# Patient Record
Sex: Male | Born: 1950 | Race: White | Hispanic: No | Smoking: Former smoker
Health system: Southern US, Community
[De-identification: ages and names within clinical notes are randomized; demographics above are authoritative.]

## PROBLEM LIST (undated history)

## (undated) DIAGNOSIS — R112 Nausea with vomiting, unspecified: Secondary | ICD-10-CM

## (undated) DIAGNOSIS — I4819 Other persistent atrial fibrillation: Secondary | ICD-10-CM

## (undated) DIAGNOSIS — R059 Cough, unspecified: Secondary | ICD-10-CM

## (undated) DIAGNOSIS — I25119 Atherosclerotic heart disease of native coronary artery with unspecified angina pectoris: Secondary | ICD-10-CM

## (undated) DIAGNOSIS — R9431 Abnormal electrocardiogram [ECG] [EKG]: Secondary | ICD-10-CM

## (undated) DIAGNOSIS — I5042 Chronic combined systolic (congestive) and diastolic (congestive) heart failure: Secondary | ICD-10-CM

## (undated) DIAGNOSIS — K219 Gastro-esophageal reflux disease without esophagitis: Secondary | ICD-10-CM

## (undated) DIAGNOSIS — E785 Hyperlipidemia, unspecified: Secondary | ICD-10-CM

## (undated) DIAGNOSIS — E669 Obesity, unspecified: Secondary | ICD-10-CM

## (undated) DIAGNOSIS — M1990 Unspecified osteoarthritis, unspecified site: Secondary | ICD-10-CM

## (undated) DIAGNOSIS — I255 Ischemic cardiomyopathy: Secondary | ICD-10-CM

## (undated) DIAGNOSIS — G459 Transient cerebral ischemic attack, unspecified: Secondary | ICD-10-CM

## (undated) DIAGNOSIS — G4733 Obstructive sleep apnea (adult) (pediatric): Secondary | ICD-10-CM

## (undated) DIAGNOSIS — R05 Cough: Secondary | ICD-10-CM

## (undated) DIAGNOSIS — I2510 Atherosclerotic heart disease of native coronary artery without angina pectoris: Secondary | ICD-10-CM

## (undated) DIAGNOSIS — Z7901 Long term (current) use of anticoagulants: Secondary | ICD-10-CM

## (undated) DIAGNOSIS — I714 Abdominal aortic aneurysm, without rupture: Secondary | ICD-10-CM

## (undated) DIAGNOSIS — Z9889 Other specified postprocedural states: Secondary | ICD-10-CM

## (undated) DIAGNOSIS — I119 Hypertensive heart disease without heart failure: Secondary | ICD-10-CM

## (undated) DIAGNOSIS — I10 Essential (primary) hypertension: Secondary | ICD-10-CM

## (undated) DIAGNOSIS — E032 Hypothyroidism due to medicaments and other exogenous substances: Secondary | ICD-10-CM

## (undated) DIAGNOSIS — Z951 Presence of aortocoronary bypass graft: Secondary | ICD-10-CM

## (undated) DIAGNOSIS — M109 Gout, unspecified: Secondary | ICD-10-CM

## (undated) DIAGNOSIS — I4821 Permanent atrial fibrillation: Secondary | ICD-10-CM

## (undated) DIAGNOSIS — F419 Anxiety disorder, unspecified: Secondary | ICD-10-CM

## (undated) DIAGNOSIS — N200 Calculus of kidney: Secondary | ICD-10-CM

## (undated) HISTORY — DX: Ischemic cardiomyopathy: I25.5

## (undated) HISTORY — PX: TRANSTHORACIC ECHOCARDIOGRAM: SHX275

## (undated) HISTORY — DX: Essential (primary) hypertension: I10

## (undated) HISTORY — DX: Atherosclerotic heart disease of native coronary artery without angina pectoris: I25.10

## (undated) HISTORY — DX: Hyperlipidemia, unspecified: E78.5

## (undated) HISTORY — DX: Chronic combined systolic (congestive) and diastolic (congestive) heart failure: I50.42

## (undated) HISTORY — PX: CATARACT EXTRACTION: SUR2

## (undated) HISTORY — DX: Permanent atrial fibrillation: I48.21

---
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
## 2004-07-18 ENCOUNTER — Emergency Department (HOSPITAL_COMMUNITY): Admission: EM | Admit: 2004-07-18 | Discharge: 2004-07-18 | Payer: Self-pay | Admitting: Emergency Medicine

## 2006-02-04 ENCOUNTER — Inpatient Hospital Stay (HOSPITAL_COMMUNITY): Admission: EM | Admit: 2006-02-04 | Discharge: 2006-02-07 | Payer: Self-pay | Admitting: Emergency Medicine

## 2006-02-06 ENCOUNTER — Encounter: Payer: Self-pay | Admitting: *Deleted

## 2006-02-21 ENCOUNTER — Ambulatory Visit: Payer: Self-pay | Admitting: Internal Medicine

## 2006-07-18 ENCOUNTER — Ambulatory Visit: Payer: Self-pay | Admitting: Internal Medicine

## 2006-07-19 ENCOUNTER — Ambulatory Visit: Payer: Self-pay | Admitting: Internal Medicine

## 2006-07-20 ENCOUNTER — Ambulatory Visit: Payer: Self-pay | Admitting: Internal Medicine

## 2006-07-25 ENCOUNTER — Ambulatory Visit: Payer: Self-pay | Admitting: Internal Medicine

## 2006-09-12 ENCOUNTER — Ambulatory Visit: Payer: Self-pay | Admitting: Internal Medicine

## 2006-09-12 LAB — CONVERTED CEMR LAB
ALT: 19 units/L (ref 0–40)
BUN: 12 mg/dL (ref 6–23)
Basophils Relative: 0.5 % (ref 0.0–1.0)
CO2: 25 meq/L (ref 19–32)
Calcium: 9.3 mg/dL (ref 8.4–10.5)
Creatinine, Ser: 1 mg/dL (ref 0.4–1.5)
Eosinophil percent: 2.3 % (ref 0.0–5.0)
GFR calc non Af Amer: 82 mL/min
Glomerular Filtration Rate, Af Am: 99 mL/min/{1.73_m2}
Glucose, Bld: 99 mg/dL (ref 70–99)
HDL: 36.5 mg/dL — ABNORMAL LOW (ref >39.0)
Hemoglobin: 14.6 g/dL (ref 13.0–17.0)
Lymphocytes Relative: 27.3 % (ref 12.0–46.0)
MCHC: 34.3 g/dL (ref 30.0–36.0)
Monocytes Absolute: 0.6 10*3/uL (ref 0.2–0.7)
Platelets: 273 10*3/uL (ref 150–400)
RBC: 4.89 M/uL (ref 4.22–5.81)
WBC: 6.8 10*3/uL (ref 4.5–10.5)

## 2007-05-03 ENCOUNTER — Emergency Department (HOSPITAL_COMMUNITY): Admission: EM | Admit: 2007-05-03 | Discharge: 2007-05-03 | Payer: Self-pay | Admitting: Emergency Medicine

## 2007-09-17 ENCOUNTER — Encounter: Payer: Self-pay | Admitting: Internal Medicine

## 2007-11-22 ENCOUNTER — Ambulatory Visit: Payer: Self-pay | Admitting: Internal Medicine

## 2007-11-22 DIAGNOSIS — I119 Hypertensive heart disease without heart failure: Secondary | ICD-10-CM

## 2007-11-22 DIAGNOSIS — K219 Gastro-esophageal reflux disease without esophagitis: Secondary | ICD-10-CM

## 2007-11-22 DIAGNOSIS — E785 Hyperlipidemia, unspecified: Secondary | ICD-10-CM | POA: Insufficient documentation

## 2007-11-22 HISTORY — DX: Gastro-esophageal reflux disease without esophagitis: K21.9

## 2007-11-23 ENCOUNTER — Ambulatory Visit: Payer: Self-pay | Admitting: *Deleted

## 2007-12-10 ENCOUNTER — Encounter (INDEPENDENT_AMBULATORY_CARE_PROVIDER_SITE_OTHER): Payer: Self-pay | Admitting: Internal Medicine

## 2008-01-04 ENCOUNTER — Ambulatory Visit: Payer: Self-pay | Admitting: Internal Medicine

## 2008-01-19 ENCOUNTER — Encounter (INDEPENDENT_AMBULATORY_CARE_PROVIDER_SITE_OTHER): Payer: Self-pay | Admitting: Internal Medicine

## 2011-04-22 NOTE — Assessment & Plan Note (Signed)
[facility] OFFICE NOTE   Charles Mckinney, Charles Mckinney                     MRN:          [id number]  DATE:07/18/2006                            DOB:          02-14-1951    Patient is seen today complaining of an enlarging, painful mass involving  his left mid back area.  He has had an infected sebaceous cyst in the past.   Examination revealed a golf-ball-sized inflammatory mass involving the left  mid back area, surrounded by a 10 cm diameter area of erythema.  After local  anesthesia with 1% Xylocaine and skin prep, an incision was made in the  central inflammatory mass of approximately 2 cm.  With blunt dissection,  this entire area was vigorously drained and decompressed.  A copious amount  of pus was expressed.  Iodoform packing was placed in the wound, and the  wound dressed after antibiotic ointment applied.   IMPRESSION:  Large infected sebaceous cyst abscess, left mid back area.   DISPOSITION:  The patient is to return in 24 hours to rebandage the area,  another 48 hours to remove the packing.                                   [name], MD   PFK/MedQ  DD:  07/18/2006  DT:  07/18/2006  Job #:  [id number]

## 2011-04-22 NOTE — Consult Note (Signed)
Charles Mckinney, [name] NO.:  [id number]   MEDICAL RECORD NO.:  [id number]          PATIENT TYPE:  INP   LOCATION:  1409                         FACILITY:  [facility]   PHYSICIAN:  Meade Maw, M.D.    DATE OF BIRTH:  1951/05/27   DATE OF CONSULTATION:  02/05/2006  DATE OF DISCHARGE:                                   CONSULTATION   REFERRED BY:  Jackie Plum, M.D.   INDICATIONS FOR PROCEDURE:  Chest pain.   HISTORY:  Charles Mckinney is a very pleasant 60 year old male who presented  to the emergency room on March 3 with complaints of chest pain. It was  described as a bandlike sensation around his upper abdomen and was severe in  nature. He had no associated symptoms. The patient was walking at the time.  He felt that it was related to indigestion and he tried a couple of drinks  of soda drink without significant improvement. The pain improved in the  emergency room with IV nitroglycerin and morphine. His coronary risk factors  are significant for male sex, hypertension, strong family history and father  passed of myocardial infarction at 47, brother had myocardial infarction at  age 4, sister with myocardial infarction at age 66. Of note, is brothers  and sisters were smokers. Cholesterol status is unknown at this time. The  patient has had no fevers or chills or shortness of breath or cough.   PAST MEDICAL HISTORY:  1.  Hypertension.  2.  Noncompliance secondary to financial issues.   ALLERGIES:  PENICILLIN.   PAST SURGICAL HISTORY:  None.   SOCIAL HISTORY:  The patient occasionally drinks alcohol, he is widowed, his  wife passed in August from myocardial infarction. He has older children. He  works as a Electrical engineer for [employer].   REVIEW OF SYSTEMS:  Negative for change in bowel or bladder habits. No  headaches, no cough, no fevers, no chills, no presyncope or syncope, no  palpitations, no tachy arrhythmias. No orthopnea, no pedal edema.   PHYSICAL EXAMINATION:  GENERAL:  Reveals a middle-aged male appearing his  stated age.  VITAL SIGNS:  His blood pressure initially was 201/121 at presentation. His  O2 sat at his initial presentation was 88%. His current vitals reveal a  blood pressure of 162/89, heart rate of 84. He is afebrile. His O2 sat on  room air is 95%, his weight is 301 pounds.  HEENT:  Unremarkable.  NECK:  He has good carotid upstrokes. There is no carotid bruits.  There is  no neck vein distention.  PULMONARY:  Reveals breath sounds which are equal and clear to auscultation.  No use of accessory muscles.  CARDIOVASCULAR:  Reveals a regular rate and rhythm. Normal S1, normal S2. No  rubs, murmurs or gallops noted.  ABDOMEN:  Morbidly obese, no unusual bruits or pulsations is noted.  EXTREMITIES:  Reveal no peripheral edema.   His chest x-ray was not acute.   LABORATORY DATA:  His white count was 7.8, hemoglobin 14.2, hematocrit 41,  his platelet count 291. His sodium is 139, potassium 3.6,  chloride 106, CO2  is 25, BUN 11, creatinine is 1.1. His serial cardiac enzymes have been  negative. His troponin I has been less than 1. His lipid profile reveals a  triglyceride of 393. Cholesterol, HDL is 33, LDL is 133. ECG reveals a  normal sinus rhythm. There is intraventricular conduction  delay consistent  with right bundle branch block. There is no ischemic changes noted.   IMPRESSION:  83.  A 60 year old gentleman with significant coronary risk factors as noted      above. He has had no further chest pain since his admission. It is most      likely that his chest pain was initiated by his hypertension. In view of      his risk factors, we will schedule for a stress Cardiolite for further      evaluation. I agree with your ongoing therapy at this time of aspirin at      325 mg.  2.  Hypertension. The patient currently is on Lopressor at 25 mg b.i.d. and      Altace at 2.5 mg daily. Blood pressure is marginally  controlled. Would      consider restarting lisinopril as the patient will not be able to afford      Altace as an outpatient.  3.  Dyslipidemia. The patient's triglycerides are significantly elevated. He      will need a nutritional consult for further management of his      hypertriglyceridemia and his morbid obesity.   Thank you for allowing me to participate in the care of your patient. I will  discuss the patient further with you.      Meade Maw, M.D.  Electronically Signed     HP/MEDQ  D:  02/05/2006  T:  02/06/2006  Job:  [id number]

## 2011-04-22 NOTE — H&P (Signed)
Charles Mckinney, Charles Mckinney              ACCOUNT NO.:  [id number]   MEDICAL RECORD NO.:  [id number]          PATIENT TYPE:  INP   LOCATION:  1409                         [facility]ACILITY:  [facility]   PHYSICIAN:  Jackie Plum, M.D.DATE OF BIRTH:  1951-09-21   DATE OF ADMISSION:  02/04/2006  DATE OF DISCHARGE:                                HISTORY & PHYSICAL   CHIEF COMPLAINT:  Chest pain.   HISTORY:  The patient is a 60 year old Caucasian gentleman with history of  obesity and hypertension and history  positive for heart disease in his  brother and father, who presented with band-like pain involving the upper  abdominal area substernally with nausea without vomiting. He has had no  diaphoresis or shortness of breath. No known aggravating factor. Apparently  he took some soda drink and there was no significant improvement. He has  never had chest pain or heart condition previously. He denies any history of  fever or chills, shortness of breath, cough, sputum production, ankle or  calf pain or swelling. No dysuria, frequency, micturition.   PAST MEDICAL HISTORY:  As noted above. The patient does not have a primary  care physician.   ALLERGIES:  PENICILLIN.   MEDICATIONS:  He is on no medication constantly. He used to be on a blood  pressure pill but has stopped the medication because of loss of  affordability.   SOCIAL HISTORY:  He drinks alcohol. Patient is a widower. He has older  children who do not live with him. 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   REVIEW OF SYSTEMS:  As noted above.   PHYSICAL EXAMINATION:  VITAL SIGNS:  Temperature was 98.4. Blood pressure  was up to 201/121 on presentation but has improved, at time of my evaluation  it has come down as low as 169/95. Pulse 83. Oxygen saturation on room air  88%.  GENERAL:  The patient was in acute cardiopulmonary distress. He was  moderately obese.  HEENT:  Normocephalic, atraumatic. Pupils are equal, round  and reactive to  light. Extraocular movements are intact. Oropharynx moist.  NECK:  Supple, No JVD. No carotid bruits.  LUNGS:  Clear to auscultation.  CARDIAC:  Regular, no gallop.  ABDOMEN:  Soft, bowel sounds present.  EXTREMITIES:  No clubbing, cyanosis or edema.   X-ray was negative for any edema or infiltrate. There was cardiomegaly. A 12-  lead EKG did not show any acute ST wave changes, showed sinus rhythm at 85  beats per minute.   LABORATORY DATA:  CBC was essentially within normal limits. Chemistries  positive for a glucose of 114, otherwise within normal limits. Normal  amylase and lipase. First set of enzymes was negative for myocardial  infarction.  Urinalysis was negative for urinary tract infection.   The patient is admitted for evaluation and management of chest pain.   PLAN:  Start patient on aspirin and low-dose beta blocker and titrate up for  tight blood pressure control for his elevated hypertension. The patient will  be made n.p.o. Anticipation of stress-test hopefully for Monday, February 06, 2006.  Dictated by Julio Sicks, M.D.      Jackie Plum, M.D.  Electronically Signed     GO/MEDQ  D:  02/05/2006  T:  02/05/2006  Job:  [id number]

## 2011-04-22 NOTE — Discharge Summary (Signed)
Charles Mckinney, Charles Mckinney              ACCOUNT NO.:  [id number]   MEDICAL RECORD NO.:  [id number]          PATIENT TYPE:  INP   LOCATION:  1409                         FACILITY:  [facility]   PHYSICIAN:  Kela Millin, M.D.DATE OF BIRTH:  04/27/1951   DATE OF ADMISSION:  02/04/2006  DATE OF DISCHARGE:  02/07/2006                                 DISCHARGE SUMMARY   DISCHARGE DIAGNOSES:  1.  Chest pain--likely secondary to uncontrolled hypertension, per      cardiology. Stress Cardiolite negative for ischemia.  2.  Malignant hypertension.  3.  Dyslipidemia.  4.  History of noncompliance.   CONSULTATIONS:  Cardiology--Dr. Fraser Din.   HISTORY:  The patient is a 60 year old male who presented with complaints of  chest pain. He described it as a band-like sensation around his upper  abdomen as very severe in intensity. He had no other associated symptoms. He  reported that he walking at that time. The patient stated that he thought  that it was related to indigestion and he tried a couple of drinks of soda  without any significant improvement. The pain improved in the ER after he  was put on IV nitroglycerin and morphine. It was noted that his risk factors  for cardiac disease were his male sex, hypertension, strong family history--  his father is deceased from an MI at age 23, brother had an MI at age 65,  sister had an MI at age 51. It was also noted that his brother and sisters  were smokers.   His physical exam upon admission is as per Dr. Julio Sicks revealed a blood  pressure of 201/121 at presentation and current was 162/89, pulse 84,  afebrile, O2 saturation 95%. In general, he was noted to be obese. His lung  was clear to auscultation bilaterally, with no use of accessory muscles.  Cardiovascular revealed a regular rate and rhythm with normal S1 and S2. No  murmur, S3, or gallops noted. His abdomen was obese, otherwise benign. The  rest of his physical exam was noted to be within  normal limits.   On the laboratory data his white blood cell count was 7.8, hemoglobin 14.2,  hematocrit 41, platelet count 291,000. Sodium 139, potassium 3.6, chloride  106, CO2 25, BUN 11, creatinine 1.1, and cardiac enzymes were negative. His  triglycerides were 393 with HDL of 33, LDL 133.   EKG revealed normal sinus rhythm with an interventricular conduction delay  consistent with right bundle branch block noted. No ischemic changes noted.   HOSPITAL COURSE:  Problem #1:  Chest pain--on admission the patient had  serial cardiac enzymes done. He was maintained on an aspirin as well as  Lopressor and Altace. Cardiology was consulted and Dr. Lindell Spar impression  was that his chest pain was most likely initiated by his poorly controlled  hypertension. A stress test was done and it showed an ejection fraction of  57% with no ischemia. The patient was discharged home on aspirin,  metoprolol, and Prilosec.   Problem #2:  Hypertension--the patient's blood pressure medications were  adjusted for better blood pressure control during his  hospital stay. His  blood pressure subsequently improved and he was discharged on metoprolol 50  mg daily.   Problem #3:  Dyslipidemia--nutrition was consulted during the patient's  hospital stay. His total cholesterol was 245. He was placed on Pravachol 20  mg p.o. q.h.s.   DISCHARGE MEDICATIONS:  1.  Aspirin 81 mg p.o. daily.  2.  Metoprolol 50 mg p.o. b.i.d.  3.  Pravachol 20 mg p.o. q.h.s.  4.  Prilosec 20 mg p.o. daily.   FOLLOWUP CARE:  Primary care physician in two to four weeks.   DISCHARGE CONDITION:  Improved/stable.      Kela Millin, M.D.  Electronically Signed     ACV/MEDQ  D:  05/03/2006  T:  05/03/2006  Job:  [id number]

## 2012-09-03 ENCOUNTER — Ambulatory Visit (INDEPENDENT_AMBULATORY_CARE_PROVIDER_SITE_OTHER): Payer: Self-pay | Admitting: Internal Medicine

## 2012-09-03 ENCOUNTER — Encounter: Payer: Self-pay | Admitting: Internal Medicine

## 2012-09-03 VITALS — BP 148/80 | HR 62 | Temp 98.5°F | Resp 18 | Ht 70.5 in | Wt 304.0 lb

## 2012-09-03 DIAGNOSIS — I219 Acute myocardial infarction, unspecified: Secondary | ICD-10-CM

## 2012-09-03 DIAGNOSIS — E6609 Other obesity due to excess calories: Secondary | ICD-10-CM

## 2012-09-03 DIAGNOSIS — E669 Obesity, unspecified: Secondary | ICD-10-CM

## 2012-09-03 DIAGNOSIS — Z0000 Encounter for general adult medical examination without abnormal findings: Secondary | ICD-10-CM

## 2012-09-03 MED ORDER — PRAVASTATIN SODIUM 40 MG PO TABS: 40.0000 mg | ORAL_TABLET | Freq: Every day | ORAL | Status: DC

## 2012-09-03 MED ORDER — METOPROLOL SUCCINATE ER 50 MG PO TB24: 50.0000 mg | ORAL_TABLET | Freq: Every day | ORAL | Status: DC

## 2012-09-03 MED ORDER — ASPIRIN 81 MG PO TBEC: 81.0000 mg | DELAYED_RELEASE_TABLET | Freq: Every day | ORAL | Status: DC

## 2012-09-03 NOTE — Patient Instructions (Signed)
Limit your sodium (Salt) intake    It is important that you exercise regularly, at least 20 minutes 3 to 4 times per week.  If you develop chest pain or shortness of breath seek  medical attention.  You need to lose weight.  Consider a lower calorie diet and regular exercise.  Schedule your colonoscopy to help detect colon cancer.  Return in 6 months for follow-up

## 2012-09-03 NOTE — Progress Notes (Signed)
Subjective:    Patient ID: Charles Mckinney, male    DOB: 08-Feb-1951, 61 y.o.   MRN: [id number]  HPI  61 year old patient who is seen today to establish with this practice. He is a former patient that has been followed at the [facility] hospital system. He has a history of hypertension and dyslipidemia. 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 Review of hospital records revealed that he had accelerated hypertension and has suspected angina but no MI. Subsequent stress test revealed no ischemia No concerns or complaints today. Past medical history is pertinent also for gastroesophageal reflux disease. He takes OTC Prilosec when necessary.  Family history father died age 54 of an MI mother died age 8 cup this is a diabetes and a cholecystectomy 2 brothers one status post CABG 1 status post multiple strokes and resides in a nursing facility positive for diabetes one sister in good health except for peripheral edema Social history widower since 2006 [place].an acute MI nonsmoker. Works Office manager for [employer]  Surgical history unremarkable no prior screening colonoscopies  Past Medical History  Diagnosis Date  . Hyperlipidemia   . Hypertension   . Myocardial infarction     History   Social History  . Marital Status: Widowed    Spouse Name: N/A    Number of Children: N/A  . Years of Education: N/A   Occupational History  . Not on file.   Social History Main Topics  . Smoking status: Former Games developer  . Smokeless tobacco: Never Used  . Alcohol Use: No  . Drug Use: No  . Sexually Active: Not on file   Other Topics Concern  . Not on file   Social History Narrative  . No narrative on file    History reviewed. No pertinent past surgical history.  Family History  Problem Relation Age of Onset  . Diabetes Mother   . Heart disease Father   . Stroke Brother   . Heart disease Brother   . Diabetes Brother     Allergies  Allergen Reactions  . Penicillins     Current  Outpatient Prescriptions on File Prior to Visit  Medication Sig Dispense Refill  . metoprolol succinate (TOPROL-XL) 50 MG 24 hr tablet Take 50 mg by mouth daily. Take with or immediately following a meal.      . PRAVASTATIN SODIUM PO Take by mouth.        BP 148/80  Pulse 62  Temp 98.5 F (36.9 C) (Oral)  Resp 18  Ht 5' 10.5" (1.791 m)  Wt 304 lb (137.893 kg)  BMI 43.00 kg/m2  SpO2 98%       Review of Systems  Constitutional: Negative for fever, chills, activity change, appetite change and fatigue.  HENT: Negative for hearing loss, ear pain, congestion, rhinorrhea, sneezing, mouth sores, trouble swallowing, neck pain, neck stiffness, dental problem, voice change, sinus pressure and tinnitus.   Eyes: Negative for photophobia, pain, redness and visual disturbance.  Respiratory: Negative for apnea, cough, choking, chest tightness, shortness of breath and wheezing.   Cardiovascular: Negative for chest pain, palpitations and leg swelling.  Gastrointestinal: Negative for nausea, vomiting, abdominal pain, diarrhea, constipation, blood in stool, abdominal distention, anal bleeding and rectal pain.  Genitourinary: Negative for dysuria, urgency, frequency, hematuria, flank pain, decreased urine volume, discharge, penile swelling, scrotal swelling, difficulty urinating, genital sores and testicular pain.  Musculoskeletal: Negative for myalgias, back pain, joint swelling, arthralgias and gait problem.  Skin: Negative for color  change, rash and wound.  Neurological: Negative for dizziness, tremors, seizures, syncope, facial asymmetry, speech difficulty, weakness, light-headedness, numbness and headaches.  Hematological: Negative for adenopathy. Does not bruise/bleed easily.  Psychiatric/Behavioral: Negative for suicidal ideas, hallucinations, behavioral problems, confusion, disturbed wake/sleep cycle, self-injury, dysphoric mood, decreased concentration and agitation. The patient is not  nervous/anxious.        Objective:   Physical Exam  Constitutional: He appears well-developed and well-nourished.       Weight 304 Blood pressure 140/80  HENT:  Head: Normocephalic and atraumatic.  Right Ear: External ear normal.  Left Ear: External ear normal.  Nose: Nose normal.  Mouth/Throat: Oropharynx is clear and moist.       Missing dentition  Eyes: Conjunctivae normal and EOM are normal. Pupils are equal, round, and reactive to light. No scleral icterus.  Neck: Normal range of motion. Neck supple. No JVD present. No thyromegaly present.  Cardiovascular: Regular rhythm, normal heart sounds and intact distal pulses.  Exam reveals no gallop and no friction rub.   No murmur heard. Pulmonary/Chest: Effort normal and breath sounds normal. He exhibits no tenderness.  Abdominal: Soft. Bowel sounds are normal. He exhibits no distension and no mass. There is no tenderness.       Umbilical hernia  Genitourinary: Prostate normal and penis normal.  Musculoskeletal: Normal range of motion. He exhibits edema. He exhibits no tenderness.       1+ edema  Lymphadenopathy:    He has no cervical adenopathy.  Neurological: He is alert. He has normal reflexes. No cranial nerve deficit. Coordination normal.  Skin: Skin is warm and dry. No rash noted.       Onychomycotic toenail changes  Psychiatric: He has a normal mood and affect. His behavior is normal.          Assessment & Plan:   Preventive health examination Dyslipidemia Hypertension Exogenous obesity Gastroesophageal reflux disease  Weight loss encouraged Medical records will be obtained Recheck in 6 months with updated lab No change in medical therapy

## 2012-09-04 ENCOUNTER — Telehealth: Payer: Self-pay | Admitting: Internal Medicine

## 2012-09-04 MED ORDER — METOPROLOL TARTRATE 50 MG PO TABS: 50.0000 mg | ORAL_TABLET | Freq: Every day | ORAL | Status: DC

## 2012-09-04 NOTE — Telephone Encounter (Signed)
Ok #[phone]

## 2012-09-04 NOTE — Telephone Encounter (Signed)
They are asking for plain metoprolol not XL - please advise

## 2012-09-04 NOTE — Telephone Encounter (Signed)
Pt called req to get his script for metoprolol succinate (TOPROL-XL) 50 MG 24 hr tablet, change to generic req metoprolol, because it is cheaper. [facility] on [address] Pt req that this be called in today. Call when done.

## 2013-03-04 ENCOUNTER — Ambulatory Visit: Payer: Self-pay | Admitting: Internal Medicine

## 2014-01-05 DIAGNOSIS — I5042 Chronic combined systolic (congestive) and diastolic (congestive) heart failure: Secondary | ICD-10-CM

## 2014-01-05 DIAGNOSIS — I255 Ischemic cardiomyopathy: Secondary | ICD-10-CM

## 2014-01-05 HISTORY — DX: Ischemic cardiomyopathy: I25.5

## 2014-01-05 HISTORY — DX: Chronic combined systolic (congestive) and diastolic (congestive) heart failure: I50.42

## 2014-01-31 ENCOUNTER — Encounter (HOSPITAL_COMMUNITY): Payer: Self-pay | Admitting: Emergency Medicine

## 2014-01-31 ENCOUNTER — Emergency Department (INDEPENDENT_AMBULATORY_CARE_PROVIDER_SITE_OTHER)
Admission: EM | Admit: 2014-01-31 | Discharge: 2014-01-31 | Disposition: A | Discharge status: Nursing Home (Includes ICF) | Payer: Self-pay | Source: Home / Self Care | Attending: Emergency Medicine | Admitting: Emergency Medicine

## 2014-01-31 ENCOUNTER — Emergency Department (HOSPITAL_COMMUNITY): Payer: Non-veteran care

## 2014-01-31 ENCOUNTER — Inpatient Hospital Stay (HOSPITAL_COMMUNITY)
Admission: EM | Admit: 2014-01-31 | Discharge: 2014-02-04 | DRG: 280 | Disposition: A | Discharge status: Home / Self Care | Payer: Non-veteran care | Attending: Cardiology | Admitting: Cardiology

## 2014-01-31 DIAGNOSIS — Z23 Encounter for immunization: Secondary | ICD-10-CM

## 2014-01-31 DIAGNOSIS — I2510 Atherosclerotic heart disease of native coronary artery without angina pectoris: Secondary | ICD-10-CM | POA: Diagnosis present

## 2014-01-31 DIAGNOSIS — I428 Other cardiomyopathies: Secondary | ICD-10-CM | POA: Diagnosis present

## 2014-01-31 DIAGNOSIS — I248 Other forms of acute ischemic heart disease: Secondary | ICD-10-CM | POA: Diagnosis present

## 2014-01-31 DIAGNOSIS — I4510 Unspecified right bundle-branch block: Secondary | ICD-10-CM | POA: Diagnosis present

## 2014-01-31 DIAGNOSIS — M1990 Unspecified osteoarthritis, unspecified site: Secondary | ICD-10-CM | POA: Diagnosis present

## 2014-01-31 DIAGNOSIS — Z7982 Long term (current) use of aspirin: Secondary | ICD-10-CM

## 2014-01-31 DIAGNOSIS — E785 Hyperlipidemia, unspecified: Secondary | ICD-10-CM | POA: Diagnosis present

## 2014-01-31 DIAGNOSIS — I119 Hypertensive heart disease without heart failure: Secondary | ICD-10-CM

## 2014-01-31 DIAGNOSIS — Z833 Family history of diabetes mellitus: Secondary | ICD-10-CM

## 2014-01-31 DIAGNOSIS — R0989 Other specified symptoms and signs involving the circulatory and respiratory systems: Secondary | ICD-10-CM

## 2014-01-31 DIAGNOSIS — Z880 Allergy status to penicillin: Secondary | ICD-10-CM

## 2014-01-31 DIAGNOSIS — Z6841 Body Mass Index (BMI) 40.0 and over, adult: Secondary | ICD-10-CM

## 2014-01-31 DIAGNOSIS — I5021 Acute systolic (congestive) heart failure: Secondary | ICD-10-CM

## 2014-01-31 DIAGNOSIS — E669 Obesity, unspecified: Secondary | ICD-10-CM | POA: Diagnosis present

## 2014-01-31 DIAGNOSIS — I252 Old myocardial infarction: Secondary | ICD-10-CM

## 2014-01-31 DIAGNOSIS — K219 Gastro-esophageal reflux disease without esophagitis: Secondary | ICD-10-CM | POA: Diagnosis present

## 2014-01-31 DIAGNOSIS — I5023 Acute on chronic systolic (congestive) heart failure: Secondary | ICD-10-CM | POA: Diagnosis present

## 2014-01-31 DIAGNOSIS — R0600 Dyspnea, unspecified: Secondary | ICD-10-CM

## 2014-01-31 DIAGNOSIS — Z87891 Personal history of nicotine dependence: Secondary | ICD-10-CM

## 2014-01-31 DIAGNOSIS — R0609 Other forms of dyspnea: Secondary | ICD-10-CM

## 2014-01-31 DIAGNOSIS — Z823 Family history of stroke: Secondary | ICD-10-CM

## 2014-01-31 DIAGNOSIS — I509 Heart failure, unspecified: Secondary | ICD-10-CM | POA: Diagnosis present

## 2014-01-31 DIAGNOSIS — I4891 Unspecified atrial fibrillation: Secondary | ICD-10-CM | POA: Diagnosis present

## 2014-01-31 DIAGNOSIS — Z8249 Family history of ischemic heart disease and other diseases of the circulatory system: Secondary | ICD-10-CM

## 2014-01-31 DIAGNOSIS — I2489 Other forms of acute ischemic heart disease: Secondary | ICD-10-CM | POA: Diagnosis present

## 2014-01-31 DIAGNOSIS — I214 Non-ST elevation (NSTEMI) myocardial infarction: Principal | ICD-10-CM | POA: Diagnosis present

## 2014-01-31 DIAGNOSIS — I110 Hypertensive heart disease with heart failure: Secondary | ICD-10-CM | POA: Diagnosis present

## 2014-01-31 HISTORY — DX: Gastro-esophageal reflux disease without esophagitis: K21.9

## 2014-01-31 HISTORY — DX: Obesity, unspecified: E66.9

## 2014-01-31 HISTORY — DX: Calculus of kidney: N20.0

## 2014-01-31 HISTORY — DX: Unspecified osteoarthritis, unspecified site: M19.90

## 2014-01-31 HISTORY — DX: Hypertensive heart disease without heart failure: I11.9

## 2014-01-31 LAB — CBC WITH DIFFERENTIAL/PLATELET
BASOS ABS: 0 10*3/uL (ref 0.0–0.1)
BASOS PCT: 0 % (ref 0–1)
EOS ABS: 0.1 10*3/uL (ref 0.0–0.7)
Eosinophils Relative: 1 % (ref 0–5)
HCT: 37.9 % — ABNORMAL LOW (ref 39.0–52.0)
Hemoglobin: 13.1 g/dL (ref 13.0–17.0)
Lymphocytes Relative: 11 % — ABNORMAL LOW (ref 12–46)
Lymphs Abs: 1.2 10*3/uL (ref 0.7–4.0)
MCH: 30 pg (ref 26.0–34.0)
MCHC: 34.6 g/dL (ref 30.0–36.0)
MCV: 86.7 fL (ref 78.0–100.0)
Monocytes Absolute: 0.7 10*3/uL (ref 0.1–1.0)
Monocytes Relative: 7 % (ref 3–12)
NEUTROS ABS: 8.2 10*3/uL — AB (ref 1.7–7.7)
Neutrophils Relative %: 80 % — ABNORMAL HIGH (ref 43–77)
PLATELETS: 245 10*3/uL (ref 150–400)
RBC: 4.37 MIL/uL (ref 4.22–5.81)
RDW: 13.6 % (ref 11.5–15.5)
WBC: 10.3 10*3/uL (ref 4.0–10.5)

## 2014-01-31 LAB — BASIC METABOLIC PANEL
BUN: 15 mg/dL (ref 6–23)
CHLORIDE: 107 meq/L (ref 96–112)
CO2: 22 mEq/L (ref 19–32)
Calcium: 8.5 mg/dL (ref 8.4–10.5)
Creatinine, Ser: 0.99 mg/dL (ref 0.50–1.35)
GFR calc Af Amer: >90 mL/min (ref >90)
GFR, EST NON AFRICAN AMERICAN: 85 mL/min — AB (ref >90)
Glucose, Bld: 123 mg/dL — ABNORMAL HIGH (ref 70–99)
POTASSIUM: 2.9 meq/L — AB (ref 3.7–5.3)
Sodium: 144 mEq/L (ref 137–147)

## 2014-01-31 LAB — I-STAT TROPONIN, ED: Troponin i, poc: 1.64 ng/mL (ref 0.00–0.08)

## 2014-01-31 LAB — MRSA PCR SCREENING: MRSA by PCR: NEGATIVE

## 2014-01-31 LAB — GLUCOSE, CAPILLARY: GLUCOSE-CAPILLARY: 143 mg/dL — AB (ref 70–99)

## 2014-01-31 LAB — PRO B NATRIURETIC PEPTIDE: PRO B NATRI PEPTIDE: 2718 pg/mL — AB (ref 0–125)

## 2014-01-31 LAB — D-DIMER, QUANTITATIVE (NOT AT ARMC): D-Dimer, Quant: 0.28 ug/mL-FEU (ref 0.00–0.48)

## 2014-01-31 LAB — TROPONIN I: Troponin I: 2.11 ng/mL (ref <0.30)

## 2014-01-31 MED ORDER — NITROGLYCERIN 0.4 MG SL SUBL
SUBLINGUAL_TABLET | SUBLINGUAL | Status: AC
  Filled 2014-01-31: qty 25

## 2014-01-31 MED ORDER — ASPIRIN 81 MG PO CHEW: 324.0000 mg | CHEWABLE_TABLET | Freq: Once | ORAL | Status: DC

## 2014-01-31 MED ORDER — FUROSEMIDE 10 MG/ML IJ SOLN
40.0000 mg | Freq: Once | INTRAMUSCULAR | Status: AC
  Administered 2014-01-31: 40 mg via INTRAVENOUS
  Filled 2014-01-31: qty 4

## 2014-01-31 MED ORDER — NITROGLYCERIN IN D5W 200-5 MCG/ML-% IV SOLN: 5.0000 ug/min | INTRAVENOUS | Status: DC

## 2014-01-31 MED ORDER — SODIUM CHLORIDE 0.9 % IJ SOLN
3.0000 mL | Freq: Two times a day (BID) | INTRAMUSCULAR | Status: DC
  Administered 2014-02-01 – 2014-02-03 (×4): 3 mL via INTRAVENOUS

## 2014-01-31 MED ORDER — HEPARIN (PORCINE) IN NACL 100-0.45 UNIT/ML-% IJ SOLN: 12.0000 [IU]/kg/h | Freq: Once | INTRAMUSCULAR | Status: DC

## 2014-01-31 MED ORDER — POTASSIUM CHLORIDE 20 MEQ/15ML (10%) PO LIQD: 40.0000 meq | Freq: Once | ORAL | Status: DC

## 2014-01-31 MED ORDER — POTASSIUM CHLORIDE 20 MEQ PO PACK
40.0000 meq | PACK | Freq: Once | ORAL | Status: DC
  Filled 2014-01-31: qty 2

## 2014-01-31 MED ORDER — METOPROLOL TARTRATE 50 MG PO TABS
50.0000 mg | ORAL_TABLET | Freq: Two times a day (BID) | ORAL | Status: DC
  Administered 2014-01-31 – 2014-02-01 (×2): 50 mg via ORAL
  Filled 2014-01-31 (×3): qty 1

## 2014-01-31 MED ORDER — NITROGLYCERIN 0.4 MG SL SUBL: 0.4000 mg | SUBLINGUAL_TABLET | SUBLINGUAL | Status: DC | PRN

## 2014-01-31 MED ORDER — SODIUM CHLORIDE 0.9 % IV SOLN
INTRAVENOUS | Status: DC
  Administered 2014-01-31: 17:00:00 via INTRAVENOUS

## 2014-01-31 MED ORDER — SODIUM CHLORIDE 0.9 % IV SOLN: 250.0000 mL | INTRAVENOUS | Status: DC | PRN

## 2014-01-31 MED ORDER — POTASSIUM CHLORIDE 20 MEQ/15ML (10%) PO LIQD
40.0000 meq | Freq: Four times a day (QID) | ORAL | Status: AC
  Administered 2014-01-31 – 2014-02-01 (×2): 40 meq via ORAL
  Filled 2014-01-31 (×2): qty 30

## 2014-01-31 MED ORDER — ASPIRIN EC 81 MG PO TBEC: 81.0000 mg | DELAYED_RELEASE_TABLET | Freq: Every day | ORAL | Status: DC

## 2014-01-31 MED ORDER — HEPARIN SODIUM (PORCINE) 5000 UNIT/ML IJ SOLN
4000.0000 [IU] | Freq: Once | INTRAMUSCULAR | Status: AC
  Administered 2014-01-31: 4000 [IU] via INTRAVENOUS

## 2014-01-31 MED ORDER — SODIUM CHLORIDE 0.9 % IJ SOLN: 3.0000 mL | INTRAMUSCULAR | Status: DC | PRN

## 2014-01-31 MED ORDER — HEPARIN (PORCINE) IN NACL 100-0.45 UNIT/ML-% IJ SOLN
2100.0000 [IU]/h | INTRAMUSCULAR | Status: DC
  Administered 2014-01-31: 1550 [IU]/h via INTRAVENOUS
  Administered 2014-02-01 – 2014-02-02 (×3): 2100 [IU]/h via INTRAVENOUS
  Filled 2014-01-31 (×11): qty 250

## 2014-01-31 MED ORDER — ASPIRIN 81 MG PO TBEC: 81.0000 mg | DELAYED_RELEASE_TABLET | Freq: Every day | ORAL | Status: DC

## 2014-01-31 MED ORDER — NITROGLYCERIN IN D5W 200-5 MCG/ML-% IV SOLN
5.0000 ug/min | Freq: Once | INTRAVENOUS | Status: AC
  Administered 2014-01-31: 5 ug/min via INTRAVENOUS
  Filled 2014-01-31: qty 250

## 2014-01-31 MED ORDER — POTASSIUM CHLORIDE 10 MEQ/100ML IV SOLN
10.0000 meq | Freq: Once | INTRAVENOUS | Status: AC
  Administered 2014-01-31: 10 meq via INTRAVENOUS
  Filled 2014-01-31: qty 100

## 2014-01-31 MED ORDER — ATORVASTATIN CALCIUM 80 MG PO TABS
80.0000 mg | ORAL_TABLET | Freq: Every day | ORAL | Status: DC
  Administered 2014-02-02 – 2014-02-03 (×3): 80 mg via ORAL
  Filled 2014-01-31 (×4): qty 1

## 2014-01-31 MED ORDER — NITROGLYCERIN 0.4 MG SL SUBL
0.4000 mg | SUBLINGUAL_TABLET | SUBLINGUAL | Status: AC | PRN
  Administered 2014-01-31: 0.4 mg via SUBLINGUAL

## 2014-01-31 MED ORDER — ASPIRIN EC 81 MG PO TBEC
81.0000 mg | DELAYED_RELEASE_TABLET | Freq: Every day | ORAL | Status: DC
  Administered 2014-01-31 – 2014-02-03 (×4): 81 mg via ORAL
  Filled 2014-01-31 (×5): qty 1

## 2014-01-31 MED ORDER — ASPIRIN 81 MG PO CHEW
324.0000 mg | CHEWABLE_TABLET | Freq: Once | ORAL | Status: AC
  Administered 2014-01-31: 324 mg via ORAL

## 2014-01-31 MED ORDER — ASPIRIN 81 MG PO CHEW
CHEWABLE_TABLET | ORAL | Status: AC
  Filled 2014-01-31: qty 4

## 2014-01-31 NOTE — ED Notes (Signed)
Per EMS pt came from UC c/o SOB and tightness in the right side of chest. UC gave 1 SL nitroglycerin tablet and placed pt on 2L [place] that decreased pain to 0/10 and improved SOB. Pt reports CP started due to SOB.

## 2014-01-31 NOTE — Progress Notes (Signed)
ANTICOAGULATION CONSULT NOTE - Initial Consult  Pharmacy Consult for Heparin Indication: chest pain/ACS  Allergies  Allergen Reactions  . Penicillins Other (See Comments)    unknown    Patient Measurements: Height: 5\' 11"  (180.3 cm) Weight: 317 lb (143.79 kg) IBW/kg (Calculated) : 75.3 Heparin Dosing Weight:  109 kg  Vital Signs: Temp: 98.3 F (36.8 C) (02/27 1715) Temp src: Oral (02/27 1715) BP: 134/70 mmHg (02/27 1715) Pulse Rate: 82 (02/27 1715)  Labs:  Recent Labs  01/31/14 1726  HGB 13.1  HCT 37.9*  PLT 245    Estimated Creatinine Clearance: 109.8 ml/min (by C-G formula based on Cr of 1).   Medical History: Past Medical History  Diagnosis Date  . Hyperlipidemia   . Hypertension   . Myocardial infarction     Medications:   ASA81mg , Dramamine, Loperamide, metoprolol, NTG SL, Prvachol  Assessment: SOB/cP upon wakening with worsening throughout the day. Patient had symptom of GI virus 5-6 days ago. Patient was transferred to [facility] ED from Urgent Care and found to have elevated troponin. Heparin dosing weight 109kg.  PMH: HTN, HLD, MI '07  Labs. Hgb 13.1. Plts 245. Troponin 1.64  Goal of Therapy:  Heparin level 0.3-0.7 units/ml Monitor platelets by anticoagulation protocol: Yes   Plan:   Heparin 4000 unit IV bolus Heparin infusion 1550 unit/hr Check heparin level in 6-8 hrs and daily.  Terell Kincy S. Merilynn Finland, PharmD, BCPS Clinical Staff Pharmacist Pager [phone]  Misty Stanley Stillinger 01/31/2014,5:53 PM

## 2014-01-31 NOTE — Discharge Instructions (Signed)
We have determined that your problem requires further evaluation in the emergency department.  We will take care of your transport there.  Once at the emergency department, you will be evaluated by a provider and they will order whatever treatment or tests they deem necessary.  We cannot guarantee that they will do any specific test or do any specific treatment.  ° °

## 2014-01-31 NOTE — ED Provider Notes (Signed)
CSN: [id number]     Arrival date & time 01/31/14  1709 History   First MD Initiated Contact with Patient 01/31/14 1712     Chief Complaint  Patient presents with  . Shortness of Breath  . Chest Pain   HPI Comments: 63 yo M hx of HTN, HLD, self-reported MI 2007, presents with CC of dyspnea.  Symptoms started 1-2 PM when pt woke from sleep.  Gradually worsening dyspnea, worse with exertion, with bilateral and central chest tightness, nonradiating, which also worsens with exertion.  Associated lightheadedness, diaphoresis, productive cough with yellow phlegm.  Denies fever, chills, abdominal pain, nausea, vomiting, diarrhea, constipation, back pain, myalgias, rash, or any other symptoms today.  Pt went to urgent care, and was acutely SOB, c/o chest tightness, satting normally on RA.  EKG there showed RBBB with lateral T-wave inversions.  Concern for unstable angina and pt was sent to ED for further evaluation.  Pt received NG, ASA, and transported to ED.  Of note pt states he has been ill recently, with n/v/d, subjective fever/chills this weekend, with complete resolution of these symptoms by Wednesday.    Patient is a 63 y.o. male presenting with shortness of breath and chest pain. The history is provided by the patient and the EMS personnel. No language interpreter was used.  Shortness of Breath Severity:  Moderate Onset quality:  Gradual Duration:  5 hours Timing:  Constant Progression:  Worsening Chronicity:  New Context: activity   Context: not animal exposure, not emotional upset, not fumes, not known allergens, not occupational exposure, not pollens, not smoke exposure, not strong odors, not URI and not weather changes   Relieved by:  Oxygen and rest Worsened by:  Activity and exertion Ineffective treatments:  None tried Associated symptoms: chest pain and cough   Associated symptoms: no abdominal pain, no fever, no headaches, no rash, no vomiting and no wheezing   Risk factors: obesity    Risk factors: no recent alcohol use, no family hx of DVT, no hx of cancer, no hx of PE/DVT, no oral contraceptive use, no prolonged immobilization, no recent surgery and no tobacco use   Risk factors comment:  FMHx of premature CAD Chest Pain Associated symptoms: cough and shortness of breath   Associated symptoms: no abdominal pain, no dizziness, no fever, no headache, no nausea, no numbness, no palpitations, not vomiting and no weakness     Past Medical History  Diagnosis Date  . Hyperlipidemia   . Hypertension   . Myocardial infarction    No past surgical history on file. Family History  Problem Relation Age of Onset  . Diabetes Mother   . Heart disease Father   . Stroke Brother   . Heart disease Brother   . Diabetes Brother    History  Substance Use Topics  . Smoking status: Former Games developer  . Smokeless tobacco: Never Used  . Alcohol Use: No    Review of Systems  Constitutional: Negative for fever and chills.  Respiratory: Positive for cough and shortness of breath. Negative for wheezing and stridor.   Cardiovascular: Positive for chest pain. Negative for palpitations and leg swelling.  Gastrointestinal: Negative for nausea, vomiting, abdominal pain, diarrhea and constipation.  Musculoskeletal: Negative for myalgias.  Skin: Negative for rash.  Neurological: Positive for light-headedness. Negative for dizziness, seizures, weakness, numbness and headaches.  Hematological: Negative for adenopathy. Does not bruise/bleed easily.  All other systems reviewed and are negative.      Allergies  Penicillins  Home  Medications   Current Outpatient Rx  Name  Route  Sig  Dispense  Refill  . aspirin 81 MG EC tablet   Oral   Take 1 tablet (81 mg total) by mouth daily. Swallow whole.   30 tablet   12   . metoprolol (LOPRESSOR) 50 MG tablet   Oral   Take 1 tablet (50 mg total) by mouth daily.   90 tablet   3   . pravastatin (PRAVACHOL) 40 MG tablet   Oral   Take 1  tablet (40 mg total) by mouth daily.   90 tablet   3   . aspirin 81 MG chewable tablet   Oral   Chew 324 mg by mouth once.         . nitroGLYCERIN (NITROSTAT) 0.4 MG SL tablet   Sublingual   Place 0.4 mg under the tongue every 5 (five) minutes as needed for chest pain.          BP 134/70  Pulse 82  Temp(Src) 98.3 F (36.8 C) (Oral)  Resp 21  Ht 5\' 11"  (1.803 m)  Wt 317 lb (143.79 kg)  BMI 44.23 kg/m2  SpO2 95% Physical Exam  Nursing note and vitals reviewed. Constitutional: He is oriented to person, place, and time. He appears well-developed and well-nourished.  HENT:  Head: Normocephalic and atraumatic.  Right Ear: External ear normal.  Left Ear: External ear normal.  Mouth/Throat: Oropharynx is clear and moist.  Eyes: Conjunctivae and EOM are normal. Pupils are equal, round, and reactive to light.  Neck: Normal range of motion. Neck supple.  Cardiovascular: Normal rate, regular rhythm, normal heart sounds and intact distal pulses.   2+ pitting edema bilateral lower extremities.  Pulmonary/Chest: Effort normal. No respiratory distress. He has no wheezes. He has no rales. He exhibits no tenderness.  Diminished breath sounds bilaterally.  Abdominal: Soft. Bowel sounds are normal. He exhibits no distension and no mass. There is no tenderness. There is no rebound and no guarding.  Musculoskeletal: Normal range of motion.  Neurological: He is alert and oriented to person, place, and time.  Skin: Skin is warm and dry.    ED Course  Procedures (including critical care time) Labs Review Labs Reviewed  PRO B NATRIURETIC PEPTIDE - Abnormal; Notable for the following:    Pro B Natriuretic peptide (BNP) 2718.0 (*)    All other components within normal limits  CBC WITH DIFFERENTIAL - Abnormal; Notable for the following:    HCT 37.9 (*)    Neutrophils Relative % 80 (*)    Neutro Abs 8.2 (*)    Lymphocytes Relative 11 (*)    All other components within normal limits   BASIC METABOLIC PANEL - Abnormal; Notable for the following:    Potassium 2.9 (*)    Glucose, Bld 123 (*)    GFR calc non Af Amer 85 (*)    All other components within normal limits  TROPONIN I - Abnormal; Notable for the following:    Troponin I 2.11 (*)    All other components within normal limits  I-STAT TROPOININ, ED - Abnormal; Notable for the following:    Troponin i, poc 1.64 (*)    All other components within normal limits  MRSA PCR SCREENING  D-DIMER, QUANTITATIVE  HEPARIN LEVEL (UNFRACTIONATED)  HEPARIN LEVEL (UNFRACTIONATED)  CBC  BASIC METABOLIC PANEL  PROTIME-INR  APTT  TSH  COMPREHENSIVE METABOLIC PANEL  HEMOGLOBIN A1C  LIPID PANEL  TROPONIN I  TROPONIN I  Imaging Review Dg Chest 2 View  01/31/2014   CLINICAL DATA:  Wheezing, difficulty breathing  EXAM: CHEST  2 VIEW  COMPARISON:  02/04/2006  FINDINGS: Borderline cardiomegaly. There is is perihilar increased bronchial and interstitial markings suspicious for edema, pneumonitis or reactive airway disease. Question small left pleural effusion.  IMPRESSION: Bilateral perihilar and lung bases increased bronchial interstitial markings suspicious for edema, pneumonitis or reactive airway disease. Question small left pleural effusion.   Electronically Signed   By: Natasha Mead M.D.   On: 01/31/2014 18:08    EKG Interpretation  None  MDM   Final diagnoses:  None   63 yo M hx of HTN, HLD, self-reported MI 2007, presents with CC of dyspnea.  Filed Vitals:   01/31/14 1715  BP: 134/70  Pulse: 82  Temp: 98.3 F (36.8 C)  Resp: 21   Physical exam as above.  EKG HR 78, PR 169, QRS 140, QT/QTc 392/447, RBBB, lateral/inferior T wave inversions, increased amplitude RBBB compared to prior EKG.  Troponin 1.64.  CXR demonstrate.  Diagnosis of NSTEMI, CHF exacerbation.  Heparin bolus, infusion started, Lasix IV, potassium supplementation, and low dose nitro gtt initiated.  Pt has already received ASA, NG by urgent care,  with improvement in symptoms.  Cardiology consulted for admission.  Pt understands and agrees with plan.  Pt's care plan discussed with Dr. Fayrene Fearing.  Jon Gills, MD     Jon Gills, MD 02/01/14 [phone]

## 2014-01-31 NOTE — ED Notes (Signed)
Pt reports waking with sob this a.m around 1. Light headed.  Mild cough.  No hx of asthma or smoking hx.  States had episode of v/d Saturday through Monday.  Denies chest pain and any other symptoms.

## 2014-01-31 NOTE — ED Provider Notes (Signed)
Chief Complaint   Chief Complaint  Patient presents with  . Shortness of Breath    History of Present Illness   Charles Mckinney is a 63 year old male who upon awakening today at 1:30 PM no shortness of breath. This got worse throughout the day and was worse with any kind of activity. His chest felt tight be denies any pain or pressure in the chest. He has broken out in a sweat. He's had a dry cough. He denies any fever, chills, or nausea. The patient states he's had a history of an MI, although I cannot find a record of this in his chart. He was admitted in 2007 for some abdominal discomfort, but apparently at that time MI was ruled out. I cannot find any old EKGs for him in the medical record.  Review of Systems   Other than as noted above, the patient denies any of the following symptoms. Systemic:  No fever, chills, sweats, or fatigue. ENT:  No nasal congestion, rhinorrhea, or sore throat. Pulmonary:  No cough, wheezing, sputum production, hemoptysis. Cardiac:  No chest pain, palpitations, rapid heartbeat, dizziness, presyncope or syncope. Skin:  No rash or itching. Ext:  No leg pain or swelling. Psych:  No anxiety or depression.  PMFSH   Past medical history, family history, social history, meds, and allergies were reviewed and updated as needed. The patient denies any history of asthma, lung disease, heart disease, anxiety, or tobacco use. He is a former smoker. He has high blood pressure. He's allergic to penicillin. He takes Toprol and pravastatin.  Physical Examination    Vital signs:  BP 139/94  Pulse 88  Resp 40  SpO2 95% Gen:  Alert, oriented, in no distress, skin warm and diaphoretic. Eye:  PERRL, lids and conjunctivas normal.  Sclera non-icteric. ENT:  Mucous membranes moist, pharynx clear. Neck:  Supple, no adenopathy or tenderness.  No JVD. Lungs:  Breath sounds are distant and he was tachypneic, but they're no wheezes, rales, rhonchi. Heart:  Regular rhythm.   No gallops, murmers, clicks or rubs. Abdomen:  Soft, nontender, no organomegaly or mass.  Bowel sounds normal.  No pulsatile abdominal mass or bruit. Ext:  No edema.  No calf tenderness and Homann's sign negative.  Pulses full and equal. Skin:  Warm and dry.  No rash.  Labs   Results for orders placed during the hospital encounter of 01/31/14  GLUCOSE, CAPILLARY      Result Value Ref Range   Glucose-Capillary 143 (*) 70 - 99 mg/dL   Electrocardiogram    Date: 01/31/2014  Rate: 80  Rhythm: normal sinus rhythm  QRS Axis: left  Intervals: normal  ST/T Wave abnormalities: nonspecific T wave changes  Conduction Disutrbances:right bundle branch block  Narrative Interpretation: Undetermined rhythm, left axis deviation, right bundle branch block, T-wave abnormality consistent with lateral ischemia.  Old EKG Reviewed: none available  Course in Urgent Care Center   He was placed on monitor, IV normal saline was started, he was given nasal O2, and nitroglycerin 0.4 mg sublingually, and aspirin 325 mg by mouth.  Assessment   The encounter diagnosis was Dyspnea.  Differential diagnosis includes congestive heart failure, COPD, or myocardial ischemia.  Plan    The patient was transferred to the ED via [facility] in stable condition.  Medical Decision Making    63 year old with history of CAD, HT, and elevated cholesterol has had shortness of breath since he woke up today at 1:30 p.m.  His chest feels  tight, but he has no chest pain.  No ankle edema.  His EKG shows T wave abnormalities consistent with lateral ischemia.   We have started O2, monitor, IV NS and given TNG and ASA.  Will transport via [facility].       Reuben Likesavid C Toula Miyasaki, MD 01/31/14 [phone]

## 2014-01-31 NOTE — ED Notes (Signed)
Dr. Fayrene Fearing notified of eleveated troponin

## 2014-01-31 NOTE — H&P (Signed)
History and Physical   Admit date: 01/31/2014 Name:  Charles Mckinney Medical record number: [id number] DOB/Age:  1951-10-15  63 y.o. male  Referring Physician:   [facility]  Chief complaint/reason for admission: Shortness of breath  HPI:  This 62 year old male has a history of morbid obesity, hypertension and hyperlipidemia. He had admission to the hospital number of years ago and had a stress Cardiolite during that admission showing an ejection fraction 57% with no ischemia. He has been seen sporadically here in town but gets care only episodically at the [place]. He was last seen there about a year ago and had his medicines filled and then recently he twisted his right ankle and has been somewhat sedentary. He works third shift as a Electrical engineer and has not been able to walk much since twisting his ankle. Earlier this week he developed nausea vomiting and significant diarrhea and had this through Wednesday. He continued to work at night and worked last night. He came home and went to bed and woke up around 1:30 and was mildly short of breath. He then went out to the mailbox and became severely short of breath and developed severe dyspnea with both the level of activity. Because of the severe dyspnea with any activity he went to urgent care where he was found to have a right bundle branch block and lateral T-wave changes and was sent to the emergency room. Troponin was mildly elevated. The patient denied having any chest pain or tightness and stated earlier this week he was able to play with his dog and walk around without symptoms. He does not have PND orthopnea. He may have had an episode of some chest discomfort a while back when he breathes in cold air.    Past Medical History  Diagnosis Date  . Hyperlipidemia   . Hypertensive heart disease   . Obesity (BMI 30-39.9)   . GERD (gastroesophageal reflux disease)   . Kidney stones   . Osteoarthritis        Past Surgical History   Procedure Laterality Date  . Cataract extraction    .  Allergies: is allergic to penicillins.   Medications: Prior to Admission medications   Medication Sig Start Date End Date Taking? Authorizing Provider  aspirin 81 MG EC tablet Take 1 tablet (81 mg total) by mouth daily. Swallow whole. 09/03/12  Yes [name], MD  dimenhyDRINATE (DRAMAMINE) 50 MG tablet Take 50 mg by mouth every 8 (eight) hours as needed for nausea or dizziness.    Yes Historical Provider, MD  loperamide (IMODIUM A-D) 2 MG tablet Take 2 mg by mouth 4 (four) times daily as needed for diarrhea or loose stools.   Yes Historical Provider, MD  metoprolol (LOPRESSOR) 50 MG tablet Take 1 tablet (50 mg total) by mouth daily. 09/04/12  Yes [name], MD  pravastatin (PRAVACHOL) 40 MG tablet Take 1 tablet (40 mg total) by mouth daily. 09/03/12  Yes [name], MD  aspirin 81 MG chewable tablet Chew 324 mg by mouth once.    Historical Provider, MD  nitroGLYCERIN (NITROSTAT) 0.4 MG SL tablet Place 0.4 mg under the tongue every 5 (five) minutes as needed for chest pain.    Historical Provider, MD    Family History:  Family Status  Relation Status Death Age  . Mother Deceased 72    complications of surgery  . Father Deceased 56    died of MI  . Sister Alive  CHF  . Brother Alive     CABG  . Brother Alive     history of CVA, [place]    Social History:   reports that he has quit smoking. He has never used smokeless tobacco. He reports that he does not drink alcohol or use illicit drugs.   History   Social History Narrative  . No narrative on file     Review of Systems:  He has been obese for many years Other than as noted above, the remainder of the review of systems is normal  Physical Exam: BP 134/70  Pulse 82  Temp(Src) 98.3 F (36.8 C) (Oral)  Resp 21  Ht 5\' 11"  (1.803 m)  Wt 143.79 kg (317 lb)  BMI 44.23 kg/m2  SpO2 95% General appearance: Morbidly obese talkative  white male in no acute distress Head: Normocephalic, without obvious abnormality, atraumatic Eyes: conjunctivae/corneas clear. PERRL, EOM's intact. Fundi benign. Neck: no adenopathy, no carotid bruit, supple, symmetrical, trachea midline and Neck veins are difficult to assess due to his obesity Lungs: clear to auscultation bilaterally Heart: regular rate and rhythm, S1, S2 normal, no murmur, click, rub or gallop Abdomen: soft, non-tender; bowel sounds normal; no masses,  no organomegaly Rectal: deferred Extremities: Legs are obese, 1-2+ edema noted Pulses: 2+ and symmetric Skin: Skin color, texture, turgor normal. No rashes or lesions Neurologic: Grossly normal  Labs: CBC  Recent Labs  01/31/14 1726  WBC 10.3  RBC 4.37  HGB 13.1  HCT 37.9*  PLT 245  MCV 86.7  MCH 30.0  MCHC 34.6  RDW 13.6  LYMPHSABS 1.2  MONOABS 0.7  EOSABS 0.1  BASOSABS 0.0   CMP   Recent Labs  01/31/14 1726  NA 144  K 2.9*  CL 107  CO2 22  GLUCOSE 123*  BUN 15  CREATININE 0.99  CALCIUM 8.5  GFRNONAA 85*  GFRAA >90   BNP (last 3 results)  Recent Labs  01/31/14 1726  PROBNP 2718.0*   Cardiac Panel (last 3 results) Troponin (Point of Care Test)  Recent Labs  01/31/14 1742  TROPIPOC 1.64*   EKG: Sinus rhythm, right bundle branch block, possible old anteroseptal infarction, lateral T-wave inversions consistent with ischemia  Radiology: Borderline cardiomegaly, prominent interstitial markings   IMPRESSIONS: 1. Acute dyspnea compatible with acute congestive heart failure 2. Nonspecific elevation of troponin of uncertain cause 3. Hypertensive heart disease 4. Morbid obesity 5. Hyperlipidemia 6. Recent viral gastroenteritis  PLAN:  Admit to telemetry. Cycle troponins. For now cover her with heparin to be sure this is not an atypical presentation of unstable angina. Obtain echocardiogram to assess LV function, replete potassium. Because of his foot injury and his immobilization  check d-dimer to to be sure he is not had a pulmonary embolus. Further evaluation will depend on clinical course and his enzymes. He very much wishes to have [facility].  Signed: Darden PalmerW. Spencer Lynett Brasil, Jr. MD [facility]  01/31/2014, 7:50 PM

## 2014-01-31 NOTE — ED Notes (Signed)
Cardiology at bedside.

## 2014-02-01 DIAGNOSIS — I509 Heart failure, unspecified: Secondary | ICD-10-CM

## 2014-02-01 DIAGNOSIS — I119 Hypertensive heart disease without heart failure: Secondary | ICD-10-CM

## 2014-02-01 DIAGNOSIS — I214 Non-ST elevation (NSTEMI) myocardial infarction: Secondary | ICD-10-CM

## 2014-02-01 LAB — HEPARIN LEVEL (UNFRACTIONATED)
HEPARIN UNFRACTIONATED: 0.43 [IU]/mL (ref 0.30–0.70)
HEPARIN UNFRACTIONATED: 0.46 [IU]/mL (ref 0.30–0.70)
HEPARIN UNFRACTIONATED: 0.18 [IU]/mL — AB (ref 0.30–0.70)

## 2014-02-01 LAB — CBC
HCT: 38.1 % — ABNORMAL LOW (ref 39.0–52.0)
HEMOGLOBIN: 13.1 g/dL (ref 13.0–17.0)
MCH: 30 pg (ref 26.0–34.0)
MCHC: 34.4 g/dL (ref 30.0–36.0)
MCV: 87.4 fL (ref 78.0–100.0)
PLATELETS: 262 10*3/uL (ref 150–400)
RBC: 4.36 MIL/uL (ref 4.22–5.81)
RDW: 13.6 % (ref 11.5–15.5)
WBC: 8.9 10*3/uL (ref 4.0–10.5)

## 2014-02-01 LAB — LIPID PANEL
CHOLESTEROL: 145 mg/dL (ref 0–200)
HDL: 38 mg/dL — AB (ref >39)
LDL Cholesterol: 73 mg/dL (ref 0–99)
Total CHOL/HDL Ratio: 3.8 RATIO
Triglycerides: 172 mg/dL — ABNORMAL HIGH (ref <150)
VLDL: 34 mg/dL (ref 0–40)

## 2014-02-01 LAB — BASIC METABOLIC PANEL
BUN: 16 mg/dL (ref 6–23)
CHLORIDE: 107 meq/L (ref 96–112)
CO2: 23 meq/L (ref 19–32)
Calcium: 8.6 mg/dL (ref 8.4–10.5)
Creatinine, Ser: 0.92 mg/dL (ref 0.50–1.35)
GFR calc Af Amer: >90 mL/min (ref >90)
GFR calc non Af Amer: 88 mL/min — ABNORMAL LOW (ref >90)
Glucose, Bld: 113 mg/dL — ABNORMAL HIGH (ref 70–99)
Potassium: 3.4 mEq/L — ABNORMAL LOW (ref 3.7–5.3)
SODIUM: 144 meq/L (ref 137–147)

## 2014-02-01 LAB — PROTIME-INR
INR: 0.99 (ref 0.00–1.49)
PROTHROMBIN TIME: 12.9 s (ref 11.6–15.2)

## 2014-02-01 LAB — COMPREHENSIVE METABOLIC PANEL
ALBUMIN: 3.3 g/dL — AB (ref 3.5–5.2)
ALT: 45 U/L (ref 0–53)
AST: 35 U/L (ref 0–37)
Alkaline Phosphatase: 72 U/L (ref 39–117)
BUN: 15 mg/dL (ref 6–23)
CO2: 24 mEq/L (ref 19–32)
CREATININE: 1 mg/dL (ref 0.50–1.35)
Calcium: 8.5 mg/dL (ref 8.4–10.5)
Chloride: 105 mEq/L (ref 96–112)
GFR calc Af Amer: >90 mL/min (ref >90)
GFR calc non Af Amer: 78 mL/min — ABNORMAL LOW (ref >90)
Glucose, Bld: 113 mg/dL — ABNORMAL HIGH (ref 70–99)
Potassium: 3.7 mEq/L (ref 3.7–5.3)
Sodium: 144 mEq/L (ref 137–147)
TOTAL PROTEIN: 6.3 g/dL (ref 6.0–8.3)
Total Bilirubin: 0.3 mg/dL (ref 0.3–1.2)

## 2014-02-01 LAB — TROPONIN I
TROPONIN I: 2.38 ng/mL — AB (ref <0.30)
Troponin I: 2.09 ng/mL (ref <0.30)

## 2014-02-01 LAB — TSH: TSH: 1.997 u[IU]/mL (ref 0.350–4.500)

## 2014-02-01 LAB — APTT: APTT: 52 s — AB (ref 24–37)

## 2014-02-01 LAB — HEMOGLOBIN A1C
Hgb A1c MFr Bld: 5.8 % — ABNORMAL HIGH (ref <5.7)
MEAN PLASMA GLUCOSE: 120 mg/dL — AB (ref <117)

## 2014-02-01 MED ORDER — HEPARIN BOLUS VIA INFUSION
3000.0000 [IU] | Freq: Once | INTRAVENOUS | Status: AC
  Administered 2014-02-01: 3000 [IU] via INTRAVENOUS
  Filled 2014-02-01: qty 3000

## 2014-02-01 MED ORDER — CARVEDILOL 6.25 MG PO TABS
6.2500 mg | ORAL_TABLET | Freq: Two times a day (BID) | ORAL | Status: DC
  Administered 2014-02-01 – 2014-02-04 (×6): 6.25 mg via ORAL
  Filled 2014-02-01 (×8): qty 1

## 2014-02-01 MED ORDER — AMIODARONE HCL IN DEXTROSE 360-4.14 MG/200ML-% IV SOLN
30.0000 mg/h | INTRAVENOUS | Status: DC
  Administered 2014-02-02: 30 mg/h via INTRAVENOUS
  Filled 2014-02-01 (×4): qty 200

## 2014-02-01 MED ORDER — FUROSEMIDE 10 MG/ML IJ SOLN
40.0000 mg | Freq: Two times a day (BID) | INTRAMUSCULAR | Status: DC
  Administered 2014-02-01 – 2014-02-02 (×3): 40 mg via INTRAVENOUS
  Filled 2014-02-01 (×4): qty 4

## 2014-02-01 MED ORDER — AMIODARONE HCL IN DEXTROSE 360-4.14 MG/200ML-% IV SOLN
60.0000 mg/h | INTRAVENOUS | Status: AC
  Administered 2014-02-01 (×4): 60 mg/h via INTRAVENOUS
  Filled 2014-02-01: qty 200

## 2014-02-01 MED ORDER — AMIODARONE LOAD VIA INFUSION
150.0000 mg | Freq: Once | INTRAVENOUS | Status: AC
  Administered 2014-02-01: 150 mg via INTRAVENOUS
  Filled 2014-02-01: qty 83.34

## 2014-02-01 MED ORDER — LISINOPRIL 10 MG PO TABS
10.0000 mg | ORAL_TABLET | Freq: Every day | ORAL | Status: DC
  Administered 2014-02-01 – 2014-02-04 (×4): 10 mg via ORAL
  Filled 2014-02-01 (×4): qty 1

## 2014-02-01 MED ORDER — POTASSIUM CHLORIDE CRYS ER 20 MEQ PO TBCR
40.0000 meq | EXTENDED_RELEASE_TABLET | Freq: Once | ORAL | Status: AC
  Administered 2014-02-01: 40 meq via ORAL
  Filled 2014-02-01: qty 2

## 2014-02-01 NOTE — Progress Notes (Signed)
ANTICOAGULATION CONSULT NOTE - Follow Up Consult  Pharmacy Consult for Heparin Indication: chest pain/ACS  Allergies  Allergen Reactions  . Penicillins Other (See Comments)    unknown    Patient Measurements: Height: 5\' 11"  (180.3 cm) Weight: 310 lb 6.5 oz (140.8 kg) IBW/kg (Calculated) : 75.3 Heparin Dosing Weight: 108 kg  Vital Signs: Temp: 98.4 F (36.9 C) (02/28 0800) Temp src: Oral (02/28 0800) BP: 150/95 mmHg (02/28 0800) Pulse Rate: 88 (02/28 0800)  Labs:  Recent Labs  01/31/14 1726 01/31/14 2233 02/01/14 0022 02/01/14 0320 02/01/14 0730 02/01/14 0913 02/01/14 0925  HGB 13.1  --   --   --   --  13.1  --   HCT 37.9*  --   --   --   --  38.1*  --   PLT 245  --   --   --   --  262  --   APTT  --   --  52*  --   --   --   --   LABPROT  --   --  12.9  --   --   --   --   INR  --   --  0.99  --   --   --   --   HEPARINUNFRC  --   --  0.18*  --   --  0.43  --   CREATININE 0.99  --  1.00  --  0.92  --   --   TROPONINI  --  2.11*  --  2.38*  --   --  2.09*    Estimated Creatinine Clearance: 118 ml/min (by C-G formula based on Cr of 0.92).   Medications:  Scheduled:  . aspirin EC  81 mg Oral Daily  . atorvastatin  80 mg Oral q1800  . metoprolol tartrate  50 mg Oral BID  . sodium chloride  3 mL Intravenous Q12H    Assessment: 63 yo M presents w/ SOB/CP upon wakening.. Patient had symptom of GI virus 5-6 days ago. Patient was transferred to [facility] ED from Urgent Care and found to have elevated troponin. Patient HL is therapeutic, CBC stable, and no signs of bleed noted.  Goal of Therapy:  Heparin level 0.3-0.7 units/ml Monitor platelets by anticoagulation protocol: Yes   Plan:  - Continue Heparin at 2100 units/hr and confirm therapeutic with 6hr HL - Check HL daily - Monitor signs of bleed  Forestine Na M 02/01/2014,10:31 AM

## 2014-02-01 NOTE — Progress Notes (Signed)
ANTICOAGULATION CONSULT NOTE - Follow Up Consult  Pharmacy Consult for heparin Indication: chest pain/ACS  Labs:  Recent Labs  01/31/14 1726 01/31/14 2233 02/01/14 0022  HGB 13.1  --   --   HCT 37.9*  --   --   PLT 245  --   --   APTT  --   --  52*  LABPROT  --   --  12.9  INR  --   --  0.99  HEPARINUNFRC  --   --  0.18*  CREATININE 0.99  --   --   TROPONINI  --  2.11*  --     Assessment: 62yo male subtherapeutic on heparin with initial dosing for CP.  Goal of Therapy:  Heparin level 0.3-0.7 units/ml   Plan:  Will rebolus with heparin 3000 units and increase gtt by 4 units/kg/hr to 2100 units/hr and check level in 6hr.  Vernard Gambles, PharmD, BCPS  02/01/2014,1:16 AM

## 2014-02-01 NOTE — Progress Notes (Signed)
Multiple attempts at education. Pt cuts conversation short and says that the "docs need to just give me some oxygen tanks, and I'll just go home." Will continue to try and educate pt.

## 2014-02-01 NOTE — Progress Notes (Signed)
  Echocardiogram 2D Echocardiogram has been performed.  Lorry Anastasi, [facility] 02/01/2014, 10:24 AM

## 2014-02-01 NOTE — Progress Notes (Signed)
Noticed pt heart rate went up to 152, went into room, pt c/o of sob, bp stable, ekg obtained, pt in afib, dr. Shirlee Latch on floor notified, orders received will continue to monitor.

## 2014-02-01 NOTE — Progress Notes (Signed)
ANTICOAGULATION CONSULT NOTE - Follow Up Consult  Pharmacy Consult for heparin Indication: chest pain/ACS  Allergies  Allergen Reactions  . Penicillins Other (See Comments)    unknown    Patient Measurements: Height: 5\' 11"  (180.3 cm) Weight: 310 lb 6.5 oz (140.8 kg) IBW/kg (Calculated) : 75.3 Heparin Dosing Weight: 108 kg  Vital Signs: Temp: 98.2 F (36.8 C) (02/28 1600) Temp src: Oral (02/28 1600) BP: 129/73 mmHg (02/28 1600) Pulse Rate: 80 (02/28 1600)  Labs:  Recent Labs  01/31/14 1726 01/31/14 2233 02/01/14 0022 02/01/14 0320 02/01/14 0730 02/01/14 0913 02/01/14 0925 02/01/14 1500  HGB 13.1  --   --   --   --  13.1  --   --   HCT 37.9*  --   --   --   --  38.1*  --   --   PLT 245  --   --   --   --  262  --   --   APTT  --   --  52*  --   --   --   --   --   LABPROT  --   --  12.9  --   --   --   --   --   INR  --   --  0.99  --   --   --   --   --   HEPARINUNFRC  --   --  0.18*  --   --  0.43  --  0.46  CREATININE 0.99  --  1.00  --  0.92  --   --   --   TROPONINI  --  2.11*  --  2.38*  --   --  2.09*  --     Estimated Creatinine Clearance: 118 ml/min (by C-G formula based on Cr of 0.92).   Medications:  Scheduled:  . aspirin EC  81 mg Oral Daily  . atorvastatin  80 mg Oral q1800  . carvedilol  6.25 mg Oral BID WC  . furosemide  40 mg Intravenous BID  . lisinopril  10 mg Oral Daily  . sodium chloride  3 mL Intravenous Q12H   Infusions:  . amiodarone (NEXTERONE PREMIX) 360 mg/200 mL dextrose 59.94 mg/hr (02/01/14 1600)   Followed by  . amiodarone (NEXTERONE PREMIX) 360 mg/200 mL dextrose    . heparin 2,100 Units/hr (02/01/14 1600)  . nitroGLYCERIN 20 mcg/min (02/01/14 1600)    Assessment: 63 yo male with chest pain is currently on therapeutic heparin.  Heparin level is 0.46. Goal of Therapy:  Heparin level 0.3-0.7 units/ml Monitor platelets by anticoagulation protocol: Yes   Plan:  1) Continue heparin at 2100 units/hr.  Harsha Yusko,  [facility] 02/01/2014,4:25 PM

## 2014-02-01 NOTE — Progress Notes (Addendum)
Patient ID: Charles Mckinney, male   DOB: [birth year], 63 y.o.   MRN: [id number]   SUBJECTIVE: Patient has no prior history of CAD.  He had a flu-like illness for most of the week, then yesterday he became severely dyspneic with exertion so went to the ER.  He was noted to have elevated troponin and volume overload/CHF.  Scheduled Meds: . aspirin EC  81 mg Oral Daily  . atorvastatin  80 mg Oral q1800  . carvedilol  6.25 mg Oral BID WC  . furosemide  40 mg Intravenous BID  . lisinopril  10 mg Oral Daily  . potassium chloride  40 mEq Oral Once  . sodium chloride  3 mL Intravenous Q12H   Continuous Infusions: . heparin 2,100 Units/hr (02/01/14 1000)  . nitroGLYCERIN 20 mcg/min (02/01/14 1000)   PRN Meds:.sodium chloride, nitroGLYCERIN, nitroGLYCERIN, sodium chloride    Filed Vitals:   02/01/14 0400 02/01/14 0500 02/01/14 0600 02/01/14 0800  BP: 134/92 128/87 135/89 150/95  Pulse: 81 80 79 88  Temp:    98.4 F (36.9 C)  TempSrc:    Oral  Resp: 22 25 26 19   Height:      Weight:      SpO2: 98% 97% 97% 96%    Intake/Output Summary (Last 24 hours) at 02/01/14 1147 Last data filed at 02/01/14 1000  Gross per 24 hour  Intake 873.95 ml  Output   1800 ml  Net -926.05 ml    LABS: Basic Metabolic Panel:  Recent Labs  80/88/11 0022 02/01/14 0730  NA 144 144  K 3.7 3.4*  CL 105 107  CO2 24 23  GLUCOSE 113* 113*  BUN 15 16  CREATININE 1.00 0.92  CALCIUM 8.5 8.6   Liver Function Tests:  Recent Labs  02/01/14 0022  AST 35  ALT 45  ALKPHOS 72  BILITOT 0.3  PROT 6.3  ALBUMIN 3.3*   No results found for this basename: LIPASE, AMYLASE,  in the last 72 hours CBC:  Recent Labs  01/31/14 1726 02/01/14 0913  WBC 10.3 8.9  NEUTROABS 8.2*  --   HGB 13.1 13.1  HCT 37.9* 38.1*  MCV 86.7 87.4  PLT 245 262   Cardiac Enzymes:  Recent Labs  01/31/14 2233 02/01/14 0320 02/01/14 0925  TROPONINI 2.11* 2.38* 2.09*   BNP: No components found with this basename:  POCBNP,  D-Dimer:  Recent Labs  01/31/14 2155  DDIMER 0.28   Hemoglobin A1C: No results found for this basename: HGBA1C,  in the last 72 hours Fasting Lipid Panel:  Recent Labs  02/01/14 0320  CHOL 145  HDL 38*  LDLCALC 73  TRIG 031*  CHOLHDL 3.8   Thyroid Function Tests: No results found for this basename: TSH, T4TOTAL, FREET3, T3FREE, THYROIDAB,  in the last 72 hours Anemia Panel: No results found for this basename: VITAMINB12, FOLATE, FERRITIN, TIBC, IRON, RETICCTPCT,  in the last 72 hours  RADIOLOGY: Dg Chest 2 View  01/31/2014   CLINICAL DATA:  Wheezing, difficulty breathing  EXAM: CHEST  2 VIEW  COMPARISON:  02/04/2006  FINDINGS: Borderline cardiomegaly. There is is perihilar increased bronchial and interstitial markings suspicious for edema, pneumonitis or reactive airway disease. Question small left pleural effusion.  IMPRESSION: Bilateral perihilar and lung bases increased bronchial interstitial markings suspicious for edema, pneumonitis or reactive airway disease. Question small left pleural effusion.   Electronically Signed   By: Natasha Mead M.D.   On: 01/31/2014 18:08    PHYSICAL EXAM General:  NAD Neck: JVP 10 cm, no thyromegaly or thyroid nodule.  Lungs: Crackles at bases bilaterally. CV: Nondisplaced PMI.  Heart regular S1/S2, no S3/S4, no murmur.  1+ ankle edema.   Abdomen: Soft, nontender, no hepatosplenomegaly, mild distention.  Neurologic: Alert and oriented x 3.  Psych: Normal affect. Extremities: No clubbing or cyanosis.   TELEMETRY: Reviewed telemetry pt in NSR  ASSESSMENT AND PLAN: 63 yo with history of HTN presented to ER with severe exertional dyspnea x 1 day after flu-like illness x 1 week.  He had mildly elevated troponin and is volume overloaded on exam.  1. Acute CHF: Patient is volume overloaded with wet CXR and elevated BNP.  ? Etiology.  Given elevated troponin, could be in the setting of ACS.  However, cannot rule out demand ischemia with  elevated troponin due to volume overload.  - Echocardiogram to be done today.  - Lasix 40 mg IV bid for now, follow response (not on Lasix at home).  - Will use Coreg 6.25 mg bid rather than metoprolol (low dose because of acute CHF).  - Add lisinopril 10 mg daily and can continue NTG gtt for now.  2. NSTEMI: Relatively flat enzyme trend.  ? Demand ischemia in setting of CHF/volume overload versus true ACS with plaque rupture.  No chest pain, has been short of breath. D dimer negative.  - Continue ASA 81, heparin gtt, statin.  - I think that he should have LHC/RHC, likely on Monday, to assess for coronary disease and define filling pressures. 3. HTN: Continue NTG gtt, adding lisinopril.   Marca AnconaDalton Marilyn Nihiser 02/01/2014 11:53 AM  Patient went into atrial fibrillation with RVR this afternoon, HR 140s.  Prior, he has been in NSR.  I reviewed echo, EF 30-35% with WMAs.  He is already on heparin gtt.  Will start amiodarone gtt to try to rate control and potentially get him back into NSR.    Marca AnconaDalton Walter Grima 02/01/2014 1:41 PM

## 2014-02-02 DIAGNOSIS — I4891 Unspecified atrial fibrillation: Secondary | ICD-10-CM

## 2014-02-02 DIAGNOSIS — I5021 Acute systolic (congestive) heart failure: Secondary | ICD-10-CM

## 2014-02-02 LAB — CBC
HCT: 37.9 % — ABNORMAL LOW (ref 39.0–52.0)
Hemoglobin: 12.9 g/dL — ABNORMAL LOW (ref 13.0–17.0)
MCH: 30 pg (ref 26.0–34.0)
MCHC: 34 g/dL (ref 30.0–36.0)
MCV: 88.1 fL (ref 78.0–100.0)
Platelets: 258 10*3/uL (ref 150–400)
RBC: 4.3 MIL/uL (ref 4.22–5.81)
RDW: 13.9 % (ref 11.5–15.5)
WBC: 10 10*3/uL (ref 4.0–10.5)

## 2014-02-02 LAB — BASIC METABOLIC PANEL
BUN: 18 mg/dL (ref 6–23)
CO2: 25 meq/L (ref 19–32)
Calcium: 8.6 mg/dL (ref 8.4–10.5)
Chloride: 102 mEq/L (ref 96–112)
Creatinine, Ser: 1.06 mg/dL (ref 0.50–1.35)
GFR calc non Af Amer: 73 mL/min — ABNORMAL LOW (ref >90)
GFR, EST AFRICAN AMERICAN: 84 mL/min — AB (ref >90)
Glucose, Bld: 108 mg/dL — ABNORMAL HIGH (ref 70–99)
POTASSIUM: 3.3 meq/L — AB (ref 3.7–5.3)
SODIUM: 144 meq/L (ref 137–147)

## 2014-02-02 LAB — HEPARIN LEVEL (UNFRACTIONATED): HEPARIN UNFRACTIONATED: 0.34 [IU]/mL (ref 0.30–0.70)

## 2014-02-02 MED ORDER — POTASSIUM CHLORIDE CRYS ER 20 MEQ PO TBCR
40.0000 meq | EXTENDED_RELEASE_TABLET | Freq: Two times a day (BID) | ORAL | Status: DC
  Administered 2014-02-02 – 2014-02-04 (×5): 40 meq via ORAL
  Filled 2014-02-02 (×6): qty 2

## 2014-02-02 MED ORDER — ALPRAZOLAM 0.25 MG PO TABS
0.2500 mg | ORAL_TABLET | Freq: Once | ORAL | Status: AC
  Administered 2014-02-02: 0.25 mg via ORAL
  Filled 2014-02-02: qty 1

## 2014-02-02 MED ORDER — SODIUM CHLORIDE 0.9 % IV SOLN
INTRAVENOUS | Status: DC
  Administered 2014-02-02: 5 mL/h via INTRAVENOUS

## 2014-02-02 MED ORDER — INFLUENZA VAC SPLIT QUAD 0.5 ML IM SUSP
0.5000 mL | INTRAMUSCULAR | Status: AC
  Administered 2014-02-04: 0.5 mL via INTRAMUSCULAR
  Filled 2014-02-02 (×2): qty 0.5

## 2014-02-02 MED ORDER — FUROSEMIDE 10 MG/ML IJ SOLN
60.0000 mg | Freq: Three times a day (TID) | INTRAMUSCULAR | Status: DC
  Administered 2014-02-02 – 2014-02-03 (×5): 60 mg via INTRAVENOUS
  Filled 2014-02-02 (×6): qty 6

## 2014-02-02 MED ORDER — POTASSIUM CHLORIDE CRYS ER 20 MEQ PO TBCR
40.0000 meq | EXTENDED_RELEASE_TABLET | Freq: Once | ORAL | Status: AC
  Administered 2014-02-02: 40 meq via ORAL
  Filled 2014-02-02: qty 2

## 2014-02-02 MED ORDER — AMIODARONE HCL 200 MG PO TABS
400.0000 mg | ORAL_TABLET | Freq: Two times a day (BID) | ORAL | Status: DC
  Administered 2014-02-02 – 2014-02-03 (×4): 400 mg via ORAL
  Filled 2014-02-02 (×6): qty 2

## 2014-02-02 MED ORDER — FUROSEMIDE 10 MG/ML IJ SOLN
INTRAMUSCULAR | Status: AC
  Filled 2014-02-02: qty 8

## 2014-02-02 NOTE — Progress Notes (Signed)
ANTICOAGULATION CONSULT NOTE - Follow Up Consult  Pharmacy Consult for heparin Indication: chest pain/ACS  Allergies  Allergen Reactions  . Penicillins Other (See Comments)    unknown    Patient Measurements: Height: 5\' 11"  (180.3 cm) Weight: 309 lb 1.4 oz (140.2 kg) IBW/kg (Calculated) : 75.3 Heparin Dosing Weight: 108 kg  Vital Signs: Temp: 98.3 F (36.8 C) (03/01 0747) Temp src: Oral (03/01 0747) BP: 144/109 mmHg (03/01 0747) Pulse Rate: 78 (03/01 0800)  Labs:  Recent Labs  01/31/14 1726 01/31/14 2233  02/01/14 0022 02/01/14 0320 02/01/14 0730 02/01/14 0913 02/01/14 0925 02/01/14 1500 02/02/14 0325  HGB 13.1  --   --   --   --   --  13.1  --   --  12.9*  HCT 37.9*  --   --   --   --   --  38.1*  --   --  37.9*  PLT 245  --   --   --   --   --  262  --   --  258  APTT  --   --   --  52*  --   --   --   --   --   --   LABPROT  --   --   --  12.9  --   --   --   --   --   --   INR  --   --   --  0.99  --   --   --   --   --   --   HEPARINUNFRC  --   --   < > 0.18*  --   --  0.43  --  0.46 0.34  CREATININE 0.99  --   --  1.00  --  0.92  --   --   --  1.06  TROPONINI  --  2.11*  --   --  2.38*  --   --  2.09*  --   --   < > = values in this interval not displayed.  Estimated Creatinine Clearance: 102.2 ml/min (by C-G formula based on Cr of 1.06).   Medications:  Scheduled:  . aspirin EC  Charles mg Oral Daily  . atorvastatin  80 mg Oral q1800  . carvedilol  6.25 mg Oral BID WC  . furosemide  40 mg Intravenous BID  . lisinopril  10 mg Oral Daily  . sodium chloride  3 mL Intravenous Q12H   Infusions:  . sodium chloride Stopped (02/02/14 1000)  . amiodarone (NEXTERONE PREMIX) 360 mg/200 mL dextrose Stopped (02/02/14 1000)  . heparin 2,100 Units (02/02/14 0800)  . nitroGLYCERIN Stopped (02/01/14 1814)    Assessment: 63 yo Mckinney with chest pain and elevated trop started on heparin gtt. Heparin is now therapeutic, CBC stable, and no signs of bleed noted.   Goal  of Therapy:  Heparin level 0.3-0.7 units/ml Monitor platelets by anticoagulation protocol: Yes   Plan:  1) Continue heparin at 2100 units/hr. 2) Check HL & CBC daily 3) Monitor signs of bleed  Forestine Na M 02/02/2014,10:15 AM

## 2014-02-02 NOTE — Progress Notes (Signed)
Patient ID: Charles Mckinney, male   DOB: Apr 11, 1951, 63 y.o.   MRN: [id number]   SUBJECTIVE: Patient has no prior history of CAD.  He had a flu-like illness for most of the week, then Friday he became severely dyspneic with exertion so went to the ER.  He was noted to have elevated troponin and volume overload/CHF.  Yesterday, he went into atrial fibrillation with RVR that was terminated on amiodarone gtt.  He remains in NSR today.  He says his breathing is a lot better. He did not diurese particularly well.   Scheduled Meds: . aspirin EC  81 mg Oral Daily  . atorvastatin  80 mg Oral q1800  . carvedilol  6.25 mg Oral BID WC  . furosemide  60 mg Intravenous 3 times per day  . lisinopril  10 mg Oral Daily  . potassium chloride  40 mEq Oral Once  . potassium chloride  40 mEq Oral BID  . sodium chloride  3 mL Intravenous Q12H   Continuous Infusions: . sodium chloride 5 mL/hr at 02/02/14 1052  . amiodarone (NEXTERONE PREMIX) 360 mg/200 mL dextrose 30 mg/hr (02/02/14 1052)  . heparin 2,100 Units (02/02/14 0800)  . nitroGLYCERIN Stopped (02/01/14 1814)   PRN Meds:.sodium chloride, nitroGLYCERIN, nitroGLYCERIN, sodium chloride    Filed Vitals:   02/02/14 0600 02/02/14 0700 02/02/14 0747 02/02/14 0800  BP: 126/67 135/78 144/109   Pulse: 66 69  78  Temp:   98.3 F (36.8 C)   TempSrc:   Oral   Resp: 21 20 20 17   Height:      Weight:      SpO2: 97% 97% 98% 93%    Intake/Output Summary (Last 24 hours) at 02/02/14 1132 Last data filed at 02/02/14 1052  Gross per 24 hour  Intake 2248.05 ml  Output   3000 ml  Net -751.95 ml    LABS: Basic Metabolic Panel:  Recent Labs  97/94/80 0730 02/02/14 0325  NA 144 144  K 3.4* 3.3*  CL 107 102  CO2 23 25  GLUCOSE 113* 108*  BUN 16 18  CREATININE 0.92 1.06  CALCIUM 8.6 8.6   Liver Function Tests:  Recent Labs  02/01/14 0022  AST 35  ALT 45  ALKPHOS 72  BILITOT 0.3  PROT 6.3  ALBUMIN 3.3*   No results found for this  basename: LIPASE, AMYLASE,  in the last 72 hours CBC:  Recent Labs  01/31/14 1726 02/01/14 0913 02/02/14 0325  WBC 10.3 8.9 10.0  NEUTROABS 8.2*  --   --   HGB 13.1 13.1 12.9*  HCT 37.9* 38.1* 37.9*  MCV 86.7 87.4 88.1  PLT 245 262 258   Cardiac Enzymes:  Recent Labs  01/31/14 2233 02/01/14 0320 02/01/14 0925  TROPONINI 2.11* 2.38* 2.09*   BNP: No components found with this basename: POCBNP,  D-Dimer:  Recent Labs  01/31/14 2155  DDIMER 0.28   Hemoglobin A1C:  Recent Labs  02/01/14 0022  HGBA1C 5.8*   Fasting Lipid Panel:  Recent Labs  02/01/14 0320  CHOL 145  HDL 38*  LDLCALC 73  TRIG 165*  CHOLHDL 3.8   Thyroid Function Tests:  Recent Labs  02/01/14 0022  TSH 1.997   Anemia Panel: No results found for this basename: VITAMINB12, FOLATE, FERRITIN, TIBC, IRON, RETICCTPCT,  in the last 72 hours  RADIOLOGY: Dg Chest 2 View  01/31/2014   CLINICAL DATA:  Wheezing, difficulty breathing  EXAM: CHEST  2 VIEW  COMPARISON:  02/04/2006  FINDINGS: Borderline cardiomegaly. There is is perihilar increased bronchial and interstitial markings suspicious for edema, pneumonitis or reactive airway disease. Question small left pleural effusion.  IMPRESSION: Bilateral perihilar and lung bases increased bronchial interstitial markings suspicious for edema, pneumonitis or reactive airway disease. Question small left pleural effusion.   Electronically Signed   By: Natasha MeadLiviu  Pop M.D.   On: 01/31/2014 18:08    PHYSICAL EXAM General: NAD Neck: Thick, JVP 10 cm, no thyromegaly or thyroid nodule.  Lungs: Crackles at bases bilaterally. CV: Nondisplaced PMI.  Heart regular S1/S2, no S3/S4, no murmur.  1+ ankle edema.   Abdomen: Soft, nontender, no hepatosplenomegaly, mild distention.  Neurologic: Alert and oriented x 3.  Psych: Normal affect. Extremities: No clubbing or cyanosis.   TELEMETRY: Reviewed telemetry pt in NSR  ASSESSMENT AND PLAN: 63 yo with history of HTN  presented to ER with severe exertional dyspnea x 1 day after flu-like illness x 1 week.  He had mildly elevated troponin and is volume overloaded on exam.  On Saturday, he went into atrial fibrillation with RVR that was terminated by amiodarone. 1. Acute systolic CHF: Patient is volume overloaded with wet CXR and elevated BNP.  Echo showed EF 30-35% with wall motion abnormalities, so concerned for ischemic cardiomyopathy.    - Increase Lasix to 60 mg IV every 8 hours, replete K.  - Continue Coreg and lisinopril. 2. NSTEMI: Relatively flat enzyme trend.  ? Demand ischemia in setting of CHF/volume overload versus true ACS with plaque rupture.  No chest pain, has been short of breath. D dimer negative.  - Continue ASA 81, heparin gtt, statin.  - I recommended LHC/RHC to assess for coronary disease and define filling pressures given elevated troponin and wall motion abnormalities on echo.  He is very reticent to undergo procedure, does not want "surgery."  Concerned he will have to stay in hospital a long time and lose his house.  Wants to be treated medically [facility].  3. Atrial fibrillation: Atrial fibrillation with RVR yesterday, made patient feel worse.  This resolved with amiodarone gtt.  He is currently on amiodarone gtt + heparin gtt.   - Change to po amiodarone.  Given how symptomatic he was, may want to continue this for now.  - On heparin gtt in case he gets cardiac cath.  However, I expect he is not going to agree to it.  In that case, would probably be best to put him on Pradaxa as this is the preferred [facility] and [facility].   Marca AnconaDalton McLean 02/02/2014 11:32 AM

## 2014-02-02 NOTE — Progress Notes (Signed)
Upon morning assessment, pt's IV site were WNL; at 10:00am Pt's PIV at R AC infiltrated; Amiodarone & NS were infusing through it; site pink and swollen; area marked; will continue to monitor site closely and update as needed; IV team paged & notified of infiltration and need for new PIV

## 2014-02-03 LAB — CBC
HCT: 37.8 % — ABNORMAL LOW (ref 39.0–52.0)
Hemoglobin: 13 g/dL (ref 13.0–17.0)
MCH: 30.3 pg (ref 26.0–34.0)
MCHC: 34.4 g/dL (ref 30.0–36.0)
MCV: 88.1 fL (ref 78.0–100.0)
PLATELETS: 239 10*3/uL (ref 150–400)
RBC: 4.29 MIL/uL (ref 4.22–5.81)
RDW: 13.8 % (ref 11.5–15.5)
WBC: 9.9 10*3/uL (ref 4.0–10.5)

## 2014-02-03 LAB — BASIC METABOLIC PANEL
BUN: 17 mg/dL (ref 6–23)
CHLORIDE: 99 meq/L (ref 96–112)
CO2: 26 mEq/L (ref 19–32)
CREATININE: 1.1 mg/dL (ref 0.50–1.35)
Calcium: 8.6 mg/dL (ref 8.4–10.5)
GFR calc non Af Amer: 70 mL/min — ABNORMAL LOW (ref >90)
GFR, EST AFRICAN AMERICAN: 81 mL/min — AB (ref >90)
Glucose, Bld: 108 mg/dL — ABNORMAL HIGH (ref 70–99)
Potassium: 3.2 mEq/L — ABNORMAL LOW (ref 3.7–5.3)
Sodium: 139 mEq/L (ref 137–147)

## 2014-02-03 LAB — HEPARIN LEVEL (UNFRACTIONATED): HEPARIN UNFRACTIONATED: 0.35 [IU]/mL (ref 0.30–0.70)

## 2014-02-03 MED ORDER — RIVAROXABAN 20 MG PO TABS
20.0000 mg | ORAL_TABLET | Freq: Every day | ORAL | Status: DC
  Administered 2014-02-03: 20 mg via ORAL
  Filled 2014-02-03 (×2): qty 1

## 2014-02-03 NOTE — Progress Notes (Signed)
ANTICOAGULATION CONSULT NOTE - Follow Up Consult  Pharmacy Consult for heparin Indication: chest pain/ACS  Allergies  Allergen Reactions  . Penicillins Other (See Comments)    unknown    Patient Measurements: Height: 5\' 11"  (180.3 cm) Weight: 304 lb 14.3 oz (138.3 kg) IBW/kg (Calculated) : 75.3 Heparin Dosing Weight: 108 kg  Vital Signs: Temp: 98.2 F (36.8 C) (03/02 0755) Temp src: Oral (03/02 0755) BP: 132/68 mmHg (03/02 0755) Pulse Rate: 70 (03/02 0500)  Labs:  Recent Labs  01/31/14 1726 01/31/14 2233  02/01/14 0022 02/01/14 0320 02/01/14 0730 02/01/14 0913 02/01/14 0925 02/01/14 1500 02/02/14 0325 02/03/14 0307  HGB 13.1  --   --   --   --   --  13.1  --   --  12.9* 13.0  HCT 37.9*  --   --   --   --   --  38.1*  --   --  37.9* 37.8*  PLT 245  --   --   --   --   --  262  --   --  258 239  APTT  --   --   --  52*  --   --   --   --   --   --   --   LABPROT  --   --   --  12.9  --   --   --   --   --   --   --   INR  --   --   --  0.99  --   --   --   --   --   --   --   HEPARINUNFRC  --   --   < > 0.18*  --   --  0.43  --  0.46 0.34 0.35  CREATININE 0.99  --   --  1.00  --  0.92  --   --   --  1.06 1.10  TROPONINI  --  2.11*  --   --  2.38*  --   --  2.09*  --   --   --   < > = values in this interval not displayed.  Estimated Creatinine Clearance: 97.7 ml/min (by C-G formula based on Cr of 1.1).   Medications:  Scheduled:  . amiodarone  400 mg Oral BID  . aspirin EC  81 mg Oral Daily  . atorvastatin  80 mg Oral q1800  . carvedilol  6.25 mg Oral BID WC  . furosemide  60 mg Intravenous 3 times per day  . influenza vac split quadrivalent PF  0.5 mL Intramuscular Tomorrow-1000  . lisinopril  10 mg Oral Daily  . potassium chloride  40 mEq Oral BID  . sodium chloride  3 mL Intravenous Q12H   Infusions:  . sodium chloride 5 mL/hr at 02/02/14 1052  . heparin 2,100 Units/hr (02/02/14 2114)  . nitroGLYCERIN Stopped (02/01/14 1814)    Assessment: 63 yo  male with chest pain and elevated trop started on heparin gtt. Heparin is at goal (HL= 0.35). Patient noted for cath today.  Goal of Therapy:  Heparin level 0.3-0.7 units/ml Monitor platelets by anticoagulation protocol: Yes   Plan:  1) Continue heparin at 2100 units/hr. 2) Check HL & CBC daily 3) Monitor signs of bleed  Charles Mckinney 02/03/2014,8:32 AM

## 2014-02-03 NOTE — Progress Notes (Signed)
Subjective:  Very difficult to assess. He is talking that he will lose his house if he does not get back to work and does not wish to have catheterization done here. Wants something to be done to get him into the [place]. Concerned about being back on oxygen. Denies recurrent atrial fibrillation. Has no PND, orthopnea or recurrent chest pain.  Objective:  Vital Signs in the last 24 hours: BP 132/68  Pulse 70  Temp(Src) 98.2 F (36.8 C) (Oral)  Resp 20  Ht 5\' 11"  (1.803 m)  Wt 138.3 kg (304 lb 14.3 oz)  BMI 42.54 kg/m2  SpO2 96%  Physical Exam: Severely obese white male in no acute distress  Lungs:  Clear Cardiac:  Regular rhythm, normal S1 and S2, no S3 Abdomen:  Soft, nontender, no masses Extremities:  No edema present  Intake/Output from previous day: 03/01 0701 - 03/02 0700 In: 1559.8 [P.O.:880; I.V.:679.8] Out: 2830 [Urine:2830]  Weight Filed Weights   02/01/14 0316 02/02/14 0400 02/03/14 0500  Weight: 140.8 kg (310 lb 6.5 oz) 140.2 kg (309 lb 1.4 oz) 138.3 kg (304 lb 14.3 oz)    Lab Results: Basic Metabolic Panel:  Recent Labs  28/31/51 0325 02/03/14 0307  NA 144 139  K 3.3* 3.2*  CL 102 99  CO2 25 26  GLUCOSE 108* 108*  BUN 18 17  CREATININE 1.06 1.10   CBC:  Recent Labs  01/31/14 1726  02/02/14 0325 02/03/14 0307  WBC 10.3  < > 10.0 9.9  NEUTROABS 8.2*  --   --   --   HGB 13.1  < > 12.9* 13.0  HCT 37.9*  < > 37.9* 37.8*  MCV 86.7  < > 88.1 88.1  PLT 245  < > 258 239  < > = values in this interval not displayed. Cardiac Enzymes:  Recent Labs  01/31/14 2233 02/01/14 0320 02/01/14 0925  TROPONINI 2.11* 2.38* 2.09*    Telemetry: Currently normal sinus rhythm  Assessment/Plan:  1. Acute systolic congestive heart failure on top of chronic-the etiology of the heart failure is unknown at this time. He may have an ischemic cardiomyopathy or he may have a recent viral cardiomyopathy 2. Non-ST elevation myocardial infarction-unclear whether due to  demand ischemia or to CAD 3. Morbid obesity 4. Paroxysmal atrial fibrillation  Recommendations:  He is adamant about trying to get out of the hospital and getting back to work. Social situation financial situation very challenging. Difficulty getting to understand the severity of the situation. We'll ask about case management and  social work evaluation. I will initiate XARELTO 20 mg daily and stop heparin since he is not planning on catheterization at this time. Ambulate in the hall. He has diuresed significantly. After case management see [place], see if they can help at all with referral to the [facility] and  followup at the [place].     Darden Palmer  MD [facility]  02/03/2014, 9:29 AM

## 2014-02-03 NOTE — Care Management Note (Addendum)
    Page 1 of 1   02/04/2014     9:11:57 AM   CARE MANAGEMENT NOTE 02/04/2014  Patient:  Charles Mckinney, Charles Mckinney   Account Number:  [id number]  Date Initiated:  02/03/2014  Documentation initiated by:  Junius Creamer  Subjective/Objective Assessment:   adm w mi     Action/Plan:   lives alone   Anticipated DC Date:  02/04/2014   Anticipated DC Plan:  HOME/SELF CARE  In-house referral  Clinical Social Worker      DC Planning Services  CM consult  Follow-up appt scheduled      Choice offered to / List presented to:             Status of service:   Medicare Important Message given?   (If response is "NO", the following Medicare IM given date fields will be blank) Date Medicare IM given:   Date Additional Medicare IM given:    Discharge Disposition:  HOME/SELF CARE  Per UR Regulation:  Reviewed for med. necessity/level of care/duration of stay  If discussed at Long Length of Stay Meetings, dates discussed:    Comments:  3/3  0910 debbie Hue Steveson rn,bsn appt sched w vs in [place] at [facility] office w dr Erskine Squibb on 02-13-14 at 1001am.pt has xarelto 30day free card to use til gets to [facility].  3/2 1019 debbie Mohab Ashby rn,bsn spoke w pt. 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 episode of inactivity w va and has been to [place] but trying to get pcp at [employer] office. have left vm for april w [place] [facility] of pt's adm, that pt states may need home o2 and wants pcp in [place]. spoke w [facility] and they also will let va know pt in hosp. april w [place] called back and pt has been seen by va. ext [phone] given to leave message that pt wants pcp in [place]. if needs home eq call [phone] ext [phone]. will cont to follow.

## 2014-02-03 NOTE — Progress Notes (Signed)
CARDIAC REHAB PHASE I   PRE:  Rate/Rhythm: 78 SR with PAC's  BP:  Supine:   Sitting: 107/55  Standing:    SaO2: 92 RA  MODE:  Ambulation: 350 ft   POST:  Rate/Rhythm: 92 SR with PAC's  BP:  Supine:   Sitting: 108/63  Standing:    SaO2: 95 RA 1350-1425 Assisted X 1 to ambulate. Gait steady. Pt able to walk 350 feet, denies any cp or SOB. VS stable. Pt back to recliner after walk with call light in reach. Started MI and CHF education with pt. I gave pt MI booklet and CHF packet. I was unable to get pt to understand his situation. All pt kept saying is that he has to get back to work and he has things he has to take care of at home. "I will not do anything strenuous at home or at work.". We discussed MI restrictions and signs and symptoms of CHF. We will continue to follow pt tomorrow. He clearly does not grasp the seriousness of his situation. He think following up at the [place], checking in on a Friday and getting out on Monday will handle his situation.  Melina Copa RN 02/03/2014 2:23 PM

## 2014-02-03 NOTE — Progress Notes (Signed)
CSW consult: "Pt concerned about financial help; wants to talk to SW first thing before committing to having cath done." CSW called financial counseling and asked for insight about helping pt, as CSW does not help with billing/financial assistance. CSW will provide pt with phone number to call for financial assistance: [phone] and CSW asked financial counselor to come speak with pt. CSW signing off after providing this information to pt.    Maryclare Labrador, MSW, [facility] Clinical Social Worker [phone]

## 2014-02-03 NOTE — Progress Notes (Addendum)
Clinical Social Work Department BRIEF PSYCHOSOCIAL ASSESSMENT 02/03/2014  Patient:  Charles Mckinney, Charles Mckinney     Account Number:  [id number]     Admit date:  01/31/2014  Clinical Social Worker:  Freeman Caldron  Date/Time:  02/03/2014 10:13 AM  Referred by:  Physician  Date Referred:  02/03/2014 Referred for  Other - See comment   Other Referral:   Pt concerned about finances, paying for cath procedure, losing house, arranging VA benefits.   Interview type:  Patient Other interview type:    PSYCHOSOCIAL DATA Living Status:  ALONE Admitted from facility:   Level of care:   Primary support name:  Charles Mckinney. [phone]) Primary support relationship to patient:  CHILD, ADULT Degree of support available:   Good--pt works for [employer] and receives support from son.    CURRENT CONCERNS Current Concerns  Other - See comment   Other Concerns:   Pt wants to be transferred to [place] assist with paying hospital procedure. Doesn't want cath procedure, wants to go home and continue working to maintain housing and job.    SOCIAL WORK ASSESSMENT / PLAN CSW and RNCM met jointly with pt in his room. Pt explained he was active with the [facility] but that he stopped going and he needs a new PCP to receive medical assistance; pt does not want cath procedure done at this hospital, but cannot be transferred to [facility] until has PCP established. Pt states his hot water is off and he is worried about keeping housing and paying bills while in hospital. Pt states he needs to leave the hospital to get back to work, and that he wants to go today and is feeling better. CSW asked pt what resources he believes would be helpful, and pt states he wants to get in touch with the [facility] to explain his situation. Pt was going to [place] in [place] is going to call to inform them pt is in the hospital and needs to establish PCP. Pt calling VA office in [place] to explain his  situation and inquire about establishing PCP. CSW wrote letter for pt to provide to his boss excusing him from missed work. CSW provided phone number for financial assistance program here in the hospital, and called financial counselor and left voicemail explaining pt's situation and requesting she come speak to pt about payment options, as he is self-pay.   Assessment/plan status:  Psychosocial Support/Ongoing Assessment of Needs Other assessment/ plan:   Information/referral to community resources:   Provided phone number for fiancial assistance program in our hospital, [place] to inform them pt is in the hospital and see what assistance they can provde, RNCM informed of phone number to provide pt to arrange a PCP in [place]--CSW provided this to pt, CSW provided phone number and address for [place] office in [place], [place] wrote letter on hospital letter-head for pt to bring to work to excuse him from missed days due to hospitalization.    [name]/FAMILY'S RESPONSE TO PLAN OF CARE: Good--pt thanked RNCM and CSW for assistance and was happy to hear there is a VA office in [place]. Pt engaged in conversation with CSW and RNCM and expressed frustration with the VA and the process involved in establishing a PCP, and being frustrated that he could not leave the hospital today. Pt states he is feeling better and wants to go back to work and to go home. CSW provided support and talked extensively with pt about his  right to leave the hospital AMA but the benefits of staying. Pt concluded that because his shift is covered at work today, he will stay. Pt is hopeful RNCM will hear back from [place] to arrange a PCP in [facility], and CSW provided the phone number for him to arrange this if he leaves the hospital before they call RNCM back. Pt picking up the phone to call [facility] when [facility] and CSW left the room.       Ky Barban, MSW, [facility] Clinical Social Worker [phone]

## 2014-02-04 MED ORDER — FUROSEMIDE 40 MG PO TABS
40.0000 mg | ORAL_TABLET | Freq: Every day | ORAL | Status: DC
  Administered 2014-02-04: 40 mg via ORAL

## 2014-02-04 MED ORDER — POTASSIUM CHLORIDE CRYS ER 20 MEQ PO TBCR: 40.0000 meq | EXTENDED_RELEASE_TABLET | Freq: Every day | ORAL | Status: DC

## 2014-02-04 MED ORDER — CARVEDILOL 6.25 MG PO TABS: 6.2500 mg | ORAL_TABLET | Freq: Two times a day (BID) | ORAL | Status: DC

## 2014-02-04 MED ORDER — LISINOPRIL 10 MG PO TABS: 10.0000 mg | ORAL_TABLET | Freq: Every day | ORAL | Status: DC

## 2014-02-04 MED ORDER — FUROSEMIDE 40 MG PO TABS: 40.0000 mg | ORAL_TABLET | Freq: Every day | ORAL | Status: DC

## 2014-02-04 MED ORDER — NITROGLYCERIN 0.4 MG SL SUBL: 0.4000 mg | SUBLINGUAL_TABLET | SUBLINGUAL | Status: DC | PRN

## 2014-02-04 MED ORDER — RIVAROXABAN 20 MG PO TABS: 20.0000 mg | ORAL_TABLET | Freq: Every day | ORAL | Status: DC

## 2014-02-04 NOTE — Progress Notes (Signed)
[id number] Cardiac Rehab Completed MI and CHF education with pt and step son. Pt voices understanding. He continues to voice concern that he can not be out of work, that he will lose his home and he has no one to pay the bills. I strongly encouraged him to keep his appointment with VA on March 12th. He states that he will. Pt admits that he has ran out of his medications for about a month before he had went back to [place]. He also admits that he had been under a lot of stress with things breaking around the house and having a virus. I attempted to explain how important it was to notify MD if he has chest pain, not to just use sL NTG and not let MD know. We discussed low sodium heart healthy diet, ways to cut sugars out of his diet, weight loss and getting started doing some light walking. He is not interested in Outpt. CRP due to the cost. Pt does admit that this scared him and that he was going to follow up with VA. Beatrix Fetters, RN 02/04/2014 10:19 AM

## 2014-02-04 NOTE — Discharge Summary (Signed)
Physician Discharge Summary  Patient ID: Charles Mckinney MRN: [id number] DOB/AGE: 64/05/1951 64 y.o.  Admit date: 01/31/2014 Discharge date: 02/04/2014  Primary Physician:  None  Primary Discharge Diagnosis:  1. Acute on chronic systolic congestive heart failure  Secondary Discharge Diagnosis: 2. Cardiomyopathy undetermined type-possible ischemic versus idiopathic 3. Hyperlipidemia 4. Non-ST elevation myocardial infarction-unclear whether due to primary coronary artery disease or demand ischemia 5. Morbid obesity 6. Hypertensive heart disease 7. Paroxysmal atrial fibrillation resolved  Procedures:  Echocardiogram  Hospital Course: This 63 year old male has a history of morbid obesity, hypertension and hyperlipidemia. He had admission to the hospital number of years ago and had a stress Cardiolite during that admission showing an ejection fraction 57% with no ischemia. He has been seen sporadically here in town but gets care only episodically [facility]. He was last seen there about a year ago and had his medicines filled and then recently he twisted his right ankle and has been somewhat sedentary. 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 Earlier this week he developed nausea vomiting and significant diarrhea and had this through Wednesday. He continued to work at night and worked last night. He came home and went to bed and woke up around 1:30 and was mildly short of breath. He then went out to the mailbox and became severely short of breath and developed severe dyspnea with both the level of activity. Because of the severe dyspnea with any activity he went to urgent care where he was found to have a right bundle branch block and lateral T-wave changes and was sent to the emergency room. Troponin was mildly elevated. The patient denied having any chest pain or tightness and stated earlier this week he was able to play with his dog and  walk around without symptoms. He does not have PND orthopnea. He may have had an episode of some chest discomfort a while back when he breathes in cold air.  The patient was felt to have congestive heart failure and also had elevation of troponin on admission. He was given intravenous furosemide in place initially and in the anus nitroglycerin. He had moderate elevation of his troponins. His primary concern was getting followup at the [facility] and he adamantly refused to have a lot of additional diagnostic testing done at the hospital despite multiple conversations with support staff, and physicians. An echocardiogram was performed with findings of an ejection fraction of 30-35%, anterior, anteroseptal and apical hypokinesis.  He was diuresed from 317-304 pounds. He developed atrial fibrillation with rapid response and was treated with amiodarone and converted to sinus rhythm. He was changed to carvedilol and had lisinopril added. He was placed on therapy with XARELTO 20 mg daily but it became apparent that he was not willing to have cardiac catheterization here. He was seen by the Child psychotherapistsocial worker as well as the case Production designer, theatre/television/filmmanager and was adamant that he wanted to follow the [facility] for further testing and was adamant that he wanted to go back to work soon. Importance of followup and additional testing was discussed with the patient as well as the fact that he could have significant underlying coronary artery disease or other issues. At this time I was not willing to discharge him on amiodarone because I was concerned about whether he would followup adequately or not.  He was given the resources to followup with the VA but he was also given a followup appointment  to see me in one week and is to call and make this appointment. He was instructed to weigh daily, to avoid excess sodium, and to follow a calorie restricted weight reduction diet.   Discharge Exam: Blood pressure 137/63, pulse 68, temperature 98.1 F (36.7 C),  temperature source Oral, resp. rate 20, height 5\' 11"  (1.803 m), weight 138.3 kg (304 lb 14.3 oz), SpO2 96.00%.   Weight has dropped from 317-304 pounds. His lungs are now clear. He was in sinus rhythm on discharge, no S3  Labs: CBC:   Lab Results  Component Value Date   WBC 9.9 02/03/2014   HGB 13.0 02/03/2014   HCT 37.8* 02/03/2014   MCV 88.1 02/03/2014   PLT 239 02/03/2014    CMP:  Recent Labs Lab 02/01/14 0022  02/03/14 0307  NA 144  < > 139  K 3.7  < > 3.2*  CL 105  < > 99  CO2 24  < > 26  BUN 15  < > 17  CREATININE 1.00  < > 1.10  CALCIUM 8.5  < > 8.6  PROT 6.3  --   --   BILITOT 0.3  --   --   ALKPHOS 72  --   --   ALT 45  --   --   AST 35  --   --   GLUCOSE 113*  < > 108*  < > = values in this interval not displayed.  Lipid Panel     Component Value Date/Time   CHOL 145 02/01/2014 0320   TRIG 172* 02/01/2014 0320   HDL 38* 02/01/2014 0320   CHOLHDL 3.8 02/01/2014 0320   VLDL 34 02/01/2014 0320   LDLCALC 73 02/01/2014 0320    Cardiac Enzymes:  Recent Labs  02/01/14 0925  TROPONINI 2.09*    BNP (last 3 results)  Recent Labs  01/31/14 1726  PROBNP 2718.0*    Thyroid: Lab Results  Component Value Date   TSH 1.997 02/01/2014    Hemoglobin A1C: Lab Results  Component Value Date   HGBA1C 5.8* 02/01/2014   ECHO EF 30-35%. Clinically difficult. Anteroseptal, anterior and apical hypokinesis noted.   Radiology: Borderline cardiomegaly, increased interstitial markings  EKG: Sinus rhythm with right bundle branch block, lateral ischemia  Discharge Medications:   Medication List    STOP taking these medications       dimenhyDRINATE 50 MG tablet  Commonly known as:  DRAMAMINE     loperamide 2 MG tablet  Commonly known as:  IMODIUM A-D     metoprolol 50 MG tablet  Commonly known as:  LOPRESSOR      TAKE these medications       aspirin 81 MG EC tablet  Take 1 tablet (81 mg total) by mouth daily. Swallow whole.     carvedilol 6.25 MG tablet   Commonly known as:  COREG  Take 1 tablet (6.25 mg total) by mouth 2 (two) times daily with a meal.     furosemide 40 MG tablet  Commonly known as:  LASIX  Take 1 tablet (40 mg total) by mouth daily.     lisinopril 10 MG tablet  Commonly known as:  PRINIVIL,ZESTRIL  Take 1 tablet (10 mg total) by mouth daily.     nitroGLYCERIN 0.4 MG SL tablet  Commonly known as:  NITROSTAT  Place 1 tablet (0.4 mg total) under the tongue every 5 (five) minutes as needed for chest pain.     potassium chloride SA 20  MEQ tablet  Commonly known as:  K-DUR,KLOR-CON  Take 2 tablets (40 mEq total) by mouth daily.     pravastatin 40 MG tablet  Commonly known as:  PRAVACHOL  Take 1 tablet (40 mg total) by mouth daily.     Rivaroxaban 20 MG Tabs tablet  Commonly known as:  XARELTO  Take 1 tablet (20 mg total) by mouth daily with supper.       Followup plans and appointments: Extensive discharge planning was done with the patient. He was seen by the Child psychotherapist as well as the case manager to try to help him arrange followup with the [facility] with extensive resource planning and instructions given to him.  He will need to have medical followup within one week. He was given an appointment to see me in one week although he was adamant that he wanted to followup with the VA.  Time spent with patient to include physician time:  45 minutes.   Signed: Darden Palmer. MD [facility] 02/04/2014, 8:40 AM

## 2014-02-04 NOTE — Progress Notes (Signed)
Pt discharging today, RNCM provided free xarelto card until his appointment at the [place] on 02/13/14. CSW signing off.   Maryclare Labrador, MSW, [facility] Clinical Social Worker [phone]

## 2014-02-04 NOTE — Progress Notes (Signed)
Pt education complete; with family at bedside; no further questions at this time; pt acknowledges when to call MD & 911; pt belongings all given back to pt; emotional support given

## 2014-02-11 NOTE — ED Provider Notes (Signed)
I saw and evaluated the patient, reviewed the resident's note and I agree with the findings and plan.   EKG Interpretation   Date/Time:  Friday January 31 2014 17:14:04 EST Ventricular Rate:  78 PR Interval:  169 QRS Duration: 140 QT Interval:  392 QTC Calculation: 446 R Axis:   -30 Text Interpretation:  Sinus rhythm Supraventricular bigeminy Right bundle  branch block Anteroseptal infarct, age indeterminate Lateral leads are  also involved ED PHYSICIAN INTERPRETATION AVAILABLE IN [facility]  Confirmed by TEST, Record ([id number]) on 02/03/2014 2:27:24 PM      Pt seen and examined independently.  Discussed with resident.  Pt remains dyspneic.  EKG without acute changes, but Troponin elevated.  Agree with treatment, and disposition   Rolland Porter, MD 02/11/14 [phone]

## 2014-08-11 ENCOUNTER — Other Ambulatory Visit: Payer: Self-pay | Admitting: Internal Medicine

## 2014-11-11 ENCOUNTER — Other Ambulatory Visit: Payer: Self-pay | Admitting: Internal Medicine

## 2014-12-22 ENCOUNTER — Encounter (HOSPITAL_COMMUNITY): Payer: Self-pay | Admitting: Emergency Medicine

## 2014-12-22 ENCOUNTER — Emergency Department (HOSPITAL_COMMUNITY)
Admission: EM | Admit: 2014-12-22 | Discharge: 2014-12-22 | Discharge status: Against Medical Advice | Payer: Non-veteran care | Attending: Emergency Medicine | Admitting: Emergency Medicine

## 2014-12-22 ENCOUNTER — Emergency Department (HOSPITAL_COMMUNITY): Payer: Non-veteran care

## 2014-12-22 ENCOUNTER — Other Ambulatory Visit: Payer: Self-pay | Admitting: Cardiology

## 2014-12-22 DIAGNOSIS — M1990 Unspecified osteoarthritis, unspecified site: Secondary | ICD-10-CM | POA: Diagnosis not present

## 2014-12-22 DIAGNOSIS — Z87442 Personal history of urinary calculi: Secondary | ICD-10-CM | POA: Insufficient documentation

## 2014-12-22 DIAGNOSIS — I509 Heart failure, unspecified: Secondary | ICD-10-CM | POA: Diagnosis not present

## 2014-12-22 DIAGNOSIS — E785 Hyperlipidemia, unspecified: Secondary | ICD-10-CM | POA: Diagnosis not present

## 2014-12-22 DIAGNOSIS — Z87891 Personal history of nicotine dependence: Secondary | ICD-10-CM | POA: Diagnosis not present

## 2014-12-22 DIAGNOSIS — R0602 Shortness of breath: Secondary | ICD-10-CM | POA: Diagnosis present

## 2014-12-22 DIAGNOSIS — R7989 Other specified abnormal findings of blood chemistry: Secondary | ICD-10-CM | POA: Insufficient documentation

## 2014-12-22 DIAGNOSIS — E669 Obesity, unspecified: Secondary | ICD-10-CM | POA: Insufficient documentation

## 2014-12-22 DIAGNOSIS — Z79899 Other long term (current) drug therapy: Secondary | ICD-10-CM | POA: Diagnosis not present

## 2014-12-22 DIAGNOSIS — Z880 Allergy status to penicillin: Secondary | ICD-10-CM | POA: Insufficient documentation

## 2014-12-22 DIAGNOSIS — Z7982 Long term (current) use of aspirin: Secondary | ICD-10-CM | POA: Diagnosis not present

## 2014-12-22 DIAGNOSIS — R778 Other specified abnormalities of plasma proteins: Secondary | ICD-10-CM

## 2014-12-22 DIAGNOSIS — I4891 Unspecified atrial fibrillation: Secondary | ICD-10-CM | POA: Insufficient documentation

## 2014-12-22 DIAGNOSIS — R609 Edema, unspecified: Secondary | ICD-10-CM | POA: Diagnosis not present

## 2014-12-22 DIAGNOSIS — Z8719 Personal history of other diseases of the digestive system: Secondary | ICD-10-CM | POA: Diagnosis not present

## 2014-12-22 DIAGNOSIS — I480 Paroxysmal atrial fibrillation: Secondary | ICD-10-CM

## 2014-12-22 DIAGNOSIS — I5021 Acute systolic (congestive) heart failure: Secondary | ICD-10-CM

## 2014-12-22 DIAGNOSIS — I5022 Chronic systolic (congestive) heart failure: Secondary | ICD-10-CM

## 2014-12-22 LAB — CBC
HCT: 39.8 % (ref 39.0–52.0)
Hemoglobin: 13.4 g/dL (ref 13.0–17.0)
MCH: 29.5 pg (ref 26.0–34.0)
MCHC: 33.7 g/dL (ref 30.0–36.0)
MCV: 87.7 fL (ref 78.0–100.0)
Platelets: 262 K/uL (ref 150–400)
RBC: 4.54 MIL/uL (ref 4.22–5.81)
RDW: 13.9 % (ref 11.5–15.5)
WBC: 10.8 K/uL — ABNORMAL HIGH (ref 4.0–10.5)

## 2014-12-22 LAB — BASIC METABOLIC PANEL WITH GFR
Anion gap: 12 (ref 5–15)
BUN: 22 mg/dL (ref 6–23)
CO2: 23 mmol/L (ref 19–32)
Calcium: 8.8 mg/dL (ref 8.4–10.5)
Chloride: 103 meq/L (ref 96–112)
Creatinine, Ser: 1.3 mg/dL (ref 0.50–1.35)
GFR calc Af Amer: 65 mL/min — ABNORMAL LOW
GFR calc non Af Amer: 56 mL/min — ABNORMAL LOW
Glucose, Bld: 111 mg/dL — ABNORMAL HIGH (ref 70–99)
Potassium: 4 mmol/L (ref 3.5–5.1)
Sodium: 138 mmol/L (ref 135–145)

## 2014-12-22 LAB — TROPONIN I: Troponin I: 2.03 ng/mL

## 2014-12-22 MED ORDER — FUROSEMIDE 40 MG PO TABS: 40.0000 mg | ORAL_TABLET | Freq: Two times a day (BID) | ORAL | Status: DC

## 2014-12-22 MED ORDER — DILTIAZEM HCL 100 MG IV SOLR
5.0000 mg/h | INTRAVENOUS | Status: DC
  Administered 2014-12-22: 5 mg/h via INTRAVENOUS

## 2014-12-22 MED ORDER — FUROSEMIDE 10 MG/ML IJ SOLN
40.0000 mg | Freq: Once | INTRAMUSCULAR | Status: AC
  Administered 2014-12-22: 40 mg via INTRAVENOUS
  Filled 2014-12-22: qty 4

## 2014-12-22 MED ORDER — HEPARIN (PORCINE) IN NACL 100-0.45 UNIT/ML-% IJ SOLN
2000.0000 [IU]/h | INTRAMUSCULAR | Status: DC
  Filled 2014-12-22 (×2): qty 250

## 2014-12-22 MED ORDER — CARVEDILOL 6.25 MG PO TABS: 6.2500 mg | ORAL_TABLET | Freq: Two times a day (BID) | ORAL | Status: DC

## 2014-12-22 MED ORDER — PRAVASTATIN SODIUM 40 MG PO TABS: 40.0000 mg | ORAL_TABLET | Freq: Every day | ORAL | Status: DC

## 2014-12-22 MED ORDER — POTASSIUM CHLORIDE CRYS ER 20 MEQ PO TBCR: 40.0000 meq | EXTENDED_RELEASE_TABLET | Freq: Every day | ORAL | Status: DC

## 2014-12-22 MED ORDER — NITROGLYCERIN 0.4 MG SL SUBL: 0.4000 mg | SUBLINGUAL_TABLET | SUBLINGUAL | Status: DC | PRN

## 2014-12-22 MED ORDER — RIVAROXABAN 20 MG PO TABS: 20.0000 mg | ORAL_TABLET | Freq: Every day | ORAL | Status: DC

## 2014-12-22 MED ORDER — HEPARIN BOLUS VIA INFUSION
4000.0000 [IU] | Freq: Once | INTRAVENOUS | Status: DC
  Filled 2014-12-22: qty 4000

## 2014-12-22 MED ORDER — LISINOPRIL 10 MG PO TABS: 10.0000 mg | ORAL_TABLET | Freq: Every day | ORAL | Status: DC

## 2014-12-22 MED ORDER — DILTIAZEM LOAD VIA INFUSION
10.0000 mg | Freq: Once | INTRAVENOUS | Status: AC
Start: 2014-12-22 — End: 2014-12-22
  Administered 2014-12-22: 10 mg via INTRAVENOUS
  Filled 2014-12-22 (×2): qty 10

## 2014-12-22 NOTE — ED Notes (Signed)
Pt states he became short of breath on Friday. Worse on exertion. Pt very short of breath walking back to triage. Pt has hx of Afib was taking blood thinner "stopped in summer"

## 2014-12-22 NOTE — H&P (Signed)
Patient ID: Charles Mckinney MRN: [id number], DOB/AGE: [id number]   Admit date: 12/22/2014   Primary Physician: Default, Provider, MD Primary Cardiologist: Dr. Donnie Aho  Pt. Profile:  64 y/o male with h/o medical noncompliance, systolic CHF, PAF, HTN and HLD presenting to [facility] ER with complaints of dyspnea, in the setting of Afib w/ RVR.   Problem List  Past Medical History  Diagnosis Date  . Hyperlipidemia   . Hypertensive heart disease   . Obesity (BMI 30-39.9)   . GERD (gastroesophageal reflux disease)   . Kidney stones   . Osteoarthritis   . A-fib     Past Surgical History  Procedure Laterality Date  . Cataract extraction       Allergies  Allergies  Allergen Reactions  . Penicillins Other (See Comments)    unknown    HPI  The patient is a 64 y/o male followed by Dr. Donnie Aho. He also receives care at the [facility] hospital in [place]. His PMH is significant for systolic CHF, PAF, HTN, HLD and obesity. He also has a history of medical noncompliance. He was last admitted to [facility] in March of 2015 for acute CHF and elevated troponins. During that admission, a 2D echo revealed reduced systolic function with an EF of 30-35% with anterior, anteroseptal and apical hypokinesis. Per discharge summary, he refused to undergo diagnostic LHC here at [facility] and stated that he would follow-up at the [facility] for further w/u. Also notable that admission was the development of atrial fibrillation with RVR. He was treated with IV amiodarone and converted to SR. He was placed on Xarelto for anticoagulation. The patient voiced that he wanted to continue further testing at the [facility] hospital.   Since that time, he reports that he has failed to f/u at the [facility] for a LHC. He has not returned to see Dr. Donnie Aho, because he does not have supplemental insurance. He states that he discontinued Xarelto this past summer due to hematuria.   He presents back to [facility] today with complaints of dyspnea. Symptoms onset was 2 days  ago and has progressively worsened. Mostly occurs with exertion. He also notes associated orthopnea/PND. No increased LEE. He notes exertional chest pain if he "over does it". He denies chest pain at rest. His exertional CP in the past has been relieved with rest and SL NTG. He reports full medication compliance. He denies any awareness of  palpitations, no dizziness, syncope/ near syncope. No recent fever, chills, n/v.   On arrival to the ED, he was noted to be in atrial fibrillation w/ RVR and started on Cardizem. Ventricular rate on arrival in the 140s. CXR is consistent with CHF with cardiomegaly and mild interstitial edema. Troponin is elevated at 2.03. WBC minimally elevated at 10.8. BNP pending.   The patient is against admission and is adamant that he wants to leave and f/u at the [place].     Home Medications  Prior to Admission medications   Medication Sig Start Date End Date Taking? Authorizing Provider  aspirin 81 MG EC tablet Take 1 tablet (81 mg total) by mouth daily. Swallow whole. 09/03/12  Yes [name], MD  carvedilol (COREG) 6.25 MG tablet Take 1 tablet (6.25 mg total) by mouth 2 (two) times daily with a meal. 02/04/14  Yes Othella Boyer, MD  cholecalciferol (VITAMIN D) 1000 UNITS tablet Take 1,000 Units by mouth daily.   Yes Historical Provider, MD  FLUVIRIN SUSP Inject 0.5 mLs as directed once. 11/03/14  Yes Historical Provider, MD  furosemide (LASIX) 40 MG tablet Take 1 tablet (40 mg total) by mouth daily. 02/04/14  Yes Othella Boyer, MD  lisinopril (PRINIVIL,ZESTRIL) 10 MG tablet Take 1 tablet (10 mg total) by mouth daily. 02/04/14  Yes Othella Boyer, MD  nitroGLYCERIN (NITROSTAT) 0.4 MG SL tablet Place 1 tablet (0.4 mg total) under the tongue every 5 (five) minutes as needed for chest pain. 02/04/14  Yes Othella Boyer, MD  potassium chloride SA (K-DUR,KLOR-CON) 20 MEQ tablet Take 2 tablets (40 mEq total) by mouth daily. 02/04/14  Yes Othella Boyer, MD  pravastatin  (PRAVACHOL) 40 MG tablet TAKE ONE TABLET BY MOUTH EVERY DAY 08/12/14  Yes [name], MD  Rivaroxaban (XARELTO) 20 MG TABS tablet Take 1 tablet (20 mg total) by mouth daily with supper. Patient not taking: Reported on 12/22/2014 02/04/14   Othella Boyer, MD    Family History  Family History  Problem Relation Age of Onset  . Diabetes Mother   . Heart disease Father   . Stroke Brother   . Heart disease Brother   . Diabetes Brother     Social History  History   Social History  . Marital Status: Widowed    Spouse Name: N/A    Number of Children: N/A  . Years of Education: N/A   Occupational History  . Not on file.   Social History Main Topics  . Smoking status: Former Games developer  . Smokeless tobacco: Never Used  . Alcohol Use: No  . Drug Use: No  . Sexual Activity: Yes   Other Topics Concern  . Not on file   Social History Narrative     Review of Systems General:  No chills, fever, night sweats or weight changes.  Cardiovascular:  No chest pain, dyspnea on exertion, edema, orthopnea, palpitations, paroxysmal nocturnal dyspnea. Dermatological: No rash, lesions/masses Respiratory: No cough, dyspnea Urologic: No hematuria, dysuria Abdominal:   No nausea, vomiting, diarrhea, bright red blood per rectum, melena, or hematemesis Neurologic:  No visual changes, wkns, changes in mental status. All other systems reviewed and are otherwise negative except as noted above.  Physical Exam  Blood pressure 109/71, pulse 52, temperature 97.8 F (36.6 C), temperature source Oral, resp. rate 11, height  (1.778 m), weight 327 lb (148.326 kg), SpO2 96 %.  General: Pleasant, NAD, obese Psych: Normal affect. Neuro: Alert and oriented X 3. Moves all extremities spontaneously. HEENT: Normal  Neck: Supple without bruits or JVD. Lungs:  Resp regular and unlabored, CTA. Heart: Irregularly irregular rhythm no s3, s4, or murmurs. Abdomen: Soft, non-tender, non-distended, BS + x  4.  Extremities: No clubbing or cyanosis. 1 + bilateral LEE edema. DP/PT/Radials 2+ and equal bilaterally.  Labs  Troponin (Point of Care Test) No results for input(s): TROPIPOC in the last 72 hours.  Recent Labs  12/22/14 1236  TROPONINI 2.03*   Lab Results  Component Value Date   WBC 10.8* 12/22/2014   HGB 13.4 12/22/2014   HCT 39.8 12/22/2014   MCV 87.7 12/22/2014   PLT 262 12/22/2014    Recent Labs Lab 12/22/14 1236  NA 138  K 4.0  CL 103  CO2 23  BUN 22  CREATININE 1.30  CALCIUM 8.8  GLUCOSE 111*   Lab Results  Component Value Date   CHOL 145 02/01/2014   HDL 38* 02/01/2014   LDLCALC 73 02/01/2014   TRIG 172* 02/01/2014   Lab Results  Component Value Date  DDIMER 0.28 01/31/2014     Radiology/Studies  Dg Chest 2 View  12/22/2014   CLINICAL DATA:  Shortness of breath.  Chest pain.  EXAM: CHEST  2 VIEW  COMPARISON:  None.  FINDINGS: Mediastinum and hilar structures normal. Cardiomegaly with pulmonary vascular prominence and bilateral interstitial prominence. Small pleural effusion. No pneumothorax. Degenerative changes thoracic spine.  IMPRESSION: Congestive heart failure with mild interstitial edema.   Electronically Signed   By: Maisie Fus  Register   On: 12/22/2014 14:07    ECG  Atrial fibrillation w/ RVR 136 bpm  Echocardiogram 02/01/14  Study Conclusions  - Left ventricle: The cavity size was normal. Wall thickness was increased in a pattern of mild LVH. Systolic function was moderately to severely reduced. The estimated ejection fraction was in the range of 30% to 35%. Features are consistent with a pseudonormal left ventricular filling pattern, with concomitant abnormal relaxation and increased filling pressure (grade 2 diastolic dysfunction). - Aortic valve: There was no stenosis. - Mitral valve: No significant regurgitation. - Left atrium: The atrium was mildly dilated. - Right ventricle: Poorly visualized. The cavity size  was normal. Systolic function was normal. - Pulmonary arteries: No complete TR doppler jet so unable to estimate PA systolic pressure. - Systemic veins: IVC measured 2.4 cm with > 50% respirophasic variation, suggesting RA pressure 8 mmHg. Impressions:  - Technically difficult study with poor acoustic windows. Definity contrast would have helped. Normal LV size with mild LV hypertrophy. EF 30-35% with regional wall motion abnormalities as noted above. Moderate diastolic dysfunction. No significant valvular abnormalities. The RV was probably normal.    ASSESSMENT AND PLAN  1. Atrial Fibrillation w/ RVR: 2. Systolic CHF 3. Medical Noncompliance  1. Atrial Fibrillation w/ RVR: Patient now on IV Cardizem. Rate has improved some but continues to fluctuate from the upper 90s to 120s. BP is stable. He is fairly asymptomatic at rest. May need to increase home BB for additional rate control. Recommend checking TSH. He was on Xarelto in the past but this was discontinued more than 6 months ago due to hematuria. Recommend continuation of daily ASA.  2. Acute on Chronic Systolic CHF: slightly volume overloaded on exam. BNP pending. IV lasix given in ED. He has known systolic function with last echo 01/2014 demonstrating reduced EF of 30-35% and WMA. He refused LHC at that time. It was recommended, once again, that he under LHC to r/o CAD. If patient later agrees to admission, will continue IV Lasix, daily weights and low sodium diet. Continue Coreg and lisinopril. LHC if patient agrees.   3. Elevated Troponin: 2.03. Possibly from demand ischemia from his rapid afib. However, could be possible type I NSTEMI. Will cycle cardiac enzymes to assess trend.  As mentioned above, ischemic eval recommended to r/o CAD given his history and known systolic dysfunction.   Admission has been recommended for further treatment of his afib and additional testing, however the patient is very adamant that  he does not want to stay. Patient was notified of potential risk of leaving AMA. Patient asked to stay until seen and evaluated by MD.    Signed, Robbie Lis, PA-C 12/22/2014, 2:48 PM  Unfortunate patient of Dr Donnie Aho.  Letting his health deteriorate due to financial concerns.  Admitted with CHF, volume overload and afib.  Feels better after iv lasix.  He had been on xarelto but can't affort to fill script at [facility].  Hematuria does not appear to have been evaluated but has not recurred.  His primary at [facility] advised him to resume med.  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  Does not think his care will make it for f/u appts.  Insists on leaving AMA.  [facility].  Has xarelto at home and will start taking along with bid lasix.  Have called refill for meds into [facility] at [facility].  He understands risk of death, CVA and worsening congestive failure by leaving.  Exam remarkable for obes white male with basilar rales and plus two LE edema bilaterally.  ECG with rapid afib and no acute ST/T wave changes.  Telemetry with rapid afib   Charlton Haws .

## 2014-12-22 NOTE — ED Notes (Signed)
Pt refusing medical treatment, want to go home and call [facility] in the morning.  Pt was told about medical findings by MD and explained risks of not allowing further medical treatment.  Pt continues to refuse.  Pt signed AMA and is getting dressed at this time.  Cardiologist at bedside.

## 2014-12-22 NOTE — ED Notes (Signed)
Let Dr Judd Lien know critical troponin level

## 2014-12-22 NOTE — ED Notes (Signed)
Denies chest pain.  Pt has hx of AFIB

## 2014-12-22 NOTE — ED Notes (Signed)
Pt back from x-ray.

## 2014-12-22 NOTE — Progress Notes (Signed)
ANTICOAGULATION CONSULT NOTE - Initial Consult  Pharmacy Consult for Heparin Indication: ACS/STEMI  Allergies  Allergen Reactions  . Penicillins Other (See Comments)    unknown    Patient Measurements: Height: 5\' 10"  (177.8 cm) Weight: (!) 327 lb (148.326 kg) IBW/kg (Calculated) : 73 Heparin Dosing Weight: 108 kg  Vital Signs: Temp: 97.8 F (36.6 C) (01/18 1231) Temp Source: Oral (01/18 1231) BP: 105/62 mmHg (01/18 1500) Pulse Rate: 63 (01/18 1500)  Labs:  Recent Labs  12/22/14 1236  HGB 13.4  HCT 39.8  PLT 262  CREATININE 1.30  TROPONINI 2.03*    Estimated Creatinine Clearance: 83.7 mL/min (by C-G formula based on Cr of 1.3).   Medical History: Past Medical History  Diagnosis Date  . Hyperlipidemia   . Hypertensive heart disease   . Obesity (BMI 30-39.9)   . GERD (gastroesophageal reflux disease)   . Kidney stones   . Osteoarthritis   . A-fib     Medications:   (Not in a hospital admission) Scheduled:   Infusions:  . diltiazem (CARDIZEM) infusion 15 mg/hr (12/22/14 1450)   Assessment:  32 YOM w/ PMH of medical noncompliance, systolic CHF, PAF, HTN, AND HLD presenting to [facility] on 12/22/14 for SOB.  Pharmacy has been consulted to dose heparin in the setting of ACS/STEMI.   Patient was previously on xarelto for atrial fibrillation but discontinued to due hematouria (per patient).  His last dose of xarelto was over 6 months ago.    H/H wnl, plt wnl. Troponin 2.03.   Previously therapeutic at heparin rate of 2100 units/hr in 01/31/2015.  Will dose cautiously at a slightly lower rate.  Patient was 140 kg at that time.   Goal of Therapy:  Heparin level 0.3-0.7 units/ml Monitor platelets by anticoagulation protocol: Yes   Plan:  - Heparin bolus 4000 units x 1 - Heparin 2000 units/hr - Heparin level 6 hours from start of infusion - Daily CBC and HL - Monitor for signs and symptoms of bleeding  Red Christians, Pharm. D. Clinical Pharmacy  Resident Pager: [phone] Ph: [phone] 12/22/2014 4:00 PM

## 2014-12-22 NOTE — ED Notes (Signed)
Called pharmacy about pt meds

## 2014-12-22 NOTE — ED Provider Notes (Addendum)
CSN: [id number]     Arrival date & time 12/22/14  1226 History   First MD Initiated Contact with Patient 12/22/14 1251     Chief Complaint  Patient presents with  . Shortness of Breath     (Consider location/radiation/quality/duration/timing/severity/associated sxs/prior Treatment) HPI Comments: Patient is a 64 year old male with history of obesity, hypertension, and atrial fibrillation. He presents today with complaints of dyspnea on exertion which has been worsening over the past 3 days. He denies any fevers, chills, or productive cough. He denies any chest pain. He denies any palpitations.  Patient has seen Dr. Donnie Aho in the past, however gets the majority of his care through the [facility] system.  Patient is a 64 y.o. male presenting with shortness of breath. The history is provided by the patient.  Shortness of Breath Severity:  Moderate Onset quality:  Gradual Duration:  3 days Timing:  Constant Progression:  Worsening Chronicity:  New Context: activity   Context: not URI   Relieved by:  Nothing Worsened by:  Activity, exertion and movement Ineffective treatments:  None tried Associated symptoms: no chest pain     Past Medical History  Diagnosis Date  . Hyperlipidemia   . Hypertensive heart disease   . Obesity (BMI 30-39.9)   . GERD (gastroesophageal reflux disease)   . Kidney stones   . Osteoarthritis   . A-fib    Past Surgical History  Procedure Laterality Date  . Cataract extraction     Family History  Problem Relation Age of Onset  . Diabetes Mother   . Heart disease Father   . Stroke Brother   . Heart disease Brother   . Diabetes Brother    History  Substance Use Topics  . Smoking status: Former Games developer  . Smokeless tobacco: Never Used  . Alcohol Use: No    Review of Systems  Respiratory: Positive for shortness of breath.   Cardiovascular: Negative for chest pain.  All other systems reviewed and are negative.     Allergies  Penicillins  Home  Medications   Prior to Admission medications   Medication Sig Start Date End Date Taking? Authorizing Provider  aspirin 81 MG EC tablet Take 1 tablet (81 mg total) by mouth daily. Swallow whole. 09/03/12   [name], MD  carvedilol (COREG) 6.25 MG tablet Take 1 tablet (6.25 mg total) by mouth 2 (two) times daily with a meal. 02/04/14   Othella Boyer, MD  furosemide (LASIX) 40 MG tablet Take 1 tablet (40 mg total) by mouth daily. 02/04/14   Othella Boyer, MD  lisinopril (PRINIVIL,ZESTRIL) 10 MG tablet Take 1 tablet (10 mg total) by mouth daily. 02/04/14   Othella Boyer, MD  nitroGLYCERIN (NITROSTAT) 0.4 MG SL tablet Place 1 tablet (0.4 mg total) under the tongue every 5 (five) minutes as needed for chest pain. 02/04/14   Othella Boyer, MD  potassium chloride SA (K-DUR,KLOR-CON) 20 MEQ tablet Take 2 tablets (40 mEq total) by mouth daily. 02/04/14   Othella Boyer, MD  pravastatin (PRAVACHOL) 40 MG tablet TAKE ONE TABLET BY MOUTH EVERY DAY 08/12/14   [name], MD  Rivaroxaban (XARELTO) 20 MG TABS tablet Take 1 tablet (20 mg total) by mouth daily with supper. 02/04/14   Othella Boyer, MD   BP 138/114 mmHg  Pulse 116  Temp(Src) 97.8 F (36.6 C) (Oral)  Resp 20  Ht 5\' 10"  (1.778 m)  Wt 327 lb (148.326 kg)  BMI 46.92 kg/m2  SpO2 95% Physical Exam  Constitutional: He is oriented to person, place, and time. He appears well-developed and well-nourished. No distress.  HENT:  Head: Normocephalic and atraumatic.  Mouth/Throat: Oropharynx is clear and moist.  Neck: Normal range of motion. Neck supple.  Cardiovascular: Normal rate, regular rhythm and normal heart sounds.   No murmur heard. Pulmonary/Chest: Effort normal and breath sounds normal. No respiratory distress. He has no wheezes.  Abdominal: Soft. Bowel sounds are normal. He exhibits no distension. There is no tenderness.  Musculoskeletal: Normal range of motion. He exhibits edema.  There is 2-3+ bilateral lower  extremity pitting edema.  Neurological: He is alert and oriented to person, place, and time.  Skin: Skin is warm and dry. He is not diaphoretic.  Nursing note and vitals reviewed.   ED Course  Procedures (including critical care time) Labs Review Labs Reviewed  BASIC METABOLIC PANEL  CBC  TROPONIN I  BRAIN NATRIURETIC PEPTIDE    Imaging Review No results found.   EKG Interpretation   Date/Time:  Monday December 22 2014 12:32:15 EST Ventricular Rate:  136 PR Interval:    QRS Duration: 86 QT Interval:  328 QTC Calculation: 493 R Axis:   -49 Text Interpretation:  Atrial fibrillation with rapid ventricular response  with premature ventricular or aberrantly conducted complexes Left axis  deviation RSR' or QR pattern in V1 suggests right ventricular conduction  delay ST \\T \ T wave abnormality, consider lateral ischemia or digitalis  effect Abnormal ECG Confirmed by DELOS  MD, Adal Sereno ([id number]) on 12/22/2014  1:05:20 PM      MDM   Final diagnoses:  None    Patient presents here with complaints of shortness of breath. His workup reveals atrial fibrillation with RVR, chest x-ray shows CHF, and troponin is elevated. I am uncertain as to whether this elevation in troponin represents a true ischemic event or demand ischemia, however I believe his clinical picture deserves further workup. I have discussed the case with cardiology who has evaluated and plans to admit the patient. He was started on a Cardizem drip as well as heparin.  Shortly after being evaluated by cardiology, the patient decided that he did not want to stay in the hospital, explaining to me that if he missed work this evening he would be fired. I explained to him the importance of staying in the hospital to have these issues sorted out, however he continued to refuse. He understands that leaving the hospital can result in a high likelihood of death or serious disability and is willing to assume these risks. He is  informed me that he will follow-up with his cardiologist at the [facility] facility tomorrow. I offered to have him transferred there, however he insisted that he could not miss work. This conversation was witnessed by [employer]. It sounds as though cardiology had a similar conversation with the patient as well.  CRITICAL CARE Performed by: Geoffery Lyons Total critical care time: 45 minutes Critical care time was exclusive of separately billable procedures and treating other patients. Critical care was necessary to treat or prevent imminent or life-threatening deterioration. Critical care was time spent personally by me on the following activities: development of treatment plan with patient and/or surrogate as well as nursing, discussions with consultants, evaluation of patient's response to treatment, examination of patient, obtaining history from patient or surrogate, ordering and performing treatments and interventions, ordering and review of laboratory studies, ordering and review of radiographic studies, pulse oximetry and re-evaluation of patient's condition.  Geoffery Lyons, MD 12/22/14 1646  Geoffery Lyons, MD 12/22/14 [phone]

## 2015-01-12 ENCOUNTER — Emergency Department (HOSPITAL_COMMUNITY): Payer: Non-veteran care

## 2015-01-12 ENCOUNTER — Inpatient Hospital Stay (HOSPITAL_COMMUNITY)
Admission: EM | Admit: 2015-01-12 | Discharge: 2015-01-20 | DRG: 308 | Disposition: A | Discharge status: Home / Self Care | Payer: Non-veteran care | Attending: Internal Medicine | Admitting: Internal Medicine

## 2015-01-12 ENCOUNTER — Encounter (HOSPITAL_COMMUNITY): Payer: Self-pay | Admitting: Emergency Medicine

## 2015-01-12 DIAGNOSIS — Z8249 Family history of ischemic heart disease and other diseases of the circulatory system: Secondary | ICD-10-CM

## 2015-01-12 DIAGNOSIS — I4891 Unspecified atrial fibrillation: Secondary | ICD-10-CM

## 2015-01-12 DIAGNOSIS — K219 Gastro-esophageal reflux disease without esophagitis: Secondary | ICD-10-CM | POA: Diagnosis present

## 2015-01-12 DIAGNOSIS — I4510 Unspecified right bundle-branch block: Secondary | ICD-10-CM | POA: Diagnosis present

## 2015-01-12 DIAGNOSIS — I5021 Acute systolic (congestive) heart failure: Secondary | ICD-10-CM

## 2015-01-12 DIAGNOSIS — I2510 Atherosclerotic heart disease of native coronary artery without angina pectoris: Secondary | ICD-10-CM | POA: Diagnosis present

## 2015-01-12 DIAGNOSIS — E785 Hyperlipidemia, unspecified: Secondary | ICD-10-CM | POA: Diagnosis present

## 2015-01-12 DIAGNOSIS — M109 Gout, unspecified: Secondary | ICD-10-CM | POA: Diagnosis not present

## 2015-01-12 DIAGNOSIS — Z7982 Long term (current) use of aspirin: Secondary | ICD-10-CM

## 2015-01-12 DIAGNOSIS — I509 Heart failure, unspecified: Secondary | ICD-10-CM

## 2015-01-12 DIAGNOSIS — I429 Cardiomyopathy, unspecified: Secondary | ICD-10-CM

## 2015-01-12 DIAGNOSIS — Z6841 Body Mass Index (BMI) 40.0 and over, adult: Secondary | ICD-10-CM

## 2015-01-12 DIAGNOSIS — Z9119 Patient's noncompliance with other medical treatment and regimen: Secondary | ICD-10-CM

## 2015-01-12 DIAGNOSIS — I5043 Acute on chronic combined systolic (congestive) and diastolic (congestive) heart failure: Secondary | ICD-10-CM | POA: Diagnosis present

## 2015-01-12 DIAGNOSIS — Z79899 Other long term (current) drug therapy: Secondary | ICD-10-CM

## 2015-01-12 DIAGNOSIS — N179 Acute kidney failure, unspecified: Secondary | ICD-10-CM | POA: Diagnosis present

## 2015-01-12 DIAGNOSIS — I480 Paroxysmal atrial fibrillation: Principal | ICD-10-CM | POA: Diagnosis present

## 2015-01-12 DIAGNOSIS — I110 Hypertensive heart disease with heart failure: Secondary | ICD-10-CM | POA: Diagnosis present

## 2015-01-12 DIAGNOSIS — Z7901 Long term (current) use of anticoagulants: Secondary | ICD-10-CM

## 2015-01-12 DIAGNOSIS — I4819 Other persistent atrial fibrillation: Secondary | ICD-10-CM | POA: Diagnosis present

## 2015-01-12 DIAGNOSIS — I119 Hypertensive heart disease without heart failure: Secondary | ICD-10-CM | POA: Diagnosis present

## 2015-01-12 DIAGNOSIS — Z87891 Personal history of nicotine dependence: Secondary | ICD-10-CM

## 2015-01-12 DIAGNOSIS — M25579 Pain in unspecified ankle and joints of unspecified foot: Secondary | ICD-10-CM

## 2015-01-12 DIAGNOSIS — I5022 Chronic systolic (congestive) heart failure: Secondary | ICD-10-CM

## 2015-01-12 DIAGNOSIS — J069 Acute upper respiratory infection, unspecified: Secondary | ICD-10-CM | POA: Diagnosis present

## 2015-01-12 HISTORY — DX: Nausea with vomiting, unspecified: R11.2

## 2015-01-12 HISTORY — DX: Other specified postprocedural states: Z98.890

## 2015-01-12 LAB — CBC WITH DIFFERENTIAL/PLATELET
BASOS ABS: 0 10*3/uL (ref 0.0–0.1)
Basophils Relative: 1 % (ref 0–1)
EOS PCT: 1 % (ref 0–5)
Eosinophils Absolute: 0.1 10*3/uL (ref 0.0–0.7)
HEMATOCRIT: 41.8 % (ref 39.0–52.0)
Hemoglobin: 13.4 g/dL (ref 13.0–17.0)
Lymphocytes Relative: 16 % (ref 12–46)
Lymphs Abs: 1.4 10*3/uL (ref 0.7–4.0)
MCH: 29.5 pg (ref 26.0–34.0)
MCHC: 32.1 g/dL (ref 30.0–36.0)
MCV: 91.9 fL (ref 78.0–100.0)
Monocytes Absolute: 0.6 10*3/uL (ref 0.1–1.0)
Monocytes Relative: 7 % (ref 3–12)
Neutro Abs: 6.4 10*3/uL (ref 1.7–7.7)
Neutrophils Relative %: 75 % (ref 43–77)
Platelets: 219 10*3/uL (ref 150–400)
RBC: 4.55 MIL/uL (ref 4.22–5.81)
RDW: 14.1 % (ref 11.5–15.5)
WBC: 8.5 10*3/uL (ref 4.0–10.5)

## 2015-01-12 LAB — TROPONIN I: Troponin I: 0.03 ng/mL (ref <0.031)

## 2015-01-12 LAB — BASIC METABOLIC PANEL
Anion gap: 11 (ref 5–15)
BUN: 19 mg/dL (ref 6–23)
CALCIUM: 9.1 mg/dL (ref 8.4–10.5)
CO2: 24 mmol/L (ref 19–32)
Chloride: 107 mmol/L (ref 96–112)
Creatinine, Ser: 1.38 mg/dL — ABNORMAL HIGH (ref 0.50–1.35)
GFR calc Af Amer: 61 mL/min — ABNORMAL LOW (ref >90)
GFR calc non Af Amer: 53 mL/min — ABNORMAL LOW (ref >90)
Glucose, Bld: 127 mg/dL — ABNORMAL HIGH (ref 70–99)
Potassium: 3.8 mmol/L (ref 3.5–5.1)
SODIUM: 142 mmol/L (ref 135–145)

## 2015-01-12 LAB — I-STAT TROPONIN, ED: Troponin i, poc: 0 ng/mL (ref 0.00–0.08)

## 2015-01-12 LAB — BRAIN NATRIURETIC PEPTIDE: B Natriuretic Peptide: 503.3 pg/mL — ABNORMAL HIGH (ref 0.0–100.0)

## 2015-01-12 MED ORDER — LISINOPRIL 10 MG PO TABS
10.0000 mg | ORAL_TABLET | Freq: Every day | ORAL | Status: DC
  Filled 2015-01-12 (×2): qty 1

## 2015-01-12 MED ORDER — DILTIAZEM LOAD VIA INFUSION
20.0000 mg | Freq: Once | INTRAVENOUS | Status: AC
  Administered 2015-01-12: 20 mg via INTRAVENOUS
  Filled 2015-01-12: qty 20

## 2015-01-12 MED ORDER — FUROSEMIDE 10 MG/ML IJ SOLN
40.0000 mg | Freq: Every day | INTRAMUSCULAR | Status: DC
  Administered 2015-01-13 – 2015-01-14 (×2): 40 mg via INTRAVENOUS
  Filled 2015-01-12 (×3): qty 4

## 2015-01-12 MED ORDER — CARVEDILOL 6.25 MG PO TABS: 6.2500 mg | ORAL_TABLET | ORAL | Status: DC

## 2015-01-12 MED ORDER — CARVEDILOL 12.5 MG PO TABS
12.5000 mg | ORAL_TABLET | Freq: Two times a day (BID) | ORAL | Status: DC
  Administered 2015-01-12 – 2015-01-14 (×4): 12.5 mg via ORAL
  Filled 2015-01-12 (×6): qty 1

## 2015-01-12 MED ORDER — POTASSIUM CHLORIDE CRYS ER 20 MEQ PO TBCR
40.0000 meq | EXTENDED_RELEASE_TABLET | Freq: Every day | ORAL | Status: DC
  Administered 2015-01-12 – 2015-01-20 (×9): 40 meq via ORAL
  Filled 2015-01-12 (×9): qty 2

## 2015-01-12 MED ORDER — ASPIRIN 81 MG PO CHEW: 81.0000 mg | CHEWABLE_TABLET | Freq: Every day | ORAL | Status: DC

## 2015-01-12 MED ORDER — RIVAROXABAN 20 MG PO TABS
20.0000 mg | ORAL_TABLET | Freq: Every day | ORAL | Status: DC
  Filled 2015-01-12: qty 1

## 2015-01-12 MED ORDER — ASPIRIN 81 MG PO CHEW
81.0000 mg | CHEWABLE_TABLET | Freq: Every day | ORAL | Status: DC
  Administered 2015-01-13 – 2015-01-20 (×8): 81 mg via ORAL
  Filled 2015-01-12 (×8): qty 1

## 2015-01-12 MED ORDER — CARVEDILOL 12.5 MG PO TABS
12.5000 mg | ORAL_TABLET | Freq: Two times a day (BID) | ORAL | Status: DC
  Filled 2015-01-12 (×2): qty 1

## 2015-01-12 MED ORDER — MORPHINE SULFATE 2 MG/ML IJ SOLN: 2.0000 mg | INTRAMUSCULAR | Status: DC | PRN

## 2015-01-12 MED ORDER — PNEUMOCOCCAL VAC POLYVALENT 25 MCG/0.5ML IJ INJ
0.5000 mL | INJECTION | INTRAMUSCULAR | Status: AC
  Administered 2015-01-13: 0.5 mL via INTRAMUSCULAR
  Filled 2015-01-12: qty 0.5

## 2015-01-12 MED ORDER — ALBUTEROL SULFATE (2.5 MG/3ML) 0.083% IN NEBU
2.5000 mg | INHALATION_SOLUTION | Freq: Once | RESPIRATORY_TRACT | Status: AC
  Administered 2015-01-12: 2.5 mg via RESPIRATORY_TRACT
  Filled 2015-01-12: qty 3

## 2015-01-12 MED ORDER — RIVAROXABAN 20 MG PO TABS
20.0000 mg | ORAL_TABLET | Freq: Every day | ORAL | Status: DC
  Administered 2015-01-12 – 2015-01-19 (×8): 20 mg via ORAL
  Filled 2015-01-12 (×9): qty 1

## 2015-01-12 MED ORDER — SODIUM CHLORIDE 0.9 % IJ SOLN
3.0000 mL | Freq: Two times a day (BID) | INTRAMUSCULAR | Status: DC
  Administered 2015-01-12 – 2015-01-20 (×13): 3 mL via INTRAVENOUS

## 2015-01-12 MED ORDER — NITROGLYCERIN 0.4 MG SL SUBL: 0.4000 mg | SUBLINGUAL_TABLET | SUBLINGUAL | Status: DC | PRN

## 2015-01-12 MED ORDER — PRAVASTATIN SODIUM 40 MG PO TABS
40.0000 mg | ORAL_TABLET | Freq: Every day | ORAL | Status: DC
  Administered 2015-01-12 – 2015-01-19 (×8): 40 mg via ORAL
  Filled 2015-01-12 (×10): qty 1

## 2015-01-12 MED ORDER — ACETAMINOPHEN 325 MG PO TABS
650.0000 mg | ORAL_TABLET | Freq: Four times a day (QID) | ORAL | Status: DC | PRN
  Administered 2015-01-13 – 2015-01-18 (×3): 650 mg via ORAL
  Filled 2015-01-12 (×3): qty 2

## 2015-01-12 MED ORDER — LABETALOL HCL 5 MG/ML IV SOLN
5.0000 mg | INTRAVENOUS | Status: DC | PRN
  Administered 2015-01-12: 5 mg via INTRAVENOUS
  Filled 2015-01-12: qty 4

## 2015-01-12 MED ORDER — ACETAMINOPHEN 650 MG RE SUPP: 650.0000 mg | Freq: Four times a day (QID) | RECTAL | Status: DC | PRN

## 2015-01-12 MED ORDER — DILTIAZEM HCL 100 MG IV SOLR
5.0000 mg/h | INTRAVENOUS | Status: DC
  Administered 2015-01-12 – 2015-01-15 (×5): 5 mg/h via INTRAVENOUS
  Filled 2015-01-12 (×4): qty 100

## 2015-01-12 MED ORDER — FUROSEMIDE 10 MG/ML IJ SOLN
40.0000 mg | Freq: Once | INTRAMUSCULAR | Status: AC
  Administered 2015-01-12: 40 mg via INTRAVENOUS
  Filled 2015-01-12: qty 4

## 2015-01-12 NOTE — ED Notes (Signed)
Pt sts SOB starting last night; pt denies pain or swelling of legs; pt noted to be tachy at 147bpm

## 2015-01-12 NOTE — ED Notes (Signed)
Attempted report 

## 2015-01-12 NOTE — H&P (Addendum)
[facility] History and Physical  Charles Mckinney AVW:[id number] DOB: 06-08-1951 DOA: 01/12/2015  Referring physician: Emergency Department PCP: Default, Provider, MD  Specialists:   Chief Complaint: SOB  HPI: Charles Mckinney is a 64 y.o. male  With a hx of afib, HLD, CHF, HTN who presents with sob, increased orthopnea. In the ED, pt was found to be in afib with RVR with HR into the 140's. The patient was noted to have BNP of 503 with CXR findings suggestive of mild CHF. The patient was initially continued on cardizem gtt, since weaned off. The patient was given one dose of IV lasix. Hospitalist consulted for consideration for admission.  Of note, pt reports recent URI type symptoms several days to onset of symptoms. No sick contacts. No fevers. Of note, pt has not been taking xarelto because phlebotomist at [facility] told pt that xarelto was "dangerous." No recent bleeding.  Review of Systems:  Per above, the remainder of the 10pt ros reviewed and are neg  Past Medical History  Diagnosis Date  . Hyperlipidemia   . Hypertensive heart disease   . Obesity (BMI 30-39.9)   . GERD (gastroesophageal reflux disease)   . Kidney stones   . Osteoarthritis   . A-fib   . CHF (congestive heart failure)    Past Surgical History  Procedure Laterality Date  . Cataract extraction     Social History:  reports that he has quit smoking. He has never used smokeless tobacco. He reports that he does not drink alcohol or use illicit drugs.  where does patient live--home, ALF, SNF? and with whom if at home?  Can patient participate in ADLs?  Allergies  Allergen Reactions  . Penicillins Other (See Comments)    unknown    Family History  Problem Relation Age of Onset  . Diabetes Mother   . Heart disease Father   . Stroke Brother   . Heart disease Brother   . Diabetes Brother     (be sure to complete)  Prior to Admission medications   Medication Sig Start Date End Date Taking? Authorizing  Provider  aspirin 81 MG EC tablet Take 1 tablet (81 mg total) by mouth daily. Swallow whole. 09/03/12  Yes [name], MD  carvedilol (COREG) 6.25 MG tablet Take 1 tablet (6.25 mg total) by mouth 2 (two) times daily with a meal. 12/22/14  Yes Brittainy Sherlynn Carbon, PA-C  cholecalciferol (VITAMIN D) 1000 UNITS tablet Take 1,000 Units by mouth daily.   Yes Historical Provider, MD  FLUVIRIN SUSP Inject 0.5 mLs as directed once. 11/03/14  Yes Historical Provider, MD  furosemide (LASIX) 40 MG tablet Take 1 tablet (40 mg total) by mouth 2 (two) times daily. 12/22/14  Yes Brittainy Sherlynn Carbon, PA-C  lisinopril (PRINIVIL,ZESTRIL) 10 MG tablet Take 1 tablet (10 mg total) by mouth daily. 12/22/14  Yes Brittainy Sherlynn Carbon, PA-C  nitroGLYCERIN (NITROSTAT) 0.4 MG SL tablet Place 1 tablet (0.4 mg total) under the tongue every 5 (five) minutes as needed for chest pain. 12/22/14  Yes Brittainy Sherlynn Carbon, PA-C  potassium chloride SA (K-DUR,KLOR-CON) 20 MEQ tablet Take 2 tablets (40 mEq total) by mouth daily. 12/22/14  Yes Brittainy Sherlynn Carbon, PA-C  pravastatin (PRAVACHOL) 40 MG tablet Take 1 tablet (40 mg total) by mouth daily. 12/22/14  Yes Brittainy Sherlynn Carbon, PA-C  rivaroxaban (XARELTO) 20 MG TABS tablet Take 1 tablet (20 mg total) by mouth daily with supper. 12/22/14  Yes Brittainy Sherlynn Carbon, PA-C  Physical Exam: Filed Vitals:   01/12/15 1157 01/12/15 1302 01/12/15 1315 01/12/15 1404  BP: 110/77 119/91 124/98 110/76  Pulse: 158 50 80 88  Temp: 98 F (36.7 C)     Resp: 20 21 25 23   SpO2: 93% 95% 94% 95%     General:  Awake, in nad  Eyes: PERRL B  ENT: membranes moist, dentition fair  Neck: trachea midline, neck supple  Cardiovascular: tachycardic, irregularly irregular  Respiratory: normal resp effort, no wheezing  Abdomen: soft, nondistended  Skin: normal skin turgor, no abnormal skin lesions seen  Musculoskeletal: perfused, no clubbing  Psychiatric: mood/affect normal//no auditory/visual  hallucinations  Neurologic: cn2-12 grossly intact, strength/sensation intact  Labs on Admission:  Basic Metabolic Panel:  Recent Labs Lab 01/12/15 1204  NA 142  K 3.8  CL 107  CO2 24  GLUCOSE 127*  BUN 19  CREATININE 1.38*  CALCIUM 9.1   Liver Function Tests: No results for input(s): AST, ALT, ALKPHOS, BILITOT, PROT, ALBUMIN in the last 168 hours. No results for input(s): LIPASE, AMYLASE in the last 168 hours. No results for input(s): AMMONIA in the last 168 hours. CBC:  Recent Labs Lab 01/12/15 1204  WBC 8.5  NEUTROABS 6.4  HGB 13.4  HCT 41.8  MCV 91.9  PLT 219   Cardiac Enzymes: No results for input(s): CKTOTAL, CKMB, CKMBINDEX, TROPONINI in the last 168 hours.  BNP (last 3 results)  Recent Labs  01/12/15 1204  BNP 503.3*    ProBNP (last 3 results)  Recent Labs  01/31/14 1726  PROBNP 2718.0*    CBG: No results for input(s): GLUCAP in the last 168 hours.  Radiological Exams on Admission: Dg Chest 2 View  01/12/2015   CLINICAL DATA:  Shortness of breath  EXAM: CHEST  2 VIEW  COMPARISON:  12/22/2014  FINDINGS: Chronic cardiomegaly and vascular pedicle widening. There is diffuse interstitial opacity with small bilateral pleural effusions, similar to prior. Mild atelectasis is suspected at the bases.  IMPRESSION: Mild CHF.   Electronically Signed   By: Tiburcio Pea M.D.   On: 01/12/2015 13:11    EKG: Independently reviewed. afib RVR  Assessment/Plan Active Problems:   Hyperlipidemia   Morbid obesity   Hypertensive heart disease   GERD   A-fib  1. Acutely decompensated systolic CHF 1. Likely secondary RVR 2. Lasix given in ED, will continue IV lasix 3. Monitor i/o and daily weights 4. Wt on 3/15 of 138.3kg 5. Admit to med tele 2. Afib RVR 1. Improved with cardizem gtt in ed, since weaned off 2. Will cont home meds 3. Increase coreg to 12.5mg  bid 4. Cont on PRN labetalol 5. Continue xarelto 3. Obesity 1. Pt actively trying to lose  weight 4. HLD 1. Cont statin per home regimen 5. CAD 1. Cont home meds 6. DVT prophylaixs 1. Cont therapeutic anticoagulation 7. URI 1. Most likely viral at this point 2. Supportive care alone. Hold off on antibiotics  Code Status: Full  Family Communication: Pt in room  Disposition Plan: Pending   Jerald Kief [facility] Pager [phone]  If 7PM-7AM, please contact night-coverage [email or web address] Password [facility] 01/12/2015, 2:53 PM

## 2015-01-12 NOTE — ED Provider Notes (Signed)
CSN: [id number]     Arrival date & time 01/12/15  1145 History   First MD Initiated Contact with Patient 01/12/15 1238     Chief Complaint  Patient presents with  . Shortness of Breath     (Consider location/radiation/quality/duration/timing/severity/associated sxs/prior Treatment) Patient is a 64 y.o. male presenting with shortness of breath. The history is provided by the patient.  Shortness of Breath Associated symptoms: no abdominal pain, no chest pain, no headaches, no rash, no vomiting and no wheezing    patient with shortness of breath for the 3 days. Has had some nasal congestion. History of atrial fibrillation and CHF. States he's been compliant with his medications. Since he has some swelling in his legs which is not unusual for him. No chest pain. No fevers. States he's had a little bit of a cough. States he's been more fatigued. Chest pain is worse with laying down. Worse with exertion.  Past Medical History  Diagnosis Date  . Hyperlipidemia   . Hypertensive heart disease   . Obesity (BMI 30-39.9)   . GERD (gastroesophageal reflux disease)   . Kidney stones   . Osteoarthritis   . A-fib   . CHF (congestive heart failure)    Past Surgical History  Procedure Laterality Date  . Cataract extraction     Family History  Problem Relation Age of Onset  . Diabetes Mother   . Heart disease Father   . Stroke Brother   . Heart disease Brother   . Diabetes Brother    History  Substance Use Topics  . Smoking status: Former Games developer  . Smokeless tobacco: Never Used  . Alcohol Use: No    Review of Systems  Constitutional: Negative for activity change and appetite change.  HENT: Positive for congestion.   Eyes: Negative for pain.  Respiratory: Positive for shortness of breath. Negative for chest tightness and wheezing.   Cardiovascular: Positive for leg swelling. Negative for chest pain and palpitations.  Gastrointestinal: Negative for nausea, vomiting, abdominal pain and  diarrhea.  Genitourinary: Negative for flank pain.  Musculoskeletal: Negative for back pain and neck stiffness.  Skin: Negative for rash.  Neurological: Negative for weakness, numbness and headaches.  Psychiatric/Behavioral: Negative for behavioral problems.      Allergies  Penicillins  Home Medications   Prior to Admission medications   Medication Sig Start Date End Date Taking? Authorizing Provider  aspirin 81 MG EC tablet Take 1 tablet (81 mg total) by mouth daily. Swallow whole. 09/03/12  Yes [name], MD  carvedilol (COREG) 6.25 MG tablet Take 1 tablet (6.25 mg total) by mouth 2 (two) times daily with a meal. 12/22/14  Yes Brittainy Sherlynn Carbon, PA-C  cholecalciferol (VITAMIN D) 1000 UNITS tablet Take 1,000 Units by mouth daily.   Yes Historical Provider, MD  FLUVIRIN SUSP Inject 0.5 mLs as directed once. 11/03/14  Yes Historical Provider, MD  furosemide (LASIX) 40 MG tablet Take 1 tablet (40 mg total) by mouth 2 (two) times daily. 12/22/14  Yes Brittainy Sherlynn Carbon, PA-C  lisinopril (PRINIVIL,ZESTRIL) 10 MG tablet Take 1 tablet (10 mg total) by mouth daily. 12/22/14  Yes Brittainy Sherlynn Carbon, PA-C  nitroGLYCERIN (NITROSTAT) 0.4 MG SL tablet Place 1 tablet (0.4 mg total) under the tongue every 5 (five) minutes as needed for chest pain. 12/22/14  Yes Brittainy Sherlynn Carbon, PA-C  potassium chloride SA (K-DUR,KLOR-CON) 20 MEQ tablet Take 2 tablets (40 mEq total) by mouth daily. 12/22/14  Yes Brittainy Sherlynn Carbon, PA-C  pravastatin (PRAVACHOL) 40 MG tablet Take 1 tablet (40 mg total) by mouth daily. 12/22/14  Yes Brittainy Sherlynn Carbon, PA-C  rivaroxaban (XARELTO) 20 MG TABS tablet Take 1 tablet (20 mg total) by mouth daily with supper. 12/22/14  Yes Brittainy M Simmons, PA-C   BP 103/68 mmHg  Pulse 59  Temp(Src) 98 F (36.7 C)  Resp 23  SpO2 94% Physical Exam  Constitutional: He appears well-developed and well-nourished.  HENT:  Head: Normocephalic and atraumatic.  Eyes: EOM are  normal. Pupils are equal, round, and reactive to light.  Neck: Normal range of motion. Neck supple. No JVD present.  Cardiovascular: Normal rate, regular rhythm and normal heart sounds.   No murmur heard. Pulmonary/Chest: Effort normal.  Rales to bilateral bases. Pitting edema to bilateral lower extremities. Moderate in severity.  Abdominal: Soft. Bowel sounds are normal. He exhibits no distension and no mass. There is no tenderness. There is no rebound and no guarding.  Musculoskeletal: Normal range of motion. He exhibits no edema.  Neurological: He is alert. No cranial nerve deficit.  Skin: Skin is warm.  Psychiatric: He has a normal mood and affect.  Nursing note and vitals reviewed.   ED Course  Procedures (including critical care time) Labs Review Labs Reviewed  BASIC METABOLIC PANEL - Abnormal; Notable for the following:    Glucose, Bld 127 (*)    Creatinine, Ser 1.38 (*)    GFR calc non Af Amer 53 (*)    GFR calc Af Amer 61 (*)    All other components within normal limits  BRAIN NATRIURETIC PEPTIDE - Abnormal; Notable for the following:    B Natriuretic Peptide 503.3 (*)    All other components within normal limits  CBC WITH DIFFERENTIAL/PLATELET  TROPONIN I  TROPONIN I  Rosezena Sensor, ED    Imaging Review Dg Chest 2 View  01/12/2015   CLINICAL DATA:  Shortness of breath  EXAM: CHEST  2 VIEW  COMPARISON:  12/22/2014  FINDINGS: Chronic cardiomegaly and vascular pedicle widening. There is diffuse interstitial opacity with small bilateral pleural effusions, similar to prior. Mild atelectasis is suspected at the bases.  IMPRESSION: Mild CHF.   Electronically Signed   By: Tiburcio Pea M.D.   On: 01/12/2015 13:11     EKG Interpretation   Date/Time:  Monday January 12 2015 11:55:50 EST Ventricular Rate:  147 PR Interval:    QRS Duration: 78 QT Interval:  294 QTC Calculation: 460 R Axis:   -49 Text Interpretation:  afib with RVR Left axis deviation Nonspecific ST   abnormality Abnormal ECG Confirmed by Jameria Bradway  MD, Harrold Donath [phone]) on  01/12/2015 12:39:14 PM      MDM   Final diagnoses:  Atrial fibrillation with RVR  Acute systolic heart failure    Patient with shortness of breath. Found to be in A. fib with RVR. Cardizem drip started and patient had slowing of the right. We were able to wean off the Cardizem drip. Does appear to be in a CHF also. No pneumonia. Will admit to internal medicine.  CRITICAL CARE Performed by: Billee Cashing Total critical care time:30 Critical care time was exclusive of separately billable procedures and treating other patients. Critical care was necessary to treat or prevent imminent or life-threatening deterioration. Critical care was time spent personally by me on the following activities: development of treatment plan with patient and/or surrogate as well as nursing, discussions with consultants, evaluation of patient's response to treatment, examination of patient, obtaining history from  patient or surrogate, ordering and performing treatments and interventions, ordering and review of laboratory studies, ordering and review of radiographic studies, pulse oximetry and re-evaluation of patient's condition.      Juliet Rude. Rubin Payor, MD 01/12/15 [id number]

## 2015-01-12 NOTE — ED Notes (Signed)
Pts Hr went up to the 130-150s on the monitor. Pt placed back in another room and dose of Labetalol given. Pt denies any symptoms with the tachycardia.

## 2015-01-12 NOTE — ED Notes (Signed)
Report attempted 

## 2015-01-12 NOTE — ED Notes (Signed)
BP now 90s systolic after 1 dose of Labetalol. Page placed out to admitting MD.

## 2015-01-13 DIAGNOSIS — I509 Heart failure, unspecified: Secondary | ICD-10-CM

## 2015-01-13 DIAGNOSIS — I110 Hypertensive heart disease with heart failure: Secondary | ICD-10-CM

## 2015-01-13 LAB — COMPREHENSIVE METABOLIC PANEL
ALBUMIN: 3.3 g/dL — AB (ref 3.5–5.2)
ALT: 13 U/L (ref 0–53)
ANION GAP: 10 (ref 5–15)
AST: 14 U/L (ref 0–37)
Alkaline Phosphatase: 50 U/L (ref 39–117)
BILIRUBIN TOTAL: 0.6 mg/dL (ref 0.3–1.2)
BUN: 21 mg/dL (ref 6–23)
CHLORIDE: 107 mmol/L (ref 96–112)
CO2: 23 mmol/L (ref 19–32)
Calcium: 8.6 mg/dL (ref 8.4–10.5)
Creatinine, Ser: 1.25 mg/dL (ref 0.50–1.35)
GFR calc Af Amer: 69 mL/min — ABNORMAL LOW (ref >90)
GFR calc non Af Amer: 59 mL/min — ABNORMAL LOW (ref >90)
Glucose, Bld: 106 mg/dL — ABNORMAL HIGH (ref 70–99)
Potassium: 3.9 mmol/L (ref 3.5–5.1)
Sodium: 140 mmol/L (ref 135–145)
TOTAL PROTEIN: 5.9 g/dL — AB (ref 6.0–8.3)

## 2015-01-13 LAB — CBC
HCT: 38.3 % — ABNORMAL LOW (ref 39.0–52.0)
HEMOGLOBIN: 12.5 g/dL — AB (ref 13.0–17.0)
MCH: 29.6 pg (ref 26.0–34.0)
MCHC: 32.6 g/dL (ref 30.0–36.0)
MCV: 90.8 fL (ref 78.0–100.0)
PLATELETS: 204 10*3/uL (ref 150–400)
RBC: 4.22 MIL/uL (ref 4.22–5.81)
RDW: 14.2 % (ref 11.5–15.5)
WBC: 8.6 10*3/uL (ref 4.0–10.5)

## 2015-01-13 LAB — TROPONIN I
Troponin I: 0.04 ng/mL — ABNORMAL HIGH (ref <0.031)
Troponin I: <0.03 ng/mL (ref <0.031)

## 2015-01-13 LAB — MRSA PCR SCREENING: MRSA by PCR: NEGATIVE

## 2015-01-13 MED ORDER — PERFLUTREN LIPID MICROSPHERE
1.0000 mL | INTRAVENOUS | Status: AC | PRN
  Administered 2015-01-13: 2 mL via INTRAVENOUS
  Filled 2015-01-13: qty 10

## 2015-01-13 MED ORDER — ZOLPIDEM TARTRATE 5 MG PO TABS
5.0000 mg | ORAL_TABLET | Freq: Once | ORAL | Status: AC
  Administered 2015-01-13: 5 mg via ORAL
  Filled 2015-01-13: qty 1

## 2015-01-13 MED ORDER — OFF THE BEAT BOOK
Freq: Once | Status: AC
  Administered 2015-01-13: 19:00:00
  Filled 2015-01-13 (×2): qty 1

## 2015-01-13 NOTE — Progress Notes (Signed)
[facility] PROGRESS NOTE  Charles Mckinney CWC:[id number] DOB: 10/17/1951 DOA: 01/12/2015 PCP: Default, Provider, MD  Assessment/Plan: 1. Acutely decompensated systolic CHF 1. Likely secondary RVR 2. Lasix was given in ED with some improvement 3. Pt is continued on IV lasix at 40mg  daily 4. Monitor i/o and daily weights 5. Wt on 3/15 of 138.3kg 6. Thus far, net neg 1.8L 7. Wt on admit 151.4 8. Wt today 151.3kg 2. Afib RVR 1. Initially improved with cardizem gtt in ed, since weaned off 2. Increased home coreg to 12.5mg  bid 3. Pt hypotensive overnight and transferred to SDU 4. Will d/c lisinopril to allow for additional beta blockade 5. Cont on PRN labetalol 6. Continue xarelto 3. Obesity 1. Pt actively trying to lose weight 2. Stable 4. HLD 1. Cont statin per home regimen 5. CAD 1. Cont home meds 6. DVT prophylaixs 1. Cont therapeutic anticoagulation 2. No signs of active bleed 7. URI 1. Most likely viral at this point 2. Supportive care alone. Hold off on antibiotics  Code Status: Full Family Communication: Pt in room Disposition Plan: Pending   Consultants:    Procedures:    Antibiotics:  none (indicate start date, and stop date if known)  HPI/Subjective: Feels somewhat better today. BP noted to be low overnight and pt transferred to SDU for closer monitoring  Objective: Filed Vitals:   01/13/15 0818 01/13/15 0915 01/13/15 1141 01/13/15 1536  BP: 105/62  105/70 111/87  Pulse: 97  69 41  Temp:   97.5 F (36.4 C)   TempSrc:   Oral   Resp:    26  Height:      Weight:   151.3 kg (333 lb 8.9 oz)   SpO2:  94% 93% 98%    Intake/Output Summary (Last 24 hours) at 01/13/15 1717 Last data filed at 01/13/15 1415  Gross per 24 hour  Intake    860 ml  Output   1900 ml  Net  -1040 ml   Filed Weights   01/12/15 2314 01/13/15 1141  Weight: 151.4 kg (333 lb 12.4 oz) 151.3 kg (333 lb 8.9 oz)    Exam:   General:  Awake, in nad  Cardiovascular:   Irregularly irregular, s1, s2  Respiratory: normal resp effort, no wheezing  Abdomen: soft,nondistended  Musculoskeletal: perfused, no clubbing   Data Reviewed: Basic Metabolic Panel:  Recent Labs Lab 01/12/15 1204 01/13/15 0120  NA 142 140  K 3.8 3.9  CL 107 107  CO2 24 23  GLUCOSE 127* 106*  BUN 19 21  CREATININE 1.38* 1.25  CALCIUM 9.1 8.6   Liver Function Tests:  Recent Labs Lab 01/13/15 0120  AST 14  ALT 13  ALKPHOS 50  BILITOT 0.6  PROT 5.9*  ALBUMIN 3.3*   No results for input(s): LIPASE, AMYLASE in the last 168 hours. No results for input(s): AMMONIA in the last 168 hours. CBC:  Recent Labs Lab 01/12/15 1204 01/13/15 0120  WBC 8.5 8.6  NEUTROABS 6.4  --   HGB 13.4 12.5*  HCT 41.8 38.3*  MCV 91.9 90.8  PLT 219 204   Cardiac Enzymes:  Recent Labs Lab 01/12/15 1900 01/13/15 0120 01/13/15 0700  TROPONINI 0.03 0.04* <0.03   BNP (last 3 results)  Recent Labs  01/12/15 1204  BNP 503.3*    ProBNP (last 3 results)  Recent Labs  01/31/14 1726  PROBNP 2718.0*    CBG: No results for input(s): GLUCAP in the last 168 hours.  Recent Results (from the  past 240 hour(s))  MRSA PCR Screening     Status: None   Collection Time: 01/12/15 11:11 PM  Result Value Ref Range Status   MRSA by PCR NEGATIVE NEGATIVE Final    Comment:        The GeneXpert MRSA Assay (FDA approved for NASAL specimens only), is one component of a comprehensive MRSA colonization surveillance program. It is not intended to diagnose MRSA infection nor to guide or monitor treatment for MRSA infections.      Studies: Dg Chest 2 View  01/12/2015   CLINICAL DATA:  Shortness of breath  EXAM: CHEST  2 VIEW  COMPARISON:  12/22/2014  FINDINGS: Chronic cardiomegaly and vascular pedicle widening. There is diffuse interstitial opacity with small bilateral pleural effusions, similar to prior. Mild atelectasis is suspected at the bases.  IMPRESSION: Mild CHF.   Electronically  Signed   By: Tiburcio Pea M.D.   On: 01/12/2015 13:11    Scheduled Meds: . aspirin  81 mg Oral Daily  . carvedilol  12.5 mg Oral BID WC  . furosemide  40 mg Intravenous Daily  . off the beat book   Does not apply Once  . potassium chloride SA  40 mEq Oral Daily  . pravastatin  40 mg Oral Q2200  . rivaroxaban  20 mg Oral Q supper  . sodium chloride  3 mL Intravenous Q12H   Continuous Infusions: . diltiazem (CARDIZEM) infusion 5 mg/hr (01/13/15 1303)    Active Problems:   Hyperlipidemia   Morbid obesity   Hypertensive heart disease   GERD   A-fib   Atrial fibrillation with RVR   Atrial fibrillation, rapid   Atrial fibrillation   Fed Ceci K  [facility] Pager [phone]. If 7PM-7AM, please contact night-coverage at [email or web address], password [facility] 01/13/2015, 5:17 PM  LOS: 1 day

## 2015-01-13 NOTE — Progress Notes (Signed)
Utilization Review Completed.  

## 2015-01-13 NOTE — Progress Notes (Signed)
  Echocardiogram 2D Echocardiogram with Definity has been performed.  Dann Galicia 01/13/2015, 3:08 PM

## 2015-01-14 LAB — BASIC METABOLIC PANEL
ANION GAP: 10 (ref 5–15)
BUN: 21 mg/dL (ref 6–23)
CALCIUM: 9.2 mg/dL (ref 8.4–10.5)
CO2: 24 mmol/L (ref 19–32)
Chloride: 106 mmol/L (ref 96–112)
Creatinine, Ser: 1.29 mg/dL (ref 0.50–1.35)
GFR calc Af Amer: 66 mL/min — ABNORMAL LOW (ref >90)
GFR calc non Af Amer: 57 mL/min — ABNORMAL LOW (ref >90)
GLUCOSE: 122 mg/dL — AB (ref 70–99)
Potassium: 4.2 mmol/L (ref 3.5–5.1)
SODIUM: 140 mmol/L (ref 135–145)

## 2015-01-14 LAB — TSH: TSH: 3.727 u[IU]/mL (ref 0.350–4.500)

## 2015-01-14 MED ORDER — CARVEDILOL 6.25 MG PO TABS
6.2500 mg | ORAL_TABLET | Freq: Two times a day (BID) | ORAL | Status: DC
  Filled 2015-01-14 (×2): qty 1

## 2015-01-14 MED ORDER — LISINOPRIL 2.5 MG PO TABS
2.5000 mg | ORAL_TABLET | Freq: Every day | ORAL | Status: DC
  Administered 2015-01-15: 2.5 mg via ORAL
  Filled 2015-01-14 (×2): qty 1

## 2015-01-14 MED ORDER — CARVEDILOL 12.5 MG PO TABS
12.5000 mg | ORAL_TABLET | Freq: Two times a day (BID) | ORAL | Status: DC
  Administered 2015-01-14 – 2015-01-20 (×11): 12.5 mg via ORAL
  Filled 2015-01-14 (×15): qty 1

## 2015-01-14 MED ORDER — AMIODARONE HCL 200 MG PO TABS
400.0000 mg | ORAL_TABLET | Freq: Every day | ORAL | Status: DC
  Administered 2015-01-14: 400 mg via ORAL
  Filled 2015-01-14 (×2): qty 2

## 2015-01-14 MED ORDER — LISINOPRIL 2.5 MG PO TABS: 2.5000 mg | ORAL_TABLET | Freq: Every day | ORAL | Status: DC

## 2015-01-14 MED ORDER — ZOLPIDEM TARTRATE 5 MG PO TABS
5.0000 mg | ORAL_TABLET | Freq: Once | ORAL | Status: AC
  Administered 2015-01-15: 5 mg via ORAL
  Filled 2015-01-14 (×2): qty 1

## 2015-01-14 NOTE — Consult Note (Signed)
Cardiology Consult Note  Admit date: 01/12/2015 Name: Charles Mckinney 64 y.o.  male DOB:  Mar 19, 1951 MRN:  [id number]  Today's date:  01/14/2015  Referring Physician:    [facility]  Primary Physician:    [facility]  Reason for Consultation:    Congestive heart failure  IMPRESSIONS: 1.  Acute on chronic systolic congestive heart failure worsened likely by the development of atrial fibrillation with rapid response, some medical noncompliance, and recent viral infection 2.  Atrial fibrillation with rapid ventricular response 3.  Anticoagulation with  XARELTO 4.  Morbid obesity 5.  Medical noncompliance 6.  Cardiomyopathy-type still undetermined.  RECOMMENDATION: 1.  He appears to have been on XARELTO for the past 3 weeks.  I would go ahead and begin amiodarone and see if he'll convert back to sinus rhythm.  This also will help with heart failure if we can restore him to the rhythm. 2.  He is not interested in having much of a workup here at this time and would prefer to have his workup done at the [place]. 3.  Continue intravenous diuresis while he is here.  HISTORY: This 64 year old male was seen by me about a year ago when he was admitted with atrial fibrillation and acute systolic heart failure.  At the time his ejection fraction was 35%.  He was diuresed in the hospital and was discharged but never followed up with me.  He was seen at the [facility] for a while and evidently quit taking XARELTO.  He was last seen at the [place] in the summer.  He presented to the emergency room in January where he was seen by Dr. Eden Emms but refused admission to the hospital.  At the time he said that he started taking XARELTO back.  He was having a runny nose and also significant orthopnea and difficulty breathing at night.  He returned to the emergency room yesterday with worsening dyspnea on exertion, PND orthopnea and was found to be in rapid atrial fibrillation and admitted to the hospital.  Cardiology was  asked to assess him today.  A year ago we tried to get him to have a more extensive workup and were little unwilling to discharge him on amiodarone because of concern that he would not follow up.  Past Medical History  Diagnosis Date  . Hyperlipidemia   . Hypertensive heart disease   . Obesity (BMI 30-39.9)   . GERD (gastroesophageal reflux disease)   . Kidney stones   . Osteoarthritis   . A-fib   . CHF (congestive heart failure)       Past Surgical History  Procedure Laterality Date  . Cataract extraction       Allergies:  is allergic to penicillins.   Medications: Prior to Admission medications   Medication Sig Start Date End Date Taking? Authorizing Provider  aspirin 81 MG EC tablet Take 1 tablet (81 mg total) by mouth daily. Swallow whole. 09/03/12  Yes [name], MD  carvedilol (COREG) 6.25 MG tablet Take 1 tablet (6.25 mg total) by mouth 2 (two) times daily with a meal. 12/22/14  Yes Brittainy Sherlynn Carbon, PA-C  cholecalciferol (VITAMIN D) 1000 UNITS tablet Take 1,000 Units by mouth daily.   Yes Historical Provider, MD  FLUVIRIN SUSP Inject 0.5 mLs as directed once. 11/03/14  Yes Historical Provider, MD  furosemide (LASIX) 40 MG tablet Take 1 tablet (40 mg total) by mouth 2 (two) times daily. 12/22/14  Yes Brittainy Sherlynn Carbon, PA-C  lisinopril (  PRINIVIL,ZESTRIL) 10 MG tablet Take 1 tablet (10 mg total) by mouth daily. 12/22/14  Yes Brittainy Sherlynn Carbon, PA-C  nitroGLYCERIN (NITROSTAT) 0.4 MG SL tablet Place 1 tablet (0.4 mg total) under the tongue every 5 (five) minutes as needed for chest pain. 12/22/14  Yes Brittainy Sherlynn Carbon, PA-C  potassium chloride SA (K-DUR,KLOR-CON) 20 MEQ tablet Take 2 tablets (40 mEq total) by mouth daily. 12/22/14  Yes Brittainy Sherlynn Carbon, PA-C  pravastatin (PRAVACHOL) 40 MG tablet Take 1 tablet (40 mg total) by mouth daily. 12/22/14  Yes Brittainy Sherlynn Carbon, PA-C  rivaroxaban (XARELTO) 20 MG TABS tablet Take 1 tablet (20 mg total) by mouth daily  with supper. 12/22/14  Yes Brittainy Sherlynn Carbon, PA-C    Family History: Family Status  Relation Status Death Age  . Mother Deceased 65    complications of surgery  . Father Deceased 40    died of MI  . Sister Alive     CHF  . Brother Alive     CABG  . Brother Alive     history of CVA, lives in [place]    Social History:   reports that he has quit smoking. He has never used smokeless tobacco. He reports that he does not drink alcohol or use illicit drugs.   History   Social History Narrative    Review of Systems: Recent upper respiratory infection, other than as noted above remainder of the review of systems unremarkable.  Physical Exam: BP 113/73 mmHg  Pulse 79  Temp(Src) 97.7 F (36.5 C) (Oral)  Resp 25  Ht 5\' 10"  (1.778 m)  Wt 151.3 kg (333 lb 8.9 oz)  BMI 47.86 kg/m2  SpO2 93%  General appearance: Extremely obese white male in no acute distress Head: Normocephalic, without obvious abnormality, atraumatic Neck: no adenopathy, no carotid bruit, supple, symmetrical, trachea midline and JVD difficult to assess due to obesity Lungs: Reduced breath sounds at the bases Heart: Rapid irregular rhythm, possible soft S3 Abdomen: Extremely large soft no gross masses Rectal: deferred Extremities: 2+ edema Pulses: Femoral pulses deep, peripheral pulses palpable Skin: Skin color, texture, turgor normal. No rashes or lesions Neurologic: Grossly normal  Labs: CBC  Recent Labs  01/12/15 1204 01/13/15 0120  WBC 8.5 8.6  RBC 4.55 4.22  HGB 13.4 12.5*  HCT 41.8 38.3*  PLT 219 204  MCV 91.9 90.8  MCH 29.5 29.6  MCHC 32.1 32.6  RDW 14.1 14.2  LYMPHSABS 1.4  --   MONOABS 0.6  --   EOSABS 0.1  --   BASOSABS 0.0  --    CMP   Recent Labs  01/13/15 0120 01/14/15 0344  NA 140 140  K 3.9 4.2  CL 107 106  CO2 23 24  GLUCOSE 106* 122*  BUN 21 21  CREATININE 1.25 1.29  CALCIUM 8.6 9.2  PROT 5.9*  --   ALBUMIN 3.3*  --   AST 14  --   ALT 13  --   ALKPHOS 50  --    BILITOT 0.6  --   GFRNONAA 59* 57*  GFRAA 69* 66*   BNP (last 3 results)  Recent Labs  01/31/14 1726  PROBNP 2718.0*   Cardiac Panel (last 3 results)  Recent Labs  01/12/15 1900 01/13/15 0120 01/13/15 0700  TROPONINI 0.03 0.04* <0.03     Radiology: Cardiomegaly, interstitial congestion  EKG: Atrial fibrillation with rapid ventricular response, right bundle branch block, possible old anteroseptal infarction  Signed:  W. Viann Fish, Montez Hageman.  MD [facility]   Cardiology Consultant  01/14/2015, 1:39 PM

## 2015-01-14 NOTE — Progress Notes (Signed)
Report recd from [place], [place] in [facility].    Will monitor for pt's arrival to unit   Andrew Au I 01/14/2015 3:44 PM

## 2015-01-14 NOTE — Discharge Instructions (Addendum)
Follow with Primary MD  in 7 days   Get CBC, CMP, 2 view Chest X ray checked  by Primary MD next visit.    Activity: As tolerated with Full fall precautions use walker/cane & assistance as needed   Disposition Home     Diet: Heart Healthy   Check your Weight same time everyday, if you gain over 2 pounds, or you develop in leg swelling, experience more shortness of breath or chest pain, call your Primary MD immediately. Follow Cardiac Low Salt Diet and 1.5 lit/day fluid restriction.   On your next visit with your primary care physician please Get Medicines reviewed and adjusted.   Please request your Prim.MD to go over all Hospital Tests and Procedure/Radiological results at the follow up, please get all Hospital records sent to your Prim MD by signing hospital release before you go home.   If you experience worsening of your admission symptoms, develop shortness of breath, life threatening emergency, suicidal or homicidal thoughts you must seek medical attention immediately by calling 911 or calling your MD immediately  if symptoms less severe.  You Must read complete instructions/literature along with all the possible adverse reactions/side effects for all the Medicines you take and that have been prescribed to you. Take any new Medicines after you have completely understood and accpet all the possible adverse reactions/side effects.   Do not drive, operating heavy machinery, perform activities at heights, swimming or participation in water activities or provide baby sitting services if your were admitted for syncope or siezures until you have seen by Primary MD or a Neurologist and advised to do so again.  Do not drive when taking Pain medications.    Do not take more than prescribed Pain, Sleep and Anxiety Medications  Special Instructions: If you have smoked or chewed Tobacco  in the last 2 yrs please stop smoking, stop any regular Alcohol  and or any Recreational drug use.  Wear  Seat belts while driving.   Please note  You were cared for by a hospitalist during your hospital stay. If you have any questions about your discharge medications or the care you received while you were in the hospital after you are discharged, you can call the unit and asked to speak with the hospitalist on call if the hospitalist that took care of you is not available. Once you are discharged, your primary care physician will handle any further medical issues. Please note that NO REFILLS for any discharge medications will be authorized once you are discharged, as it is imperative that you return to your primary care physician (or establish a relationship with a primary care physician if you do not have one) for your aftercare needs so that they can reassess your need for medications and monitor your lab values.   Information on my medicine - XARELTO (Rivaroxaban)  Why was Xarelto prescribed for you? Xarelto was prescribed for you to reduce the risk of a blood clot forming that can cause a stroke if you have a medical condition called atrial fibrillation (a type of irregular heartbeat).  What do you need to know about xarelto ? Take your Xarelto ONCE DAILY at the same time every day with your evening meal. If you have difficulty swallowing the tablet whole, you may crush it and mix in applesauce just prior to taking your dose.  Take Xarelto exactly as prescribed by your doctor and DO NOT stop taking Xarelto without talking to the doctor who prescribed the medication.  Stopping without other stroke prevention medication to take the place of Xarelto may increase your risk of developing a clot that causes a stroke.  Refill your prescription before you run out.  After discharge, you should have regular check-up appointments with your healthcare provider that is prescribing your Xarelto.  In the future your dose may need to be changed if your kidney function or weight changes by a significant  amount.  What do you do if you miss a dose? If you are taking Xarelto ONCE DAILY and you miss a dose, take it as soon as you remember on the same day then continue your regularly scheduled once daily regimen the next day. Do not take two doses of Xarelto at the same time or on the same day.   Important Safety Information A possible side effect of Xarelto is bleeding. You should call your healthcare provider right away if you experience any of the following: ? Bleeding from an injury or your nose that does not stop. ? Unusual colored urine (red or dark brown) or unusual colored stools (red or black). ? Unusual bruising for unknown reasons. ? A serious fall or if you hit your head (even if there is no bleeding).  Some medicines may interact with Xarelto and might increase your risk of bleeding while on Xarelto. To help avoid this, consult your healthcare provider or pharmacist prior to using any new prescription or non-prescription medications, including herbals, vitamins, non-steroidal anti-inflammatory drugs (NSAIDs) and supplements.  This website has more information on Xarelto: VisitDestination.com.br.

## 2015-01-14 NOTE — Progress Notes (Addendum)
Patient Demographics  Charles Mckinney, is a 64 y.o. male, DOB - 1951-10-16, MRA:[id number]  Admit date - 01/12/2015   Admitting Physician Jerald Kief, MD  Outpatient Primary MD for the patient is Default, Provider, MD  LOS - 2   Chief Complaint  Patient presents with  . Shortness of Breath        Subjective:   Charles Mckinney today has, No headache, No chest pain, No abdominal pain - No Nausea, No new weakness tingling or numbness, No Cough -  Improved SOB.    Assessment & Plan    1. 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 Improving with diuresis, continue Lasix and fluid restriction. Cardiology consulted. We will add low-dose ACE inhibitor.  Filed Weights   01/12/15 2314 01/13/15 1141  Weight: 151.4 kg (333 lb 12.4 oz) 151.3 kg (333 lb 8.9 oz)     2. A. fib with RVR. [place] Vasc 2 score - 2. Since EF is under 35% and blood pressure soft I will try to get him off Cardizem, I have requested cardiology to evaluate, he might require amiodarone versus digoxin. Already on Coreg monitor. Continue xaralto. Check TSH.   3. Essential hypertension. On beta blocker, add low-dose ACE inhibitor.   4. Dyslipidemia. On home dose statin.   5. CAD. Chest pain-free. On aspirin and Coreg and statin for secondary prevention.    Code Status: Full  Family Communication: none present  Disposition Plan: Home   Procedures   TTE  - Left ventricle: Dificult study. With contrast, it appears that the EF is in the 35% range. The cavity size was mildly dilated. Wall thickness was increased in a pattern of mild LVH. Theestimated ejection fraction was 35%. Diffuse hypokinesis. - Left atrium: The atrium was  moderately dilated. - Right ventricle: The cavity size was mildly dilated. Systolicfunction was mildly reduced.   Consults Cards   Medications  Scheduled Meds: . aspirin  81 mg Oral Daily  . carvedilol  12.5 mg Oral BID WC  . furosemide  40 mg Intravenous Daily  . potassium chloride SA  40 mEq Oral Daily  . pravastatin  40 mg Oral Q2200  . rivaroxaban  20 mg Oral Q supper  . sodium chloride  3 mL Intravenous Q12H   Continuous Infusions: . diltiazem (CARDIZEM) infusion 10 mg/hr (01/14/15 1026)   PRN Meds:.acetaminophen **OR** acetaminophen, labetalol, morphine injection, nitroGLYCERIN  DVT Prophylaxis  Xaralto  Lab Results  Component Value Date   PLT 204 01/13/2015    Antibiotics     Anti-infectives    None          Objective:   Filed Vitals:   01/13/15 2026 01/14/15 0005 01/14/15 0432 01/14/15 0818  BP: 113/72 124/95 99/61 109/84  Pulse: 93 93 101 126  Temp:  98.4 F (36.9 C) 98.2 F (36.8 C) 97.5 F (36.4 C)  TempSrc:  Oral Oral Oral  Resp:  23 23 19   Height:      Weight:      SpO2: 93% 94% 94% 97%    Wt Readings from Last 3 Encounters:  01/13/15 151.3 kg (333 lb 8.9 oz)  12/22/14 148.326 kg (  327 lb)  02/03/14 138.3 kg (304 lb 14.3 oz)     Intake/Output Summary (Last 24 hours) at 01/14/15 1037 Last data filed at 01/14/15 0900  Gross per 24 hour  Intake    115 ml  Output   1475 ml  Net  -1360 ml     Physical Exam  Awake Alert, Oriented X 3, No new F.N deficits, Normal affect [place].AT,PERRAL Supple Neck,No JVD, No cervical lymphadenopathy appriciated.  Symmetrical Chest wall movement, Good air movement bilaterally, few rales RRR,No Gallops,Rubs or new Murmurs, No Parasternal Heave +ve B.Sounds, Abd Soft, No tenderness, No organomegaly appriciated, No rebound - guarding or rigidity. No Cyanosis, Clubbing , 1+ edema, No new Rash or bruise     Data Review   Micro Results Recent Results (from the past 240 hour(s))  MRSA PCR Screening      Status: None   Collection Time: 01/12/15 11:11 PM  Result Value Ref Range Status   MRSA by PCR NEGATIVE NEGATIVE Final    Comment:        The GeneXpert MRSA Assay (FDA approved for NASAL specimens only), is one component of a comprehensive MRSA colonization surveillance program. It is not intended to diagnose MRSA infection nor to guide or monitor treatment for MRSA infections.     Radiology Reports Dg Chest 2 View  01/12/2015   CLINICAL DATA:  Shortness of breath  EXAM: CHEST  2 VIEW  COMPARISON:  12/22/2014  FINDINGS: Chronic cardiomegaly and vascular pedicle widening. There is diffuse interstitial opacity with small bilateral pleural effusions, similar to prior. Mild atelectasis is suspected at the bases.  IMPRESSION: Mild CHF.   Electronically Signed   By: Tiburcio Pea M.D.   On: 01/12/2015 13:11   Dg Chest 2 View  12/22/2014   CLINICAL DATA:  Shortness of breath.  Chest pain.  EXAM: CHEST  2 VIEW  COMPARISON:  None.  FINDINGS: Mediastinum and hilar structures normal. Cardiomegaly with pulmonary vascular prominence and bilateral interstitial prominence. Small pleural effusion. No pneumothorax. Degenerative changes thoracic spine.  IMPRESSION: Congestive heart failure with mild interstitial edema.   Electronically Signed   By: Maisie Fus  Register   On: 12/22/2014 14:07     CBC  Recent Labs Lab 01/12/15 1204 01/13/15 0120  WBC 8.5 8.6  HGB 13.4 12.5*  HCT 41.8 38.3*  PLT 219 204  MCV 91.9 90.8  MCH 29.5 29.6  MCHC 32.1 32.6  RDW 14.1 14.2  LYMPHSABS 1.4  --   MONOABS 0.6  --   EOSABS 0.1  --   BASOSABS 0.0  --     Chemistries   Recent Labs Lab 01/12/15 1204 01/13/15 0120 01/14/15 0344  NA 142 140 140  K 3.8 3.9 4.2  CL 107 107 106  CO2 GLUCOSE 127* 106* 122*  BUN CREATININE 1.38* 1.25 1.29  CALCIUM 9.1 8.6 9.2  AST  --  14  --   ALT  --  13  --   ALKPHOS  --  50  --   BILITOT  --  0.6  --     ------------------------------------------------------------------------------------------------------------------ estimated creatinine clearance is 85.3 mL/min (by C-G formula based on Cr of 1.29). ------------------------------------------------------------------------------------------------------------------ No results for input(s): HGBA1C in the last 72 hours. ------------------------------------------------------------------------------------------------------------------ No results for input(s): CHOL, HDL, LDLCALC, TRIG, CHOLHDL, LDLDIRECT in the last 72 hours. ------------------------------------------------------------------------------------------------------------------ No results for input(s): TSH, T4TOTAL, T3FREE, THYROIDAB in the last 72 hours.  Invalid input(s): FREET3 ------------------------------------------------------------------------------------------------------------------ No  results for input(s): VITAMINB12, FOLATE, FERRITIN, TIBC, IRON, RETICCTPCT in the last 72 hours.  Coagulation profile No results for input(s): INR, PROTIME in the last 168 hours.  No results for input(s): DDIMER in the last 72 hours.  Cardiac Enzymes  Recent Labs Lab 01/12/15 1900 01/13/15 0120 01/13/15 0700  TROPONINI 0.03 0.04* <0.03   ------------------------------------------------------------------------------------------------------------------ Invalid input(s): POCBNP     Time Spent in minutes   35   Caralynn Gelber K M.D on 01/14/2015 at 10:37 AM  Between 7am to 7pm - Pager - [phone]  After 7pm go to [email or web address] - password [facility]  [facility] Office  [phone]

## 2015-01-15 MED ORDER — AMIODARONE HCL 200 MG PO TABS
400.0000 mg | ORAL_TABLET | Freq: Two times a day (BID) | ORAL | Status: DC
  Administered 2015-01-15 (×2): 400 mg via ORAL
  Filled 2015-01-15 (×4): qty 2

## 2015-01-15 MED ORDER — FUROSEMIDE 10 MG/ML IJ SOLN
40.0000 mg | Freq: Three times a day (TID) | INTRAMUSCULAR | Status: DC
  Administered 2015-01-15 – 2015-01-20 (×16): 40 mg via INTRAVENOUS
  Filled 2015-01-15 (×16): qty 4

## 2015-01-15 NOTE — Progress Notes (Signed)
MD notified of pt's BP.  Will continue to monitor. Charles Mckinney

## 2015-01-15 NOTE — Progress Notes (Signed)
Pt alert and oriented x4,  denies any needs at this time. Report received from staff RN Ladona Ridgel.

## 2015-01-15 NOTE — Progress Notes (Addendum)
Subjective:  He is still significantly dyspneic.  He spent last night sitting up in the chair at the bedside.  Heart rate response is a little better controlled today.  Objective:  Vital Signs in the last 24 hours: BP 104/61 mmHg  Pulse 68  Temp(Src) 97.4 F (36.3 C) (Oral)  Resp 20  Ht 5\' 10"  (1.778 m)  Wt 151.683 kg (334 lb 6.4 oz)  BMI 47.98 kg/m2  SpO2 96%  Physical Exam: Extremely large morbidly obese male in no acute distress Lungs: Reduced breath sounds at bases, minimal rales  Cardiac:  Irregular rhythm, normal S1 and S2, no S3 Abdomen:  Soft, nontender, no masses Extremities:  2+ edema present  Intake/Output from previous day: 02/10 0701 - 02/11 0700 In: 1147.2 [P.O.:1020; I.V.:127.2] Out: 1600 [Urine:1600] Weight Filed Weights   01/13/15 1141 01/14/15 1630 01/15/15 0428  Weight: 151.3 kg (333 lb 8.9 oz) 151.501 kg (334 lb) 151.683 kg (334 lb 6.4 oz)    Lab Results: Basic Metabolic Panel:  Recent Labs  28/76/81 0120 01/14/15 0344  NA 140 140  K 3.9 4.2  CL 107 106  CO2 23 24  GLUCOSE 106* 122*  BUN 21 21  CREATININE 1.25 1.29    CBC:  Recent Labs  01/12/15 1204 01/13/15 0120  WBC 8.5 8.6  NEUTROABS 6.4  --   HGB 13.4 12.5*  HCT 41.8 38.3*  MCV 91.9 90.8  PLT 219 204    BNP    Component Value Date/Time   BNP 503.3* 01/12/2015 1204    PROTIME: Lab Results  Component Value Date   INR 0.99 02/01/2014    Telemetry: Atrial fibrillation and flutter now with controlled response.  Occasional rapid episodes at night and when up and about  Assessment/Plan: 1. Acute on chronic systolic congestive heart failure worsened likely by the development of atrial fibrillation with rapid response, some medical noncompliance, and recent viral infection  He still is significantly volume overloaded and weight has not really changed appreciably.  When he was last in the hospital he diuresed about 17 pounds.  I think we need to increase his diuresis and  continue amiodarone loading  2. Atrial fibrillation with rapid ventricular response -his rate is better controlled at the present time and amiodarone was started yesterday.  Has been on XARELTO about 3 weeks now. 3. Anticoagulation with XARELTO 4. Morbid obesity 5. Medical noncompliance 6. Cardiomyopathy-type still undetermined.   Recommendations:  Increase IV diuresis.  Increase amiodarone to help with rate control.  Wean diltiazem as amiodarone takes effect.     Darden Palmer  MD [facility]  01/15/2015, 8:50 AM

## 2015-01-15 NOTE — Progress Notes (Signed)
Patient Demographics  Charles Mckinney, is a 64 y.o. male, DOB - [birth year], ZOX:[id number]  Admit date - 01/12/2015   Admitting Physician Jerald Kief, MD  Outpatient Primary MD for the patient is Default, Provider, MD  LOS - 3   Chief Complaint  Patient presents with  . Shortness of Breath        Subjective:   Charles Mckinney today has, No headache, No chest pain, No abdominal pain - No Nausea, No new weakness tingling or numbness, No Cough -  Improved SOB.    Assessment & Plan    1. 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 Improving with diuresis, continue Lasix , salt and fluid restriction. Cardiology consulted. Have added low-dose ACE inhibitor.  Filed Weights   01/13/15 1141 01/14/15 1630 01/15/15 0428  Weight: 151.3 kg (333 lb 8.9 oz) 151.501 kg (334 lb) 151.683 kg (334 lb 6.4 oz)     2. A. fib with RVR. [place] Vasc 2 score - 4. Since EF is under 35% have requested cardiology to consider stopping Cardizem and switching to amiodarone/digoxin. Already on Coreg monitor. Continue xaralto. Stable TSH. Rate improved.   3. Essential hypertension. On beta blocker, add low-dose ACE inhibitor.   4. Dyslipidemia. On home dose statin.   5. CAD. Chest pain-free. On aspirin and Coreg and statin for secondary prevention.    Code Status: Full  Family Communication: none present  Disposition Plan: Home   Procedures   TTE  - Left ventricle: Dificult study. With contrast, it appears that the EF is in the 35% range. The cavity size was mildly dilated. Wall thickness was increased in a pattern of mild LVH. Theestimated ejection fraction was 35%. Diffuse hypokinesis. - Left atrium: The atrium was  moderately dilated. - Right ventricle: The cavity size was mildly dilated. Systolicfunction was mildly reduced.   Consults Cards   Medications  Scheduled Meds: . amiodarone  400 mg Oral BID  . aspirin  81 mg Oral Daily  . carvedilol  12.5 mg Oral BID WC  . furosemide  40 mg Intravenous 3 times per day  . lisinopril  2.5 mg Oral Daily  . potassium chloride SA  40 mEq Oral Daily  . pravastatin  40 mg Oral Q2200  . rivaroxaban  20 mg Oral Q supper  . sodium chloride  3 mL Intravenous Q12H   Continuous Infusions: . diltiazem (CARDIZEM) infusion 5 mg/hr (01/15/15 0418)   PRN Meds:.acetaminophen **OR** acetaminophen, morphine injection, nitroGLYCERIN  DVT Prophylaxis  Xaralto  Lab Results  Component Value Date   PLT 204 01/13/2015    Antibiotics     Anti-infectives    None          Objective:   Filed Vitals:   01/15/15 0428 01/15/15 0430 01/15/15 0741 01/15/15 0911  BP:  109/82 104/61 104/60  Pulse:  103 68   Temp:  97.4 F (36.3 C)    TempSrc:  Oral    Resp:  20    Height:      Weight: 151.683 kg (334 lb 6.4 oz)     SpO2:  96%      Wt Readings from Last 3  Encounters:  01/15/15 151.683 kg (334 lb 6.4 oz)  12/22/14 148.326 kg (327 lb)  02/03/14 138.3 kg (304 lb 14.3 oz)     Intake/Output Summary (Last 24 hours) at 01/15/15 1035 Last data filed at 01/15/15 0843  Gross per 24 hour  Intake 1372.2 ml  Output   1475 ml  Net -102.8 ml     Physical Exam  Awake Alert, Oriented X 3, No new F.N deficits, Normal affect [place].AT,PERRAL Supple Neck, +veJVD, No cervical lymphadenopathy appriciated.  Symmetrical Chest wall movement, Good air movement bilaterally, few rales iRRR,No Gallops,Rubs or new Murmurs, No Parasternal Heave +ve B.Sounds, Abd Soft, No tenderness, No organomegaly appriciated, No rebound - guarding or rigidity. No Cyanosis, Clubbing , 1+ edema, No new Rash or bruise     Data Review   Micro Results Recent Results (from the past 240  hour(s))  MRSA PCR Screening     Status: None   Collection Time: 01/12/15 11:11 PM  Result Value Ref Range Status   MRSA by PCR NEGATIVE NEGATIVE Final    Comment:        The GeneXpert MRSA Assay (FDA approved for NASAL specimens only), is one component of a comprehensive MRSA colonization surveillance program. It is not intended to diagnose MRSA infection nor to guide or monitor treatment for MRSA infections.     Radiology Reports Dg Chest 2 View  01/12/2015   CLINICAL DATA:  Shortness of breath  EXAM: CHEST  2 VIEW  COMPARISON:  12/22/2014  FINDINGS: Chronic cardiomegaly and vascular pedicle widening. There is diffuse interstitial opacity with small bilateral pleural effusions, similar to prior. Mild atelectasis is suspected at the bases.  IMPRESSION: Mild CHF.   Electronically Signed   By: Tiburcio Pea M.D.   On: 01/12/2015 13:11   Dg Chest 2 View  12/22/2014   CLINICAL DATA:  Shortness of breath.  Chest pain.  EXAM: CHEST  2 VIEW  COMPARISON:  None.  FINDINGS: Mediastinum and hilar structures normal. Cardiomegaly with pulmonary vascular prominence and bilateral interstitial prominence. Small pleural effusion. No pneumothorax. Degenerative changes thoracic spine.  IMPRESSION: Congestive heart failure with mild interstitial edema.   Electronically Signed   By: Maisie Fus  Register   On: 12/22/2014 14:07     CBC  Recent Labs Lab 01/12/15 1204 01/13/15 0120  WBC 8.5 8.6  HGB 13.4 12.5*  HCT 41.8 38.3*  PLT 219 204  MCV 91.9 90.8  MCH 29.5 29.6  MCHC 32.1 32.6  RDW 14.1 14.2  LYMPHSABS 1.4  --   MONOABS 0.6  --   EOSABS 0.1  --   BASOSABS 0.0  --     Chemistries   Recent Labs Lab 01/12/15 1204 01/13/15 0120 01/14/15 0344  NA 142 140 140  K 3.8 3.9 4.2  CL 107 107 106  CO2 24 23 24   GLUCOSE 127* 106* 122*  BUN 19 21 21   CREATININE 1.38* 1.25 1.29  CALCIUM 9.1 8.6 9.2  AST  --  14  --   ALT  --  13  --   ALKPHOS  --  50  --   BILITOT  --  0.6  --     ------------------------------------------------------------------------------------------------------------------ estimated creatinine clearance is 85.5 mL/min (by C-G formula based on Cr of 1.29). ------------------------------------------------------------------------------------------------------------------ No results for input(s): HGBA1C in the last 72 hours. ------------------------------------------------------------------------------------------------------------------ No results for input(s): CHOL, HDL, LDLCALC, TRIG, CHOLHDL, LDLDIRECT in the last 72 hours. ------------------------------------------------------------------------------------------------------------------  Recent Labs  01/14/15 1140  TSH 3.727   ------------------------------------------------------------------------------------------------------------------  No results for input(s): VITAMINB12, FOLATE, FERRITIN, TIBC, IRON, RETICCTPCT in the last 72 hours.  Coagulation profile No results for input(s): INR, PROTIME in the last 168 hours.  No results for input(s): DDIMER in the last 72 hours.  Cardiac Enzymes  Recent Labs Lab 01/12/15 1900 01/13/15 0120 01/13/15 0700  TROPONINI 0.03 0.04* <0.03   ------------------------------------------------------------------------------------------------------------------ Invalid input(s): POCBNP     Time Spent in minutes   35   SINGH,PRASHANT K M.D on 01/15/2015 at 10:35 AM  Between 7am to 7pm - Pager - [phone]  After 7pm go to [email or web address] - password [facility]  [facility] Office  [phone]

## 2015-01-15 NOTE — Progress Notes (Signed)
MD instructed to stop cardizem drip. Charles Mckinney

## 2015-01-15 NOTE — Progress Notes (Signed)
Pt HR up to 130 this AM, sustaining 120s A fib. When Pt assessed, pt ambulating in room from bed to chair and had spilled water on floor. Pt now resting in chair with HR 90-115. Pt still complaining of shortness of breath on exertion as well as unable to sleep lying down. Pt states he has to sit in recliner to sleep.  Will continue to monitor.

## 2015-01-16 LAB — BASIC METABOLIC PANEL
Anion gap: 8 (ref 5–15)
BUN: 23 mg/dL (ref 6–23)
CO2: 29 mmol/L (ref 19–32)
CREATININE: 1.41 mg/dL — AB (ref 0.50–1.35)
Calcium: 9 mg/dL (ref 8.4–10.5)
Chloride: 103 mmol/L (ref 96–112)
GFR calc Af Amer: 59 mL/min — ABNORMAL LOW (ref >90)
GFR calc non Af Amer: 51 mL/min — ABNORMAL LOW (ref >90)
Glucose, Bld: 117 mg/dL — ABNORMAL HIGH (ref 70–99)
Potassium: 4.5 mmol/L (ref 3.5–5.1)
SODIUM: 140 mmol/L (ref 135–145)

## 2015-01-16 LAB — MAGNESIUM: MAGNESIUM: 2.3 mg/dL (ref 1.5–2.5)

## 2015-01-16 MED ORDER — LISINOPRIL 2.5 MG PO TABS
2.5000 mg | ORAL_TABLET | Freq: Every day | ORAL | Status: DC
  Administered 2015-01-16 – 2015-01-20 (×5): 2.5 mg via ORAL
  Filled 2015-01-16 (×5): qty 1

## 2015-01-16 MED ORDER — AMIODARONE HCL 200 MG PO TABS
400.0000 mg | ORAL_TABLET | Freq: Three times a day (TID) | ORAL | Status: DC
  Administered 2015-01-16 – 2015-01-20 (×12): 400 mg via ORAL
  Filled 2015-01-16 (×16): qty 2

## 2015-01-16 NOTE — Progress Notes (Signed)
Subjective:  Dyspnea has improved overnight.  Still edematous, and starting to diurese.   Objective:  Vital Signs in the last 24 hours: BP 101/71 mmHg  Pulse 115  Temp(Src) 97.6 F (36.4 C) (Oral)  Resp 16  Ht 5\' 10"  (1.778 m)  Wt 149.823 kg (330 lb 4.8 oz)  BMI 47.39 kg/m2  SpO2 95%  Physical Exam: Extremely large morbidly obese male in no acute distress Lungs: Reduced breath sounds at bases, minimal rales  Cardiac:  Irregular rhythm, normal S1 and S2, no S3 Abdomen:  Soft, nontender, no masses Extremities:  2+ edema present  Intake/Output from previous day: 02/11 0701 - 02/12 0700 In: 1080 [P.O.:1080] Out: 4350 [Urine:4350] Weight Filed Weights   01/14/15 1630 01/15/15 0428 01/16/15 0438  Weight: 151.501 kg (334 lb) 151.683 kg (334 lb 6.4 oz) 149.823 kg (330 lb 4.8 oz)    Lab Results: Basic Metabolic Panel:  Recent Labs  00/34/91 0344 01/16/15 0435  NA 140 140  K 4.2 4.5  CL 106 103  CO2 24 29  GLUCOSE 122* 117*  BUN 21 23  CREATININE 1.29 1.41*     BNP    Component Value Date/Time   BNP 503.3* 01/12/2015 1204    PROTIME: Lab Results  Component Value Date   INR 0.99 02/01/2014    Telemetry: Atrial fibrillation and flutter now with variable response  Assessment/Plan: 1. Acute on chronic systolic congestive heart failure worsened likely by the development of atrial fibrillation with rapid response, some medical noncompliance, and recent viral infection  Continue diuresis  2. Atrial fibrillation with rapid ventricular response at times.  WIll increase amiodarone.  The diltiazem was discontinued yesterday due to low BP..  May consider cardioversion if still in a fib over weekend 3. Anticoagulation with XARELTO 4. Morbid obesity 5. Medical noncompliance 6. Cardiomyopathy-type still undetermined. 7. Worsening renal insufficiency will need to watch.   Recommendations:  Increase amiodarone to help with rate control.     Darden Palmer  MD [facility]  01/16/2015, 8:45 AM

## 2015-01-16 NOTE — Progress Notes (Addendum)
Patient Demographics  Charles Mckinney, is a 64 y.o. male, DOB - [birth year], ZOX:[id number]  Admit date - 01/12/2015   Admitting Physician Jerald Kief, MD  Outpatient Primary MD for the patient is Default, Provider, MD  LOS - 4   Chief Complaint  Patient presents with  . Shortness of Breath        Subjective:   Charles Mckinney today has, No headache, No chest pain, No abdominal pain - No Nausea, No new weakness tingling or numbness, No Cough -  Improved SOB.    Assessment & Plan    1. 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 Improving with diuresis, continue Lasix , salt and fluid restriction. Cardiology consulted. Have added low-dose ACE inhibitor.  Filed Weights   01/14/15 1630 01/15/15 0428 01/16/15 0438  Weight: 151.501 kg (334 lb) 151.683 kg (334 lb 6.4 oz) 149.823 kg (330 lb 4.8 oz)     2. A. fib with RVR. [place] Vasc 2 score - 4. He is on xaralto since 12/22/2014, cardiology is managing his A. fib and blood pressure medications. He is on amiodarone orally along with oral Coreg-ACE inhibitor and Cardizem drip. Will defer management of this problem to cardiology.   3. Essential hypertension. On moderate to high dose Coreg along with low-dose Ace. Also on Cardizem drip with soft blood pressures, Defer dose adjustment to cardiology.   4. Dyslipidemia. On home dose statin.   5. CAD. Chest pain-free. On aspirin and Coreg and statin for secondary prevention.   6. Mild ARF. Being diuresed by cardiology. Will request to kindly cut back on blood pressure medications to avoid further blood pressure drop.    Code Status: Full  Family Communication: none present  Disposition Plan:  Home   Procedures   TTE  - Left ventricle: Dificult study. With contrast, it appears that the EF is in the 35% range. The cavity size was mildly dilated. Wall thickness was increased in a pattern of mild LVH. Theestimated ejection fraction was 35%. Diffuse hypokinesis. - Left atrium: The atrium was moderately dilated. - Right ventricle: The cavity size was mildly dilated. Systolicfunction was mildly reduced.   Consults Cards   Medications  Scheduled Meds: . amiodarone  400 mg Oral TID  . aspirin  81 mg Oral Daily  . carvedilol  12.5 mg Oral BID WC  . furosemide  40 mg Intravenous 3 times per day  . lisinopril  2.5 mg Oral Daily  . potassium chloride SA  40 mEq Oral Daily  . pravastatin  40 mg Oral Q2200  . rivaroxaban  20 mg Oral Q supper  . sodium chloride  3 mL Intravenous Q12H   Continuous Infusions: . diltiazem (CARDIZEM) infusion Stopped (01/15/15 1514)   PRN Meds:.acetaminophen **OR** acetaminophen, morphine injection, nitroGLYCERIN  DVT Prophylaxis  Xaralto  Lab Results  Component Value Date   PLT 204 01/13/2015    Antibiotics     Anti-infectives    None          Objective:   Filed Vitals:   01/15/15 2146 01/15/15 2241 01/16/15 0438 01/16/15 1041  BP: 103/74  101/71 92/62  Pulse:  101 115 114  Temp: 97.4 F (36.3 C)  97.6 F (36.4 C) 97.6 F (36.4 C)  TempSrc: Oral  Oral Oral  Resp:   16 18  Height:      Weight:   149.823 kg (330 lb 4.8 oz)   SpO2: 97%  95% 93%    Wt Readings from Last 3 Encounters:  01/16/15 149.823 kg (330 lb 4.8 oz)  12/22/14 148.326 kg (327 lb)  02/03/14 138.3 kg (304 lb 14.3 oz)     Intake/Output Summary (Last 24 hours) at 01/16/15 1114 Last data filed at 01/16/15 1050  Gross per 24 hour  Intake   1323 ml  Output   3925 ml  Net  -2602 ml     Physical Exam  Awake Alert, Oriented X 3, No new F.N deficits, Normal affect [place].AT,PERRAL Supple Neck, +veJVD, No cervical lymphadenopathy appriciated.   Symmetrical Chest wall movement, Good air movement bilaterally, few rales iRRR,No Gallops,Rubs or new Murmurs, No Parasternal Heave +ve B.Sounds, Abd Soft, No tenderness, No organomegaly appriciated, No rebound - guarding or rigidity. No Cyanosis, Clubbing , 1+ edema, No new Rash or bruise     Data Review   Micro Results Recent Results (from the past 240 hour(s))  MRSA PCR Screening     Status: None   Collection Time: 01/12/15 11:11 PM  Result Value Ref Range Status   MRSA by PCR NEGATIVE NEGATIVE Final    Comment:        The GeneXpert MRSA Assay (FDA approved for NASAL specimens only), is one component of a comprehensive MRSA colonization surveillance program. It is not intended to diagnose MRSA infection nor to guide or monitor treatment for MRSA infections.     Radiology Reports Dg Chest 2 View  01/12/2015   CLINICAL DATA:  Shortness of breath  EXAM: CHEST  2 VIEW  COMPARISON:  12/22/2014  FINDINGS: Chronic cardiomegaly and vascular pedicle widening. There is diffuse interstitial opacity with small bilateral pleural effusions, similar to prior. Mild atelectasis is suspected at the bases.  IMPRESSION: Mild CHF.   Electronically Signed   By: Tiburcio Pea M.D.   On: 01/12/2015 13:11   Dg Chest 2 View  12/22/2014   CLINICAL DATA:  Shortness of breath.  Chest pain.  EXAM: CHEST  2 VIEW  COMPARISON:  None.  FINDINGS: Mediastinum and hilar structures normal. Cardiomegaly with pulmonary vascular prominence and bilateral interstitial prominence. Small pleural effusion. No pneumothorax. Degenerative changes thoracic spine.  IMPRESSION: Congestive heart failure with mild interstitial edema.   Electronically Signed   By: Maisie Fus  Register   On: 12/22/2014 14:07     CBC  Recent Labs Lab 01/12/15 1204 01/13/15 0120  WBC 8.5 8.6  HGB 13.4 12.5*  HCT 41.8 38.3*  PLT 219 204  MCV 91.9 90.8  MCH 29.5 29.6  MCHC 32.1 32.6  RDW 14.1 14.2  LYMPHSABS 1.4  --   MONOABS 0.6  --    EOSABS 0.1  --   BASOSABS 0.0  --     Chemistries   Recent Labs Lab 01/12/15 1204 01/13/15 0120 01/14/15 0344 01/16/15 0435  NA 142 140 140 140  K 3.8 3.9 4.2 4.5  CL 107 107 106 103  CO2 GLUCOSE 127* 106* 122* 117*  BUN CREATININE 1.38* 1.25 1.29 1.41*  CALCIUM 9.1 8.6 9.2 9.0  MG  --   --   --  2.3  AST  --  14  --   --  ALT  --  13  --   --   ALKPHOS  --  50  --   --   BILITOT  --  0.6  --   --    ------------------------------------------------------------------------------------------------------------------ estimated creatinine clearance is 77.6 mL/min (by C-G formula based on Cr of 1.41). ------------------------------------------------------------------------------------------------------------------ No results for input(s): HGBA1C in the last 72 hours. ------------------------------------------------------------------------------------------------------------------ No results for input(s): CHOL, HDL, LDLCALC, TRIG, CHOLHDL, LDLDIRECT in the last 72 hours. ------------------------------------------------------------------------------------------------------------------  Recent Labs  01/14/15 1140  TSH 3.727   ------------------------------------------------------------------------------------------------------------------ No results for input(s): VITAMINB12, FOLATE, FERRITIN, TIBC, IRON, RETICCTPCT in the last 72 hours.  Coagulation profile No results for input(s): INR, PROTIME in the last 168 hours.  No results for input(s): DDIMER in the last 72 hours.  Cardiac Enzymes  Recent Labs Lab 01/12/15 1900 01/13/15 0120 01/13/15 0700  TROPONINI 0.03 0.04* <0.03   ------------------------------------------------------------------------------------------------------------------ Invalid input(s): POCBNP     Time Spent in minutes   35   Miaa Latterell K M.D on 01/16/2015 at 11:14 AM  Between 7am to 7pm - Pager -  [phone]  After 7pm go to [email or web address] - password [facility]  [facility] Office  [phone]

## 2015-01-17 LAB — BASIC METABOLIC PANEL
Anion gap: 9 (ref 5–15)
BUN: 24 mg/dL — ABNORMAL HIGH (ref 6–23)
CHLORIDE: 102 mmol/L (ref 96–112)
CO2: 26 mmol/L (ref 19–32)
Calcium: 9.1 mg/dL (ref 8.4–10.5)
Creatinine, Ser: 1.33 mg/dL (ref 0.50–1.35)
GFR calc Af Amer: 64 mL/min — ABNORMAL LOW (ref >90)
GFR calc non Af Amer: 55 mL/min — ABNORMAL LOW (ref >90)
Glucose, Bld: 107 mg/dL — ABNORMAL HIGH (ref 70–99)
Potassium: 4.6 mmol/L (ref 3.5–5.1)
SODIUM: 137 mmol/L (ref 135–145)

## 2015-01-17 NOTE — Progress Notes (Signed)
Patient Demographics  Charles Mckinney, is a 64 y.o. male, DOB - Feb 09, 1951, SMO:[id number]  Admit date - 01/12/2015   Admitting Physician Jerald Kief, MD  Outpatient Primary MD for the patient is Default, Provider, MD  LOS - 5   Chief Complaint  Patient presents with  . Shortness of Breath        Subjective:   Charles Mckinney today has, No headache, No chest pain, No abdominal pain - No Nausea, No new weakness tingling or numbness, No Cough -  Improved SOB.    Assessment & Plan    1. 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 Improving with diuresis, continue Lasix , salt and fluid restriction. Cardiology consulted. Have added low-dose ACE inhibitor.  Filed Weights   01/15/15 0428 01/16/15 0438 01/17/15 0440  Weight: 151.683 kg (334 lb 6.4 oz) 149.823 kg (330 lb 4.8 oz) 148.145 kg (326 lb 9.6 oz)     2. A. fib with RVR. [place] Vasc 2 score - 4. He is on xaralto since 12/22/2014, cardiology is managing his A. fib and blood pressure medications. He is on amiodarone orally along with oral Coreg-ACE , stopped Cardizem dri 01-17-15. Will defer management of this problem to cardiology. Per cardiology likely to be cardioverted on Monday if remains in A. fib.   3. Essential hypertension. On moderate dose Coreg along with low-dose Ace.  Defer dose adjustment to cardiology.   4. Dyslipidemia. On home dose statin.   5. CAD. Chest pain-free. On aspirin and Coreg and statin for secondary prevention.   6. Mild ARF. Being diuresed by cardiology. Stable today Monitor.    Code Status: Full  Family Communication: none present  Disposition Plan: Home   Procedures   TTE  - Left ventricle: Dificult study.  With contrast, it appears that the EF is in the 35% range. The cavity size was mildly dilated. Wall thickness was increased in a pattern of mild LVH. Theestimated ejection fraction was 35%. Diffuse hypokinesis. - Left atrium: The atrium was moderately dilated. - Right ventricle: The cavity size was mildly dilated. Systolicfunction was mildly reduced.   Consults Cards   Medications  Scheduled Meds: . amiodarone  400 mg Oral TID  . aspirin  81 mg Oral Daily  . carvedilol  12.5 mg Oral BID WC  . furosemide  40 mg Intravenous 3 times per day  . lisinopril  2.5 mg Oral Daily  . potassium chloride SA  40 mEq Oral Daily  . pravastatin  40 mg Oral Q2200  . rivaroxaban  20 mg Oral Q supper  . sodium chloride  3 mL Intravenous Q12H   Continuous Infusions:   PRN Meds:.acetaminophen **OR** acetaminophen, morphine injection, nitroGLYCERIN  DVT Prophylaxis  Xaralto  Lab Results  Component Value Date   PLT 204 01/13/2015    Antibiotics     Anti-infectives    None          Objective:   Filed Vitals:   01/16/15 1448 01/16/15 2011 01/17/15 0440 01/17/15 0853  BP: 111/61 104/89 122/90 103/60  Pulse: 62 115 56 91  Temp: 97.3 F (36.3 C) 97.6 F (36.4 C) 97.5 F (36.4 C)  TempSrc: Oral Oral Oral   Resp: Height:      Weight:   148.145 kg (326 lb 9.6 oz)   SpO2: 95% 98% 98% 98%    Wt Readings from Last 3 Encounters:  01/17/15 148.145 kg (326 lb 9.6 oz)  12/22/14 148.326 kg (327 lb)  02/03/14 138.3 kg (304 lb 14.3 oz)     Intake/Output Summary (Last 24 hours) at 01/17/15 1055 Last data filed at 01/17/15 1019  Gross per 24 hour  Intake   1263 ml  Output   1775 ml  Net   -512 ml     Physical Exam  Awake Alert, Oriented X 3, No new F.N deficits, Normal affect [place].AT,PERRAL Supple Neck, +veJVD, No cervical lymphadenopathy appriciated.  Symmetrical Chest wall movement, Good air movement bilaterally, few rales iRRR,No Gallops,Rubs or new Murmurs,  No Parasternal Heave +ve B.Sounds, Abd Soft, No tenderness, No organomegaly appriciated, No rebound - guarding or rigidity. No Cyanosis, Clubbing , 1+ edema, No new Rash or bruise     Data Review   Micro Results Recent Results (from the past 240 hour(s))  MRSA PCR Screening     Status: None   Collection Time: 01/12/15 11:11 PM  Result Value Ref Range Status   MRSA by PCR NEGATIVE NEGATIVE Final    Comment:        The GeneXpert MRSA Assay (FDA approved for NASAL specimens only), is one component of a comprehensive MRSA colonization surveillance program. It is not intended to diagnose MRSA infection nor to guide or monitor treatment for MRSA infections.     Radiology Reports Dg Chest 2 View  01/12/2015   CLINICAL DATA:  Shortness of breath  EXAM: CHEST  2 VIEW  COMPARISON:  12/22/2014  FINDINGS: Chronic cardiomegaly and vascular pedicle widening. There is diffuse interstitial opacity with small bilateral pleural effusions, similar to prior. Mild atelectasis is suspected at the bases.  IMPRESSION: Mild CHF.   Electronically Signed   By: Tiburcio Pea M.D.   On: 01/12/2015 13:11   Dg Chest 2 View  12/22/2014   CLINICAL DATA:  Shortness of breath.  Chest pain.  EXAM: CHEST  2 VIEW  COMPARISON:  None.  FINDINGS: Mediastinum and hilar structures normal. Cardiomegaly with pulmonary vascular prominence and bilateral interstitial prominence. Small pleural effusion. No pneumothorax. Degenerative changes thoracic spine.  IMPRESSION: Congestive heart failure with mild interstitial edema.   Electronically Signed   By: Maisie Fus  Register   On: 12/22/2014 14:07     CBC  Recent Labs Lab 01/12/15 1204 01/13/15 0120  WBC 8.5 8.6  HGB 13.4 12.5*  HCT 41.8 38.3*  PLT 219 204  MCV 91.9 90.8  MCH 29.5 29.6  MCHC 32.1 32.6  RDW 14.1 14.2  LYMPHSABS 1.4  --   MONOABS 0.6  --   EOSABS 0.1  --   BASOSABS 0.0  --     Chemistries   Recent Labs Lab 01/12/15 1204 01/13/15 0120  01/14/15 0344 01/16/15 0435 01/17/15 0351  NA 142 140 140 140 137  K 3.8 3.9 4.2 4.5 4.6  CL 107 107 106 103 102  CO2 GLUCOSE 127* 106* 122* 117* 107*  BUN 24*  CREATININE 1.38* 1.25 1.29 1.41* 1.33  CALCIUM 9.1 8.6 9.2 9.0 9.1  MG  --   --   --  2.3  --   AST  --  14  --   --   --  ALT  --  13  --   --   --   ALKPHOS  --  50  --   --   --   BILITOT  --  0.6  --   --   --    ------------------------------------------------------------------------------------------------------------------ estimated creatinine clearance is 81.7 mL/min (by C-G formula based on Cr of 1.33). ------------------------------------------------------------------------------------------------------------------ No results for input(s): HGBA1C in the last 72 hours. ------------------------------------------------------------------------------------------------------------------ No results for input(s): CHOL, HDL, LDLCALC, TRIG, CHOLHDL, LDLDIRECT in the last 72 hours. ------------------------------------------------------------------------------------------------------------------  Recent Labs  01/14/15 1140  TSH 3.727   ------------------------------------------------------------------------------------------------------------------ No results for input(s): VITAMINB12, FOLATE, FERRITIN, TIBC, IRON, RETICCTPCT in the last 72 hours.  Coagulation profile No results for input(s): INR, PROTIME in the last 168 hours.  No results for input(s): DDIMER in the last 72 hours.  Cardiac Enzymes  Recent Labs Lab 01/12/15 1900 01/13/15 0120 01/13/15 0700  TROPONINI 0.03 0.04* <0.03   ------------------------------------------------------------------------------------------------------------------ Invalid input(s): POCBNP     Time Spent in minutes   35   SINGH,PRASHANT K M.D on 01/17/2015 at 10:55 AM  Between 7am to 7pm - Pager - [phone]  After 7pm go to [email or web address]  - password [facility]  [facility] Office  [phone]

## 2015-01-17 NOTE — Progress Notes (Signed)
Subjective:  Still with some orthopnea, dyspnea better when sitting up.  Continues to be edematous.  Edema is still present but improving.   Objective:  Vital Signs in the last 24 hours: BP 103/60 mmHg  Pulse 91  Temp(Src) 97.5 F (36.4 C) (Oral)  Resp 20  Ht 5\' 10"  (1.778 m)  Wt 148.145 kg (326 lb 9.6 oz)  BMI 46.86 kg/m2  SpO2 98%  Physical Exam: Extremely large morbidly obese male in no acute distress Lungs: Reduced breath sounds at bases, minimal rales  Cardiac:  Irregular rhythm, normal S1 and S2, no S3 Abdomen:  Soft, nontender, no masses Extremities:  2+ edema present  Intake/Output from previous day: 02/12 0701 - 02/13 0700 In: 1323 [P.O.:1320; I.V.:3] Out: 1600 [Urine:1600]   Weight Filed Weights   01/15/15 0428 01/16/15 0438 01/17/15 0440  Weight: 151.683 kg (334 lb 6.4 oz) 149.823 kg (330 lb 4.8 oz) 148.145 kg (326 lb 9.6 oz)    Lab Results: Basic Metabolic Panel:  Recent Labs  19/16/60 0435 01/17/15 0351  NA 140 137  K 4.5 4.6  CL 103 102  CO2 29 26  GLUCOSE 117* 107*  BUN 23 24*  CREATININE 1.41* 1.33     BNP    Component Value Date/Time   BNP 503.3* 01/12/2015 1204    PROTIME: Lab Results  Component Value Date   INR 0.99 02/01/2014    Telemetry: Atrial fibrillation with variable response, rates in the low 100s   Assessment/Plan: 1. Acute on chronic systolic congestive heart failure worsened likely by the development of atrial fibrillation with rapid response, some medical noncompliance, and recent viral infection  Continue diuresis  2. Atrial fibrillation with rapid ventricular response at times.  Amiodarone was increased yesterday.  If does not convert to sinus rhythm over weekend probably will cardiovert on Monday.   3. Anticoagulation with XARELTO 4. Morbid obesity 5. Medical noncompliance 6. Cardiomyopathy-type still undetermined. 7.  Renal insufficiency stable   Recommendations:  He is coming down and he is  diuresing.  Watch over weekend and continued to diurese and watch renal function.  Plan to cardiovert Monday if still in atrial fib.   Darden Palmer  MD [facility]  01/17/2015, 9:14 AM

## 2015-01-18 ENCOUNTER — Inpatient Hospital Stay (HOSPITAL_COMMUNITY): Payer: Non-veteran care

## 2015-01-18 LAB — BASIC METABOLIC PANEL
ANION GAP: 12 (ref 5–15)
BUN: 26 mg/dL — AB (ref 6–23)
CO2: 24 mmol/L (ref 19–32)
Calcium: 9.4 mg/dL (ref 8.4–10.5)
Chloride: 103 mmol/L (ref 96–112)
Creatinine, Ser: 1.37 mg/dL — ABNORMAL HIGH (ref 0.50–1.35)
GFR calc Af Amer: 61 mL/min — ABNORMAL LOW (ref >90)
GFR, EST NON AFRICAN AMERICAN: 53 mL/min — AB (ref >90)
GLUCOSE: 107 mg/dL — AB (ref 70–99)
POTASSIUM: 4.2 mmol/L (ref 3.5–5.1)
Sodium: 139 mmol/L (ref 135–145)

## 2015-01-18 LAB — URIC ACID: URIC ACID, SERUM: 11.7 mg/dL — AB (ref 4.0–7.8)

## 2015-01-18 MED ORDER — COLCHICINE 0.6 MG PO TABS
0.6000 mg | ORAL_TABLET | Freq: Two times a day (BID) | ORAL | Status: DC
  Administered 2015-01-18 – 2015-01-20 (×5): 0.6 mg via ORAL
  Filled 2015-01-18 (×6): qty 1

## 2015-01-18 MED ORDER — SODIUM CHLORIDE 0.9 % IV SOLN
INTRAVENOUS | Status: DC
  Administered 2015-01-19: 08:00:00 via INTRAVENOUS

## 2015-01-18 NOTE — Progress Notes (Signed)
Subjective:  Complains of left ankle pain today.  Still with some dyspnea but clearly better.  Difficulty laying flat some.  Continues in atrial fibrillation.  Weight down further.  Objective:  Vital Signs in the last 24 hours: BP 121/86 mmHg  Pulse 66  Temp(Src) 98.2 F (36.8 C) (Oral)  Resp 18  Ht 5\' 10"  (1.778 m)  Wt 147.918 kg (326 lb 1.6 oz)  BMI 46.79 kg/m2  SpO2 98%  Physical Exam: Extremely large morbidly obese male in no acute distress Lungs: Reduced breath sounds at bases, minimal rales  Cardiac:  Irregular rhythm, normal S1 and S2, no S3 Abdomen:  Soft, nontender, no masses Extremities:  2+ edema present  Intake/Output from previous day: 02/13 0701 - 02/14 0700 In: 1023 [P.O.:1020; I.V.:3] Out: 2151 [Urine:2150; Stool:1] Weight Filed Weights   01/16/15 0438 01/17/15 0440 01/18/15 0526  Weight: 149.823 kg (330 lb 4.8 oz) 148.145 kg (326 lb 9.6 oz) 147.918 kg (326 lb 1.6 oz)    Lab Results: Basic Metabolic Panel:  Recent Labs  65/79/03 0351 01/18/15 0533  NA 137 139  K 4.6 4.2  CL 102 103  CO2 26 24  GLUCOSE 107* 107*  BUN 24* 26*  CREATININE 1.33 1.37*     BNP    Component Value Date/Time   BNP 503.3* 01/12/2015 1204    PROTIME: Lab Results  Component Value Date   INR 0.99 02/01/2014    Telemetry: Atrial fibrillation and flutter with variable response in the low 100s.    Assessment/Plan: 1. Acute on chronic systolic congestive heart failure worsened likely by the development of atrial fibrillation with rapid response, some medical noncompliance, and recent viral infection  Continue diuresis  2. Atrial fibrillation -rate better controlled.  Since he is still in atrial fibrillation plan cardioversion tomorrow.   3. Anticoagulation with XARELTO 4. Morbid obesity 5. Medical noncompliance 6. Cardiomyopathy-type still undetermined. 7.  Worsening renal function but relatively stable   Recommendations:  Plan cardioversion  tomorrow.  Cardioversion talked about with patient.  Discussed risks and he is agreeable to proceed.  Plan to keep nothing by mouth and plan this tomorrow.  Darden Palmer  MD [facility]  01/18/2015, 10:41 AM

## 2015-01-18 NOTE — Progress Notes (Signed)
Patient Demographics  Charles Mckinney, is a 64 y.o. male, DOB - [birth year], ASU:[id number]  Admit date - 01/12/2015   Admitting Physician Jerald Kief, MD  Outpatient Primary MD for the patient is Default, Provider, MD  LOS - 6   Chief Complaint  Patient presents with  . Shortness of Breath        Subjective:   [name] today has, No headache, No chest pain, No abdominal pain - No Nausea, No new weakness tingling or numbness, No Cough -  Improved SOB.    Assessment & Plan    1. 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 Improving with diuresis, continue Lasix , salt and fluid restriction. Cardiology consulted. Have added low-dose ACE inhibitor.  Filed Weights   01/16/15 0438 01/17/15 0440 01/18/15 0526  Weight: 149.823 kg (330 lb 4.8 oz) 148.145 kg (326 lb 9.6 oz) 147.918 kg (326 lb 1.6 oz)     2. A. fib with RVR. [place] Vasc 2 score - 4. He is on xaralto since 12/22/2014, cardiology is managing his A. fib and blood pressure medications. He is on amiodarone orally along with oral Coreg-ACE , rate better, stopped Cardizem dri 01-17-15. Will defer management of this problem to cardiology. Per cardiology likely to be cardioverted on Monday if remains in A. fib.   3. Essential hypertension. On moderate dose Coreg along with low-dose Ace.  Defer dose adjustment to cardiology.   4. Dyslipidemia. On home dose statin.   5. CAD. Chest pain-free. On aspirin and Coreg and statin for secondary prevention.   6. Mild ARF. Being diuresed by cardiology. Stable today Monitor.   7. Left ankle pain. Question mild gout. Check x-ray, uric acid level, place on low-dose Colchicine and  monitor.    Code Status:  Full  Family Communication: none present  Disposition Plan: Home   Procedures   TTE  - Left ventricle: Dificult study. With contrast, it appears that the EF is in the 35% range. The cavity size was mildly dilated. Wall thickness was increased in a pattern of mild LVH. Theestimated ejection fraction was 35%. Diffuse hypokinesis. - Left atrium: The atrium was moderately dilated. - Right ventricle: The cavity size was mildly dilated. Systolicfunction was mildly reduced.   Consults Cards   Medications  Scheduled Meds: . amiodarone  400 mg Oral TID  . aspirin  81 mg Oral Daily  . carvedilol  12.5 mg Oral BID WC  . colchicine  0.6 mg Oral BID  . furosemide  40 mg Intravenous 3 times per day  . lisinopril  2.5 mg Oral Daily  . potassium chloride SA  40 mEq Oral Daily  . pravastatin  40 mg Oral Q2200  . rivaroxaban  20 mg Oral Q supper  . sodium chloride  3 mL Intravenous Q12H   Continuous Infusions:   PRN Meds:.acetaminophen **OR** acetaminophen, morphine injection, nitroGLYCERIN  DVT Prophylaxis  Xaralto  Lab Results  Component Value Date   PLT 204 01/13/2015    Antibiotics     Anti-infectives    None          Objective:   Filed Vitals:   01/17/15 0853 01/17/15  1409 01/17/15 2008 01/18/15 0526  BP: 103/60 100/62 101/67 121/86  Pulse: 91 70 62 66  Temp:  98.4 F (36.9 C) 98.4 F (36.9 C) 98.2 F (36.8 C)  TempSrc:  Oral Oral Oral  Resp: Height:      Weight:    147.918 kg (326 lb 1.6 oz)  SpO2: 98% 95% 99% 98%    Wt Readings from Last 3 Encounters:  01/18/15 147.918 kg (326 lb 1.6 oz)  12/22/14 148.326 kg (327 lb)  02/03/14 138.3 kg (304 lb 14.3 oz)     Intake/Output Summary (Last 24 hours) at 01/18/15 0950 Last data filed at 01/18/15 1610  Gross per 24 hour  Intake   1023 ml  Output   2201 ml  Net  -1178 ml     Physical Exam  Awake Alert, Oriented X 3, No new F.N deficits, Normal affect [place].AT,PERRAL Supple Neck,  +veJVD, No cervical lymphadenopathy appriciated.  Symmetrical Chest wall movement, Good air movement bilaterally, few rales iRRR,No Gallops,Rubs or new Murmurs, No Parasternal Heave +ve B.Sounds, Abd Soft, No tenderness, No organomegaly appriciated, No rebound - guarding or rigidity. No Cyanosis, Clubbing , 1+ edema, No new Rash or bruise   Left ankle tender to palpate on the left lateral aspect.   Data Review   Micro Results Recent Results (from the past 240 hour(s))  MRSA PCR Screening     Status: None   Collection Time: 01/12/15 11:11 PM  Result Value Ref Range Status   MRSA by PCR NEGATIVE NEGATIVE Final    Comment:        The GeneXpert MRSA Assay (FDA approved for NASAL specimens only), is one component of a comprehensive MRSA colonization surveillance program. It is not intended to diagnose MRSA infection nor to guide or monitor treatment for MRSA infections.     Radiology Reports Dg Chest 2 View  01/12/2015   CLINICAL DATA:  Shortness of breath  EXAM: CHEST  2 VIEW  COMPARISON:  12/22/2014  FINDINGS: Chronic cardiomegaly and vascular pedicle widening. There is diffuse interstitial opacity with small bilateral pleural effusions, similar to prior. Mild atelectasis is suspected at the bases.  IMPRESSION: Mild CHF.   Electronically Signed   By: Tiburcio Pea M.D.   On: 01/12/2015 13:11   Dg Chest 2 View  12/22/2014   CLINICAL DATA:  Shortness of breath.  Chest pain.  EXAM: CHEST  2 VIEW  COMPARISON:  None.  FINDINGS: Mediastinum and hilar structures normal. Cardiomegaly with pulmonary vascular prominence and bilateral interstitial prominence. Small pleural effusion. No pneumothorax. Degenerative changes thoracic spine.  IMPRESSION: Congestive heart failure with mild interstitial edema.   Electronically Signed   By: Maisie Fus  Register   On: 12/22/2014 14:07     CBC  Recent Labs Lab 01/12/15 1204 01/13/15 0120  WBC 8.5 8.6  HGB 13.4 12.5*  HCT 41.8 38.3*  PLT 219 204   MCV 91.9 90.8  MCH 29.5 29.6  MCHC 32.1 32.6  RDW 14.1 14.2  LYMPHSABS 1.4  --   MONOABS 0.6  --   EOSABS 0.1  --   BASOSABS 0.0  --     Chemistries   Recent Labs Lab 01/13/15 0120 01/14/15 0344 01/16/15 0435 01/17/15 0351 01/18/15 0533  NA 140 140 140 137 139  K 3.9 4.2 4.5 4.6 4.2  CL 107 106 103 102 103  CO2 GLUCOSE 106* 122* 117* 107* 107*  BUN 21 21  23 24* 26*  CREATININE 1.25 1.29 1.41* 1.33 1.37*  CALCIUM 8.6 9.2 9.0 9.1 9.4  MG  --   --  2.3  --   --   AST 14  --   --   --   --   ALT 13  --   --   --   --   ALKPHOS 50  --   --   --   --   BILITOT 0.6  --   --   --   --    ------------------------------------------------------------------------------------------------------------------ estimated creatinine clearance is 79.4 mL/min (by C-G formula based on Cr of 1.37). ------------------------------------------------------------------------------------------------------------------ No results for input(s): HGBA1C in the last 72 hours. ------------------------------------------------------------------------------------------------------------------ No results for input(s): CHOL, HDL, LDLCALC, TRIG, CHOLHDL, LDLDIRECT in the last 72 hours. ------------------------------------------------------------------------------------------------------------------ No results for input(s): TSH, T4TOTAL, T3FREE, THYROIDAB in the last 72 hours.  Invalid input(s): FREET3 ------------------------------------------------------------------------------------------------------------------ No results for input(s): VITAMINB12, FOLATE, FERRITIN, TIBC, IRON, RETICCTPCT in the last 72 hours.  Coagulation profile No results for input(s): INR, PROTIME in the last 168 hours.  No results for input(s): DDIMER in the last 72 hours.  Cardiac Enzymes  Recent Labs Lab 01/12/15 1900 01/13/15 0120 01/13/15 0700  TROPONINI 0.03 0.04* <0.03    ------------------------------------------------------------------------------------------------------------------ Invalid input(s): POCBNP     Time Spent in minutes   35   Renalda Locklin K M.D on 01/18/2015 at 9:50 AM  Between 7am to 7pm - Pager - [phone]  After 7pm go to [email or web address] - password [facility]  [facility] Office  [phone]

## 2015-01-19 ENCOUNTER — Encounter (HOSPITAL_COMMUNITY): Payer: Self-pay | Admitting: Certified Registered Nurse Anesthetist

## 2015-01-19 ENCOUNTER — Inpatient Hospital Stay (HOSPITAL_COMMUNITY): Payer: Non-veteran care | Admitting: Anesthesiology

## 2015-01-19 ENCOUNTER — Encounter (HOSPITAL_COMMUNITY)
Admission: EM | Disposition: A | Discharge status: Home / Self Care | Payer: Self-pay | Source: Home / Self Care | Attending: Internal Medicine

## 2015-01-19 ENCOUNTER — Encounter (HOSPITAL_COMMUNITY): Payer: Self-pay | Admitting: *Deleted

## 2015-01-19 DIAGNOSIS — I5043 Acute on chronic combined systolic (congestive) and diastolic (congestive) heart failure: Secondary | ICD-10-CM | POA: Diagnosis present

## 2015-01-19 DIAGNOSIS — I5042 Chronic combined systolic (congestive) and diastolic (congestive) heart failure: Secondary | ICD-10-CM

## 2015-01-19 DIAGNOSIS — Z7901 Long term (current) use of anticoagulants: Secondary | ICD-10-CM

## 2015-01-19 DIAGNOSIS — I429 Cardiomyopathy, unspecified: Secondary | ICD-10-CM

## 2015-01-19 HISTORY — PX: CARDIOVERSION: SHX1299

## 2015-01-19 HISTORY — DX: Chronic combined systolic (congestive) and diastolic (congestive) heart failure: I50.42

## 2015-01-19 HISTORY — DX: Long term (current) use of anticoagulants: Z79.01

## 2015-01-19 SURGERY — CARDIOVERSION
Anesthesia: Monitor Anesthesia Care

## 2015-01-19 MED ORDER — HYDROCORTISONE 1 % EX CREA
1.0000 "application " | TOPICAL_CREAM | Freq: Three times a day (TID) | CUTANEOUS | Status: DC | PRN
  Filled 2015-01-19: qty 28

## 2015-01-19 MED ORDER — PROMETHAZINE HCL 25 MG/ML IJ SOLN: 6.2500 mg | INTRAMUSCULAR | Status: DC | PRN

## 2015-01-19 MED ORDER — LIDOCAINE HCL (CARDIAC) 20 MG/ML IV SOLN
INTRAVENOUS | Status: DC | PRN
  Administered 2015-01-19: 60 mg via INTRAVENOUS

## 2015-01-19 MED ORDER — MEPERIDINE HCL 25 MG/ML IJ SOLN: 6.2500 mg | INTRAMUSCULAR | Status: DC | PRN

## 2015-01-19 MED ORDER — FENTANYL CITRATE 0.05 MG/ML IJ SOLN: 25.0000 ug | INTRAMUSCULAR | Status: DC | PRN

## 2015-01-19 MED ORDER — PROPOFOL 10 MG/ML IV BOLUS
INTRAVENOUS | Status: DC | PRN
  Administered 2015-01-19: 100 mg via INTRAVENOUS

## 2015-01-19 NOTE — Anesthesia Preprocedure Evaluation (Addendum)
Anesthesia Evaluation  Patient identified by MRN, date of birth, ID band Patient awake    Reviewed: Allergy & Precautions, NPO status , [name] Chart, lab work & pertinent test results, reviewed documented beta blocker date and time   Airway Mallampati: II  TM Distance: >3 FB Neck ROM: Full    Dental   Pulmonary former smoker,  Sleeps sitting         Cardiovascular + Past MI and +CHF + dysrhythmias Atrial Fibrillation  EF 35% 01/2015, CHF improving   Neuro/Psych    GI/Hepatic Neg liver ROS, GERD-  Medicated,  Endo/Other    Renal/GU Renal InsufficiencyRenal diseaseGFR 60     Musculoskeletal   Abdominal   Peds  Hematology   Anesthesia Other Findings   Reproductive/Obstetrics                            Anesthesia Physical Anesthesia Plan  ASA: IV  Anesthesia Plan: MAC   Post-op Pain Management:    Induction: Intravenous  Airway Management Planned: Nasal Cannula  Additional Equipment:   Intra-op Plan:   Post-operative Plan:   Informed Consent: I have reviewed the patients History and Physical, chart, labs and discussed the procedure including the risks, benefits and alternatives for the proposed anesthesia with the patient or authorized representative who has indicated his/her understanding and acceptance.     Plan Discussed with: CRNA and Surgeon  Anesthesia Plan Comments:        Anesthesia Quick Evaluation

## 2015-01-19 NOTE — Progress Notes (Signed)
Patient Demographics  Charles Mckinney, is a 64 y.o. male, DOB - 12-May-1951, ZOX:[id number]  Admit date - 01/12/2015   Admitting Physician Jerald Kief, MD  Outpatient Primary MD for the patient is Default, Provider, MD  LOS - 7   Chief Complaint  Patient presents with  . Shortness of Breath        Subjective:   [name] today has, No headache, No chest pain, No abdominal pain - No Nausea, No new weakness tingling or numbness, No Cough -  Improved SOB.    Assessment & Plan    1. 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 Improving with diuresis, continue Lasix , salt and fluid restriction. Cardiology consulted. On Coreg & ACE inhibitor.Ceasar Mons Weights   01/17/15 0440 01/18/15 0526 01/19/15 0646  Weight: 148.145 kg (326 lb 9.6 oz) 147.918 kg (326 lb 1.6 oz) 147.21 kg (324 lb 8.6 oz)     2. A. fib with RVR. [place] Vasc 2 score - 4. He is on xaralto since 12/22/2014, cardiology is managing his A. fib and blood pressure medications. He is on amiodarone orally along with oral Coreg-ACE , rate better, stopped Cardizem dri 01-17-15. Will defer management of this problem to cardiology. Per cardiology likely to be cardioverted today.   3. Essential hypertension. On moderate dose Coreg along with low-dose Ace.  Defer dose adjustment to cardiology.   4. Dyslipidemia. On home dose statin.   5. CAD. Chest pain-free. On aspirin and Coreg and statin for secondary prevention.   6. Mild ARF. Being diuresed by cardiology. Stable today Monitor.   7. Left ankle pain. Mild gout. Stable x-ray, elevated uric acid level, placed on low-dose Colchicine , better.    Code Status: Full  Family Communication: none  present  Disposition Plan: Home   Procedures   TTE  - Left ventricle: Dificult study. With contrast, it appears that the EF is in the 35% range. The cavity size was mildly dilated. Wall thickness was increased in a pattern of mild LVH. Theestimated ejection fraction was 35%. Diffuse hypokinesis. - Left atrium: The atrium was moderately dilated. - Right ventricle: The cavity size was mildly dilated. Systolicfunction was mildly reduced.   Consults Cards   Medications  Scheduled Meds: . amiodarone  400 mg Oral TID  . aspirin  81 mg Oral Daily  . carvedilol  12.5 mg Oral BID WC  . colchicine  0.6 mg Oral BID  . furosemide  40 mg Intravenous 3 times per day  . lisinopril  2.5 mg Oral Daily  . potassium chloride SA  40 mEq Oral Daily  . pravastatin  40 mg Oral Q2200  . rivaroxaban  20 mg Oral Q supper  . sodium chloride  3 mL Intravenous Q12H   Continuous Infusions: . sodium chloride 10 mL/hr at 01/19/15 0732   PRN Meds:.acetaminophen **OR** acetaminophen, morphine injection, nitroGLYCERIN  DVT Prophylaxis  Xaralto  Lab Results  Component Value Date   PLT 204 01/13/2015    Antibiotics     Anti-infectives    None          Objective:   Filed Vitals:   01/18/15 1502  01/18/15 1606 01/18/15 2100 01/19/15 0646  BP: 112/86 120/97 122/96 126/84  Pulse: 108 61 94 90  Temp: 98.8 F (37.1 C)  98.3 F (36.8 C) 98.3 F (36.8 C)  TempSrc: Oral  Oral Oral  Resp: 20  18 18   Height:      Weight:    147.21 kg (324 lb 8.6 oz)  SpO2: 97%  95% 98%    Wt Readings from Last 3 Encounters:  01/19/15 147.21 kg (324 lb 8.6 oz)  12/22/14 148.326 kg (327 lb)  02/03/14 138.3 kg (304 lb 14.3 oz)     Intake/Output Summary (Last 24 hours) at 01/19/15 0846 Last data filed at 01/19/15 3976  Gross per 24 hour  Intake    943 ml  Output   2125 ml  Net  -1182 ml     Physical Exam  Awake Alert, Oriented X 3, No new F.N deficits, Normal affect [place].AT,PERRAL Supple  Neck, +veJVD, No cervical lymphadenopathy appriciated.  Symmetrical Chest wall movement, Good air movement bilaterally, few rales iRRR,No Gallops,Rubs or new Murmurs, No Parasternal Heave +ve B.Sounds, Abd Soft, No tenderness, No organomegaly appriciated, No rebound - guarding or rigidity. No Cyanosis, Clubbing , 1+ edema, No new Rash or bruise   Left ankle tender to palpate on the left lateral aspect.   Data Review   Micro Results Recent Results (from the past 240 hour(s))  MRSA PCR Screening     Status: None   Collection Time: 01/12/15 11:11 PM  Result Value Ref Range Status   MRSA by PCR NEGATIVE NEGATIVE Final    Comment:        The GeneXpert MRSA Assay (FDA approved for NASAL specimens only), is one component of a comprehensive MRSA colonization surveillance program. It is not intended to diagnose MRSA infection nor to guide or monitor treatment for MRSA infections.     Radiology Reports Dg Chest 2 View  01/12/2015   CLINICAL DATA:  Shortness of breath  EXAM: CHEST  2 VIEW  COMPARISON:  12/22/2014  FINDINGS: Chronic cardiomegaly and vascular pedicle widening. There is diffuse interstitial opacity with small bilateral pleural effusions, similar to prior. Mild atelectasis is suspected at the bases.  IMPRESSION: Mild CHF.   Electronically Signed   By: Tiburcio Pea M.D.   On: 01/12/2015 13:11   Dg Chest 2 View  12/22/2014   CLINICAL DATA:  Shortness of breath.  Chest pain.  EXAM: CHEST  2 VIEW  COMPARISON:  None.  FINDINGS: Mediastinum and hilar structures normal. Cardiomegaly with pulmonary vascular prominence and bilateral interstitial prominence. Small pleural effusion. No pneumothorax. Degenerative changes thoracic spine.  IMPRESSION: Congestive heart failure with mild interstitial edema.   Electronically Signed   By: Maisie Fus  Register   On: 12/22/2014 14:07   Dg Ankle Complete Left  01/18/2015   CLINICAL DATA:  Patient with lateral ankle pain this morning upon standing.  Inability to bear weight. No new injury.  EXAM: LEFT ANKLE COMPLETE - 3+ VIEW  COMPARISON:  None.  FINDINGS: Marked soft tissue swelling about the ankle joint. Normal anatomic alignment. The talar dome is intact. No evidence for acute fracture or dislocation. Posterior and plantar calcaneal spurring.  IMPRESSION: Soft tissue swelling about the ankle.  No acute osseous abnormality.   Electronically Signed   By: Annia Belt M.D.   On: 01/18/2015 10:37     CBC  Recent Labs Lab 01/12/15 1204 01/13/15 0120  WBC 8.5 8.6  HGB 13.4 12.5*  HCT 41.8 38.3*  PLT 219 204  MCV 91.9 90.8  MCH 29.5 29.6  MCHC 32.1 32.6  RDW 14.1 14.2  LYMPHSABS 1.4  --   MONOABS 0.6  --   EOSABS 0.1  --   BASOSABS 0.0  --     Chemistries   Recent Labs Lab 01/13/15 0120 01/14/15 0344 01/16/15 0435 01/17/15 0351 01/18/15 0533  NA 140 140 140 137 139  K 3.9 4.2 4.5 4.6 4.2  CL 107 106 103 102 103  CO2 GLUCOSE 106* 122* 117* 107* 107*  BUN 24* 26*  CREATININE 1.25 1.29 1.41* 1.33 1.37*  CALCIUM 8.6 9.2 9.0 9.1 9.4  MG  --   --  2.3  --   --   AST 14  --   --   --   --   ALT 13  --   --   --   --   ALKPHOS 50  --   --   --   --   BILITOT 0.6  --   --   --   --    ------------------------------------------------------------------------------------------------------------------ estimated creatinine clearance is 79.1 mL/min (by C-G formula based on Cr of 1.37). ------------------------------------------------------------------------------------------------------------------ No results for input(s): HGBA1C in the last 72 hours. ------------------------------------------------------------------------------------------------------------------ No results for input(s): CHOL, HDL, LDLCALC, TRIG, CHOLHDL, LDLDIRECT in the last 72 hours. ------------------------------------------------------------------------------------------------------------------ No results for input(s): TSH,  T4TOTAL, T3FREE, THYROIDAB in the last 72 hours.  Invalid input(s): FREET3 ------------------------------------------------------------------------------------------------------------------ No results for input(s): VITAMINB12, FOLATE, FERRITIN, TIBC, IRON, RETICCTPCT in the last 72 hours.  Coagulation profile No results for input(s): INR, PROTIME in the last 168 hours.  No results for input(s): DDIMER in the last 72 hours.  Cardiac Enzymes  Recent Labs Lab 01/12/15 1900 01/13/15 0120 01/13/15 0700  TROPONINI 0.03 0.04* <0.03   ------------------------------------------------------------------------------------------------------------------ Invalid input(s): POCBNP     Time Spent in minutes   35   Zaivion Kundrat K M.D on 01/19/2015 at 8:46 AM  Between 7am to 7pm - Pager - [phone]  After 7pm go to [email or web address] - password [facility]  [facility] Office  [phone]

## 2015-01-19 NOTE — H&P (View-Only) (Signed)
Subjective:  Dyspnea better.  For cardioversion later today.  Hopefully home in am.   Objective:  Vital Signs in the last 24 hours: BP 126/84 mmHg  Pulse 90  Temp(Src) 98.3 F (36.8 C) (Oral)  Resp 18  Ht 5' 10" (1.778 m)  Wt 147.21 kg (324 lb 8.6 oz)  BMI 46.57 kg/m2  SpO2 98%  Physical Exam: Extremely large morbidly obese male in no acute distress Lungs: Reduced breath sounds at bases, minimal rales  Cardiac:  Irregular rhythm, normal S1 and S2, no S3 Abdomen:  Soft, nontender, no masses Extremities:  2+ edema present  Intake/Output from previous day: 02/14 0701 - 02/15 0700 In: 1303 [P.O.:1200; I.V.:3] Out: 2500 [Urine:2500] Weight Filed Weights   01/17/15 0440 01/18/15 0526 01/19/15 0646  Weight: 148.145 kg (326 lb 9.6 oz) 147.918 kg (326 lb 1.6 oz) 147.21 kg (324 lb 8.6 oz)    Lab Results: Basic Metabolic Panel:  Recent Labs  01/17/15 0351 01/18/15 0533  NA 137 139  K 4.6 4.2  CL 102 103  CO2 26 24  GLUCOSE 107* 107*  BUN 24* 26*  CREATININE 1.33 1.37*     BNP    Component Value Date/Time   BNP 503.3* 01/12/2015 1204    PROTIME: Lab Results  Component Value Date   INR 0.99 02/01/2014    Telemetry: Atrial fibrillation and flutter with variable response in the low 100s.    Assessment/Plan: 1. Acute on chronic systolic congestive heart failure worsened likely by the development of atrial fibrillation with rapid response, some medical noncompliance, and recent viral infection  Continue diuresis  2. Atrial fibrillation -rate better controlled.  Since he is still in atrial fibrillation plan cardioversion tomorrow.   3. Anticoagulation with XARELTO 4. Morbid obesity 5. Medical noncompliance 6. Cardiomyopathy-type still undetermined.   Recommendations:  Cardioversion today.  W. Spencer Fedora Knisely, Jr.  MD F[facility] Cardiology  01/19/2015, 9:24 AM 

## 2015-01-19 NOTE — Transfer of Care (Signed)
Immediate Anesthesia Transfer of Care Note  Patient: Charles Mckinney  Procedure(s) Performed: Procedure(s) with comments: CARDIOVERSION (N/A) - pt in Afib, 12:22 synched cardioversion @  120 joules using Propofol 100 mg,IV....unsuccessful, repeated at 200 joules  Patient Location: PACU and Endoscopy Unit  Anesthesia Type:MAC  Level of Consciousness: awake, alert , oriented and sedated  Airway & Oxygen Therapy: Patient Spontanous Breathing and Patient connected to nasal cannula oxygen  Post-op Assessment: Report given to RN, Post -op Vital signs reviewed and stable and Patient moving all extremities  Post vital signs: Reviewed and stable  Last Vitals:  Filed Vitals:   01/19/15 1200  BP: 120/80  Pulse: 119  Temp:   Resp: 17    Complications: No apparent anesthesia complications

## 2015-01-19 NOTE — CV Procedure (Signed)
Electrical Cardioversion Procedure Note  Charles Mckinney   64 y.o. male MRN: [id number] DOB: 24-Dec-1950  Today's date: 01/19/2015  Procedure: Electrical Cardioversion  Indications:  Atrial Fibrillation  Time Out: Verified patient identification, verified procedure,medications/allergies/relevent history reviewed, required imaging and test results available.  Performed  Procedure Details  The patient was NPO after midnight. Anesthesia was administered at the beside  by [facility] with 60 mg of lidocaine and 100 mg of propofol.  Cardioversion was done with synchronized biphasic defibrillation with AP pads with 120 and then 200 watts. The patient converted to normal sinus rhythm after the 2nd shock. The patient tolerated the procedure well   IMPRESSION:  Successful cardioversion of atrial fibrillation    W. Viann Fish, Montez Hageman. MD [facility]   01/19/2015, 12:24 PM

## 2015-01-19 NOTE — Progress Notes (Signed)
Subjective:  Dyspnea better.  For cardioversion later today.  Hopefully home in am.   Objective:  Vital Signs in the last 24 hours: BP 126/84 mmHg  Pulse 90  Temp(Src) 98.3 F (36.8 C) (Oral)  Resp 18  Ht 5\' 10"  (1.778 m)  Wt 147.21 kg (324 lb 8.6 oz)  BMI 46.57 kg/m2  SpO2 98%  Physical Exam: Extremely large morbidly obese male in no acute distress Lungs: Reduced breath sounds at bases, minimal rales  Cardiac:  Irregular rhythm, normal S1 and S2, no S3 Abdomen:  Soft, nontender, no masses Extremities:  2+ edema present  Intake/Output from previous day: 02/14 0701 - 02/15 0700 In: 1303 [P.O.:1200; I.V.:3] Out: 2500 [Urine:2500] Weight Filed Weights   01/17/15 0440 01/18/15 0526 01/19/15 0646  Weight: 148.145 kg (326 lb 9.6 oz) 147.918 kg (326 lb 1.6 oz) 147.21 kg (324 lb 8.6 oz)    Lab Results: Basic Metabolic Panel:  Recent Labs  60/04/59 0351 01/18/15 0533  NA 137 139  K 4.6 4.2  CL 102 103  CO2 26 24  GLUCOSE 107* 107*  BUN 24* 26*  CREATININE 1.33 1.37*     BNP    Component Value Date/Time   BNP 503.3* 01/12/2015 1204    PROTIME: Lab Results  Component Value Date   INR 0.99 02/01/2014    Telemetry: Atrial fibrillation and flutter with variable response in the low 100s.    Assessment/Plan: 1. Acute on chronic systolic congestive heart failure worsened likely by the development of atrial fibrillation with rapid response, some medical noncompliance, and recent viral infection  Continue diuresis  2. Atrial fibrillation -rate better controlled.  Since he is still in atrial fibrillation plan cardioversion tomorrow.   3. Anticoagulation with XARELTO 4. Morbid obesity 5. Medical noncompliance 6. Cardiomyopathy-type still undetermined.   Recommendations:  Cardioversion today.  Darden Palmer  MD [facility] Card[facility]  01/19/2015, 9:24 AM

## 2015-01-19 NOTE — Anesthesia Postprocedure Evaluation (Signed)
  Anesthesia Post-op Note  Patient: Charles Mckinney  Procedure(s) Performed: Procedure(s) with comments: CARDIOVERSION (N/A) - pt in Afib, 12:22 synched cardioversion @  120 joules using Propofol 100 mg,IV....unsuccessful, repeated at 200 joules  Patient Location: PACU and Endoscopy Unit  Anesthesia Type:MAC  Level of Consciousness: awake, alert , oriented and sedated  Airway and Oxygen Therapy: Patient Spontanous Breathing and Patient connected to nasal cannula oxygen  Post-op Pain: none  Post-op Assessment: Post-op Vital signs reviewed, [name] Cardiovascular Status Stable, Respiratory Function Stable, Patent Airway and No signs of Nausea or vomiting  Post-op Vital Signs: Reviewed and stable  Last Vitals:  Filed Vitals:   01/19/15 1200  BP: 120/80  Pulse: 119  Temp:   Resp: 17    Complications: No apparent anesthesia complications

## 2015-01-19 NOTE — Interval H&P Note (Signed)
History and Physical Interval Note:  01/19/2015 12:20 PM  Charles Mckinney  has presented today for surgery, with the diagnosis of a fbi  The various methods of treatment have been discussed with the patient and family. After consideration of risks, benefits and other options for treatment, the patient has consented to  cardioversion .  The patient's history has been reviewed, patient examined, no change in status, stable for surgery.  I have reviewed the patient's chart and labs.  Questions were answered to the patient's satisfaction.     Charles Mckinney,Charles Mckinney

## 2015-01-20 ENCOUNTER — Encounter (HOSPITAL_COMMUNITY): Payer: Self-pay | Admitting: Cardiology

## 2015-01-20 DIAGNOSIS — I5023 Acute on chronic systolic (congestive) heart failure: Secondary | ICD-10-CM

## 2015-01-20 LAB — CBC
HEMATOCRIT: 41.5 % (ref 39.0–52.0)
Hemoglobin: 13.2 g/dL (ref 13.0–17.0)
MCH: 28.6 pg (ref 26.0–34.0)
MCHC: 31.8 g/dL (ref 30.0–36.0)
MCV: 89.8 fL (ref 78.0–100.0)
Platelets: 221 10*3/uL (ref 150–400)
RBC: 4.62 MIL/uL (ref 4.22–5.81)
RDW: 14 % (ref 11.5–15.5)
WBC: 7.8 10*3/uL (ref 4.0–10.5)

## 2015-01-20 LAB — BASIC METABOLIC PANEL
Anion gap: 12 (ref 5–15)
BUN: 26 mg/dL — ABNORMAL HIGH (ref 6–23)
CALCIUM: 8.9 mg/dL (ref 8.4–10.5)
CHLORIDE: 102 mmol/L (ref 96–112)
CO2: 23 mmol/L (ref 19–32)
CREATININE: 1.4 mg/dL — AB (ref 0.50–1.35)
GFR calc Af Amer: 60 mL/min — ABNORMAL LOW (ref >90)
GFR, EST NON AFRICAN AMERICAN: 52 mL/min — AB (ref >90)
Glucose, Bld: 96 mg/dL (ref 70–99)
POTASSIUM: 3.7 mmol/L (ref 3.5–5.1)
Sodium: 137 mmol/L (ref 135–145)

## 2015-01-20 MED ORDER — AMIODARONE HCL 200 MG PO TABS
200.0000 mg | ORAL_TABLET | Freq: Two times a day (BID) | ORAL | Status: DC
  Filled 2015-01-20: qty 1

## 2015-01-20 MED ORDER — CARVEDILOL 12.5 MG PO TABS: 12.5000 mg | ORAL_TABLET | Freq: Two times a day (BID) | ORAL | Status: DC

## 2015-01-20 MED ORDER — AMIODARONE HCL 200 MG PO TABS: 200.0000 mg | ORAL_TABLET | Freq: Two times a day (BID) | ORAL | Status: DC

## 2015-01-20 MED ORDER — COLCHICINE 0.6 MG PO TABS: 0.6000 mg | ORAL_TABLET | Freq: Every day | ORAL | Status: DC

## 2015-01-20 MED ORDER — LISINOPRIL 2.5 MG PO TABS: 2.5000 mg | ORAL_TABLET | Freq: Every day | ORAL | Status: DC

## 2015-01-20 NOTE — Progress Notes (Signed)
Subjective:  Feels much better following cardioversion yesterday.  Able to sleep well last night.  Breathing much better.  Weight now down to 322.  Objective:  Vital Signs in the last 24 hours: BP 117/65 mmHg  Pulse 84  Temp(Src) 97.9 F (36.6 C) (Oral)  Resp 20  Ht 5\' 10"  (1.778 m)  Wt 146.5 kg (322 lb 15.6 oz)  BMI 46.34 kg/m2  SpO2 95%  Physical Exam: Extremely large morbidly obese male in no acute distress Lungs: Reduced breath sounds at bases, minimal rales  Cardiac: Regular rhythm, normal S1 and S2, no S3 Abdomen:  Soft, nontender, no masses Extremities:  Trace edema present  Intake/Output from previous day: 02/15 0701 - 02/16 0700 In: 800 [P.O.:800] Out: 2450 [Urine:2450] Weight Filed Weights   01/18/15 0526 01/19/15 0646 01/20/15 0629  Weight: 147.918 kg (326 lb 1.6 oz) 147.21 kg (324 lb 8.6 oz) 146.5 kg (322 lb 15.6 oz)    Lab Results: Basic Metabolic Panel:  Recent Labs  68/03/21 0533 01/20/15 0334  NA 139 137  K 4.2 3.7  CL 103 102  CO2 24 23  GLUCOSE 107* 96  BUN 26* 26*  CREATININE 1.37* 1.40*     BNP    Component Value Date/Time   BNP 503.3* 01/12/2015 1204    PROTIME: Lab Results  Component Value Date   INR 0.99 02/01/2014    Telemetry: Sinus rhythm since cardioversion   Assessment/Plan: 1. Acute on chronic systolic congestive heart failure worsened likely by the development of atrial fibrillation with rapid response, some medical noncompliance, and recent viral infection  He is much better at the present time and I think can be discharged home today.  2. Atrial fibrillation -maintaining sinus rhythm following cardioversion on amiodarone 3. Anticoagulation with XARELTO 4. Morbid obesity 5. Medical noncompliance 6. Cardiomyopathy-type still undetermined. 7.  Worsening renal function relatively stable.   Recommendations:  I think that he is able to go home at the present time.  Need to watch his renal function closely  since he is on XARELTO.  Might be worthwhile changing him to Eliquis on discharge but depends upon whether the [place] will cover this.  We talked about the importance of weighing him every day.  He should go home on furosemide 40 mg twice daily, carvedilol 12.5 mg twice daily, lisinopril 2.5 mg daily, pravastatin, and amiodarone 200 mg twice daily.  He should continue on systemic anticoagulation.  I would like to see him in one to 2 weeks in follow-up.  [facility]  We also talked about the importance of carbohydrate restriction and weight loss.  Charles Palmer  MD [facility]  01/20/2015, 9:24 AM

## 2015-01-20 NOTE — Progress Notes (Signed)
DC IV, DC Tele, DC Home. Discharge instructions and home medications discussed with patient. Patient denied any questions or concerns at this time. Patient leaving unit via wheelchair and appears in no acute distress.  

## 2015-01-20 NOTE — Progress Notes (Signed)
     Charles Mckinney was admitted to the Hospital on 01/12/2015 and Discharged  01/20/2015 and should be excused from work/school   for 12  days starting 01/12/2015 , may return to work/school without any restrictions.  Call Susa Raring MD, [facility] [phone] with questions.  Leroy Sea M.D on 01/20/2015,at 12:25 PM  [facility]   Office  [phone]

## 2015-01-20 NOTE — Discharge Summary (Signed)
Charles Mckinney, is a 64 y.o. male  DOB 11/10/1951  MRN [id number].  Admission date:  01/12/2015  Admitting Physician  Jerald Kief, MD  Discharge Date:  01/20/2015   Primary MD  Default, Provider, MD  Recommendations for primary care physician for things to follow:   Monitor weight, BMP closely. Must follow with cardiologist within a week   Admission Diagnosis  Acute systolic heart failure [I50.21] Atrial fibrillation with RVR [I48.91]   Discharge Diagnosis  Acute systolic heart failure [I50.21] Atrial fibrillation with RVR [I48.91]    Principal Problem:   Acute on chronic systolic congestive heart failure Active Problems:   Hyperlipidemia   Morbid obesity   Hypertensive heart disease   GERD   Paroxysmal atrial fibrillation   Cardiomyopathy of undetermined type   Long-term (current) use of anticoagulants      Past Medical History  Diagnosis Date  . Hyperlipidemia   . Hypertensive heart disease   . Obesity (BMI 30-39.9)   . GERD (gastroesophageal reflux disease)   . Kidney stones   . Osteoarthritis   . A-fib   . CHF (congestive heart failure)   . PONV (postoperative nausea and vomiting)     Past Surgical History  Procedure Laterality Date  . Cataract extraction    . Cardioversion N/A 01/19/2015    Procedure: CARDIOVERSION;  Surgeon: Othella Boyer, MD;  Location: [facility] ENDOSCOPY;  Service: Cardiovascular;  Laterality: N/A;  pt in Afib, 12:22 synched cardioversion @  120 joules using Propofol 100 mg,IV....unsuccessful, repeated at 200 joules       History of present illness and  Hospital Course:     Kindly see H&P for history of present illness and admission details, please review complete Labs, Consult reports and Test reports for all details in brief  HPI  from the history and physical  done on the day of admission  Charles Mckinney is a 64 y.o. male With a hx of afib, HLD, CHF, HTN who presents with sob, increased orthopnea. In the ED, pt was found to be in afib with RVR with HR into the 140's. The patient was noted to have BNP of 503 with CXR findings suggestive of mild CHF. The patient was initially continued on cardizem gtt, since weaned off. The patient was given one dose of IV lasix. Hospitalist consulted for consideration for admission.  Of note, pt reports recent URI type symptoms several days to onset of symptoms. No sick contacts. No fevers. Of note, pt has not been taking xarelto because phlebotomist at [facility] told pt that xarelto was "dangerous." No recent bleeding.  Hospital Course    Shortness of breath with acute respiratory failure caused by acute on chronic combined systolic and diastolic heart failure EF 35%. Improved with diuresis cardioversion for A. fib, now in sinus and close to being symptom-free, will place him back on his home dose oral Lasix , along with salt and fluid restriction. On Coreg & ACE inhibitor. Seen by Dr. Donnie Aho this admission case discussed with  him cleared for home discharge.  Filed Weights   01/18/15 0526 01/19/15 0646 01/20/15 0629  Weight: 147.918 kg (326 lb 1.6 oz) 147.21 kg (324 lb 8.6 oz) 146.5 kg (322 lb 15.6 oz)       2. A. fib with RVR. [place] Vasc 2 score - 4. He is on xaralto since 12/22/2014, cardiology saw the patient and cardioverted him with good effect he is presently in sinus, this should help with his diastolic CHF also, continue Coreg at present dose, was started on amiodarone this solution and will be sent home on it should follow with cardiology closely.   3. Essential hypertension. On moderate dose Coreg along with low-dose Ace. Stable.   4. Dyslipidemia. On home dose statin.   5. CAD. Chest pain-free. On aspirin and Coreg and statin for secondary prevention.   6. Mild ARF. Lasix dose reduced to home dose  now, a slight creatinine is 1.3, currently 1.4 request PCP to monitor BMP and diuretic dose closely.   7. Left ankle pain. Mild gout. Stable x-ray, elevated uric acid level, placed on low-dose Colchicine , better        Discharge Condition: Stable   Follow UP  Follow-up Information    Follow up with TILLEY JR,W SPENCER, MD In 1 week.   Specialty:  Cardiology   Contact information:   [address] [place] [address] [place] [address] [phone]       Follow up with Your PCP at [facility]. Schedule an appointment as soon as possible for a visit in 3 days.   Why:  BMP check        Discharge Instructions  and  Discharge Medications      Discharge Instructions    Discharge instructions    Complete by:  As directed   Follow with Primary MD  in 7 days   Get CBC, CMP, 2 view Chest X ray checked  by Primary MD next visit.    Activity: As tolerated with Full fall precautions use walker/cane & assistance as needed   Disposition Home     Diet: Heart Healthy   Check your Weight same time everyday, if you gain over 2 pounds, or you develop in leg swelling, experience more shortness of breath or chest pain, call your Primary MD immediately. Follow Cardiac Low Salt Diet and 1.5 lit/day fluid restriction.   On your next visit with your primary care physician please Get Medicines reviewed and adjusted.   Please request your Prim.MD to go over all Hospital Tests and Procedure/Radiological results at the follow up, please get all Hospital records sent to your Prim MD by signing hospital release before you go home.   If you experience worsening of your admission symptoms, develop shortness of breath, life threatening emergency, suicidal or homicidal thoughts you must seek medical attention immediately by calling 911 or calling your MD immediately  if symptoms less severe.  You Must read complete instructions/literature along with all the possible adverse reactions/side effects for all  the Medicines you take and that have been prescribed to you. Take any new Medicines after you have completely understood and accpet all the possible adverse reactions/side effects.   Do not drive, operating heavy machinery, perform activities at heights, swimming or participation in water activities or provide baby sitting services if your were admitted for syncope or siezures until you have seen by Primary MD or a Neurologist and advised to do so again.  Do not drive when taking Pain medications.  Do not take more than prescribed Pain, Sleep and Anxiety Medications  Special Instructions: If you have smoked or chewed Tobacco  in the last 2 yrs please stop smoking, stop any regular Alcohol  and or any Recreational drug use.  Wear Seat belts while driving.   Please note  You were cared for by a hospitalist during your hospital stay. If you have any questions about your discharge medications or the care you received while you were in the hospital after you are discharged, you can call the unit and asked to speak with the hospitalist on call if the hospitalist that took care of you is not available. Once you are discharged, your primary care physician will handle any further medical issues. Please note that NO REFILLS for any discharge medications will be authorized once you are discharged, as it is imperative that you return to your primary care physician (or establish a relationship with a primary care physician if you do not have one) for your aftercare needs so that they can reassess your need for medications and monitor your lab values.     Increase activity slowly    Complete by:  As directed             Medication List    TAKE these medications        amiodarone 200 MG tablet  Commonly known as:  PACERONE  Take 1 tablet (200 mg total) by mouth 2 (two) times daily.     aspirin 81 MG EC tablet  Take 1 tablet (81 mg total) by mouth daily. Swallow whole.     carvedilol 12.5 MG  tablet  Commonly known as:  COREG  Take 1 tablet (12.5 mg total) by mouth 2 (two) times daily with a meal.     cholecalciferol 1000 UNITS tablet  Commonly known as:  VITAMIN D  Take 1,000 Units by mouth daily.     colchicine 0.6 MG tablet  Take 1 tablet (0.6 mg total) by mouth daily.     FLUVIRIN Susp  Generic drug:  Influenza Vac Typ A&B Surf Ant  Inject 0.5 mLs as directed once.     furosemide 40 MG tablet  Commonly known as:  LASIX  Take 1 tablet (40 mg total) by mouth 2 (two) times daily.     lisinopril 2.5 MG tablet  Commonly known as:  PRINIVIL,ZESTRIL  Take 1 tablet (2.5 mg total) by mouth daily.     nitroGLYCERIN 0.4 MG SL tablet  Commonly known as:  NITROSTAT  Place 1 tablet (0.4 mg total) under the tongue every 5 (five) minutes as needed for chest pain.     potassium chloride SA 20 MEQ tablet  Commonly known as:  K-DUR,KLOR-CON  Take 2 tablets (40 mEq total) by mouth daily.     pravastatin 40 MG tablet  Commonly known as:  PRAVACHOL  Take 1 tablet (40 mg total) by mouth daily.     rivaroxaban 20 MG Tabs tablet  Commonly known as:  XARELTO  Take 1 tablet (20 mg total) by mouth daily with supper.          Diet and Activity recommendation: See Discharge Instructions above   Consults obtained - Cardiology   Major procedures and Radiology Reports - PLEASE review detailed and final reports for all details, in brief -   Cardioversion   TTE  - Left ventricle: Dificult study. With contrast, it appears that the EF is in the 35% range. The cavity size was  mildly dilated. Wall thickness was increased in a pattern of mild LVH. Theestimated ejection fraction was 35%. Diffuse hypokinesis. - Left atrium: The atrium was moderately dilated. - Right ventricle: The cavity size was mildly dilated. Systolicfunction was mildly reduced.  Dg Chest 2 View  01/12/2015   CLINICAL DATA:  Shortness of breath  EXAM: CHEST  2 VIEW  COMPARISON:  12/22/2014  FINDINGS:  Chronic cardiomegaly and vascular pedicle widening. There is diffuse interstitial opacity with small bilateral pleural effusions, similar to prior. Mild atelectasis is suspected at the bases.  IMPRESSION: Mild CHF.   Electronically Signed   By: Tiburcio Pea M.D.   On: 01/12/2015 13:11   Dg Chest 2 View  12/22/2014   CLINICAL DATA:  Shortness of breath.  Chest pain.  EXAM: CHEST  2 VIEW  COMPARISON:  None.  FINDINGS: Mediastinum and hilar structures normal. Cardiomegaly with pulmonary vascular prominence and bilateral interstitial prominence. Small pleural effusion. No pneumothorax. Degenerative changes thoracic spine.  IMPRESSION: Congestive heart failure with mild interstitial edema.   Electronically Signed   By: Maisie Fus  Register   On: 12/22/2014 14:07   Dg Ankle Complete Left  01/18/2015   CLINICAL DATA:  Patient with lateral ankle pain this morning upon standing. Inability to bear weight. No new injury.  EXAM: LEFT ANKLE COMPLETE - 3+ VIEW  COMPARISON:  None.  FINDINGS: Marked soft tissue swelling about the ankle joint. Normal anatomic alignment. The talar dome is intact. No evidence for acute fracture or dislocation. Posterior and plantar calcaneal spurring.  IMPRESSION: Soft tissue swelling about the ankle.  No acute osseous abnormality.   Electronically Signed   By: Annia Belt M.D.   On: 01/18/2015 10:37    Micro Results      Recent Results (from the past 240 hour(s))  MRSA PCR Screening     Status: None   Collection Time: 01/12/15 11:11 PM  Result Value Ref Range Status   MRSA by PCR NEGATIVE NEGATIVE Final    Comment:        The GeneXpert MRSA Assay (FDA approved for NASAL specimens only), is one component of a comprehensive MRSA colonization surveillance program. It is not intended to diagnose MRSA infection nor to guide or monitor treatment for MRSA infections.        Today   Subjective:   Charles Mckinney today has no headache,no chest abdominal pain,no new weakness  tingling or numbness, feels much better wants to go home today.    Objective:   Blood pressure 117/65, pulse 84, temperature 97.9 F (36.6 C), temperature source Oral, resp. rate 20, height 5\' 10"  (1.778 m), weight 146.5 kg (322 lb 15.6 oz), SpO2 95 %.   Intake/Output Summary (Last 24 hours) at 01/20/15 1007 Last data filed at 01/20/15 0940  Gross per 24 hour  Intake    960 ml  Output   2250 ml  Net  -1290 ml    Exam Awake Alert, Oriented x 3, No new F.N deficits, Normal affect [place].AT,PERRAL Supple Neck,No JVD, No cervical lymphadenopathy appriciated.  Symmetrical Chest wall movement, Good air movement bilaterally, CTAB RRR,No Gallops,Rubs or new Murmurs, No Parasternal Heave +ve B.Sounds, Abd Soft, Non tender, No organomegaly appriciated, No rebound -guarding or rigidity. No Cyanosis, Clubbing , trace edema, No new Rash or bruise  Data Review   CBC w Diff: Lab Results  Component Value Date   WBC 7.8 01/20/2015   HGB 13.2 01/20/2015   HCT 41.5 01/20/2015   PLT 221 01/20/2015  LYMPHOPCT 16 01/12/2015   MONOPCT 7 01/12/2015   EOSPCT 1 01/12/2015   BASOPCT 1 01/12/2015    CMP: Lab Results  Component Value Date   NA 137 01/20/2015   K 3.7 01/20/2015   CL 102 01/20/2015   CO2 23 01/20/2015   BUN 26* 01/20/2015   CREATININE 1.40* 01/20/2015   PROT 5.9* 01/13/2015   ALBUMIN 3.3* 01/13/2015   BILITOT 0.6 01/13/2015   ALKPHOS 50 01/13/2015   AST 14 01/13/2015   ALT 13 01/13/2015  .   Total Time in preparing paper work, data evaluation and todays exam - 35 minutes  Leroy Sea M.D on 01/20/2015 at 10:07 AM  [facility] Office  [phone]

## 2015-03-31 ENCOUNTER — Emergency Department (HOSPITAL_COMMUNITY): Payer: Non-veteran care

## 2015-03-31 ENCOUNTER — Inpatient Hospital Stay (HOSPITAL_COMMUNITY)
Admission: EM | Admit: 2015-03-31 | Discharge: 2015-04-02 | DRG: 292 | Disposition: A | Discharge status: Home / Self Care | Payer: Non-veteran care | Attending: Cardiovascular Disease | Admitting: Cardiovascular Disease

## 2015-03-31 ENCOUNTER — Encounter (HOSPITAL_COMMUNITY): Payer: Self-pay | Admitting: Emergency Medicine

## 2015-03-31 DIAGNOSIS — I5023 Acute on chronic systolic (congestive) heart failure: Secondary | ICD-10-CM | POA: Diagnosis present

## 2015-03-31 DIAGNOSIS — R0602 Shortness of breath: Secondary | ICD-10-CM

## 2015-03-31 DIAGNOSIS — E785 Hyperlipidemia, unspecified: Secondary | ICD-10-CM | POA: Diagnosis present

## 2015-03-31 DIAGNOSIS — I110 Hypertensive heart disease with heart failure: Secondary | ICD-10-CM | POA: Diagnosis present

## 2015-03-31 DIAGNOSIS — I5021 Acute systolic (congestive) heart failure: Secondary | ICD-10-CM | POA: Diagnosis present

## 2015-03-31 DIAGNOSIS — I482 Chronic atrial fibrillation: Secondary | ICD-10-CM | POA: Diagnosis present

## 2015-03-31 DIAGNOSIS — Z6841 Body Mass Index (BMI) 40.0 and over, adult: Secondary | ICD-10-CM | POA: Diagnosis not present

## 2015-03-31 DIAGNOSIS — R079 Chest pain, unspecified: Secondary | ICD-10-CM

## 2015-03-31 DIAGNOSIS — Z87442 Personal history of urinary calculi: Secondary | ICD-10-CM

## 2015-03-31 DIAGNOSIS — I480 Paroxysmal atrial fibrillation: Secondary | ICD-10-CM

## 2015-03-31 DIAGNOSIS — E876 Hypokalemia: Secondary | ICD-10-CM | POA: Diagnosis present

## 2015-03-31 DIAGNOSIS — K219 Gastro-esophageal reflux disease without esophagitis: Secondary | ICD-10-CM | POA: Diagnosis present

## 2015-03-31 DIAGNOSIS — Z9849 Cataract extraction status, unspecified eye: Secondary | ICD-10-CM

## 2015-03-31 DIAGNOSIS — I509 Heart failure, unspecified: Secondary | ICD-10-CM

## 2015-03-31 DIAGNOSIS — Z880 Allergy status to penicillin: Secondary | ICD-10-CM

## 2015-03-31 DIAGNOSIS — F419 Anxiety disorder, unspecified: Secondary | ICD-10-CM | POA: Diagnosis present

## 2015-03-31 DIAGNOSIS — M1990 Unspecified osteoarthritis, unspecified site: Secondary | ICD-10-CM | POA: Diagnosis present

## 2015-03-31 DIAGNOSIS — Z7982 Long term (current) use of aspirin: Secondary | ICD-10-CM | POA: Diagnosis not present

## 2015-03-31 DIAGNOSIS — Z87891 Personal history of nicotine dependence: Secondary | ICD-10-CM

## 2015-03-31 DIAGNOSIS — I252 Old myocardial infarction: Secondary | ICD-10-CM

## 2015-03-31 HISTORY — DX: Anxiety disorder, unspecified: F41.9

## 2015-03-31 LAB — BASIC METABOLIC PANEL
Anion gap: 12 (ref 5–15)
BUN: 23 mg/dL (ref 6–23)
CALCIUM: 9.1 mg/dL (ref 8.4–10.5)
CHLORIDE: 102 mmol/L (ref 96–112)
CO2: 24 mmol/L (ref 19–32)
Creatinine, Ser: 1.29 mg/dL (ref 0.50–1.35)
GFR calc Af Amer: 66 mL/min — ABNORMAL LOW (ref >90)
GFR, EST NON AFRICAN AMERICAN: 57 mL/min — AB (ref >90)
Glucose, Bld: 104 mg/dL — ABNORMAL HIGH (ref 70–99)
POTASSIUM: 3.9 mmol/L (ref 3.5–5.1)
Sodium: 138 mmol/L (ref 135–145)

## 2015-03-31 LAB — CBC
HCT: 41.7 % (ref 39.0–52.0)
Hemoglobin: 13.5 g/dL (ref 13.0–17.0)
MCH: 28.8 pg (ref 26.0–34.0)
MCHC: 32.4 g/dL (ref 30.0–36.0)
MCV: 88.9 fL (ref 78.0–100.0)
PLATELETS: 190 10*3/uL (ref 150–400)
RBC: 4.69 MIL/uL (ref 4.22–5.81)
RDW: 14.6 % (ref 11.5–15.5)
WBC: 7.7 10*3/uL (ref 4.0–10.5)

## 2015-03-31 LAB — I-STAT TROPONIN, ED: TROPONIN I, POC: 0.01 ng/mL (ref 0.00–0.08)

## 2015-03-31 LAB — BRAIN NATRIURETIC PEPTIDE: B Natriuretic Peptide: 362.5 pg/mL — ABNORMAL HIGH (ref 0.0–100.0)

## 2015-03-31 MED ORDER — POTASSIUM CHLORIDE CRYS ER 20 MEQ PO TBCR
40.0000 meq | EXTENDED_RELEASE_TABLET | Freq: Every day | ORAL | Status: DC
  Administered 2015-04-01 – 2015-04-02 (×2): 40 meq via ORAL
  Filled 2015-03-31 (×3): qty 2

## 2015-03-31 MED ORDER — AMIODARONE HCL 200 MG PO TABS
200.0000 mg | ORAL_TABLET | Freq: Two times a day (BID) | ORAL | Status: DC
  Administered 2015-04-01 – 2015-04-02 (×3): 200 mg via ORAL
  Filled 2015-03-31 (×5): qty 1

## 2015-03-31 MED ORDER — CARVEDILOL 12.5 MG PO TABS
12.5000 mg | ORAL_TABLET | Freq: Two times a day (BID) | ORAL | Status: DC
  Administered 2015-04-01 – 2015-04-02 (×3): 12.5 mg via ORAL
  Filled 2015-03-31 (×5): qty 1

## 2015-03-31 MED ORDER — COLCHICINE 0.6 MG PO TABS
0.6000 mg | ORAL_TABLET | Freq: Every day | ORAL | Status: DC
  Administered 2015-04-01 – 2015-04-02 (×2): 0.6 mg via ORAL
  Filled 2015-03-31 (×2): qty 1

## 2015-03-31 MED ORDER — RIVAROXABAN 20 MG PO TABS
20.0000 mg | ORAL_TABLET | Freq: Every day | ORAL | Status: DC
  Administered 2015-04-01 – 2015-04-02 (×2): 20 mg via ORAL
  Filled 2015-03-31 (×2): qty 1

## 2015-03-31 MED ORDER — ACETAMINOPHEN 500 MG PO TABS
1000.0000 mg | ORAL_TABLET | Freq: Four times a day (QID) | ORAL | Status: DC | PRN
  Administered 2015-04-02: 1000 mg via ORAL
  Filled 2015-03-31: qty 2

## 2015-03-31 MED ORDER — FUROSEMIDE 10 MG/ML IJ SOLN
40.0000 mg | INTRAMUSCULAR | Status: AC
  Administered 2015-03-31: 40 mg via INTRAVENOUS
  Filled 2015-03-31: qty 4

## 2015-03-31 MED ORDER — NITROGLYCERIN 2 % TD OINT
0.5000 [in_us] | TOPICAL_OINTMENT | Freq: Once | TRANSDERMAL | Status: AC
  Administered 2015-03-31: 0.5 [in_us] via TOPICAL
  Filled 2015-03-31: qty 1

## 2015-03-31 MED ORDER — VITAMIN D3 25 MCG (1000 UNIT) PO TABS
1000.0000 [IU] | ORAL_TABLET | Freq: Every day | ORAL | Status: DC
  Administered 2015-04-01 – 2015-04-02 (×2): 1000 [IU] via ORAL
  Filled 2015-03-31 (×2): qty 1

## 2015-03-31 MED ORDER — LISINOPRIL 2.5 MG PO TABS
2.5000 mg | ORAL_TABLET | Freq: Every day | ORAL | Status: DC
  Administered 2015-04-02: 2.5 mg via ORAL
  Filled 2015-03-31 (×2): qty 1

## 2015-03-31 MED ORDER — PRAVASTATIN SODIUM 40 MG PO TABS
40.0000 mg | ORAL_TABLET | Freq: Every day | ORAL | Status: DC
  Administered 2015-04-01 – 2015-04-02 (×2): 40 mg via ORAL
  Filled 2015-03-31 (×2): qty 1

## 2015-03-31 NOTE — H&P (Signed)
Referring Physician:  [name] is an 64 y.o. male.                       Chief Complaint: Chest pain and shortness of breath.  HPI: 64 year old male with past medical history significant for chronic A. fib on xarelto, hyperlipidemia, GERD, congestive heart failure, presenting to the ED for shortness of breath and chest tightness lasting for 40 minutes that resolved with 2 SL NTG intake. Patient has increased shortness of breath for the past several months along with exertional dyspnea. He states he has chronic nighttime orthopnea and sleeps upright in a recliner. He has pitting edema, he denies any change from his baseline. He is not currently oxygen dependent. Patient states he's been compliant with all his medications. He was seen by his PCP at the [place] last week and prescribed an inhaler for his intermittent shortness of breath, but states it has not arrived in the mail yet. He denies any recent cough, fever, sweats, or chills. Patient in AFIB on arrival. He had recent cardioversion last month. He is waiting for referral to cardiologist at [facility].  Past Medical History  Diagnosis Date  . Hyperlipidemia   . Hypertensive heart disease   . Obesity (BMI 30-39.9)   . GERD (gastroesophageal reflux disease)   . Kidney stones   . Osteoarthritis   . A-fib   . CHF (congestive heart failure)   . PONV (postoperative nausea and vomiting)       Past Surgical History  Procedure Laterality Date  . Cataract extraction    . Cardioversion N/A 01/19/2015    Procedure: CARDIOVERSION;  Surgeon: Jacolyn Reedy, MD;  Location: [facility] ENDOSCOPY;  Service: Cardiovascular;  Laterality: N/A;  pt in Afib, 12:22 synched cardioversion @  120 joules using Propofol 100 mg,IV....unsuccessful, repeated at 200 joules    Family History  Problem Relation Age of Onset  . Diabetes Mother   . Heart disease Father   . Stroke Brother   . Heart disease Brother   . Diabetes Brother    Social History:  reports that he  has quit smoking. He has never used smokeless tobacco. He reports that he does not drink alcohol or use illicit drugs.  Allergies:  Allergies  Allergen Reactions  . Niacin And Related Other (See Comments)    FLUSHING  . Penicillins Itching and Rash     (Not in a hospital admission)  Results for orders placed or performed during the hospital encounter of 03/31/15 (from the past 48 hour(s))  CBC     Status: None   Collection Time: 03/31/15  7:51 PM  Result Value Ref Range   WBC 7.7 4.0 - 10.5 K/uL   RBC 4.69 4.22 - 5.81 MIL/uL   Hemoglobin 13.5 13.0 - 17.0 g/dL   HCT 41.7 39.0 - 52.0 %   MCV 88.9 78.0 - 100.0 fL   MCH 28.8 26.0 - 34.0 pg   MCHC 32.4 30.0 - 36.0 g/dL   RDW 14.6 11.5 - 15.5 %   Platelets 190 150 - 400 K/uL  Basic metabolic panel     Status: Abnormal   Collection Time: 03/31/15  7:51 PM  Result Value Ref Range   Sodium 138 135 - 145 mmol/L   Potassium 3.9 3.5 - 5.1 mmol/L   Chloride 102 96 - 112 mmol/L   CO2 24 19 - 32 mmol/L   Glucose, Bld 104 (H) 70 - 99 mg/dL   BUN 23  6 - 23 mg/dL   Creatinine, Ser 1.29 0.50 - 1.35 mg/dL   Calcium 9.1 8.4 - 10.5 mg/dL   GFR calc non Af Amer 57 (L) >90 mL/min   GFR calc Af Amer 66 (L) >90 mL/min    Comment: (NOTE) The eGFR has been calculated using the CKD EPI equation. This calculation has not been validated in all clinical situations. eGFR's persistently <90 mL/min signify possible Chronic Kidney Disease.    Anion gap 12 5 - 15  BNP (order ONLY if patient complains of dyspnea/SOB AND you have documented it for THIS visit)     Status: Abnormal   Collection Time: 03/31/15  7:51 PM  Result Value Ref Range   B Natriuretic Peptide 362.5 (H) 0.0 - 100.0 pg/mL  I-stat troponin, ED (not at [facility])     Status: None   Collection Time: 03/31/15  8:06 PM  Result Value Ref Range   Troponin i, poc 0.01 0.00 - 0.08 ng/mL   Comment 3            Comment: Due to the release kinetics of cTnI, a negative result within the first  hours of the onset of symptoms does not rule out myocardial infarction with certainty. If myocardial infarction is still suspected, repeat the test at appropriate intervals.    Dg Chest 2 View  03/31/2015   CLINICAL DATA:  64 year old male with shortness of breath and chest tightness  EXAM: CHEST  2 VIEW  COMPARISON:  Prior chest x-ray 01/12/2015  FINDINGS: Stable cardiomegaly. Pulmonary vascular congestion with mild interstitial edema. Small bilateral layering pleural effusions with associated bibasilar atelectasis. No evidence of pneumothorax or focal airspace consolidation. No acute osseous abnormality.  IMPRESSION: 1. Findings most consistent with moderate CHF. 2. Small bilateral layering pleural effusions and associated bibasilar atelectasis.   Electronically Signed   By: Jacqulynn Cadet M.D.   On: 03/31/2015 21:14    Review Of Systems General: No chills, fever, night sweats or weight changes.  Cardiovascular: Positive chest pain, dyspnea on exertion, edema, orthopnea, palpitations, paroxysmal nocturnal dyspnea. Dermatological: No rash, lesions/masses Respiratory: No cough, dyspnea Urologic: No hematuria, dysuria Abdominal: No nausea, vomiting, diarrhea, bright red blood per rectum, melena, or hematemesis Neurologic: No visual changes, wkns, changes in mental status.  Blood pressure 123/75, pulse 86, temperature 97.7 F (36.5 C), temperature source Oral, resp. rate 23, height '5\' 10"'  (1.778 m), weight 153.316 kg (338 lb), SpO2 95 %. General: Pleasant, obese and in respiratory distress Psych: Normal affect. Neuro: Alert and oriented X 3. Moves all extremities spontaneously. HEENT: Normocephalic, atraumatic, Hazel eyes, Conj-pink, Sclera-non-icteric. Neck: Supple without bruits. + JVD. Lungs: Bibasilar crackles. Heart: Irregularly irregular rhythm. II/VI systolic murmur. + s3. Abdomen: Soft, non-tender, non-distended, BS + x 4.  Extremities: No clubbing or cyanosis.  2 + bilateral LEE edema. DP/PT/Radials 2+ and equal bilaterally.  Assessment/Plan Acute on chronic left heart systolic failure. Chest pain r/o MI Atrial fibrillation, recurrent. Morbid obesity Hyperlipidemia GERD  Admit/IV lasix/Home medications. Offer right and left heart catheterization.  Birdie Riddle, MD  03/31/2015, 11:36 PM

## 2015-03-31 NOTE — ED Provider Notes (Signed)
Patient developed chest tightness 2 PM today upon awakening from a nap which lasted 40 minutes went away after treatment with one sublingual nitroglycerin. He is presently asymptomatic except for mild dyspnea which he feels is his baseline. Noted to be in atrial fibrillation. He reports having been cardioverted recently for atrial fibrillation. On exam no respiratory distress lungs clear auscultation heart irregularly irregular abdomen obese nontender extremities with 1+ pretibial pitting edema Medical decision making in light of patient's needing nitroglycerin tonight which she has not used for several months and atrial fibrillation will consult for hospitalization  Doug Sou, MD 04/01/15 [id number]

## 2015-03-31 NOTE — ED Notes (Signed)
Pt. reports progressing SOB/exertional dyspnea  with chest tightness onset 2 pm today , occasional dry cough , pt. took 2 NTG sl prior to arrival with relief.

## 2015-03-31 NOTE — ED Provider Notes (Signed)
CSN: [id number]     Arrival date & time 03/31/15  1905 History   First MD Initiated Contact with Patient 03/31/15 2133     Chief Complaint  Patient presents with  . Shortness of Breath     (Consider location/radiation/quality/duration/timing/severity/associated sxs/prior Treatment) Patient is a 64 y.o. male presenting with shortness of breath. The history is provided by the patient and medical records.  Shortness of Breath Associated symptoms: chest pain     64 year old male with past medical history significant for chronic A. fib on xarelto, hyperlipidemia, GERD, congestive heart failure, presenting to the ED for shortness of breath.  Patient states his been having increased shortness of breath for the past several months along with exertional dyspnea.  He states he has chronic nighttime orthopnea and sleeps upright in a recliner. He has pitting edema, he denies any change from his baseline. He is not currently oxygen dependent. He states earlier today he had an episode of chest tightness which lasted 40 minutes. He took 2 sublingual nitroglycerin prior to arrival with complete resolution of his chest pain. Patient states he's been compliant with all his medications. He was seen by his PCP at the [place] last week and prescribed an inhaler for his intermittent shortness of breath, but states it has not arrived in the mail yet. He denies any recent cough, fever, sweats, or chills.  Patient in AFIB on arrival, recent cardioversion last month.  Patient seen by unassigned cards while hospitalized but did not follow-up OP with anyone.  States he wants to see cardiologist at [facility], waiting for referral.  Past Medical History  Diagnosis Date  . Hyperlipidemia   . Hypertensive heart disease   . Obesity (BMI 30-39.9)   . GERD (gastroesophageal reflux disease)   . Kidney stones   . Osteoarthritis   . A-fib   . CHF (congestive heart failure)   . PONV (postoperative nausea and vomiting)    Past Surgical  History  Procedure Laterality Date  . Cataract extraction    . Cardioversion N/A 01/19/2015    Procedure: CARDIOVERSION;  Surgeon: Othella Boyer, MD;  Location: [facility] ENDOSCOPY;  Service: Cardiovascular;  Laterality: N/A;  pt in Afib, 12:22 synched cardioversion @  120 joules using Propofol 100 mg,IV....unsuccessful, repeated at 200 joules   Family History  Problem Relation Age of Onset  . Diabetes Mother   . Heart disease Father   . Stroke Brother   . Heart disease Brother   . Diabetes Brother    History  Substance Use Topics  . Smoking status: Former Games developer  . Smokeless tobacco: Never Used  . Alcohol Use: No    Review of Systems  Respiratory: Positive for shortness of breath.   Cardiovascular: Positive for chest pain.  All other systems reviewed and are negative.     Allergies  Penicillins  Home Medications   Prior to Admission medications   Medication Sig Start Date End Date Taking? Authorizing Provider  amiodarone (PACERONE) 200 MG tablet Take 1 tablet (200 mg total) by mouth 2 (two) times daily. 01/20/15   Leroy Sea, MD  aspirin 81 MG EC tablet Take 1 tablet (81 mg total) by mouth daily. Swallow whole. 09/03/12   [name], MD  carvedilol (COREG) 12.5 MG tablet Take 1 tablet (12.5 mg total) by mouth 2 (two) times daily with a meal. 01/20/15   Leroy Sea, MD  cholecalciferol (VITAMIN D) 1000 UNITS tablet Take 1,000 Units by mouth daily.  Historical Provider, MD  colchicine 0.6 MG tablet Take 1 tablet (0.6 mg total) by mouth daily. 01/20/15   Leroy Sea, MD  FLUVIRIN SUSP Inject 0.5 mLs as directed once. 11/03/14   Historical Provider, MD  furosemide (LASIX) 40 MG tablet Take 1 tablet (40 mg total) by mouth 2 (two) times daily. 12/22/14   Brittainy Sherlynn Carbon, PA-C  lisinopril (PRINIVIL,ZESTRIL) 2.5 MG tablet Take 1 tablet (2.5 mg total) by mouth daily. 01/20/15   Leroy Sea, MD  nitroGLYCERIN (NITROSTAT) 0.4 MG SL tablet Place 1 tablet (0.4  mg total) under the tongue every 5 (five) minutes as needed for chest pain. 12/22/14   Brittainy Sherlynn Carbon, PA-C  potassium chloride SA (K-DUR,KLOR-CON) 20 MEQ tablet Take 2 tablets (40 mEq total) by mouth daily. 12/22/14   Brittainy Sherlynn Carbon, PA-C  pravastatin (PRAVACHOL) 40 MG tablet Take 1 tablet (40 mg total) by mouth daily. 12/22/14   Brittainy Sherlynn Carbon, PA-C  rivaroxaban (XARELTO) 20 MG TABS tablet Take 1 tablet (20 mg total) by mouth daily with supper. 12/22/14   Brittainy M Simmons, PA-C   BP 111/81 mmHg  Pulse 102  Temp(Src) 97.7 F (36.5 C) (Oral)  Ht  (1.778 m)  Wt 338 lb (153.316 kg)  BMI 48.50 kg/m2  SpO2 98%   Physical Exam  Constitutional: He is oriented to person, place, and time. He appears well-developed and well-nourished. No distress.  Morbidly obese  HENT:  Head: Normocephalic and atraumatic.  Mouth/Throat: Oropharynx is clear and moist.  Eyes: Conjunctivae and EOM are normal. Pupils are equal, round, and reactive to light.  Neck: Normal range of motion. Neck supple.  Cardiovascular: Normal rate and normal heart sounds.  An irregularly irregular rhythm present.  AFIB, rate 90's -120's  Pulmonary/Chest: Effort normal and breath sounds normal. No respiratory distress. He has no wheezes.  Abdominal: Soft. Bowel sounds are normal. There is no tenderness. There is no guarding.  Musculoskeletal: Normal range of motion. He exhibits no edema.  2+ pitting edema BLE  Neurological: He is alert and oriented to person, place, and time.  Skin: Skin is warm and dry. He is not diaphoretic.  Psychiatric: He has a normal mood and affect.  Nursing note and vitals reviewed.   ED Course  Procedures (including critical care time) Labs Review Labs Reviewed  BASIC METABOLIC PANEL - Abnormal; Notable for the following:    Glucose, Bld 104 (*)    GFR calc non Af Amer 57 (*)    GFR calc Af Amer 66 (*)    All other components within normal limits  BRAIN NATRIURETIC PEPTIDE -  Abnormal; Notable for the following:    B Natriuretic Peptide 362.5 (*)    All other components within normal limits  CBC  I-STAT TROPOININ, ED    Imaging Review Dg Chest 2 View  03/31/2015   CLINICAL DATA:  64 year old male with shortness of breath and chest tightness  EXAM: CHEST  2 VIEW  COMPARISON:  Prior chest x-ray 01/12/2015  FINDINGS: Stable cardiomegaly. Pulmonary vascular congestion with mild interstitial edema. Small bilateral layering pleural effusions with associated bibasilar atelectasis. No evidence of pneumothorax or focal airspace consolidation. No acute osseous abnormality.  IMPRESSION: 1. Findings most consistent with moderate CHF. 2. Small bilateral layering pleural effusions and associated bibasilar atelectasis.   Electronically Signed   By: Malachy Moan M.D.   On: 03/31/2015 21:14     EKG Interpretation   Date/Time:  Tuesday March 31 2015 19:40:27  EDT Ventricular Rate:  98 PR Interval:    QRS Duration: 92 QT Interval:  414 QTC Calculation: 528 R Axis:   -43 Text Interpretation:  Atrial fibrillation Left axis deviation Incomplete  right bundle branch block Septal infarct , age undetermined Prolonged QT  Abnormal ECG last tracing showed nsr, otherwise unchanged Confirmed by  Ethelda Chick  MD, SAM [phone]) on 03/31/2015 9:54:21 PM      MDM   Final diagnoses:  CHF (congestive heart failure)  SOB (shortness of breath)   64 year old male with exertional dyspnea and shortness of breath over the past several months. He had episode of chest pain today which lasted approximate 40 minutes, relieved with nitroglycerin. He is currently pain-free. On exam, patient is in no acute respiratory distress. His O2 sats are stable on room air, no current oxygen requirement. He is in A. fib with rate varying between low 90s to 120s. He was cardioverted last month while hospitalized.  Lab work as above, troponin negative. BNP elevated at 365 which is lower than his prior admission.  His chest x-ray has evidence of vascular congestion and mild pulmonary edema. [name] O2 sats remain stable on room air. Given his ongoing issues, recurrence of A. fib, and lack of outpatient follow-up-- feel patient would benefit from admission for IV diuresis and close monitoring.  He was given dose of lasix in ED.  Case discussed with on call cardiology, Dr. Kallie Edward, who evaluated patient in the ED and will admit for further management.  Garlon Hatchet, PA-C 03/31/15 9480  Doug Sou, MD 04/01/15 [phone]

## 2015-04-01 ENCOUNTER — Encounter (HOSPITAL_COMMUNITY): Payer: Self-pay | Admitting: General Practice

## 2015-04-01 LAB — TROPONIN I
Troponin I: <0.03 ng/mL (ref <0.031)
Troponin I: <0.03 ng/mL (ref <0.031)

## 2015-04-01 MED ORDER — SODIUM CHLORIDE 0.9 % IJ SOLN
3.0000 mL | Freq: Two times a day (BID) | INTRAMUSCULAR | Status: DC
  Administered 2015-04-01 – 2015-04-02 (×2): 3 mL via INTRAVENOUS

## 2015-04-01 MED ORDER — FUROSEMIDE 10 MG/ML IJ SOLN
40.0000 mg | Freq: Two times a day (BID) | INTRAMUSCULAR | Status: DC
  Administered 2015-04-01 – 2015-04-02 (×4): 40 mg via INTRAVENOUS
  Filled 2015-04-01 (×7): qty 4

## 2015-04-01 MED ORDER — SODIUM CHLORIDE 0.9 % IJ SOLN: 3.0000 mL | INTRAMUSCULAR | Status: DC | PRN

## 2015-04-01 MED ORDER — SODIUM CHLORIDE 0.9 % IV SOLN: 250.0000 mL | INTRAVENOUS | Status: DC | PRN

## 2015-04-01 MED ORDER — ACETAMINOPHEN 325 MG PO TABS: 650.0000 mg | ORAL_TABLET | ORAL | Status: DC | PRN

## 2015-04-01 MED ORDER — HEPARIN SODIUM (PORCINE) 5000 UNIT/ML IJ SOLN: 5000.0000 [IU] | Freq: Three times a day (TID) | INTRAMUSCULAR | Status: DC

## 2015-04-01 MED ORDER — ONDANSETRON HCL 4 MG/2ML IJ SOLN
4.0000 mg | Freq: Four times a day (QID) | INTRAMUSCULAR | Status: DC | PRN
  Administered 2015-04-02: 4 mg via INTRAVENOUS
  Filled 2015-04-01: qty 2

## 2015-04-01 MED ORDER — PERFLUTREN LIPID MICROSPHERE
1.0000 mL | INTRAVENOUS | Status: AC | PRN
  Filled 2015-04-01: qty 10

## 2015-04-01 NOTE — Progress Notes (Signed)
Ref: Default, Provider, MD   Subjective:  Breathing better. Some anxiety. Afebrile. On Xarelto for over few months.  Objective:  Vital Signs in the last 24 hours: Temp:  [97.4 F (36.3 C)-98.2 F (36.8 C)] 98.2 F (36.8 C) (04/27 1027) Pulse Rate:  [56-109] 109 (04/27 1731) Cardiac Rhythm:  [-] Atrial fibrillation (04/27 0745) Resp:  [19-25] 20 (04/27 1027) BP: (92-148)/(46-97) 148/84 mmHg (04/27 1731) SpO2:  [93 %-98 %] 94 % (04/27 1054) FiO2 (%):  [0 %] 0 % (04/26 1939) Weight:  [151.592 kg (334 lb 3.2 oz)-153.826 kg (339 lb 2 oz)] 151.592 kg (334 lb 3.2 oz) (04/27 0106)  Physical Exam: BP Readings from Last 1 Encounters:  04/01/15 148/84    Wt Readings from Last 1 Encounters:  04/01/15 151.592 kg (334 lb 3.2 oz)    Weight change:   HEENT: [place]/AT, Eyes- PERL, EOMI, Conjunctiva-Pink, Sclera-Non-icteric Neck: No JVD, No bruit, Trachea midline. Lungs:  Clearing, Bilateral. Cardiac:  Regular rhythm, normal S1 and S2, no S3.  Abdomen:  Soft, non-tender. Extremities:  1 + edema present. No cyanosis. No clubbing. CNS: AxOx3, Cranial nerves grossly intact, moves all 4 extremities. Right handed. Skin: Warm and dry.   Intake/Output from previous day: 04/26 0701 - 04/27 0700 In: -  Out: 2550 [Urine:2550]    Lab Results: BMET    Component Value Date/Time   NA 138 03/31/2015 1951   NA 137 01/20/2015 0334   NA 139 01/18/2015 0533   K 3.9 03/31/2015 1951   K 3.7 01/20/2015 0334   K 4.2 01/18/2015 0533   CL 102 03/31/2015 1951   CL 102 01/20/2015 0334   CL 103 01/18/2015 0533   CO2 24 03/31/2015 1951   CO2 23 01/20/2015 0334   CO2 24 01/18/2015 0533   GLUCOSE 104* 03/31/2015 1951   GLUCOSE 96 01/20/2015 0334   GLUCOSE 107* 01/18/2015 0533   GLUCOSE 99 09/12/2006 1309   BUN 23 03/31/2015 1951   BUN 26* 01/20/2015 0334   BUN 26* 01/18/2015 0533   CREATININE 1.29 03/31/2015 1951   CREATININE 1.40* 01/20/2015 0334   CREATININE 1.37* 01/18/2015 0533   CALCIUM 9.1  03/31/2015 1951   CALCIUM 8.9 01/20/2015 0334   CALCIUM 9.4 01/18/2015 0533   GFRNONAA 57* 03/31/2015 1951   GFRNONAA 52* 01/20/2015 0334   GFRNONAA 53* 01/18/2015 0533   GFRAA 66* 03/31/2015 1951   GFRAA 60* 01/20/2015 0334   GFRAA 61* 01/18/2015 0533   CBC    Component Value Date/Time   WBC 7.7 03/31/2015 1951   RBC 4.69 03/31/2015 1951   HGB 13.5 03/31/2015 1951   HCT 41.7 03/31/2015 1951   PLT 190 03/31/2015 1951   MCV 88.9 03/31/2015 1951   MCH 28.8 03/31/2015 1951   MCHC 32.4 03/31/2015 1951   RDW 14.6 03/31/2015 1951   LYMPHSABS 1.4 01/12/2015 1204   MONOABS 0.6 01/12/2015 1204   EOSABS 0.1 01/12/2015 1204   BASOSABS 0.0 01/12/2015 1204   HEPATIC Function Panel  Recent Labs  01/13/15 0120  PROT 5.9*   HEMOGLOBIN A1C No components found for: HGA1C,  MPG CARDIAC ENZYMES Lab Results  Component Value Date   TROPONINI <0.03 04/01/2015   TROPONINI <0.03 04/01/2015   TROPONINI <0.03 04/01/2015   BNP No results for input(s): PROBNP in the last 8760 hours. TSH  Recent Labs  01/14/15 1140  TSH 3.727   CHOLESTEROL No results for input(s): CHOL in the last 8760 hours.  Scheduled Meds: . amiodarone  200 mg  Oral BID  . carvedilol  12.5 mg Oral BID WC  . cholecalciferol  1,000 Units Oral Daily  . colchicine  0.6 mg Oral Daily  . furosemide  40 mg Intravenous Q12H  . lisinopril  2.5 mg Oral Daily  . potassium chloride SA  40 mEq Oral Daily  . pravastatin  40 mg Oral q1800  . rivaroxaban  20 mg Oral Q supper  . sodium chloride  3 mL Intravenous Q12H  . sodium chloride  3 mL Intravenous Q12H   Continuous Infusions:  PRN Meds:.sodium chloride, acetaminophen, acetaminophen, ondansetron (ZOFRAN) IV, sodium chloride, sodium chloride  Assessment/Plan: Acute on chronic left heart systolic failure. Chest pain r/o MI Atrial fibrillation, recurrent. Morbid obesity Hyperlipidemia GERD  Cardioversion tomorrow. Continue diuresis.     LOS: 1 day    Orpah Cobb  MD  04/01/2015, 6:34 PM

## 2015-04-01 NOTE — Care Management Note (Unsigned)
    Page 1 of 1   04/01/2015     2:01:57 PM CARE MANAGEMENT NOTE 04/01/2015  Patient:  Charles Mckinney, Charles Mckinney   Account Number:  [id number]  Date Initiated:  04/01/2015  Documentation initiated by:  Aunisty Reali  Subjective/Objective Assessment:   Pt adm on 03/31/15 with CHF exacerbation.  PTA, pt resides at home alone.     Action/Plan:   Will follow for discharge planning as pt progresses.   Anticipated DC Date:  04/04/2015   Anticipated DC Plan:  HOME W HOME HEALTH SERVICES      DC Planning Services  CM consult      Choice offered to / List presented to:             Status of service:  In process, will continue to follow Medicare Important Message given?   (If response is "NO", the following Medicare IM given date fields will be blank) Date Medicare IM given:   Medicare IM given by:   Date Additional Medicare IM given:   Additional Medicare IM given by:    Discharge Disposition:    Per UR Regulation:  Reviewed for med. necessity/level of care/duration of stay  If discussed at Long Length of Stay Meetings, dates discussed:    Comments:

## 2015-04-01 NOTE — Progress Notes (Signed)
Echocardiogram 2D Echocardiogram has been performed.  Charles Mckinney 04/01/2015, 2:44 PM

## 2015-04-02 ENCOUNTER — Encounter (HOSPITAL_COMMUNITY)
Admission: EM | Disposition: A | Discharge status: Home / Self Care | Payer: Self-pay | Source: Home / Self Care | Attending: Cardiovascular Disease

## 2015-04-02 ENCOUNTER — Inpatient Hospital Stay (HOSPITAL_COMMUNITY): Payer: Non-veteran care | Admitting: Certified Registered Nurse Anesthetist

## 2015-04-02 HISTORY — PX: CARDIOVERSION: SHX1299

## 2015-04-02 LAB — BASIC METABOLIC PANEL
Anion gap: 12 (ref 5–15)
BUN: 18 mg/dL (ref 6–23)
CALCIUM: 9.2 mg/dL (ref 8.4–10.5)
CHLORIDE: 99 mmol/L (ref 96–112)
CO2: 27 mmol/L (ref 19–32)
Creatinine, Ser: 1.36 mg/dL — ABNORMAL HIGH (ref 0.50–1.35)
GFR calc Af Amer: 62 mL/min — ABNORMAL LOW (ref >90)
GFR, EST NON AFRICAN AMERICAN: 53 mL/min — AB (ref >90)
GLUCOSE: 110 mg/dL — AB (ref 70–99)
POTASSIUM: 3.4 mmol/L — AB (ref 3.5–5.1)
SODIUM: 138 mmol/L (ref 135–145)

## 2015-04-02 SURGERY — CARDIOVERSION
Anesthesia: Monitor Anesthesia Care

## 2015-04-02 MED ORDER — METOPROLOL TARTRATE 1 MG/ML IV SOLN
5.0000 mg | Freq: Once | INTRAVENOUS | Status: AC
Start: 2015-04-02 — End: 2015-04-02
  Administered 2015-04-02: 5 mg via INTRAVENOUS
  Filled 2015-04-02: qty 5

## 2015-04-02 MED ORDER — METOPROLOL TARTRATE 1 MG/ML IV SOLN
INTRAVENOUS | Status: AC
  Filled 2015-04-02: qty 5

## 2015-04-02 MED ORDER — SODIUM CHLORIDE 0.9 % IV SOLN: INTRAVENOUS | Status: DC

## 2015-04-02 MED ORDER — METOPROLOL TARTRATE 1 MG/ML IV SOLN
INTRAVENOUS | Status: DC | PRN
  Administered 2015-04-02: 5 mg via INTRAVENOUS

## 2015-04-02 MED ORDER — PROPOFOL 10 MG/ML IV BOLUS
INTRAVENOUS | Status: DC | PRN
  Administered 2015-04-02: 100 mg via INTRAVENOUS

## 2015-04-02 MED ORDER — SODIUM CHLORIDE 0.9 % IV SOLN
INTRAVENOUS | Status: DC | PRN
  Administered 2015-04-02: 12:00:00 via INTRAVENOUS

## 2015-04-02 MED ORDER — POTASSIUM CHLORIDE ER 10 MEQ PO TBCR
20.0000 meq | EXTENDED_RELEASE_TABLET | Freq: Once | ORAL | Status: AC
  Administered 2015-04-02: 20 meq via ORAL
  Filled 2015-04-02: qty 2

## 2015-04-02 MED ORDER — LIDOCAINE HCL (CARDIAC) 20 MG/ML IV SOLN
INTRAVENOUS | Status: DC | PRN
Start: 2015-04-02 — End: 2015-04-02
  Administered 2015-04-02: 80 mg via INTRAVENOUS

## 2015-04-02 NOTE — CV Procedure (Signed)
PRE-OP DIAGNOSIS:  Atrial fibrillation.  POST-OP DIAGNOSIS:  Atrial fibrillation.  OPERATOR:  Orpah Cobb.     ANESTHESIA:  IV Propofol.  COMPLICATIONS:  None.   OPERATIVE TERM:  DC cardioversion.  The nature of the procedure, risks and alternatives were discussed with the patient who gave informed consent.  OPERATIVE TECHNIQUE:  The patient was sedated with 100 mg Propofol following 80 mg. of lidocaine.  When the patient was no longer responsive to quiet voice, DC cardioversion was performed with 120, 150 and 200 J biphasically and synchronously.  After 3rd shock he converted to sinus rhythm.  The patient was then monitored until fully alert and left the procedure area in stable condition.  IMPRESSION:  Successful DC cardioversion.

## 2015-04-02 NOTE — Anesthesia Preprocedure Evaluation (Addendum)
Anesthesia Evaluation  Patient identified by MRN, date of birth, ID band Patient awake    Reviewed: Allergy & Precautions, NPO status , [name] Chart, lab work & pertinent test results, reviewed documented beta blocker date and time   History of Anesthesia Complications (+) PONV and history of anesthetic complications  Airway Mallampati: II  TM Distance: >3 FB Neck ROM: Full    Dental   Pulmonary shortness of breath and with exertion, former smoker,  Sleeps sitting breath sounds clear to auscultation        Cardiovascular hypertension, + Past MI and +CHF + dysrhythmias Atrial Fibrillation Rhythm:Irregular Rate:Normal  EF 35% 01/2015, CHF improving   Neuro/Psych Anxiety    GI/Hepatic Neg liver ROS, GERD-  Medicated,  Endo/Other    Renal/GU Renal InsufficiencyRenal diseaseGFR 60     Musculoskeletal   Abdominal (+) + obese,   Peds  Hematology   Anesthesia Other Findings   Reproductive/Obstetrics                            Anesthesia Physical  Anesthesia Plan  ASA: IV  Anesthesia Plan: MAC   Post-op Pain Management:    Induction: Intravenous  Airway Management Planned: Mask  Additional Equipment:   Intra-op Plan:   Post-operative Plan:   Informed Consent: I have reviewed the patients History and Physical, chart, labs and discussed the procedure including the risks, benefits and alternatives for the proposed anesthesia with the patient or authorized representative who has indicated his/her understanding and acceptance.     Plan Discussed with: CRNA and Surgeon  Anesthesia Plan Comments:        Anesthesia Quick Evaluation

## 2015-04-02 NOTE — Clinical Documentation Improvement (Signed)
Please specify type and acuity of CHF Possible Clinical Conditions?  Chronic Systolic Congestive Heart Failure Chronic Diastolic Congestive Heart Failure Chronic Systolic & Diastolic Congestive Heart Failure Acute Systolic Congestive Heart Failure Acute Diastolic Congestive Heart Failure Acute Systolic & Diastolic Congestive Heart Failure Acute on Chronic Systolic Congestive Heart Failure Acute on Chronic Diastolic Congestive Heart Failure Acute on Chronic Systolic & Diastolic Congestive Heart Failure Other Condition_ Cannot Clinically Determine  Supporting Information: (As per notes) "64 year old male with past medical history significant for chronic A. fib on xarelto, hyperlipidemia, GERD, congestive heart failure, presenting to the ED for shortness of breath."  Risk Factors: "A-fib, Morbid Obesity" Signs & Symptoms: "2+ pitting edema BLE" Diagnostics:03-31-15 BNP 362.5 (H) Treatment: furosemide (LASIX) injection 40 mg   [[id number]]       Ordered Dose: 40 mg Route: Intravenous Frequency: Every 12 hours   Thank You, Nevin Bloodgood, RN, BSN, CCDS,Clinical Documentation Specialist:  [phone]  [phone]=[facility] [place]- Health Information Management

## 2015-04-02 NOTE — Interval H&P Note (Signed)
History and Physical Interval Note:  04/02/2015 12:32 PM  Charles Mckinney  has presented today for surgery, with the diagnosis of a fib  The various methods of treatment have been discussed with the patient and family. After consideration of risks, benefits and other options for treatment, the patient has consented to  Procedure(s): CARDIOVERSION (N/A) as a surgical intervention .  The patient's history has been reviewed, patient examined, no change in status, stable for surgery.  I have reviewed the patient's chart and labs.  Questions were answered to the patient's satisfaction.     Ravinder Hofland S

## 2015-04-02 NOTE — H&P (View-Only) (Signed)
Ref: Default, Provider, MD   Subjective:  Breathing better. Some anxiety. Afebrile. On Xarelto for over few months.  Objective:  Vital Signs in the last 24 hours: Temp:  [97.4 F (36.3 C)-98.2 F (36.8 C)] 98.2 F (36.8 C) (04/27 1027) Pulse Rate:  [56-109] 109 (04/27 1731) Cardiac Rhythm:  [-] Atrial fibrillation (04/27 0745) Resp:  [19-25] 20 (04/27 1027) BP: (92-148)/(46-97) 148/84 mmHg (04/27 1731) SpO2:  [93 %-98 %] 94 % (04/27 1054) FiO2 (%):  [0 %] 0 % (04/26 1939) Weight:  [151.592 kg (334 lb 3.2 oz)-153.826 kg (339 lb 2 oz)] 151.592 kg (334 lb 3.2 oz) (04/27 0106)  Physical Exam: BP Readings from Last 1 Encounters:  04/01/15 148/84    Wt Readings from Last 1 Encounters:  04/01/15 151.592 kg (334 lb 3.2 oz)    Weight change:   HEENT: [place]/AT, Eyes- PERL, EOMI, Conjunctiva-Pink, Sclera-Non-icteric Neck: No JVD, No bruit, Trachea midline. Lungs:  Clearing, Bilateral. Cardiac:  Regular rhythm, normal S1 and S2, no S3.  Abdomen:  Soft, non-tender. Extremities:  1 + edema present. No cyanosis. No clubbing. CNS: AxOx3, Cranial nerves grossly intact, moves all 4 extremities. Right handed. Skin: Warm and dry.   Intake/Output from previous day: 04/26 0701 - 04/27 0700 In: -  Out: 2550 [Urine:2550]    Lab Results: BMET    Component Value Date/Time   NA 138 03/31/2015 1951   NA 137 01/20/2015 0334   NA 139 01/18/2015 0533   K 3.9 03/31/2015 1951   K 3.7 01/20/2015 0334   K 4.2 01/18/2015 0533   CL 102 03/31/2015 1951   CL 102 01/20/2015 0334   CL 103 01/18/2015 0533   CO2 24 03/31/2015 1951   CO2 23 01/20/2015 0334   CO2 24 01/18/2015 0533   GLUCOSE 104* 03/31/2015 1951   GLUCOSE 96 01/20/2015 0334   GLUCOSE 107* 01/18/2015 0533   GLUCOSE 99 09/12/2006 1309   BUN 23 03/31/2015 1951   BUN 26* 01/20/2015 0334   BUN 26* 01/18/2015 0533   CREATININE 1.29 03/31/2015 1951   CREATININE 1.40* 01/20/2015 0334   CREATININE 1.37* 01/18/2015 0533   CALCIUM 9.1  03/31/2015 1951   CALCIUM 8.9 01/20/2015 0334   CALCIUM 9.4 01/18/2015 0533   GFRNONAA 57* 03/31/2015 1951   GFRNONAA 52* 01/20/2015 0334   GFRNONAA 53* 01/18/2015 0533   GFRAA 66* 03/31/2015 1951   GFRAA 60* 01/20/2015 0334   GFRAA 61* 01/18/2015 0533   CBC    Component Value Date/Time   WBC 7.7 03/31/2015 1951   RBC 4.69 03/31/2015 1951   HGB 13.5 03/31/2015 1951   HCT 41.7 03/31/2015 1951   PLT 190 03/31/2015 1951   MCV 88.9 03/31/2015 1951   MCH 28.8 03/31/2015 1951   MCHC 32.4 03/31/2015 1951   RDW 14.6 03/31/2015 1951   LYMPHSABS 1.4 01/12/2015 1204   MONOABS 0.6 01/12/2015 1204   EOSABS 0.1 01/12/2015 1204   BASOSABS 0.0 01/12/2015 1204   HEPATIC Function Panel  Recent Labs  01/13/15 0120  PROT 5.9*   HEMOGLOBIN A1C No components found for: HGA1C,  MPG CARDIAC ENZYMES Lab Results  Component Value Date   TROPONINI <0.03 04/01/2015   TROPONINI <0.03 04/01/2015   TROPONINI <0.03 04/01/2015   BNP No results for input(s): PROBNP in the last 8760 hours. TSH  Recent Labs  01/14/15 1140  TSH 3.727   CHOLESTEROL No results for input(s): CHOL in the last 8760 hours.  Scheduled Meds: . amiodarone  200 mg   Oral BID  . carvedilol  12.5 mg Oral BID WC  . cholecalciferol  1,000 Units Oral Daily  . colchicine  0.6 mg Oral Daily  . furosemide  40 mg Intravenous Q12H  . lisinopril  2.5 mg Oral Daily  . potassium chloride SA  40 mEq Oral Daily  . pravastatin  40 mg Oral q1800  . rivaroxaban  20 mg Oral Q supper  . sodium chloride  3 mL Intravenous Q12H  . sodium chloride  3 mL Intravenous Q12H   Continuous Infusions:  PRN Meds:.sodium chloride, acetaminophen, acetaminophen, ondansetron (ZOFRAN) IV, sodium chloride, sodium chloride  Assessment/Plan: Acute on chronic left heart systolic failure. Chest pain r/o MI Atrial fibrillation, recurrent. Morbid obesity Hyperlipidemia GERD  Cardioversion tomorrow. Continue diuresis.     LOS: 1 day    Jaye Saal  MD  04/01/2015, 6:34 PM      

## 2015-04-02 NOTE — Progress Notes (Signed)
Discontinued IV; Discontinued telemetry; Discharged pt to home.  Explained all discharge instructions to pt, and he had no further questions at this time.  Pt in no acute distress.

## 2015-04-02 NOTE — Discharge Summary (Signed)
Physician Discharge Summary  Patient ID: JOH DROGE MRN: [id number] DOB/AGE: 1951-07-16 64 y.o.  Admit date: 03/31/2015 Discharge date: 04/02/2015  Admission Diagnoses: Acute on chronic left heart systolic failure. Chest pain r/o MI Atrial fibrillation, recurrent. Morbid obesity Hyperlipidemia GERD  Discharge Diagnoses:  Principle Problem: * Acute on chronic left heart systolic failure.* Chest pain r/o MI Atrial fibrillation, recurrent. Morbid obesity Hyperlipidemia GERD Hypokalemia  Discharged Condition: fair  Hospital Course: 64 year old male with past medical history significant for chronic A. fib on xarelto, hyperlipidemia, GERD, congestive heart failure, presented to the ED for shortness of breath and chest tightness lasting for 40 minutes that resolved with 2 SL NTG intake. Patient has increased shortness of breath for the past several months along with exertional dyspnea. He stated he has chronic nighttime orthopnea and sleeps upright in a recliner. He has pitting edema, he denies any change from his baseline. He improved with good diuresis with IV lasix use. He underwent successful cardioversion to sinus rhythm. He understood to decrease  Salt and water intake. He was offered right and left heart catheterization but he declined and feels [facility]. He was advised to follow with me or [facility] on regular basis.  Consults: cardiology  Significant Diagnostic Studies: labs: Normal CBC and BMET. Moderately elevated BNP to 362.5 pg/mL. Normal troponin-I. EKG-Atrial fibrillation to Sinus rhythm with 1st degree AV block post cardioversion. Chest x-ray: Moderate CHF with small bilateral pleural effusion and bibasilar atelectasis.  Treatments: cardiac meds: lisinopril (Prinivil), carvedilol, furosemide, potassium, amiodarone and Xarelto (Rivaroxaban).  Discharge Exam: Blood pressure 105/63, pulse 85, temperature 98.2 F (36.8 C), temperature source Oral,  resp. rate 18, height 5\' 10"  (1.778 m), weight 148.372 kg (327 lb 1.6 oz), SpO2 92 %. General: Pleasant, obese and in respiratory distress Psych: Normal affect. Neuro: Alert and oriented X 3. Moves all extremities spontaneously. HEENT: Normocephalic, atraumatic, Hazel eyes, Conj-pink, Sclera-non-icteric. Neck: Supple without bruits. No JVD. Lungs: Clear, bil. Heart: Regular rhythm. II/VI systolic murmur.  Abdomen: Soft, non-tender, non-distended, BS + x 4.  Extremities: No clubbing or cyanosis. Trace + bilateral LEE edema. DP/PT/Radials 2+ and equal bilaterally.  Disposition: 01-Home or Self Care     Medication List    TAKE these medications        acetaminophen 500 MG tablet  Commonly known as:  TYLENOL  Take 1,000 mg by mouth every 6 (six) hours as needed for moderate pain.     amiodarone 200 MG tablet  Commonly known as:  PACERONE  Take 1 tablet (200 mg total) by mouth 2 (two) times daily.     aspirin 81 MG EC tablet  Take 1 tablet (81 mg total) by mouth daily. Swallow whole.     carvedilol 12.5 MG tablet  Commonly known as:  COREG  Take 1 tablet (12.5 mg total) by mouth 2 (two) times daily with a meal.     cholecalciferol 1000 UNITS tablet  Commonly known as:  VITAMIN D  Take 1,000 Units by mouth daily.     colchicine 0.6 MG tablet  Take 1 tablet (0.6 mg total) by mouth daily.     furosemide 40 MG tablet  Commonly known as:  LASIX  Take 1 tablet (40 mg total) by mouth 2 (two) times daily.     lisinopril 2.5 MG tablet  Commonly known as:  PRINIVIL,ZESTRIL  Take 1 tablet (2.5 mg total) by mouth daily.     nitroGLYCERIN 0.4 MG SL tablet  Commonly known as:  NITROSTAT  Place 1 tablet (0.4 mg total) under the tongue every 5 (five) minutes as needed for chest pain.     potassium chloride SA 20 MEQ tablet  Commonly known as:  K-DUR,KLOR-CON  Take 2 tablets (40 mEq total) by mouth daily.     pravastatin 40 MG tablet  Commonly known as:  PRAVACHOL   Take 1 tablet (40 mg total) by mouth daily.     rivaroxaban 20 MG Tabs tablet  Commonly known as:  XARELTO  Take 1 tablet (20 mg total) by mouth daily with supper.           Follow-up Information    Follow up with [facility], MD. Schedule an appointment as soon as possible for a visit in 2 weeks.   Specialty:  Cardiology   Contact information:   [address] [place] [address] [phone]       Signed: Ricki Rodriguez 04/02/2015, 6:19 PM

## 2015-04-02 NOTE — Progress Notes (Signed)
Notified Dr. [name] that pt's only peripheral IV is occluded, and he refuses NS fluids as ordered.  He also refuses new IV stick.

## 2015-04-02 NOTE — Anesthesia Postprocedure Evaluation (Signed)
Anesthesia Post Note  Patient: Charles Mckinney  Procedure(s) Performed: Procedure(s) (LRB): CARDIOVERSION (N/A)  Anesthesia type: General  Patient location: PACU  Post pain: Pain level controlled  Post assessment: Post-op Vital signs reviewed  Last Vitals: BP 101/65 mmHg  Pulse 74  Temp(Src) 35.8 C (Oral)  Resp 17  Ht 5\' 10"  (1.778 m)  Wt 327 lb 1.6 oz (148.372 kg)  BMI 46.93 kg/m2  SpO2 97%  Post vital signs: Reviewed  Level of consciousness: sedated  Complications: No apparent anesthesia complications

## 2015-04-02 NOTE — Transfer of Care (Signed)
Immediate Anesthesia Transfer of Care Note  Patient: Charles Mckinney  Procedure(s) Performed: Procedure(s): CARDIOVERSION (N/A)  Patient Location: Endoscopy Unit  Anesthesia Type:MAC  Level of Consciousness: sedated  Airway & Oxygen Therapy: Patient Spontanous Breathing and Patient connected to nasal cannula oxygen  Post-op Assessment: Report given to RN, Post -op Vital signs reviewed and stable and Patient moving all extremities  Post vital signs: Reviewed and stable  Last Vitals:  Filed Vitals:   04/02/15 1146  BP: 112/94  Pulse:   Temp: 36.8 C  Resp: 19    Complications: No apparent anesthesia complications

## 2015-04-03 ENCOUNTER — Encounter (HOSPITAL_COMMUNITY): Payer: Self-pay | Admitting: Cardiovascular Disease

## 2015-04-03 MED FILL — Perflutren Lipid Microsphere IV Susp 1.1 MG/ML: INTRAVENOUS | Qty: 10 | Status: AC

## 2015-04-05 ENCOUNTER — Emergency Department (HOSPITAL_COMMUNITY): Payer: Non-veteran care

## 2015-04-05 ENCOUNTER — Encounter (HOSPITAL_COMMUNITY): Payer: Self-pay | Admitting: *Deleted

## 2015-04-05 ENCOUNTER — Inpatient Hospital Stay (HOSPITAL_COMMUNITY)
Admission: EM | Admit: 2015-04-05 | Discharge: 2015-04-22 | DRG: 234 | Disposition: A | Discharge status: Skilled Nursing Facility | Payer: Non-veteran care | Attending: Surgery | Admitting: Surgery

## 2015-04-05 ENCOUNTER — Other Ambulatory Visit (HOSPITAL_COMMUNITY): Payer: Self-pay

## 2015-04-05 ENCOUNTER — Other Ambulatory Visit: Payer: Self-pay

## 2015-04-05 DIAGNOSIS — E662 Morbid (severe) obesity with alveolar hypoventilation: Secondary | ICD-10-CM | POA: Diagnosis present

## 2015-04-05 DIAGNOSIS — K219 Gastro-esophageal reflux disease without esophagitis: Secondary | ICD-10-CM | POA: Diagnosis present

## 2015-04-05 DIAGNOSIS — Z87891 Personal history of nicotine dependence: Secondary | ICD-10-CM | POA: Diagnosis not present

## 2015-04-05 DIAGNOSIS — E860 Dehydration: Secondary | ICD-10-CM | POA: Diagnosis not present

## 2015-04-05 DIAGNOSIS — R0902 Hypoxemia: Secondary | ICD-10-CM | POA: Diagnosis present

## 2015-04-05 DIAGNOSIS — E876 Hypokalemia: Secondary | ICD-10-CM | POA: Diagnosis not present

## 2015-04-05 DIAGNOSIS — I4891 Unspecified atrial fibrillation: Secondary | ICD-10-CM | POA: Diagnosis not present

## 2015-04-05 DIAGNOSIS — J029 Acute pharyngitis, unspecified: Secondary | ICD-10-CM | POA: Diagnosis not present

## 2015-04-05 DIAGNOSIS — I2510 Atherosclerotic heart disease of native coronary artery without angina pectoris: Secondary | ICD-10-CM

## 2015-04-05 DIAGNOSIS — M1990 Unspecified osteoarthritis, unspecified site: Secondary | ICD-10-CM | POA: Diagnosis present

## 2015-04-05 DIAGNOSIS — I110 Hypertensive heart disease with heart failure: Secondary | ICD-10-CM | POA: Diagnosis not present

## 2015-04-05 DIAGNOSIS — F419 Anxiety disorder, unspecified: Secondary | ICD-10-CM | POA: Diagnosis present

## 2015-04-05 DIAGNOSIS — E119 Type 2 diabetes mellitus without complications: Secondary | ICD-10-CM | POA: Diagnosis present

## 2015-04-05 DIAGNOSIS — G4733 Obstructive sleep apnea (adult) (pediatric): Secondary | ICD-10-CM | POA: Diagnosis present

## 2015-04-05 DIAGNOSIS — Z8249 Family history of ischemic heart disease and other diseases of the circulatory system: Secondary | ICD-10-CM | POA: Diagnosis not present

## 2015-04-05 DIAGNOSIS — I25110 Atherosclerotic heart disease of native coronary artery with unstable angina pectoris: Secondary | ICD-10-CM | POA: Diagnosis not present

## 2015-04-05 DIAGNOSIS — T462X5A Adverse effect of other antidysrhythmic drugs, initial encounter: Secondary | ICD-10-CM | POA: Diagnosis not present

## 2015-04-05 DIAGNOSIS — K5900 Constipation, unspecified: Secondary | ICD-10-CM | POA: Diagnosis not present

## 2015-04-05 DIAGNOSIS — D62 Acute posthemorrhagic anemia: Secondary | ICD-10-CM | POA: Diagnosis not present

## 2015-04-05 DIAGNOSIS — J9811 Atelectasis: Secondary | ICD-10-CM | POA: Diagnosis not present

## 2015-04-05 DIAGNOSIS — Q248 Other specified congenital malformations of heart: Secondary | ICD-10-CM | POA: Diagnosis not present

## 2015-04-05 DIAGNOSIS — Z87442 Personal history of urinary calculi: Secondary | ICD-10-CM | POA: Diagnosis not present

## 2015-04-05 DIAGNOSIS — Z6841 Body Mass Index (BMI) 40.0 and over, adult: Secondary | ICD-10-CM | POA: Diagnosis not present

## 2015-04-05 DIAGNOSIS — I509 Heart failure, unspecified: Secondary | ICD-10-CM

## 2015-04-05 DIAGNOSIS — I5022 Chronic systolic (congestive) heart failure: Secondary | ICD-10-CM

## 2015-04-05 DIAGNOSIS — R0602 Shortness of breath: Secondary | ICD-10-CM | POA: Diagnosis present

## 2015-04-05 DIAGNOSIS — M109 Gout, unspecified: Secondary | ICD-10-CM | POA: Diagnosis not present

## 2015-04-05 DIAGNOSIS — E785 Hyperlipidemia, unspecified: Secondary | ICD-10-CM | POA: Diagnosis present

## 2015-04-05 DIAGNOSIS — I255 Ischemic cardiomyopathy: Secondary | ICD-10-CM | POA: Diagnosis present

## 2015-04-05 DIAGNOSIS — I5023 Acute on chronic systolic (congestive) heart failure: Secondary | ICD-10-CM | POA: Diagnosis present

## 2015-04-05 DIAGNOSIS — I4581 Long QT syndrome: Secondary | ICD-10-CM | POA: Diagnosis not present

## 2015-04-05 DIAGNOSIS — Z01810 Encounter for preprocedural cardiovascular examination: Secondary | ICD-10-CM | POA: Diagnosis not present

## 2015-04-05 DIAGNOSIS — Z951 Presence of aortocoronary bypass graft: Secondary | ICD-10-CM

## 2015-04-05 HISTORY — DX: Atherosclerotic heart disease of native coronary artery without angina pectoris: I25.10

## 2015-04-05 LAB — CBC WITH DIFFERENTIAL/PLATELET
BASOS ABS: 0 10*3/uL (ref 0.0–0.1)
BASOS PCT: 0 % (ref 0–1)
EOS ABS: 0.2 10*3/uL (ref 0.0–0.7)
Eosinophils Relative: 2 % (ref 0–5)
HCT: 42.5 % (ref 39.0–52.0)
Hemoglobin: 14 g/dL (ref 13.0–17.0)
Lymphocytes Relative: 20 % (ref 12–46)
Lymphs Abs: 1.7 10*3/uL (ref 0.7–4.0)
MCH: 29.1 pg (ref 26.0–34.0)
MCHC: 32.9 g/dL (ref 30.0–36.0)
MCV: 88.4 fL (ref 78.0–100.0)
Monocytes Absolute: 0.6 10*3/uL (ref 0.1–1.0)
Monocytes Relative: 7 % (ref 3–12)
Neutro Abs: 6.2 10*3/uL (ref 1.7–7.7)
Neutrophils Relative %: 71 % (ref 43–77)
PLATELETS: 201 10*3/uL (ref 150–400)
RBC: 4.81 MIL/uL (ref 4.22–5.81)
RDW: 14.7 % (ref 11.5–15.5)
WBC: 8.6 10*3/uL (ref 4.0–10.5)

## 2015-04-05 LAB — COMPREHENSIVE METABOLIC PANEL
ALK PHOS: 53 U/L (ref 38–126)
ALT: 15 U/L — ABNORMAL LOW (ref 17–63)
ANION GAP: 11 (ref 5–15)
AST: 18 U/L (ref 15–41)
Albumin: 3.4 g/dL — ABNORMAL LOW (ref 3.5–5.0)
BILIRUBIN TOTAL: 0.7 mg/dL (ref 0.3–1.2)
BUN: 24 mg/dL — AB (ref 6–20)
CO2: 29 mmol/L (ref 22–32)
Calcium: 9.3 mg/dL (ref 8.9–10.3)
Chloride: 99 mmol/L — ABNORMAL LOW (ref 101–111)
Creatinine, Ser: 1.55 mg/dL — ABNORMAL HIGH (ref 0.61–1.24)
GFR calc non Af Amer: 46 mL/min — ABNORMAL LOW (ref >60)
GFR, EST AFRICAN AMERICAN: 53 mL/min — AB (ref >60)
GLUCOSE: 158 mg/dL — AB (ref 70–99)
Potassium: 3.5 mmol/L (ref 3.5–5.1)
SODIUM: 139 mmol/L (ref 135–145)
TOTAL PROTEIN: 6.3 g/dL — AB (ref 6.5–8.1)

## 2015-04-05 LAB — CBC
HCT: 47 % (ref 39.0–52.0)
Hemoglobin: 15.5 g/dL (ref 13.0–17.0)
MCH: 29.2 pg (ref 26.0–34.0)
MCHC: 33 g/dL (ref 30.0–36.0)
MCV: 88.7 fL (ref 78.0–100.0)
Platelets: 258 10*3/uL (ref 150–400)
RBC: 5.3 MIL/uL (ref 4.22–5.81)
RDW: 14.7 % (ref 11.5–15.5)
WBC: 12.1 10*3/uL — AB (ref 4.0–10.5)

## 2015-04-05 LAB — BASIC METABOLIC PANEL
ANION GAP: 13 (ref 5–15)
BUN: 23 mg/dL — ABNORMAL HIGH (ref 6–20)
CO2: 25 mmol/L (ref 22–32)
CREATININE: 1.66 mg/dL — AB (ref 0.61–1.24)
Calcium: 9.3 mg/dL (ref 8.9–10.3)
Chloride: 101 mmol/L (ref 101–111)
GFR calc Af Amer: 49 mL/min — ABNORMAL LOW (ref >60)
GFR calc non Af Amer: 42 mL/min — ABNORMAL LOW (ref >60)
Glucose, Bld: 259 mg/dL — ABNORMAL HIGH (ref 70–99)
Potassium: 4.5 mmol/L (ref 3.5–5.1)
SODIUM: 139 mmol/L (ref 135–145)

## 2015-04-05 LAB — I-STAT VENOUS BLOOD GAS, ED
Acid-base deficit: 3 mmol/L — ABNORMAL HIGH (ref 0.0–2.0)
BICARBONATE: 25.1 meq/L — AB (ref 20.0–24.0)
O2 SAT: 82 %
PO2 VEN: 54 mmHg — AB (ref 30.0–45.0)
TCO2: 27 mmol/L (ref 0–100)
pCO2, Ven: 55.5 mmHg — ABNORMAL HIGH (ref 45.0–50.0)
pH, Ven: 7.264 (ref 7.250–7.300)

## 2015-04-05 LAB — APTT: aPTT: 30 seconds (ref 24–37)

## 2015-04-05 LAB — I-STAT CHEM 8, ED
BUN: 27 mg/dL — ABNORMAL HIGH (ref 6–20)
CREATININE: 1.5 mg/dL — AB (ref 0.61–1.24)
Calcium, Ion: 1.15 mmol/L (ref 1.13–1.30)
Chloride: 102 mmol/L (ref 101–111)
GLUCOSE: 264 mg/dL — AB (ref 70–99)
HCT: 50 % (ref 39.0–52.0)
Hemoglobin: 17 g/dL (ref 13.0–17.0)
Potassium: 4.4 mmol/L (ref 3.5–5.1)
Sodium: 139 mmol/L (ref 135–145)
TCO2: 22 mmol/L (ref 0–100)

## 2015-04-05 LAB — I-STAT TROPONIN, ED: TROPONIN I, POC: 0.01 ng/mL (ref 0.00–0.08)

## 2015-04-05 LAB — BRAIN NATRIURETIC PEPTIDE: B Natriuretic Peptide: 793.6 pg/mL — ABNORMAL HIGH (ref 0.0–100.0)

## 2015-04-05 LAB — TSH: TSH: 5.204 u[IU]/mL — ABNORMAL HIGH (ref 0.350–4.500)

## 2015-04-05 LAB — I-STAT CG4 LACTIC ACID, ED: Lactic Acid, Venous: 2.84 mmol/L (ref 0.5–2.0)

## 2015-04-05 LAB — PROTIME-INR
INR: 1.37 (ref 0.00–1.49)
INR: 1.25 (ref 0.00–1.49)
PROTHROMBIN TIME: 17 s — AB (ref 11.6–15.2)
Prothrombin Time: 15.9 seconds — ABNORMAL HIGH (ref 11.6–15.2)

## 2015-04-05 LAB — TROPONIN I
TROPONIN I: 0.03 ng/mL (ref <0.031)
Troponin I: 0.03 ng/mL (ref <0.031)

## 2015-04-05 LAB — MRSA PCR SCREENING: MRSA BY PCR: NEGATIVE

## 2015-04-05 LAB — HEPARIN LEVEL (UNFRACTIONATED): Heparin Unfractionated: 1.21 IU/mL — ABNORMAL HIGH (ref 0.30–0.70)

## 2015-04-05 MED ORDER — METHYLPREDNISOLONE SODIUM SUCC 125 MG IJ SOLR: 125.0000 mg | Freq: Once | INTRAMUSCULAR | Status: DC

## 2015-04-05 MED ORDER — CARVEDILOL 3.125 MG PO TABS
3.1250 mg | ORAL_TABLET | Freq: Two times a day (BID) | ORAL | Status: DC
  Administered 2015-04-05 – 2015-04-12 (×14): 3.125 mg via ORAL
  Filled 2015-04-05 (×15): qty 1

## 2015-04-05 MED ORDER — SODIUM CHLORIDE 0.9 % IJ SOLN
3.0000 mL | Freq: Two times a day (BID) | INTRAMUSCULAR | Status: DC
  Administered 2015-04-05 – 2015-04-12 (×7): 3 mL via INTRAVENOUS

## 2015-04-05 MED ORDER — ASPIRIN 81 MG PO TBEC: 81.0000 mg | DELAYED_RELEASE_TABLET | Freq: Every day | ORAL | Status: DC

## 2015-04-05 MED ORDER — FUROSEMIDE 10 MG/ML IJ SOLN
80.0000 mg | Freq: Once | INTRAMUSCULAR | Status: AC
  Administered 2015-04-05: 80 mg via INTRAVENOUS
  Filled 2015-04-05: qty 8

## 2015-04-05 MED ORDER — ONDANSETRON HCL 4 MG/2ML IJ SOLN: 4.0000 mg | Freq: Four times a day (QID) | INTRAMUSCULAR | Status: DC | PRN

## 2015-04-05 MED ORDER — FUROSEMIDE 10 MG/ML IJ SOLN
40.0000 mg | Freq: Two times a day (BID) | INTRAMUSCULAR | Status: DC
  Administered 2015-04-05 – 2015-04-12 (×13): 40 mg via INTRAVENOUS
  Filled 2015-04-05 (×14): qty 4

## 2015-04-05 MED ORDER — HEPARIN (PORCINE) IN NACL 100-0.45 UNIT/ML-% IJ SOLN
1800.0000 [IU]/h | INTRAMUSCULAR | Status: DC
  Administered 2015-04-06: 1650 [IU]/h via INTRAVENOUS
  Administered 2015-04-07 – 2015-04-09 (×3): 1800 [IU]/h via INTRAVENOUS
  Filled 2015-04-05 (×7): qty 250

## 2015-04-05 MED ORDER — ACETAMINOPHEN 325 MG PO TABS
650.0000 mg | ORAL_TABLET | ORAL | Status: DC | PRN
  Administered 2015-04-09 – 2015-04-11 (×4): 650 mg via ORAL
  Filled 2015-04-05 (×4): qty 2

## 2015-04-05 MED ORDER — NITROGLYCERIN 0.4 MG SL SUBL: 0.4000 mg | SUBLINGUAL_TABLET | SUBLINGUAL | Status: DC | PRN

## 2015-04-05 MED ORDER — NITROGLYCERIN IN D5W 200-5 MCG/ML-% IV SOLN
10.0000 ug/min | INTRAVENOUS | Status: DC
Start: 2015-04-05 — End: 2015-04-13
  Administered 2015-04-05: 50 ug/min via INTRAVENOUS
  Administered 2015-04-10: 10 ug/min via INTRAVENOUS
  Filled 2015-04-05: qty 250

## 2015-04-05 MED ORDER — POTASSIUM CHLORIDE CRYS ER 20 MEQ PO TBCR
40.0000 meq | EXTENDED_RELEASE_TABLET | Freq: Every day | ORAL | Status: DC
  Administered 2015-04-06 – 2015-04-11 (×6): 40 meq via ORAL
  Filled 2015-04-05 (×6): qty 2

## 2015-04-05 MED ORDER — ASPIRIN EC 81 MG PO TBEC
81.0000 mg | DELAYED_RELEASE_TABLET | Freq: Every day | ORAL | Status: DC
  Administered 2015-04-05 – 2015-04-12 (×8): 81 mg via ORAL
  Filled 2015-04-05 (×9): qty 1

## 2015-04-05 MED ORDER — VITAMIN D 1000 UNITS PO TABS
1000.0000 [IU] | ORAL_TABLET | Freq: Every day | ORAL | Status: DC
  Administered 2015-04-06 – 2015-04-12 (×7): 1000 [IU] via ORAL
  Filled 2015-04-05 (×7): qty 1

## 2015-04-05 MED ORDER — SODIUM CHLORIDE 0.9 % IV SOLN: 250.0000 mL | INTRAVENOUS | Status: DC | PRN

## 2015-04-05 MED ORDER — AMIODARONE HCL 200 MG PO TABS
200.0000 mg | ORAL_TABLET | Freq: Two times a day (BID) | ORAL | Status: DC
  Administered 2015-04-05 – 2015-04-06 (×2): 200 mg via ORAL
  Filled 2015-04-05 (×2): qty 1

## 2015-04-05 MED ORDER — FUROSEMIDE 10 MG/ML IJ SOLN: 40.0000 mg | Freq: Once | INTRAMUSCULAR | Status: DC

## 2015-04-05 MED ORDER — SODIUM CHLORIDE 0.9 % IJ SOLN: 3.0000 mL | INTRAMUSCULAR | Status: DC | PRN

## 2015-04-05 MED ORDER — PRAVASTATIN SODIUM 40 MG PO TABS
40.0000 mg | ORAL_TABLET | Freq: Every day | ORAL | Status: DC
  Administered 2015-04-06 – 2015-04-12 (×7): 40 mg via ORAL
  Filled 2015-04-05 (×7): qty 1

## 2015-04-05 NOTE — ED Notes (Signed)
CG4 i-stat of 2.84 reported to Dr.Belfi.

## 2015-04-05 NOTE — Progress Notes (Signed)
Placed pt on bipap. Pt very anxious and kept asking to take it off as he felt it was "smothering him". RT stayed with pt and reassured him not to focus on the mask and to just focus on his breathing. Pt eventually settled down and tolerating bipap. Pt has high leak at this time. RT will continue to monitor.

## 2015-04-05 NOTE — Progress Notes (Signed)
ANTICOAGULATION CONSULT NOTE - Initial Consult  Pharmacy Consult for Xarelto to Heparin Indication: atrial fibrillation  Allergies  Allergen Reactions  . Niacin And Related Other (See Comments)    FLUSHING  . Penicillins Itching and Rash    Patient Measurements: Height: 5\' 10"  (177.8 cm) Weight: (!) 330 lb 0.5 oz (149.7 kg) IBW/kg (Calculated) : 73 Heparin Dosing Weight: 111 kg  Vital Signs: Temp: 97.7 F (36.5 C) (05/01 1539) Temp Source: Oral (05/01 1539) BP: 112/75 mmHg (05/01 1539) Pulse Rate: 77 (05/01 1539)  Labs:  Recent Labs  04/05/15 0711 04/05/15 0721  HGB 15.5 17.0  HCT 47.0 50.0  PLT 258  --   LABPROT 17.0*  --   INR 1.37  --   CREATININE 1.66* 1.50*    Estimated Creatinine Clearance: 73 mL/min (by C-G formula based on Cr of 1.5).   Medical History: Past Medical History  Diagnosis Date  . Hyperlipidemia   . Hypertensive heart disease   . Obesity (BMI 30-39.9)   . GERD (gastroesophageal reflux disease)   . Kidney stones   . Osteoarthritis   . A-fib   . CHF (congestive heart failure)   . PONV (postoperative nausea and vomiting)   . Hypertension   . Dysrhythmia     ATRIAL FIBRILATION  . Shortness of breath dyspnea   . Anxiety     Assessment: 63 year old male recently discharged on Xarelto following cardioversion returns today for increasing shortness of breath. Last Xarelto dose this AM at 6:30 am  Goal of Therapy:  aPTT 66-102 seconds seconds Monitor platelets by anticoagulation protocol: Yes  Heparin level = 0.3 to 0.7   Plan:  Baseline PTT and heparin level ordered Heparin drip to start tomorrow at 6:30 am -- 24 hours after last Xarelto dose at 1650 units / hr Heparin level / PTT 8 hours after heparin starts Daily heparin level, PTT, CBC  Thank you. Okey Regal, PharmD [phone]   Elwin Sleight 04/05/2015,4:02 PM

## 2015-04-05 NOTE — Progress Notes (Signed)
Pt states he feels dizzy. MD informed. Requested to try Pt off bipap at this time on [place] to see how he does. Pt taken off bipap and placed on 4L [place]. Pt asking for water. RN aware.

## 2015-04-05 NOTE — H&P (Signed)
Charles Mckinney is an 64 y.o. male.   Chief Complaint: progressive increasing shortness of breath HPI: Patient is 64 year old male with past medical history significant for hypertensive heart disease with systolic dysfunction, history of congestive heart failure secondary to systolic dysfunction, history of atrial fibrillation status post cardioversion 2 in February and a few days ago, hypercholesteremia, morbid obesity, GERD, degenerative joint disease, possibly obesity hypoventilation syndrome, positive family history of coronary artery disease, came to the ER complaining of progressive increasing shortness of breath which woke him up around 6 AM called his ex-wife and came to the ED. Patient also gives history of PND orthopnea and leg swelling states occasionally he has to sleep and recliner. Denies any anginal chest pain nausea vomiting. Denies palpitation lightheadedness or syncope patient was noted to be hypoxic with sats of 80 requiring BiPAP and IV  Lasix with improvement in his symptoms. Patient denies any noncompliance to medication or diet. Denies any fever or chills. But complains of occasional cough. Denies any ischemic cardiac workup in the past. States had appointment to see cardiologist at [facility]. Patient was discharged 2 days ago by Dr. Doylene Canard following cardioversion.   Past Medical History  Diagnosis Date  . Hyperlipidemia   . Hypertensive heart disease   . Obesity (BMI 30-39.9)   . GERD (gastroesophageal reflux disease)   . Kidney stones   . Osteoarthritis   . A-fib   . CHF (congestive heart failure)   . PONV (postoperative nausea and vomiting)   . Hypertension   . Dysrhythmia     ATRIAL FIBRILATION  . Shortness of breath dyspnea   . Anxiety     Past Surgical History  Procedure Laterality Date  . Cataract extraction    . Cardioversion N/A 01/19/2015    Procedure: CARDIOVERSION;  Surgeon: Jacolyn Reedy, MD;  Location: [facility] ENDOSCOPY;  Service: Cardiovascular;  Laterality:  N/A;  pt in Afib, 12:22 synched cardioversion @  120 joules using Propofol 100 mg,IV....unsuccessful, repeated at 200 joules  . Cardioversion N/A 04/02/2015    Procedure: CARDIOVERSION;  Surgeon: Dixie Dials, MD;  Location: [facility] ENDOSCOPY;  Service: Cardiovascular;  Laterality: N/A;    Family History  Problem Relation Age of Onset  . Diabetes Mother   . Heart disease Father   . Stroke Brother   . Heart disease Brother   . Diabetes Brother    Social History:  reports that he has quit smoking. He has never used smokeless tobacco. He reports that he does not drink alcohol or use illicit drugs.  Allergies:  Allergies  Allergen Reactions  . Niacin And Related Other (See Comments)    FLUSHING  . Penicillins Itching and Rash     (Not in a hospital admission)  Results for orders placed or performed during the hospital encounter of 04/05/15 (from the past 48 hour(s))  CBC     Status: Abnormal   Collection Time: 04/05/15  7:11 AM  Result Value Ref Range   WBC 12.1 (H) 4.0 - 10.5 K/uL   RBC 5.30 4.22 - 5.81 MIL/uL   Hemoglobin 15.5 13.0 - 17.0 g/dL   HCT 47.0 39.0 - 52.0 %   MCV 88.7 78.0 - 100.0 fL   MCH 29.2 26.0 - 34.0 pg   MCHC 33.0 30.0 - 36.0 g/dL   RDW 14.7 11.5 - 15.5 %   Platelets 258 150 - 400 K/uL  Basic metabolic panel     Status: Abnormal   Collection Time: 04/05/15  7:11 AM  Result Value Ref Range   Sodium 139 135 - 145 mmol/L   Potassium 4.5 3.5 - 5.1 mmol/L   Chloride 101 101 - 111 mmol/L   CO2 25 22 - 32 mmol/L   Glucose, Bld 259 (H) 70 - 99 mg/dL   BUN 23 (H) 6 - 20 mg/dL   Creatinine, Ser 1.66 (H) 0.61 - 1.24 mg/dL   Calcium 9.3 8.9 - 10.3 mg/dL   GFR calc non Af Amer 42 (L) >60 mL/min   GFR calc Af Amer 49 (L) >60 mL/min    Comment: (NOTE) The eGFR has been calculated using the CKD EPI equation. This calculation has not been validated in all clinical situations. eGFR's persistently <90 mL/min signify possible Chronic Kidney Disease.    Anion gap 13 5 -  15  BNP (order ONLY if patient complains of dyspnea/SOB AND you have documented it for THIS visit)     Status: Abnormal   Collection Time: 04/05/15  7:11 AM  Result Value Ref Range   B Natriuretic Peptide 793.6 (H) 0.0 - 100.0 pg/mL  Protime-INR     Status: Abnormal   Collection Time: 04/05/15  7:11 AM  Result Value Ref Range   Prothrombin Time 17.0 (H) 11.6 - 15.2 seconds   INR 1.37 0.00 - 1.49  I-stat troponin, ED  (not at [facility], [facility])     Status: None   Collection Time: 04/05/15  7:20 AM  Result Value Ref Range   Troponin i, poc 0.01 0.00 - 0.08 ng/mL   Comment 3            Comment: Due to the release kinetics of cTnI, a negative result within the first hours of the onset of symptoms does not rule out myocardial infarction with certainty. If myocardial infarction is still suspected, repeat the test at appropriate intervals.   I-stat chem 8, ed     Status: Abnormal   Collection Time: 04/05/15  7:21 AM  Result Value Ref Range   Sodium 139 135 - 145 mmol/L   Potassium 4.4 3.5 - 5.1 mmol/L   Chloride 102 101 - 111 mmol/L   BUN 27 (H) 6 - 20 mg/dL   Creatinine, Ser 1.50 (H) 0.61 - 1.24 mg/dL   Glucose, Bld 264 (H) 70 - 99 mg/dL   Calcium, Ion 1.15 1.13 - 1.30 mmol/L   TCO2 22 0 - 100 mmol/L   Hemoglobin 17.0 13.0 - 17.0 g/dL   HCT 50.0 39.0 - 52.0 %  I-Stat CG4 Lactic Acid, ED     Status: Abnormal   Collection Time: 04/05/15  7:22 AM  Result Value Ref Range   Lactic Acid, Venous 2.84 (HH) 0.5 - 2.0 mmol/L  I-Stat venous blood gas, ED     Status: Abnormal   Collection Time: 04/05/15  7:24 AM  Result Value Ref Range   pH, Ven 7.264 7.250 - 7.300   pCO2, Ven 55.5 (H) 45.0 - 50.0 mmHg   pO2, Ven 54.0 (H) 30.0 - 45.0 mmHg   Bicarbonate 25.1 (H) 20.0 - 24.0 mEq/L   TCO2 27 0 - 100 mmol/L   O2 Saturation 82.0 %   Acid-base deficit 3.0 (H) 0.0 - 2.0 mmol/L   Sample type VENOUS    Dg Chest Port 1 View  04/05/2015   CLINICAL DATA:  Shortness of Breath  EXAM: PORTABLE CHEST - 1  VIEW  COMPARISON:  March 31, 2015  FINDINGS: There is moderate generalized interstitial edema. There are small pleural effusions with  cardiomegaly and pulmonary venous hypertension. There is slightly more interstitial edema compared to recent prior study. There is mild airspace consolidation in the left base. No adenopathy.  IMPRESSION: Congestive heart failure, with increased interstitial edema compared to most recent prior study. Patchy opacity in the left base probably represents alveolar edema, although superimposed pneumonia in the left base cannot be excluded radiographically.   Electronically Signed   By: Lowella Grip III M.D.   On: 04/05/2015 07:34    Review of Systems  Constitutional: Negative for fever and chills.  Eyes: Positive for double vision. Negative for photophobia.  Respiratory: Positive for cough and shortness of breath. Negative for sputum production.   Cardiovascular: Positive for orthopnea, leg swelling and PND. Negative for chest pain, palpitations and claudication.  Gastrointestinal: Negative for vomiting and abdominal pain.  Genitourinary: Negative for dysuria.  Neurological: Positive for dizziness. Negative for headaches.    Blood pressure 127/82, pulse 36, resp. rate 22, SpO2 96 %. Physical Exam  Constitutional: He is oriented to person, place, and time.  HENT:  Head: Normocephalic and atraumatic.  Eyes: Left eye exhibits no discharge. No scleral icterus.  Neck: Normal range of motion. Neck supple. JVD present. No tracheal deviation present. No thyromegaly present.  Cardiovascular: Normal rate and regular rhythm.   Murmur (Soft systolic murmur and S3 gallop noted) heard. Respiratory:  Bilateral rales noted  GI: Soft. Bowel sounds are normal. He exhibits no distension. There is no tenderness. There is no rebound.  Musculoskeletal:  No clubbing cyanosis 3+ edema noted  Neurological: He is alert and oriented to person, place, and time.      Assessment/Plan Acute on chronic decompensated systolic heart failure Hypertension Status post A. fib status post cardioversion 2 and recent past Morbid obesity Hypercholesteremia Obesity hypoventilation syndrome/obstructive sleep apnea Degenerative joint disease GERD Positive family history of coronary artery disease Plan As per orders   Charolette Forward 04/05/2015, 9:41 AM

## 2015-04-05 NOTE — ED Notes (Signed)
Pt. States he wasn't feelin too good yesterday and woke up this morning SOB. Pt. Is wheezing on arrival, breathing shallow with abdominal muscle use.

## 2015-04-05 NOTE — ED Provider Notes (Signed)
CSN: [id number]     Arrival date & time 04/05/15  1749 History   First MD Initiated Contact with Patient 04/05/15 [phone]     Chief Complaint  Patient presents with  . Shortness of Breath     (Consider location/radiation/quality/duration/timing/severity/associated sxs/prior Treatment) HPI Comments: Patient history of atrial fibrillation on Xarelto and CHF presents with shortness of breath. He was recently admitted on April 26 for shortness of breath and CHF and had a DC cardioversion for atrial fibrillation. He was DC'd on April 28. He states he's been doing well until yesterday when he was having increased shortness of breath with short ambulation. This morning he was having markedly increased shortness of breath. He denies any chest pain or tightness.  He denies any fevers. He's had a little bit of cough which is mostly nonproductive. He states his symptoms have been worsening throughout the morning. He was noted to have an oxygen saturation of 80% on room air on arrival.  Patient is a 64 y.o. male presenting with shortness of breath.  Shortness of Breath Associated symptoms: diaphoresis   Associated symptoms: no abdominal pain, no chest pain, no cough, no fever, no headaches, no rash and no vomiting     Past Medical History  Diagnosis Date  . Hyperlipidemia   . Hypertensive heart disease   . Obesity (BMI 30-39.9)   . GERD (gastroesophageal reflux disease)   . Kidney stones   . Osteoarthritis   . A-fib   . CHF (congestive heart failure)   . PONV (postoperative nausea and vomiting)   . Hypertension   . Dysrhythmia     ATRIAL FIBRILATION  . Shortness of breath dyspnea   . Anxiety    Past Surgical History  Procedure Laterality Date  . Cataract extraction    . Cardioversion N/A 01/19/2015    Procedure: CARDIOVERSION;  Surgeon: Othella Boyer, MD;  Location: [facility] ENDOSCOPY;  Service: Cardiovascular;  Laterality: N/A;  pt in Afib, 12:22 synched cardioversion @  120 joules using Propofol  100 mg,IV....unsuccessful, repeated at 200 joules  . Cardioversion N/A 04/02/2015    Procedure: CARDIOVERSION;  Surgeon: Orpah Cobb, MD;  Location: [facility] ENDOSCOPY;  Service: Cardiovascular;  Laterality: N/A;   Family History  Problem Relation Age of Onset  . Diabetes Mother   . Heart disease Father   . Stroke Brother   . Heart disease Brother   . Diabetes Brother    History  Substance Use Topics  . Smoking status: Former Games developer  . Smokeless tobacco: Never Used  . Alcohol Use: No    Review of Systems  Constitutional: Positive for diaphoresis and fatigue. Negative for fever and chills.  HENT: Negative for congestion, rhinorrhea and sneezing.   Eyes: Negative.   Respiratory: Positive for shortness of breath. Negative for cough and chest tightness.   Cardiovascular: Positive for leg swelling. Negative for chest pain.  Gastrointestinal: Negative for nausea, vomiting, abdominal pain, diarrhea and blood in stool.  Genitourinary: Negative for frequency, hematuria, flank pain and difficulty urinating.  Musculoskeletal: Negative for back pain and arthralgias.  Skin: Negative for rash.  Neurological: Negative for dizziness, speech difficulty, weakness, numbness and headaches.      Allergies  Niacin and related and Penicillins  Home Medications   Prior to Admission medications   Medication Sig Start Date End Date Taking? Authorizing Provider  acetaminophen (TYLENOL) 500 MG tablet Take 1,000 mg by mouth every 6 (six) hours as needed for moderate pain.    Historical Provider, MD  amiodarone (PACERONE) 200 MG tablet Take 1 tablet (200 mg total) by mouth 2 (two) times daily. 01/20/15   Leroy Sea, MD  aspirin 81 MG EC tablet Take 1 tablet (81 mg total) by mouth daily. Swallow whole. 09/03/12   [name], MD  carvedilol (COREG) 12.5 MG tablet Take 1 tablet (12.5 mg total) by mouth 2 (two) times daily with a meal. 01/20/15   Leroy Sea, MD  cholecalciferol (VITAMIN D)  1000 UNITS tablet Take 1,000 Units by mouth daily.    Historical Provider, MD  colchicine 0.6 MG tablet Take 1 tablet (0.6 mg total) by mouth daily. 01/20/15   Leroy Sea, MD  furosemide (LASIX) 40 MG tablet Take 1 tablet (40 mg total) by mouth 2 (two) times daily. 12/22/14   Brittainy Sherlynn Carbon, PA-C  lisinopril (PRINIVIL,ZESTRIL) 2.5 MG tablet Take 1 tablet (2.5 mg total) by mouth daily. 01/20/15   Leroy Sea, MD  nitroGLYCERIN (NITROSTAT) 0.4 MG SL tablet Place 1 tablet (0.4 mg total) under the tongue every 5 (five) minutes as needed for chest pain. 12/22/14   Brittainy Sherlynn Carbon, PA-C  potassium chloride SA (K-DUR,KLOR-CON) 20 MEQ tablet Take 2 tablets (40 mEq total) by mouth daily. 12/22/14   Brittainy Sherlynn Carbon, PA-C  pravastatin (PRAVACHOL) 40 MG tablet Take 1 tablet (40 mg total) by mouth daily. 12/22/14   Brittainy Sherlynn Carbon, PA-C  rivaroxaban (XARELTO) 20 MG TABS tablet Take 1 tablet (20 mg total) by mouth daily with supper. 12/22/14   Brittainy M Simmons, PA-C   BP 123/71 mmHg  Pulse 79  Resp 28  SpO2 96% Physical Exam  Constitutional: He is oriented to person, place, and time. He appears well-developed and well-nourished. He appears distressed.  HENT:  Head: Normocephalic and atraumatic.  Eyes: Pupils are equal, round, and reactive to light.  Neck: Normal range of motion. Neck supple.  Cardiovascular: Normal rate, regular rhythm and normal heart sounds.   Pulmonary/Chest: Breath sounds normal. He is in respiratory distress. He has no wheezes. He has no rales. He exhibits no tenderness.  Patient has tachypnea with diminished breath sounds bilaterally and crackles bilaterally.  He is talking in short sentences with increased work of breathing  Abdominal: Soft. Bowel sounds are normal. There is no tenderness. There is no rebound and no guarding.  Musculoskeletal: Normal range of motion. He exhibits edema (Marked pitting edema bilaterally).  Lymphadenopathy:    He has no  cervical adenopathy.  Neurological: He is alert and oriented to person, place, and time.  Skin: Skin is warm. No rash noted. He is diaphoretic.  Psychiatric: He has a normal mood and affect.    ED Course  Procedures (including critical care time) Labs Review Results for orders placed or performed during the hospital encounter of 04/05/15  CBC  Result Value Ref Range   WBC 12.1 (H) 4.0 - 10.5 K/uL   RBC 5.30 4.22 - 5.81 MIL/uL   Hemoglobin 15.5 13.0 - 17.0 g/dL   HCT 56.3 87.5 - 64.3 %   MCV 88.7 78.0 - 100.0 fL   MCH 29.2 26.0 - 34.0 pg   MCHC 33.0 30.0 - 36.0 g/dL   RDW 32.9 51.8 - 84.1 %   Platelets 258 150 - 400 K/uL  Basic metabolic panel  Result Value Ref Range   Sodium 139 135 - 145 mmol/L   Potassium 4.5 3.5 - 5.1 mmol/L   Chloride 101 101 - 111 mmol/L   CO2 25 22 -  32 mmol/L   Glucose, Bld 259 (H) 70 - 99 mg/dL   BUN 23 (H) 6 - 20 mg/dL   Creatinine, Ser 9.60 (H) 0.61 - 1.24 mg/dL   Calcium 9.3 8.9 - 45.4 mg/dL   GFR calc non Af Amer 42 (L) >60 mL/min   GFR calc Af Amer 49 (L) >60 mL/min   Anion gap 13 5 - 15  BNP (order ONLY if patient complains of dyspnea/SOB AND you have documented it for THIS visit)  Result Value Ref Range   B Natriuretic Peptide 793.6 (H) 0.0 - 100.0 pg/mL  Protime-INR  Result Value Ref Range   Prothrombin Time 17.0 (H) 11.6 - 15.2 seconds   INR 1.37 0.00 - 1.49  I-stat troponin, ED  (not at [facility])  Result Value Ref Range   Troponin i, poc 0.01 0.00 - 0.08 ng/mL   Comment 3          I-Stat CG4 Lactic Acid, ED  Result Value Ref Range   Lactic Acid, Venous 2.84 (HH) 0.5 - 2.0 mmol/L  I-stat chem 8, ed  Result Value Ref Range   Sodium 139 135 - 145 mmol/L   Potassium 4.4 3.5 - 5.1 mmol/L   Chloride 102 101 - 111 mmol/L   BUN 27 (H) 6 - 20 mg/dL   Creatinine, Ser 0.98 (H) 0.61 - 1.24 mg/dL   Glucose, Bld 119 (H) 70 - 99 mg/dL   Calcium, Ion 1.47 8.29 - 1.30 mmol/L   TCO2 22 0 - 100 mmol/L   Hemoglobin 17.0 13.0 - 17.0 g/dL   HCT  56.2 13.0 - 86.5 %  I-Stat venous blood gas, ED  Result Value Ref Range   pH, Ven 7.264 7.250 - 7.300   pCO2, Ven 55.5 (H) 45.0 - 50.0 mmHg   pO2, Ven 54.0 (H) 30.0 - 45.0 mmHg   Bicarbonate 25.1 (H) 20.0 - 24.0 mEq/L   TCO2 27 0 - 100 mmol/L   O2 Saturation 82.0 %   Acid-base deficit 3.0 (H) 0.0 - 2.0 mmol/L   Sample type VENOUS    Dg Chest 2 View  03/31/2015   CLINICAL DATA:  64 year old male with shortness of breath and chest tightness  EXAM: CHEST  2 VIEW  COMPARISON:  Prior chest x-ray 01/12/2015  FINDINGS: Stable cardiomegaly. Pulmonary vascular congestion with mild interstitial edema. Small bilateral layering pleural effusions with associated bibasilar atelectasis. No evidence of pneumothorax or focal airspace consolidation. No acute osseous abnormality.  IMPRESSION: 1. Findings most consistent with moderate CHF. 2. Small bilateral layering pleural effusions and associated bibasilar atelectasis.   Electronically Signed   By: Malachy Moan M.D.   On: 03/31/2015 21:14   Dg Chest Port 1 View  04/05/2015   CLINICAL DATA:  Shortness of Breath  EXAM: PORTABLE CHEST - 1 VIEW  COMPARISON:  March 31, 2015  FINDINGS: There is moderate generalized interstitial edema. There are small pleural effusions with cardiomegaly and pulmonary venous hypertension. There is slightly more interstitial edema compared to recent prior study. There is mild airspace consolidation in the left base. No adenopathy.  IMPRESSION: Congestive heart failure, with increased interstitial edema compared to most recent prior study. Patchy opacity in the left base probably represents alveolar edema, although superimposed pneumonia in the left base cannot be excluded radiographically.   Electronically Signed   By: Bretta Bang III M.D.   On: 04/05/2015 07:34      Imaging Review Dg Chest Port 1 View  04/05/2015  CLINICAL DATA:  Shortness of Breath  EXAM: PORTABLE CHEST - 1 VIEW  COMPARISON:  March 31, 2015  FINDINGS: There  is moderate generalized interstitial edema. There are small pleural effusions with cardiomegaly and pulmonary venous hypertension. There is slightly more interstitial edema compared to recent prior study. There is mild airspace consolidation in the left base. No adenopathy.  IMPRESSION: Congestive heart failure, with increased interstitial edema compared to most recent prior study. Patchy opacity in the left base probably represents alveolar edema, although superimposed pneumonia in the left base cannot be excluded radiographically.   Electronically Signed   By: Bretta Bang III M.D.   On: 04/05/2015 07:34     EKG Interpretation None     ED ECG REPORT   Date: 04/05/2015  Rate: 106  Rhythm: sinus tachycardia  QRS Axis: left  Intervals: normal  ST/T Wave abnormalities: nonspecific ST/T changes  Conduction Disutrbances:left anterior fascicular block  Narrative Interpretation:   Old EKG Reviewed: changes noted  I have personally reviewed the EKG tracing and agree with the computerized printout as noted.   MDM   Final diagnoses:  Acute on chronic systolic congestive heart failure    Patient presented in CHF with respiratory distress. He is markedly diaphoretic and had increased work of breathing on arrival. His oxygen saturations are 80% on room air. He was placed on nonrebreather and his sats went up to the mid 90s but he still is markedly tachypnea, with increased work of breathing. He was started on nitroglycerin drip and given 80 mg of Lasix. He was subsequent started on BiPAP. He initially wasn't tolerating the BiPAP well however he was able to calm down and ultimately the BiPAP was very effective in improving his respirations. His tachypnea markedly improved. His diaphoresis resolved. He never had chest pain. His troponin is negative. His EKG shows some nonspecific changes and increased tachypnea but no definite ischemic changes. I will consult cardiology for admission.  As pt was  recently admitted to Dr. [name], is a bounce back, spoke with Dr. Sharyn Lull who is on call for him who will come in and admit pt.    Have decreased nitro drip as BP was dropping, pt has had about 300cc UOP  CRITICAL CARE Performed by: Tiaira Arambula Total critical care time: 60 Critical care time was exclusive of separately billable procedures and treating other patients. Critical care was necessary to treat or prevent imminent or life-threatening deterioration. Critical care was time spent personally by me on the following activities: development of treatment plan with patient and/or surrogate as well as nursing, discussions with consultants, evaluation of patient's response to treatment, examination of patient, obtaining history from patient or surrogate, ordering and performing treatments and interventions, ordering and review of laboratory studies, ordering and review of radiographic studies, pulse oximetry and re-evaluation of patient's condition.   Rolan Bucco, MD 04/05/15 [phone]

## 2015-04-06 LAB — BASIC METABOLIC PANEL
ANION GAP: 10 (ref 5–15)
BUN: 27 mg/dL — ABNORMAL HIGH (ref 6–20)
CHLORIDE: 100 mmol/L — AB (ref 101–111)
CO2: 32 mmol/L (ref 22–32)
Calcium: 8.9 mg/dL (ref 8.9–10.3)
Creatinine, Ser: 1.54 mg/dL — ABNORMAL HIGH (ref 0.61–1.24)
GFR calc Af Amer: 53 mL/min — ABNORMAL LOW (ref >60)
GFR, EST NON AFRICAN AMERICAN: 46 mL/min — AB (ref >60)
Glucose, Bld: 107 mg/dL — ABNORMAL HIGH (ref 70–99)
POTASSIUM: 3.5 mmol/L (ref 3.5–5.1)
SODIUM: 142 mmol/L (ref 135–145)

## 2015-04-06 LAB — HEPARIN LEVEL (UNFRACTIONATED)
HEPARIN UNFRACTIONATED: 0.81 [IU]/mL — AB (ref 0.30–0.70)
Heparin Unfractionated: 0.58 IU/mL (ref 0.30–0.70)

## 2015-04-06 LAB — TROPONIN I: Troponin I: 0.03 ng/mL (ref <0.031)

## 2015-04-06 LAB — APTT
aPTT: 59 seconds — ABNORMAL HIGH (ref 24–37)
aPTT: 78 seconds — ABNORMAL HIGH (ref 24–37)

## 2015-04-06 MED ORDER — POTASSIUM CHLORIDE CRYS ER 20 MEQ PO TBCR
40.0000 meq | EXTENDED_RELEASE_TABLET | Freq: Once | ORAL | Status: AC
  Administered 2015-04-06: 40 meq via ORAL
  Filled 2015-04-06: qty 2

## 2015-04-06 MED ORDER — AMIODARONE HCL 200 MG PO TABS
200.0000 mg | ORAL_TABLET | Freq: Every day | ORAL | Status: DC
  Administered 2015-04-07 – 2015-04-12 (×6): 200 mg via ORAL
  Filled 2015-04-06 (×6): qty 1

## 2015-04-06 NOTE — Progress Notes (Signed)
Subjective:  Patient denies any chest pain states breathing has improved.  States he intends to follow-up at [place].QTC is prolonged.  Remains in sinus rhythm  Objective:  Vital Signs in the last 24 hours: Temp:  [97.7 F (36.5 C)-99 F (37.2 C)] 98.7 F (37.1 C) (05/02 0742) Pulse Rate:  [40-86] 72 (05/01 2356) Resp:  [16-33] 16 (05/02 0546) BP: (96-121)/(54-77) 101/54 mmHg (05/02 0546) SpO2:  [92 %-97 %] 96 % (05/02 0546) Weight:  [148.734 kg (327 lb 14.4 oz)-149.7 kg (330 lb 0.5 oz)] 148.734 kg (327 lb 14.4 oz) (05/02 0546)  Intake/Output from previous day: 05/01 0701 - 05/02 0700 In: 480 [P.O.:480] Out: 2865 [Urine:2865] Intake/Output from this shift: Total I/O In: -  Out: 700 [Urine:700]  Physical Exam: Neck: no adenopathy, no carotid bruit, no JVD and supple, symmetrical, trachea midline Lungs: decreased breath sounds at bases with faint rails air entry improved Heart: regular rate and rhythm, S1, S2 normal and soft systolic murmur and S3 gallop noted Abdomen: soft, non-tender; bowel sounds normal; no masses,  no organomegaly Extremities: no clubbing cyanosis 2+ edema noted  Lab Results:  Recent Labs  04/05/15 0711 04/05/15 0721 04/05/15 1900  WBC 12.1*  --  8.6  HGB 15.5 17.0 14.0  PLT 258  --  201    Recent Labs  04/05/15 1900 04/06/15 0321  NA 139 142  K 3.5 3.5  CL 99* 100*  CO2 29 32  GLUCOSE 158* 107*  BUN 24* 27*  CREATININE 1.55* 1.54*    Recent Labs  04/05/15 2133 04/06/15 0321  TROPONINI 0.03 0.03   Hepatic Function Panel  Recent Labs  04/05/15 1900  PROT 6.3*  ALBUMIN 3.4*  AST 18  ALT 15*  ALKPHOS 53  BILITOT 0.7   No results for input(s): CHOL in the last 72 hours. No results for input(s): PROTIME in the last 72 hours.  Imaging: Imaging results have been reviewed and Dg Chest Port 1 View  04/05/2015   CLINICAL DATA:  Shortness of Breath  EXAM: PORTABLE CHEST - 1 VIEW  COMPARISON:  March 31, 2015  FINDINGS: There is moderate  generalized interstitial edema. There are small pleural effusions with cardiomegaly and pulmonary venous hypertension. There is slightly more interstitial edema compared to recent prior study. There is mild airspace consolidation in the left base. No adenopathy.  IMPRESSION: Congestive heart failure, with increased interstitial edema compared to most recent prior study. Patchy opacity in the left base probably represents alveolar edema, although superimposed pneumonia in the left base cannot be excluded radiographically.   Electronically Signed   By: Bretta Bang III M.D.   On: 04/05/2015 07:34    Cardiac Studies:  Assessment/Plan:  ResolvingAcute on chronic decompensated systolic heart failure Hypertension Status post A. 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 Check labs in a.m.  LOS: 1 day    Rinaldo Cloud 04/06/2015, 10:06 AM

## 2015-04-06 NOTE — Anesthesia Postprocedure Evaluation (Signed)
Anesthesia Post Note  Patient: Charles Mckin[facility]  Procedure(s) Performed: Procedure(s) (LRB): CARDIOVERSION (N/A)  Anesthesia type: General  Patient location: PACU  Post pain: Pain level controlled  Post assessment: Post-op Vital signs reviewed  Last Vitals: BP 105/63 mmHg  Pulse 85  Temp(Src) 36.8 C (Oral)  Resp 18  Ht 5\' 10"  (1.778 m)  Wt 327 lb 1.6 oz (148.372 kg)  BMI 46.93 kg/m2  SpO2 92%  Post vital signs: Reviewed  Level of consciousness: sedated  Complications: No apparent anesthesia complications

## 2015-04-06 NOTE — Progress Notes (Signed)
ANTICOAGULATION CONSULT NOTE  Pharmacy Consult for heparin Indication: atrial fibrillation  Allergies  Allergen Reactions  . Niacin And Related Other (See Comments)    FLUSHING  . Penicillins Itching and Rash    Patient Measurements: Height: 5\' 10"  (177.8 cm) Weight: (!) 327 lb 14.4 oz (148.734 kg) IBW/kg (Calculated) : 73 Heparin Dosing Weight: 111 kg  Vital Signs: Temp: 98.3 F (36.8 C) (05/02 2029) Temp Source: Oral (05/02 2029) BP: 111/64 mmHg (05/02 2029) Pulse Rate: 73 (05/02 2029)  Labs:  Recent Labs  04/05/15 0711 04/05/15 0721 04/05/15 1601 04/05/15 1900 04/05/15 2133 04/06/15 0321 04/06/15 1505 04/06/15 2233  HGB 15.5 17.0  --  14.0  --   --   --   --   HCT 47.0 50.0  --  42.5  --   --   --   --   PLT 258  --   --  201  --   --   --   --   APTT  --   --  30  --   --   --  59* 78*  LABPROT 17.0*  --   --  15.9*  --   --   --   --   INR 1.37  --   --  1.25  --   --   --   --   HEPARINUNFRC  --   --   --  1.21*  --   --  0.58 0.81*  CREATININE 1.66* 1.50*  --  1.55*  --  1.54*  --   --   TROPONINI  --   --   --  0.03 0.03 0.03  --   --     Estimated Creatinine Clearance: 70.8 mL/min (by C-G formula based on Cr of 1.54).  Assessment: 64 y.o. male with CHF, h/o Afib Xarelto on hold, for heparin  Goal of Therapy:  aPTT 66-102 seconds Monitor platelets by anticoagulation protocol: Yes   Plan:  Continue Heparin at current rate  Eddie Candle 04/06/2015,11:36 PM

## 2015-04-06 NOTE — Care Management Note (Addendum)
Case Management Note  Patient Details  Name: Charles Mckinney MRN: [id number] Date of Birth: 1951-08-22  Subjective/Objective:  Pt admitted for increased SOB-CHF. Pt is a readmission.                  Action/Plan: Pt will benefit from [facility] once medically stable. CM did speak to April Alexander with the [facility]. Pt uses [facility] MD Raphael Gibney and CSW Redmon Mock @ [phone] ext [phone]. If pt needs HH Services, CM will contact CSW. If pt needs DME will have to use outside agency [facility] in [place]. Respiratory Therapist to [facility] is Carolynn Serve ext [phone]. CM will continue to monitor.  Expected Discharge Date:  04/08/15               Expected Discharge Plan:  Home w Home Health Services  In-House Referral:  NA  Discharge planning Services  CM Consult  Post Acute Care Choice:    Choice offered to:     DME Arranged:    DME Agency:     HH Arranged:    HH Agency:     Status of Service:  In process, will continue to follow  Medicare Important Message Given:  No Date Medicare IM Given:    Medicare IM give by:    Date Additional Medicare IM Given:    Additional Medicare Important Message give by:     If discussed at Long Length of Stay Meetings, dates discussed:    Additional Comments: 1501 04-09-15 Tomi Bamberger, RN,BSN [phone] CM did ask staff RN to ambulate pt to see if needed 02. Pt plan for cath today. If needs 02 consult needs to be placed for CM for 02. Will have to go through [place] in above note to get 02 delivered if needs. No further needs at this time.    Gala Lewandowsky, RN 04/06/2015, 3:22 PM

## 2015-04-06 NOTE — Progress Notes (Signed)
ANTICOAGULATION CONSULT NOTE - Follow Up Consult  Pharmacy Consult for heparin Indication: atrial fibrillation  Allergies  Allergen Reactions  . Niacin And Related Other (See Comments)    FLUSHING  . Penicillins Itching and Rash    Patient Measurements: Height: 5\' 10"  (177.8 cm) Weight: (!) 327 lb 14.4 oz (148.734 kg) IBW/kg (Calculated) : 73 Heparin Dosing Weight: 111 kg  Vital Signs: Temp: 97.7 F (36.5 C) (05/02 1129) Temp Source: Oral (05/02 1129) BP: 101/54 mmHg (05/02 0546)  Labs:  Recent Labs  04/05/15 0711 04/05/15 0721 04/05/15 1601 04/05/15 1900 04/05/15 2133 04/06/15 0321  HGB 15.5 17.0  --  14.0  --   --   HCT 47.0 50.0  --  42.5  --   --   PLT 258  --   --  201  --   --   APTT  --   --  30  --   --   --   LABPROT 17.0*  --   --  15.9*  --   --   INR 1.37  --   --  1.25  --   --   HEPARINUNFRC  --   --   --  1.21*  --   --   CREATININE 1.66* 1.50*  --  1.55*  --  1.54*  TROPONINI  --   --   --  0.03 0.03 0.03    Estimated Creatinine Clearance: 70.8 mL/min (by C-G formula based on Cr of 1.54).   Medications:  Scheduled:  . [START ON 04/07/2015] amiodarone  200 mg Oral Daily  . aspirin EC  81 mg Oral Daily  . carvedilol  3.125 mg Oral BID WC  . cholecalciferol  1,000 Units Oral Daily  . furosemide  40 mg Intravenous BID  . potassium chloride SA  40 mEq Oral Daily  . pravastatin  40 mg Oral Daily  . sodium chloride  3 mL Intravenous Q12H   Infusions:  . heparin 1,650 Units/hr (04/06/15 0612)  . nitroGLYCERIN 5 mcg/min (04/05/15 1258)    Assessment: 64 yo male recently discharged on Xarelto is currently on subtherapeutic heparin.  Heparin level and aPTT are 0.58 and 59, respectively.  Last dose 05/01  Goal of Therapy:  aPTT 66-102 seconds Monitor platelets by anticoagulation protocol: Yes   Plan:  - increase heparin to 1800 units/hr. 6hr heparin level and aPTT  Sherrie Marsan, Tsz-Yin 04/06/2015,3:22 PM

## 2015-04-07 LAB — BASIC METABOLIC PANEL
Anion gap: 12 (ref 5–15)
BUN: 25 mg/dL — ABNORMAL HIGH (ref 6–20)
CALCIUM: 9 mg/dL (ref 8.9–10.3)
CO2: 30 mmol/L (ref 22–32)
CREATININE: 1.29 mg/dL — AB (ref 0.61–1.24)
Chloride: 101 mmol/L (ref 101–111)
GFR calc non Af Amer: 57 mL/min — ABNORMAL LOW (ref >60)
Glucose, Bld: 102 mg/dL — ABNORMAL HIGH (ref 70–99)
Potassium: 3.7 mmol/L (ref 3.5–5.1)
Sodium: 143 mmol/L (ref 135–145)

## 2015-04-07 LAB — CBC
HCT: 42.5 % (ref 39.0–52.0)
Hemoglobin: 13.7 g/dL (ref 13.0–17.0)
MCH: 28.8 pg (ref 26.0–34.0)
MCHC: 32.2 g/dL (ref 30.0–36.0)
MCV: 89.5 fL (ref 78.0–100.0)
Platelets: 197 10*3/uL (ref 150–400)
RBC: 4.75 MIL/uL (ref 4.22–5.81)
RDW: 14.9 % (ref 11.5–15.5)
WBC: 7.8 10*3/uL (ref 4.0–10.5)

## 2015-04-07 LAB — HEPARIN LEVEL (UNFRACTIONATED): HEPARIN UNFRACTIONATED: 0.81 [IU]/mL — AB (ref 0.30–0.70)

## 2015-04-07 LAB — BRAIN NATRIURETIC PEPTIDE: B Natriuretic Peptide: 256.5 pg/mL — ABNORMAL HIGH (ref 0.0–100.0)

## 2015-04-07 LAB — APTT: aPTT: 92 seconds — ABNORMAL HIGH (ref 24–37)

## 2015-04-07 NOTE — Progress Notes (Signed)
ANTICOAGULATION CONSULT NOTE - Follow Up Consult  Pharmacy Consult for  Heparin Indication: atrial fibrillation  Allergies  Allergen Reactions  . Niacin And Related Other (See Comments)    FLUSHING  . Penicillins Itching and Rash    Patient Measurements: Height: 5\' 10"  (177.8 cm) Weight: (!) 336 lb 6.4 oz (152.59 kg) IBW/kg (Calculated) : 73 Heparin Dosing Weight: 111 kg  Vital Signs: Temp: 98.9 F (37.2 C) (05/03 0800) Temp Source: Oral (05/03 0800) BP: 123/82 mmHg (05/03 0829) Pulse Rate: 93 (05/03 0829)  Labs:  Recent Labs  04/05/15 0711 04/05/15 0721  04/05/15 1900 04/05/15 2133 04/06/15 0321 04/06/15 1505 04/06/15 2233 04/07/15 0428  HGB 15.5 17.0  --  14.0  --   --   --   --  13.7  HCT 47.0 50.0  --  42.5  --   --   --   --  42.5  PLT 258  --   --  201  --   --   --   --  197  APTT  --   --   < >  --   --   --  59* 78* 92*  LABPROT 17.0*  --   --  15.9*  --   --   --   --   --   INR 1.37  --   --  1.25  --   --   --   --   --   HEPARINUNFRC  --   --   < > 1.21*  --   --  0.58 0.81* 0.81*  CREATININE 1.66* 1.50*  --  1.55*  --  1.54*  --   --  1.29*  TROPONINI  --   --   --  0.03 0.03 0.03  --   --   --   < > = values in this interval not displayed.  Estimated Creatinine Clearance: 85.8 mL/min (by C-G formula based on Cr of 1.29).  Assessment:   Hx afib with cardioversion x 2 in February and again 04/02/15.   Home Xarelto on hold for cath when able to lie flat.  Last Xarelto dose 04/05/15 am.   Heparin level is above goal at 0.81, but likely still influenced by recent Xarelto. APTT is on target at 92 seconds.  Goal of Therapy:  INR 2-3 aPTT 66-102 seconds Monitor platelets by anticoagulation protocol: Yes   Plan:   Continue heparin at 1800 units/hr.  Daily heparin level, aPTT and CBC.  Expect Xarelto to resume after cath.  Dennie Fetters, RPh Pager: [phone] 04/07/2015,10:42 AM

## 2015-04-07 NOTE — Progress Notes (Signed)
Ref: Default, Provider, MD   Subjective:  Breathing better. Discussed activity and salt and fluid intake. Receptive to heart catheterization.     Objective:  Vital Signs in the last 24 hours: Temp:  [97.7 F (36.5 C)-98.9 F (37.2 C)] 98.9 F (37.2 C) (05/03 0800) Pulse Rate:  [73-93] 93 (05/03 0829) Cardiac Rhythm:  [-] Normal sinus rhythm (05/03 0800) Resp:  [19-23] 23 (05/03 0800) BP: (111-124)/(64-82) 123/82 mmHg (05/03 0829) SpO2:  [95 %-97 %] 95 % (05/03 0800) Weight:  [152.59 kg (336 lb 6.4 oz)] 152.59 kg (336 lb 6.4 oz) (05/03 0510)  Physical Exam: BP Readings from Last 1 Encounters:  04/07/15 123/82    Wt Readings from Last 1 Encounters:  04/07/15 152.59 kg (336 lb 6.4 oz)    Weight change: 2.89 kg (6 lb 5.9 oz)  HEENT: [place]/AT, Eyes- PERL, EOMI, Conjunctiva-Pink, Sclera-Non-icteric Neck: No JVD, No bruit, Trachea midline. Lungs:  Fine crackles at bases, Bilateral. Cardiac:  Regular rhythm, normal S1 and S2, no S3.  Abdomen:  Soft, non-tender. Extremities:  2 + edema present. No cyanosis. No clubbing. CNS: AxOx3, Cranial nerves grossly intact, moves all 4 extremities. Right handed. Skin: Warm and dry.   Intake/Output from previous day: 05/02 0701 - 05/03 0700 In: 1011.6 [P.O.:500; I.V.:511.6] Out: 1400 [Urine:1400]    Lab Results: BMET    Component Value Date/Time   NA 143 04/07/2015 0428   NA 142 04/06/2015 0321   NA 139 04/05/2015 1900   K 3.7 04/07/2015 0428   K 3.5 04/06/2015 0321   K 3.5 04/05/2015 1900   CL 101 04/07/2015 0428   CL 100* 04/06/2015 0321   CL 99* 04/05/2015 1900   CO2 30 04/07/2015 0428   CO2 32 04/06/2015 0321   CO2 29 04/05/2015 1900   GLUCOSE 102* 04/07/2015 0428   GLUCOSE 107* 04/06/2015 0321   GLUCOSE 158* 04/05/2015 1900   GLUCOSE 99 09/12/2006 1309   BUN 25* 04/07/2015 0428   BUN 27* 04/06/2015 0321   BUN 24* 04/05/2015 1900   CREATININE 1.29* 04/07/2015 0428   CREATININE 1.54* 04/06/2015 0321   CREATININE 1.55*  04/05/2015 1900   CALCIUM 9.0 04/07/2015 0428   CALCIUM 8.9 04/06/2015 0321   CALCIUM 9.3 04/05/2015 1900   GFRNONAA 57* 04/07/2015 0428   GFRNONAA 46* 04/06/2015 0321   GFRNONAA 46* 04/05/2015 1900   GFRAA >60 04/07/2015 0428   GFRAA 53* 04/06/2015 0321   GFRAA 53* 04/05/2015 1900   CBC    Component Value Date/Time   WBC 7.8 04/07/2015 0428   RBC 4.75 04/07/2015 0428   HGB 13.7 04/07/2015 0428   HCT 42.5 04/07/2015 0428   PLT 197 04/07/2015 0428   MCV 89.5 04/07/2015 0428   MCH 28.8 04/07/2015 0428   MCHC 32.2 04/07/2015 0428   RDW 14.9 04/07/2015 0428   LYMPHSABS 1.7 04/05/2015 1900   MONOABS 0.6 04/05/2015 1900   EOSABS 0.2 04/05/2015 1900   BASOSABS 0.0 04/05/2015 1900   HEPATIC Function Panel  Recent Labs  01/13/15 0120 04/05/15 1900  PROT 5.9* 6.3*   HEMOGLOBIN A1C No components found for: HGA1C,  MPG CARDIAC ENZYMES Lab Results  Component Value Date   TROPONINI 0.03 04/06/2015   TROPONINI 0.03 04/05/2015   TROPONINI 0.03 04/05/2015   BNP No results for input(s): PROBNP in the last 8760 hours. TSH  Recent Labs  01/14/15 1140 04/05/15 1900  TSH 3.727 5.204*   CHOLESTEROL No results for input(s): CHOL in the last 8760 hours.  Scheduled Meds: . amiodarone  200 mg Oral Daily  . aspirin EC  81 mg Oral Daily  . carvedilol  3.125 mg Oral BID WC  . cholecalciferol  1,000 Units Oral Daily  . furosemide  40 mg Intravenous BID  . potassium chloride SA  40 mEq Oral Daily  . pravastatin  40 mg Oral Daily  . sodium chloride  3 mL Intravenous Q12H   Continuous Infusions: . heparin 1,800 Units/hr (04/06/15 1723)  . nitroGLYCERIN 5 mcg/min (04/05/15 1258)   PRN Meds:.sodium chloride, acetaminophen, nitroGLYCERIN, ondansetron (ZOFRAN) IV, sodium chloride  Assessment/Plan: Acute on chronic left heart systolic failure. Atrial fibrillation, recurrent. Morbid obesity Hyperlipidemia Hypertension Hypokalemia Prolonged QTc. GERD  Continue diuresis. R  + L heart cath in 1-2 days.     LOS: 2 days    Charles Cobb  MD  04/07/2015, 10:26 AM     2

## 2015-04-08 LAB — CBC
HCT: 43.3 % (ref 39.0–52.0)
Hemoglobin: 13.9 g/dL (ref 13.0–17.0)
MCH: 29 pg (ref 26.0–34.0)
MCHC: 32.1 g/dL (ref 30.0–36.0)
MCV: 90.2 fL (ref 78.0–100.0)
PLATELETS: 210 10*3/uL (ref 150–400)
RBC: 4.8 MIL/uL (ref 4.22–5.81)
RDW: 15 % (ref 11.5–15.5)
WBC: 6.8 10*3/uL (ref 4.0–10.5)

## 2015-04-08 LAB — BASIC METABOLIC PANEL
Anion gap: 14 (ref 5–15)
BUN: 23 mg/dL — AB (ref 6–20)
CO2: 24 mmol/L (ref 22–32)
Calcium: 9 mg/dL (ref 8.9–10.3)
Chloride: 103 mmol/L (ref 101–111)
Creatinine, Ser: 1.24 mg/dL (ref 0.61–1.24)
GFR calc Af Amer: >60 mL/min (ref >60)
GFR calc non Af Amer: 60 mL/min — ABNORMAL LOW (ref >60)
GLUCOSE: 94 mg/dL (ref 70–99)
POTASSIUM: 3.9 mmol/L (ref 3.5–5.1)
Sodium: 141 mmol/L (ref 135–145)

## 2015-04-08 LAB — HEPARIN LEVEL (UNFRACTIONATED): Heparin Unfractionated: 0.76 IU/mL — ABNORMAL HIGH (ref 0.30–0.70)

## 2015-04-08 LAB — APTT: aPTT: 95 seconds — ABNORMAL HIGH (ref 24–37)

## 2015-04-08 MED ORDER — SODIUM CHLORIDE 0.9 % IJ SOLN
3.0000 mL | Freq: Two times a day (BID) | INTRAMUSCULAR | Status: DC
  Administered 2015-04-09: 3 mL via INTRAVENOUS

## 2015-04-08 MED ORDER — SODIUM CHLORIDE 0.9 % IV SOLN: 250.0000 mL | INTRAVENOUS | Status: DC | PRN

## 2015-04-08 MED ORDER — SODIUM CHLORIDE 0.9 % IJ SOLN: 3.0000 mL | INTRAMUSCULAR | Status: DC | PRN

## 2015-04-08 MED ORDER — SODIUM CHLORIDE 0.9 % IV SOLN
INTRAVENOUS | Status: DC
  Administered 2015-04-09: 08:00:00 via INTRAVENOUS

## 2015-04-08 NOTE — Progress Notes (Signed)
ANTICOAGULATION CONSULT NOTE - Follow Up Consult  Pharmacy Consult for  Heparin Indication: atrial fibrillation  Allergies  Allergen Reactions  . Niacin And Related Other (See Comments)    FLUSHING  . Penicillins Itching and Rash    Patient Measurements: Height: 5\' 10"  (177.8 cm) Weight: (!) 332 lb 12.8 oz (150.957 kg) IBW/kg (Calculated) : 73 Heparin Dosing Weight: 111 kg  Vital Signs: Temp: 98.4 F (36.9 C) (05/04 1131) Temp Source: Oral (05/04 1131) BP: 134/85 mmHg (05/04 0400) Pulse Rate: 83 (05/04 0905)  Labs:  Recent Labs  04/05/15 1900 04/05/15 2133 04/06/15 0321  04/06/15 2233 04/07/15 0428 04/08/15 0410  HGB 14.0  --   --   --   --  13.7 13.9  HCT 42.5  --   --   --   --  42.5 43.3  PLT 201  --   --   --   --  197 210  APTT  --   --   --   < > 78* 92* 95*  LABPROT 15.9*  --   --   --   --   --   --   INR 1.25  --   --   --   --   --   --   HEPARINUNFRC 1.21*  --   --   < > 0.81* 0.81* 0.76*  CREATININE 1.55*  --  1.54*  --   --  1.29* 1.24  TROPONINI 0.03 0.03 0.03  --   --   --   --   < > = values in this interval not displayed.  Estimated Creatinine Clearance: 88.7 mL/min (by C-G formula based on Cr of 1.24).  Assessment:   Hx afib with cardioversion x 2 in February and again 04/02/15.   Home Xarelto on hold for cath when able to lie flat.  Last Xarelto dose 04/05/15 am.   Heparin level is just above goal at 0.76, but likely still slightly influenced by recent Xarelto. APTT ramins on target at 95 seconds.  Goal of Therapy:  INR 2-3 aPTT 66-102 seconds Monitor platelets by anticoagulation protocol: Yes   Plan:   Continue heparin at 1800 units/hr.  Daily heparin level, aPTT and CBC.  Expect Xarelto to resume after cath.  Dennie Fetters, [place] Pager: [phone] 04/08/2015,12:56 PM

## 2015-04-08 NOTE — Progress Notes (Signed)
Ref: Default, Provider, MD   Subjective:  Feeling better. No chest pain. Improving orthopnea. Afebrile.   Objective:  Vital Signs in the last 24 hours: Temp:  [98 F (36.7 C)-98.9 F (37.2 C)] 98.6 F (37 C) (05/04 1634) Pulse Rate:  [70-86] 80 (05/04 1635) Cardiac Rhythm:  [-] Normal sinus rhythm (05/04 1630) Resp:  [17-25] 22 (05/04 1635) BP: (105-137)/(61-85) 119/77 mmHg (05/04 1635) SpO2:  [95 %-98 %] 95 % (05/04 1635) Weight:  [150.957 kg (332 lb 12.8 oz)] 150.957 kg (332 lb 12.8 oz) (05/04 0400)  Physical Exam: BP Readings from Last 1 Encounters:  04/08/15 119/77    Wt Readings from Last 1 Encounters:  04/08/15 150.957 kg (332 lb 12.8 oz)    Weight change: -1.633 kg (-3 lb 9.6 oz)  HEENT: [place]/AT, Eyes- PERL, EOMI, Conjunctiva-Pink, Sclera-Non-icteric Neck: No JVD, No bruit, Trachea midline. Lungs:  Clearing, Bilateral. Cardiac:  Regular rhythm, normal S1 and S2, no S3.  Abdomen:  Soft, non-tender. Extremities:  2 + edema present. No cyanosis. No clubbing. CNS: AxOx3, Cranial nerves grossly intact, moves all 4 extremities. Right handed. Skin: Warm and dry.   Intake/Output from previous day: 05/03 0701 - 05/04 0700 In: 962 [P.O.:605; I.V.:357] Out: 3000 [Urine:3000]    Lab Results: BMET    Component Value Date/Time   NA 141 04/08/2015 0410   NA 143 04/07/2015 0428   NA 142 04/06/2015 0321   K 3.9 04/08/2015 0410   K 3.7 04/07/2015 0428   K 3.5 04/06/2015 0321   CL 103 04/08/2015 0410   CL 101 04/07/2015 0428   CL 100* 04/06/2015 0321   CO2 24 04/08/2015 0410   CO2 30 04/07/2015 0428   CO2 32 04/06/2015 0321   GLUCOSE 94 04/08/2015 0410   GLUCOSE 102* 04/07/2015 0428   GLUCOSE 107* 04/06/2015 0321   GLUCOSE 99 09/12/2006 1309   BUN 23* 04/08/2015 0410   BUN 25* 04/07/2015 0428   BUN 27* 04/06/2015 0321   CREATININE 1.24 04/08/2015 0410   CREATININE 1.29* 04/07/2015 0428   CREATININE 1.54* 04/06/2015 0321   CALCIUM 9.0 04/08/2015 0410   CALCIUM  9.0 04/07/2015 0428   CALCIUM 8.9 04/06/2015 0321   GFRNONAA 60* 04/08/2015 0410   GFRNONAA 57* 04/07/2015 0428   GFRNONAA 46* 04/06/2015 0321   GFRAA >60 04/08/2015 0410   GFRAA >60 04/07/2015 0428   GFRAA 53* 04/06/2015 0321   CBC    Component Value Date/Time   WBC 6.8 04/08/2015 0410   RBC 4.80 04/08/2015 0410   HGB 13.9 04/08/2015 0410   HCT 43.3 04/08/2015 0410   PLT 210 04/08/2015 0410   MCV 90.2 04/08/2015 0410   MCH 29.0 04/08/2015 0410   MCHC 32.1 04/08/2015 0410   RDW 15.0 04/08/2015 0410   LYMPHSABS 1.7 04/05/2015 1900   MONOABS 0.6 04/05/2015 1900   EOSABS 0.2 04/05/2015 1900   BASOSABS 0.0 04/05/2015 1900   HEPATIC Function Panel  Recent Labs  01/13/15 0120 04/05/15 1900  PROT 5.9* 6.3*   HEMOGLOBIN A1C No components found for: HGA1C,  MPG CARDIAC ENZYMES Lab Results  Component Value Date   TROPONINI 0.03 04/06/2015   TROPONINI 0.03 04/05/2015   TROPONINI 0.03 04/05/2015   BNP No results for input(s): PROBNP in the last 8760 hours. TSH  Recent Labs  01/14/15 1140 04/05/15 1900  TSH 3.727 5.204*   CHOLESTEROL No results for input(s): CHOL in the last 8760 hours.  Scheduled Meds: . amiodarone  200 mg Oral Daily  .  aspirin EC  81 mg Oral Daily  . carvedilol  3.125 mg Oral BID WC  . cholecalciferol  1,000 Units Oral Daily  . furosemide  40 mg Intravenous BID  . potassium chloride SA  40 mEq Oral Daily  . pravastatin  40 mg Oral Daily  . sodium chloride  3 mL Intravenous Q12H   Continuous Infusions: . heparin 1,800 Units/hr (04/07/15 1800)  . nitroGLYCERIN 5 mcg/min (04/07/15 1800)   PRN Meds:.sodium chloride, acetaminophen, nitroGLYCERIN, ondansetron (ZOFRAN) IV, sodium chloride  Assessment/Plan: Acute on chronic left heart systolic failure. Atrial fibrillation, recurrent. Morbid obesity Hyperlipidemia Hypertension Hypokalemia Prolonged QTc. GERD  R + L heart cath in AM.     LOS: 3 days    Orpah Cobb  MD  04/08/2015,  6:40 PM

## 2015-04-09 ENCOUNTER — Encounter (HOSPITAL_COMMUNITY)
Admission: EM | Disposition: A | Discharge status: Skilled Nursing Facility | Payer: Non-veteran care | Source: Home / Self Care | Attending: Surgery

## 2015-04-09 HISTORY — PX: CARDIAC CATHETERIZATION: SHX172

## 2015-04-09 LAB — POCT I-STAT 3, VENOUS BLOOD GAS (G3P V)
ACID-BASE EXCESS: 1 mmol/L (ref 0.0–2.0)
Bicarbonate: 27.3 mEq/L — ABNORMAL HIGH (ref 20.0–24.0)
O2 SAT: 62 %
PCO2 VEN: 46.6 mmHg (ref 45.0–50.0)
TCO2: 29 mmol/L (ref 0–100)
pH, Ven: 7.376 — ABNORMAL HIGH (ref 7.250–7.300)
pO2, Ven: 33 mmHg (ref 30.0–45.0)

## 2015-04-09 LAB — CBC
HCT: 43.2 % (ref 39.0–52.0)
Hemoglobin: 14.1 g/dL (ref 13.0–17.0)
MCH: 29.4 pg (ref 26.0–34.0)
MCHC: 32.6 g/dL (ref 30.0–36.0)
MCV: 90 fL (ref 78.0–100.0)
Platelets: 203 10*3/uL (ref 150–400)
RBC: 4.8 MIL/uL (ref 4.22–5.81)
RDW: 14.8 % (ref 11.5–15.5)
WBC: 7.8 10*3/uL (ref 4.0–10.5)

## 2015-04-09 LAB — POCT I-STAT 3, ART BLOOD GAS (G3+)
Bicarbonate: 25.5 mEq/L — ABNORMAL HIGH (ref 20.0–24.0)
O2 Saturation: 94 %
PH ART: 7.393 (ref 7.350–7.450)
TCO2: 27 mmol/L (ref 0–100)
pCO2 arterial: 41.9 mmHg (ref 35.0–45.0)
pO2, Arterial: 72 mmHg — ABNORMAL LOW (ref 80.0–100.0)

## 2015-04-09 LAB — PROTIME-INR
INR: 1.06 (ref 0.00–1.49)
PROTHROMBIN TIME: 14 s (ref 11.6–15.2)

## 2015-04-09 LAB — POCT ACTIVATED CLOTTING TIME: Activated Clotting Time: 110 seconds

## 2015-04-09 LAB — HEPARIN LEVEL (UNFRACTIONATED): Heparin Unfractionated: 0.64 IU/mL (ref 0.30–0.70)

## 2015-04-09 LAB — APTT: aPTT: 91 seconds — ABNORMAL HIGH (ref 24–37)

## 2015-04-09 SURGERY — RIGHT/LEFT HEART CATH AND CORONARY ANGIOGRAPHY
Anesthesia: LOCAL

## 2015-04-09 MED ORDER — SODIUM CHLORIDE 0.9 % IV SOLN
250.0000 mL | INTRAVENOUS | Status: DC | PRN
  Administered 2015-04-10: 500 mL via INTRAVENOUS

## 2015-04-09 MED ORDER — SODIUM CHLORIDE 0.9 % IJ SOLN: 3.0000 mL | INTRAMUSCULAR | Status: DC | PRN

## 2015-04-09 MED ORDER — FENTANYL CITRATE (PF) 100 MCG/2ML IJ SOLN
INTRAMUSCULAR | Status: AC
  Filled 2015-04-09: qty 2

## 2015-04-09 MED ORDER — SODIUM CHLORIDE 0.9 % IJ SOLN
3.0000 mL | Freq: Two times a day (BID) | INTRAMUSCULAR | Status: DC
  Administered 2015-04-11 – 2015-04-12 (×2): 3 mL via INTRAVENOUS

## 2015-04-09 MED ORDER — IOHEXOL 350 MG/ML SOLN
INTRAVENOUS | Status: DC | PRN
  Administered 2015-04-09: 80 mL via INTRACARDIAC

## 2015-04-09 MED ORDER — SODIUM CHLORIDE 0.9 % IJ SOLN
3.0000 mL | Freq: Two times a day (BID) | INTRAMUSCULAR | Status: DC
  Administered 2015-04-10 – 2015-04-12 (×4): 3 mL via INTRAVENOUS

## 2015-04-09 MED ORDER — HEPARIN (PORCINE) IN NACL 2-0.9 UNIT/ML-% IJ SOLN
INTRAMUSCULAR | Status: AC
  Filled 2015-04-09: qty 1000

## 2015-04-09 MED ORDER — MIDAZOLAM HCL 2 MG/2ML IJ SOLN
INTRAMUSCULAR | Status: AC
  Filled 2015-04-09: qty 2

## 2015-04-09 MED ORDER — LIDOCAINE HCL (PF) 1 % IJ SOLN
INTRAMUSCULAR | Status: AC
  Filled 2015-04-09: qty 30

## 2015-04-09 MED ORDER — SODIUM CHLORIDE 0.9 % IV SOLN
250.0000 mL | INTRAVENOUS | Status: DC | PRN
  Administered 2015-04-13: 10:00:00 via INTRAVENOUS

## 2015-04-09 MED ORDER — FENTANYL CITRATE (PF) 100 MCG/2ML IJ SOLN
INTRAMUSCULAR | Status: DC | PRN
  Administered 2015-04-09 (×2): 25 ug via INTRAVENOUS

## 2015-04-09 MED ORDER — HEPARIN (PORCINE) IN NACL 100-0.45 UNIT/ML-% IJ SOLN
1700.0000 [IU]/h | INTRAMUSCULAR | Status: DC
  Administered 2015-04-10 (×2): 1700 [IU]/h via INTRAVENOUS
  Filled 2015-04-09 (×2): qty 250

## 2015-04-09 MED ORDER — MIDAZOLAM HCL 2 MG/2ML IJ SOLN
INTRAMUSCULAR | Status: DC | PRN
  Administered 2015-04-09 (×2): 1 mg via INTRAVENOUS

## 2015-04-09 SURGICAL SUPPLY — 11 items
CATH INFINITI 5FR MULTPACK ANG (CATHETERS) ×2 IMPLANT
CATH SWAN GANZ 7F STRAIGHT (CATHETERS) ×2 IMPLANT
KIT HEART LEFT (KITS) ×2 IMPLANT
KIT HEART RIGHT NAMIC (KITS) ×2 IMPLANT
NEEDLE SMART REG 18GX2-3/4 (NEEDLE) ×2 IMPLANT
PACK CARDIAC CATHETERIZATION (CUSTOM PROCEDURE TRAY) ×2 IMPLANT
SHEATH PINNACLE 5F 10CM (SHEATH) ×2 IMPLANT
SHEATH PINNACLE 7F 10CM (SHEATH) ×2 IMPLANT
SYR MEDRAD MARK V 150ML (SYRINGE) ×2 IMPLANT
TRANSDUCER W/STOPCOCK (MISCELLANEOUS) ×6 IMPLANT
WIRE EMERALD 3MM-J .035X150CM (WIRE) ×2 IMPLANT

## 2015-04-09 NOTE — H&P (View-Only) (Signed)
Ref: Default, Provider, MD   Subjective:  Feeling better. No chest pain. Improving orthopnea. Afebrile.   Objective:  Vital Signs in the last 24 hours: Temp:  [98 F (36.7 C)-98.9 F (37.2 C)] 98.6 F (37 C) (05/04 1634) Pulse Rate:  [70-86] 80 (05/04 1635) Cardiac Rhythm:  [-] Normal sinus rhythm (05/04 1630) Resp:  [17-25] 22 (05/04 1635) BP: (105-137)/(61-85) 119/77 mmHg (05/04 1635) SpO2:  [95 %-98 %] 95 % (05/04 1635) Weight:  [150.957 kg (332 lb 12.8 oz)] 150.957 kg (332 lb 12.8 oz) (05/04 0400)  Physical Exam: BP Readings from Last 1 Encounters:  04/08/15 119/77    Wt Readings from Last 1 Encounters:  04/08/15 150.957 kg (332 lb 12.8 oz)    Weight change: -1.633 kg (-3 lb 9.6 oz)  HEENT: [place]/AT, Eyes- PERL, EOMI, Conjunctiva-Pink, Sclera-Non-icteric Neck: No JVD, No bruit, Trachea midline. Lungs:  Clearing, Bilateral. Cardiac:  Regular rhythm, normal S1 and S2, no S3.  Abdomen:  Soft, non-tender. Extremities:  2 + edema present. No cyanosis. No clubbing. CNS: AxOx3, Cranial nerves grossly intact, moves all 4 extremities. Right handed. Skin: Warm and dry.   Intake/Output from previous day: 05/03 0701 - 05/04 0700 In: 962 [P.O.:605; I.V.:357] Out: 3000 [Urine:3000]    Lab Results: BMET    Component Value Date/Time   NA 141 04/08/2015 0410   NA 143 04/07/2015 0428   NA 142 04/06/2015 0321   K 3.9 04/08/2015 0410   K 3.7 04/07/2015 0428   K 3.5 04/06/2015 0321   CL 103 04/08/2015 0410   CL 101 04/07/2015 0428   CL 100* 04/06/2015 0321   CO2 24 04/08/2015 0410   CO2 30 04/07/2015 0428   CO2 32 04/06/2015 0321   GLUCOSE 94 04/08/2015 0410   GLUCOSE 102* 04/07/2015 0428   GLUCOSE 107* 04/06/2015 0321   GLUCOSE 99 09/12/2006 1309   BUN 23* 04/08/2015 0410   BUN 25* 04/07/2015 0428   BUN 27* 04/06/2015 0321   CREATININE 1.24 04/08/2015 0410   CREATININE 1.29* 04/07/2015 0428   CREATININE 1.54* 04/06/2015 0321   CALCIUM 9.0 04/08/2015 0410   CALCIUM  9.0 04/07/2015 0428   CALCIUM 8.9 04/06/2015 0321   GFRNONAA 60* 04/08/2015 0410   GFRNONAA 57* 04/07/2015 0428   GFRNONAA 46* 04/06/2015 0321   GFRAA >60 04/08/2015 0410   GFRAA >60 04/07/2015 0428   GFRAA 53* 04/06/2015 0321   CBC    Component Value Date/Time   WBC 6.8 04/08/2015 0410   RBC 4.80 04/08/2015 0410   HGB 13.9 04/08/2015 0410   HCT 43.3 04/08/2015 0410   PLT 210 04/08/2015 0410   MCV 90.2 04/08/2015 0410   MCH 29.0 04/08/2015 0410   MCHC 32.1 04/08/2015 0410   RDW 15.0 04/08/2015 0410   LYMPHSABS 1.7 04/05/2015 1900   MONOABS 0.6 04/05/2015 1900   EOSABS 0.2 04/05/2015 1900   BASOSABS 0.0 04/05/2015 1900   HEPATIC Function Panel  Recent Labs  01/13/15 0120 04/05/15 1900  PROT 5.9* 6.3*   HEMOGLOBIN A1C No components found for: HGA1C,  MPG CARDIAC ENZYMES Lab Results  Component Value Date   TROPONINI 0.03 04/06/2015   TROPONINI 0.03 04/05/2015   TROPONINI 0.03 04/05/2015   BNP No results for input(s): PROBNP in the last 8760 hours. TSH  Recent Labs  01/14/15 1140 04/05/15 1900  TSH 3.727 5.204*   CHOLESTEROL No results for input(s): CHOL in the last 8760 hours.  Scheduled Meds: . amiodarone  200 mg Oral Daily  .   aspirin EC  81 mg Oral Daily  . carvedilol  3.125 mg Oral BID WC  . cholecalciferol  1,000 Units Oral Daily  . furosemide  40 mg Intravenous BID  . potassium chloride SA  40 mEq Oral Daily  . pravastatin  40 mg Oral Daily  . sodium chloride  3 mL Intravenous Q12H   Continuous Infusions: . heparin 1,800 Units/hr (04/07/15 1800)  . nitroGLYCERIN 5 mcg/min (04/07/15 1800)   PRN Meds:.sodium chloride, acetaminophen, nitroGLYCERIN, ondansetron (ZOFRAN) IV, sodium chloride  Assessment/Plan: Acute on chronic left heart systolic failure. Atrial fibrillation, recurrent. Morbid obesity Hyperlipidemia Hypertension Hypokalemia Prolonged QTc. GERD  R + L heart cath in AM.     LOS: 3 days    Densel Kronick  MD  04/08/2015,  6:40 PM      

## 2015-04-09 NOTE — Progress Notes (Addendum)
ANTICOAGULATION CONSULT NOTE - Follow Up Consult  Pharmacy Consult for  Heparin Indication: atrial fibrillation  Allergies  Allergen Reactions  . Niacin And Related Other (See Comments)    FLUSHING  . Penicillins Itching and Rash    Patient Measurements: Height: 5\' 10"  (177.8 cm) Weight: (!) 332 lb 14.4 oz (151.002 kg) IBW/kg (Calculated) : 73 Heparin Dosing Weight: 111 kg  Vital Signs: Temp: 97.8 F (36.6 C) (05/05 1353) Temp Source: Oral (05/05 1353) BP: 113/65 mmHg (05/05 1353) Pulse Rate: 83 (05/05 1353)  Labs:  Recent Labs  04/07/15 0428 04/08/15 0410 04/09/15 0339 04/09/15 0500  HGB 13.7 13.9 14.1  --   HCT 42.5 43.3 43.2  --   PLT 197 210 203  --   APTT 92* 95* 91*  --   LABPROT  --   --  14.0  --   INR  --   --  1.06  --   HEPARINUNFRC 0.81* 0.76*  --  0.64  CREATININE 1.29* 1.24  --   --     Estimated Creatinine Clearance: 88.7 mL/min (by C-G formula based on Cr of 1.24).  Assessment: 64 yo male s/p cath to re-start heparin 8 hours post sheath removal (sheath removal at about 5pm today).  He is also noted on Xarelto PTA for afib (cardioversion x 2 in February and again 04/02/15). Spoke with Dr. [name] and plans for possible CABG.   -Patient was on heparin prior to procedure at 1800 units/hr. Last heparin level was 0.64 and aPTT was 91 which appear to be correlating (Last Xarelto dose was 04/05/15)    Goal of Therapy:  Heparin level = 0.3-0.7 Monitor platelets by anticoagulation protocol: Yes   Plan:  -Restart heparin at 1700 units/hr 8 hours post sheath removal (begin at 1am on 04/10/15) -Heparin level in 8 hrs with aptt -Daily Heparin level and CBC   Harland German, Pharm D 04/09/2015 5:12 PM

## 2015-04-09 NOTE — Plan of Care (Signed)
Problem: Phase III Progression Outcomes Goal: Pain controlled on oral analgesia Outcome: Not Applicable Date Met:  17/53/01 Patient has denied pain thus far this shift.

## 2015-04-09 NOTE — Progress Notes (Signed)
Pt refusing cpap for the night, RT informed pt to call for RT if he changes his mind

## 2015-04-09 NOTE — Plan of Care (Signed)
Problem: Phase II Progression Outcomes Goal: Barriers To Progression Addressed/Resolved Outcome: Not Progressing Patient is not coping with the plan of having a CABG. He states "He will not have the surgery, he wants to be home for mother's day." RN asked if patient would like to speak to someone (Chaplain, etc.) Patient stated he would talk to his pastor later this evening. Will continue to monitor.

## 2015-04-09 NOTE — Interval H&P Note (Signed)
History and Physical Interval Note:  04/09/2015 3:24 PM  Charles Mckinney  has presented today for surgery, with the diagnosis of cp  The various methods of treatment have been discussed with the patient and family. After consideration of risks, benefits and other options for treatment, the patient has consented to  Procedure(s): Right/Left Heart Cath and Coronary Angiography (N/A) as a surgical intervention .  The patient's history has been reviewed, patient examined, no change in status, stable for surgery.  I have reviewed the patient's chart and labs.  Questions were answered to the patient's satisfaction.     [name]

## 2015-04-09 NOTE — Plan of Care (Signed)
Problem: Phase I Progression Outcomes Goal: Pain controlled with appropriate interventions Outcome: Not Applicable Date Met:  00/51/10 Patient denies pain thus far this shift.

## 2015-04-10 ENCOUNTER — Encounter (HOSPITAL_COMMUNITY): Payer: Self-pay | Admitting: Cardiovascular Disease

## 2015-04-10 ENCOUNTER — Other Ambulatory Visit: Payer: Self-pay | Admitting: *Deleted

## 2015-04-10 ENCOUNTER — Encounter (HOSPITAL_COMMUNITY): Payer: Non-veteran care

## 2015-04-10 DIAGNOSIS — I2510 Atherosclerotic heart disease of native coronary artery without angina pectoris: Secondary | ICD-10-CM

## 2015-04-10 LAB — BASIC METABOLIC PANEL
Anion gap: 10 (ref 5–15)
BUN: 22 mg/dL — AB (ref 6–20)
CO2: 26 mmol/L (ref 22–32)
Calcium: 9.1 mg/dL (ref 8.9–10.3)
Chloride: 103 mmol/L (ref 101–111)
Creatinine, Ser: 1.17 mg/dL (ref 0.61–1.24)
GFR calc Af Amer: >60 mL/min (ref >60)
GFR calc non Af Amer: >60 mL/min (ref >60)
Glucose, Bld: 105 mg/dL — ABNORMAL HIGH (ref 70–99)
Potassium: 4 mmol/L (ref 3.5–5.1)
SODIUM: 139 mmol/L (ref 135–145)

## 2015-04-10 LAB — HEPARIN LEVEL (UNFRACTIONATED)
Heparin Unfractionated: 0.18 IU/mL — ABNORMAL LOW (ref 0.30–0.70)
Heparin Unfractionated: 0.44 IU/mL (ref 0.30–0.70)

## 2015-04-10 LAB — CBC
HCT: 44.5 % (ref 39.0–52.0)
HEMOGLOBIN: 14.3 g/dL (ref 13.0–17.0)
MCH: 28.8 pg (ref 26.0–34.0)
MCHC: 32.1 g/dL (ref 30.0–36.0)
MCV: 89.7 fL (ref 78.0–100.0)
PLATELETS: 197 10*3/uL (ref 150–400)
RBC: 4.96 MIL/uL (ref 4.22–5.81)
RDW: 14.7 % (ref 11.5–15.5)
WBC: 7.6 10*3/uL (ref 4.0–10.5)

## 2015-04-10 LAB — TYPE AND SCREEN
ABO/RH(D): A NEG
Antibody Screen: NEGATIVE

## 2015-04-10 LAB — APTT: APTT: 46 s — AB (ref 24–37)

## 2015-04-10 LAB — ABO/RH: ABO/RH(D): A NEG

## 2015-04-10 MED ORDER — HEPARIN (PORCINE) IN NACL 100-0.45 UNIT/ML-% IJ SOLN
1800.0000 [IU]/h | INTRAMUSCULAR | Status: DC
  Administered 2015-04-10 – 2015-04-13 (×4): 1800 [IU]/h via INTRAVENOUS
  Filled 2015-04-10 (×4): qty 250

## 2015-04-10 MED FILL — Heparin Sodium (Porcine) 2 Unit/ML in Sodium Chloride 0.9%: INTRAMUSCULAR | Qty: 1000 | Status: AC

## 2015-04-10 MED FILL — Lidocaine HCl Local Preservative Free (PF) Inj 1%: INTRAMUSCULAR | Qty: 30 | Status: AC

## 2015-04-10 NOTE — Progress Notes (Signed)
Received call from the [facility] transfer coordinator ( [phone] ext [phone]) wanting the patient to transfer to the [facility] for continuation of medical care. CM talked to patient's nurse, patient is very stressed at this time and considering surgery. CM to follow up with Joey after the patient decides what to do; Abelino Derrick RN,BSN,MHA [phone]

## 2015-04-10 NOTE — Progress Notes (Signed)
ANTICOAGULATION CONSULT NOTE - Follow Up Consult  Pharmacy Consult:  Heparin Indication: atrial fibrillation  Allergies  Allergen Reactions  . Niacin And Related Other (See Comments)    FLUSHING  . Penicillins Itching and Rash    Patient Measurements: Height: 5\' 10"  (177.8 cm) Weight: (!) 335 lb 9.6 oz (152.227 kg) IBW/kg (Calculated) : 73 Heparin Dosing Weight: 111 kg  Vital Signs: Temp: 99 F (37.2 C) (05/06 0750) Temp Source: Oral (05/06 0750) BP: 124/66 mmHg (05/06 0125) Pulse Rate: 76 (05/06 0125)  Labs:  Recent Labs  04/08/15 0410 04/09/15 0339 04/09/15 0500 04/10/15 0302 04/10/15 0743  HGB 13.9 14.1  --  14.3  --   HCT 43.3 43.2  --  44.5  --   PLT 210 203  --  197  --   APTT 95* 91*  --   --  46*  LABPROT  --  14.0  --   --   --   INR  --  1.06  --   --   --   HEPARINUNFRC 0.76*  --  0.64  --  0.18*  CREATININE 1.24  --   --  1.17  --     Estimated Creatinine Clearance: 94.5 mL/min (by C-G formula based on Cr of 1.17).    Assessment: 64 yo male s/p cath to continue on IV heparin while Xarelto remains on hold for possible CABG.  Patient has a history of Afib (cardioversion x 2 in February and again 04/02/15).  Heparin level sub-therapeutic and is correlating with aPTT.  No bleeding reported.    Goal of Therapy:  Heparin level = 0.3-0.7 units/mL Monitor platelets by anticoagulation protocol: Yes    Plan:  - Increase heparin gtt to 1800 units/hr - Check 6 hr HL - Daily HL / CBC    Jaclyn Andy D. Laney Potash, PharmD, BCPS Pager:  [phone] 04/10/2015, 9:34 AM

## 2015-04-10 NOTE — Progress Notes (Signed)
CM talked to Attending MD Dr [name] about possible transfere to the [facility], per Md the patient can be transferred to the [facility] if they have a bed available.  CM talked to patient about transferring to the [place], patient had lots of questions. TCT Joey ( Clinical cytogeneticist with the [place]) to talk to the patient about the [place] process....Marland KitchenMarland Kitchenpatient is refusing to sign any paperwork at this time to be transferred and patient wants to stay at [facility] for his procedures and possible surgery. Abelino Derrick RN,BSN,MHA [phone]

## 2015-04-10 NOTE — Progress Notes (Signed)
Ref: Default, Provider, MD   Subjective:  Patient aware of need for CABG but wants high risk stenting here or at [facility], [place], [place]. [place] facility notified. Patient made aware of high risk transfer with IV heparin and IV NTG. No right groin hematoma. No chest pain.   Objective:  Vital Signs in the last 24 hours: Temp:  [98.3 F (36.8 C)-99 F (37.2 C)] 98.4 F (36.9 C) (05/06 1155) Pulse Rate:  [0-96] 83 (05/06 1155) Cardiac Rhythm:  [-] Normal sinus rhythm (05/06 1159) Resp:  [0-36] 23 (05/06 1155) BP: (104-150)/(65-107) 109/70 mmHg (05/06 1155) SpO2:  [0 %-98 %] 93 % (05/06 1155) Weight:  [152.227 kg (335 lb 9.6 oz)] 152.227 kg (335 lb 9.6 oz) (05/06 0500)  Physical Exam: BP Readings from Last 1 Encounters:  04/10/15 109/70    Wt Readings from Last 1 Encounters:  04/10/15 152.227 kg (335 lb 9.6 oz)    Weight change: 1.225 kg (2 lb 11.2 oz)  HEENT: /AT, Eyes-[place], PERL, EOMI, Conjunctiva-Pink, Sclera-Non-icteric Neck: No JVD, No bruit, Trachea midline. Lungs:  Clearing, Bilateral. Cardiac:  Regular rhythm, normal S1 and S2, no S3. II/VI systolic murmur. Abdomen:  Soft, non-tender. Extremities:  1 + edema present. No cyanosis. No clubbing. CNS: AxOx3, Cranial nerves grossly intact, moves all 4 extremities. Right handed. Skin: Warm and dry.   Intake/Output from previous day: 05/05 0701 - 05/06 0700 In: 1147.2 [P.O.:240; I.V.:907.2] Out: 1550 [Urine:1550]    Lab Results: BMET    Component Value Date/Time   NA 139 04/10/2015 0302   NA 141 04/08/2015 0410   NA 143 04/07/2015 0428   K 4.0 04/10/2015 0302   K 3.9 04/08/2015 0410   K 3.7 04/07/2015 0428   CL 103 04/10/2015 0302   CL 103 04/08/2015 0410   CL 101 04/07/2015 0428   CO2 26 04/10/2015 0302   CO2 24 04/08/2015 0410   CO2 30 04/07/2015 0428   GLUCOSE 105* 04/10/2015 0302   GLUCOSE 94 04/08/2015 0410   GLUCOSE 102* 04/07/2015 0428   GLUCOSE 99 09/12/2006 1309   BUN 22* 04/10/2015 0302   BUN 23* 04/08/2015  0410   BUN 25* 04/07/2015 0428   CREATININE 1.17 04/10/2015 0302   CREATININE 1.24 04/08/2015 0410   CREATININE 1.29* 04/07/2015 0428   CALCIUM 9.1 04/10/2015 0302   CALCIUM 9.0 04/08/2015 0410   CALCIUM 9.0 04/07/2015 0428   GFRNONAA >60 04/10/2015 0302   GFRNONAA 60* 04/08/2015 0410   GFRNONAA 57* 04/07/2015 0428   GFRAA >60 04/10/2015 0302   GFRAA >60 04/08/2015 0410   GFRAA >60 04/07/2015 0428   CBC    Component Value Date/Time   WBC 7.6 04/10/2015 0302   RBC 4.96 04/10/2015 0302   HGB 14.3 04/10/2015 0302   HCT 44.5 04/10/2015 0302   PLT 197 04/10/2015 0302   MCV 89.7 04/10/2015 0302   MCH 28.8 04/10/2015 0302   MCHC 32.1 04/10/2015 0302   RDW 14.7 04/10/2015 0302   LYMPHSABS 1.7 04/05/2015 1900   MONOABS 0.6 04/05/2015 1900   EOSABS 0.2 04/05/2015 1900   BASOSABS 0.0 04/05/2015 1900   HEPATIC Function Panel  Recent Labs  01/13/15 0120 04/05/15 1900  PROT 5.9* 6.3*   HEMOGLOBIN A1C No components found for: HGA1C,  MPG CARDIAC ENZYMES Lab Results  Component Value Date   TROPONINI 0.03 04/06/2015   TROPONINI 0.03 04/05/2015   TROPONINI 0.03 04/05/2015   BNP No results for input(s): PROBNP in the last 8760 hours. TSH  Recent  Labs  01/14/15 1140 04/05/15 1900  TSH 3.727 5.204*   CHOLESTEROL No results for input(s): CHOL in the last 8760 hours.  Scheduled Meds: . amiodarone  200 mg Oral Daily  . aspirin EC  81 mg Oral Daily  . carvedilol  3.125 mg Oral BID WC  . cholecalciferol  1,000 Units Oral Daily  . furosemide  40 mg Intravenous BID  . potassium chloride SA  40 mEq Oral Daily  . pravastatin  40 mg Oral Daily  . sodium chloride  3 mL Intravenous Q12H  . sodium chloride  3 mL Intravenous Q12H  . sodium chloride  3 mL Intravenous Q12H   Continuous Infusions: . heparin 1,800 Units/hr (04/10/15 1124)  . nitroGLYCERIN 10 mcg/min (04/10/15 0717)   PRN Meds:.sodium chloride, sodium chloride, sodium chloride, acetaminophen, nitroGLYCERIN,  ondansetron (ZOFRAN) IV, sodium chloride, sodium chloride, sodium chloride  Assessment/Plan: Acute on chronic left heart systolic failure. Severe 3 Vessel coronary artery disease Atrial fibrillation, recurrent. Morbid obesity Hyperlipidemia Hypertension Hypokalemia Prolonged QTc. GERD  Possible stenting here or at [facility]. [place] facility aware of possible transfer to their facility ASAP.     LOS: 5 days    Orpah Cobb  MD  04/10/2015, 3:50 PM

## 2015-04-10 NOTE — Progress Notes (Signed)
ANTICOAGULATION CONSULT NOTE - Follow Up Consult  Pharmacy Consult:  Heparin Indication: atrial fibrillation  Allergies  Allergen Reactions  . Niacin And Related Other (See Comments)    FLUSHING  . Penicillins Itching and Rash    Patient Measurements: Height: 5\' 10"  (177.8 cm) Weight: (!) 335 lb 9.6 oz (152.227 kg) IBW/kg (Calculated) : 73 Heparin Dosing Weight: 111 kg  Vital Signs: Temp: 98.4 F (36.9 C) (05/06 1155) Temp Source: Oral (05/06 1155) BP: 116/69 mmHg (05/06 1721) Pulse Rate: 91 (05/06 1721)  Labs:  Recent Labs  04/08/15 0410 04/09/15 0339 04/09/15 0500 04/10/15 0302 04/10/15 0743 04/10/15 1656  HGB 13.9 14.1  --  14.3  --   --   HCT 43.3 43.2  --  44.5  --   --   PLT 210 203  --  197  --   --   APTT 95* 91*  --   --  46*  --   LABPROT  --  14.0  --   --   --   --   INR  --  1.06  --   --   --   --   HEPARINUNFRC 0.76*  --  0.64  --  0.18* 0.44  CREATININE 1.24  --   --  1.17  --   --     Estimated Creatinine Clearance: 94.5 mL/min (by C-G formula based on Cr of 1.17).    Assessment: 64 yo male s/p cath to continue on IV heparin while Xarelto remains on hold for possible CABG.  Patient has a history of Afib (cardioversion x 2 in February and again 04/02/15).  No bleeding reported.  Heparin level therapeutic   Goal of Therapy:  Heparin level = 0.3-0.7 units/mL Monitor platelets by anticoagulation protocol: Yes    Plan:  - Continue heparin gtt at 1800 units/hr - Daily HL / CBC  Thank you Okey Regal, PharmD [phone]   04/10/2015, 5:34 PM

## 2015-04-11 ENCOUNTER — Inpatient Hospital Stay (HOSPITAL_COMMUNITY): Payer: Non-veteran care

## 2015-04-11 LAB — COMPREHENSIVE METABOLIC PANEL
ALT: 23 U/L (ref 17–63)
ANION GAP: 10 (ref 5–15)
AST: 24 U/L (ref 15–41)
Albumin: 3.5 g/dL (ref 3.5–5.0)
Alkaline Phosphatase: 58 U/L (ref 38–126)
BUN: 24 mg/dL — ABNORMAL HIGH (ref 6–20)
CO2: 27 mmol/L (ref 22–32)
Calcium: 9 mg/dL (ref 8.9–10.3)
Chloride: 100 mmol/L — ABNORMAL LOW (ref 101–111)
Creatinine, Ser: 1.29 mg/dL — ABNORMAL HIGH (ref 0.61–1.24)
GFR calc Af Amer: >60 mL/min (ref >60)
GFR calc non Af Amer: 57 mL/min — ABNORMAL LOW (ref >60)
Glucose, Bld: 110 mg/dL — ABNORMAL HIGH (ref 70–99)
POTASSIUM: 3.6 mmol/L (ref 3.5–5.1)
SODIUM: 137 mmol/L (ref 135–145)
TOTAL PROTEIN: 6.5 g/dL (ref 6.5–8.1)
Total Bilirubin: 0.7 mg/dL (ref 0.3–1.2)

## 2015-04-11 LAB — URINE MICROSCOPIC-ADD ON

## 2015-04-11 LAB — CBC
HCT: 42.6 % (ref 39.0–52.0)
Hemoglobin: 14 g/dL (ref 13.0–17.0)
MCH: 29 pg (ref 26.0–34.0)
MCHC: 32.9 g/dL (ref 30.0–36.0)
MCV: 88.2 fL (ref 78.0–100.0)
PLATELETS: 185 10*3/uL (ref 150–400)
RBC: 4.83 MIL/uL (ref 4.22–5.81)
RDW: 14.6 % (ref 11.5–15.5)
WBC: 7.4 10*3/uL (ref 4.0–10.5)

## 2015-04-11 LAB — URINALYSIS, ROUTINE W REFLEX MICROSCOPIC
BILIRUBIN URINE: NEGATIVE
Glucose, UA: NEGATIVE mg/dL
KETONES UR: NEGATIVE mg/dL
LEUKOCYTES UA: NEGATIVE
NITRITE: NEGATIVE
Protein, ur: NEGATIVE mg/dL
Specific Gravity, Urine: 1.02 (ref 1.005–1.030)
UROBILINOGEN UA: 0.2 mg/dL (ref 0.0–1.0)
pH: 5 (ref 5.0–8.0)

## 2015-04-11 LAB — BLOOD GAS, ARTERIAL
Acid-Base Excess: 2.2 mmol/L — ABNORMAL HIGH (ref 0.0–2.0)
Bicarbonate: 26.1 mEq/L — ABNORMAL HIGH (ref 20.0–24.0)
Drawn by: 31101
FIO2: 0.21 %
O2 SAT: 94.3 %
PH ART: 7.439 (ref 7.350–7.450)
Patient temperature: 98.6
TCO2: 27.2 mmol/L (ref 0–100)
pCO2 arterial: 39.1 mmHg (ref 35.0–45.0)
pO2, Arterial: 72.7 mmHg — ABNORMAL LOW (ref 80.0–100.0)

## 2015-04-11 LAB — HEPARIN LEVEL (UNFRACTIONATED): HEPARIN UNFRACTIONATED: 0.42 [IU]/mL (ref 0.30–0.70)

## 2015-04-11 LAB — PREALBUMIN: Prealbumin: 33.2 mg/dL (ref 18–38)

## 2015-04-11 MED ORDER — ALBUTEROL SULFATE (2.5 MG/3ML) 0.083% IN NEBU: 2.5000 mg | INHALATION_SOLUTION | Freq: Four times a day (QID) | RESPIRATORY_TRACT | Status: DC | PRN

## 2015-04-11 MED ORDER — ALBUTEROL SULFATE (2.5 MG/3ML) 0.083% IN NEBU
2.5000 mg | INHALATION_SOLUTION | Freq: Four times a day (QID) | RESPIRATORY_TRACT | Status: DC
  Filled 2015-04-11: qty 3

## 2015-04-11 NOTE — Progress Notes (Signed)
ANTICOAGULATION CONSULT NOTE - Follow Up Consult  Pharmacy Consult for  Heparin Indication: atrial fibrillation and 3V CAD, awaiting CABG  Allergies  Allergen Reactions  . Niacin And Related Other (See Comments)    FLUSHING  . Penicillins Itching and Rash    Patient Measurements: Height: 5\' 10"  (177.8 cm) Weight: (!) 315 lb 14.4 oz (143.291 kg) IBW/kg (Calculated) : 73 Heparin Dosing Weight: 111 kg  Vital Signs: Temp: 98.6 F (37 C) (05/07 0820) Temp Source: Oral (05/07 0820) BP: 113/59 mmHg (05/07 0820) Pulse Rate: 94 (05/07 0820)  Labs:  Recent Labs  04/09/15 0339  04/10/15 0302 04/10/15 0743 04/10/15 1656 04/11/15 0608  HGB 14.1  --  14.3  --   --  14.0  HCT 43.2  --  44.5  --   --  42.6  PLT 203  --  197  --   --  185  APTT 91*  --   --  46*  --   --   LABPROT 14.0  --   --   --   --   --   INR 1.06  --   --   --   --   --   HEPARINUNFRC  --   < >  --  0.18* 0.44 0.42  CREATININE  --   --  1.17  --   --  1.29*  < > = values in this interval not displayed.  Estimated Creatinine Clearance: 82.7 mL/min (by C-G formula based on Cr of 1.29).  Assessment:   Hx afib with cardioversion x 2 in February and again 04/02/15.   Home Xarelto on hold for cath 04/09/15 and remains on hold for CABG.  Last Xarelto dose 04/05/15 am.     Heparin level remains therapeutic (0.42) on 1800 units/hr.  Goal of Therapy:  Heparin level 0.3-0.7 Monitor platelets by anticoagulation protocol: Yes   Plan:   Continue heparin at 1800 units/hr.  Daily heparin level and CBC.  For CABG on 04/13/15.  Dennie Fetters, RPh Pager: [phone] 04/11/2015,2:45 PM

## 2015-04-11 NOTE — Consult Note (Signed)
Charles Mckinney       [place] [address]             [phone]      Cardiothoracic Surgery Consultation   Reason for Consult: High grade left main and multi-vessel coronary artery disease Referring Physician: Dr. Remus Blake is an 64 y.o. male.  HPI:  The patient is a 64 year old morbidly obese gentleman with hypertension, a history of congestive heart failure secondary to systolic dysfunction with a know EF of 30-35% by echo on 02/01/2014, atrial fibrillation s/p cardioversion x 2 in February and then at the end of April, hypercholesterolemia, DJD who was readmitted on 5/1 with worsening shortness of breath that woke him up early in the morning. He has had shortness of breath, orthopnea, PND and leg swelling for a while but no recent chest pain. On presentation he was hypoxic with sats in the 80's and responded to bipap and lasix. He is a English as a second language teacher and said that he was scheduled to see a cardiologist at the [place] but had not gone yet. CXR on admission showed congestive heart failure with increased interstitial edema. Echo on 4/27 showed  an EF of 30-35% with diffuse hypokinesis and severe hypokinesis of the mid-apical inferior myocardium. There was no AS or AI, no MR. Cardiac cath shows a 95% distal LM stenosis that extends into the LAD with 99% ostial LAD which is a smallish vessel. There is a diagonal with high grade ostial stenosis that is also on the small side.  It also extends into the ostial LCX with 90% stenosis and the LCX only has one OM branch that is small and probably not graftable. The RCA is a large dominant vessel with 80% proximal stenosis and 50% mid stenosis as well as 90% PDA stenosis. EF is about 25% with an LVEDP of 20. CI was 2.0-2.2. PAP was 37/11 with a wedge of 16.  Past Medical History  Diagnosis Date  . Hyperlipidemia   . Hypertensive heart disease   . Obesity (BMI 30-39.9)   . GERD (gastroesophageal reflux disease)   . Kidney stones    . Osteoarthritis   . A-fib   . CHF (congestive heart failure)   . PONV (postoperative nausea and vomiting)   . Hypertension   . Dysrhythmia     ATRIAL FIBRILATION  . Shortness of breath dyspnea   . Anxiety     Past Surgical History  Procedure Laterality Date  . Cataract extraction    . Cardioversion N/A 01/19/2015    Procedure: CARDIOVERSION;  Surgeon: Jacolyn Reedy, MD;  Location: [facility] ENDOSCOPY;  Service: Cardiovascular;  Laterality: N/A;  pt in Afib, 12:22 synched cardioversion @  120 joules using Propofol 100 mg,IV....unsuccessful, repeated at 200 joules  . Cardioversion N/A 04/02/2015    Procedure: CARDIOVERSION;  Surgeon: Dixie Dials, MD;  Location: [facility] ENDOSCOPY;  Service: Cardiovascular;  Laterality: N/A;  . Cardiac catheterization N/A 04/09/2015    Procedure: Right/Left Heart Cath and Coronary Angiography;  Surgeon: Dixie Dials, MD;  Location: [facility] INVASIVE CV LAB CUPID;  Service: Cardiovascular;  Laterality: N/A;    Family History  Problem Relation Age of Onset  . Diabetes Mother   . Heart disease Father   . Stroke Brother   . Heart disease Brother   . Diabetes Brother     Social History:  reports that he has quit smoking. He has never used smokeless tobacco. He reports that he does  not drink alcohol or use illicit drugs.  Allergies:  Allergies  Allergen Reactions  . Niacin And Related Other (See Comments)    FLUSHING  . Penicillins Itching and Rash    Medications:  I have reviewed the patient's current medications. Prior to Admission:  Prescriptions prior to admission  Medication Sig Dispense Refill Last Dose  . acetaminophen (TYLENOL) 500 MG tablet Take 1,000 mg by mouth every 6 (six) hours as needed for moderate pain.   Past Week at Unknown time  . amiodarone (PACERONE) 200 MG tablet Take 1 tablet (200 mg total) by mouth 2 (two) times daily. 60 tablet 0 04/05/2015 at Unknown time  . aspirin 81 MG EC tablet Take 1 tablet (81 mg total) by mouth daily. Swallow  whole. (Patient taking differently: Take 81 mg by mouth daily as needed for pain. Swallow whole.) 30 tablet 12 Past Week at Unknown time  . carvedilol (COREG) 12.5 MG tablet Take 1 tablet (12.5 mg total) by mouth 2 (two) times daily with a meal. (Patient taking differently: Take 25 mg by mouth 2 (two) times daily with a meal. ) 60 tablet 0 04/05/2015 at 0600  . cholecalciferol (VITAMIN D) 1000 UNITS tablet Take 1,000 Units by mouth daily.   04/05/2015 at Unknown time  . colchicine 0.6 MG tablet Take 1 tablet (0.6 mg total) by mouth daily. 10 tablet 0 04/04/2015 at Unknown time  . fluticasone (FLONASE) 50 MCG/ACT nasal spray Place 1 spray into both nostrils daily.   04/03/2015 at Unknown time  . furosemide (LASIX) 40 MG tablet Take 1 tablet (40 mg total) by mouth 2 (two) times daily. 60 tablet 12 04/05/2015 at Unknown time  . lisinopril (PRINIVIL,ZESTRIL) 2.5 MG tablet Take 1 tablet (2.5 mg total) by mouth daily. 30 tablet 0 04/05/2015 at Unknown time  . nitroGLYCERIN (NITROSTAT) 0.4 MG SL tablet Place 1 tablet (0.4 mg total) under the tongue every 5 (five) minutes as needed for chest pain. 25 tablet 12 04/05/2015 at Unknown time  . potassium chloride SA (K-DUR,KLOR-CON) 20 MEQ tablet Take 2 tablets (40 mEq total) by mouth daily. 60 tablet 12 04/05/2015 at Unknown time  . pravastatin (PRAVACHOL) 40 MG tablet Take 1 tablet (40 mg total) by mouth daily. 90 tablet 3 04/05/2015 at Unknown time  . rivaroxaban (XARELTO) 20 MG TABS tablet Take 1 tablet (20 mg total) by mouth daily with supper. 30 tablet 12 04/05/2015 at Unknown time   Scheduled: . amiodarone  200 mg Oral Daily  . aspirin EC  81 mg Oral Daily  . carvedilol  3.125 mg Oral BID WC  . cholecalciferol  1,000 Units Oral Daily  . furosemide  40 mg Intravenous BID  . potassium chloride SA  40 mEq Oral Daily  . pravastatin  40 mg Oral Daily  . sodium chloride  3 mL Intravenous Q12H  . sodium chloride  3 mL Intravenous Q12H  . sodium chloride  3 mL Intravenous  Q12H   Continuous: . heparin 1,800 Units/hr (04/10/15 2305)  . nitroGLYCERIN 10 mcg/min (04/10/15 0717)   BTD:VVOHYW chloride, sodium chloride, sodium chloride, acetaminophen, nitroGLYCERIN, ondansetron (ZOFRAN) IV, sodium chloride, sodium chloride, sodium chloride Anti-infectives    None      Results for orders placed or performed during the hospital encounter of 04/05/15 (from the past 48 hour(s))  I-STAT 3, venous blood gas (G3P V)     Status: Abnormal   Collection Time: 04/09/15  4:11 PM  Result Value Ref Range   pH,  Ven 7.376 (H) 7.250 - 7.300   pCO2, Ven 46.6 45.0 - 50.0 mmHg   pO2, Ven 33.0 30.0 - 45.0 mmHg   Bicarbonate 27.3 (H) 20.0 - 24.0 mEq/L   TCO2 29 0 - 100 mmol/L   O2 Saturation 62.0 %   Acid-Base Excess 1.0 0.0 - 2.0 mmol/L   Sample type VENOUS    Comment NOTIFIED PHYSICIAN   I-STAT 3, arterial blood gas (G3+)     Status: Abnormal   Collection Time: 04/09/15  4:12 PM  Result Value Ref Range   pH, Arterial 7.393 7.350 - 7.450   pCO2 arterial 41.9 35.0 - 45.0 mmHg   pO2, Arterial 72.0 (L) 80.0 - 100.0 mmHg   Bicarbonate 25.5 (H) 20.0 - 24.0 mEq/L   TCO2 27 0 - 100 mmol/L   O2 Saturation 94.0 %   Sample type ARTERIAL   POCT Activated clotting time     Status: None   Collection Time: 04/09/15  4:37 PM  Result Value Ref Range   Activated Clotting Time 110 seconds  CBC     Status: None   Collection Time: 04/10/15  3:02 AM  Result Value Ref Range   WBC 7.6 4.0 - 10.5 K/uL   RBC 4.96 4.22 - 5.81 MIL/uL   Hemoglobin 14.3 13.0 - 17.0 g/dL   HCT 44.5 39.0 - 52.0 %   MCV 89.7 78.0 - 100.0 fL   MCH 28.8 26.0 - 34.0 pg   MCHC 32.1 30.0 - 36.0 g/dL   RDW 14.7 11.5 - 15.5 %   Platelets 197 150 - 400 K/uL  Basic metabolic panel     Status: Abnormal   Collection Time: 04/10/15  3:02 AM  Result Value Ref Range   Sodium 139 135 - 145 mmol/L   Potassium 4.0 3.5 - 5.1 mmol/L   Chloride 103 101 - 111 mmol/L   CO2 26 22 - 32 mmol/L   Glucose, Bld 105 (H) 70 - 99  mg/dL   BUN 22 (H) 6 - 20 mg/dL   Creatinine, Ser 1.17 0.61 - 1.24 mg/dL   Calcium 9.1 8.9 - 10.3 mg/dL   GFR calc non Af Amer >60 >60 mL/min   GFR calc Af Amer >60 >60 mL/min    Comment: (NOTE) The eGFR has been calculated using the CKD EPI equation. This calculation has not been validated in all clinical situations. eGFR's persistently <60 mL/min signify possible Chronic Kidney Disease.    Anion gap 10 5 - 15  Heparin level (unfractionated)     Status: Abnormal   Collection Time: 04/10/15  7:43 AM  Result Value Ref Range   Heparin Unfractionated 0.18 (L) 0.30 - 0.70 IU/mL    Comment:        IF HEPARIN RESULTS ARE BELOW EXPECTED VALUES, AND PATIENT DOSAGE HAS BEEN CONFIRMED, SUGGEST FOLLOW UP TESTING OF ANTITHROMBIN III LEVELS.   APTT     Status: Abnormal   Collection Time: 04/10/15  7:43 AM  Result Value Ref Range   aPTT 46 (H) 24 - 37 seconds    Comment:        IF BASELINE aPTT IS ELEVATED, SUGGEST PATIENT RISK ASSESSMENT BE USED TO DETERMINE APPROPRIATE ANTICOAGULANT THERAPY.   Heparin level (unfractionated)     Status: None   Collection Time: 04/10/15  4:56 PM  Result Value Ref Range   Heparin Unfractionated 0.44 0.30 - 0.70 IU/mL    Comment:        IF HEPARIN RESULTS ARE BELOW  EXPECTED VALUES, AND PATIENT DOSAGE HAS BEEN CONFIRMED, SUGGEST FOLLOW UP TESTING OF ANTITHROMBIN III LEVELS.   Type and screen     Status: None   Collection Time: 04/10/15  8:54 PM  Result Value Ref Range   ABO/RH(D) A NEG    Antibody Screen NEG    Sample Expiration 04/13/2015   ABO/Rh     Status: None   Collection Time: 04/10/15  8:54 PM  Result Value Ref Range   ABO/RH(D) A NEG   Urinalysis, Routine w reflex microscopic     Status: Abnormal   Collection Time: 04/10/15 11:07 PM  Result Value Ref Range   Color, Urine AMBER (A) YELLOW    Comment: BIOCHEMICALS MAY BE AFFECTED BY COLOR   APPearance CLOUDY (A) CLEAR   Specific Gravity, Urine 1.020 1.005 - 1.030   pH 5.0 5.0 - 8.0    Glucose, UA NEGATIVE NEGATIVE mg/dL   Hgb urine dipstick LARGE (A) NEGATIVE   Bilirubin Urine NEGATIVE NEGATIVE   Ketones, ur NEGATIVE NEGATIVE mg/dL   Protein, ur NEGATIVE NEGATIVE mg/dL   Urobilinogen, UA 0.2 0.0 - 1.0 mg/dL   Nitrite NEGATIVE NEGATIVE   Leukocytes, UA NEGATIVE NEGATIVE  Urine microscopic-add on     Status: None   Collection Time: 04/10/15 11:07 PM  Result Value Ref Range   Squamous Epithelial / LPF RARE RARE   WBC, UA 0-2 <3 WBC/hpf   RBC / HPF 11-20 <3 RBC/hpf   Bacteria, UA RARE RARE  Blood gas, arterial     Status: Abnormal   Collection Time: 04/11/15  2:40 AM  Result Value Ref Range   FIO2 0.21 %   Delivery systems ROOM AIR    pH, Arterial 7.439 7.350 - 7.450   pCO2 arterial 39.1 35.0 - 45.0 mmHg   pO2, Arterial 72.7 (L) 80.0 - 100.0 mmHg   Bicarbonate 26.1 (H) 20.0 - 24.0 mEq/L   TCO2 27.2 0 - 100 mmol/L   Acid-Base Excess 2.2 (H) 0.0 - 2.0 mmol/L   O2 Saturation 94.3 %   Patient temperature 98.6    Collection site RIGHT RADIAL    Drawn by [id number]    Sample type ARTERIAL DRAW    Allens test (pass/fail) PASS PASS  CBC     Status: None   Collection Time: 04/11/15  6:08 AM  Result Value Ref Range   WBC 7.4 4.0 - 10.5 K/uL   RBC 4.83 4.22 - 5.81 MIL/uL   Hemoglobin 14.0 13.0 - 17.0 g/dL   HCT 42.6 39.0 - 52.0 %   MCV 88.2 78.0 - 100.0 fL   MCH 29.0 26.0 - 34.0 pg   MCHC 32.9 30.0 - 36.0 g/dL   RDW 14.6 11.5 - 15.5 %   Platelets 185 150 - 400 K/uL  Heparin level (unfractionated)     Status: None   Collection Time: 04/11/15  6:08 AM  Result Value Ref Range   Heparin Unfractionated 0.42 0.30 - 0.70 IU/mL    Comment:        IF HEPARIN RESULTS ARE BELOW EXPECTED VALUES, AND PATIENT DOSAGE HAS BEEN CONFIRMED, SUGGEST FOLLOW UP TESTING OF ANTITHROMBIN III LEVELS.   Comprehensive metabolic panel     Status: Abnormal   Collection Time: 04/11/15  6:08 AM  Result Value Ref Range   Sodium 137 135 - 145 mmol/L   Potassium 3.6 3.5 - 5.1 mmol/L    Chloride 100 (L) 101 - 111 mmol/L   CO2 27 22 - 32 mmol/L  Glucose, Bld 110 (H) 70 - 99 mg/dL   BUN 24 (H) 6 - 20 mg/dL   Creatinine, Ser 1.29 (H) 0.61 - 1.24 mg/dL   Calcium 9.0 8.9 - 10.3 mg/dL   Total Protein 6.5 6.5 - 8.1 g/dL   Albumin 3.5 3.5 - 5.0 g/dL   AST 24 15 - 41 U/L   ALT 23 17 - 63 U/L   Alkaline Phosphatase 58 38 - 126 U/L   Total Bilirubin 0.7 0.3 - 1.2 mg/dL   GFR calc non Af Amer 57 (L) >60 mL/min   GFR calc Af Amer >60 >60 mL/min    Comment: (NOTE) The eGFR has been calculated using the CKD EPI equation. This calculation has not been validated in all clinical situations. eGFR's persistently <60 mL/min signify possible Chronic Kidney Disease.    Anion gap 10 5 - 15  Prealbumin     Status: None   Collection Time: 04/11/15  6:08 AM  Result Value Ref Range   Prealbumin 33.2 18 - 38 mg/dL    Dg Chest 2 View  04/11/2015   CLINICAL DATA:  Congestive heart failure  EXAM: CHEST  2 VIEW  COMPARISON:  04/05/2015  FINDINGS: There is moderate cardiomegaly. There is significant improvement, with clearance of much of the vascular and interstitial congestive changes and clearance of much of the alveolar edema. Only a minimal degree of atelectatic appearing basilar linear opacity persists. There are no effusions.  IMPRESSION: Improved   Electronically Signed   By: Andreas Newport M.D.   On: 04/11/2015 07:08    Review of Systems  Constitutional: Positive for malaise/fatigue. Negative for fever, chills, weight loss and diaphoresis.  HENT: Negative.   Eyes: Negative.   Respiratory: Positive for shortness of breath. Negative for cough, hemoptysis and sputum production.   Cardiovascular: Positive for palpitations, orthopnea, leg swelling and PND. Negative for chest pain.  Gastrointestinal: Negative.   Genitourinary: Negative.   Musculoskeletal: Positive for joint pain.  Skin: Negative.   Neurological: Negative for dizziness and focal weakness.  Endo/Heme/Allergies: Negative.    Psychiatric/Behavioral: Negative.    Blood pressure 116/67, pulse 86, temperature 99.1 F (37.3 C), temperature source Oral, resp. rate 15, height _0  (1.778 m), weight 143.291 kg (315 lb 14.4 oz), SpO2 96 %. Physical Exam  Constitutional: He is oriented to person, place, and time.  Morbidly obese gentleman in no distress.  HENT:  Head: Normocephalic and atraumatic.  Mouth/Throat: Oropharynx is clear and moist.  Poor dentition  Eyes: EOM are normal. Pupils are equal, round, and reactive to light.  Neck: Normal range of motion. Neck supple. No JVD present. No thyromegaly present.  Cardiovascular: Normal rate, regular rhythm and normal heart sounds.   No murmur heard. Respiratory: Effort normal and breath sounds normal. No respiratory distress.  Rales in lung bases  GI: There is no tenderness.  Morbidly obese with panniculus  Musculoskeletal:  Large legs with no significant edema  Neurological: He is alert and oriented to person, place, and time. He has normal strength. No cranial nerve deficit or sensory deficit.  Skin: Skin is warm and dry.  Psychiatric: He has a normal mood and affect.                 *[facility]*            [place] [address]  [place], [place] [address]              [phone]  ------------------------------------------------------------------- Echocardiography  Patient:  Avontae, Burkhead MR #:    [id number] Study Date: 04/01/2015 Gender:   M Age:    3 Height:   177.8 cm Weight:   151.5 kg BSA:    2.82 m^2 Pt. Status: Room:    3E07C  ADMITTING  Dixie Dials, MD ATTENDING  Dixie Dials, MD ORDERING   Dixie Dials, MD PERFORMING  Dixie Dials, MD REFERRING  Dixie Dials, MD SONOGRAPHER Joanie Coddington, RDCS  cc:  ------------------------------------------------------------------- LV EF: 30% -   35%  ------------------------------------------------------------------- Indications:   CHF - 428.0. Obesity, morbid 278.01.  ------------------------------------------------------------------- History:  Risk factors: Hypertension. Dyslipidemia.  ------------------------------------------------------------------- Study Conclusions  - Left ventricle: The cavity size was normal. There was mild concentric hypertrophy. Systolic function was moderately to severely reduced. The estimated ejection fraction was in the range of 30% to 35%. Diffuse hypokinesis. There is severe hypokinesis of the mid-apical inferior myocardium. - Mitral valve: Calcified annulus. - Left atrium: The atrium was moderately dilated.  Echocardiography. M-mode, limited 2D, limited spectral Doppler, and color Doppler. Birthdate: Patient birthdate: Apr 08, 1951. Age: Patient is 64 yr old. Sex: Gender: male.  BMI: 47.9 kg/m^2. Blood pressure:   96/77 Patient status: Inpatient. Study date: Study date: 04/01/2015. Study time: 02:54 PM. Location: Bedside.  -------------------------------------------------------------------  ------------------------------------------------------------------- Left ventricle: The cavity size was normal. There was mild concentric hypertrophy. Systolic function was moderately to severely reduced. The estimated ejection fraction was in the range of 30% to 35%. Diffuse hypokinesis. Regional wall motion abnormalities: There is severe hypokinesis of the mid-apical inferior myocardium.  ------------------------------------------------------------------- Aortic valve:  Trileaflet; normal thickness leaflets. Mobility was not restricted. Doppler: Transvalvular velocity was within the normal range. There was no stenosis. There was no regurgitation.  ------------------------------------------------------------------- Aorta: Aortic root: The aortic root was  mildly dilated. Ascending aorta: The ascending aorta was mildly dilated.  ------------------------------------------------------------------- Mitral valve:  Calcified annulus. Mobility was not restricted. Doppler: Transvalvular velocity was within the normal range. There was no evidence for stenosis. There was no regurgitation.  ------------------------------------------------------------------- Left atrium: The atrium was moderately dilated.  ------------------------------------------------------------------- Right ventricle: The cavity size was normal. Wall thickness was normal. The moderator band was prominent. Systolic function was normal.  ------------------------------------------------------------------- Pulmonic valve:  The valve appears to be grossly normal. Doppler: Transvalvular velocity was within the normal range. There was no evidence for stenosis.  ------------------------------------------------------------------- Tricuspid valve:  Structurally normal valve.  Doppler: Transvalvular velocity was within the normal range. There was no regurgitation.  ------------------------------------------------------------------- Pulmonary artery:  The main pulmonary artery was normal-sized. Systolic pressure was within the normal range.  ------------------------------------------------------------------- Right atrium: The atrium was normal in size.  ------------------------------------------------------------------- Pericardium: There was no pericardial effusion.  ------------------------------------------------------------------- Systemic veins: Inferior vena cava: The vessel was dilated. The respirophasic diameter changes were blunted (< 50%), consistent with elevated central venous pressure.  ------------------------------------------------------------------- Measurements  Left ventricle              Value    Reference LV ID, ED, PLAX  chordal    (H)    54.7 mm   43 - 52 LV ID, ES, PLAX chordal    (H)    44.5 mm   23 - 38 LV fx shortening, PLAX chordal (L)    19  %   >=29 LV PW thickness, ED           12.7 mm   ---------  IVS/LV PW ratio, ED           0.99     <=1.3  Ventricular septum            Value    Reference IVS thickness, ED            12.6 mm   ---------  Aorta                  Value    Reference Aortic root ID, ED            43  mm   ---------  Left atrium               Value    Reference LA ID, A-P, ES              45  mm   --------- LA ID/bsa, A-P              1.6  cm/m^2 <=2.2  Legend: (L) and (H) mark values outside specified reference range.  ------------------------------------------------------------------- Prepared and Electronically Authenticated by  Dixie Dials, MD 2016-04-27T18:19:24    LM lesion, 95% stenosed.  Ost LAD lesion, 99% stenosed.  Ost Cx lesion, 90% stenosed.  Prox Cx lesion, 50% stenosed.  Mid Cx lesion, 70% stenosed.  Prox LAD to Dist LAD lesion, 80% stenosed.  1st Diag-1 lesion, 99% stenosed.  1st Diag-2 lesion, 80% stenosed.  Prox RCA lesion, 80% stenosed.  RPDA-1 lesion, 80% stenosed.  RPDA-2 lesion, 90% stenosed.  Mid RCA lesion, 50% stenosed.  Left internal mammary artery injection showed patent LIMA.  Patient will be admitted to step down unit with IV NTG and referred to CVTS surgeon for possible CABG here or at [facility] facility. Will hold Xarelto for now and continue IV heparin.     Coronary Findings    Dominance: Right   Left Main   . LM lesion, 95% stenosed. The lesion is type C, discrete located proximal to major branch. The lesion was not previously treated. Pressure wire/FFR was not performed on the lesionIVUS was not performed on the lesion. At distal  end of LM involving origin of major branches.     Left Anterior Descending   . Ost LAD lesion, 99% stenosed. The lesion is type C, discrete . The lesion was not previously treated. Pressure wire/FFR was not performed on the lesionIVUS was not performed on the lesion.   . Prox LAD to Dist LAD lesion, 80% stenosed. The lesion is type C, diffuse . The lesion was not previously treated. Pressure wire/FFR was not performed on the lesionIVUS was not performed on the lesion. Diffuse 80 % narrowing post 2nd septal perforator branch and 60 % from diagonal 1 origin to 2nd septal perforator.   . First Diagonal Branch   . 1st Diag-1 lesion, 99% stenosed. The lesion is type non-C, discrete located at bend. The lesion was not previously treated. Pressure wire/FFR was not performed on the lesionIVUS was not performed on the lesion.   . 1st Diag-2 lesion, 80% stenosed. The lesion is type non-C, diffuse . The lesion was not previously treated. Pressure wire/FFR was not performed on the lesionIVUS was not performed on the lesion.     Left Circumflex   . Ost Cx lesion, 90% stenosed. The lesion is type C, discrete . The lesion was not previously treated. Pressure wire/FFR was not performed on the lesionIVUS was not performed on the lesion.   . Prox Cx lesion, 50% stenosed. The  lesion is type non-C, diffuse . The lesion was not previously treated. Pressure wire/FFR was not performed on the lesionIVUS was not performed on the lesion.   . Mid Cx lesion, 70% stenosed. The lesion is type C, diffuse . The lesion was not previously treated. Pressure wire/FFR was not performed on the lesionIVUS was not performed on the lesion.     Right Coronary Artery   . Prox RCA lesion, 80% stenosed. The lesion is type non-C, discrete located at bend. The lesion was not previously treated. Pressure wire/FFR was not performed on the lesionIVUS was not performed on the lesion.   . Mid RCA lesion, 50% stenosed. The lesion is type non-C,  discrete . The lesion was not previously treated. Pressure wire/FFR was not performed on the lesionIVUS was not performed on the lesion.   . Right Posterior Descending Artery   . RPDA-1 lesion, 80% stenosed. The lesion is type non-C, discrete . The lesion was not previously treated. Pressure wire/FFR was not performed on the lesionIVUS was not performed on the lesion.   Marland Kitchen RPDA-2 lesion, 90% stenosed. The lesion is type C, diffuse . The lesion was not previously treated. Pressure wire/FFR was not performed on the lesionIVUS was not performed on the lesion.       Right Heart Pressures Elevated LV EDP consistent with volume overload.    Wall Motion       EF 20-25 %            Left Heart    Left Ventricle The left ventricle is enlarged. There is severe left ventricular systolic dysfunction. The left ventricular ejection fraction is less than 25% by visual estimate. There are wall motion abnormalities in the left ventricle. There is global hypokinesis of the left ventricle.    Coronary Diagrams    Diagnostic Diagram            Hemo Data     Site Time Sys Dis Mean Wave EDP Output Output Ind Gradient O2 Sat   LV 4:08 PM 130 mmHg   7 mmHg     17 mmHg         FICK 3:11 PM      5.61 L/min   2.17 (L/min)/BSA       Therm 3:11 PM      5.31 L/min   2.05 (L/min)/BSA       RA 4:14 PM    9 mmHg  8 mmHg          RV 4:14 PM 34 mmHg   2 mmHg     8 mmHg         AOP 4:18 PM 133 mmHg   83 mmHg   104 mmHg           LVP 4:17 PM 131 mmHg   0 mmHg     20 mmHg         AO 4:20 PM 129 mmHg   83 mmHg   102 mmHg           PW 4:03 PM   16 mmHg   21 mmHg  19 mmHg           4:08 PM   14 mmHg   19 mmHg  16 mmHg          PA 4:03 PM 37 mmHg   11 mmHg   24 mmHg            Assessment/Plan:  He has high grade left main  and severe 3-vessel coronary disease with severe LV dysfunction presenting with acute on chronic systolic congestive heart failure.  He had a BNP of 793 with negative troponin. I agree that CABG is the best treatment for him although his LCX is small and may not be suitable for grafting. His LAD and Diagonal are on the small side but are probably graftable and the RCA is a large dominant vessel that is graftable. His operative risk is elevated due to his poor EF and morbid obesity. He has not had PFT's yet but he is likely to have significant abnormalities due to his obesity. I discussed the operative procedure with the patient including alternatives, benefits and risks; including but not limited to bleeding, blood transfusion, infection, stroke, myocardial infarction, graft failure, heart block requiring a permanent pacemaker, organ dysfunction, and death. He keeps says that he wants a stent but I have explained to him that his left main and ostial LAD/LCX disease is not amenable to a stent and his risk of death is high without surgical treatment. He also says that he thinks he will have to be transferred to a [place] facility for treatment although that may be risky with high grade LM stenosis. If he decides that he wants CABG at [facility] I will plan to do on Monday.  Gaye Pollack, MD  04/10/2015

## 2015-04-11 NOTE — Progress Notes (Signed)
Ref: Default, Provider, MD   Subjective:  Feeling better. Awaiting CABG on Monday. Chgest x-ray improved aeration. T max 99 degree F  Objective:  Vital Signs in the last 24 hours: Temp:  [98.6 F (37 C)-99.1 F (37.3 C)] 99 F (37.2 C) (05/07 1323) Pulse Rate:  [84-94] 84 (05/07 1323) Cardiac Rhythm:  [-] Normal sinus rhythm (05/07 0454) Resp:  [15-24] 24 (05/07 1323) BP: (107-116)/(59-69) 111/60 mmHg (05/07 1323) SpO2:  [94 %-99 %] 99 % (05/07 1323) Weight:  [143.291 kg (315 lb 14.4 oz)] 143.291 kg (315 lb 14.4 oz) (05/07 0454)  Physical Exam: BP Readings from Last 1 Encounters:  04/11/15 111/60    Wt Readings from Last 1 Encounters:  04/11/15 143.291 kg (315 lb 14.4 oz)    Weight change: -8.936 kg (-19 lb 11.2 oz)  HEENT: [place]/AT, Eyes-Hazel, PERL, EOMI, Conjunctiva-Pink, Sclera-Non-icteric Neck: No JVD, No bruit, Trachea midline. Lungs:  Clear, Bilateral. Cardiac:  Regular rhythm, normal S1 and S2, no S3. II/VI systolic murmur. Abdomen:  Soft, non-tender. Extremities:  1 + edema present. No cyanosis. No clubbing. CNS: AxOx3, Cranial nerves grossly intact, moves all 4 extremities. Right handed. Skin: Warm and dry.   Intake/Output from previous day: 05/06 0701 - 05/07 0700 In: 612 [P.O.:240; I.V.:372] Out: 1400 [Urine:1400]    Lab Results: BMET    Component Value Date/Time   NA 137 04/11/2015 0608   NA 139 04/10/2015 0302   NA 141 04/08/2015 0410   K 3.6 04/11/2015 0608   K 4.0 04/10/2015 0302   K 3.9 04/08/2015 0410   CL 100* 04/11/2015 0608   CL 103 04/10/2015 0302   CL 103 04/08/2015 0410   CO2 27 04/11/2015 0608   CO2 26 04/10/2015 0302   CO2 24 04/08/2015 0410   GLUCOSE 110* 04/11/2015 0608   GLUCOSE 105* 04/10/2015 0302   GLUCOSE 94 04/08/2015 0410   GLUCOSE 99 09/12/2006 1309   BUN 24* 04/11/2015 0608   BUN 22* 04/10/2015 0302   BUN 23* 04/08/2015 0410   CREATININE 1.29* 04/11/2015 0608   CREATININE 1.17 04/10/2015 0302   CREATININE 1.24  04/08/2015 0410   CALCIUM 9.0 04/11/2015 0608   CALCIUM 9.1 04/10/2015 0302   CALCIUM 9.0 04/08/2015 0410   GFRNONAA 57* 04/11/2015 0608   GFRNONAA >60 04/10/2015 0302   GFRNONAA 60* 04/08/2015 0410   GFRAA >60 04/11/2015 0608   GFRAA >60 04/10/2015 0302   GFRAA >60 04/08/2015 0410   CBC    Component Value Date/Time   WBC 7.4 04/11/2015 0608   RBC 4.83 04/11/2015 0608   HGB 14.0 04/11/2015 0608   HCT 42.6 04/11/2015 0608   PLT 185 04/11/2015 0608   MCV 88.2 04/11/2015 0608   MCH 29.0 04/11/2015 0608   MCHC 32.9 04/11/2015 0608   RDW 14.6 04/11/2015 0608   LYMPHSABS 1.7 04/05/2015 1900   MONOABS 0.6 04/05/2015 1900   EOSABS 0.2 04/05/2015 1900   BASOSABS 0.0 04/05/2015 1900   HEPATIC Function Panel  Recent Labs  01/13/15 0120 04/05/15 1900 04/11/15 0608  PROT 5.9* 6.3* 6.5   HEMOGLOBIN A1C No components found for: HGA1C,  MPG CARDIAC ENZYMES Lab Results  Component Value Date   TROPONINI 0.03 04/06/2015   TROPONINI 0.03 04/05/2015   TROPONINI 0.03 04/05/2015   BNP No results for input(s): PROBNP in the last 8760 hours. TSH  Recent Labs  01/14/15 1140 04/05/15 1900  TSH 3.727 5.204*   CHOLESTEROL No results for input(s): CHOL in the last 8760 hours.  Scheduled Meds: . albuterol  2.5 mg Nebulization Q6H  . amiodarone  200 mg Oral Daily  . aspirin EC  81 mg Oral Daily  . carvedilol  3.125 mg Oral BID WC  . cholecalciferol  1,000 Units Oral Daily  . furosemide  40 mg Intravenous BID  . potassium chloride SA  40 mEq Oral Daily  . pravastatin  40 mg Oral Daily  . sodium chloride  3 mL Intravenous Q12H  . sodium chloride  3 mL Intravenous Q12H  . sodium chloride  3 mL Intravenous Q12H   Continuous Infusions: . heparin 1,800 Units/hr (04/10/15 2305)  . nitroGLYCERIN 10 mcg/min (04/10/15 0717)   PRN Meds:.sodium chloride, sodium chloride, sodium chloride, acetaminophen, nitroGLYCERIN, ondansetron (ZOFRAN) IV, sodium chloride, sodium chloride, sodium  chloride  Assessment/Plan: Acute on chronic left heart systolic failure. Severe 3 Vessel coronary artery disease Atrial fibrillation, recurrent. Morbid obesity Hyperlipidemia Hypertension Hypokalemia Prolonged QTc. GERD  Accepts CABG here as transfer to [place] facility is very risky. Patient aware of incomplete and risky re-vasculariztion with stenting as compared to CABG.    LOS: 6 days    Orpah Cobb  MD  04/11/2015, 4:34 PM

## 2015-04-11 NOTE — Progress Notes (Signed)
Phase I Cardiac Rehab  Completed pre-op education with pt and significant other, handout given.  Encouraged ISP use and watching video prior to surgery.  We f/u after surgery next week. Fabio Pierce, MA, ACSM RCEP [phone]

## 2015-04-11 NOTE — Progress Notes (Signed)
      [address]       [name] [address]             [phone]     CARDIOTHORACIC SURGERY PROGRESS NOTE  2 Days Post-Op  S/P Procedure(s) (LRB): Right/Left Heart Cath and Coronary Angiography (N/A)  Subjective: Denies chest pain or SOB  Objective: Vital signs in last 24 hours: Temp:  [98.4 F (36.9 C)-99.1 F (37.3 C)] 98.6 F (37 C) (05/07 0820) Pulse Rate:  [83-94] 94 (05/07 0820) Cardiac Rhythm:  [-] Normal sinus rhythm (05/07 0454) Resp:  [15-23] 18 (05/07 0820) BP: (107-116)/(59-70) 113/59 mmHg (05/07 0820) SpO2:  [93 %-97 %] 96 % (05/07 0820) Weight:  [143.291 kg (315 lb 14.4 oz)] 143.291 kg (315 lb 14.4 oz) (05/07 0454)  Physical Exam:  Rhythm:   sinus  Breath sounds: clear  Heart sounds:  RRR  Incisions:  n/a  Abdomen:  soft  Extremities:  warm   Intake/Output from previous day: 05/06 0701 - 05/07 0700 In: 612 [P.O.:240; I.V.:372] Out: 1400 [Urine:1400] Intake/Output this shift: Total I/O In: 360 [P.O.:360] Out: 101 [Urine:100; Stool:1]  Lab Results:  Recent Labs  04/10/15 0302 04/11/15 0608  WBC 7.6 7.4  HGB 14.3 14.0  HCT 44.5 42.6  PLT 197 185   BMET:  Recent Labs  04/10/15 0302 04/11/15 0608  NA 139 137  K 4.0 3.6  CL 103 100*  CO2 26 27  GLUCOSE 105* 110*  BUN 22* 24*  CREATININE 1.17 1.29*  CALCIUM 9.1 9.0    CBG (last 3)  No results for input(s): GLUCAP in the last 72 hours. PT/INR:   Recent Labs  04/09/15 0339  LABPROT 14.0  INR 1.06    EKG: NSR w/out acute ischemic changes    CXR:  CHEST 2 VIEW  COMPARISON: 04/05/2015  FINDINGS: There is moderate cardiomegaly. There is significant improvement, with clearance of much of the vascular and interstitial congestive changes and clearance of much of the alveolar edema. Only a minimal degree of atelectatic appearing basilar linear opacity persists. There are no effusions.  IMPRESSION: Improved   Electronically Signed  By: Ellery Plunk M.D.  On: 04/11/2015 07:08   Assessment/Plan: S/P Procedure(s) (LRB): Right/Left Heart Cath and Coronary Angiography (N/A)  Mr Bacus has decided that he wishes to proceed with CABG on Monday, although very reluctantly.  All questions answered  I spent in excess of 15 minutes during the conduct of this hospital encounter and >50% of this time involved direct face-to-face encounter with the patient for counseling and/or coordination of their care.   Purcell Nails 04/11/2015 11:24 AM

## 2015-04-12 ENCOUNTER — Encounter (HOSPITAL_COMMUNITY): Payer: Non-veteran care

## 2015-04-12 ENCOUNTER — Inpatient Hospital Stay (HOSPITAL_COMMUNITY): Payer: Non-veteran care

## 2015-04-12 DIAGNOSIS — I25110 Atherosclerotic heart disease of native coronary artery with unstable angina pectoris: Secondary | ICD-10-CM

## 2015-04-12 DIAGNOSIS — Z01810 Encounter for preprocedural cardiovascular examination: Secondary | ICD-10-CM

## 2015-04-12 LAB — BLOOD GAS, ARTERIAL
Acid-base deficit: 1 mmol/L (ref 0.0–2.0)
BICARBONATE: 23.2 meq/L (ref 20.0–24.0)
DRAWN BY: 307971
FIO2: 0.21 %
O2 SAT: 94.6 %
PH ART: 7.399 (ref 7.350–7.450)
Patient temperature: 98.6
TCO2: 24.4 mmol/L (ref 0–100)
pCO2 arterial: 38.3 mmHg (ref 35.0–45.0)
pO2, Arterial: 74.9 mmHg — ABNORMAL LOW (ref 80.0–100.0)

## 2015-04-12 LAB — CBC
HCT: 44.1 % (ref 39.0–52.0)
Hemoglobin: 14.5 g/dL (ref 13.0–17.0)
MCH: 29.1 pg (ref 26.0–34.0)
MCHC: 32.9 g/dL (ref 30.0–36.0)
MCV: 88.4 fL (ref 78.0–100.0)
Platelets: 196 K/uL (ref 150–400)
RBC: 4.99 MIL/uL (ref 4.22–5.81)
RDW: 14.7 % (ref 11.5–15.5)
WBC: 9.2 K/uL (ref 4.0–10.5)

## 2015-04-12 LAB — HEPARIN LEVEL (UNFRACTIONATED): Heparin Unfractionated: 0.37 [IU]/mL (ref 0.30–0.70)

## 2015-04-12 MED ORDER — SODIUM CHLORIDE 0.9 % IV SOLN: 250.0000 mL | INTRAVENOUS | Status: DC | PRN

## 2015-04-12 MED ORDER — TEMAZEPAM 15 MG PO CAPS
15.0000 mg | ORAL_CAPSULE | Freq: Once | ORAL | Status: AC | PRN
Start: 2015-04-12 — End: 2015-04-12
  Administered 2015-04-12: 15 mg via ORAL
  Filled 2015-04-12: qty 1

## 2015-04-12 MED ORDER — CHLORHEXIDINE GLUCONATE 4 % EX LIQD
60.0000 mL | Freq: Once | CUTANEOUS | Status: AC
  Administered 2015-04-13: 4 via TOPICAL
  Filled 2015-04-12: qty 60

## 2015-04-12 MED ORDER — POTASSIUM CHLORIDE 2 MEQ/ML IV SOLN
80.0000 meq | INTRAVENOUS | Status: DC
  Filled 2015-04-12: qty 40

## 2015-04-12 MED ORDER — DEXTROSE 5 % IV SOLN
30.0000 ug/min | INTRAVENOUS | Status: DC
  Filled 2015-04-12: qty 2

## 2015-04-12 MED ORDER — COLCHICINE 0.6 MG PO TABS
0.6000 mg | ORAL_TABLET | Freq: Every day | ORAL | Status: DC
  Administered 2015-04-12: 0.6 mg via ORAL
  Filled 2015-04-12: qty 1

## 2015-04-12 MED ORDER — POTASSIUM CHLORIDE CRYS ER 20 MEQ PO TBCR
40.0000 meq | EXTENDED_RELEASE_TABLET | Freq: Once | ORAL | Status: AC
  Administered 2015-04-12: 40 meq via ORAL
  Filled 2015-04-12: qty 2

## 2015-04-12 MED ORDER — DEXMEDETOMIDINE HCL IN NACL 400 MCG/100ML IV SOLN
0.1000 ug/kg/h | INTRAVENOUS | Status: AC
  Administered 2015-04-13: 16:00:00 via INTRAVENOUS
  Administered 2015-04-13: .2 ug/kg/h via INTRAVENOUS
  Filled 2015-04-12: qty 100

## 2015-04-12 MED ORDER — SODIUM CHLORIDE 0.9 % IV SOLN
INTRAVENOUS | Status: DC
  Filled 2015-04-12: qty 30

## 2015-04-12 MED ORDER — SODIUM CHLORIDE 0.9 % IV SOLN: INTRAVENOUS | Status: DC

## 2015-04-12 MED ORDER — DOPAMINE-DEXTROSE 3.2-5 MG/ML-% IV SOLN
0.0000 ug/kg/min | INTRAVENOUS | Status: AC
  Administered 2015-04-13: 3 ug/kg/min via INTRAVENOUS
  Filled 2015-04-12 (×2): qty 250

## 2015-04-12 MED ORDER — SODIUM CHLORIDE 0.9 % IV SOLN
INTRAVENOUS | Status: AC
  Administered 2015-04-13: 69 mL/h via INTRAVENOUS
  Administered 2015-04-13: 14:00:00 via INTRAVENOUS
  Filled 2015-04-12: qty 40

## 2015-04-12 MED ORDER — LEVOFLOXACIN IN D5W 500 MG/100ML IV SOLN
500.0000 mg | INTRAVENOUS | Status: AC
  Administered 2015-04-13: 500 mg via INTRAVENOUS
  Filled 2015-04-12 (×2): qty 100

## 2015-04-12 MED ORDER — CHLORHEXIDINE GLUCONATE 4 % EX LIQD
60.0000 mL | Freq: Once | CUTANEOUS | Status: AC
  Administered 2015-04-12: 4 via TOPICAL
  Filled 2015-04-12: qty 60

## 2015-04-12 MED ORDER — AZITHROMYCIN 250 MG PO TABS
250.0000 mg | ORAL_TABLET | Freq: Every day | ORAL | Status: DC
  Filled 2015-04-12: qty 1

## 2015-04-12 MED ORDER — MAGNESIUM SULFATE 50 % IJ SOLN
40.0000 meq | INTRAMUSCULAR | Status: DC
  Filled 2015-04-12: qty 10

## 2015-04-12 MED ORDER — AZITHROMYCIN 250 MG PO TABS
500.0000 mg | ORAL_TABLET | Freq: Every day | ORAL | Status: AC
  Administered 2015-04-12: 500 mg via ORAL
  Filled 2015-04-12: qty 2

## 2015-04-12 MED ORDER — SODIUM CHLORIDE 0.9 % IV SOLN
INTRAVENOUS | Status: AC
  Administered 2015-04-13: 1 [IU]/h via INTRAVENOUS
  Filled 2015-04-12: qty 2.5

## 2015-04-12 MED ORDER — NITROGLYCERIN IN D5W 200-5 MCG/ML-% IV SOLN
2.0000 ug/min | INTRAVENOUS | Status: DC
  Filled 2015-04-12 (×2): qty 250

## 2015-04-12 MED ORDER — OXYCODONE-ACETAMINOPHEN 5-325 MG PO TABS
1.0000 | ORAL_TABLET | Freq: Four times a day (QID) | ORAL | Status: DC | PRN
  Administered 2015-04-12: 1 via ORAL
  Filled 2015-04-12: qty 1

## 2015-04-12 MED ORDER — METOPROLOL TARTRATE 12.5 MG HALF TABLET
12.5000 mg | ORAL_TABLET | Freq: Once | ORAL | Status: AC
  Administered 2015-04-13: 12.5 mg via ORAL
  Filled 2015-04-12: qty 1

## 2015-04-12 MED ORDER — EPINEPHRINE HCL 1 MG/ML IJ SOLN
0.0000 ug/min | INTRAVENOUS | Status: DC
  Filled 2015-04-12: qty 4

## 2015-04-12 MED ORDER — PLASMA-LYTE 148 IV SOLN
INTRAVENOUS | Status: AC
  Administered 2015-04-13: 500 mL
  Filled 2015-04-12: qty 2.5

## 2015-04-12 MED ORDER — VANCOMYCIN HCL 10 G IV SOLR
1500.0000 mg | INTRAVENOUS | Status: AC
  Administered 2015-04-13: 1500 mg via INTRAVENOUS
  Filled 2015-04-12: qty 1500

## 2015-04-12 MED ORDER — BISACODYL 5 MG PO TBEC
5.0000 mg | DELAYED_RELEASE_TABLET | Freq: Once | ORAL | Status: AC
  Administered 2015-04-12: 5 mg via ORAL
  Filled 2015-04-12: qty 1

## 2015-04-12 NOTE — Research (Signed)
LEVO-CTS Informed Consent   Subject Name: Charles Mckinney  Subject met inclusion and exclusion criteria.  The informed consent form, study requirements and expectations were reviewed with the subject and questions and concerns were addressed prior to the signing of the consent form.  The subject verbalized understanding of the trial requirements.  The subject agreed to participate in the LEVO_CTS trial and signed the informed consent.  The informed consent was obtained prior to performance of any protocol-specific procedures for the subject.  A copy of the signed informed consent was given to the subject and a copy was placed in the subject's medical record.  [name] 04/12/2015, 1:11 PM

## 2015-04-12 NOTE — Progress Notes (Signed)
VASCULAR LAB PRELIMINARY  PRELIMINARY  PRELIMINARY  PRELIMINARY  Pre-op Cardiac Surgery  Carotid Findings:  Bilateral:  1-39% ICA stenosis.  Vertebral artery flow is antegrade.     Upper Extremity Right Left  Brachial Pressures 133 Triphasic 133 Triphasic  Radial Waveforms Triphasic Triphasic  Ulnar Waveforms Triphasic Triphasic  Palmar Arch (Allen's Test) Normal Normal   Findings:  Doppler waveforms remained normal bilaterally with both radial and ulnar compressions    Lower  Extremity Right Left  Dorsalis Pedis    Anterior Tibial    Posterior Tibial    Ankle/Brachial Indices      Findings:  Palpable pulses   Onika Gudiel, RVS 04/12/2015, 5:51 PM

## 2015-04-12 NOTE — Progress Notes (Signed)
      [address]       [name] [address]             [phone]     CARDIOTHORACIC SURGERY PROGRESS NOTE  3 Days Post-Op  S/P Procedure(s) (LRB): Right/Left Heart Cath and Coronary Angiography (N/A)  Subjective: Denies chest pain, SOB.  Some pain in toe.  Objective: Vital signs in last 24 hours: Temp:  [98.8 F (37.1 C)-99.2 F (37.3 C)] 98.8 F (37.1 C) (05/08 0815) Pulse Rate:  [83-103] 103 (05/08 0815) Cardiac Rhythm:  [-] Normal sinus rhythm (05/08 0800) Resp:  [12-24] 18 (05/08 0815) BP: (101-124)/(51-79) 124/79 mmHg (05/08 0815) SpO2:  [96 %-99 %] 99 % (05/08 0815) Weight:  [149.097 kg (328 lb 11.2 oz)] 149.097 kg (328 lb 11.2 oz) (05/08 0400)  Physical Exam:  Rhythm:   sinus  Breath sounds: clear  Heart sounds:  RRR  Incisions:  n/a  Abdomen:  soft  Extremities:  warm   Intake/Output from previous day: 05/07 0701 - 05/08 0700 In: 1200 [P.O.:1200] Out: 2401 [Urine:2400; Stool:1] Intake/Output this shift: Total I/O In: 480 [P.O.:480] Out: 1025 [Urine:1025]  Lab Results:  Recent Labs  04/11/15 0608 04/12/15 0400  WBC 7.4 9.2  HGB 14.0 14.5  HCT 42.6 44.1  PLT 185 196   BMET:  Recent Labs  04/10/15 0302 04/11/15 0608  NA 139 137  K 4.0 3.6  CL 103 100*  CO2 26 27  GLUCOSE 105* 110*  BUN 22* 24*  CREATININE 1.17 1.29*  CALCIUM 9.1 9.0    CBG (last 3)  No results for input(s): GLUCAP in the last 72 hours. PT/INR:  No results for input(s): LABPROT, INR in the last 72 hours.  CXR:  N/A  Assessment/Plan: S/P Procedure(s) (LRB): Right/Left Heart Cath and Coronary Angiography (N/A)  Clinically stable.  Labs okay Started on azithromycin today by Dr [name] for sore throat Started on colchicine today by Dr [name] for presumed gout Tentatively for CABG tomorrow by Dr Laneta Simmers.  All questions answered  I spent in excess of 15 minutes during the conduct of this hospital encounter and >50% of this time involved direct  face-to-face encounter with the patient for counseling and/or coordination of their care.   Purcell Nails 04/12/2015 12:10 PM

## 2015-04-12 NOTE — Progress Notes (Signed)
ANTICOAGULATION CONSULT NOTE - Follow Up Consult  Pharmacy Consult for  Heparin Indication: atrial fibrillation and 3V CAD, awaiting CABG  Allergies  Allergen Reactions  . Niacin And Related Other (See Comments)    FLUSHING  . Penicillins Itching and Rash    Patient Measurements: Height: 5\' 10"  (177.8 cm) Weight: (!) 328 lb 11.2 oz (149.097 kg) IBW/kg (Calculated) : 73 Heparin Dosing Weight: 111 kg  Vital Signs: Temp: 98.8 F (37.1 C) (05/08 0815) Temp Source: Oral (05/08 0815) BP: 124/79 mmHg (05/08 0815) Pulse Rate: 103 (05/08 0815)  Labs:  Recent Labs  04/10/15 0302  04/10/15 0743 04/10/15 1656 04/11/15 0608 04/12/15 0400  HGB 14.3  --   --   --  14.0 14.5  HCT 44.5  --   --   --  42.6 44.1  PLT 197  --   --   --  185 196  APTT  --   --  46*  --   --   --   HEPARINUNFRC  --   < > 0.18* 0.44 0.42 0.37  CREATININE 1.17  --   --   --  1.29*  --   < > = values in this interval not displayed.  Estimated Creatinine Clearance: 84.6 mL/min (by C-G formula based on Cr of 1.29).  Assessment:   Hx afib with cardioversion x 2 in February and again 04/02/15.   Home Xarelto on hold for cath 04/09/15 and remains on hold for CABG.  Last Xarelto dose 04/05/15 am.     Heparin level remains therapeutic (0.37) on 1800 units/hr.  Goal of Therapy:  Heparin level 0.3-0.7 Monitor platelets by anticoagulation protocol: Yes   Plan:   Continue heparin at 1800 units/hr.  Daily heparin level and CBC.  For CABG on 04/13/15.  Dennie Fetters, RPh Pager: [phone] 04/12/2015,10:40 AM

## 2015-04-12 NOTE — Progress Notes (Signed)
Ref: Default, Provider, MD   Subjective:  Right great toe pain. Increasing BUN/Creatinine. Some sore-throat.  Objective:  Vital Signs in the last 24 hours: Temp:  [98.8 F (37.1 C)-99.2 F (37.3 C)] 98.8 F (37.1 C) (05/08 0815) Pulse Rate:  [83-103] 103 (05/08 0815) Cardiac Rhythm:  [-] Normal sinus rhythm (05/08 0000) Resp:  [12-24] 18 (05/08 0815) BP: (101-124)/(51-79) 124/79 mmHg (05/08 0815) SpO2:  [96 %-99 %] 99 % (05/08 0815) Weight:  [149.097 kg (328 lb 11.2 oz)] 149.097 kg (328 lb 11.2 oz) (05/08 0400)  Physical Exam: BP Readings from Last 1 Encounters:  04/12/15 124/79    Wt Readings from Last 1 Encounters:  04/12/15 149.097 kg (328 lb 11.2 oz)    Weight change: 5.806 kg (12 lb 12.8 oz)  HEENT: [place]/AT, Eyes-Hazel, PERL, EOMI, Conjunctiva-Pink, Sclera-Non-icteric Neck: No JVD, No bruit, Trachea midline. Lungs:  Clearing, Bilateral. Cardiac:  Regular rhythm, normal S1 and S2, no S3. II/VI systolic murmur. Abdomen:  Soft, non-tender. Extremities:  1 + edema present. No cyanosis. No clubbing. CNS: AxOx3, Cranial nerves grossly intact, moves all 4 extremities. Right handed. Skin: Warm and dry.   Intake/Output from previous day: 05/07 0701 - 05/08 0700 In: 1200 [P.O.:1200] Out: 2401 [Urine:2400; Stool:1]    Lab Results: BMET    Component Value Date/Time   NA 137 04/11/2015 0608   NA 139 04/10/2015 0302   NA 141 04/08/2015 0410   K 3.6 04/11/2015 0608   K 4.0 04/10/2015 0302   K 3.9 04/08/2015 0410   CL 100* 04/11/2015 0608   CL 103 04/10/2015 0302   CL 103 04/08/2015 0410   CO2 27 04/11/2015 0608   CO2 26 04/10/2015 0302   CO2 24 04/08/2015 0410   GLUCOSE 110* 04/11/2015 0608   GLUCOSE 105* 04/10/2015 0302   GLUCOSE 94 04/08/2015 0410   GLUCOSE 99 09/12/2006 1309   BUN 24* 04/11/2015 0608   BUN 22* 04/10/2015 0302   BUN 23* 04/08/2015 0410   CREATININE 1.29* 04/11/2015 0608   CREATININE 1.17 04/10/2015 0302   CREATININE 1.24 04/08/2015 0410   CALCIUM 9.0 04/11/2015 0608   CALCIUM 9.1 04/10/2015 0302   CALCIUM 9.0 04/08/2015 0410   GFRNONAA 57* 04/11/2015 0608   GFRNONAA >60 04/10/2015 0302   GFRNONAA 60* 04/08/2015 0410   GFRAA >60 04/11/2015 0608   GFRAA >60 04/10/2015 0302   GFRAA >60 04/08/2015 0410   CBC    Component Value Date/Time   WBC 9.2 04/12/2015 0400   RBC 4.99 04/12/2015 0400   HGB 14.5 04/12/2015 0400   HCT 44.1 04/12/2015 0400   PLT 196 04/12/2015 0400   MCV 88.4 04/12/2015 0400   MCH 29.1 04/12/2015 0400   MCHC 32.9 04/12/2015 0400   RDW 14.7 04/12/2015 0400   LYMPHSABS 1.7 04/05/2015 1900   MONOABS 0.6 04/05/2015 1900   EOSABS 0.2 04/05/2015 1900   BASOSABS 0.0 04/05/2015 1900   HEPATIC Function Panel  Recent Labs  01/13/15 0120 04/05/15 1900 04/11/15 0608  PROT 5.9* 6.3* 6.5   HEMOGLOBIN A1C No components found for: HGA1C,  MPG CARDIAC ENZYMES Lab Results  Component Value Date   TROPONINI 0.03 04/06/2015   TROPONINI 0.03 04/05/2015   TROPONINI 0.03 04/05/2015   BNP No results for input(s): PROBNP in the last 8760 hours. TSH  Recent Labs  01/14/15 1140 04/05/15 1900  TSH 3.727 5.204*   CHOLESTEROL No results for input(s): CHOL in the last 8760 hours.  Scheduled Meds: . amiodarone  200 mg  Oral Daily  . aspirin EC  81 mg Oral Daily  . azithromycin  500 mg Oral Daily   Followed by  . [START ON 04/13/2015] azithromycin  250 mg Oral Daily  . carvedilol  3.125 mg Oral BID WC  . cholecalciferol  1,000 Units Oral Daily  . colchicine  0.6 mg Oral Daily  . potassium chloride SA  40 mEq Oral Daily  . pravastatin  40 mg Oral Daily  . sodium chloride  3 mL Intravenous Q12H  . sodium chloride  3 mL Intravenous Q12H  . sodium chloride  3 mL Intravenous Q12H   Continuous Infusions: . sodium chloride    . heparin 1,800 Units/hr (04/11/15 1919)  . nitroGLYCERIN 10 mcg/min (04/10/15 0717)   PRN Meds:.sodium chloride, sodium chloride, acetaminophen, albuterol, nitroGLYCERIN,  ondansetron (ZOFRAN) IV, sodium chloride, sodium chloride, sodium chloride  Assessment/Plan: Acute on chronic left heart systolic failure. Severe 3 Vessel coronary artery disease Atrial fibrillation, recurrent. Morbid obesity Hyperlipidemia Hypertension Hypokalemia Prolonged QTc. GERD Early dehydration  Hold Lasix for gout and renal function.  Add azithromycin.  Add colchicine NS slow drip.    LOS: 7 days    Charles Cobb  MD  04/12/2015, 9:21 AM

## 2015-04-13 ENCOUNTER — Inpatient Hospital Stay (HOSPITAL_COMMUNITY): Payer: Non-veteran care

## 2015-04-13 ENCOUNTER — Inpatient Hospital Stay (HOSPITAL_COMMUNITY): Payer: Non-veteran care | Admitting: Anesthesiology

## 2015-04-13 ENCOUNTER — Encounter (HOSPITAL_COMMUNITY)
Admission: EM | Disposition: A | Discharge status: Skilled Nursing Facility | Payer: Self-pay | Source: Home / Self Care | Attending: Surgery

## 2015-04-13 ENCOUNTER — Encounter (HOSPITAL_COMMUNITY)
Admission: EM | Disposition: A | Discharge status: Skilled Nursing Facility | Payer: Non-veteran care | Source: Home / Self Care | Attending: Surgery

## 2015-04-13 DIAGNOSIS — Z951 Presence of aortocoronary bypass graft: Secondary | ICD-10-CM

## 2015-04-13 DIAGNOSIS — I25110 Atherosclerotic heart disease of native coronary artery with unstable angina pectoris: Secondary | ICD-10-CM

## 2015-04-13 HISTORY — PX: CLIPPING OF ATRIAL APPENDAGE: SHX5773

## 2015-04-13 HISTORY — PX: TEE WITHOUT CARDIOVERSION: SHX5443

## 2015-04-13 HISTORY — DX: Presence of aortocoronary bypass graft: Z95.1

## 2015-04-13 HISTORY — PX: CORONARY ARTERY BYPASS GRAFT: SHX141

## 2015-04-13 LAB — MAGNESIUM: Magnesium: 2.6 mg/dL — ABNORMAL HIGH (ref 1.7–2.4)

## 2015-04-13 LAB — POCT I-STAT, CHEM 8
BUN: 24 mg/dL — ABNORMAL HIGH (ref 6–20)
BUN: 19 mg/dL (ref 6–20)
BUN: 19 mg/dL (ref 6–20)
BUN: 17 mg/dL (ref 6–20)
BUN: 18 mg/dL (ref 6–20)
BUN: 17 mg/dL (ref 6–20)
CALCIUM ION: 1.25 mmol/L (ref 1.13–1.30)
CHLORIDE: 102 mmol/L (ref 101–111)
CHLORIDE: 101 mmol/L (ref 101–111)
CHLORIDE: 104 mmol/L (ref 101–111)
Calcium, Ion: 1.1 mmol/L — ABNORMAL LOW (ref 1.13–1.30)
Calcium, Ion: 1.25 mmol/L (ref 1.13–1.30)
Calcium, Ion: 1.07 mmol/L — ABNORMAL LOW (ref 1.13–1.30)
Calcium, Ion: 1.06 mmol/L — ABNORMAL LOW (ref 1.13–1.30)
Calcium, Ion: 1.13 mmol/L (ref 1.13–1.30)
Chloride: 103 mmol/L (ref 101–111)
Chloride: 102 mmol/L (ref 101–111)
Chloride: 101 mmol/L (ref 101–111)
Creatinine, Ser: 1 mg/dL (ref 0.61–1.24)
Creatinine, Ser: 0.8 mg/dL (ref 0.61–1.24)
Creatinine, Ser: 1 mg/dL (ref 0.61–1.24)
Creatinine, Ser: 0.9 mg/dL (ref 0.61–1.24)
Creatinine, Ser: 0.9 mg/dL (ref 0.61–1.24)
Creatinine, Ser: 0.9 mg/dL (ref 0.61–1.24)
GLUCOSE: 136 mg/dL — AB (ref 70–99)
Glucose, Bld: 111 mg/dL — ABNORMAL HIGH (ref 70–99)
Glucose, Bld: 103 mg/dL — ABNORMAL HIGH (ref 70–99)
Glucose, Bld: 108 mg/dL — ABNORMAL HIGH (ref 70–99)
Glucose, Bld: 137 mg/dL — ABNORMAL HIGH (ref 70–99)
Glucose, Bld: 161 mg/dL — ABNORMAL HIGH (ref 70–99)
HCT: 29 % — ABNORMAL LOW (ref 39.0–52.0)
HCT: 36 % — ABNORMAL LOW (ref 39.0–52.0)
HCT: 37 % — ABNORMAL LOW (ref 39.0–52.0)
HEMATOCRIT: 41 % (ref 39.0–52.0)
HEMATOCRIT: 30 % — AB (ref 39.0–52.0)
HEMATOCRIT: 31 % — AB (ref 39.0–52.0)
HEMOGLOBIN: 13.9 g/dL (ref 13.0–17.0)
HEMOGLOBIN: 12.2 g/dL — AB (ref 13.0–17.0)
HEMOGLOBIN: 10.2 g/dL — AB (ref 13.0–17.0)
HEMOGLOBIN: 10.5 g/dL — AB (ref 13.0–17.0)
Hemoglobin: 9.9 g/dL — ABNORMAL LOW (ref 13.0–17.0)
Hemoglobin: 12.6 g/dL — ABNORMAL LOW (ref 13.0–17.0)
Potassium: 4.4 mmol/L (ref 3.5–5.1)
Potassium: 4.5 mmol/L (ref 3.5–5.1)
Potassium: 5.7 mmol/L — ABNORMAL HIGH (ref 3.5–5.1)
Potassium: 5.4 mmol/L — ABNORMAL HIGH (ref 3.5–5.1)
Potassium: 4.8 mmol/L (ref 3.5–5.1)
Potassium: 4.3 mmol/L (ref 3.5–5.1)
SODIUM: 137 mmol/L (ref 135–145)
SODIUM: 133 mmol/L — AB (ref 135–145)
SODIUM: 138 mmol/L (ref 135–145)
SODIUM: 137 mmol/L (ref 135–145)
Sodium: 135 mmol/L (ref 135–145)
Sodium: 138 mmol/L (ref 135–145)
TCO2: 25 mmol/L (ref 0–100)
TCO2: 24 mmol/L (ref 0–100)
TCO2: 26 mmol/L (ref 0–100)
TCO2: 24 mmol/L (ref 0–100)
TCO2: 22 mmol/L (ref 0–100)
TCO2: 21 mmol/L (ref 0–100)

## 2015-04-13 LAB — POCT I-STAT 3, ART BLOOD GAS (G3+)
Acid-Base Excess: 1 mmol/L (ref 0.0–2.0)
Acid-base deficit: 1 mmol/L (ref 0.0–2.0)
Acid-base deficit: 2 mmol/L (ref 0.0–2.0)
Acid-base deficit: 1 mmol/L (ref 0.0–2.0)
Acid-base deficit: 2 mmol/L (ref 0.0–2.0)
BICARBONATE: 26.6 meq/L — AB (ref 20.0–24.0)
BICARBONATE: 23.8 meq/L (ref 20.0–24.0)
BICARBONATE: 24.2 meq/L — AB (ref 20.0–24.0)
Bicarbonate: 24 mEq/L (ref 20.0–24.0)
Bicarbonate: 24.1 mEq/L — ABNORMAL HIGH (ref 20.0–24.0)
O2 Saturation: 100 %
O2 Saturation: 99 %
O2 Saturation: 96 %
O2 Saturation: 93 %
O2 Saturation: 93 %
PCO2 ART: 44.4 mmHg (ref 35.0–45.0)
PCO2 ART: 42.7 mmHg (ref 35.0–45.0)
PH ART: 7.35 (ref 7.350–7.450)
PH ART: 7.338 — AB (ref 7.350–7.450)
PH ART: 7.345 — AB (ref 7.350–7.450)
PO2 ART: 344 mmHg — AB (ref 80.0–100.0)
Patient temperature: 36.3
TCO2: 28 mmol/L (ref 0–100)
TCO2: 25 mmol/L (ref 0–100)
TCO2: 25 mmol/L (ref 0–100)
TCO2: 25 mmol/L (ref 0–100)
TCO2: 25 mmol/L (ref 0–100)
pCO2 arterial: 48.2 mmHg — ABNORMAL HIGH (ref 35.0–45.0)
pCO2 arterial: 38 mmHg (ref 35.0–45.0)
pCO2 arterial: 44.4 mmHg (ref 35.0–45.0)
pH, Arterial: 7.405 (ref 7.350–7.450)
pH, Arterial: 7.362 (ref 7.350–7.450)
pO2, Arterial: 161 mmHg — ABNORMAL HIGH (ref 80.0–100.0)
pO2, Arterial: 87 mmHg (ref 80.0–100.0)
pO2, Arterial: 71 mmHg — ABNORMAL LOW (ref 80.0–100.0)
pO2, Arterial: 73 mmHg — ABNORMAL LOW (ref 80.0–100.0)

## 2015-04-13 LAB — APTT: aPTT: 30 seconds (ref 24–37)

## 2015-04-13 LAB — CBC
HCT: 41.4 % (ref 39.0–52.0)
HCT: 36.4 % — ABNORMAL LOW (ref 39.0–52.0)
HEMATOCRIT: 37.7 % — AB (ref 39.0–52.0)
HEMOGLOBIN: 13.7 g/dL (ref 13.0–17.0)
Hemoglobin: 11.9 g/dL — ABNORMAL LOW (ref 13.0–17.0)
Hemoglobin: 12.5 g/dL — ABNORMAL LOW (ref 13.0–17.0)
MCH: 29.1 pg (ref 26.0–34.0)
MCH: 28.7 pg (ref 26.0–34.0)
MCH: 29 pg (ref 26.0–34.0)
MCHC: 33.1 g/dL (ref 30.0–36.0)
MCHC: 32.7 g/dL (ref 30.0–36.0)
MCHC: 33.2 g/dL (ref 30.0–36.0)
MCV: 88.1 fL (ref 78.0–100.0)
MCV: 87.7 fL (ref 78.0–100.0)
MCV: 87.5 fL (ref 78.0–100.0)
PLATELETS: 143 10*3/uL — AB (ref 150–400)
Platelets: 190 10*3/uL (ref 150–400)
Platelets: 145 10*3/uL — ABNORMAL LOW (ref 150–400)
RBC: 4.7 MIL/uL (ref 4.22–5.81)
RBC: 4.15 MIL/uL — ABNORMAL LOW (ref 4.22–5.81)
RBC: 4.31 MIL/uL (ref 4.22–5.81)
RDW: 14.5 % (ref 11.5–15.5)
RDW: 14.4 % (ref 11.5–15.5)
RDW: 14.4 % (ref 11.5–15.5)
WBC: 8.1 10*3/uL (ref 4.0–10.5)
WBC: 13.5 10*3/uL — AB (ref 4.0–10.5)
WBC: 12.4 10*3/uL — ABNORMAL HIGH (ref 4.0–10.5)

## 2015-04-13 LAB — BASIC METABOLIC PANEL
Anion gap: 10 (ref 5–15)
BUN: 21 mg/dL — ABNORMAL HIGH (ref 6–20)
CO2: 24 mmol/L (ref 22–32)
Calcium: 9.2 mg/dL (ref 8.9–10.3)
Chloride: 102 mmol/L (ref 101–111)
Creatinine, Ser: 1.17 mg/dL (ref 0.61–1.24)
Glucose, Bld: 99 mg/dL (ref 70–99)
POTASSIUM: 4 mmol/L (ref 3.5–5.1)
SODIUM: 136 mmol/L (ref 135–145)

## 2015-04-13 LAB — CK TOTAL AND CKMB (NOT AT ARMC)
CK, MB: 1.6 ng/mL (ref 0.5–5.0)
RELATIVE INDEX: INVALID (ref 0.0–2.5)
Total CK: 42 U/L — ABNORMAL LOW (ref 49–397)

## 2015-04-13 LAB — PROTIME-INR
INR: 1.23 (ref 0.00–1.49)
PROTHROMBIN TIME: 15.6 s — AB (ref 11.6–15.2)

## 2015-04-13 LAB — POCT I-STAT 4, (NA,K, GLUC, HGB,HCT)
Glucose, Bld: 121 mg/dL — ABNORMAL HIGH (ref 70–99)
HEMATOCRIT: 36 % — AB (ref 39.0–52.0)
HEMOGLOBIN: 12.2 g/dL — AB (ref 13.0–17.0)
Potassium: 4.3 mmol/L (ref 3.5–5.1)
Sodium: 138 mmol/L (ref 135–145)

## 2015-04-13 LAB — HEMOGLOBIN AND HEMATOCRIT, BLOOD
HCT: 31.1 % — ABNORMAL LOW (ref 39.0–52.0)
HEMOGLOBIN: 10.5 g/dL — AB (ref 13.0–17.0)

## 2015-04-13 LAB — CREATININE, SERUM
Creatinine, Ser: 0.92 mg/dL (ref 0.61–1.24)
GFR calc Af Amer: >60 mL/min (ref >60)

## 2015-04-13 LAB — HEPARIN LEVEL (UNFRACTIONATED): Heparin Unfractionated: 0.37 IU/mL (ref 0.30–0.70)

## 2015-04-13 LAB — BRAIN NATRIURETIC PEPTIDE: B NATRIURETIC PEPTIDE 5: 142.4 pg/mL — AB (ref 0.0–100.0)

## 2015-04-13 LAB — PLATELET COUNT: Platelets: 127 10*3/uL — ABNORMAL LOW (ref 150–400)

## 2015-04-13 LAB — TROPONIN I: Troponin I: <0.03 ng/mL (ref <0.031)

## 2015-04-13 SURGERY — CORONARY ARTERY BYPASS GRAFTING (CABG)
Anesthesia: General | Site: Chest

## 2015-04-13 MED ORDER — STERILE WATER FOR INJECTION IJ SOLN
INTRAMUSCULAR | Status: AC
  Filled 2015-04-13: qty 10

## 2015-04-13 MED ORDER — VANCOMYCIN HCL IN DEXTROSE 1-5 GM/200ML-% IV SOLN
1000.0000 mg | Freq: Once | INTRAVENOUS | Status: AC
  Administered 2015-04-13: 1000 mg via INTRAVENOUS
  Filled 2015-04-13: qty 200

## 2015-04-13 MED ORDER — HEMOSTATIC AGENTS (NO CHARGE) OPTIME
TOPICAL | Status: DC | PRN
  Administered 2015-04-13: 1 via TOPICAL

## 2015-04-13 MED ORDER — MORPHINE SULFATE 2 MG/ML IJ SOLN
2.0000 mg | INTRAMUSCULAR | Status: DC | PRN
  Administered 2015-04-13 – 2015-04-14 (×7): 4 mg via INTRAVENOUS
  Filled 2015-04-13: qty 2
  Filled 2015-04-13: qty 1
  Filled 2015-04-13 (×6): qty 2
  Filled 2015-04-13: qty 1

## 2015-04-13 MED ORDER — CETYLPYRIDINIUM CHLORIDE 0.05 % MT LIQD
7.0000 mL | Freq: Four times a day (QID) | OROMUCOSAL | Status: DC
  Administered 2015-04-14 (×2): 7 mL via OROMUCOSAL

## 2015-04-13 MED ORDER — ONDANSETRON HCL 4 MG/2ML IJ SOLN
INTRAMUSCULAR | Status: AC
  Filled 2015-04-13: qty 2

## 2015-04-13 MED ORDER — FENTANYL CITRATE (PF) 250 MCG/5ML IJ SOLN
INTRAMUSCULAR | Status: AC
  Filled 2015-04-13: qty 5

## 2015-04-13 MED ORDER — HEPARIN SODIUM (PORCINE) 1000 UNIT/ML IJ SOLN
INTRAMUSCULAR | Status: AC
  Filled 2015-04-13: qty 2

## 2015-04-13 MED ORDER — MORPHINE SULFATE 2 MG/ML IJ SOLN
1.0000 mg | INTRAMUSCULAR | Status: AC | PRN
  Administered 2015-04-13 (×2): 2 mg via INTRAVENOUS

## 2015-04-13 MED ORDER — PROPOFOL 10 MG/ML IV BOLUS
INTRAVENOUS | Status: DC | PRN
  Administered 2015-04-13: 175 mg via INTRAVENOUS

## 2015-04-13 MED ORDER — LACTATED RINGERS IV SOLN
INTRAVENOUS | Status: DC | PRN
  Administered 2015-04-13: 09:00:00 via INTRAVENOUS

## 2015-04-13 MED ORDER — ARTIFICIAL TEARS OP OINT
TOPICAL_OINTMENT | OPHTHALMIC | Status: AC
  Filled 2015-04-13: qty 3.5

## 2015-04-13 MED ORDER — SODIUM CHLORIDE 0.9 % IV SOLN: 250.0000 mL | INTRAVENOUS | Status: DC

## 2015-04-13 MED ORDER — SUCCINYLCHOLINE CHLORIDE 20 MG/ML IJ SOLN
INTRAMUSCULAR | Status: AC
  Filled 2015-04-13: qty 1

## 2015-04-13 MED ORDER — SODIUM CHLORIDE 0.45 % IV SOLN
INTRAVENOUS | Status: DC | PRN
  Administered 2015-04-13: 17:00:00 via INTRAVENOUS
  Administered 2015-04-14: 20 mL via INTRAVENOUS

## 2015-04-13 MED ORDER — SODIUM CHLORIDE 0.9 % IV SOLN: INTRAVENOUS | Status: DC

## 2015-04-13 MED ORDER — ASPIRIN EC 325 MG PO TBEC
325.0000 mg | DELAYED_RELEASE_TABLET | Freq: Every day | ORAL | Status: DC
  Administered 2015-04-14 – 2015-04-15 (×2): 325 mg via ORAL
  Filled 2015-04-13 (×2): qty 1

## 2015-04-13 MED ORDER — MIDAZOLAM HCL 2 MG/2ML IJ SOLN
INTRAMUSCULAR | Status: AC
  Filled 2015-04-13: qty 2

## 2015-04-13 MED ORDER — THROMBIN 20000 UNITS EX SOLR
CUTANEOUS | Status: AC
  Filled 2015-04-13: qty 20000

## 2015-04-13 MED ORDER — SODIUM CHLORIDE 0.9 % IJ SOLN: 3.0000 mL | INTRAMUSCULAR | Status: DC | PRN

## 2015-04-13 MED ORDER — STUDY - INVESTIGATIONAL DRUG SIMPLE RECORD
0.2000 ug/kg/min | Status: DC
  Filled 2015-04-13: qty 0.01

## 2015-04-13 MED ORDER — VECURONIUM BROMIDE 10 MG IV SOLR
INTRAVENOUS | Status: AC
  Filled 2015-04-13: qty 10

## 2015-04-13 MED ORDER — ASPIRIN 81 MG PO CHEW: 324.0000 mg | CHEWABLE_TABLET | Freq: Every day | ORAL | Status: DC

## 2015-04-13 MED ORDER — LACTATED RINGERS IV SOLN: 500.0000 mL | Freq: Once | INTRAVENOUS | Status: AC | PRN

## 2015-04-13 MED ORDER — LACTATED RINGERS IV SOLN: INTRAVENOUS | Status: DC

## 2015-04-13 MED ORDER — DEXTROSE 5 % IV SOLN
1.5000 g | Freq: Two times a day (BID) | INTRAVENOUS | Status: AC
  Administered 2015-04-13 – 2015-04-15 (×4): 1.5 g via INTRAVENOUS
  Filled 2015-04-13 (×4): qty 1.5

## 2015-04-13 MED ORDER — MIDAZOLAM HCL 10 MG/2ML IJ SOLN
INTRAMUSCULAR | Status: AC
  Filled 2015-04-13: qty 2

## 2015-04-13 MED ORDER — EPHEDRINE SULFATE 50 MG/ML IJ SOLN
INTRAMUSCULAR | Status: AC
  Filled 2015-04-13: qty 1

## 2015-04-13 MED ORDER — DEXTROSE 5 % IV SOLN
0.0000 ug/min | INTRAVENOUS | Status: DC
  Filled 2015-04-13: qty 2

## 2015-04-13 MED ORDER — SODIUM CHLORIDE 0.9 % IV SOLN
0.1000 ug/kg/min | INTRAVENOUS | Status: DC
  Administered 2015-04-13: .2 ug/kg/min via INTRAVENOUS
  Filled 2015-04-13: qty 0.01

## 2015-04-13 MED ORDER — FAMOTIDINE IN NACL 20-0.9 MG/50ML-% IV SOLN
20.0000 mg | Freq: Two times a day (BID) | INTRAVENOUS | Status: AC
  Administered 2015-04-13 – 2015-04-14 (×2): 20 mg via INTRAVENOUS
  Filled 2015-04-13 (×2): qty 50

## 2015-04-13 MED ORDER — THROMBIN 20000 UNITS EX SOLR
CUTANEOUS | Status: DC | PRN
  Administered 2015-04-13: 20000 [IU] via TOPICAL

## 2015-04-13 MED ORDER — DOPAMINE-DEXTROSE 3.2-5 MG/ML-% IV SOLN: 0.0000 ug/kg/min | INTRAVENOUS | Status: DC

## 2015-04-13 MED ORDER — ACETAMINOPHEN 500 MG PO TABS
1000.0000 mg | ORAL_TABLET | Freq: Four times a day (QID) | ORAL | Status: DC
  Administered 2015-04-14 – 2015-04-15 (×5): 1000 mg via ORAL
  Filled 2015-04-13 (×11): qty 2

## 2015-04-13 MED ORDER — SODIUM CHLORIDE 0.9 % IJ SOLN
INTRAMUSCULAR | Status: AC
  Filled 2015-04-13: qty 10

## 2015-04-13 MED ORDER — PROTAMINE SULFATE 10 MG/ML IV SOLN
INTRAVENOUS | Status: DC | PRN
Start: 2015-04-13 — End: 2015-04-13
  Administered 2015-04-13: 75 mg via INTRAVENOUS
  Administered 2015-04-13: 150 mg via INTRAVENOUS
  Administered 2015-04-13: 45 mg via INTRAVENOUS
  Administered 2015-04-13: 5 mg via INTRAVENOUS
  Administered 2015-04-13: 50 mg via INTRAVENOUS

## 2015-04-13 MED ORDER — BISACODYL 10 MG RE SUPP: 10.0000 mg | Freq: Every day | RECTAL | Status: DC

## 2015-04-13 MED ORDER — DEXMEDETOMIDINE HCL IN NACL 200 MCG/50ML IV SOLN
0.0000 ug/kg/h | INTRAVENOUS | Status: DC
  Administered 2015-04-13: 0.5 ug/kg/h via INTRAVENOUS
  Filled 2015-04-13: qty 50

## 2015-04-13 MED ORDER — OXYCODONE HCL 5 MG PO TABS
5.0000 mg | ORAL_TABLET | ORAL | Status: DC | PRN
  Administered 2015-04-14 – 2015-04-15 (×2): 5 mg via ORAL
  Administered 2015-04-15: 10 mg via ORAL
  Filled 2015-04-13: qty 2
  Filled 2015-04-13 (×2): qty 1

## 2015-04-13 MED ORDER — ROCURONIUM BROMIDE 100 MG/10ML IV SOLN
INTRAVENOUS | Status: DC | PRN
  Administered 2015-04-13: 100 mg via INTRAVENOUS

## 2015-04-13 MED ORDER — CHLORHEXIDINE GLUCONATE 0.12 % MT SOLN
15.0000 mL | Freq: Two times a day (BID) | OROMUCOSAL | Status: DC
  Filled 2015-04-13: qty 15

## 2015-04-13 MED ORDER — ACETAMINOPHEN 650 MG RE SUPP
650.0000 mg | Freq: Once | RECTAL | Status: AC
  Administered 2015-04-13: 650 mg via RECTAL

## 2015-04-13 MED ORDER — SODIUM CHLORIDE 0.9 % IV SOLN
INTRAVENOUS | Status: DC
  Filled 2015-04-13: qty 40

## 2015-04-13 MED ORDER — AMIODARONE HCL IN DEXTROSE 360-4.14 MG/200ML-% IV SOLN
INTRAVENOUS | Status: AC
  Administered 2015-04-13: 200 mL
  Filled 2015-04-13: qty 200

## 2015-04-13 MED ORDER — HEPARIN SODIUM (PORCINE) 1000 UNIT/ML IJ SOLN
INTRAMUSCULAR | Status: AC
  Filled 2015-04-13: qty 1

## 2015-04-13 MED ORDER — METOPROLOL TARTRATE 1 MG/ML IV SOLN: 2.5000 mg | INTRAVENOUS | Status: DC | PRN

## 2015-04-13 MED ORDER — MAGNESIUM SULFATE 4 GM/100ML IV SOLN
4.0000 g | Freq: Once | INTRAVENOUS | Status: AC
  Administered 2015-04-13: 4 g via INTRAVENOUS
  Filled 2015-04-13: qty 100

## 2015-04-13 MED ORDER — NITROGLYCERIN 0.2 MG/ML ON CALL CATH LAB
INTRAVENOUS | Status: DC | PRN
  Administered 2015-04-13 (×3): 40 ug via INTRAVENOUS

## 2015-04-13 MED ORDER — SODIUM CHLORIDE 0.9 % IJ SOLN
3.0000 mL | Freq: Two times a day (BID) | INTRAMUSCULAR | Status: DC
  Administered 2015-04-15: 3 mL via INTRAVENOUS

## 2015-04-13 MED ORDER — INSULIN REGULAR BOLUS VIA INFUSION
0.0000 [IU] | Freq: Three times a day (TID) | INTRAVENOUS | Status: DC
  Administered 2015-04-14: 5 [IU] via INTRAVENOUS
  Filled 2015-04-13: qty 10

## 2015-04-13 MED ORDER — NITROGLYCERIN IN D5W 200-5 MCG/ML-% IV SOLN: 0.0000 ug/min | INTRAVENOUS | Status: DC

## 2015-04-13 MED ORDER — DOCUSATE SODIUM 100 MG PO CAPS
200.0000 mg | ORAL_CAPSULE | Freq: Every day | ORAL | Status: DC
Start: 2015-04-14 — End: 2015-04-15
  Administered 2015-04-14 – 2015-04-15 (×2): 200 mg via ORAL
  Filled 2015-04-13 (×2): qty 2

## 2015-04-13 MED ORDER — INSULIN REGULAR HUMAN 100 UNIT/ML IJ SOLN
INTRAMUSCULAR | Status: AC
  Administered 2015-04-13: 1.3 [IU]/h via INTRAVENOUS
  Filled 2015-04-13: qty 2.5

## 2015-04-13 MED ORDER — ROCURONIUM BROMIDE 50 MG/5ML IV SOLN
INTRAVENOUS | Status: AC
  Filled 2015-04-13: qty 2

## 2015-04-13 MED ORDER — METOPROLOL TARTRATE 25 MG/10 ML ORAL SUSPENSION
12.5000 mg | Freq: Two times a day (BID) | ORAL | Status: DC
  Administered 2015-04-13: 12.5 mg
  Filled 2015-04-13 (×3): qty 5

## 2015-04-13 MED ORDER — BISACODYL 5 MG PO TBEC
10.0000 mg | DELAYED_RELEASE_TABLET | Freq: Every day | ORAL | Status: DC
  Administered 2015-04-14: 10 mg via ORAL

## 2015-04-13 MED ORDER — AMIODARONE HCL IN DEXTROSE 360-4.14 MG/200ML-% IV SOLN
30.0000 mg/h | INTRAVENOUS | Status: DC
  Administered 2015-04-13 – 2015-04-15 (×4): 30 mg/h via INTRAVENOUS
  Filled 2015-04-13 (×7): qty 200

## 2015-04-13 MED ORDER — VECURONIUM BROMIDE 10 MG IV SOLR
INTRAVENOUS | Status: DC | PRN
  Administered 2015-04-13 (×5): 5 mg via INTRAVENOUS

## 2015-04-13 MED ORDER — LACTATED RINGERS IV SOLN
INTRAVENOUS | Status: DC | PRN
Start: 2015-04-13 — End: 2015-04-13
  Administered 2015-04-13: 08:00:00 via INTRAVENOUS

## 2015-04-13 MED ORDER — POTASSIUM CHLORIDE 10 MEQ/50ML IV SOLN
10.0000 meq | INTRAVENOUS | Status: AC
Start: 2015-04-13 — End: 2015-04-13

## 2015-04-13 MED ORDER — MIDAZOLAM HCL 5 MG/5ML IJ SOLN
INTRAMUSCULAR | Status: DC | PRN
  Administered 2015-04-13: 4 mg via INTRAVENOUS
  Administered 2015-04-13 (×2): 2 mg via INTRAVENOUS
  Administered 2015-04-13: 5 mg via INTRAVENOUS
  Administered 2015-04-13: 4 mg via INTRAVENOUS
  Administered 2015-04-13: 2 mg via INTRAVENOUS
  Administered 2015-04-13: 1 mg via INTRAVENOUS
  Administered 2015-04-13: 2 mg via INTRAVENOUS

## 2015-04-13 MED ORDER — PANTOPRAZOLE SODIUM 40 MG PO TBEC
40.0000 mg | DELAYED_RELEASE_TABLET | Freq: Every day | ORAL | Status: DC
  Administered 2015-04-15: 40 mg via ORAL
  Filled 2015-04-13: qty 1

## 2015-04-13 MED ORDER — SURGIFOAM 100 EX MISC
CUTANEOUS | Status: DC | PRN
  Administered 2015-04-13: 4 mL via TOPICAL

## 2015-04-13 MED ORDER — FENTANYL CITRATE (PF) 100 MCG/2ML IJ SOLN
INTRAMUSCULAR | Status: DC | PRN
  Administered 2015-04-13: 50 ug via INTRAVENOUS
  Administered 2015-04-13: 25 ug via INTRAVENOUS
  Administered 2015-04-13: 250 ug via INTRAVENOUS
  Administered 2015-04-13: 500 ug via INTRAVENOUS
  Administered 2015-04-13: 250 ug via INTRAVENOUS
  Administered 2015-04-13 (×2): 200 ug via INTRAVENOUS
  Administered 2015-04-13 (×3): 250 ug via INTRAVENOUS
  Administered 2015-04-13: 25 ug via INTRAVENOUS

## 2015-04-13 MED ORDER — TRAMADOL HCL 50 MG PO TABS: 50.0000 mg | ORAL_TABLET | ORAL | Status: DC | PRN

## 2015-04-13 MED ORDER — METOPROLOL TARTRATE 12.5 MG HALF TABLET
12.5000 mg | ORAL_TABLET | Freq: Two times a day (BID) | ORAL | Status: DC
  Filled 2015-04-13 (×3): qty 1

## 2015-04-13 MED ORDER — ONDANSETRON HCL 4 MG/2ML IJ SOLN
INTRAMUSCULAR | Status: DC | PRN
  Administered 2015-04-13: 4 mg via INTRAVENOUS

## 2015-04-13 MED ORDER — MIDAZOLAM HCL 2 MG/2ML IJ SOLN: 2.0000 mg | INTRAMUSCULAR | Status: DC | PRN

## 2015-04-13 MED ORDER — ACETAMINOPHEN 160 MG/5ML PO SOLN: 650.0000 mg | Freq: Once | ORAL | Status: AC

## 2015-04-13 MED ORDER — LACTATED RINGERS IV SOLN
INTRAVENOUS | Status: DC
  Administered 2015-04-14: 20 mL via INTRAVENOUS

## 2015-04-13 MED ORDER — ACETAMINOPHEN 160 MG/5ML PO SOLN
1000.0000 mg | Freq: Four times a day (QID) | ORAL | Status: DC
  Filled 2015-04-13: qty 40

## 2015-04-13 MED ORDER — ONDANSETRON HCL 4 MG/2ML IJ SOLN
4.0000 mg | Freq: Four times a day (QID) | INTRAMUSCULAR | Status: DC | PRN
Start: 2015-04-13 — End: 2015-04-15
  Administered 2015-04-13 – 2015-04-14 (×2): 4 mg via INTRAVENOUS
  Filled 2015-04-13 (×2): qty 2

## 2015-04-13 MED ORDER — LIDOCAINE HCL (CARDIAC) 20 MG/ML IV SOLN
INTRAVENOUS | Status: AC
  Filled 2015-04-13: qty 5

## 2015-04-13 MED ORDER — PHENYLEPHRINE HCL 10 MG/ML IJ SOLN
INTRAMUSCULAR | Status: DC | PRN
  Administered 2015-04-13 (×2): 40 ug via INTRAVENOUS

## 2015-04-13 MED ORDER — SODIUM CHLORIDE 0.9 % IJ SOLN
INTRAMUSCULAR | Status: AC
  Filled 2015-04-13: qty 20

## 2015-04-13 MED ORDER — HEPARIN SODIUM (PORCINE) 1000 UNIT/ML IJ SOLN
INTRAMUSCULAR | Status: DC | PRN
  Administered 2015-04-13: 50000 [IU] via INTRAVENOUS

## 2015-04-13 MED ORDER — 0.9 % SODIUM CHLORIDE (POUR BTL) OPTIME
TOPICAL | Status: DC | PRN
  Administered 2015-04-13: 6000 mL

## 2015-04-13 MED ORDER — ALBUMIN HUMAN 5 % IV SOLN
250.0000 mL | INTRAVENOUS | Status: AC | PRN
Start: 2015-04-13 — End: 2015-04-14
  Administered 2015-04-13: 250 mL via INTRAVENOUS

## 2015-04-13 MED ORDER — ARTIFICIAL TEARS OP OINT
TOPICAL_OINTMENT | OPHTHALMIC | Status: DC | PRN
  Administered 2015-04-13: 1 via OPHTHALMIC

## 2015-04-13 SURGICAL SUPPLY — 109 items
ADAPTER CARDIO PERF ANTE/RETRO (ADAPTER) ×4 IMPLANT
ADAPTER MULTI PERFUSION 15 (ADAPTER) ×4 IMPLANT
BAG DECANTER FOR FLEXI CONT (MISCELLANEOUS) ×4 IMPLANT
BANDAGE ELASTIC 4 VELCRO ST LF (GAUZE/BANDAGES/DRESSINGS) ×4 IMPLANT
BANDAGE ELASTIC 6 VELCRO ST LF (GAUZE/BANDAGES/DRESSINGS) ×4 IMPLANT
BASKET HEART (ORDER IN 25'S) (MISCELLANEOUS) ×1
BASKET HEART (ORDER IN 25S) (MISCELLANEOUS) ×3 IMPLANT
BLADE STERNUM SYSTEM 6 (BLADE) ×4 IMPLANT
BNDG GAUZE ELAST 4 BULKY (GAUZE/BANDAGES/DRESSINGS) ×4 IMPLANT
CANISTER SUCTION 2500CC (MISCELLANEOUS) ×4 IMPLANT
CANNULA ARTERIAL NVNT 3/8 24FR (CANNULA) ×4 IMPLANT
CANNULA GUNDRY RCSP 15FR (MISCELLANEOUS) ×4 IMPLANT
CATH ROBINSON RED A/P 18FR (CATHETERS) ×12 IMPLANT
CATH THORACIC 28FR (CATHETERS) ×4 IMPLANT
CATH THORACIC 36FR (CATHETERS) ×4 IMPLANT
CATH THORACIC 36FR RT ANG (CATHETERS) ×4 IMPLANT
CLIP TI MEDIUM 24 (CLIP) IMPLANT
CLIP TI WIDE RED SMALL 24 (CLIP) ×8 IMPLANT
COVER SURGICAL LIGHT HANDLE (MISCELLANEOUS) ×4 IMPLANT
CRADLE DONUT ADULT HEAD (MISCELLANEOUS) ×4 IMPLANT
DRAPE CARDIOVASCULAR INCISE (DRAPES) ×1
DRAPE SLUSH/WARMER DISC (DRAPES) ×4 IMPLANT
DRAPE SRG 135X102X78XABS (DRAPES) ×3 IMPLANT
DRSG COVADERM 4X14 (GAUZE/BANDAGES/DRESSINGS) ×4 IMPLANT
DRSG TEGADERM 2-3/8X2-3/4 SM (GAUZE/BANDAGES/DRESSINGS) ×4 IMPLANT
ELECT BLADE 4.0 EZ CLEAN MEGAD (MISCELLANEOUS) ×4
ELECT CAUTERY BLADE 6.4 (BLADE) ×4 IMPLANT
ELECT REM PT RETURN 9FT ADLT (ELECTROSURGICAL) ×8
ELECTRODE BLDE 4.0 EZ CLN MEGD (MISCELLANEOUS) ×3 IMPLANT
ELECTRODE REM PT RTRN 9FT ADLT (ELECTROSURGICAL) ×6 IMPLANT
GAUZE SPONGE 4X4 12PLY STRL (GAUZE/BANDAGES/DRESSINGS) ×8 IMPLANT
GAUZE XEROFORM 5X9 LF (GAUZE/BANDAGES/DRESSINGS) ×4 IMPLANT
GLOVE BIO SURGEON STRL SZ 6 (GLOVE) IMPLANT
GLOVE BIO SURGEON STRL SZ 6.5 (GLOVE) ×16 IMPLANT
GLOVE BIO SURGEON STRL SZ7 (GLOVE) IMPLANT
GLOVE BIO SURGEON STRL SZ7.5 (GLOVE) ×4 IMPLANT
GLOVE BIOGEL PI IND STRL 6 (GLOVE) IMPLANT
GLOVE BIOGEL PI IND STRL 6.5 (GLOVE) ×12 IMPLANT
GLOVE BIOGEL PI IND STRL 7.0 (GLOVE) ×12 IMPLANT
GLOVE BIOGEL PI INDICATOR 6 (GLOVE)
GLOVE BIOGEL PI INDICATOR 6.5 (GLOVE) ×4
GLOVE BIOGEL PI INDICATOR 7.0 (GLOVE) ×4
GLOVE EUDERMIC 7 POWDERFREE (GLOVE) ×8 IMPLANT
GLOVE ORTHO TXT STRL SZ7.5 (GLOVE) IMPLANT
GOWN STRL REUS W/ TWL LRG LVL3 (GOWN DISPOSABLE) ×24 IMPLANT
GOWN STRL REUS W/ TWL XL LVL3 (GOWN DISPOSABLE) ×3 IMPLANT
GOWN STRL REUS W/TWL LRG LVL3 (GOWN DISPOSABLE) ×8
GOWN STRL REUS W/TWL XL LVL3 (GOWN DISPOSABLE) ×1
HEMOSTAT POWDER SURGIFOAM 1G (HEMOSTASIS) ×12 IMPLANT
HEMOSTAT SURGICEL 2X14 (HEMOSTASIS) ×4 IMPLANT
HOVERMATT SINGLE USE (MISCELLANEOUS) ×4 IMPLANT
INSERT FOGARTY 61MM (MISCELLANEOUS) IMPLANT
INSERT FOGARTY XLG (MISCELLANEOUS) IMPLANT
KIT BASIN OR (CUSTOM PROCEDURE TRAY) ×4 IMPLANT
KIT CATH CPB BARTLE (MISCELLANEOUS) ×4 IMPLANT
KIT ROOM TURNOVER OR (KITS) ×4 IMPLANT
KIT SUCTION CATH 14FR (SUCTIONS) ×4 IMPLANT
KIT VASOVIEW ACCESSORY VH 2004 (KITS) ×4 IMPLANT
KIT VASOVIEW W/TROCAR VH 2000 (KITS) ×4 IMPLANT
NS IRRIG 1000ML POUR BTL (IV SOLUTION) ×24 IMPLANT
PACK OPEN HEART (CUSTOM PROCEDURE TRAY) ×4 IMPLANT
PAD ARMBOARD 7.5X6 YLW CONV (MISCELLANEOUS) ×8 IMPLANT
PAD ELECT DEFIB RADIOL ZOLL (MISCELLANEOUS) ×4 IMPLANT
PENCIL BUTTON HOLSTER BLD 10FT (ELECTRODE) ×4 IMPLANT
PUNCH AORTIC ROTATE 4.0MM (MISCELLANEOUS) IMPLANT
PUNCH AORTIC ROTATE 4.5MM 8IN (MISCELLANEOUS) ×4 IMPLANT
PUNCH AORTIC ROTATE 5MM 8IN (MISCELLANEOUS) IMPLANT
SET CARDIOPLEGIA MPS 5001102 (MISCELLANEOUS) ×4 IMPLANT
SPONGE GAUZE 4X4 12PLY STER LF (GAUZE/BANDAGES/DRESSINGS) ×8 IMPLANT
SPONGE INTESTINAL PEANUT (DISPOSABLE) IMPLANT
SPONGE LAP 18X18 X RAY DECT (DISPOSABLE) ×8 IMPLANT
SPONGE LAP 4X18 X RAY DECT (DISPOSABLE) IMPLANT
STRIP CLOSURE SKIN 1/2X4 (GAUZE/BANDAGES/DRESSINGS) ×4 IMPLANT
SUT BONE WAX W31G (SUTURE) ×4 IMPLANT
SUT MNCRL AB 4-0 PS2 18 (SUTURE) IMPLANT
SUT PROLENE 3 0 SH DA (SUTURE) IMPLANT
SUT PROLENE 3 0 SH1 36 (SUTURE) ×4 IMPLANT
SUT PROLENE 4 0 RB 1 (SUTURE) ×2
SUT PROLENE 4 0 SH DA (SUTURE) IMPLANT
SUT PROLENE 4-0 RB1 .5 CRCL 36 (SUTURE) ×6 IMPLANT
SUT PROLENE 5 0 C 1 36 (SUTURE) IMPLANT
SUT PROLENE 6 0 C 1 30 (SUTURE) ×4 IMPLANT
SUT PROLENE 7 0 BV 1 (SUTURE) IMPLANT
SUT PROLENE 7 0 BV1 MDA (SUTURE) ×8 IMPLANT
SUT PROLENE 8 0 BV175 6 (SUTURE) ×8 IMPLANT
SUT SILK  1 MH (SUTURE)
SUT SILK 1 MH (SUTURE) IMPLANT
SUT STEEL STERNAL CCS#1 18IN (SUTURE) IMPLANT
SUT STEEL SZ 6 DBL 3X14 BALL (SUTURE) ×12 IMPLANT
SUT VIC AB 1 CTX 36 (SUTURE) ×2
SUT VIC AB 1 CTX36XBRD ANBCTR (SUTURE) ×6 IMPLANT
SUT VIC AB 2-0 CT1 27 (SUTURE) ×1
SUT VIC AB 2-0 CT1 TAPERPNT 27 (SUTURE) ×3 IMPLANT
SUT VIC AB 2-0 CTX 27 (SUTURE) IMPLANT
SUT VIC AB 3-0 SH 27 (SUTURE)
SUT VIC AB 3-0 SH 27X BRD (SUTURE) IMPLANT
SUT VIC AB 3-0 X1 27 (SUTURE) ×4 IMPLANT
SUT VICRYL 4-0 PS2 18IN ABS (SUTURE) IMPLANT
SUTURE E-PAK OPEN HEART (SUTURE) ×4 IMPLANT
SYS ATRICLIP LAA EXCLUSION 40 (Clip) ×4 IMPLANT
SYSTEM SAHARA CHEST DRAIN ATS (WOUND CARE) ×4 IMPLANT
TAPE CLOTH SURG 4X10 WHT LF (GAUZE/BANDAGES/DRESSINGS) ×4 IMPLANT
TAPE PAPER 2X10 WHT MICROPORE (GAUZE/BANDAGES/DRESSINGS) ×4 IMPLANT
TOWEL OR 17X24 6PK STRL BLUE (TOWEL DISPOSABLE) ×4 IMPLANT
TOWEL OR 17X26 10 PK STRL BLUE (TOWEL DISPOSABLE) ×4 IMPLANT
TRAY FOLEY IC TEMP SENS 16FR (CATHETERS) ×4 IMPLANT
TUBING INSUFFLATION (TUBING) ×4 IMPLANT
UNDERPAD 30X30 INCONTINENT (UNDERPADS AND DIAPERS) ×4 IMPLANT
WATER STERILE IRR 1000ML POUR (IV SOLUTION) ×8 IMPLANT

## 2015-04-13 NOTE — Anesthesia Postprocedure Evaluation (Signed)
Anesthesia Post Note  Patient: Charles Mckinney  Procedure(s) Performed: Procedure(s) (LRB): CORONARY ARTERY BYPASS GRAFTING (CABG) (N/A) TRANSESOPHAGEAL ECHOCARDIOGRAM (TEE) (N/A) CLIPPING OF ATRIAL APPENDAGE  Anesthesia type: General  Patient location: ICU  Post pain: Pain level controlled  Post assessment: Post-op Vital signs reviewed  Last Vitals:  Filed Vitals:   04/13/15 0400  BP: 125/76  Pulse: 88  Temp: 36.8 C  Resp: 18    Post vital signs: stable  Level of consciousness: Patient remains intubated per anesthesia plan  Complications: No apparent anesthesia complications

## 2015-04-13 NOTE — Progress Notes (Signed)
Patient ID: Charles Mckinney, male   DOB: 1951/03/19, 64 y.o.   MRN: [id number] EVENING ROUNDS NOTE :     [address]       [name] [address]             [phone]                 Day of Surgery Procedure(s) (LRB): CORONARY ARTERY BYPASS GRAFTING (CABG) (N/A) TRANSESOPHAGEAL ECHOCARDIOGRAM (TEE) (N/A) CLIPPING OF ATRIAL APPENDAGE  Total Length of Stay:  LOS: 8 days  BP 87/48 mmHg  Pulse 80  Temp(Src) 98.3 F (36.8 C) (Oral)  Resp 27  Ht 5\' 10"  (1.778 m)  Wt 330 lb 11.2 oz (150.005 kg)  BMI 47.45 kg/m2  SpO2 100%  .Intake/Output      05/08 0701 - 05/09 0700 05/09 0701 - 05/10 0700   P.O. 1080    I.V. (mL/kg)  3100 (20.7)   Blood  759   Total Intake(mL/kg) 1080 (7.2) 3859 (25.7)   Urine (mL/kg/hr) 1800 (0.5) 650 (0.4)   Stool     Total Output 1800 650   Net -720 +3209          . sodium chloride 20 mL/hr at 04/13/15 1701  . [START ON 04/14/2015] sodium chloride    . sodium chloride 20 mL/hr at 04/13/15 1615  . amiodarone 30 mg/hr (04/13/15 1733)  . dexmedetomidine 0.7 mcg/kg/hr (04/13/15 1615)  . DOPamine Stopped (04/13/15 1615)  . insulin (NOVOLIN-R) infusion 1.2 Units/hr (04/13/15 1615)  . lactated ringers    . lactated ringers 20 mL/hr at 04/13/15 1615  . nitroGLYCERIN Stopped (04/13/15 1615)  . phenylephrine (NEO-SYNEPHRINE) Adult infusion 15 mcg/min (04/13/15 1615)     Lab Results  Component Value Date   WBC 13.5* 04/13/2015   HGB 11.9* 04/13/2015   HCT 36.4* 04/13/2015   PLT 143* 04/13/2015   GLUCOSE 121* 04/13/2015   CHOL 145 02/01/2014   TRIG 172* 02/01/2014   HDL 38* 02/01/2014   LDLDIRECT 164.8 09/12/2006   LDLCALC 73 02/01/2014   ALT 23 04/11/2015   AST 24 04/11/2015   NA 138 04/13/2015   K 4.3 04/13/2015   CL 101 04/13/2015   CREATININE 0.90 04/13/2015   BUN 18 04/13/2015   CO2 24 04/13/2015   TSH 5.204* 04/05/2015   PSA 0.21 09/12/2006   INR 1.06 04/09/2015   HGBA1C 5.8* 02/01/2014   Early post  Op Not bleeding    Hemodynamics stable   Delight Ovens MD  Beeper [phone] Office [phone] 04/13/2015 5:34 PM

## 2015-04-13 NOTE — Procedures (Signed)
Extubation Procedure Note  Patient Details:   Name: Charles Mckinney DOB: 1951/06/04 MRN: [id number]   Airway Documentation:  Airway 8 mm (Active)  Secured at (cm) 24 cm 04/13/2015  9:06 PM  Measured From Lips 04/13/2015  9:06 PM  Secured Location Right 04/13/2015  9:06 PM  Secured By Pink Tape 04/13/2015  9:06 PM  Site Condition Dry 04/13/2015  8:23 PM    Evaluation  O2 sats: stable throughout Complications: No apparent complications Patient did tolerate procedure well. Bilateral Breath Sounds: Clear   Yes   Patient performed NIF -30, VC 1.2L with very minimal cuff leak.  Dr. Tyrone Sage ok'd patient being extubated with minimal cuff leak. Patient was extubated to a 6 L [place] and performing IS with volumes of 250-500.  RT issued a flutter valve.  Epifanio Lesches A 04/13/2015, 10:55 PM

## 2015-04-13 NOTE — Progress Notes (Signed)
Patient ID: Charles Mckinney, male   DOB: [birth year], 64 y.o.   MRN: [id number]  SICU Evening Rounds:   Hemodynamically stable  CI = 1.5. Dopamine restarted  Still asleep on vent   Urine output good  CT output low  CBC    Component Value Date/Time   WBC 8.1 04/13/2015 0329   RBC 4.70 04/13/2015 0329   HGB 12.2* 04/13/2015 1654   HCT 36.0* 04/13/2015 1654   PLT 127* 04/13/2015 1420   MCV 88.1 04/13/2015 0329   MCH 29.1 04/13/2015 0329   MCHC 33.1 04/13/2015 0329   RDW 14.5 04/13/2015 0329   LYMPHSABS 1.7 04/05/2015 1900   MONOABS 0.6 04/05/2015 1900   EOSABS 0.2 04/05/2015 1900   BASOSABS 0.0 04/05/2015 1900     BMET    Component Value Date/Time   NA 138 04/13/2015 1654   K 4.3 04/13/2015 1654   CL 101 04/13/2015 1516   CO2 24 04/13/2015 0329   GLUCOSE 121* 04/13/2015 1654   GLUCOSE 99 09/12/2006 1309   BUN 18 04/13/2015 1516   CREATININE 0.90 04/13/2015 1516   CALCIUM 9.2 04/13/2015 0329   GFRNONAA >60 04/13/2015 0329   GFRAA >60 04/13/2015 0329     A/P:  Stable postop course. Continue current plans

## 2015-04-13 NOTE — Progress Notes (Signed)
Spoke with Dr Laneta Simmers about pt receiving Dulcolax last night. Dr aware and said that was  Not a concern

## 2015-04-13 NOTE — Brief Op Note (Signed)
04/05/2015 - 04/13/2015      [address]       [name] [address]             [phone]     04/05/2015 - 04/13/2015  2:33 PM  PATIENT:  Charles Mckinney  64 y.o. male  PRE-OPERATIVE DIAGNOSIS:  coronary artery disease  POST-OPERATIVE DIAGNOSIS:  coronary artery disease  PROCEDURE:  Procedure(s): CORONARY ARTERY BYPASS GRAFTING (CABG)X4 LIMA-LAD; SEQ SVG-PD/PL; SVG-DIAG TRANSESOPHAGEAL ECHOCARDIOGRAM (TEE) CLIPPING OF ATRIAL APPENDAGE EVH: RIGHT THIGH  SURGEON:  Surgeon(s): Alleen Borne, MD  PHYSICIAN ASSISTANT: Dreon Pineda PA-C  ANESTHESIA:   general  PATIENT CONDITION:  ICU - intubated and hemodynamically stable.  PRE-OPERATIVE WEIGHT: 150kg  COMPLICATIONS: NO KNOWN  EBL: SEE ANEST/PERFUSION RECORDS

## 2015-04-13 NOTE — OR Nursing (Signed)
Late entry for delay code documentation. 

## 2015-04-13 NOTE — Anesthesia Procedure Notes (Signed)
Procedure Name: Intubation Date/Time: 04/13/2015 9:53 AM Performed by: Carmela Rima Pre-anesthesia Checklist: Patient being monitored, Suction available, Emergency Drugs available, Patient identified and Timeout performed Patient Re-evaluated:Patient Re-evaluated prior to inductionOxygen Delivery Method: Circle system utilized Preoxygenation: Pre-oxygenation with 100% oxygen Intubation Type: IV induction Ventilation: Mask ventilation without difficulty Laryngoscope Size: Mac and 3 Grade View: Grade II Tube type: Oral Tube size: 8.0 mm Number of attempts: 1 Placement Confirmation: positive ETCO2,  ETT inserted through vocal cords under direct vision and breath sounds checked- equal and bilateral Secured at: 24 cm Tube secured with: Tape Dental Injury: Teeth and Oropharynx as per pre-operative assessment

## 2015-04-13 NOTE — Progress Notes (Signed)
Echocardiogram Echocardiogram Transesophageal has been performed.  Dorothey Baseman 04/13/2015, 10:37 AM

## 2015-04-13 NOTE — Transfer of Care (Signed)
Immediate Anesthesia Transfer of Care Note  Patient: Charles Mckinney  Procedure(s) Performed: Procedure(s): CORONARY ARTERY BYPASS GRAFTING (CABG) (N/A) TRANSESOPHAGEAL ECHOCARDIOGRAM (TEE) (N/A) CLIPPING OF ATRIAL APPENDAGE  Patient Location: SICU  Anesthesia Type:General  Level of Consciousness: sedated and Patient remains intubated per anesthesia plan  Airway & Oxygen Therapy: Patient remains intubated per anesthesia plan and Patient placed on Ventilator (see vital sign flow sheet for setting)  Post-op Assessment: Report given to RN and Post -op Vital signs reviewed and stable  Post vital signs: Reviewed and stable  Last Vitals:  Filed Vitals:   04/13/15 0400  BP: 125/76  Pulse: 88  Temp: 36.8 C  Resp: 18    Complications: No apparent anesthesia complications

## 2015-04-13 NOTE — Anesthesia Preprocedure Evaluation (Addendum)
Anesthesia Evaluation  Patient identified by MRN, date of birth, ID band Patient awake    Reviewed: Allergy & Precautions, NPO status , [name] Chart, lab work & pertinent test results  History of Anesthesia Complications (+) PONV and history of anesthetic complications  Airway Mallampati: II  TM Distance: >3 FB Neck ROM: Full    Dental  (+) Poor Dentition, Missing, Dental Advisory Given, Loose   Pulmonary former smoker,    Pulmonary exam normal       Cardiovascular hypertension, Pt. on medications +CHF Normal cardiovascular exam+ dysrhythmias Atrial Fibrillation     Neuro/Psych Anxiety negative neurological ROS     GI/Hepatic Neg liver ROS, GERD-  ,  Endo/Other  Morbid obesity  Renal/GU Renal InsufficiencyRenal disease     Musculoskeletal  (+) Arthritis -,   Abdominal   Peds  Hematology   Anesthesia Other Findings   Reproductive/Obstetrics                          Anesthesia Physical Anesthesia Plan  ASA: IV  Anesthesia Plan: General   Post-op Pain Management:    Induction: Intravenous  Airway Management Planned: Oral ETT  Additional Equipment: Arterial line, 3D TEE, Ultrasound Guidance Line Placement and PA Cath  Intra-op Plan:   Post-operative Plan: Possible Post-op intubation/ventilation  Informed Consent: I have reviewed the patients History and Physical, chart, labs and discussed the procedure including the risks, benefits and alternatives for the proposed anesthesia with the patient or authorized representative who has indicated his/her understanding and acceptance.   Dental advisory given and Dental Advisory Given  Plan Discussed with: CRNA, Anesthesiologist and Surgeon  Anesthesia Plan Comments:       Anesthesia Quick Evaluation

## 2015-04-14 ENCOUNTER — Inpatient Hospital Stay (HOSPITAL_COMMUNITY): Payer: Non-veteran care

## 2015-04-14 ENCOUNTER — Encounter (HOSPITAL_COMMUNITY): Payer: Self-pay | Admitting: Surgery

## 2015-04-14 LAB — CREATININE, SERUM
Creatinine, Ser: 1.07 mg/dL (ref 0.61–1.24)
GFR calc Af Amer: >60 mL/min (ref >60)
GFR calc non Af Amer: >60 mL/min (ref >60)

## 2015-04-14 LAB — GLUCOSE, CAPILLARY
GLUCOSE-CAPILLARY: 103 mg/dL — AB (ref 70–99)
GLUCOSE-CAPILLARY: 125 mg/dL — AB (ref 70–99)
GLUCOSE-CAPILLARY: 159 mg/dL — AB (ref 70–99)
GLUCOSE-CAPILLARY: 135 mg/dL — AB (ref 70–99)
GLUCOSE-CAPILLARY: 119 mg/dL — AB (ref 70–99)
GLUCOSE-CAPILLARY: 116 mg/dL — AB (ref 70–99)
GLUCOSE-CAPILLARY: 109 mg/dL — AB (ref 70–99)
GLUCOSE-CAPILLARY: 108 mg/dL — AB (ref 70–99)
GLUCOSE-CAPILLARY: 135 mg/dL — AB (ref 70–99)
Glucose-Capillary: 107 mg/dL — ABNORMAL HIGH (ref 70–99)
Glucose-Capillary: 109 mg/dL — ABNORMAL HIGH (ref 70–99)
Glucose-Capillary: 110 mg/dL — ABNORMAL HIGH (ref 70–99)
Glucose-Capillary: 120 mg/dL — ABNORMAL HIGH (ref 70–99)
Glucose-Capillary: 123 mg/dL — ABNORMAL HIGH (ref 70–99)
Glucose-Capillary: 140 mg/dL — ABNORMAL HIGH (ref 70–99)
Glucose-Capillary: 156 mg/dL — ABNORMAL HIGH (ref 70–99)
Glucose-Capillary: 161 mg/dL — ABNORMAL HIGH (ref 70–99)
Glucose-Capillary: 167 mg/dL — ABNORMAL HIGH (ref 70–99)
Glucose-Capillary: 97 mg/dL (ref 70–99)

## 2015-04-14 LAB — CK TOTAL AND CKMB (NOT AT ARMC)
CK TOTAL: 267 U/L (ref 49–397)
CK, MB: 11.7 ng/mL — AB (ref 0.5–5.0)
CK, MB: 7.2 ng/mL — AB (ref 0.5–5.0)
Relative Index: 4.4 — ABNORMAL HIGH (ref 0.0–2.5)
Relative Index: 3.6 — ABNORMAL HIGH (ref 0.0–2.5)
Total CK: 200 U/L (ref 49–397)

## 2015-04-14 LAB — CBC
HCT: 36.5 % — ABNORMAL LOW (ref 39.0–52.0)
HEMATOCRIT: 30.6 % — AB (ref 39.0–52.0)
HEMOGLOBIN: 10 g/dL — AB (ref 13.0–17.0)
Hemoglobin: 12 g/dL — ABNORMAL LOW (ref 13.0–17.0)
MCH: 28.9 pg (ref 26.0–34.0)
MCH: 29 pg (ref 26.0–34.0)
MCHC: 32.9 g/dL (ref 30.0–36.0)
MCHC: 32.7 g/dL (ref 30.0–36.0)
MCV: 88 fL (ref 78.0–100.0)
MCV: 88.7 fL (ref 78.0–100.0)
PLATELETS: 168 10*3/uL (ref 150–400)
Platelets: 132 10*3/uL — ABNORMAL LOW (ref 150–400)
RBC: 4.15 MIL/uL — AB (ref 4.22–5.81)
RBC: 3.45 MIL/uL — AB (ref 4.22–5.81)
RDW: 14.6 % (ref 11.5–15.5)
RDW: 14.8 % (ref 11.5–15.5)
WBC: 11.3 10*3/uL — AB (ref 4.0–10.5)
WBC: 9.2 10*3/uL (ref 4.0–10.5)

## 2015-04-14 LAB — POCT I-STAT, CHEM 8
BUN: 16 mg/dL (ref 6–20)
CHLORIDE: 98 mmol/L — AB (ref 101–111)
Calcium, Ion: 1.17 mmol/L (ref 1.13–1.30)
Creatinine, Ser: 1 mg/dL (ref 0.61–1.24)
Glucose, Bld: 166 mg/dL — ABNORMAL HIGH (ref 70–99)
HCT: 31 % — ABNORMAL LOW (ref 39.0–52.0)
Hemoglobin: 10.5 g/dL — ABNORMAL LOW (ref 13.0–17.0)
POTASSIUM: 3.7 mmol/L (ref 3.5–5.1)
SODIUM: 135 mmol/L (ref 135–145)
TCO2: 21 mmol/L (ref 0–100)

## 2015-04-14 LAB — BASIC METABOLIC PANEL
Anion gap: 7 (ref 5–15)
BUN: 13 mg/dL (ref 6–20)
CO2: 23 mmol/L (ref 22–32)
CREATININE: 1.03 mg/dL (ref 0.61–1.24)
Calcium: 7.7 mg/dL — ABNORMAL LOW (ref 8.9–10.3)
Chloride: 105 mmol/L (ref 101–111)
GFR calc non Af Amer: >60 mL/min (ref >60)
Glucose, Bld: 126 mg/dL — ABNORMAL HIGH (ref 70–99)
POTASSIUM: 3.9 mmol/L (ref 3.5–5.1)
SODIUM: 135 mmol/L (ref 135–145)

## 2015-04-14 LAB — HEMOGLOBIN A1C
Hgb A1c MFr Bld: 6.2 % — ABNORMAL HIGH (ref 4.8–5.6)
MEAN PLASMA GLUCOSE: 131 mg/dL

## 2015-04-14 LAB — TROPONIN I
TROPONIN I: 1.3 ng/mL — AB (ref <0.031)
TROPONIN I: 0.73 ng/mL — AB (ref <0.031)

## 2015-04-14 LAB — MAGNESIUM
MAGNESIUM: 2.2 mg/dL (ref 1.7–2.4)
MAGNESIUM: 1.9 mg/dL (ref 1.7–2.4)

## 2015-04-14 MED ORDER — INSULIN ASPART 100 UNIT/ML ~~LOC~~ SOLN
0.0000 [IU] | SUBCUTANEOUS | Status: DC
  Administered 2015-04-14: 2 [IU] via SUBCUTANEOUS
  Administered 2015-04-14 – 2015-04-15 (×2): 4 [IU] via SUBCUTANEOUS

## 2015-04-14 MED ORDER — CARVEDILOL 6.25 MG PO TABS
6.2500 mg | ORAL_TABLET | Freq: Two times a day (BID) | ORAL | Status: DC
  Administered 2015-04-14 – 2015-04-21 (×16): 6.25 mg via ORAL
  Filled 2015-04-14 (×20): qty 1

## 2015-04-14 MED ORDER — INSULIN DETEMIR 100 UNIT/ML ~~LOC~~ SOLN
30.0000 [IU] | Freq: Every day | SUBCUTANEOUS | Status: DC
  Administered 2015-04-14 – 2015-04-15 (×2): 30 [IU] via SUBCUTANEOUS
  Filled 2015-04-14 (×3): qty 0.3

## 2015-04-14 MED ORDER — INSULIN DETEMIR 100 UNIT/ML ~~LOC~~ SOLN: 30.0000 [IU] | Freq: Every day | SUBCUTANEOUS | Status: DC

## 2015-04-14 MED ORDER — ENOXAPARIN SODIUM 40 MG/0.4ML ~~LOC~~ SOLN
40.0000 mg | Freq: Every day | SUBCUTANEOUS | Status: DC
  Administered 2015-04-14 – 2015-04-20 (×7): 40 mg via SUBCUTANEOUS
  Filled 2015-04-14 (×9): qty 0.4

## 2015-04-14 MED ORDER — POTASSIUM CHLORIDE 10 MEQ/50ML IV SOLN
10.0000 meq | INTRAVENOUS | Status: AC | PRN
  Administered 2015-04-14 (×3): 10 meq via INTRAVENOUS
  Filled 2015-04-14 (×3): qty 50

## 2015-04-14 MED ORDER — PRAVASTATIN SODIUM 40 MG PO TABS
40.0000 mg | ORAL_TABLET | Freq: Every day | ORAL | Status: DC
  Administered 2015-04-14 – 2015-04-21 (×8): 40 mg via ORAL
  Filled 2015-04-14 (×9): qty 1

## 2015-04-14 MED ORDER — KETOROLAC TROMETHAMINE 15 MG/ML IJ SOLN
15.0000 mg | Freq: Four times a day (QID) | INTRAMUSCULAR | Status: AC | PRN
  Administered 2015-04-14 – 2015-04-15 (×5): 15 mg via INTRAVENOUS
  Filled 2015-04-14 (×5): qty 1

## 2015-04-14 MED FILL — Electrolyte-R (PH 7.4) Solution: INTRAVENOUS | Qty: 6000 | Status: AC

## 2015-04-14 MED FILL — Potassium Chloride Inj 2 mEq/ML: INTRAVENOUS | Qty: 40 | Status: AC

## 2015-04-14 MED FILL — Lidocaine HCl IV Inj 20 MG/ML: INTRAVENOUS | Qty: 5 | Status: AC

## 2015-04-14 MED FILL — Sodium Bicarbonate IV Soln 8.4%: INTRAVENOUS | Qty: 50 | Status: AC

## 2015-04-14 MED FILL — Magnesium Sulfate Inj 50%: INTRAMUSCULAR | Qty: 10 | Status: AC

## 2015-04-14 MED FILL — Heparin Sodium (Porcine) Inj 1000 Unit/ML: INTRAMUSCULAR | Qty: 30 | Status: AC

## 2015-04-14 MED FILL — Heparin Sodium (Porcine) Inj 1000 Unit/ML: INTRAMUSCULAR | Qty: 10 | Status: AC

## 2015-04-14 MED FILL — Mannitol IV Soln 20%: INTRAVENOUS | Qty: 500 | Status: AC

## 2015-04-14 MED FILL — Sodium Chloride IV Soln 0.9%: INTRAVENOUS | Qty: 2000 | Status: AC

## 2015-04-14 NOTE — Progress Notes (Signed)
Ref: Default, Provider, MD   Subjective:  Day 1 post op 4 vessel CABG- LIMA to LAD, SVG to diagonal, Sequential SVG to PL and PDA of RCA. Pain from moving too soon per patient. T max 100.2 degree F.   Objective:  Vital Signs in the last 24 hours: Temp:  [97.2 F (36.2 C)-100.2 F (37.9 C)] 99 F (37.2 C) (05/10 0800) Pulse Rate:  [80-113] 107 (05/10 0800) Cardiac Rhythm:  [-] Normal sinus rhythm (05/10 0800) Resp:  [12-31] 20 (05/10 0800) BP: (86-124)/(43-97) 111/68 mmHg (05/10 0800) SpO2:  [93 %-100 %] 95 % (05/10 0800) Arterial Line BP: (62-157)/(49-133) 84/81 mmHg (05/10 0800) FiO2 (%):  [6 %-50 %] 6 % (05/09 2206) Weight:  [149.4 kg (329 lb 5.9 oz)] 149.4 kg (329 lb 5.9 oz) (05/10 0345)  Physical Exam: BP Readings from Last 1 Encounters:  04/14/15 111/68    Wt Readings from Last 1 Encounters:  04/14/15 149.4 kg (329 lb 5.9 oz)    Weight change: -0.604 kg (-1 lb 5.3 oz)  HEENT: Farmer/AT, Eyes-Hazel, PERL, EOMI, Conjunctiva-Pink, Sclera-Non-icteric Neck: No JVD, No bruit, Trachea midline. Lungs:  Fine basal crackles, Bilateral. Cardiac:  Regular rhythm, normal S1 and S2, no S3. II/VI systolic murmur. Abdomen:  Soft, non-tender. Extremities:  2 + edema of both lower legs is present. No cyanosis. No clubbing. CNS: AxOx3, Cranial nerves grossly intact, moves all 4 extremities. Right handed. Skin: Warm and dry.   Intake/Output from previous day: 05/09 0701 - 05/10 0700 In: 6070.7 [I.V.:4801.7; Blood:759; NG/GT:60; IV Piggyback:450] Out: 2840 [Urine:2490; Emesis/NG output:30; Chest Tube:320]    Lab Results: BMET    Component Value Date/Time   NA 135 04/14/2015 0426   NA 138 04/13/2015 2150   NA 138 04/13/2015 1654   K 3.9 04/14/2015 0426   K 4.3 04/13/2015 2150   K 4.3 04/13/2015 1654   CL 105 04/14/2015 0426   CL 104 04/13/2015 2150   CL 101 04/13/2015 1516   CO2 23 04/14/2015 0426   CO2 24 04/13/2015 0329   CO2 27 04/11/2015 0608   GLUCOSE 126* 04/14/2015 0426    GLUCOSE 161* 04/13/2015 2150   GLUCOSE 121* 04/13/2015 1654   GLUCOSE 99 09/12/2006 1309   BUN 13 04/14/2015 0426   BUN 17 04/13/2015 2150   BUN 18 04/13/2015 1516   CREATININE 1.03 04/14/2015 0426   CREATININE 0.92 04/13/2015 2155   CREATININE 0.90 04/13/2015 2150   CALCIUM 7.7* 04/14/2015 0426   CALCIUM 9.2 04/13/2015 0329   CALCIUM 9.0 04/11/2015 0608   GFRNONAA >60 04/14/2015 0426   GFRNONAA >60 04/13/2015 2155   GFRNONAA >60 04/13/2015 0329   GFRAA >60 04/14/2015 0426   GFRAA >60 04/13/2015 2155   GFRAA >60 04/13/2015 0329   CBC    Component Value Date/Time   WBC 11.3* 04/14/2015 0426   RBC 4.15* 04/14/2015 0426   HGB 12.0* 04/14/2015 0426   HCT 36.5* 04/14/2015 0426   PLT 168 04/14/2015 0426   MCV 88.0 04/14/2015 0426   MCH 28.9 04/14/2015 0426   MCHC 32.9 04/14/2015 0426   RDW 14.6 04/14/2015 0426   LYMPHSABS 1.7 04/05/2015 1900   MONOABS 0.6 04/05/2015 1900   EOSABS 0.2 04/05/2015 1900   BASOSABS 0.0 04/05/2015 1900   HEPATIC Function Panel  Recent Labs  01/13/15 0120 04/05/15 1900 04/11/15 0608  PROT 5.9* 6.3* 6.5   HEMOGLOBIN A1C No components found for: HGA1C,  MPG CARDIAC ENZYMES Lab Results  Component Value Date   CKTOTAL  267 04/14/2015   CKMB 11.7* 04/14/2015   TROPONINI 1.30* 04/14/2015   TROPONINI <0.03 04/13/2015   TROPONINI 0.03 04/06/2015   BNP No results for input(s): PROBNP in the last 8760 hours. TSH  Recent Labs  01/14/15 1140 04/05/15 1900  TSH 3.727 5.204*   CHOLESTEROL No results for input(s): CHOL in the last 8760 hours.  Scheduled Meds: . acetaminophen  1,000 mg Oral 4 times per day   Or  . acetaminophen (TYLENOL) oral liquid 160 mg/5 mL  1,000 mg Per Tube 4 times per day  . aspirin EC  325 mg Oral Daily   Or  . aspirin  324 mg Per Tube Daily  . bisacodyl  10 mg Oral Daily   Or  . bisacodyl  10 mg Rectal Daily  . carvedilol  6.25 mg Oral BID WC  . cefUROXime (ZINACEF)  IV  1.5 g Intravenous Q12H  .  docusate sodium  200 mg Oral Daily  . enoxaparin (LOVENOX) injection  40 mg Subcutaneous QHS  . famotidine (PEPCID) IV  20 mg Intravenous Q12H  . insulin aspart  0-24 Units Subcutaneous 6 times per day  . insulin detemir  30 Units Subcutaneous Daily  . insulin regular  0-10 Units Intravenous TID WC  . [START ON 04/15/2015] pantoprazole  40 mg Oral Daily  . pravastatin  40 mg Oral q1800  . sodium chloride  3 mL Intravenous Q12H   Continuous Infusions: . sodium chloride 20 mL/hr at 04/14/15 0800  . sodium chloride    . sodium chloride Stopped (04/14/15 0800)  . amiodarone 30 mg/hr (04/14/15 0800)  . insulin (NOVOLIN-R) infusion 2.5 Units/hr (04/14/15 0800)  . lactated ringers    . lactated ringers 20 mL/hr at 04/14/15 0800  . nitroGLYCERIN Stopped (04/14/15 0800)  . phenylephrine (NEO-SYNEPHRINE) Adult infusion Stopped (04/13/15 1730)   PRN Meds:.sodium chloride, albumin human, ketorolac, morphine injection, ondansetron (ZOFRAN) IV, oxyCODONE, sodium chloride, traMADol  Assessment/Plan:  Acute on chronic left heart systolic failure. Severe 3 Vessel coronary artery disease 4 vessel CABG-post op day 1 Atrial fibrillation, recurrent. Morbid obesity Hyperlipidemia Hypertension Hypokalemia Prolonged QTc. GERD  Follow with surgery.   LOS: 9 days    Orpah Cobb  MD  04/14/2015, 9:18 AM

## 2015-04-14 NOTE — Progress Notes (Signed)
      [address]       [place] [address]             [phone]      Resting comfortably  BP 102/61 mmHg  Pulse 83  Temp(Src) 98.8 F (37.1 C) (Oral)  Resp 20  Ht 5\' 10"  (1.778 m)  Wt 329 lb 5.9 oz (149.4 kg)  BMI 47.26 kg/m2  SpO2 95%   Intake/Output Summary (Last 24 hours) at 04/14/15 1737 Last data filed at 04/14/15 1600  Gross per 24 hour  Intake 2982.21 ml  Output   2450 ml  Net 532.21 ml   K= 3.7 Hct= 31  Doing well POD # 1  Elliot Meldrum C. Dorris Fetch, MD [facility] [phone]

## 2015-04-14 NOTE — Progress Notes (Signed)
Update given to April at the [facility]

## 2015-04-14 NOTE — Progress Notes (Signed)
1 Day Post-Op Procedure(s) (LRB): CORONARY ARTERY BYPASS GRAFTING (CABG) (N/A) TRANSESOPHAGEAL ECHOCARDIOGRAM (TEE) (N/A) CLIPPING OF ATRIAL APPENDAGE Subjective:  Complains of pain  Objective: Vital signs in last 24 hours: Temp:  [97.2 F (36.2 C)-100.2 F (37.9 C)] 99.1 F (37.3 C) (05/10 0715) Pulse Rate:  [77-113] 104 (05/10 0715) Cardiac Rhythm:  [-] Sinus tachycardia (05/10 0600) Resp:  [12-31] 15 (05/10 0715) BP: (86-124)/(43-97) 113/65 mmHg (05/10 0715) SpO2:  [93 %-100 %] 95 % (05/10 0715) Arterial Line BP: (62-157)/(49-133) 76/73 mmHg (05/10 0700) FiO2 (%):  [6 %-50 %] 6 % (05/09 2206) Weight:  [149.4 kg (329 lb 5.9 oz)] 149.4 kg (329 lb 5.9 oz) (05/10 0345)  Hemodynamic parameters for last 24 hours: PAP: (22-48)/(10-33) 23/19 mmHg CO:  [3.6 L/min-7.4 L/min] 7.4 L/min CI:  [1.7 L/min/m2-2.9 L/min/m2] 2.9 L/min/m2  Intake/Output from previous day: 05/09 0701 - 05/10 0700 In: 6070.7 [I.V.:4801.7; Blood:759; NG/GT:60; IV Piggyback:450] Out: 2840 [Urine:2490; Emesis/NG output:30; Chest Tube:320] Intake/Output this shift:    General appearance: alert and cooperative Neurologic: intact Heart: regular rate and rhythm, S1, S2 normal, no murmur, click, rub or gallop Lungs: clear to auscultation bilaterally Extremities: edema mild Wound: dressing dry  Lab Results:  Recent Labs  04/13/15 2155 04/14/15 0426  WBC 12.4* 11.3*  HGB 12.5* 12.0*  HCT 37.7* 36.5*  PLT 145* 168   BMET:  Recent Labs  04/13/15 0329  04/13/15 2150 04/13/15 2155 04/14/15 0426  NA 136  < > 138  --  135  K 4.0  < > 4.3  --  3.9  CL 102  < > 104  --  105  CO2 24  --   --   --  23  GLUCOSE 99  < > 161*  --  126*  BUN 21*  < > 17  --  13  CREATININE 1.17  < > 0.90 0.92 1.03  CALCIUM 9.2  --   --   --  7.7*  < > = values in this interval not displayed.  PT/INR:  Recent Labs  04/13/15 1655  LABPROT 15.6*  INR 1.23   ABG    Component Value Date/Time   PHART 7.345* 04/13/2015  2302   HCO3 24.1* 04/13/2015 2302   TCO2 25 04/13/2015 2302   ACIDBASEDEF 2.0 04/13/2015 2302   O2SAT 93.0 04/13/2015 2302   CBG (last 3)   Recent Labs  04/14/15 0413 04/14/15 0532 04/14/15 0631  GLUCAP 119* 120* 107*   CXR: bibasilar atelectasis  ECG: sinus rhythm, no acute changes  Assessment/Plan: S/P Procedure(s) (LRB): CORONARY ARTERY BYPASS GRAFTING (CABG) (N/A) TRANSESOPHAGEAL ECHOCARDIOGRAM (TEE) (N/A) CLIPPING OF ATRIAL APPENDAGE  He has been hemodynamically stable in sinus rhythm. Preop EF 30-35% by echo. Will resume Coreg and Lisinopril as BP allows. He has severe diffuse disease and will need maximum risk factor reduction.  Recent atrial fibrillation s/p DCCV x 2 on amiodarone since. Will continue IV amio today and resume oral tomorrow.  Resume Xarelto at discharge  Morbid obesity: Mobilize, IS,  Start lovenox for DVT prophylaxis and continue SCD's Diuresis Diabetes control: preop Hgb A1c was 6.2. Start Levemir and SSI d/c tubes/lines Continue foley due to diuresing patient and patient in ICU See progression orders   LOS: 9 days    Alleen Borne 04/14/2015

## 2015-04-14 NOTE — Progress Notes (Addendum)
CRITICAL VALUE ALERT  Critical value received:  Troponin 1.3 CK- 267  MB- 11.7  Date of notification:  04/14/15  Time of notification:  0609  Critical value read back: yes  Nurse who received alert:  A. Haggard RN  [employer] with Levo-CTS and let her know values. Study drug still running at 30mL/hour. No new orders at this time. Thresa Ross RN

## 2015-04-14 NOTE — Op Note (Signed)
CARDIOVASCULAR SURGERY OPERATIVE NOTE  04/13/2015  Surgeon:  Alleen Borne, MD  First Assistant: Gershon Crane,  PA-C   Preoperative Diagnosis:  Severe left main and multi-vessel coronary artery disease, Recurrent atrial fibrillation   Postoperative Diagnosis:  Same   Procedure:  1. Median Sternotomy 2. Extracorporeal circulation 3.   Coronary artery bypass grafting x 4   Left internal mammary graft to the LAD  SVG to diagonal  Sequential SVG to PL RCA and PDA RCA.  4.   Endoscopic vein harvest from the right leg 5.   Clipping of left atrial appendage   Anesthesia:  General Endotracheal   Clinical History/Surgical Indication:  The patient is a 64 year old morbidly obese gentleman with hypertension, a history of congestive heart failure secondary to systolic dysfunction with a know EF of 30-35% by echo on 02/01/2014, atrial fibrillation s/p cardioversion x 2 in February and then at the end of April, hypercholesterolemia, DJD who was readmitted on 5/1 with worsening shortness of breath that woke him up early in the morning. He has had shortness of breath, orthopnea, PND and leg swelling for a while but no recent chest pain. On presentation he was hypoxic with sats in the 80's and responded to bipap and lasix. 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 CXR on admission showed congestive heart failure with increased interstitial edema. Echo on 4/27 showed an EF of 30-35% with diffuse hypokinesis and severe hypokinesis of the mid-apical inferior myocardium. There was no AS or AI, no MR. Cardiac cath shows a 95% distal LM stenosis that extends into the LAD with 99% ostial LAD which is a smallish vessel. There is a diagonal with high grade ostial stenosis that is also on the small side. It also extends into the ostial LCX with 90% stenosis  and the LCX only has one OM branch that is small and probably not graftable. The RCA is a large dominant vessel with 80% proximal stenosis and 50% mid stenosis as well as 90% PDA stenosis. EF is about 25% with an LVEDP of 20. CI was 2.0-2.2. PAP was 37/11 with a wedge of 16.  He has high grade left main and severe 3-vessel coronary disease with severe LV dysfunction presenting with acute on chronic systolic congestive heart failure. He had a BNP of 793 with negative troponin. I agree that CABG is the best treatment for him although his LCX is small and may not be suitable for grafting. His LAD and Diagonal are on the small side but are probably graftable and the RCA is a large dominant vessel that is graftable. His operative risk is elevated due to his poor EF and morbid obesity. He has not had PFT's yet but he is likely to have significant abnormalities due to his obesity. I discussed the operative procedure with the patient including alternatives, benefits and risks; including but not limited to bleeding, blood transfusion, infection, stroke, myocardial infarction, graft failure, heart block requiring a permanent pacemaker, organ dysfunction, and death. He understands and agrees to proceed.  Preparation:  The patient was seen in the preoperative holding area and the correct patient, correct operation were confirmed with the patient after reviewing the medical record and catheterization. The consent was signed by me. Preoperative antibiotics were given. A pulmonary arterial line and radial arterial line were placed by the anesthesia team. The patient was taken back to the operating room and positioned  supine on the operating room table. After being placed under general endotracheal anesthesia by the anesthesia team a foley catheter was placed. The neck, chest, abdomen, and both legs were prepped with betadine soap and solution and draped in the usual sterile manner. A surgical time-out was taken and the  correct patient and operative procedure were confirmed with the nursing and anesthesia staff.  TEE: Performed by Dr. Adonis Huguenin  Pre-bypass this showed moderate LV dysfunction with an EF of 30%. There was mild MR. Post-bypass LV function appeared improved.   Cardiopulmonary Bypass:  A median sternotomy was performed. The pericardium was opened in the midline. Right ventricular function appeared normal. The ascending aorta was of normal size and had no palpable plaque. There were no contraindications to aortic cannulation or cross-clamping. The patient was fully systemically heparinized and the ACT was maintained > 400 sec. The proximal aortic arch was cannulated with a 24 F aortic cannula for arterial inflow. Venous cannulation was performed via the right atrial appendage using a two-staged venous cannula. An antegrade cardioplegia/vent cannula was inserted into the mid-ascending aorta. A retrograde cardioplegia cannula was inserted in the right atrium and directed into the coronary sinus without difficulty. Aortic occlusion was performed with a single cross-clamp. Systemic cooling to 32 degrees Centigrade and topical cooling of the heart with iced saline were used. Hyperkalemic antegrade and retrograde cold blood cardioplegia was used to induce diastolic arrest and was then given at about 20 minute intervals throughout the period of arrest to maintain myocardial temperature at or below 10 degrees centigrade. A temperature probe was inserted into the interventricular septum and an insulating pad was placed in the pericardium.   Left internal mammary harvest:  The left side of the sternum was retracted using the Rultract retractor. The left internal mammary artery was harvested as a pedicle graft. All side branches were clipped. It was a large-sized vessel of good quality with excellent blood flow. It was ligated distally and divided. It was sprayed with topical papaverine solution to prevent  vasospasm.   Endoscopic vein harvest:  The right greater saphenous vein was harvested endoscopically through a 2 cm incision medial to the right knee. It was harvested from the upper thigh to below the knee. Below the knee it divided into two smaller veins that were not large enough. It took a considerable amount of time to harvest this vein due to his large, obese legs and the difficulty maintaining an adequate tunnel. It was a medium-sized vein of good quality. The side branches were all ligated with 4-0 silk ties.    Coronary arteries:  The coronary arteries were examined.   LAD:  Small, diffusely diseased vessel that did not reach the apex. The diagonal was small and diffusely diseased.  LCX:  The OM branches were tiny, non-graftable vessels  RCA:  The RCA was diffusely diseased. The PDA was diffusely diseased but graftable distally. The PL was large with no significant disease.    Grafts:  1. LIMA to the LAD: 1.6 mm. It was sewn end to side using 8-0 prolene continuous suture. 2. SVG to diagonal:  1.6 mm. It was sewn end to side using 7-0 prolene continuous suture. 3. Sequential SVG to PL RCA:  1.75 mm. It was sewn sequential side to side using 7-0 prolene continuous suture. 4. Sequential SVG to PDA RCA:  1.6 mm. It was sewn sequential end to side using 7-0 prolene continuous suture.  The proximal vein graft anastomosis of the sequential RCA  graft was performed to the mid-ascending aorta using continuous 6-0 prolene suture. The vein graft to the diagonal was too short to reach the aorta and was therefore performed to the proximal aspect of the other vein graft in an end to side manner using continuous 7-0 prolene suture. A graft marker was placed around the proximal anastomosis on the aorta.  Clipping of the left atrial appendage:  The base of the left atrial appendage was measured and a 40 mm Atricure Atriclip was used. It has Model # LAAO 40, Lot # [id number]. It was placed at the  base of the LAA and closed.   Completion:  The patient was rewarmed to 37 degrees Centigrade. The clamp was removed from the LIMA pedicle and there was rapid warming of the septum and return of ventricular fibrillation. The crossclamp was removed with a time of 105 minutes. There was spontaneous return of sinus rhythm. The distal and proximal anastomoses were checked for hemostasis. The position of the grafts was satisfactory. Two temporary epicardial pacing wires were placed on the right atrium and two on the right ventricle. The patient was weaned from CPB without difficulty on dopamine 3 mcg/kg/min. CPB time was 130 minutes. Cardiac output was 6 LPM. Heparin was fully reversed with protamine and the aortic and venous cannulas removed. Hemostasis was achieved. Mediastinal and left pleural drainage tubes were placed. The sternum was closed with double #6 stainless steel wires. The fascia was closed with continuous # 1 vicryl suture. The subcutaneous tissue was closed with 2-0 vicryl continuous suture. The skin was closed with 3-0 vicryl subcuticular suture. All sponge, needle, and instrument counts were reported correct at the end of the case. Dry sterile dressings were placed over the incisions and around the chest tubes which were connected to pleurevac suction. The patient was then transported to the surgical intensive care unit in critical but stable condition.

## 2015-04-14 NOTE — Progress Notes (Signed)
Placebo/Levosimendan Infusion stopped at 1001 today per protocol.

## 2015-04-14 NOTE — Progress Notes (Signed)
Utilization review completed.  

## 2015-04-15 ENCOUNTER — Inpatient Hospital Stay (HOSPITAL_COMMUNITY): Payer: Non-veteran care

## 2015-04-15 LAB — GLUCOSE, CAPILLARY
GLUCOSE-CAPILLARY: 112 mg/dL — AB (ref 70–99)
Glucose-Capillary: 93 mg/dL (ref 70–99)
Glucose-Capillary: 114 mg/dL — ABNORMAL HIGH (ref 70–99)
Glucose-Capillary: 172 mg/dL — ABNORMAL HIGH (ref 70–99)
Glucose-Capillary: 97 mg/dL (ref 70–99)

## 2015-04-15 LAB — BRAIN NATRIURETIC PEPTIDE: B NATRIURETIC PEPTIDE 5: 369.9 pg/mL — AB (ref 0.0–100.0)

## 2015-04-15 LAB — CBC
HCT: 31.1 % — ABNORMAL LOW (ref 39.0–52.0)
Hemoglobin: 10.2 g/dL — ABNORMAL LOW (ref 13.0–17.0)
MCH: 29.1 pg (ref 26.0–34.0)
MCHC: 32.8 g/dL (ref 30.0–36.0)
MCV: 88.9 fL (ref 78.0–100.0)
PLATELETS: 144 10*3/uL — AB (ref 150–400)
RBC: 3.5 MIL/uL — AB (ref 4.22–5.81)
RDW: 14.9 % (ref 11.5–15.5)
WBC: 9.5 10*3/uL (ref 4.0–10.5)

## 2015-04-15 LAB — BASIC METABOLIC PANEL
Anion gap: 5 (ref 5–15)
BUN: 12 mg/dL (ref 6–20)
CALCIUM: 8 mg/dL — AB (ref 8.9–10.3)
CHLORIDE: 102 mmol/L (ref 101–111)
CO2: 27 mmol/L (ref 22–32)
CREATININE: 0.98 mg/dL (ref 0.61–1.24)
GFR calc non Af Amer: >60 mL/min (ref >60)
Glucose, Bld: 96 mg/dL (ref 70–99)
Potassium: 4.1 mmol/L (ref 3.5–5.1)
SODIUM: 134 mmol/L — AB (ref 135–145)

## 2015-04-15 LAB — CK TOTAL AND CKMB (NOT AT ARMC)
CK, MB: 5.5 ng/mL — AB (ref 0.5–5.0)
CK, MB: 5.5 ng/mL — ABNORMAL HIGH (ref 0.5–5.0)
RELATIVE INDEX: 3.7 — AB (ref 0.0–2.5)
Relative Index: 4.5 — ABNORMAL HIGH (ref 0.0–2.5)
Total CK: 149 U/L (ref 49–397)
Total CK: 121 U/L (ref 49–397)

## 2015-04-15 LAB — TROPONIN I
Troponin I: 0.6 ng/mL (ref <0.031)
Troponin I: 0.39 ng/mL — ABNORMAL HIGH (ref <0.031)

## 2015-04-15 MED ORDER — DOCUSATE SODIUM 100 MG PO CAPS
200.0000 mg | ORAL_CAPSULE | Freq: Every day | ORAL | Status: DC
  Administered 2015-04-16 – 2015-04-21 (×5): 200 mg via ORAL
  Filled 2015-04-15 (×8): qty 2

## 2015-04-15 MED ORDER — BISACODYL 5 MG PO TBEC
10.0000 mg | DELAYED_RELEASE_TABLET | Freq: Every day | ORAL | Status: DC | PRN
  Administered 2015-04-18: 10 mg via ORAL
  Filled 2015-04-15: qty 2

## 2015-04-15 MED ORDER — BISACODYL 10 MG RE SUPP: 10.0000 mg | Freq: Every day | RECTAL | Status: DC | PRN

## 2015-04-15 MED ORDER — INSULIN ASPART 100 UNIT/ML ~~LOC~~ SOLN: 0.0000 [IU] | Freq: Three times a day (TID) | SUBCUTANEOUS | Status: DC

## 2015-04-15 MED ORDER — POTASSIUM CHLORIDE CRYS ER 20 MEQ PO TBCR
40.0000 meq | EXTENDED_RELEASE_TABLET | Freq: Every day | ORAL | Status: DC
  Administered 2015-04-16: 40 meq via ORAL
  Filled 2015-04-15 (×2): qty 2

## 2015-04-15 MED ORDER — FUROSEMIDE 10 MG/ML IJ SOLN
40.0000 mg | Freq: Once | INTRAMUSCULAR | Status: AC
  Administered 2015-04-15: 40 mg via INTRAVENOUS
  Filled 2015-04-15: qty 4

## 2015-04-15 MED ORDER — SODIUM CHLORIDE 0.9 % IJ SOLN
3.0000 mL | Freq: Two times a day (BID) | INTRAMUSCULAR | Status: DC
  Administered 2015-04-15 – 2015-04-21 (×12): 3 mL via INTRAVENOUS

## 2015-04-15 MED ORDER — POTASSIUM CHLORIDE CRYS ER 20 MEQ PO TBCR
40.0000 meq | EXTENDED_RELEASE_TABLET | Freq: Once | ORAL | Status: AC
  Administered 2015-04-15: 40 meq via ORAL
  Filled 2015-04-15: qty 2

## 2015-04-15 MED ORDER — SODIUM CHLORIDE 0.9 % IV SOLN: 250.0000 mL | INTRAVENOUS | Status: DC | PRN

## 2015-04-15 MED ORDER — FAMOTIDINE 20 MG PO TABS
20.0000 mg | ORAL_TABLET | Freq: Two times a day (BID) | ORAL | Status: DC
  Administered 2015-04-15 – 2015-04-21 (×13): 20 mg via ORAL
  Filled 2015-04-15 (×16): qty 1

## 2015-04-15 MED ORDER — FUROSEMIDE 40 MG PO TABS
40.0000 mg | ORAL_TABLET | Freq: Every day | ORAL | Status: DC
  Administered 2015-04-16 – 2015-04-19 (×4): 40 mg via ORAL
  Filled 2015-04-15 (×5): qty 1

## 2015-04-15 MED ORDER — TRAMADOL HCL 50 MG PO TABS
50.0000 mg | ORAL_TABLET | ORAL | Status: DC | PRN
  Administered 2015-04-18 – 2015-04-19 (×2): 100 mg via ORAL
  Filled 2015-04-15 (×2): qty 2

## 2015-04-15 MED ORDER — OXYCODONE HCL 5 MG PO TABS
5.0000 mg | ORAL_TABLET | ORAL | Status: DC | PRN
  Administered 2015-04-15 – 2015-04-21 (×6): 10 mg via ORAL
  Filled 2015-04-15 (×7): qty 2

## 2015-04-15 MED ORDER — AMIODARONE HCL 200 MG PO TABS
200.0000 mg | ORAL_TABLET | Freq: Every day | ORAL | Status: DC
  Administered 2015-04-15 – 2015-04-21 (×7): 200 mg via ORAL
  Filled 2015-04-15 (×8): qty 1

## 2015-04-15 MED ORDER — ONDANSETRON HCL 4 MG/2ML IJ SOLN: 4.0000 mg | Freq: Four times a day (QID) | INTRAMUSCULAR | Status: DC | PRN

## 2015-04-15 MED ORDER — ACETAMINOPHEN 325 MG PO TABS
650.0000 mg | ORAL_TABLET | Freq: Four times a day (QID) | ORAL | Status: DC | PRN
  Administered 2015-04-16 – 2015-04-22 (×6): 650 mg via ORAL
  Filled 2015-04-15 (×7): qty 2

## 2015-04-15 MED ORDER — ONDANSETRON HCL 4 MG PO TABS
4.0000 mg | ORAL_TABLET | Freq: Four times a day (QID) | ORAL | Status: DC | PRN
  Administered 2015-04-19: 4 mg via ORAL
  Filled 2015-04-15: qty 1

## 2015-04-15 MED ORDER — MOVING RIGHT ALONG BOOK
Freq: Once | Status: AC
  Administered 2015-04-16: 14:00:00
  Filled 2015-04-15: qty 1

## 2015-04-15 MED ORDER — ASPIRIN EC 325 MG PO TBEC
325.0000 mg | DELAYED_RELEASE_TABLET | Freq: Every day | ORAL | Status: DC
  Administered 2015-04-16 – 2015-04-21 (×6): 325 mg via ORAL
  Filled 2015-04-15 (×8): qty 1

## 2015-04-15 MED ORDER — SODIUM CHLORIDE 0.9 % IJ SOLN: 3.0000 mL | INTRAMUSCULAR | Status: DC | PRN

## 2015-04-15 NOTE — Progress Notes (Signed)
2 Days Post-Op Procedure(s) (LRB): CORONARY ARTERY BYPASS GRAFTING (CABG) (N/A) TRANSESOPHAGEAL ECHOCARDIOGRAM (TEE) (N/A) CLIPPING OF ATRIAL APPENDAGE Subjective: No complaints  Walked a little yesterday.  Objective: Vital signs in last 24 hours: Temp:  [97.3 F (36.3 C)-99 F (37.2 C)] 98.4 F (36.9 C) (05/11 0747) Pulse Rate:  [76-103] 88 (05/11 0800) Cardiac Rhythm:  [-] Normal sinus rhythm (05/11 0745) Resp:  [14-31] 18 (05/11 0800) BP: (90-132)/(55-90) 109/62 mmHg (05/11 0800) SpO2:  [94 %-100 %] 94 % (05/11 0800) Arterial Line BP: (78-201)/(75-194) 201/194 mmHg (05/10 1000) Weight:  [153 kg (337 lb 4.9 oz)] 153 kg (337 lb 4.9 oz) (05/11 0630)  Hemodynamic parameters for last 24 hours: PAP: (26-31)/(21-22) 31/21 mmHg  Intake/Output from previous day: 05/10 0701 - 05/11 0700 In: 2171.8 [P.O.:480; I.V.:1441.8; IV Piggyback:250] Out: 1160 [Urine:1130; Chest Tube:30] Intake/Output this shift: Total I/O In: 56.7 [I.V.:56.7] Out: 100 [Urine:100]  General appearance: alert and cooperative Neurologic: intact Heart: regular rate and rhythm, S1, S2 normal, no murmur, click, rub or gallop Lungs: diminished breath sounds bibasilar Extremities: edema mild Wound: dressing dry  Lab Results:  Recent Labs  04/14/15 1545 04/14/15 1552 04/15/15 0400  WBC 9.2  --  9.5  HGB 10.0* 10.5* 10.2*  HCT 30.6* 31.0* 31.1*  PLT 132*  --  144*   BMET:  Recent Labs  04/14/15 0426  04/14/15 1552 04/15/15 0400  NA 135  --  135 134*  K 3.9  --  3.7 4.1  CL 105  --  98* 102  CO2 23  --   --  27  GLUCOSE 126*  --  166* 96  BUN 13  --  16 12  CREATININE 1.03  < > 1.00 0.98  CALCIUM 7.7*  --   --  8.0*  < > = values in this interval not displayed.  PT/INR:  Recent Labs  04/13/15 1655  LABPROT 15.6*  INR 1.23   ABG    Component Value Date/Time   PHART 7.345* 04/13/2015 2302   HCO3 24.1* 04/13/2015 2302   TCO2 21 04/14/2015 1552   ACIDBASEDEF 2.0 04/13/2015 2302   O2SAT 93.0 04/13/2015 2302   CBG (last 3)   Recent Labs  04/14/15 1918 04/14/15 2309 04/15/15 0353  GLUCAP 108* 135* 93    Assessment/Plan: S/P Procedure(s) (LRB): CORONARY ARTERY BYPASS GRAFTING (CABG) (N/A) TRANSESOPHAGEAL ECHOCARDIOGRAM (TEE) (N/A) CLIPPING OF ATRIAL APPENDAGE  He is hemodynamically stable in sinus rhythm.  Resume oral amio and DC drip. Start diuresis DM: glucose under good control on Levemir and SSI. Transfer to 2W and continue mobilization He has history of gout and says colchicine causes diarrhea after 2 doses.   LOS: 10 days    Alleen Borne 04/15/2015

## 2015-04-15 NOTE — Progress Notes (Signed)
Ref: Default, Provider, MD   Subjective:  Feeling better and sitting up. Afebrile.  Objective:  Vital Signs in the last 24 hours: Temp:  [97.3 F (36.3 C)-98.5 F (36.9 C)] 97.4 F (36.3 C) (05/11 1144) Pulse Rate:  [76-92] 86 (05/11 1000) Cardiac Rhythm:  [-] Normal sinus rhythm (05/11 0745) Resp:  [14-31] 21 (05/11 1000) BP: (90-119)/(55-70) 105/65 mmHg (05/11 1000) SpO2:  [94 %-99 %] 94 % (05/11 1000) Weight:  [153 kg (337 lb 4.9 oz)] 153 kg (337 lb 4.9 oz) (05/11 0630)  Physical Exam: BP Readings from Last 1 Encounters:  04/15/15 105/65    Wt Readings from Last 1 Encounters:  04/15/15 153 kg (337 lb 4.9 oz)    Weight change: 3.6 kg (7 lb 15 oz)  HEENT: [place]/AT, Eyes-Hazel, wears glasses, PERL, EOMI, Conjunctiva-Pink, Sclera-Non-icteric Neck: No JVD, No bruit, Trachea midline. Lungs:  Fine crackles, Bilateral. Midline chest wound healing well. Cardiac:  Regular rhythm, normal S1 and S2, no S3. II/VI systolic murmur. Abdomen:  Soft, non-tender. Extremities:  1 + edema present. No cyanosis. No clubbing. CNS: AxOx3, Cranial nerves grossly intact, moves all 4 extremities. Right handed. Skin: Warm and dry.   Intake/Output from previous day: 05/10 0701 - 05/11 0700 In: 2171.8 [P.O.:480; I.V.:1441.8; IV Piggyback:250] Out: 1160 [Urine:1130; Chest Tube:30]    Lab Results: BMET    Component Value Date/Time   NA 134* 04/15/2015 0400   NA 135 04/14/2015 1552   NA 135 04/14/2015 0426   K 4.1 04/15/2015 0400   K 3.7 04/14/2015 1552   K 3.9 04/14/2015 0426   CL 102 04/15/2015 0400   CL 98* 04/14/2015 1552   CL 105 04/14/2015 0426   CO2 27 04/15/2015 0400   CO2 23 04/14/2015 0426   CO2 24 04/13/2015 0329   GLUCOSE 96 04/15/2015 0400   GLUCOSE 166* 04/14/2015 1552   GLUCOSE 126* 04/14/2015 0426   GLUCOSE 99 09/12/2006 1309   BUN 12 04/15/2015 0400   BUN 16 04/14/2015 1552   BUN 13 04/14/2015 0426   CREATININE 0.98 04/15/2015 0400   CREATININE 1.00 04/14/2015 1552   CREATININE 1.07 04/14/2015 1545   CALCIUM 8.0* 04/15/2015 0400   CALCIUM 7.7* 04/14/2015 0426   CALCIUM 9.2 04/13/2015 0329   GFRNONAA >60 04/15/2015 0400   GFRNONAA >60 04/14/2015 1545   GFRNONAA >60 04/14/2015 0426   GFRAA >60 04/15/2015 0400   GFRAA >60 04/14/2015 1545   GFRAA >60 04/14/2015 0426   CBC    Component Value Date/Time   WBC 9.5 04/15/2015 0400   RBC 3.50* 04/15/2015 0400   HGB 10.2* 04/15/2015 0400   HCT 31.1* 04/15/2015 0400   PLT 144* 04/15/2015 0400   MCV 88.9 04/15/2015 0400   MCH 29.1 04/15/2015 0400   MCHC 32.8 04/15/2015 0400   RDW 14.9 04/15/2015 0400   LYMPHSABS 1.7 04/05/2015 1900   MONOABS 0.6 04/05/2015 1900   EOSABS 0.2 04/05/2015 1900   BASOSABS 0.0 04/05/2015 1900   HEPATIC Function Panel  Recent Labs  01/13/15 0120 04/05/15 1900 04/11/15 0608  PROT 5.9* 6.3* 6.5   HEMOGLOBIN A1C No components found for: HGA1C,  MPG CARDIAC ENZYMES Lab Results  Component Value Date   CKTOTAL 121 04/15/2015   CKMB 5.5* 04/15/2015   TROPONINI 0.39* 04/15/2015   TROPONINI 0.60* 04/15/2015   TROPONINI 0.73* 04/14/2015   BNP No results for input(s): PROBNP in the last 8760 hours. TSH  Recent Labs  01/14/15 1140 04/05/15 1900  TSH 3.727 5.204*  CHOLESTEROL No results for input(s): CHOL in the last 8760 hours.  Scheduled Meds: . amiodarone  200 mg Oral Daily  . aspirin EC  325 mg Oral Daily  . carvedilol  6.25 mg Oral BID WC  . docusate sodium  200 mg Oral Daily  . enoxaparin (LOVENOX) injection  40 mg Subcutaneous QHS  . famotidine  20 mg Oral BID  . [START ON 04/16/2015] furosemide  40 mg Oral Daily  . insulin aspart  0-24 Units Subcutaneous TID AC & HS  . insulin detemir  30 Units Subcutaneous Daily  . moving right along book   Does not apply Once  . [START ON 04/16/2015] potassium chloride  40 mEq Oral Daily  . pravastatin  40 mg Oral q1800  . sodium chloride  3 mL Intravenous Q12H   Continuous Infusions:  PRN Meds:.sodium  chloride, acetaminophen, bisacodyl **OR** bisacodyl, ketorolac, ondansetron **OR** ondansetron (ZOFRAN) IV, oxyCODONE, sodium chloride, traMADol  Assessment/Plan: Acute on chronic left heart systolic failure. Severe 3 Vessel coronary artery disease 4 vessel CABG-post op day 2 Atrial fibrillation, recurrent. Morbid obesity Hyperlipidemia Hypertension Hypokalemia Prolonged QTc. GERD  Continue diuresis and increase activity.     LOS: 10 days    Orpah Cobb  MD  04/15/2015, 7:46 PM

## 2015-04-15 NOTE — Progress Notes (Signed)
CARDIAC REHAB PHASE I   PRE:  Rate/Rhythm: 92 SR  BP:  Supine:   Sitting: 116/61  Standing:    SaO2: 97% 2L  MODE:  Ambulation: 70 ft   POST:  Rate/Rhythm: 101  BP:  Supine:   Sitting: 155/89  Standing:    SaO2: 94% 2L 1410-1445 Pt c/o gout in right foot that is better with shoes on. Very DOE. Has hard time trying to purse lip breathe and more to hyperventilation. Had to get pt to look at [place] to concentrate on breathing. He thinks hyperventilation gets more air in but when he slowed breathing, sats good on 2L. To recliner and encouraged IS. Needed to get help to stand. Keep as asst x 2. Walked 70 ft on 2L with rolling walker and asst x2.   Luetta Nutting, RN BSN  04/15/2015 2:41 PM

## 2015-04-15 NOTE — Care Management Note (Signed)
Case Management Note  Patient Details  Name: Charles Mckinney MRN: [id number] Date of Birth: 1951-02-24  Subjective/Objective:                    Action/Plan:   Expected Discharge Date:                 Expected Discharge Plan:  Home w Home Health Services  In-House Referral:  NA  Discharge planning Services  CM Consult  Post Acute Care Choice:    Choice offered to:     DME Arranged:    DME Agency:     HH Arranged:    HH Agency:     Status of Service:  In process, will continue to follow  Medicare Important Message Given:  No Date Medicare IM Given:    Medicare IM give by:    Date Additional Medicare IM Given:    Additional Medicare Important Message give by:     If discussed at Long Length of Stay Meetings, dates discussed:    Additional Comments:  Magdalene River, RN 04/15/2015, 10:19 AM

## 2015-04-16 LAB — BASIC METABOLIC PANEL
ANION GAP: 7 (ref 5–15)
BUN: 14 mg/dL (ref 6–20)
CALCIUM: 8.3 mg/dL — AB (ref 8.9–10.3)
CHLORIDE: 102 mmol/L (ref 101–111)
CO2: 27 mmol/L (ref 22–32)
CREATININE: 1 mg/dL (ref 0.61–1.24)
Glucose, Bld: 96 mg/dL (ref 65–99)
Potassium: 4.5 mmol/L (ref 3.5–5.1)
Sodium: 136 mmol/L (ref 135–145)

## 2015-04-16 LAB — CBC
HEMATOCRIT: 31.1 % — AB (ref 39.0–52.0)
Hemoglobin: 10.1 g/dL — ABNORMAL LOW (ref 13.0–17.0)
MCH: 28.9 pg (ref 26.0–34.0)
MCHC: 32.5 g/dL (ref 30.0–36.0)
MCV: 88.9 fL (ref 78.0–100.0)
PLATELETS: 170 10*3/uL (ref 150–400)
RBC: 3.5 MIL/uL — ABNORMAL LOW (ref 4.22–5.81)
RDW: 14.9 % (ref 11.5–15.5)
WBC: 8.6 10*3/uL (ref 4.0–10.5)

## 2015-04-16 LAB — GLUCOSE, CAPILLARY
GLUCOSE-CAPILLARY: 128 mg/dL — AB (ref 65–99)
Glucose-Capillary: 107 mg/dL — ABNORMAL HIGH (ref 65–99)
Glucose-Capillary: 102 mg/dL — ABNORMAL HIGH (ref 65–99)

## 2015-04-16 MED ORDER — LACTULOSE 10 GM/15ML PO SOLN
20.0000 g | Freq: Once | ORAL | Status: AC
  Administered 2015-04-16: 20 g via ORAL
  Filled 2015-04-16: qty 30

## 2015-04-16 MED ORDER — FUROSEMIDE 40 MG PO TABS
40.0000 mg | ORAL_TABLET | Freq: Once | ORAL | Status: AC
  Administered 2015-04-16: 40 mg via ORAL
  Filled 2015-04-16: qty 1

## 2015-04-16 NOTE — Progress Notes (Signed)
Ref: Default, Provider, MD   Subjective:  No new complaints. Some diuresis yesterday. Afebrile.  Objective:  Vital Signs in the last 24 hours: Temp:  [97.4 F (36.3 C)-98.6 F (37 C)] 97.4 F (36.3 C) (05/12 1357) Pulse Rate:  [89-93] 93 (05/12 1357) Cardiac Rhythm:  [-] Normal sinus rhythm (05/12 0730) Resp:  [18-20] 18 (05/12 1357) BP: (98-124)/(60-65) 124/65 mmHg (05/12 1450) SpO2:  [97 %-98 %] 97 % (05/12 1357) Weight:  [155.085 kg (341 lb 14.4 oz)] 155.085 kg (341 lb 14.4 oz) (05/12 0411)  Physical Exam: BP Readings from Last 1 Encounters:  04/16/15 124/65    Wt Readings from Last 1 Encounters:  04/16/15 155.085 kg (341 lb 14.4 oz)    Weight change: 2.085 kg (4 lb 9.5 oz)  HEENT: [place]/AT, Eyes-Hazel, PERL, EOMI, Conjunctiva-Pink, Sclera-Non-icteric. Wears glasses. Neck: No JVD, No bruit, Trachea midline. Lungs:  Clearing, Bilateral. Midline scar of surgery. Cardiac:  Regular rhythm, normal S1 and S2, no S3. II/VI systolic murmur. Abdomen:  Soft, non-tender. Extremities:  1 + edema present. No cyanosis. No clubbing. CNS: AxOx3, Cranial nerves grossly intact, moves all 4 extremities. Right handed. Skin: Warm and dry.   Intake/Output from previous day: 05/11 0701 - 05/12 0700 In: 690.1 [P.O.:600; I.V.:90.1] Out: 1850 [Urine:1850]    Lab Results: BMET    Component Value Date/Time   NA 136 04/16/2015 0515   NA 134* 04/15/2015 0400   NA 135 04/14/2015 1552   K 4.5 04/16/2015 0515   K 4.1 04/15/2015 0400   K 3.7 04/14/2015 1552   CL 102 04/16/2015 0515   CL 102 04/15/2015 0400   CL 98* 04/14/2015 1552   CO2 27 04/16/2015 0515   CO2 27 04/15/2015 0400   CO2 23 04/14/2015 0426   GLUCOSE 96 04/16/2015 0515   GLUCOSE 96 04/15/2015 0400   GLUCOSE 166* 04/14/2015 1552   GLUCOSE 99 09/12/2006 1309   BUN 14 04/16/2015 0515   BUN 12 04/15/2015 0400   BUN 16 04/14/2015 1552   CREATININE 1.00 04/16/2015 0515   CREATININE 0.98 04/15/2015 0400   CREATININE 1.00  04/14/2015 1552   CALCIUM 8.3* 04/16/2015 0515   CALCIUM 8.0* 04/15/2015 0400   CALCIUM 7.7* 04/14/2015 0426   GFRNONAA >60 04/16/2015 0515   GFRNONAA >60 04/15/2015 0400   GFRNONAA >60 04/14/2015 1545   GFRAA >60 04/16/2015 0515   GFRAA >60 04/15/2015 0400   GFRAA >60 04/14/2015 1545   CBC    Component Value Date/Time   WBC 8.6 04/16/2015 0515   RBC 3.50* 04/16/2015 0515   HGB 10.1* 04/16/2015 0515   HCT 31.1* 04/16/2015 0515   PLT 170 04/16/2015 0515   MCV 88.9 04/16/2015 0515   MCH 28.9 04/16/2015 0515   MCHC 32.5 04/16/2015 0515   RDW 14.9 04/16/2015 0515   LYMPHSABS 1.7 04/05/2015 1900   MONOABS 0.6 04/05/2015 1900   EOSABS 0.2 04/05/2015 1900   BASOSABS 0.0 04/05/2015 1900   HEPATIC Function Panel  Recent Labs  01/13/15 0120 04/05/15 1900 04/11/15 0608  PROT 5.9* 6.3* 6.5   HEMOGLOBIN A1C No components found for: HGA1C,  MPG CARDIAC ENZYMES Lab Results  Component Value Date   CKTOTAL 121 04/15/2015   CKMB 5.5* 04/15/2015   TROPONINI 0.39* 04/15/2015   TROPONINI 0.60* 04/15/2015   TROPONINI 0.73* 04/14/2015   BNP No results for input(s): PROBNP in the last 8760 hours. TSH  Recent Labs  01/14/15 1140 04/05/15 1900  TSH 3.727 5.204*   CHOLESTEROL No results  for input(s): CHOL in the last 8760 hours.  Scheduled Meds: . amiodarone  200 mg Oral Daily  . aspirin EC  325 mg Oral Daily  . carvedilol  6.25 mg Oral BID WC  . docusate sodium  200 mg Oral Daily  . enoxaparin (LOVENOX) injection  40 mg Subcutaneous QHS  . famotidine  20 mg Oral BID  . furosemide  40 mg Oral Daily  . potassium chloride  40 mEq Oral Daily  . pravastatin  40 mg Oral q1800  . sodium chloride  3 mL Intravenous Q12H   Continuous Infusions:  PRN Meds:.sodium chloride, acetaminophen, bisacodyl **OR** bisacodyl, ketorolac, ondansetron **OR** ondansetron (ZOFRAN) IV, oxyCODONE, sodium chloride, traMADol  Assessment/Plan: Acute on chronic left heart systolic failure. Severe  3 Vessel coronary artery disease 4 vessel CABG-post op day 3 Atrial fibrillation, recurrent. Morbid obesity Hyperlipidemia Hypertension Hypokalemia Prolonged QTc. GERD  Agree with SNF on discharge.     LOS: 11 days    Orpah Cobb  MD  04/16/2015, 3:10 PM

## 2015-04-16 NOTE — Progress Notes (Signed)
Pt has some confusion about discharge plan for SNF.  I asked pt if he was getting to be discharged sometime this weekend, and he said "i can't go home by myself tomorrow".  Even though I told him I did not saying he was going tomorrow I overheard him telling his relatives on the phone that we are pushing him out tomorrow and he will be home alone.  I reiterated to the pt that the current plan is most likely SNF for rehab and they have not set a date yet.  I let him know that I would leave a message for social work to discuss this with him tomorrow.

## 2015-04-16 NOTE — Progress Notes (Signed)
PT Cancellation Note  Patient Details Name: Charles Mckinney MRN: [id number] DOB: 1951/08/31   Cancelled Treatment:    Reason Eval/Treat Not Completed: Other (comment) (OT just checked and patient  just received dinner. PT will return in AM.)   [name] 04/16/2015, 4:33 PM Blanchard Kelch PT [phone]

## 2015-04-16 NOTE — Progress Notes (Signed)
CARDIAC REHAB PHASE I   PRE:  Rate/Rhythm: 95 SR  BP:  Supine:  Sitting: 127/72  Standing:    SaO2: 94 2 L  MODE:  Ambulation: 100 ft   POST:  Rate/Rhythem: 106 ST  BP:  Supine:   Sitting: 129/72   Standing:    SaO2: 97 2 L  1045-1117  Pt. Walked 100 feet with walker, O2 at 2L and assist x 2. Pt. c/o SOB when walking. Encouraged purse lip breathing. Pt needs much encouragement to increase distance. Pt stated that we was only going to walk the same distance as yesterday. We did encourage to go a little further with many standing rest break. Returned to [employer]. Pt needs a lot of motivation and positive feedback. Used incentive spirometer 700 mL.  Paulita Fujita, MS, ACSM-RCEP

## 2015-04-16 NOTE — Progress Notes (Addendum)
      [address]       [place] [address]             [phone]        3 Days Post-Op Procedure(s) (LRB): CORONARY ARTERY BYPASS GRAFTING (CABG) (N/A) TRANSESOPHAGEAL ECHOCARDIOGRAM (TEE) (N/A) CLIPPING OF ATRIAL APPENDAGE  Subjective: Patient not feeling well this am. Has intermittent sweating, is short of breath, and has incisional pain.  Objective: Vital signs in last 24 hours: Temp:  [97.4 F (36.3 C)-98.6 F (37 C)] 98.6 F (37 C) (05/12 0411) Pulse Rate:  [81-92] 92 (05/12 0411) Cardiac Rhythm:  [-] Normal sinus rhythm (05/12 0000) Resp:  [18-21] 18 (05/12 0411) BP: (98-110)/(62-65) 110/64 mmHg (05/12 0411) SpO2:  [94 %-98 %] 97 % (05/12 0411) Weight:  [341 lb 14.4 oz (155.085 kg)] 341 lb 14.4 oz (155.085 kg) (05/12 0411)  Pre op weight 150 kg Current Weight  04/16/15 341 lb 14.4 oz (155.085 kg)      Intake/Output from previous day: 05/11 0701 - 05/12 0700 In: 690.1 [P.O.:600; I.V.:90.1] Out: 1850 [Urine:1850]   Physical Exam:  Cardiovascular: RRR Pulmonary: Diminished at bases bilaterally; no rales, wheezes, or rhonchi. Abdomen: Soft, non tender, bowel sounds present. Extremities: SCDs in place Wounds: Clean and dry.  No erythema or signs of infection.  Lab Results: CBC: Recent Labs  04/15/15 0400 04/16/15 0515  WBC 9.5 8.6  HGB 10.2* 10.1*  HCT 31.1* 31.1*  PLT 144* 170   BMET:  Recent Labs  04/15/15 0400 04/16/15 0515  NA 134* 136  K 4.1 4.5  CL 102 102  CO2 27 27  GLUCOSE 96 96  BUN 12 14  CREATININE 0.98 1.00  CALCIUM 8.0* 8.3*    PT/INR:  Lab Results  Component Value Date   INR 1.23 04/13/2015   INR 1.06 04/09/2015   INR 1.25 04/05/2015   ABG:  INR: Will add last result for INR, ABG once components are confirmed Will add last 4 CBG results once components are confirmed  Assessment/Plan:  1. CV - SR in the 90's. On Amiodarone 200 mg daily, Coreg 6.25 mg bid. 2.  Pulmonary - On 2 liters of oxygen  via [place]. Wean as tolerates. Cardiomegaly, improvement in pulmonary congestion, persistent bibasilar atelectasis, and left pleural effusion. Encourage incentive spirometer 3. Volume Overload - Will give Lasix 40 mg bid 4.  Acute blood loss anemia - H and H stable at 10.1 and 31.1 5. Remove EPW 6. LOC constipation 7. CBGs 112/97/107. On Insulin. Pre op HGA1C 6.2. Stop scheduled Insulin. Will need follow up with medical doctor at discharge. 8. When ready for discharge, will need SNF. Will order PT/OT consult.  ZIMMERMAN,DONIELLE MPA-C 04/16/2015,7:32 AM   Chart reviewed, patient examined, agree with above. I think he is doing well overall. He complains about everything but is making progress.

## 2015-04-16 NOTE — Progress Notes (Signed)
OT Cancellation Note  Patient Details Name: Charles Mckinney MRN: [id number] DOB: 07-Nov-1951   Cancelled Treatment:    Reason Eval/Treat Not Completed: Other (comment). Attempted to see Pt however he had just received his dinner and requested OT to return. OT unable to return today with +2 assist and will re-attempt evaluation tomorrow.   Nena Jordan M 04/16/2015, 5:56 PM

## 2015-04-17 LAB — GLUCOSE, CAPILLARY
GLUCOSE-CAPILLARY: 92 mg/dL (ref 65–99)
Glucose-Capillary: 102 mg/dL — ABNORMAL HIGH (ref 65–99)
Glucose-Capillary: 113 mg/dL — ABNORMAL HIGH (ref 65–99)
Glucose-Capillary: 104 mg/dL — ABNORMAL HIGH (ref 65–99)

## 2015-04-17 LAB — CK TOTAL AND CKMB (NOT AT ARMC)
CK, MB: 1.6 ng/mL (ref 0.5–5.0)
Relative Index: 0.4 (ref 0.0–2.5)
Total CK: 417 U/L — ABNORMAL HIGH (ref 49–397)

## 2015-04-17 LAB — TROPONIN I: Troponin I: 0.21 ng/mL — ABNORMAL HIGH (ref <0.031)

## 2015-04-17 MED ORDER — FUROSEMIDE 40 MG PO TABS
40.0000 mg | ORAL_TABLET | Freq: Once | ORAL | Status: AC
  Administered 2015-04-17: 40 mg via ORAL
  Filled 2015-04-17: qty 1

## 2015-04-17 MED ORDER — POTASSIUM CHLORIDE CRYS ER 20 MEQ PO TBCR: 20.0000 meq | EXTENDED_RELEASE_TABLET | Freq: Once | ORAL | Status: DC

## 2015-04-17 MED ORDER — LISINOPRIL 2.5 MG PO TABS
2.5000 mg | ORAL_TABLET | Freq: Every day | ORAL | Status: DC
Start: 2015-04-18 — End: 2015-04-22
  Administered 2015-04-18 – 2015-04-21 (×4): 2.5 mg via ORAL
  Filled 2015-04-17 (×5): qty 1

## 2015-04-17 MED ORDER — POTASSIUM CHLORIDE CRYS ER 20 MEQ PO TBCR
20.0000 meq | EXTENDED_RELEASE_TABLET | Freq: Every day | ORAL | Status: AC
  Administered 2015-04-17 – 2015-04-18 (×2): 20 meq via ORAL
  Filled 2015-04-17: qty 1

## 2015-04-17 NOTE — Progress Notes (Signed)
Utilization review completed.  

## 2015-04-17 NOTE — Progress Notes (Signed)
CARDIAC REHAB PHASE I   PRE:  Rate/Rhythm: 97 SR  BP:  Supine:   Sitting: 119/74  Standing:    SaO2: 96% 2L  MODE:  Ambulation: 190 ft   POST:  Rate/Rhythm: 119 ST  BP:  Supine:   Sitting: 130/70  Standing:    SaO2: 96% 2L 5003-7048 Pt does not want to walk without oxygen and stated he would not be able to go far if he does not have it. Got pt to walk 190 ft on 2L and rolling walker with much encouragement to go farther. Took many standing rest breaks. Encouraged slow deep breaths during walk. To recliner after walk.  Will check RA oxygen when walking closer to discharge.   Luetta Nutting, RN BSN  04/17/2015 8:32 AM

## 2015-04-17 NOTE — Care Management Note (Signed)
Case Management Note  Patient Details  Name: Charles Mckinney MRN: [id number] Date of Birth: [birth year]  Subjective/Objective:    Pt s/p CABG          Action/Plan: PTA pt lived at home alone- has son but not 24/7 assistance- PT/OT recommending SNF-    Expected Discharge Date:  04/20/15               Expected Discharge Plan:  Skilled Nursing Facility  In-House Referral:  Clinical Social Work  Discharge planning Services  CM Consult  Post Acute Care Choice:    Choice offered to:     DME Arranged:    DME Agency:     HH Arranged:    HH Agency:     Status of Service:  In process, will continue to follow  Medicare Important Message Given:  No Date Medicare IM Given:    Medicare IM give by:    Date Additional Medicare IM Given:    Additional Medicare Important Message give by:     If discussed at Long Length of Stay Meetings, dates discussed:    Additional Comments:  Spoke with April at the [facility]- pt is non service connected- but is followed by the [facility]- Dr. Raphael Gibney- per April any STSNF or [facility] needs should go through the [facility]- SW there is [facility] [phone] ext. [phone]- she would also be the one to speak with if plan changed to [facility]- CSW notified of plan for STSNF- she will contact [place] for SNF packet- [facility] request at 1 pm (M-F) next review will be on Monday 5/16.   Darrold Span, RN 04/17/2015, 3:19 PM

## 2015-04-17 NOTE — Evaluation (Signed)
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 Pt presented to the ED with progressive SOB. 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  Clinical Impression  Pt admitted with above diagnosis. Pt currently with functional limitations due to the deficits listed below (see PT Problem List). At the time of PT eval pt was able to perform transfers and ambulation with close guard for safety. Pt very concerned about his breathing, and although was SOB during gait training, sats remained 92-96% on RA throughout session. Pt will benefit from skilled PT to increase their independence and safety with mobility to allow discharge to the venue listed below.       Follow Up Recommendations SNF;Supervision/Assistance - 24 hour    Equipment Recommendations  Other (comment) (TBD by next venue of care)    Recommendations for Other Services       Precautions / Restrictions Precautions Precautions: Fall;Sternal Precaution Comments: Pt can state precautions however continues to need cueing for practice Restrictions Weight Bearing Restrictions: No      Mobility  Bed Mobility               General bed mobility comments: Pt sitting up in the recliner upon PT arrival.   Transfers Overall transfer level: Needs assistance Equipment used: Rolling walker (2 wheeled) Transfers: Sit to/from Stand Sit to Stand: Min guard         General transfer comment: Pt was able to power-up to full standing position without physical assistance. Pt required cueing for proper hand placement and pillow use to maintain sternal precautions.   Ambulation/Gait Ambulation/Gait assistance: Min guard Ambulation Distance (Feet): 100 Feet Assistive device: Rolling walker (2 wheeled) Gait Pattern/deviations:  Step-through pattern;Decreased stride length;Trunk flexed Gait velocity: Decreased Gait velocity interpretation: Below normal speed for age/gender General Gait Details: Pt was able to ambulate in hall on RA. Pt complaining of signficant SOB however O2 sats ranging 92-96% throughout gait training. He was cued for pursed-lip breathing techniques however pt states he is not able to breathe through his nose and did not make corrective changes. Encouraged increased distance however pt declined and asked to return to the room.   Stairs            Wheelchair Mobility    Modified Rankin (Stroke Patients Only)       Balance Overall balance assessment: Needs assistance Sitting-balance support: Feet supported;No upper extremity supported Sitting balance-Leahy Scale: Good     Standing balance support: Bilateral upper extremity supported Standing balance-Leahy Scale: Poor Standing balance comment: Requires UE support for standing balance at this time.                              Pertinent Vitals/Pain Pain Assessment: Faces Faces Pain Scale: Hurts a little bit Pain Location: Chest Pain Descriptors / Indicators: Operative site guarding Pain Intervention(s): Limited activity within patient's tolerance;Monitored during session;Repositioned    Home Living Family/patient expects to be discharged to:: Skilled nursing facility Living Arrangements: Alone                    Prior Function Level of Independence: Independent with assistive device(s)         Comments: Used a cane when going out to the store. No  longer working. Still driving.     Hand Dominance   Dominant Hand: Right    Extremity/Trunk Assessment   Upper Extremity Assessment: Defer to OT evaluation           Lower Extremity Assessment: Overall WFL for tasks assessed      Cervical / Trunk Assessment: Normal  Communication   Communication: No difficulties  Cognition Arousal/Alertness:  Awake/alert Behavior During Therapy: Anxious Overall Cognitive Status: Within Functional Limits for tasks assessed                      General Comments      Exercises        Assessment/Plan    PT Assessment Patient needs continued PT services  PT Diagnosis Difficulty walking;Generalized weakness   PT Problem List Decreased strength;Decreased range of motion;Decreased activity tolerance;Decreased balance;Decreased mobility;Decreased knowledge of use of DME;Decreased safety awareness;Decreased knowledge of precautions;Cardiopulmonary status limiting activity;Pain  PT Treatment Interventions DME instruction;Gait training;Stair training;Functional mobility training;Therapeutic activities;Therapeutic exercise;Neuromuscular re-education;Patient/family education   PT Goals (Current goals can be found in the Care Plan section) Acute Rehab PT Goals Patient Stated Goal: To get rehab after d/c PT Goal Formulation: With patient Time For Goal Achievement: 05/01/15 Potential to Achieve Goals: Good    Frequency Min 3X/week   Barriers to discharge Decreased caregiver support Pt lives alone    Co-evaluation               End of Session Equipment Utilized During Treatment: Gait belt;Oxygen Activity Tolerance: Patient limited by fatigue (Pt reported SOB) Patient left: in chair;with call bell/phone within reach Nurse Communication: Mobility status         Time: 2130-8657 PT Time Calculation (min) (ACUTE ONLY): 24 min   Charges:   PT Evaluation $Initial PT Evaluation Tier I: 1 Procedure PT Treatments $Gait Training: 8-22 mins   PT G Codes:        Conni Slipper 05/16/15, 9:39 AM  Conni Slipper, PT, DPT Acute Rehabilitation Services Pager: [phone]

## 2015-04-17 NOTE — Evaluation (Signed)
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 Pt presented to the ED with progressive SOB. 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   Clinical Impression   Pt with decline in function and safety with ADLs and ADL mobility with decreased strength, balance and endurance. Pt fatigues very easily, anxious about becoming SOB    Follow Up Recommendations  SNF;Supervision/Assistance - 24 hour    Equipment Recommendations  None recommended by OT;Other (comment) (TBD at next venue of care)    Recommendations for Other Services       Precautions / Restrictions Precautions Precautions: Fall;Sternal Precaution Comments: Pt able to reall sternal precautions, however requires cues to maintain Restrictions Weight Bearing Restrictions: No      Mobility Bed Mobility               General bed mobility comments: Pt sitting up in the recliner upon arrival.   Transfers Overall transfer level: Needs assistance Equipment used: Rolling walker (2 wheeled) Transfers: Sit to/from [employer] Sit to Stand: Min guard Stand pivot transfers: Min guard       General transfer comment: Pt reluctant to get out of chair with OT and required enocuragement. Pt was able to power-up to full standing position without physical assistance. Pt required cueing for proper hand placement and pillow use to maintain sternal precautions.     Balance   Sitting-balance support: Feet supported;No upper extremity supported Sitting balance-Leahy Scale: Good     Standing balance support: Bilateral upper extremity supported;During functional activity Standing balance-Leahy Scale: Poor                              ADL Overall ADL's : Needs assistance/impaired      Grooming: Wash/dry hands;Wash/dry face;Sitting;Supervision/safety;Set up   Upper Body Bathing: Supervision/ safety;Set up;Sitting   Lower Body Bathing: Maximal assistance;Sitting/lateral leans   Upper Body Dressing : Supervision/safety;Set up;Sitting   Lower Body Dressing: Total assistance   Toilet Transfer: Min guard;Stand-pivot;RW   Toileting- Clothing Manipulation and Hygiene: Moderate assistance       Functional mobility during ADLs: Min guard       Vision  no change from baseline   Perception Perception Perception Tested?: No   Praxis Praxis Praxis tested?: Not tested    Pertinent Vitals/Pain Pain Assessment: 0-10 Pain Score: 5  Pain Location: chest Pain Descriptors / Indicators: Operative site guarding Pain Intervention(s): Limited activity within patient's tolerance;Monitored during session;Repositioned;Relaxation     Hand Dominance Right   Extremity/Trunk Assessment Upper Extremity Assessment Upper Extremity Assessment: Generalized weakness   Lower Extremity Assessment Lower Extremity Assessment: Defer to PT evaluation   Cervical / Trunk Assessment Cervical / Trunk Assessment: Normal   Communication Communication Communication: No difficulties   Cognition Arousal/Alertness: Awake/alert Behavior During Therapy: WFL for tasks assessed/performed Overall Cognitive Status: Within Functional Limits for tasks assessed                     General Comments   pt pleasant and cooperative                 Home Living Family/patient expects to be discharged to:: Skilled nursing facility Living Arrangements: Alone  Prior Functioning/Environment Level of Independence: Independent with assistive device(s)        Comments: Used a cane when going out to the store. No longer working. Still driving. Increased time to complete ADLs and home mgt tasks    OT Diagnosis: Generalized  weakness;Acute pain   OT Problem List: Decreased strength;Decreased knowledge of use of DME or AE;Impaired balance (sitting and/or standing);Pain;Decreased activity tolerance;Cardiopulmonary status limiting activity;Obesity   OT Treatment/Interventions:      OT Goals(Current goals can be found in the care plan section) Acute Rehab OT Goals Patient Stated Goal: go to therapy after leaving hospital OT Goal Formulation: With patient Time For Goal Achievement: 04/24/15 Potential to Achieve Goals: Good ADL Goals Pt Will Perform Grooming: with min guard assist;with supervision;standing Pt Will Perform Lower Body Bathing: with mod assist;sitting/lateral leans;sit to/from stand Pt Will Perform Lower Body Dressing: with max assist;with mod assist;sitting/lateral leans;sit to/from stand;with adaptive equipment Pt Will Transfer to Toilet: with supervision;ambulating;regular height toilet (3 in 1 over toilet) Additional ADL Goal #1: Pt will complete sit - stand maintaining sternal precautions without cues in prep for ADL mobility transitions  OT Frequency:     Barriers to D/C:  lives alone, planning for short term SNF                        End of Session Equipment Utilized During Treatment: Rolling walker;Oxygen  Activity Tolerance: Patient limited by fatigue Patient left: in chair   Time: 6546-5035 OT Time Calculation (min): 23 min Charges:  OT General Charges $OT Visit: 1 Procedure OT Evaluation $Initial OT Evaluation Tier I: 1 Procedure OT Treatments $Therapeutic Activity: 8-22 mins G-Codes:    Galen Manila 04/17/2015, 1:03 PM

## 2015-04-17 NOTE — Progress Notes (Addendum)
      [address]       [place] [address]             [phone]        4 Days Post-Op Procedure(s) (LRB): CORONARY ARTERY BYPASS GRAFTING (CABG) (N/A) TRANSESOPHAGEAL ECHOCARDIOGRAM (TEE) (N/A) CLIPPING OF ATRIAL APPENDAGE  Subjective: Patient had a bowel movement. Has some incisional pain this am.  Objective: Vital signs in last 24 hours: Temp:  [97.4 F (36.3 C)-98.9 F (37.2 C)] 98.9 F (37.2 C) (05/13 0432) Pulse Rate:  [85-93] 93 (05/13 0432) Cardiac Rhythm:  [-] Normal sinus rhythm (05/12 2119) Resp:  [18-20] 20 (05/13 0432) BP: (110-124)/(58-67) 114/67 mmHg (05/13 0432) SpO2:  [95 %-97 %] 96 % (05/13 0432) Weight:  [341 lb 14.9 oz (155.1 kg)] 341 lb 14.9 oz (155.1 kg) (05/13 0653)  Pre op weight 150 kg Current Weight  04/17/15 341 lb 14.9 oz (155.1 kg)      Intake/Output from previous day: 05/12 0701 - 05/13 0700 In: 1080 [P.O.:1080] Out: 2701 [Urine:2700; Stool:1]   Physical Exam:  Cardiovascular: RRR Pulmonary: Diminished at bases bilaterally; no rales, wheezes, or rhonchi. Abdomen: Soft, non tender, bowel sounds present. Extremities: Bilateral lower extremity swelling. Wounds: Clean and dry.  No erythema or signs of infection.  Lab Results: CBC:  Recent Labs  04/15/15 0400 04/16/15 0515  WBC 9.5 8.6  HGB 10.2* 10.1*  HCT 31.1* 31.1*  PLT 144* 170   BMET:   Recent Labs  04/15/15 0400 04/16/15 0515  NA 134* 136  K 4.1 4.5  CL 102 102  CO2 27 27  GLUCOSE 96 96  BUN 12 14  CREATININE 0.98 1.00  CALCIUM 8.0* 8.3*    PT/INR:  Lab Results  Component Value Date   INR 1.23 04/13/2015   INR 1.06 04/09/2015   INR 1.25 04/05/2015   ABG:  INR: Will add last result for INR, ABG once components are confirmed Will add last 4 CBG results once components are confirmed  Assessment/Plan:  1. CV - SR in the 90's. On Amiodarone 200 mg daily, Coreg 6.25 mg bid. 2.  Pulmonary - On 2 liters of oxygen via [place]. Wean as  tolerates. Cardiomegaly, improvement in pulmonary congestion, persistent bibasilar atelectasis, and left pleural effusion. Encourage incentive spirometer 3. Volume Overload - Will give Lasix 40 mg bid as had good diuresis yesterday 4.  Acute blood loss anemia - H and H stable at 10.1 and 31.1 5. CBGs 128/102/102. On Insulin. Pre op HGA1C 6.2. Stop scheduled Insulin. Will need follow up with medical doctor at discharge. 6. When ready for discharge, will need SNF. Probably ready Monday   ZIMMERMAN,DONIELLE MPA-C 04/17/2015,7:56 AM    Chart reviewed, patient examined, agree with above. Plan to resume lisinopril, Xarelto, baby ASA at discharge.

## 2015-04-17 NOTE — Clinical Social Work Note (Signed)
Clinical Social Work Assessment  Patient Details  Name: Charles Mckinney MRN: [id number] Date of Birth: 03/18/1951  Date of referral:  04/17/15               Reason for consult:  Facility Placement                Permission sought to share information with:  Facility Medical sales representative, Other ([facility] coordinator) Permission granted to share information::  Yes, Verbal Permission Granted  Name::     Engineering geologist  Agency::     Relationship::  [facility] coordinator  Contact Information:  [phone] ext [phone] pager# [phone] fax# [phone]  Housing/Transportation Living arrangements for the past 2 months:  Single Family Home Source of Information:  Patient Patient Interpreter Needed:  None Criminal Activity/Legal Involvement Pertinent to Current Situation/Hospitalization:  No - Comment as needed Significant Relationships:  Adult Children Lives with:  Self Do you feel safe going back to the place where you live?  No Need for family participation in patient care:  No (Coment)  Care giving concerns:  Patient lives alone- has son and exwife who are unable to provide support   Office manager / plan:  CSW spoke to patient about PT recommendation for SNF  Employment status:  Retired Health and safety inspector:  [employer] PT Recommendations:  Skilled Nursing Facility Information / Referral to community resources:  Skilled Nursing Facility  Patient/Family's Response to care:  Patient is agreeable to SNF and understands that it is not safe for him to go home alone since he can not even get out of bed on his own.  Patient/Family's Understanding of and Emotional Response to Diagnosis, Current Treatment, and Prognosis:  Good understanding of treatment plan and prognosis  Emotional Assessment Appearance:  Appears older than stated age Attitude/Demeanor/Rapport:    Affect (typically observed):  Appropriate, Accepting Orientation:  Oriented to Self, Oriented to Place, Oriented to  Time, Oriented to  Situation Alcohol / Substance use:  Not Applicable Psych involvement (Current and /or in the community):  No (Comment)  Discharge Needs  Concerns to be addressed:  Lack of Support, Home Safety Concerns Readmission within the last 30 days:  Yes Current discharge risk:  Physical Impairment Barriers to Discharge:  Continued Medical Work up   Izora Ribas, LCSW 04/17/2015, 4:01 PM

## 2015-04-17 NOTE — Discharge Summary (Signed)
Physician Discharge Summary       [address]       [place] Mckin[place] [address]             [phone]    Patient ID: Charles Mckinney MRN: [id number] DOB/AGE: 01-14-1951 64 y.o.  Admit date: 04/05/2015 Discharge date: 04/22/2015  Admission Diagnoses: 1. 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 History of hyperlipidemia 3. History of hypertension 4. History of obesity 5. History of a fib 6. History of CHF 7. History of GERD 8. History of anxiety 9. A fib  Discharge Diagnoses:  1. 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 History of hyperlipidemia 3. History of hypertension 4. History of obesity 5. History of a fib 6. History of CHF 7. History of GERD 8. History of anxiety 9. A fib (s/p DCCV x 2, conversion to sinus rhythm) 10. ABL anemia  Procedure (s):  Cardiac catheterization done by Dr. [name] on 04/09/2015. Please see report in Dr. Sharee Pimple consult.  1. 2. Median Sternotomy 2. Extracorporeal circulation 3. Coronary artery bypass grafting x 4   Left internal mammary graft to the LAD  SVG to diagonal  Sequential SVG to PL RCA and PDA RCA.  4. Endoscopic vein harvest from the right leg 5. Clipping of left atrial appendage  History of Presenting Illness: The patient is a 63 year old morbidly obese gentleman with hypertension, a history of congestive heart failure secondary to systolic dysfunction with a know EF of 30-35% by echo on 02/01/2014, atrial fibrillation s/p cardioversion x 2 in February and then at the end of April, hypercholesterolemia, DJD who was readmitted on 5/1 with worsening shortness of breath that woke him up early in the morning. He has had shortness of breath, orthopnea, PND and leg swelling for a while but no recent chest pain. On presentation he was hypoxic with sats in the 80's and responded to bipap and lasix. 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 CXR on admission showed congestive  heart failure with increased interstitial edema. Echo on 4/27 showed an EF of 30-35% with diffuse hypokinesis and severe hypokinesis of the mid-apical inferior myocardium. There was no AS or AI, no MR. Cardiac cath shows a 95% distal LM stenosis that extends into the LAD with 99% ostial LAD which is a smallish vessel. There is a diagonal with high grade ostial stenosis that is also on the small side. It also extends into the ostial LCX with 90% stenosis and the LCX only has one OM branch that is small and probably not graftable. The RCA is a large dominant vessel with 80% proximal stenosis and 50% mid stenosis as well as 90% PDA stenosis. EF is about 25% with an LVEDP of 20. CI was 2.0-2.2. PAP was 37/11 with a wedge of 16. Dr. Laneta Simmers was consulted for the consideration of coronary artery bypass grating surgery. Potential risks, benefits, and complications were discussed and the patient agreed to proceed with surgery. Pre operative carotid duplex US showed no significant carotid artery stenosis bilaterally. He underwent a CABG x 4 on 04/13/2015.  Brief Hospital Course:  The patient was extubated late the evening of surgery without difficulty. He remained afebrile and hemodynamically stable. Theone Murdoch, a line, chest tubes, and foley were removed early in the post operative course. He had had DCCV x 2 prior to surgery for new onset a fib. Lopressor was started and titrated accordingly. He remained in  sinus rhythm. He was volume over loaded and diuresed. He had ABL anemia. He did not require a post op transfusion. His last H and H was stable at 10.1 and 31.1.He was weaned off the insulin drip. The patient's glucose remained well controlled. The patient's HGA1C pre op was 6.2. He will need further surveillance of his HGA1C by his medical doctor after discharge. The patient was felt surgically stable for transfer from the ICU to [facility] for further convalescence on 04/15/2015. He continues to progress with cardiac  rehab. He was ambulating on 2 liters of oxygen via [place]. He was weaned to room air. He has been tolerating a diet and has had a bowel movement. Epicardial pacing wires will be removed prior to discharge. Chest tube sutures to be removed by the SNF on Friday 04/29/2015. There is some slight redness at the distal sternal wound. No purulence or drainage noted. May be from suture reaction. Also, there is minor serous drainage and slight erythema around distal RLE wound. Per Dr. Laneta Simmers, will put on Bactrim (as allergic to Penciillin) for one week. The patient is felt surgically stable for discharge to SNF today.  We ask the SNF to please do the following:  1. Please obtain vital signs at least one time daily 2.Please weigh the patient daily. If he or she continues to gain weight or develops lower extremity edema, contact the office at [phone]. 3. Ambulate patient at least three times daily and please use sternal precautions. 4. Please apply dry gauze and tape to right lower extremity wound. If has worsening drainage or redness, fever, or chills, please call [facility] office to be seen  Latest Vital Signs: Blood pressure 130/72, pulse 82, temperature 97.6 F (36.4 C), temperature source Oral, resp. rate 18, height  (1.778 m), weight 324 lb 11.8 oz (147.3 kg), SpO2 95 %.  Physical Exam: Cardiovascular: RRR Pulmonary: Slightly diminished at bases bilaterally; no rales, wheezes, or rhonchi. Abdomen: Soft, non tender, bowel sounds present. Extremities: Bilateral lower extremity swelling. Wounds: Clean and dry. Minor erythema distal sternal wound but no purulence or drainage.  Discharge Condition: Stable and discharged to SNF.  Recent laboratory studies:  Lab Results  Component Value Date   WBC 8.5 04/20/2015   HGB 10.6* 04/20/2015   HCT 33.7* 04/20/2015   MCV 90.1 04/20/2015   PLT 347 04/20/2015   Lab Results  Component Value Date   NA 137 04/20/2015   K 3.9 04/20/2015   CL 99*  04/20/2015   CO2 29 04/20/2015   CREATININE 1.14 04/20/2015   GLUCOSE 108* 04/20/2015      Diagnostic Studies: Dg Chest Port 1 View  04/15/2015   CLINICAL DATA:  Coronary artery disease.  EXAM: PORTABLE CHEST - 1 VIEW  COMPARISON:  04/14/2015 .  FINDINGS: Interim removal of Swan-Ganz catheter and left chest tube. Right IJ sheath noted in good anatomic position. Prior CABG. Left atrial appendage clip noted. Cardiomegaly. Improved pulmonary venous congestion. Low lung volumes with basilar atelectasis and/or infiltrates. Left pleural effusion. No pneumothorax.  IMPRESSION: 1. Interim removal of Swan-Ganz catheter and left chest tube. Right IJ sheath in good anatomic position. 2. Prior CABG. Left atrial appendage clip noted. Cardiomegaly. Interim improvement of pulmonary venous congestion. 3. Persistent bibasilar atelectasis and/or infiltrates and left pleural effusion. No pneumothorax .   Electronically Signed   By: Maisie Fus  Register   On: 04/15/2015 07:51       Discharge Instructions    Amb Referral to Cardiac Rehabilitation  Complete by:  As directed   Congestive Heart Failure: If diagnosis is Heart Failure, patient MUST meet each of the CMS criteria: 1. Left Ventricular Ejection Fraction </= 35% 2. NYHA class II-IV symptoms despite being on optimal heart failure therapy for at least 6 weeks. 3. Stable = have not had a recent (<6 weeks) or planned (<6 months) major cardiovascular hospitalization or procedure  Program Details: - Physician supervised classes - 1-3 classes per week over a 12-18 week period, generally for a total of 36 sessions  Physician Certification: I certify that the above Cardiac Rehabilitation treatment is medically necessary and is medically approved by me for treatment of this patient. The patient is willing and cooperative, able to ambulate and medically stable to participate in exercise rehabilitation. The participant's progress and Individualized Treatment Plan will  be reviewed by the Medical Director, Cardiac Rehab staff and as indicated by the Referring/Ordering Physician.  Diagnosis:  CABG          Discharge Medications:   Medication List    STOP taking these medications        nitroGLYCERIN 0.4 MG SL tablet  Commonly known as:  NITROSTAT      TAKE these medications        acetaminophen 500 MG tablet  Commonly known as:  TYLENOL  Take 1,000 mg by mouth every 6 (six) hours as needed for moderate pain.     amiodarone 200 MG tablet  Commonly known as:  PACERONE  Take 1 tablet (200 mg total) by mouth daily.     aspirin 81 MG EC tablet  Take 1 tablet (81 mg total) by mouth daily. Swallow whole.     carvedilol 6.25 MG tablet  Commonly known as:  COREG  Take 1 tablet (6.25 mg total) by mouth 2 (two) times daily with a meal.     cholecalciferol 1000 UNITS tablet  Commonly known as:  VITAMIN D  Take 1,000 Units by mouth daily.     colchicine 0.6 MG tablet  Take 1 tablet (0.6 mg total) by mouth daily.     fluticasone 50 MCG/ACT nasal spray  Commonly known as:  FLONASE  Place 1 spray into both nostrils daily.     furosemide 40 MG tablet  Commonly known as:  LASIX  Take 1 tablet (40 mg total) by mouth daily.     lisinopril 2.5 MG tablet  Commonly known as:  PRINIVIL,ZESTRIL  Take 1 tablet (2.5 mg total) by mouth daily.     oxyCODONE 5 MG immediate release tablet  Commonly known as:  Oxy IR/ROXICODONE  Take 1-2 tablets (5-10 mg total) by mouth every 4 (four) hours as needed for severe pain.     potassium chloride SA 20 MEQ tablet  Commonly known as:  K-DUR,KLOR-CON  Take 1 tablet (20 mEq total) by mouth daily.     pravastatin 40 MG tablet  Commonly known as:  PRAVACHOL  Take 1 tablet (40 mg total) by mouth daily.     rivaroxaban 20 MG Tabs tablet  Commonly known as:  XARELTO  Take 1 tablet (20 mg total) by mouth daily with supper.     sulfamethoxazole-trimethoprim 800-160 MG per tablet  Commonly known as:  BACTRIM DS   Take 1 tablet by mouth 2 (two) times daily. For seven days then stop.        The patient has been discharged on:   1.Beta Blocker:  Yes [ x  ]  No   [   ]                              If No, reason:  2.Ace Inhibitor/ARB: Yes [  x ]                                     No  [    ]                                     If No, reason:  3.Statin:   Yes [ x  ]                  No  [   ]                  If No, reason:  4.Ecasa:  Yes  [ x  ]                  No   [   ]                  If No, reason:  Follow Up Appointments: Follow-up Information    Follow up with [facility], [place].   Specialty:  Cardiology   Why:  Call for a follow up appointment for 2 weeks   Contact information:   [address] [place] [address] [phone]       Follow up with Alleen Borne, MD On 05/27/2015.   Specialty:  Cardiothoracic Surgery   Why:  PA/LAT CXR (to be taken at [facility] which is in the same building as Dr. Sharee Pimple office) on 05/27/2015 at; 08:30 am; Appointment with Dr. Laneta Simmers is at 9:30 am   Contact information:   [address] [place] [address] [phone]       Follow up with SNF On 04/29/2015.   Why:  Please remove chest tube sutures on this date      Follow up with Medical doctor.   Why:  Obtain a primary care physician to monitor HGA1C 6.2 (pre diabetes)      Signed: Ritaj Dullea MPA-C 04/22/2015, 8:55 AM

## 2015-04-18 LAB — GLUCOSE, CAPILLARY: GLUCOSE-CAPILLARY: 117 mg/dL — AB (ref 65–99)

## 2015-04-18 MED ORDER — GUAIFENESIN ER 600 MG PO TB12
600.0000 mg | ORAL_TABLET | Freq: Two times a day (BID) | ORAL | Status: DC
  Administered 2015-04-18 – 2015-04-21 (×8): 600 mg via ORAL
  Filled 2015-04-18 (×10): qty 1

## 2015-04-18 MED ORDER — FUROSEMIDE 10 MG/ML IJ SOLN
40.0000 mg | Freq: Every day | INTRAMUSCULAR | Status: DC
  Administered 2015-04-18 – 2015-04-19 (×2): 40 mg via INTRAVENOUS
  Filled 2015-04-18 (×3): qty 4

## 2015-04-18 MED ORDER — SALINE SPRAY 0.65 % NA SOLN
1.0000 | NASAL | Status: DC | PRN
  Administered 2015-04-18 – 2015-04-20 (×2): 1 via NASAL

## 2015-04-18 NOTE — Progress Notes (Addendum)
       [address]       [place] [address]             [phone]          5 Days Post-Op Procedure(s) (LRB): CORONARY ARTERY BYPASS GRAFTING (CABG) (N/A) TRANSESOPHAGEAL ECHOCARDIOGRAM (TEE) (N/A) CLIPPING OF ATRIAL APPENDAGE  Subjective: "I'm coming down with a terrible cold."  C/o nasal congestion and sinus drainage. Became SOB when walking with CRPI.  No BM yet.   Objective: Vital signs in last 24 hours: Patient Vitals for the past 24 hrs:  BP Temp Temp src Pulse Resp SpO2 Weight  04/18/15 0527 121/72 mmHg 98.4 F (36.9 C) Oral 90 20 95 % (!) 327 lb 9.7 oz (148.6 kg)  04/17/15 1931 117/67 mmHg 98.4 F (36.9 C) Oral 83 20 99 % -  04/17/15 1744 117/64 mmHg - - 83 - - -  04/17/15 1323 116/68 mmHg 97.8 F (36.6 C) Oral 91 19 98 % -   Current Weight  04/18/15 327 lb 9.7 oz (148.6 kg)  PRE-OPERATIVE WEIGHT: 150kg   Intake/Output from previous day: 05/13 0701 - 05/14 0700 In: 360 [P.O.:360] Out: 1570 [Urine:1570]    PHYSICAL EXAM:  Heart: RRR Lungs: Diminished BS in bases Wound:Clean and dry Extremities: +LE edema    Lab Results: CBC: Recent Labs  04/16/15 0515  WBC 8.6  HGB 10.1*  HCT 31.1*  PLT 170   BMET:  Recent Labs  04/16/15 0515  NA 136  K 4.5  CL 102  CO2 27  GLUCOSE 96  BUN 14  CREATININE 1.00  CALCIUM 8.3*    PT/INR: No results for input(s): LABPROT, INR in the last 72 hours.    Assessment/Plan: S/P Procedure(s) (LRB): CORONARY ARTERY BYPASS GRAFTING (CABG) (N/A) TRANSESOPHAGEAL ECHOCARDIOGRAM (TEE) (N/A) CLIPPING OF ATRIAL APPENDAGE  CV- AF, now mostly SR.  Did have a brief run atrial tachycardia when walking.  Will continue Amio, Coreg and watch.  Vol overload- diurese.  Pulm- continue IS, pulm toilet.  GI- LOC today.  Disp- SNF next week.   LOS: 13 days    COLLINS,GINA H 04/18/2015   Chart reviewed, patient examined, agree with above. He still has a lot of lower extremity edema although  weight is coming down nicely.

## 2015-04-18 NOTE — Progress Notes (Signed)
CARDIAC REHAB PHASE I   PRE:  Rate/Rhythm: 93 SR    BP: sitting 127/70    SaO2: 95 RA  MODE:  Ambulation: 120 ft   POST:  Rate/Rhythm: 109 ST    BP: sitting 134/77     SaO2: 97 RA  Pt sts he feels terrible, coming down with a cold. C/o nasal congestion and SOB. Able to stand well but took many rest stops due to SOB and dizziness. Turned around after 60 ft, "I've got to go back". Leaning over RW. VSS. Return to recliner with feet elevated. Encouraged IS, elevating feet and more walking. [phone]   Elissa Lovett [place] CES, ACSM 04/18/2015 11:15 AM

## 2015-04-18 NOTE — Progress Notes (Signed)
Subjective:  Patient denies any chest pain complains of shortness of breath and nasal congestion.  Objective:  Vital Signs in the last 24 hours: Temp:  [97.8 F (36.6 C)-98.4 F (36.9 C)] 98.4 F (36.9 C) (05/14 0527) Pulse Rate:  [83-91] 90 (05/14 0527) Resp:  [19-20] 20 (05/14 0527) BP: (116-121)/(64-72) 121/72 mmHg (05/14 0527) SpO2:  [95 %-99 %] 95 % (05/14 0527) Weight:  [148.6 kg (327 lb 9.7 oz)] 148.6 kg (327 lb 9.7 oz) (05/14 0527)  Intake/Output from previous day: 05/13 0701 - 05/14 0700 In: 360 [P.O.:360] Out: 1570 [Urine:1570] Intake/Output from this shift: Total I/O In: -  Out: 200 [Urine:200]  Physical Exam: Neck: no adenopathy, no carotid bruit, no JVD and supple, symmetrical, trachea midline Lungs: Decreased breath sound at bases with bibasilar rales Heart: regular rate and rhythm, S1, S2 normal and Soft systolic murmur and S3 gallop noted Abdomen: soft, non-tender; bowel sounds normal; no masses,  no organomegaly Extremities: No clubbing cyanosis 3+ edema noted  Lab Results:  Recent Labs  04/16/15 0515  WBC 8.6  HGB 10.1*  PLT 170    Recent Labs  04/16/15 0515  NA 136  K 4.5  CL 102  CO2 27  GLUCOSE 96  BUN 14  CREATININE 1.00    Recent Labs  04/15/15 1550 04/17/15 1142  TROPONINI 0.39* 0.21*   Hepatic Function Panel No results for input(s): PROT, ALBUMIN, AST, ALT, ALKPHOS, BILITOT, BILIDIR, IBILI in the last 72 hours. No results for input(s): CHOL in the last 72 hours. No results for input(s): PROTIME in the last 72 hours.  Imaging: Imaging results have been reviewed and No results found.  Cardiac Studies:  Assessment/Plan:  Multivessel CAD with critical left main stenosis status post CABG Ischemic cardiomyopathy Volume overload Hypertension History of A. fib status post cardioversion 2 in the past Postop anemia Morbid obesity Obesity hypoventilation syndrome/obstructive sleep apnea Hypercholesteremia GERD Degenerative  joint disease Strong positive family history of coronary artery disease Plan Add IV Lasix 40 mg daily as per orders  LOS: 13 days    Desirre Eickhoff, Marlane Mingle 04/18/2015, 11:00 AM

## 2015-04-19 LAB — CK TOTAL AND CKMB (NOT AT ARMC)
CK, MB: 0.9 ng/mL (ref 0.5–5.0)
RELATIVE INDEX: 0.7 (ref 0.0–2.5)
Total CK: 134 U/L (ref 49–397)

## 2015-04-19 LAB — TROPONIN I: Troponin I: 0.1 ng/mL — ABNORMAL HIGH (ref <0.031)

## 2015-04-19 NOTE — Progress Notes (Signed)
Subjective:  Patient denies any chest pain states breathing has improved diuresing well with IV Lasix. Sinus congestion improved leg swelling gradually improving  Objective:  Vital Signs in the last 24 hours: Temp:  [98.1 F (36.7 C)-98.7 F (37.1 C)] 98.1 F (36.7 C) (05/15 0427) Pulse Rate:  [88-94] 90 (05/15 0427) Resp:  [20-22] 22 (05/15 0427) BP: (113-135)/(54-81) 123/77 mmHg (05/15 0427) SpO2:  [93 %-96 %] 93 % (05/15 0427) Weight:  [141.794 kg (312 lb 9.6 oz)] 141.794 kg (312 lb 9.6 oz) (05/15 0427)  Intake/Output from previous day: 05/14 0701 - 05/15 0700 In: 120 [P.O.:120] Out: 500 [Urine:500] Intake/Output from this shift:    Physical Exam: Neck: no adenopathy, no carotid bruit, no JVD and supple, symmetrical, trachea midline Lungs: decreased breath sound at bases air entry improved Heart: regular rate and rhythm, S1, S2 normal and Soft systolic murmur and S3 gallop noted Abdomen: soft, non-tender; bowel sounds normal; no masses,  no organomegaly Extremities: No clubbing cyanosis 2+ edema noted  Lab Results: No results for input(s): WBC, HGB, PLT in the last 72 hours. No results for input(s): NA, K, CL, CO2, GLUCOSE, BUN, CREATININE in the last 72 hours.  Recent Labs  04/17/15 1142 04/19/15 0915  TROPONINI 0.21* 0.10*   Hepatic Function Panel No results for input(s): PROT, ALBUMIN, AST, ALT, ALKPHOS, BILITOT, BILIDIR, IBILI in the last 72 hours. No results for input(s): CHOL in the last 72 hours. No results for input(s): PROTIME in the last 72 hours.  Imaging: Imaging results have been reviewed and No results found.  Cardiac Studies:  Assessment/Plan:  Multivessel CAD with critical left main stenosis status post CABG Ischemic cardiomyopathy Mild Volume overload Hypertension History of A. fib status post cardioversion 2 in the past Postop anemia Morbid obesity Obesity hypoventilation syndrome/obstructive sleep  apnea Hypercholesteremia GERD Degenerative joint disease Strong positive family history of coronary artery disease Plan Continue present management Check labs in a.m. Dr. [name] will follow from a.m.  LOS: 14 days    Charles Mckinney 04/19/2015, 12:51 PM

## 2015-04-19 NOTE — Clinical Social Work Placement (Signed)
   CLINICAL SOCIAL WORK PLACEMENT  NOTE  Date:  04/19/2015  Patient Details  Name: Charles Mckinney MRN: [id number] Date of Birth: 07/05/1951  Clinical Social Work is seeking post-discharge placement for this patient at the Skilled  Nursing Facility level of care (*CSW will initial, date and re-position this form in  chart as items are completed):  Yes   Patient/family provided with [facility] Clinical Social Work Department's list of facilities offering this level of care within the geographic area requested by the patient (or if unable, by the patient's family).  Yes   Patient/family informed of their freedom to choose among providers that offer the needed level of care, that participate in Medicare, Medicaid or managed care program needed by the patient, have an available bed and are willing to accept the patient.  Yes   Patient/family informed of [facility]'s ownership interest in [facility] and [facility], as well as of the fact that they are under no obligation to receive care at these facilities.  PASRR submitted to EDS on 04/19/15     PASRR number received on 04/19/15     Existing PASRR number confirmed on       FL2 transmitted to all facilities in geographic area requested by pt/family on 04/19/15     FL2 transmitted to all facilities within larger geographic area on       Patient informed that his/her managed care company has contracts with or will negotiate with certain facilities, including the following:            Patient/family informed of bed offers received.  Patient chooses bed at       Physician recommends and patient chooses bed at      Patient to be transferred to   on  .  Patient to be transferred to facility by       Patient family notified on   of transfer.  Name of family member notified:        PHYSICIAN Please sign FL2     Additional Comment:    _______________________________________________ Izora Ribas, LCSW 04/19/2015,  2:22 PM

## 2015-04-19 NOTE — Progress Notes (Addendum)
       [address]       [place] [address]             [phone]          6 Days Post-Op Procedure(s) (LRB): CORONARY ARTERY BYPASS GRAFTING (CABG) (N/A) TRANSESOPHAGEAL ECHOCARDIOGRAM (TEE) (N/A) CLIPPING OF ATRIAL APPENDAGE  Subjective: Sinus complaints better today.  Mildly nauseated, but just had a BM after receiving 2 laxatives.   Objective: Vital signs in last 24 hours: Patient Vitals for the past 24 hrs:  BP Temp Temp src Pulse Resp SpO2 Weight  04/19/15 0427 123/77 mmHg 98.1 F (36.7 C) Oral 90 (!) 22 93 % (!) 312 lb 9.6 oz (141.794 kg)  04/18/15 2042 (!) 113/54 mmHg 98.7 F (37.1 C) Oral 88 (!) 21 95 % -  04/18/15 1422 135/81 mmHg 98.4 F (36.9 C) Oral 94 20 96 % -   Current Weight  04/19/15 312 lb 9.6 oz (141.794 kg)  PRE-OPERATIVE WEIGHT: 150kg   Intake/Output from previous day: 05/14 0701 - 05/15 0700 In: 120 [P.O.:120] Out: 500 [Urine:500]    PHYSICAL EXAM:  Heart: RRR Lungs: Clear Wound: Clean and dry Extremities: +LE edema    Lab Results: CBC:No results for input(s): WBC, HGB, HCT, PLT in the last 72 hours. BMET: No results for input(s): NA, K, CL, CO2, GLUCOSE, BUN, CREATININE, CALCIUM in the last 72 hours.  PT/INR: No results for input(s): LABPROT, INR in the last 72 hours.    Assessment/Plan: S/P Procedure(s) (LRB): CORONARY ARTERY BYPASS GRAFTING (CABG) (N/A) TRANSESOPHAGEAL ECHOCARDIOGRAM (TEE) (N/A) CLIPPING OF ATRIAL APPENDAGE  CV- AF, now maintaining SR. Will continue Amio, Coreg and watch.  Vol overload- diuresing well. Continue Lasix.  Pulm- continue IS, pulm toilet.  GI- watch nausea and tx symptomatically.  Disp- SNF in next few days if he remains stable.   LOS: 14 days    COLLINS,GINA H 04/19/2015   Chart reviewed, patient examined, agree with above.

## 2015-04-19 NOTE — Progress Notes (Signed)
CSW received packet from [place] to fill out in order for patient to get [facility] coverage.  CSW completed packet with patient and faxed the packet to the [place]- they will review during their 1pm meeting on Monday.  CSW will continue to follow.  Merlyn Lot, LCSWA Clinical Social Worker [phone]

## 2015-04-20 LAB — CBC
HCT: 33.7 % — ABNORMAL LOW (ref 39.0–52.0)
Hemoglobin: 10.6 g/dL — ABNORMAL LOW (ref 13.0–17.0)
MCH: 28.3 pg (ref 26.0–34.0)
MCHC: 31.5 g/dL (ref 30.0–36.0)
MCV: 90.1 fL (ref 78.0–100.0)
Platelets: 347 10*3/uL (ref 150–400)
RBC: 3.74 MIL/uL — ABNORMAL LOW (ref 4.22–5.81)
RDW: 15.1 % (ref 11.5–15.5)
WBC: 8.5 10*3/uL (ref 4.0–10.5)

## 2015-04-20 LAB — BASIC METABOLIC PANEL
Anion gap: 9 (ref 5–15)
BUN: 17 mg/dL (ref 6–20)
CHLORIDE: 99 mmol/L — AB (ref 101–111)
CO2: 29 mmol/L (ref 22–32)
Calcium: 8.8 mg/dL — ABNORMAL LOW (ref 8.9–10.3)
Creatinine, Ser: 1.14 mg/dL (ref 0.61–1.24)
GFR calc Af Amer: >60 mL/min (ref >60)
Glucose, Bld: 108 mg/dL — ABNORMAL HIGH (ref 65–99)
Potassium: 3.9 mmol/L (ref 3.5–5.1)
Sodium: 137 mmol/L (ref 135–145)

## 2015-04-20 MED ORDER — OXYCODONE HCL 5 MG PO TABS: 5.0000 mg | ORAL_TABLET | ORAL | Status: DC | PRN

## 2015-04-20 MED ORDER — SULFAMETHOXAZOLE-TRIMETHOPRIM 800-160 MG PO TABS: 1.0000 | ORAL_TABLET | Freq: Two times a day (BID) | ORAL | Status: DC

## 2015-04-20 MED ORDER — POTASSIUM CHLORIDE CRYS ER 20 MEQ PO TBCR: 20.0000 meq | EXTENDED_RELEASE_TABLET | Freq: Every day | ORAL | Status: DC

## 2015-04-20 MED ORDER — FUROSEMIDE 10 MG/ML IJ SOLN
40.0000 mg | Freq: Once | INTRAMUSCULAR | Status: AC
  Administered 2015-04-20: 40 mg via INTRAVENOUS

## 2015-04-20 MED ORDER — CARVEDILOL 6.25 MG PO TABS: 6.2500 mg | ORAL_TABLET | Freq: Two times a day (BID) | ORAL | Status: DC

## 2015-04-20 MED ORDER — FUROSEMIDE 40 MG PO TABS: 40.0000 mg | ORAL_TABLET | Freq: Every day | ORAL | Status: DC

## 2015-04-20 MED ORDER — SULFAMETHOXAZOLE-TRIMETHOPRIM 800-160 MG PO TABS
1.0000 | ORAL_TABLET | Freq: Two times a day (BID) | ORAL | Status: DC
  Administered 2015-04-20 – 2015-04-21 (×4): 1 via ORAL
  Filled 2015-04-20 (×6): qty 1

## 2015-04-20 MED ORDER — FUROSEMIDE 40 MG PO TABS
40.0000 mg | ORAL_TABLET | Freq: Every day | ORAL | Status: DC
  Filled 2015-04-20: qty 1

## 2015-04-20 MED ORDER — AMIODARONE HCL 200 MG PO TABS: 200.0000 mg | ORAL_TABLET | Freq: Every day | ORAL | Status: DC

## 2015-04-20 MED ORDER — OXYCODONE HCL 5 MG PO TABS
5.0000 mg | ORAL_TABLET | ORAL | Status: DC | PRN
Start: 2015-04-20 — End: 2015-04-20

## 2015-04-20 MED ORDER — POTASSIUM CHLORIDE CRYS ER 20 MEQ PO TBCR
20.0000 meq | EXTENDED_RELEASE_TABLET | Freq: Once | ORAL | Status: AC
  Administered 2015-04-20: 20 meq via ORAL
  Filled 2015-04-20: qty 1

## 2015-04-20 NOTE — Progress Notes (Signed)
CSW spoke with [place]- they will review patient file this afternoon at 1pm and make decision about whether or not patient will go to [place] [facility] or will transfer to contracted facility.  CSW will continue to follow.  Merlyn Lot, LCSWA Clinical Social Worker [phone]

## 2015-04-20 NOTE — Progress Notes (Signed)
CARDIAC REHAB PHASE I   PRE:  Rate/Rhythm: 76 SR  BP:  Sitting: 108/56        SaO2: 95 RA  MODE:  Ambulation: 260 ft   POST:  Rate/Rhythm: 94 SR  BP:  Sitting: 121/62         SaO2: 96 RA  Pt resting in chair, agreeable to walk, refused gait belt. Pt demonstrated good use of sternal precautions. Pt ambulated 260 ft on RA, rolling walker, assist x1, steady gait, with improved tolerance. Brief standing rest x 2. Pt c/o mild DOE, much improved per pt. No other complaints. Pt returned to recliner after walk, feet elevated, call bell within reach.    [phone]   Joylene Grapes, RN, BSN 04/20/2015 2:01 PM

## 2015-04-20 NOTE — Progress Notes (Signed)
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 Pt presented to the ED with progressive SOB. 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   OT comments  Pt progressing. Education provided. Continue to recommend SNF for rehab.   Follow Up Recommendations  SNF;Supervision/Assistance - 24 hour    Equipment Recommendations  Other (comment) (defer to next venue)    Recommendations for Other Services      Precautions / Restrictions Precautions Precautions: Fall;Sternal Precaution Comments: reviewed sternal precautions; pt unable to recall all of them Restrictions Weight Bearing Restrictions: No       Mobility Bed Mobility               General bed mobility comments: not assessed  Transfers Overall transfer level: Needs assistance Transfers: Sit to/from Stand Sit to Stand: Supervision         General transfer comment: cue for precaution/hand placement    Balance Min guard-supervision for ambulation. No LOB in session-balance not formally assessed.                  ADL Overall ADL's : Needs assistance/impaired     Grooming: Oral care;Wash/dry face;Set up;Supervision/safety;Standing       Lower Body Bathing: Set up;Supervison/ safety (washed bottom while standing at sink)         Lower Body Dressing Details (indicate cue type and reason): pt able to don/doff shoe with no assist- shoe did not have back on it (slip on shoe)-pt attempted to reach to sock but was painful Toilet Transfer: Supervision/safety;Min guard;Ambulation;RW (chair)-ambulated with and without RW.           Functional mobility during ADLs: Supervision/safety;Min guard;Rolling walker (ambulated with and without walker)-supervision with RW and Min guard without RW.  ADL comments:  Educated on AE and energy conservation. Cues for deep breathing technique but pt reports difficulty with this technique. Pt not wanting to use walker for part of session. OT explained correct position of walker at sink.          Vision                     Perception     Praxis      Cognition  Awake/Alert Behavior During Therapy: WFL for tasks assessed/performed Overall Cognitive Status:  (unsure of baseline) Area of Impairment: Safety/judgement          Safety/Judgement: Decreased awareness of safety          Extremity/Trunk Assessment                  Shoulder Instructions       General Comments      Pertinent Vitals/ Pain       Pain Assessment: 0-10 Pain Score:  (1-2) Pain Location: chest Pain Descriptors / Indicators: Tightness Pain Intervention(s): Monitored during session  Home Living                                          Prior Functioning/Environment              Frequency Min 2X/week     Progress Toward Goals  OT Goals(current goals can now be found in  the care plan section)  Progress towards OT goals: Progressing toward goals-updated a goal  Acute Rehab OT Goals Patient Stated Goal: to walk  OT Goal Formulation: With patient Time For Goal Achievement: 04/24/15 Potential to Achieve Goals: Good ADL Goals Pt Will Perform Grooming: standing;with supervision (including gathering items ) Pt Will Perform Lower Body Bathing: with mod assist;sitting/lateral leans;sit to/from stand Pt Will Perform Lower Body Dressing: with max assist;with mod assist;sitting/lateral leans;sit to/from stand;with adaptive equipment Pt Will Transfer to Toilet: with supervision;ambulating;regular height toilet Additional ADL Goal #1: Pt will complete sit - stand maintaining sternal precautions without cues in prep for ADL mobility transitions  Plan Discharge plan remains appropriate    Co-evaluation                 End of  Session Equipment Utilized During Treatment: Gait belt;Rolling walker   Activity Tolerance Patient limited by fatigue   Patient Left in chair;with call bell/phone within reach;with family/visitor present   Nurse Communication Mobility status;Other (comment) (no chair alarm set-per nurse okay to not use)        Time: 6045-4098 OT Time Calculation (min): 17 min  Charges: OT General Charges $OT Visit: 1 Procedure OT Treatments $Self Care/Home Management : 8-22 mins   Earlie Raveling OTR/L [phone] 04/20/2015, 10:40 AM

## 2015-04-20 NOTE — Progress Notes (Signed)
Late entry. Ref: Default, Provider, MD   Subjective:  Asking for oxygen use at home. Oxygen saturations have improved post diuresis.  Objective:  Vital Signs in the last 24 hours: Temp:  [97.6 F (36.4 C)-97.7 F (36.5 C)] 97.7 F (36.5 C) (05/16 2026) Pulse Rate:  [71-86] 75 (05/16 2026) Cardiac Rhythm:  [-] Normal sinus rhythm (05/16 0830) Resp:  [18-20] 18 (05/16 2026) BP: (100-128)/(55-74) 103/55 mmHg (05/16 2026) SpO2:  [95 %-96 %] 95 % (05/16 2026) Weight:  [147.691 kg (325 lb 9.6 oz)] 147.691 kg (325 lb 9.6 oz) (05/16 0528)  Physical Exam: BP Readings from Last 1 Encounters:  04/20/15 103/55    Wt Readings from Last 1 Encounters:  04/20/15 147.691 kg (325 lb 9.6 oz)    Weight change: 5.897 kg (13 lb)  HEENT: [place]/AT, Eyes-Hazel, PERL, EOMI, Conjunctiva-Pink, Sclera-Non-icteric Neck: No JVD, No bruit, Trachea midline. Lungs:  Clear, Bilateral. Cardiac:  Regular rhythm, normal S1 and S2, no S3.  Abdomen:  Soft, non-tender. Extremities:  No edema present. No cyanosis. No clubbing. CNS: AxOx3, Cranial nerves grossly intact, moves all 4 extremities. Right handed. Skin: Warm and dry.   Intake/Output from previous day: 05/15 0701 - 05/16 0700 In: 480 [P.O.:480] Out: 1725 [Urine:1725]    Lab Results: BMET    Component Value Date/Time        NA 136 04/16/2015 0515   NA 134* 04/15/2015 0400   K 4.5 04/16/2015 0515   K 4.1 04/15/2015 0400   CL 102 04/16/2015 0515   CL 102 04/15/2015 0400   CO2 27 04/16/2015 0515   CO2 27 04/15/2015 0400   GLUCOSE 96 04/16/2015 0515   GLUCOSE 96 04/15/2015 0400   GLUCOSE 99 09/12/2006 1309   BUN 14 04/16/2015 0515   BUN 12 04/15/2015 0400   CREATININE 1.00 04/16/2015 0515   CREATININE 0.98 04/15/2015 0400   CALCIUM 8.3* 04/16/2015 0515   CALCIUM 8.0* 04/15/2015 0400   GFRNONAA >60 04/16/2015 0515   GFRNONAA >60 04/15/2015 0400   GFRAA >60 04/16/2015 0515   GFRAA >60 04/15/2015 0400   CBC    Component Value Date/Time   LYMPHSABS 1.7 04/05/2015 1900   MONOABS 0.6 04/05/2015 1900   EOSABS 0.2 04/05/2015 1900   BASOSABS 0.0 04/05/2015 1900   HEPATIC Function Panel  Recent Labs  01/13/15 0120 04/05/15 1900 04/11/15 0608  PROT 5.9* 6.3* 6.5   HEMOGLOBIN A1C No components found for: HGA1C,  MPG CARDIAC ENZYMES Lab Results  Component Value Date   TROPONINI 0.21* 04/17/2015   TROPONINI 0.39* 04/15/2015   BNP No results for input(s): PROBNP in the last 8760 hours. TSH  Recent Labs  01/14/15 1140 04/05/15 1900  TSH 3.727 5.204*   CHOLESTEROL No results for input(s): CHOL in the last 8760 hours.  Scheduled Meds: . amiodarone  200 mg Oral Daily  . aspirin EC  325 mg Oral Daily  . carvedilol  6.25 mg Oral BID WC  . docusate sodium  200 mg Oral Daily  . enoxaparin (LOVENOX) injection  40 mg Subcutaneous QHS  . famotidine  20 mg Oral BID  . [START ON 04/21/2015] furosemide  40 mg Oral Daily  . guaiFENesin  600 mg Oral BID  . lisinopril  2.5 mg Oral Daily  . pravastatin  40 mg Oral q1800  . sodium chloride  3 mL Intravenous Q12H  . sulfamethoxazole-trimethoprim  1 tablet Oral Q12H   Continuous Infusions:  PRN Meds:.sodium chloride, acetaminophen, bisacodyl **OR** bisacodyl, ondansetron **OR** ondansetron (  ZOFRAN) IV, oxyCODONE, sodium chloride, sodium chloride, traMADol  Assessment/Plan: Acute on chronic left heart systolic failure. Severe 3 Vessel coronary artery disease 4 vessel CABG-post op day 4 Atrial fibrillation, recurrent. Anemia of blood loss Morbid obesity Hyperlipidemia Hypertension Hypokalemia Prolonged QTc. GERD  Increase activity as tolerated.     LOS: 12 days    Orpah Cobb  MD  04/20/2015, 9:18 PM

## 2015-04-20 NOTE — Progress Notes (Signed)
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 Pt presented to the ED with progressive SOB. 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    PT Comments    Pt progressing towards physical therapy goals. Reports that he is feeling better today and states that he feels his breathing has improved since last PT session. Pt continues to require occasional hands-on guarding for safety and VC's to maintain sternal precautions, however overall is showing good improvement. Feel that pt will continue to require SNF at d/c as he lives alone. Will continue to follow.   Follow Up Recommendations  SNF;Supervision/Assistance - 24 hour     Equipment Recommendations  Rolling walker with 5" wheels    Recommendations for Other Services       Precautions / Restrictions Precautions Precautions: Fall;Sternal Precaution Comments: Pt able to reall sternal precautions, however requires cues to maintain Restrictions Weight Bearing Restrictions: No    Mobility  Bed Mobility               General bed mobility comments: Pt sitting up in the recliner upon arrival.   Transfers Overall transfer level: Needs assistance Equipment used: Rolling walker (2 wheeled) Transfers: Sit to/from Stand Sit to Stand: Min guard         General transfer comment: Pt was able to power-up to full standing position without physical assistance. Pt required cueing for proper hand placement and pillow use to maintain sternal precautions.   Ambulation/Gait Ambulation/Gait assistance: Supervision;Min guard Ambulation Distance (Feet): 225 Feet Assistive device: Rolling walker (2 wheeled) Gait Pattern/deviations: Step-through pattern;Decreased stride length;Trunk flexed Gait velocity: Decreased Gait velocity  interpretation: Below normal speed for age/gender General Gait Details: Pt was able to improve ambulation distance this session. Occasional standing rest breaks required for fatigue and DOE. Pt was cued for pursed-lip breathing however continues to state he cannot breathe through his nose for proper technique.    Stairs            Wheelchair Mobility    Modified Rankin (Stroke Patients Only)       Balance Overall balance assessment: Needs assistance Sitting-balance support: Feet supported;No upper extremity supported Sitting balance-Leahy Scale: Good     Standing balance support: Bilateral upper extremity supported Standing balance-Leahy Scale: Fair                      Cognition Arousal/Alertness: Awake/alert Behavior During Therapy: WFL for tasks assessed/performed Overall Cognitive Status: Within Functional Limits for tasks assessed                      Exercises General Exercises - Lower Extremity Ankle Circles/Pumps: 10 reps Quad Sets: 15 reps Long Arc Quad: 15 reps Hip ABduction/ADduction: 10 reps    General Comments        Pertinent Vitals/Pain Pain Assessment: Faces Faces Pain Scale: Hurts a little bit Pain Location: Incisional pain Pain Intervention(s): Limited activity within patient's tolerance;Monitored during session;Repositioned    Home Living                      Prior Function            PT Goals (current goals can now be found in the care plan section) Acute Rehab PT Goals Patient  Stated Goal: To get rehab after d/c PT Goal Formulation: With patient Time For Goal Achievement: 05/01/15 Potential to Achieve Goals: Good Progress towards PT goals: Progressing toward goals    Frequency  Min 3X/week    PT Plan Current plan remains appropriate    Co-evaluation             End of Session Equipment Utilized During Treatment: Gait belt Activity Tolerance: Patient tolerated treatment well Patient left: in  chair;with call bell/phone within reach     Time: 0754-0814 PT Time Calculation (min) (ACUTE ONLY): 20 min  Charges:  $Gait Training: 8-22 mins                    G Codes:      Conni Slipper 05-07-15, 8:53 AM   Conni Slipper, PT, DPT Acute Rehabilitation Services Pager: [phone]

## 2015-04-20 NOTE — Progress Notes (Addendum)
      [address]       [place], [address]             [phone]        7 Days Post-Op Procedure(s) (LRB): CORONARY ARTERY BYPASS GRAFTING (CABG) (N/A) TRANSESOPHAGEAL ECHOCARDIOGRAM (TEE) (N/A) CLIPPING OF ATRIAL APPENDAGE  Subjective: Patient feeling stronger. Has occasional incisional pain.  Objective: Vital signs in last 24 hours: Temp:  [97.6 F (36.4 C)-98.2 F (36.8 C)] 97.6 F (36.4 C) (05/16 0528) Pulse Rate:  [77-86] 86 (05/16 0528) Cardiac Rhythm:  [-] Normal sinus rhythm (05/16 0000) Resp:  [19-20] 20 (05/16 0528) BP: (108-128)/(67-74) 128/74 mmHg (05/16 0528) SpO2:  [95 %-96 %] 96 % (05/16 0528) Weight:  [325 lb 9.6 oz (147.691 kg)] 325 lb 9.6 oz (147.691 kg) (05/16 0528)  Pre op weight 150 kg Current Weight  04/20/15 325 lb 9.6 oz (147.691 kg)      Intake/Output from previous day: 05/15 0701 - 05/16 0700 In: 480 [P.O.:480] Out: 1725 [Urine:1725]   Physical Exam:  Cardiovascular: RRR Pulmonary: Clear to auscultation bilaterally; no rales, wheezes, or rhonchi. Abdomen: Soft, non tender, bowel sounds present. Extremities: Bilateral lower extremity swelling, but is decreasing Wounds: Clean and dry.  No erythema or signs of infection.  Lab Results: CBC:  Recent Labs  04/20/15 0445  WBC 8.5  HGB 10.6*  HCT 33.7*  PLT 347   BMET:   Recent Labs  04/20/15 0445  NA 137  K 3.9  CL 99*  CO2 29  GLUCOSE 108*  BUN 17  CREATININE 1.14  CALCIUM 8.8*    PT/INR:  Lab Results  Component Value Date   INR 1.23 04/13/2015   INR 1.06 04/09/2015   INR 1.25 04/05/2015   ABG:  INR: Will add last result for INR, ABG once components are confirmed Will add last 4 CBG results once components are confirmed  Assessment/Plan:  1. CV - Previous a fib. Maintaining SR in the 80's. On Amiodarone 200 mg daily, Coreg 6.25 mg bid, Lisinopril 2.5 mg daily. Will restart Xarelto at discharge along with baby ecasa. 2.  Pulmonary -  Weaned to room air.  Encourage incentive spirometer 3. Volume Overload - Continues to diuresis well. Will  need daily Lasix at discharge. 4.  Acute blood loss anemia - H and H stable at 10.6 and 33.7 5.Supplement potasssium 6. Will need SNF;ok to discharge to SNF when bed available   ZIMMERMAN,DONIELLE MPA-C 04/20/2015,7:27 AM   Chart reviewed, patient examined, agree with above.

## 2015-04-20 NOTE — Progress Notes (Signed)
Ref: Default, Provider, MD   Subjective:  Slow and steady progress. Asking for rehab prior to going home.  Objective:  Vital Signs in the last 24 hours: Temp:  [97.6 F (36.4 C)-97.7 F (36.5 C)] 97.7 F (36.5 C) (05/16 2026) Pulse Rate:  [71-86] 75 (05/16 2026) Cardiac Rhythm:  [-] Normal sinus rhythm (05/16 0830) Resp:  [18-20] 18 (05/16 2026) BP: (100-128)/(55-74) 103/55 mmHg (05/16 2026) SpO2:  [95 %-96 %] 95 % (05/16 2026) Weight:  [147.691 kg (325 lb 9.6 oz)] 147.691 kg (325 lb 9.6 oz) (05/16 0528)  Physical Exam: BP Readings from Last 1 Encounters:  04/20/15 103/55    Wt Readings from Last 1 Encounters:  04/20/15 147.691 kg (325 lb 9.6 oz)    Weight change: 5.897 kg (13 lb)  HEENT: [place]/AT, Eyes-Hazel, PERL, EOMI, Conjunctiva-Pink, Sclera-Non-icteric Neck: No JVD, No bruit, Trachea midline. Lungs:  Clear, Bilateral. Cardiac:  Regular rhythm, normal S1 and S2, no S3. II/VI systolic murmur. Abdomen:  Soft, non-tender. Extremities:  1 + edema present. No cyanosis. No clubbing. CNS: AxOx3, Cranial nerves grossly intact, moves all 4 extremities. Right handed. Skin: Warm and dry.   Intake/Output from previous day: 05/15 0701 - 05/16 0700 In: 480 [P.O.:480] Out: 1725 [Urine:1725]    Lab Results: BMET    Component Value Date/Time   NA 137 04/20/2015 0445   NA 136 04/16/2015 0515   NA 134* 04/15/2015 0400   K 3.9 04/20/2015 0445   K 4.5 04/16/2015 0515   K 4.1 04/15/2015 0400   CL 99* 04/20/2015 0445   CL 102 04/16/2015 0515   CL 102 04/15/2015 0400   CO2 29 04/20/2015 0445   CO2 27 04/16/2015 0515   CO2 27 04/15/2015 0400   GLUCOSE 108* 04/20/2015 0445   GLUCOSE 96 04/16/2015 0515   GLUCOSE 96 04/15/2015 0400   GLUCOSE 99 09/12/2006 1309   BUN 17 04/20/2015 0445   BUN 14 04/16/2015 0515   BUN 12 04/15/2015 0400   CREATININE 1.14 04/20/2015 0445   CREATININE 1.00 04/16/2015 0515   CREATININE 0.98 04/15/2015 0400   CALCIUM 8.8* 04/20/2015 0445   CALCIUM  8.3* 04/16/2015 0515   CALCIUM 8.0* 04/15/2015 0400   GFRNONAA >60 04/20/2015 0445   GFRNONAA >60 04/16/2015 0515   GFRNONAA >60 04/15/2015 0400   GFRAA >60 04/20/2015 0445   GFRAA >60 04/16/2015 0515   GFRAA >60 04/15/2015 0400   CBC    Component Value Date/Time   WBC 8.5 04/20/2015 0445   RBC 3.74* 04/20/2015 0445   HGB 10.6* 04/20/2015 0445   HCT 33.7* 04/20/2015 0445   PLT 347 04/20/2015 0445   MCV 90.1 04/20/2015 0445   MCH 28.3 04/20/2015 0445   MCHC 31.5 04/20/2015 0445   RDW 15.1 04/20/2015 0445   LYMPHSABS 1.7 04/05/2015 1900   MONOABS 0.6 04/05/2015 1900   EOSABS 0.2 04/05/2015 1900   BASOSABS 0.0 04/05/2015 1900   HEPATIC Function Panel  Recent Labs  01/13/15 0120 04/05/15 1900 04/11/15 0608  PROT 5.9* 6.3* 6.5   HEMOGLOBIN A1C No components found for: HGA1C,  MPG CARDIAC ENZYMES Lab Results  Component Value Date   CKTOTAL 134 04/19/2015   CKMB 0.9 04/19/2015   TROPONINI 0.10* 04/19/2015   TROPONINI 0.21* 04/17/2015   TROPONINI 0.39* 04/15/2015   BNP No results for input(s): PROBNP in the last 8760 hours. TSH  Recent Labs  01/14/15 1140 04/05/15 1900  TSH 3.727 5.204*   CHOLESTEROL No results for input(s): CHOL in the  last 8760 hours.  Scheduled Meds: . amiodarone  200 mg Oral Daily  . aspirin EC  325 mg Oral Daily  . carvedilol  6.25 mg Oral BID WC  . docusate sodium  200 mg Oral Daily  . enoxaparin (LOVENOX) injection  40 mg Subcutaneous QHS  . famotidine  20 mg Oral BID  . [START ON 04/21/2015] furosemide  40 mg Oral Daily  . guaiFENesin  600 mg Oral BID  . lisinopril  2.5 mg Oral Daily  . pravastatin  40 mg Oral q1800  . sodium chloride  3 mL Intravenous Q12H  . sulfamethoxazole-trimethoprim  1 tablet Oral Q12H   Continuous Infusions:  PRN Meds:.sodium chloride, acetaminophen, bisacodyl **OR** bisacodyl, ondansetron **OR** ondansetron (ZOFRAN) IV, oxyCODONE, sodium chloride, sodium chloride, traMADol  Assessment/Plan: Acute on  chronic left heart systolic failure. 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 Anemia of blood loss Morbid obesity Hyperlipidemia Hypertension Hypokalemia Prolonged QTc. GERD   Awaiting SNF. Increase activity.   LOS: 15 days    Orpah Cobb  MD  04/20/2015, 9:11 PM

## 2015-04-20 NOTE — Progress Notes (Addendum)
[facility] [place] [facility] on Wednesday, 5/18.  [facility] will [facility] between 8-9am on Wednesday morning.  Report #:[phone]  [facility] contact: Arline Asp [phone] ext [phone]  CSW informed pt of [facility] decision- pt wishes that he could be placed in [place] but understands that it is not approved.    CSW will continue to follow.  Merlyn Lot, LCSWA Clinical Social Worker [phone]

## 2015-04-21 MED ORDER — POTASSIUM CHLORIDE CRYS ER 20 MEQ PO TBCR
20.0000 meq | EXTENDED_RELEASE_TABLET | Freq: Two times a day (BID) | ORAL | Status: DC
  Administered 2015-04-21 (×2): 20 meq via ORAL
  Filled 2015-04-21 (×4): qty 1

## 2015-04-21 MED ORDER — FUROSEMIDE 40 MG PO TABS
40.0000 mg | ORAL_TABLET | Freq: Two times a day (BID) | ORAL | Status: DC
  Administered 2015-04-21 (×2): 40 mg via ORAL
  Filled 2015-04-21 (×5): qty 1

## 2015-04-21 MED ORDER — SULFAMETHOXAZOLE-TRIMETHOPRIM 800-160 MG PO TABS: 1.0000 | ORAL_TABLET | Freq: Two times a day (BID) | ORAL | Status: DC

## 2015-04-21 NOTE — Progress Notes (Addendum)
CARDIAC REHAB PHASE I   PRE:  Rate/Rhythm: 76 SR  BP:  Sitting: 126/63        SaO2: 96 RA  MODE:  Ambulation: 260 ft   POST:  Rate/Rhythm: 103 ST  BP:  Sitting: 133/67         SaO2: 94 R  Pt ambulated 260 ft on RA, assist x1, rolling walker, steady gait, tolerated well. Standing rest x1. Pt c/o DOE, states he is tired today. [facility] discharge education completed. Reviewed IS, sternal precautions, activity progression, exercise, risk factors, diet including heart healthy, sodium and fluid restrictions, daily weights (gave pt CHF booklet), phase 2 cardiac rehab. Pt verbalized understanding. Pt states he does not have a scale at home to weigh himself that he can read because his digital scale reads "error every time". RN notified of this. Pt states he already watched the cardiac surgery discharge video. Pt agrees to phase 2 cardiac rehab. Will send referral to [facility]. To recliner after walk, call bell within reach.  [phone]   Joylene Grapes, RN, BSN 04/21/2015 10:38 AM

## 2015-04-21 NOTE — Progress Notes (Addendum)
      [address]       [place] [address]             [phone]        8 Days Post-Op Procedure(s) (LRB): CORONARY ARTERY BYPASS GRAFTING (CABG) (N/A) TRANSESOPHAGEAL ECHOCARDIOGRAM (TEE) (N/A) CLIPPING OF ATRIAL APPENDAGE  Subjective: Patient trying to get clothes on as transport is here to take him to [place] facility.  Objective: Vital signs in last 24 hours: Temp:  [97.6 F (36.4 C)-98.4 F (36.9 C)] 98.4 F (36.9 C) (05/17 0423) Pulse Rate:  [71-79] 79 (05/17 0423) Cardiac Rhythm:  [-] Normal sinus rhythm (05/16 1945) Resp:  [18-19] 18 (05/17 0423) BP: (100-125)/(55-78) 125/78 mmHg (05/17 0423) SpO2:  [92 %-96 %] 92 % (05/17 0423) Weight:  [326 lb 1 oz (147.9 kg)] 326 lb 1 oz (147.9 kg) (05/17 0423)  Pre op weight 150 kg Current Weight  04/21/15 326 lb 1 oz (147.9 kg)      Intake/Output from previous day: 05/16 0701 - 05/17 0700 In: 1522 [P.O.:1522] Out: 1725 [Urine:1725]   Physical Exam:  Cardiovascular: RRR Pulmonary: Clear to auscultation bilaterally; no rales, wheezes, or rhonchi. Abdomen: Soft, non tender, bowel sounds present. Extremities: Bilateral lower extremity swelling, but is decreasing Wounds: Sternal wound is clean and dry.  Slight erythema lower sternal skin edges. There is slight erythema surrounding right lower leg wound with minor serous drainage (from swelling).  Lab Results: CBC:  Recent Labs  04/20/15 0445  WBC 8.5  HGB 10.6*  HCT 33.7*  PLT 347   BMET:   Recent Labs  04/20/15 0445  NA 137  K 3.9  CL 99*  CO2 29  GLUCOSE 108*  BUN 17  CREATININE 1.14  CALCIUM 8.8*    PT/INR:  Lab Results  Component Value Date   INR 1.23 04/13/2015   INR 1.06 04/09/2015   INR 1.25 04/05/2015   ABG:  INR: Will add last result for INR, ABG once components are confirmed Will add last 4 CBG results once components are confirmed  Assessment/Plan:  1. CV - Previous a fib. Maintaining SR in the 80's. On  Amiodarone 200 mg daily, Coreg 6.25 mg bid, Lisinopril 2.5 mg daily. Will restart Xarelto along with baby ecasa at discharge. 2.  Pulmonary - Weaned to room air.  Encourage incentive spirometer 3. Volume Overload - Continues to diuresis well. Will  need daily Lasix at discharge.  4.  Acute blood loss anemia - Last H and H stable at 10.6 and 33.7 5. Continue Bactrim DS for one week for RLE wound. RLE wound is to be kept dry with daily dressing changes. 6. Per CSW, has been accepted in [facility] facility and will go this morning.   ZIMMERMAN,DONIELLE MPA-C 04/21/2015,7:37 AM

## 2015-04-21 NOTE — Progress Notes (Signed)
Ref: Default, Provider, MD   Subjective:  [facility]. Feeling little better on daily basis.  Objective:  Vital Signs in the last 24 hours: Temp:  [97.7 F (36.5 C)-98.4 F (36.9 C)] 98.4 F (36.9 C) (05/17 0423) Pulse Rate:  [75-79] 79 (05/17 0423) Cardiac Rhythm:  [-] Normal sinus rhythm (05/16 1945) Resp:  [18] 18 (05/17 0423) BP: (103-125)/(55-78) 111/75 mmHg (05/17 1146) SpO2:  [92 %-95 %] 92 % (05/17 0423) Weight:  [147.9 kg (326 lb 1 oz)] 147.9 kg (326 lb 1 oz) (05/17 0423)  Physical Exam: BP Readings from Last 1 Encounters:  04/21/15 111/75    Wt Readings from Last 1 Encounters:  04/21/15 147.9 kg (326 lb 1 oz)    Weight change: 0.209 kg (7.4 oz)  HEENT: [place]/AT, Eyes-Hazel, PERL, EOMI, Conjunctiva-Pink, Sclera-Non-icteric Neck: No JVD, No bruit, Trachea midline. Lungs:  Clear, Bilateral. Cardiac:  Regular rhythm, normal S1 and S2, no S3. II/VI systolic murmur. Abdomen:  Soft, non-tender. Extremities:  1 + edema present. No cyanosis. No clubbing. CNS: AxOx3, Cranial nerves grossly intact, moves all 4 extremities. Right handed. Skin: Warm and dry.   Intake/Output from previous day: 05/16 0701 - 05/17 0700 In: 1522 [P.O.:1522] Out: 1725 [Urine:1725]    Lab Results: BMET    Component Value Date/Time   NA 137 04/20/2015 0445   NA 136 04/16/2015 0515   NA 134* 04/15/2015 0400   K 3.9 04/20/2015 0445   K 4.5 04/16/2015 0515   K 4.1 04/15/2015 0400   CL 99* 04/20/2015 0445   CL 102 04/16/2015 0515   CL 102 04/15/2015 0400   CO2 29 04/20/2015 0445   CO2 27 04/16/2015 0515   CO2 27 04/15/2015 0400   GLUCOSE 108* 04/20/2015 0445   GLUCOSE 96 04/16/2015 0515   GLUCOSE 96 04/15/2015 0400   GLUCOSE 99 09/12/2006 1309   BUN 17 04/20/2015 0445   BUN 14 04/16/2015 0515   BUN 12 04/15/2015 0400   CREATININE 1.14 04/20/2015 0445   CREATININE 1.00 04/16/2015 0515   CREATININE 0.98 04/15/2015 0400   CALCIUM 8.8* 04/20/2015 0445    CALCIUM 8.3* 04/16/2015 0515   CALCIUM 8.0* 04/15/2015 0400   GFRNONAA >60 04/20/2015 0445   GFRNONAA >60 04/16/2015 0515   GFRNONAA >60 04/15/2015 0400   GFRAA >60 04/20/2015 0445   GFRAA >60 04/16/2015 0515   GFRAA >60 04/15/2015 0400   CBC    Component Value Date/Time   WBC 8.5 04/20/2015 0445   RBC 3.74* 04/20/2015 0445   HGB 10.6* 04/20/2015 0445   HCT 33.7* 04/20/2015 0445   PLT 347 04/20/2015 0445   MCV 90.1 04/20/2015 0445   MCH 28.3 04/20/2015 0445   MCHC 31.5 04/20/2015 0445   RDW 15.1 04/20/2015 0445   LYMPHSABS 1.7 04/05/2015 1900   MONOABS 0.6 04/05/2015 1900   EOSABS 0.2 04/05/2015 1900   BASOSABS 0.0 04/05/2015 1900   HEPATIC Function Panel  Recent Labs  01/13/15 0120 04/05/15 1900 04/11/15 0608  PROT 5.9* 6.3* 6.5   HEMOGLOBIN A1C No components found for: HGA1C,  MPG CARDIAC ENZYMES Lab Results  Component Value Date   CKTOTAL 134 04/19/2015   CKMB 0.9 04/19/2015   TROPONINI 0.10* 04/19/2015   TROPONINI 0.21* 04/17/2015   TROPONINI 0.39* 04/15/2015   BNP No results for input(s): PROBNP in the last 8760 hours. TSH  Recent Labs  01/14/15 1140 04/05/15 1900  TSH 3.727 5.204*   CHOLESTEROL No results for input(s):  CHOL in the last 8760 hours.  Scheduled Meds: . amiodarone  200 mg Oral Daily  . aspirin EC  325 mg Oral Daily  . carvedilol  6.25 mg Oral BID WC  . docusate sodium  200 mg Oral Daily  . enoxaparin (LOVENOX) injection  40 mg Subcutaneous QHS  . famotidine  20 mg Oral BID  . furosemide  40 mg Oral BID  . guaiFENesin  600 mg Oral BID  . lisinopril  2.5 mg Oral Daily  . potassium chloride  20 mEq Oral BID  . pravastatin  40 mg Oral q1800  . sodium chloride  3 mL Intravenous Q12H  . sulfamethoxazole-trimethoprim  1 tablet Oral Q12H   Continuous Infusions:  PRN Meds:.sodium chloride, acetaminophen, bisacodyl **OR** bisacodyl, ondansetron **OR** ondansetron (ZOFRAN) IV, oxyCODONE, sodium chloride, sodium chloride,  traMADol  Assessment/Plan: Acute on chronic left heart systolic failure. 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 Anemia of blood loss Morbid obesity Hyperlipidemia Hypertension Hypokalemia Prolonged QTc. GERD  [facility]     LOS: 16 days    Orpah Cobb  MD  04/21/2015, 1:24 PM

## 2015-04-22 MED ORDER — SULFAMETHOXAZOLE-TRIMETHOPRIM 800-160 MG PO TABS: 1.0000 | ORAL_TABLET | Freq: Two times a day (BID) | ORAL | Status: DC

## 2015-04-22 NOTE — Discharge Instructions (Signed)
We ask the SNF to please do the following: 1. Please obtain vital signs at least one time daily 2.Please weigh the patient daily. If he or she continues to gain weight or develops lower extremity edema, contact the office at [phone]. 3. Ambulate patient at least three times daily and please use sternal precautions. 4. Please apply dry gauze and tape to right lower extremity wound. If has worsening drainage or redness, fever, or chills, please call TCTS office to be seen  Activity: 1.May walk up steps                2.No lifting more than ten pounds for four weeks.                 3.No driving for four weeks.                4.Stop any activity that causes chest pain, shortness of breath, dizziness, sweating or excessive weakness.                5.Avoid straining.                6.Continue with your breathing exercises daily.  Diet: Diabetic diet and Low fat, Low salt diet  Wound Care: May shower.  Clean wounds with mild soap and water daily. Contact the office at [phone] if any problems arise.  Coronary Artery Bypass Grafting, Care After Refer to this sheet in the next few weeks. These instructions provide you with information on caring for yourself after your procedure. Your health care provider may also give you more specific instructions. Your treatment has been planned according to current medical practices, but problems sometimes occur. Call your health care provider if you have any problems or questions after your procedure. WHAT TO EXPECT AFTER THE PROCEDURE Recovery from surgery will be different for everyone. Some people feel well after 3 or 4 weeks, while for others it takes longer. After your procedure, it is typical to have the following:  Nausea and a lack of appetite.   Constipation.  Weakness and fatigue.   Depression or irritability.   Pain or discomfort at your incision site. HOME CARE INSTRUCTIONS  Take medicines only as directed by your health care  provider. Do not stop taking medicines or start any new medicines without first checking with your health care provider.  Take your pulse as directed by your health care provider.  Perform deep breathing as directed by your health care provider. If you were given a device called an incentive spirometer, use it to practice deep breathing several times a day. Support your chest with a pillow or your arms when you take deep breaths or cough.  Keep incision areas clean, dry, and protected. Remove or change any bandages (dressings) only as directed by your health care provider. You may have skin adhesive strips over the incision areas. Do not take the strips off. They will fall off on their own.  Check incision areas daily for any swelling, redness, or drainage.  If incisions were made in your legs, do the following:  Avoid crossing your legs.   Avoid sitting for long periods of time. Change positions every 30 minutes.   Elevate your legs when you are sitting.  Wear compression stockings as directed by your health care provider. These stockings help keep blood clots from forming in your legs.  Take showers once your health care provider approves. Until then, only take sponge baths. Pat incisions dry. Do not  rub incisions with a washcloth or towel. Do not take baths, swim, or use a hot tub until your health care provider approves.  Eat foods that are high in fiber, such as raw fruits and vegetables, whole grains, beans, and nuts. Meats should be lean cut. Avoid canned, processed, and fried foods.  Drink enough fluid to keep your urine clear or pale yellow.  Weigh yourself every day. This helps identify if you are retaining fluid that may make your heart and lungs work harder.  Rest and limit activity as directed by your health care provider. You may be instructed to:  Stop any activity at once if you have chest pain, shortness of breath, irregular heartbeats, or dizziness. Get help right  away if you have any of these symptoms.  Move around frequently for short periods or take short walks as directed by your health care provider. Increase your activities gradually. You may need physical therapy or cardiac rehabilitation to help strengthen your muscles and build your endurance.  Avoid lifting, pushing, or pulling anything heavier than 10 lb (4.5 kg) for at least 6 weeks after surgery.  Do not drive until your health care provider approves.  Ask your health care provider when you may return to work.  Ask your health care provider when you may resume sexual activity.  Keep all follow-up visits as directed by your health care provider. This is important. SEEK MEDICAL CARE IF:  You have swelling, redness, increasing pain, or drainage at the site of an incision.  You have a fever.  You have swelling in your ankles or legs.  You have pain in your legs.   You gain 2 or more pounds (0.9 kg) a day.  You are nauseous or vomit.  You have diarrhea. SEEK IMMEDIATE MEDICAL CARE IF:  You have chest pain that goes to your jaw or arms.  You have shortness of breath.   You have a fast or irregular heartbeat.   You notice a "clicking" in your breastbone (sternum) when you move.   You have numbness or weakness in your arms or legs.  You feel dizzy or light-headed.  MAKE SURE YOU:  Understand these instructions.  Will watch your condition.  Will get help right away if you are not doing well or get worse. Document Released: 06/10/2005 Document Revised: 04/07/2014 Document Reviewed: 04/30/2013 [facility] Patient Information 2015 [place], [place]. This information is not intended to replace advice given to you by your health care provider. Make sure you discuss any questions you have with your health care provider.

## 2015-04-22 NOTE — Care Management Note (Signed)
Case Management Note  Patient Details  Name: ANDROS LENKER MRN: [id number] Date of Birth: May 22, 1951  Subjective/Objective:   Pt s/p CABG                 Action/Plan: Pt to d/c to [facility] SN[facility] today  Expected Discharge Date:  04/20/15               Expected Discharge Plan:  Skilled Nursing Facility  In-House Referral:  Clinical Social Work  Discharge planning Services  CM Consult  Post Acute Care Choice:    Choice offered to:     DME Arranged:    DME Agency:     HH Arranged:    HH Agency:     Status of Service:  Completed, signed off  Medicare Important Message Given:  No Date Medicare IM Given:    Medicare IM give by:    Date Additional Medicare IM Given:    Additional Medicare Important Message give by:     If discussed at Long Length of Stay Meetings, dates discussed:    Additional Comments:  Darrold Span, RN 04/22/2015, 11:21 AM

## 2015-04-22 NOTE — Progress Notes (Signed)
PT Cancellation Note  Patient Details Name: Charles Mckinney MRN: [id number] DOB: 28-Aug-1951   Cancelled Treatment:    Reason Eval/Treat Not Completed: Pt to d/c today. Will defer further PT to next venue of care. If d/c is delayed, will continue to see inpatient until pt transfers to SNF.    Conni Slipper 04/22/2015, 8:14 AM   Conni Slipper, PT, DPT Acute Rehabilitation Services Pager: [phone]

## 2015-05-15 ENCOUNTER — Telehealth: Payer: Self-pay | Admitting: *Deleted

## 2015-05-15 NOTE — Telephone Encounter (Signed)
Called patient to do follow-up phone call for the LEVO-CTS trial. Charles Mckinney is at the [facility] hospital for rehab post CABG. He states he has been doing well and has no events since his discharge. Patient said he is to be discharged soon.

## 2015-05-25 ENCOUNTER — Other Ambulatory Visit: Payer: Self-pay | Admitting: Surgery

## 2015-05-25 DIAGNOSIS — Z951 Presence of aortocoronary bypass graft: Secondary | ICD-10-CM

## 2015-05-27 ENCOUNTER — Ambulatory Visit
Admission: RE | Admit: 2015-05-27 | Discharge: 2015-05-27 | Disposition: A | Discharge status: Home / Self Care | Payer: Non-veteran care | Source: Ambulatory Visit | Attending: Surgery | Admitting: Surgery

## 2015-05-27 ENCOUNTER — Encounter: Payer: Self-pay | Admitting: Surgery

## 2015-05-27 ENCOUNTER — Ambulatory Visit (INDEPENDENT_AMBULATORY_CARE_PROVIDER_SITE_OTHER): Payer: Non-veteran care | Admitting: Surgery

## 2015-05-27 ENCOUNTER — Ambulatory Visit: Payer: Self-pay | Admitting: Surgery

## 2015-05-27 VITALS — BP 120/73 | HR 68 | Resp 20 | Ht 70.0 in | Wt 322.0 lb

## 2015-05-27 DIAGNOSIS — Z951 Presence of aortocoronary bypass graft: Secondary | ICD-10-CM

## 2015-05-27 NOTE — Progress Notes (Signed)
HPI:  Patient returns for routine postoperative follow-up having undergone CABG x 4 and clipping of the LAA on 04/13/2015. The patient's early postoperative recovery while in the hospital was notable for a slow postop course due to morbid obesity and deconditioning. He was discharge to SNF. He is now back at home and caring for himself Since hospital discharge the patient reports that he has slowly been getting stronger. He is walking with a cane for balance. He has been watching his diet and is losing weight.   Current Outpatient Prescriptions  Medication Sig Dispense Refill  . acetaminophen (TYLENOL) 500 MG tablet Take 1,000 mg by mouth every 6 (six) hours as needed for moderate pain.    Marland Kitchen amiodarone (PACERONE) 200 MG tablet Take 1 tablet (200 mg total) by mouth daily. 60 tablet 0  . aspirin 81 MG EC tablet Take 1 tablet (81 mg total) by mouth daily. Swallow whole. (Patient taking differently: Take 81 mg by mouth daily as needed for pain. Swallow whole.) 30 tablet 12  . carvedilol (COREG) 6.25 MG tablet Take 1 tablet (6.25 mg total) by mouth 2 (two) times daily with a meal.    . cholecalciferol (VITAMIN D) 1000 UNITS tablet Take 1,000 Units by mouth daily.    . colchicine 0.6 MG tablet Take 1 tablet (0.6 mg total) by mouth daily. 10 tablet 0  . fluticasone (FLONASE) 50 MCG/ACT nasal spray Place 1 spray into both nostrils daily.    . furosemide (LASIX) 40 MG tablet Take 1 tablet (40 mg total) by mouth daily. 60 tablet 12  . lisinopril (PRINIVIL,ZESTRIL) 2.5 MG tablet Take 1 tablet (2.5 mg total) by mouth daily. 30 tablet 0  . potassium chloride SA (K-DUR,KLOR-CON) 20 MEQ tablet Take 1 tablet (20 mEq total) by mouth daily. 60 tablet 12  . pravastatin (PRAVACHOL) 40 MG tablet Take 1 tablet (40 mg total) by mouth daily. 90 tablet 3  . rivaroxaban (XARELTO) 20 MG TABS tablet Take 1 tablet (20 mg total) by mouth daily with supper. 30 tablet 12   No current facility-administered medications  for this visit.    Physical Exam: BP 120/73 mmHg  Pulse 68  Resp 20  Ht  (1.778 m)  Wt 322 lb (146.058 kg)  BMI 46.20 kg/m2  SpO2 96% He looks well. Lung exam is clear. Cardiac exam shows a regular rate and rhythm with normal heart sounds. Chest incision is healing well and sternum is stable. The leg incisions are healing well and there is no peripheral edema.    Diagnostic Tests:   CLINICAL DATA: CABG.  EXAM: CHEST 2 VIEW  COMPARISON: 04/15/2015.  FINDINGS: Interval removal of right IJ sheath. Mediastinum and hilar structures normal. Lungs are clear. Prior CABG. Left atrial appendage clip. Cardiomegaly. No pulmonary venous congestion. Interim clearing of bibasilar atelectasis and/or infiltrates.  IMPRESSION: 1. Interim removal of right IJ sheath. 2. Prior CABG. Stable cardiomegaly . Interim clearing of bibasilar atelectasis and or infiltrates.   Electronically Signed  By: Maisie Fus Register  On: 05/27/2015 10:50     Impression:  Overall I think he is doing well considering his morbid obesity and preop deconditioning. I encouraged him to continue walking. He is planning to participate in cardiac rehab. I told him he could drive his car but should not lift anything heavier than 10 lbs for three months postop.   Plan:  He has an appt next week with his PCP but has not heard from Dr. Roseanne Kaufman  office yet about a follow up appt.. He does not need to return to see me unless he has problems with his incisions.   Alleen Borne, MD [facility] [phone]

## 2015-07-13 ENCOUNTER — Telehealth: Payer: Self-pay | Admitting: *Deleted

## 2015-07-13 NOTE — Telephone Encounter (Signed)
Charles Mckinney returned my phone call. I called him for his 90 day follow-up for the LEVO-CTS trial. Charles Mckinney states he has been doing well he has had no adverse events or hospitalizations since his surgery. His follow-up is complete for the trial.

## 2015-08-23 IMAGING — DX DG ANKLE COMPLETE 3+V*L*
3 series · 3 of 3 positions shown · non-contrast
Comparison: None.

CLINICAL DATA: Patient with lateral ankle pain this morning upon
standing. Inability to bear weight. No new injury.

EXAM:
LEFT ANKLE COMPLETE - 3+ VIEW

[ankle ap]
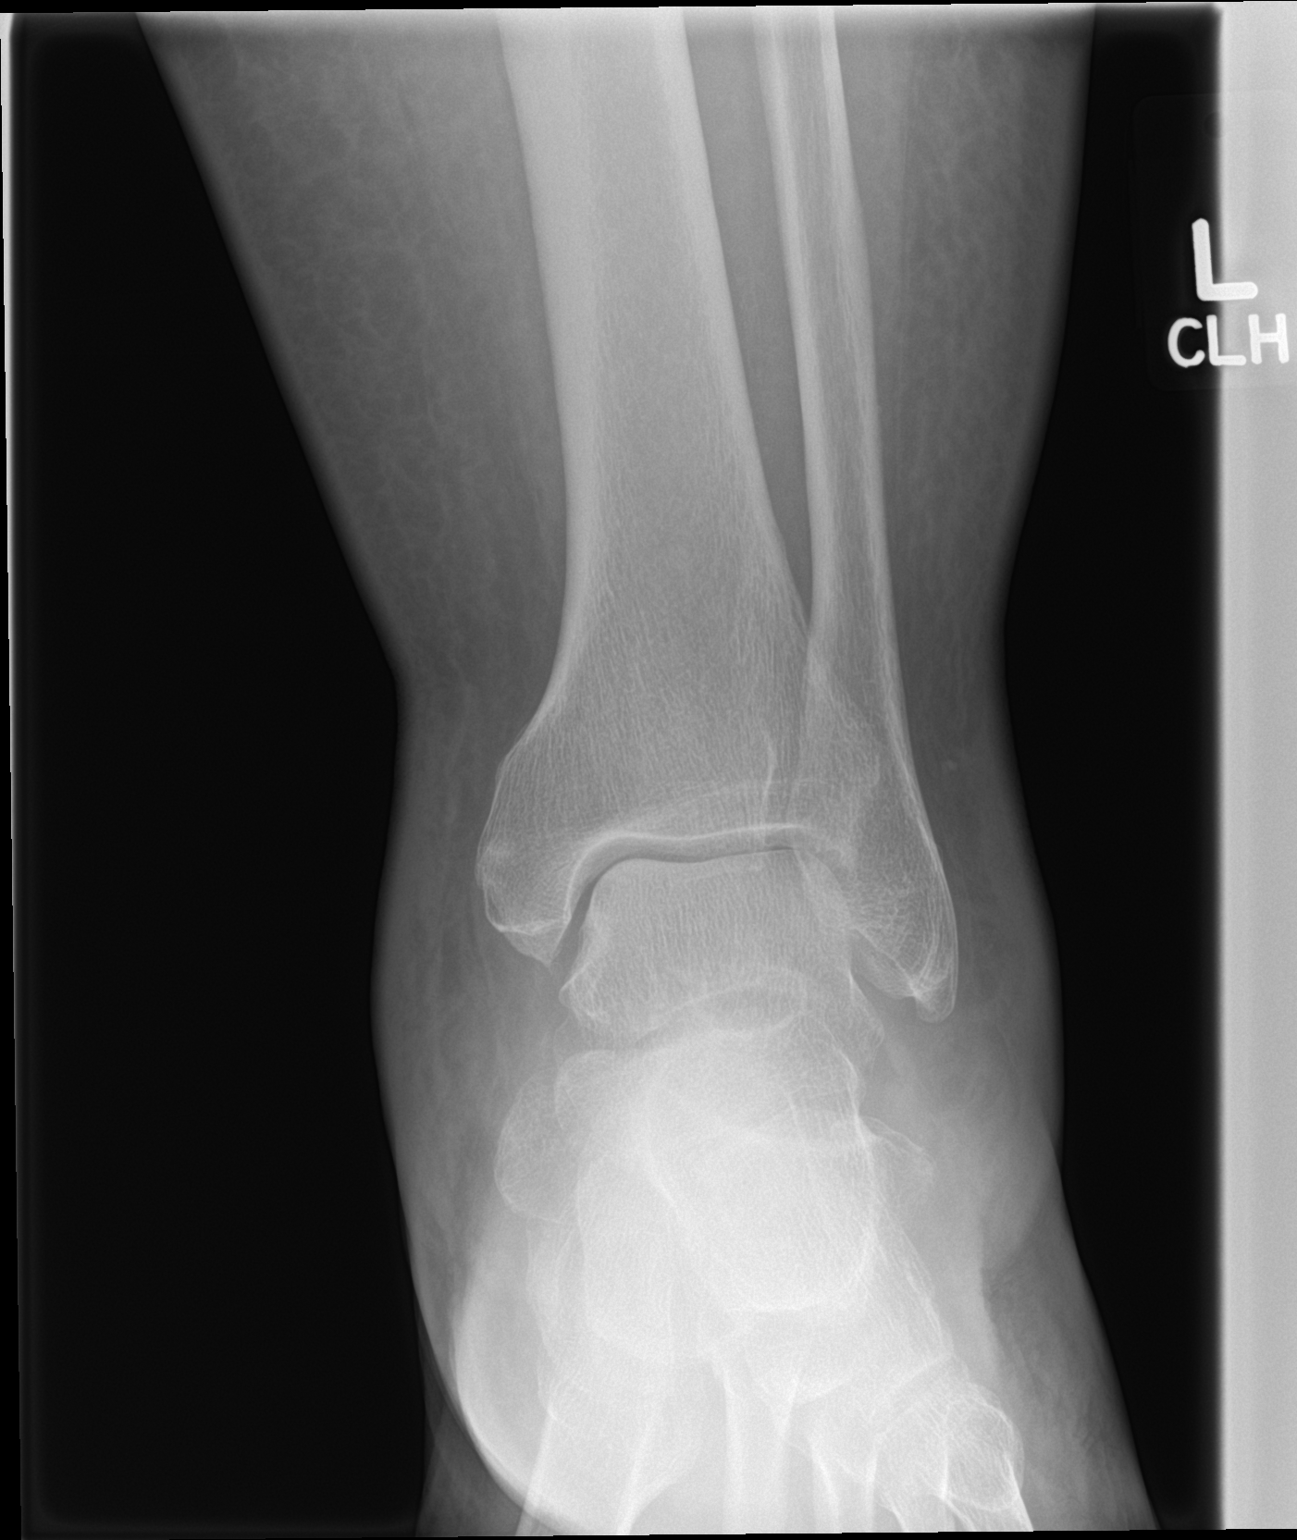

[ankle obl]
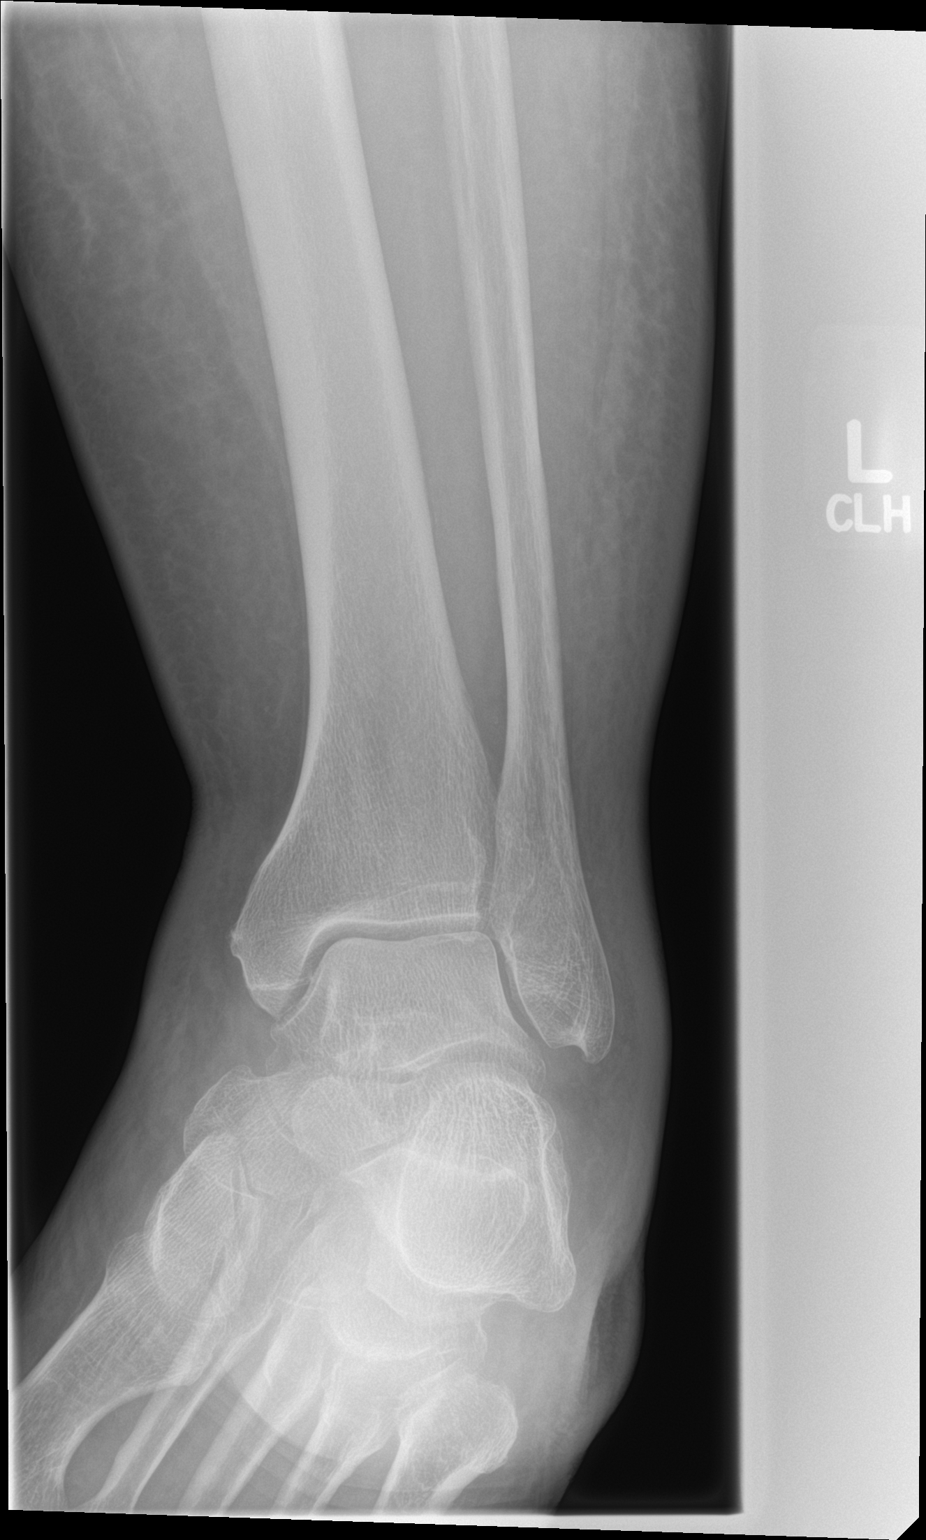

[ankle lat]
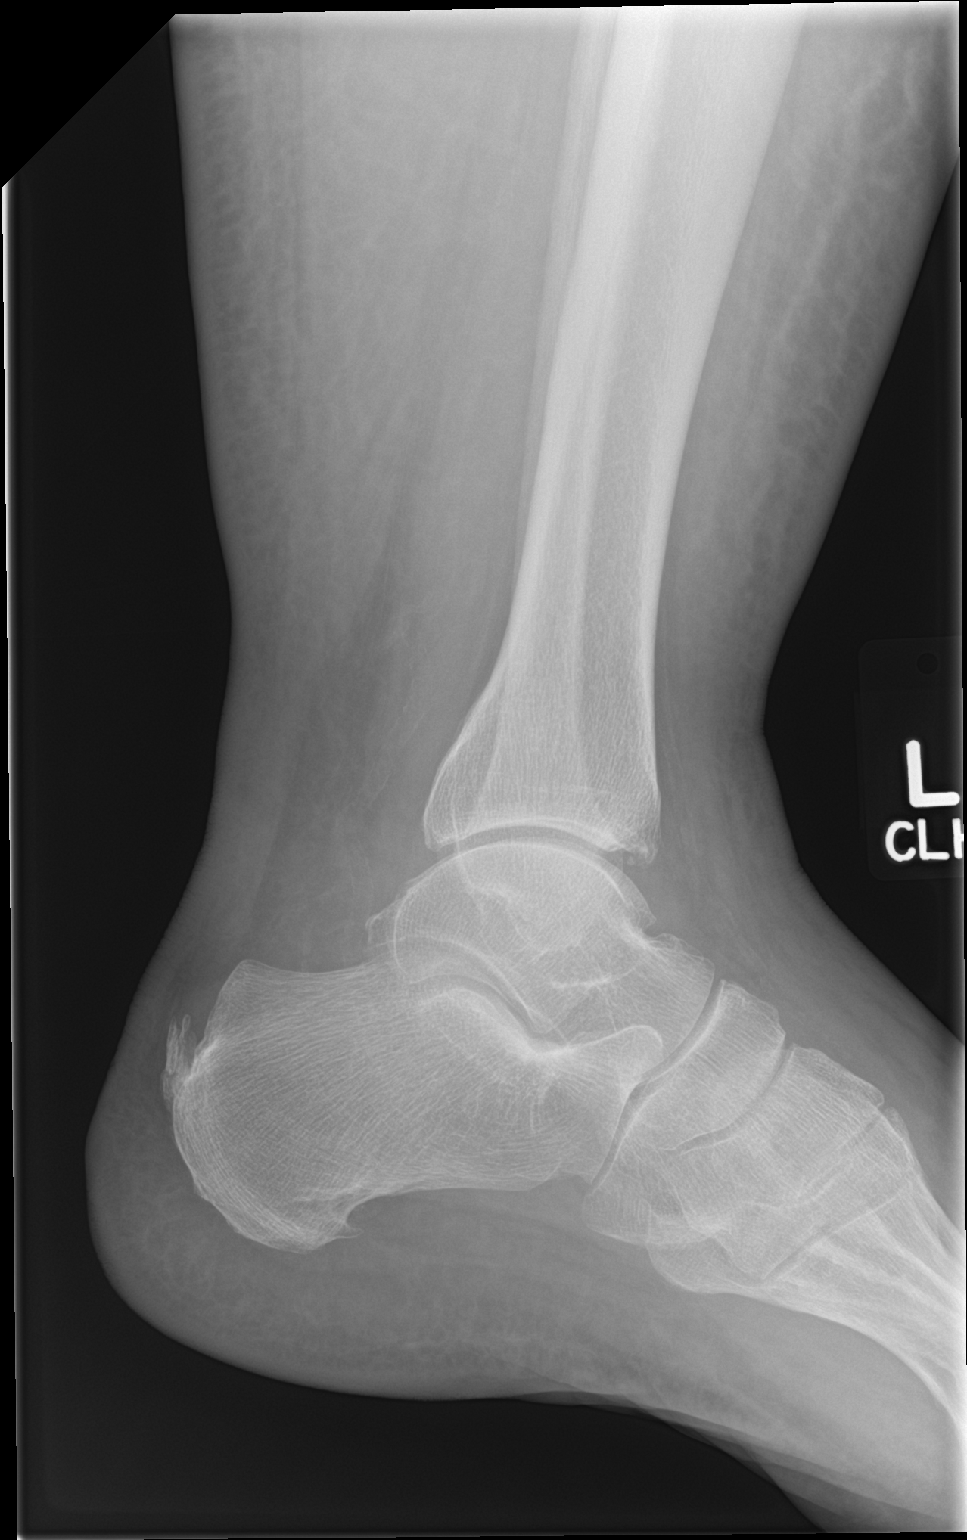

[3 of 3 positions shown; findings below may reference images not displayed]

FINDINGS: Marked soft tissue swelling about the ankle joint. Normal anatomic
alignment. The talar dome is intact. No evidence for acute fracture
or dislocation. Posterior and plantar calcaneal spurring.
IMPRESSION: Soft tissue swelling about the ankle.

No acute osseous abnormality.

## 2015-09-21 ENCOUNTER — Emergency Department (HOSPITAL_COMMUNITY)
Admission: EM | Admit: 2015-09-21 | Discharge: 2015-09-21 | Disposition: A | Discharge status: Home / Self Care | Payer: Medicare Other | Attending: Emergency Medicine | Admitting: Emergency Medicine

## 2015-09-21 ENCOUNTER — Emergency Department (HOSPITAL_COMMUNITY): Payer: Medicare Other

## 2015-09-21 ENCOUNTER — Encounter (HOSPITAL_COMMUNITY): Payer: Self-pay | Admitting: *Deleted

## 2015-09-21 DIAGNOSIS — Z79899 Other long term (current) drug therapy: Secondary | ICD-10-CM | POA: Insufficient documentation

## 2015-09-21 DIAGNOSIS — N200 Calculus of kidney: Secondary | ICD-10-CM | POA: Insufficient documentation

## 2015-09-21 DIAGNOSIS — M1990 Unspecified osteoarthritis, unspecified site: Secondary | ICD-10-CM | POA: Insufficient documentation

## 2015-09-21 DIAGNOSIS — Z880 Allergy status to penicillin: Secondary | ICD-10-CM | POA: Insufficient documentation

## 2015-09-21 DIAGNOSIS — Z7901 Long term (current) use of anticoagulants: Secondary | ICD-10-CM | POA: Diagnosis not present

## 2015-09-21 DIAGNOSIS — Z8659 Personal history of other mental and behavioral disorders: Secondary | ICD-10-CM | POA: Diagnosis not present

## 2015-09-21 DIAGNOSIS — I509 Heart failure, unspecified: Secondary | ICD-10-CM | POA: Diagnosis not present

## 2015-09-21 DIAGNOSIS — I4891 Unspecified atrial fibrillation: Secondary | ICD-10-CM | POA: Insufficient documentation

## 2015-09-21 DIAGNOSIS — R1032 Left lower quadrant pain: Secondary | ICD-10-CM | POA: Diagnosis present

## 2015-09-21 DIAGNOSIS — I119 Hypertensive heart disease without heart failure: Secondary | ICD-10-CM | POA: Diagnosis not present

## 2015-09-21 DIAGNOSIS — Z951 Presence of aortocoronary bypass graft: Secondary | ICD-10-CM | POA: Diagnosis not present

## 2015-09-21 DIAGNOSIS — Z9889 Other specified postprocedural states: Secondary | ICD-10-CM | POA: Diagnosis not present

## 2015-09-21 DIAGNOSIS — E669 Obesity, unspecified: Secondary | ICD-10-CM | POA: Diagnosis not present

## 2015-09-21 DIAGNOSIS — Z7951 Long term (current) use of inhaled steroids: Secondary | ICD-10-CM | POA: Insufficient documentation

## 2015-09-21 DIAGNOSIS — Z7982 Long term (current) use of aspirin: Secondary | ICD-10-CM | POA: Insufficient documentation

## 2015-09-21 DIAGNOSIS — Z8719 Personal history of other diseases of the digestive system: Secondary | ICD-10-CM | POA: Diagnosis not present

## 2015-09-21 LAB — URINALYSIS, ROUTINE W REFLEX MICROSCOPIC
Bilirubin Urine: NEGATIVE
Glucose, UA: NEGATIVE mg/dL
Ketones, ur: 15 mg/dL — AB
Leukocytes, UA: NEGATIVE
Nitrite: NEGATIVE
Protein, ur: 30 mg/dL — AB
Specific Gravity, Urine: 1.026 (ref 1.005–1.030)
Urobilinogen, UA: 0.2 mg/dL (ref 0.0–1.0)
pH: 5 (ref 5.0–8.0)

## 2015-09-21 LAB — COMPREHENSIVE METABOLIC PANEL WITH GFR
ALT: 21 U/L (ref 17–63)
AST: 26 U/L (ref 15–41)
Albumin: 3.6 g/dL (ref 3.5–5.0)
Alkaline Phosphatase: 58 U/L (ref 38–126)
Anion gap: 10 (ref 5–15)
BUN: 25 mg/dL — ABNORMAL HIGH (ref 6–20)
CO2: 23 mmol/L (ref 22–32)
Calcium: 9.2 mg/dL (ref 8.9–10.3)
Chloride: 106 mmol/L (ref 101–111)
Creatinine, Ser: 1.35 mg/dL — ABNORMAL HIGH (ref 0.61–1.24)
GFR calc Af Amer: >60 mL/min
GFR calc non Af Amer: 54 mL/min — ABNORMAL LOW
Glucose, Bld: 141 mg/dL — ABNORMAL HIGH (ref 65–99)
Potassium: 4 mmol/L (ref 3.5–5.1)
Sodium: 139 mmol/L (ref 135–145)
Total Bilirubin: 0.6 mg/dL (ref 0.3–1.2)
Total Protein: 6.9 g/dL (ref 6.5–8.1)

## 2015-09-21 LAB — CBC
HCT: 42.2 % (ref 39.0–52.0)
Hemoglobin: 14 g/dL (ref 13.0–17.0)
MCH: 29.5 pg (ref 26.0–34.0)
MCHC: 33.2 g/dL (ref 30.0–36.0)
MCV: 88.8 fL (ref 78.0–100.0)
Platelets: 220 K/uL (ref 150–400)
RBC: 4.75 MIL/uL (ref 4.22–5.81)
RDW: 14.8 % (ref 11.5–15.5)
WBC: 10.5 K/uL (ref 4.0–10.5)

## 2015-09-21 LAB — URINE MICROSCOPIC-ADD ON

## 2015-09-21 LAB — LIPASE, BLOOD: LIPASE: 25 U/L (ref 22–51)

## 2015-09-21 MED ORDER — SODIUM CHLORIDE 0.9 % IV BOLUS (SEPSIS)
500.0000 mL | Freq: Once | INTRAVENOUS | Status: AC
  Administered 2015-09-21: 500 mL via INTRAVENOUS

## 2015-09-21 MED ORDER — IOHEXOL 300 MG/ML  SOLN
25.0000 mL | Freq: Once | INTRAMUSCULAR | Status: AC | PRN
  Administered 2015-09-21: 25 mL via ORAL

## 2015-09-21 MED ORDER — ONDANSETRON 4 MG PO TBDP
4.0000 mg | ORAL_TABLET | Freq: Once | ORAL | Status: AC | PRN
  Administered 2015-09-21: 4 mg via ORAL

## 2015-09-21 MED ORDER — OXYCODONE-ACETAMINOPHEN 5-325 MG PO TABS: 1.0000 | ORAL_TABLET | ORAL | Status: DC | PRN

## 2015-09-21 MED ORDER — IOHEXOL 300 MG/ML  SOLN
100.0000 mL | Freq: Once | INTRAMUSCULAR | Status: AC | PRN
  Administered 2015-09-21: 100 mL via INTRAVENOUS

## 2015-09-21 MED ORDER — TAMSULOSIN HCL 0.4 MG PO CAPS: 0.4000 mg | ORAL_CAPSULE | Freq: Every day | ORAL | Status: DC

## 2015-09-21 MED ORDER — ONDANSETRON 4 MG PO TBDP: ORAL_TABLET | ORAL | Status: DC

## 2015-09-21 MED ORDER — ONDANSETRON 4 MG PO TBDP
ORAL_TABLET | ORAL | Status: AC
  Filled 2015-09-21: qty 1

## 2015-09-21 NOTE — ED Notes (Signed)
Pt attempting to give urine sample

## 2015-09-21 NOTE — Discharge Instructions (Signed)
Kidney Stones °Kidney stones (urolithiasis) are deposits that form inside your kidneys. The intense pain is caused by the stone moving through the urinary tract. When the stone moves, the ureter goes into spasm around the stone. The stone is usually passed in the urine.  °CAUSES  °· A disorder that makes certain neck glands produce too much parathyroid hormone (primary hyperparathyroidism). °· A buildup of uric acid crystals, similar to gout in your joints. °· Narrowing (stricture) of the ureter. °· A kidney obstruction present at birth (congenital obstruction). °· Previous surgery on the kidney or ureters. °· Numerous kidney infections. °SYMPTOMS  °· Feeling sick to your stomach (nauseous). °· Throwing up (vomiting). °· Blood in the urine (hematuria). °· Pain that usually spreads (radiates) to the groin. °· Frequency or urgency of urination. °DIAGNOSIS  °· Taking a history and physical exam. °· Blood or urine tests. °· CT scan. °· Occasionally, an examination of the inside of the urinary bladder (cystoscopy) is performed. °TREATMENT  °· Observation. °· Increasing your fluid intake. °· Extracorporeal shock wave lithotripsy--This is a noninvasive procedure that uses shock waves to break up kidney stones. °· Surgery may be needed if you have severe pain or persistent obstruction. There are various surgical procedures. Most of the procedures are performed with the use of small instruments. Only small incisions are needed to accommodate these instruments, so recovery time is minimized. °The size, location, and chemical composition are all important variables that will determine the proper choice of action for you. Talk to your health care provider to better understand your situation so that you will minimize the risk of injury to yourself and your kidney.  °HOME CARE INSTRUCTIONS  °· Drink enough water and fluids to keep your urine clear or pale yellow. This will help you to pass the stone or stone fragments. °· Strain  all urine through the provided strainer. Keep all particulate matter and stones for your health care provider to see. The stone causing the pain may be as small as a grain of salt. It is very important to use the strainer each and every time you pass your urine. The collection of your stone will allow your health care provider to analyze it and verify that a stone has actually passed. The stone analysis will often identify what you can do to reduce the incidence of recurrences. °· Only take over-the-counter or prescription medicines for pain, discomfort, or fever as directed by your health care provider. °· Keep all follow-up visits as told by your health care provider. This is important. °· Get follow-up X-rays if required. The absence of pain does not always mean that the stone has passed. It may have only stopped moving. If the urine remains completely obstructed, it can cause loss of kidney function or even complete destruction of the kidney. It is your responsibility to make sure X-rays and follow-ups are completed. Ultrasounds of the kidney can show blockages and the status of the kidney. Ultrasounds are not associated with any radiation and can be performed easily in a matter of minutes. °· Make changes to your daily diet as told by your health care provider. You may be told to: °¨ Limit the amount of salt that you eat. °¨ Eat 5 or more servings of fruits and vegetables each day. °¨ Limit the amount of meat, poultry, fish, and eggs that you eat. °· Collect a 24-hour urine sample as told by your health care provider. You may need to collect another urine sample every 6-12   months. °SEEK MEDICAL CARE IF: °· You experience pain that is progressive and unresponsive to any pain medicine you have been prescribed. °SEEK IMMEDIATE MEDICAL CARE IF:  °· Pain cannot be controlled with the prescribed medicine. °· You have a fever or shaking chills. °· The severity or intensity of pain increases over 18 hours and is not  relieved by pain medicine. °· You develop a new onset of abdominal pain. °· You feel faint or pass out. °· You are unable to urinate. °  °This information is not intended to replace advice given to you by your health care provider. Make sure you discuss any questions you have with your health care provider. °  °Document Released: 11/21/2005 Document Revised: 08/12/2015 Document Reviewed: 04/24/2013 °Elsevier Interactive Patient Education ©2016 [employer] ° °

## 2015-09-21 NOTE — ED Notes (Signed)
Pt reports waking up this am with n/v and LLQ pain. Denies diarrhea.

## 2015-09-21 NOTE — ED Provider Notes (Signed)
CSN: [id number]     Arrival date & time 09/21/15  1218 History   First MD Initiated Contact with Patient 09/21/15 1543     Chief Complaint  Patient presents with  . Abdominal Pain  . Emesis     (Consider location/radiation/quality/duration/timing/severity/associated sxs/prior Treatment) Patient is a 64 y.o. male presenting with abdominal pain.  Abdominal Pain Pain location:  LLQ Pain quality: sharp   Pain radiates to:  Does not radiate Pain severity:  Moderate Onset quality:  Gradual Duration:  1 day Timing:  Constant Progression:  Unchanged Chronicity:  New Relieved by:  Nothing Worsened by:  Nothing tried Ineffective treatments:  None tried Associated symptoms: nausea and vomiting   Associated symptoms: no anorexia, no diarrhea and no dysuria     Past Medical History  Diagnosis Date  . Hyperlipidemia   . Hypertensive heart disease   . Obesity (BMI 30-39.9)   . GERD (gastroesophageal reflux disease)   . Kidney stones   . Osteoarthritis   . A-fib (HCC)   . CHF (congestive heart failure) (HCC)   . PONV (postoperative nausea and vomiting)   . Hypertension   . Dysrhythmia     ATRIAL FIBRILATION  . Shortness of breath dyspnea   . Anxiety    Past Surgical History  Procedure Laterality Date  . Cataract extraction    . Cardioversion N/A 01/19/2015    Procedure: CARDIOVERSION;  Surgeon: Othella Boyer, MD;  Location: [facility] ENDOSCOPY;  Service: Cardiovascular;  Laterality: N/A;  pt in Afib, 12:22 synched cardioversion @  120 joules using Propofol 100 mg,IV....unsuccessful, repeated at 200 joules  . Cardioversion N/A 04/02/2015    Procedure: CARDIOVERSION;  Surgeon: Orpah Cobb, MD;  Location: [facility] ENDOSCOPY;  Service: Cardiovascular;  Laterality: N/A;  . Cardiac catheterization N/A 04/09/2015    Procedure: Right/Left Heart Cath and Coronary Angiography;  Surgeon: Orpah Cobb, MD;  Location: [facility] INVASIVE CV LAB CUPID;  Service: Cardiovascular;  Laterality: N/A;  . Coronary  artery bypass graft N/A 04/13/2015    Procedure: CORONARY ARTERY BYPASS GRAFTING (CABG);  Surgeon: Alleen Borne, MD;  Location: [facility] OR;  Service: Open Heart Surgery;  Laterality: N/A;  . Tee without cardioversion N/A 04/13/2015    Procedure: TRANSESOPHAGEAL ECHOCARDIOGRAM (TEE);  Surgeon: Alleen Borne, MD;  Location: [facility] OR;  Service: Open Heart Surgery;  Laterality: N/A;  . Clipping of atrial appendage  04/13/2015    Procedure: CLIPPING OF ATRIAL APPENDAGE;  Surgeon: Alleen Borne, MD;  Location: [facility] OR;  Service: Open Heart Surgery;;   Family History  Problem Relation Age of Onset  . Diabetes Mother   . Heart disease Father   . Stroke Brother   . Heart disease Brother   . Diabetes Brother    Social History  Substance Use Topics  . Smoking status: Former Games developer  . Smokeless tobacco: Never Used  . Alcohol Use: No    Review of Systems  Gastrointestinal: Positive for nausea, vomiting and abdominal pain. Negative for diarrhea and anorexia.  Genitourinary: Negative for dysuria.  All other systems reviewed and are negative.     Allergies  Niacin and related and Penicillins  Home Medications   Prior to Admission medications   Medication Sig Start Date End Date Taking? Authorizing Provider  acetaminophen (TYLENOL) 500 MG tablet Take 1,000 mg by mouth every 6 (six) hours as needed for moderate pain.   Yes Historical Provider, MD  allopurinol (ZYLOPRIM) 100 MG tablet Take 100 mg by mouth daily.  Yes Historical Provider, MD  amiodarone (PACERONE) 200 MG tablet Take 1 tablet (200 mg total) by mouth daily. 04/20/15  Yes Donielle Margaretann Loveless, PA-C  aspirin 81 MG EC tablet Take 1 tablet (81 mg total) by mouth daily. Swallow whole. Patient taking differently: Take 81 mg by mouth daily as needed for pain. Swallow whole. 09/03/12  Yes [name], MD  carvedilol (COREG) 6.25 MG tablet Take 1 tablet (6.25 mg total) by mouth 2 (two) times daily with a meal. 04/20/15  Yes Donielle Margaretann Loveless, PA-C  fluticasone (FLONASE) 50 MCG/ACT nasal spray Place 1 spray into both nostrils daily.   Yes Historical Provider, MD  furosemide (LASIX) 40 MG tablet Take 1 tablet (40 mg total) by mouth daily. Patient taking differently: Take 40 mg by mouth 2 (two) times daily.  04/20/15  Yes Donielle Margaretann Loveless, PA-C  lisinopril (PRINIVIL,ZESTRIL) 2.5 MG tablet Take 1 tablet (2.5 mg total) by mouth daily. 01/20/15  Yes Leroy Sea, MD  potassium chloride SA (K-DUR,KLOR-CON) 20 MEQ tablet Take 1 tablet (20 mEq total) by mouth daily. 04/20/15  Yes Donielle Margaretann Loveless, PA-C  pravastatin (PRAVACHOL) 40 MG tablet Take 1 tablet (40 mg total) by mouth daily. 12/22/14  Yes Brittainy Sherlynn Carbon, PA-C  rivaroxaban (XARELTO) 20 MG TABS tablet Take 1 tablet (20 mg total) by mouth daily with supper. 12/22/14  Yes Brittainy Sherlynn Carbon, PA-C  ondansetron (ZOFRAN ODT) 4 MG disintegrating tablet  ODT q4 hours prn nausea/vomit 09/21/15   Mirian Mo, MD  oxyCODONE-acetaminophen (PERCOCET/ROXICET) 5-325 MG tablet Take 1-2 tablets by mouth every 4 (four) hours as needed for severe pain. 09/21/15   Mirian Mo, MD  tamsulosin (FLOMAX) 0.4 MG CAPS capsule Take 1 capsule (0.4 mg total) by mouth daily. 09/21/15   Mirian Mo, MD   BP 136/80 mmHg  Pulse 89  Temp(Src) 98.1 F (36.7 C) (Oral)  Resp 18  Ht 5' 10.5" (1.791 m)  Wt 320 lb (145.151 kg)  BMI 45.25 kg/m2  SpO2 95% Physical Exam  Constitutional: He is oriented to person, place, and time. He appears well-developed and well-nourished.  HENT:  Head: Normocephalic and atraumatic.  Eyes: Conjunctivae and EOM are normal.  Neck: Normal range of motion. Neck supple.  Cardiovascular: Normal rate, regular rhythm and normal heart sounds.   Pulmonary/Chest: Effort normal and breath sounds normal. No respiratory distress.  Abdominal: He exhibits no distension. There is tenderness in the left lower quadrant. There is no rebound and no guarding. Hernia  confirmed negative in the right inguinal area and confirmed negative in the left inguinal area.  Genitourinary: Right testis shows no tenderness. Left testis shows no tenderness.  Musculoskeletal: Normal range of motion.  Lymphadenopathy:       Right: No inguinal adenopathy present.       Left: No inguinal adenopathy present.  Neurological: He is alert and oriented to person, place, and time.  Skin: Skin is warm and dry.  Vitals reviewed.   ED Course  Procedures (including critical care time) Labs Review Labs Reviewed  COMPREHENSIVE METABOLIC PANEL - Abnormal; Notable for the following:    Glucose, Bld 141 (*)    BUN 25 (*)    Creatinine, Ser 1.35 (*)    GFR calc non Af Amer 54 (*)    All other components within normal limits  URINALYSIS, ROUTINE W REFLEX MICROSCOPIC (NOT AT [facility]) - Abnormal; Notable for the following:    APPearance CLOUDY (*)    Hgb urine dipstick  LARGE (*)    Ketones, ur 15 (*)    Protein, ur 30 (*)    All other components within normal limits  URINE MICROSCOPIC-ADD ON - Abnormal; Notable for the following:    Casts HYALINE CASTS (*)    All other components within normal limits  LIPASE, BLOOD  CBC    Imaging Review Ct Abdomen Pelvis W Contrast  09/21/2015  CLINICAL DATA:  Nausea, vomiting and LEFT lower quadrant pain beginning earlier today, LEFT lower quadrant tenderness, history hypertension, CHF, hypertensive heart disease, obesity, hyperlipidemia, former smoker EXAM: CT ABDOMEN AND PELVIS WITH CONTRAST TECHNIQUE: Multidetector CT imaging of the abdomen and pelvis was performed using the standard protocol following bolus administration of intravenous contrast. Sagittal and coronal MPR images reconstructed from axial data set. CONTRAST:  OMNIPAQUE IOHEXOL 300 MG/ML SOLN IV. Dilute oral contrast. COMPARISON:  None FINDINGS: Lung bases clear. Scattered atherosclerotic calcifications aorta and coronary arteries. LEFT hydronephrosis secondary to a 5 mm mid  LEFT ureteral calculus image 64. Additional nonobstructing BILATERAL renal calculi largest 12 mm diameter mid LEFT kidney. LEFT perinephric and periureteral edema. RIGHT renal cysts largest 5.4 x 4.1 cm at inferior pole. Calcified splenic granulomata. Liver, gallbladder, spleen, pancreas, and adrenal glands otherwise unremarkable. Moderate-sized umbilical hernia containing fat, fascial defect 3.6 x 3.5 cm. Normal appendix. Diverticulosis of descending and sigmoid colon without evidence of diverticulitis. No mass, adenopathy, free air or free fluid. Bones demineralized. LEFT inguinal hernia containing fat. IMPRESSION: LEFT hydronephrosis and proximal ureteral dilatation secondary to a 5 mm mid LEFT ureteral calculus. Additional BILATERAL nonobstructing renal calculi and RIGHT renal cysts. Distal colonic diverticulosis without evidence of diverticulitis. Umbilical and LEFT inguinal hernias containing fat. Electronically Signed   By: Ulyses Southward M.D.   On: 09/21/2015 19:24   I have personally reviewed and evaluated these images and lab results as part of my medical decision-making.   EKG Interpretation None      MDM   Final diagnoses:  Nephrolithiasis   64 y.o. male with pertinent PMH of HTN, HLD, anxiety, afib (on xarelto), chf presents with LLQ abd pain as above.  Atraumatic, has had n/v but no change in bm or fevers.  Exam as above with tenderness of LLQ just under pannus, but no other abnormalities.    Dorna Bloom revealed recurrent nephrolithiasis.  Pt has always passed these spontaneously.  Symptoms relieved in department.  DC home to fu with urology.  I have reviewed all laboratory and imaging studies if ordered as above  1. Nephrolithiasis         Mirian Mo, MD 09/21/15 [phone]

## 2015-09-21 NOTE — ED Notes (Signed)
Pt drank all of his contrast. Notified Ct

## 2015-09-24 ENCOUNTER — Encounter: Payer: Non-veteran care | Admitting: Internal Medicine

## 2015-09-25 ENCOUNTER — Encounter: Payer: Self-pay | Admitting: Internal Medicine

## 2015-09-25 ENCOUNTER — Ambulatory Visit (INDEPENDENT_AMBULATORY_CARE_PROVIDER_SITE_OTHER): Payer: Medicare Other | Admitting: Internal Medicine

## 2015-09-25 VITALS — BP 120/74 | HR 68 | Temp 98.3°F | Resp 20 | Ht 70.0 in | Wt 322.0 lb

## 2015-09-25 DIAGNOSIS — Z0000 Encounter for general adult medical examination without abnormal findings: Secondary | ICD-10-CM | POA: Diagnosis not present

## 2015-09-25 DIAGNOSIS — I480 Paroxysmal atrial fibrillation: Secondary | ICD-10-CM | POA: Diagnosis not present

## 2015-09-25 DIAGNOSIS — E785 Hyperlipidemia, unspecified: Secondary | ICD-10-CM | POA: Diagnosis not present

## 2015-09-25 DIAGNOSIS — I5023 Acute on chronic systolic (congestive) heart failure: Secondary | ICD-10-CM

## 2015-09-25 DIAGNOSIS — I5022 Chronic systolic (congestive) heart failure: Secondary | ICD-10-CM

## 2015-09-25 DIAGNOSIS — Z951 Presence of aortocoronary bypass graft: Secondary | ICD-10-CM

## 2015-09-25 MED ORDER — POTASSIUM CHLORIDE CRYS ER 20 MEQ PO TBCR: 20.0000 meq | EXTENDED_RELEASE_TABLET | Freq: Every day | ORAL | Status: DC

## 2015-09-25 MED ORDER — RIVAROXABAN 20 MG PO TABS: 20.0000 mg | ORAL_TABLET | Freq: Every day | ORAL | Status: DC

## 2015-09-25 MED ORDER — LISINOPRIL 2.5 MG PO TABS: 2.5000 mg | ORAL_TABLET | Freq: Every day | ORAL | Status: DC

## 2015-09-25 MED ORDER — CLOTRIMAZOLE 1 % EX CREA: 1.0000 "application " | TOPICAL_CREAM | Freq: Two times a day (BID) | CUTANEOUS | Status: DC

## 2015-09-25 MED ORDER — PRAVASTATIN SODIUM 40 MG PO TABS: 40.0000 mg | ORAL_TABLET | Freq: Every day | ORAL | Status: DC

## 2015-09-25 MED ORDER — ALBUTEROL SULFATE HFA 108 (90 BASE) MCG/ACT IN AERS: 1.0000 | INHALATION_SPRAY | Freq: Four times a day (QID) | RESPIRATORY_TRACT | Status: DC | PRN

## 2015-09-25 MED ORDER — FUROSEMIDE 40 MG PO TABS: 40.0000 mg | ORAL_TABLET | Freq: Two times a day (BID) | ORAL | Status: DC

## 2015-09-25 MED ORDER — CARVEDILOL 6.25 MG PO TABS: 6.2500 mg | ORAL_TABLET | Freq: Two times a day (BID) | ORAL | Status: DC

## 2015-09-25 MED ORDER — ALLOPURINOL 100 MG PO TABS: 100.0000 mg | ORAL_TABLET | Freq: Every day | ORAL | Status: DC

## 2015-09-25 MED ORDER — AMIODARONE HCL 200 MG PO TABS: 200.0000 mg | ORAL_TABLET | Freq: Every day | ORAL | Status: DC

## 2015-09-25 NOTE — Patient Instructions (Signed)
Limit your sodium (Salt) intake  You need to lose weight.  Consider a lower calorie diet and regular exercise.    It is important that you exercise regularly, at least 20 minutes 3 to 4 times per week.  If you develop chest pain or shortness of breath seek  medical attention.  Schedule your colonoscopy to help detect colon cancer.  Please see your eye doctor yearly to check for  eye damage  Return in one year for follow-up  Follow-up cardiology  Fat and Cholesterol Restricted Diet High levels of fat and cholesterol in your blood may lead to various health problems, such as diseases of the heart, blood vessels, gallbladder, liver, and pancreas. Fats are concentrated sources of energy that come in various forms. Certain types of fat, including saturated fat, may be harmful in excess. Cholesterol is a substance needed by your body in small amounts. Your body makes all the cholesterol it needs. Excess cholesterol comes from the food you eat. When you have high levels of cholesterol and saturated fat in your blood, health problems can develop because the excess fat and cholesterol will gather along the walls of your blood vessels, causing them to narrow. Choosing the right foods will help you control your intake of fat and cholesterol. This will help keep the levels of these substances in your blood within normal limits and reduce your risk of disease. WHAT IS MY PLAN? Your health care provider recommends that you:  Get no more than __________ % of the total calories in your daily diet from fat.  Limit your intake of saturated fat to less than ______% of your total calories each day.  Limit the amount of cholesterol in your diet to less than _________mg per day. WHAT TYPES OF FAT SHOULD I CHOOSE?  Choose healthy fats more often. Choose monounsaturated and polyunsaturated fats, such as olive and canola oil, flaxseeds, walnuts, almonds, and seeds.  Eat more omega-3 fats. Good choices include  salmon, mackerel, sardines, tuna, flaxseed oil, and ground flaxseeds. Aim to eat fish at least two times a week.  Limit saturated fats. Saturated fats are primarily found in animal products, such as meats, butter, and cream. Plant sources of saturated fats include palm oil, palm kernel oil, and coconut oil.  Avoid foods with partially hydrogenated oils in them. These contain trans fats. Examples of foods that contain trans fats are stick margarine, some tub margarines, cookies, crackers, and other baked goods. WHAT GENERAL GUIDELINES DO I NEED TO FOLLOW? These guidelines for healthy eating will help you control your intake of fat and cholesterol:  Check food labels carefully to identify foods with trans fats or high amounts of saturated fat.  Fill one half of your plate with vegetables and green salads.  Fill one fourth of your plate with whole grains. Look for the word "whole" as the first word in the ingredient list.  Fill one fourth of your plate with lean protein foods.  Limit fruit to two servings a day. Choose fruit instead of juice.  Eat more foods that contain soluble fiber. Examples of foods that contain this type of fiber are apples, broccoli, carrots, beans, peas, and barley. Aim to get 20-30 g of fiber per day.  Eat more home-cooked food and less restaurant, buffet, and fast food.  Limit or avoid alcohol.  Limit foods high in starch and sugar.  Limit fried foods.  Cook foods using methods other than frying. Baking, boiling, grilling, and broiling are all great options.  Lose weight if you are overweight. Losing just 5-10% of your initial body weight can help your overall health and prevent diseases such as diabetes and heart disease. WHAT FOODS CAN I EAT? Grains Whole grains, such as whole wheat or whole grain breads, crackers, cereals, and pasta. Unsweetened oatmeal, bulgur, barley, quinoa, or brown rice. Corn or whole wheat flour tortillas. Vegetables Fresh or frozen  vegetables (raw, steamed, roasted, or grilled). Green salads. Fruits All fresh, canned (in natural juice), or frozen fruits. Meat and Other Protein Products Ground beef (85% or leaner), grass-fed beef, or beef trimmed of fat. Skinless chicken or [place]. Ground chicken or [place]. Pork trimmed of fat. All fish and seafood. Eggs. Dried beans, peas, or lentils. Unsalted nuts or seeds. Unsalted canned or dry beans. Dairy Low-fat dairy products, such as skim or 1% milk, 2% or reduced-fat cheeses, low-fat ricotta or cottage cheese, or plain low-fat yogurt. Fats and Oils Tub margarines without trans fats. Light or reduced-fat mayonnaise and salad dressings. Avocado. Olive, canola, sesame, or safflower oils. Natural peanut or almond butter (choose ones without added sugar and oil). The items listed above may not be a complete list of recommended foods or beverages. Contact your dietitian for more options. WHAT FOODS ARE NOT RECOMMENDED? Grains White bread. White pasta. White rice. Cornbread. Bagels, pastries, and croissants. Crackers that contain trans fat. Vegetables White potatoes. Corn. Creamed or fried vegetables. Vegetables in a cheese sauce. Fruits Dried fruits. Canned fruit in light or heavy syrup. Fruit juice. Meat and Other Protein Products Fatty cuts of meat. Ribs, chicken wings, bacon, sausage, bologna, salami, chitterlings, fatback, hot dogs, bratwurst, and packaged luncheon meats. Liver and organ meats. Dairy Whole or 2% milk, cream, half-and-half, and cream cheese. Whole milk cheeses. Whole-fat or sweetened yogurt. Full-fat cheeses. Nondairy creamers and whipped toppings. Processed cheese, cheese spreads, or cheese curds. Sweets and Desserts Corn syrup, sugars, honey, and molasses. Candy. Jam and jelly. Syrup. Sweetened cereals. Cookies, pies, cakes, donuts, muffins, and ice cream. Fats and Oils Butter, stick margarine, lard, shortening, ghee, or bacon fat. Coconut, palm kernel, or palm  oils. Beverages Alcohol. Sweetened drinks (such as sodas, lemonade, and fruit drinks or punches). The items listed above may not be a complete list of foods and beverages to avoid. Contact your dietitian for more information.   This information is not intended to replace advice given to you by your health care provider. Make sure you discuss any questions you have with your health care provider.   Document Released: 11/21/2005 Document Revised: 12/12/2014 Document Reviewed: 02/19/2014 [employer] Interactive Patient Education [employer]

## 2015-09-25 NOTE — Progress Notes (Signed)
Subjective:    Patient ID: Charles Mckinney, male    DOB: 02/10/1951, 64 y.o.   MRN: [id number]  HPI   Subjective:    Patient ID: Charles Mckinney, male    DOB: Apr 22, 1951, 64 y.o.   MRN: [id number]  HPI  46 -year-old patient who is seen today for a preventative health examination.  He established with this practice 4 years ago, but has not been seen in follow-up.  He is followed at the [facility] system.  He has a history of hypertension and dyslipidemia. 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  He was hospitalized in the spring and is status post CABG.  He is followed by cardiology in [facility].   He was seen in the ED recently for symptomatic kidney stones.  He remains on anticoagulation for atrial fibrillation   Past Medical History  Diagnosis Date  . Hyperlipidemia   . Hypertensive heart disease   . Obesity (BMI 30-39.9)   . GERD (gastroesophageal reflux disease)   . Kidney stones   . Osteoarthritis   . A-fib (HCC)   . CHF (congestive heart failure) (HCC)   . PONV (postoperative nausea and vomiting)   . Hypertension   . Dysrhythmia     ATRIAL FIBRILATION  . Shortness of breath dyspnea   . Anxiety     Social History   Social History  . Marital Status: Widowed    Spouse Name: N/A  . Number of Children: N/A  . Years of Education: N/A   Occupational History  . Not on file.   Social History Main Topics  . Smoking status: Former Games developer  . Smokeless tobacco: Never Used  . Alcohol Use: No  . Drug Use: No  . Sexual Activity: Yes   Other Topics Concern  . Not on file   Social History Narrative    Past Surgical History  Procedure Laterality Date  . Cataract extraction    . Cardioversion N/A 01/19/2015    Procedure: CARDIOVERSION;  Surgeon: Othella Boyer, MD;  Location: [facility] ENDOSCOPY;  Service: Cardiovascular;  Laterality: N/A;  pt in Afib, 12:22 synched cardioversion @  120 joules using Propofol 100 mg,IV....unsuccessful, repeated at 200  joules  . Cardioversion N/A 04/02/2015    Procedure: CARDIOVERSION;  Surgeon: Orpah Cobb, MD;  Location: [facility] ENDOSCOPY;  Service: Cardiovascular;  Laterality: N/A;  . Cardiac catheterization N/A 04/09/2015    Procedure: Right/Left Heart Cath and Coronary Angiography;  Surgeon: Orpah Cobb, MD;  Location: [facility] INVASIVE CV LAB CUPID;  Service: Cardiovascular;  Laterality: N/A;  . Coronary artery bypass graft N/A 04/13/2015    Procedure: CORONARY ARTERY BYPASS GRAFTING (CABG);  Surgeon: Alleen Borne, MD;  Location: [facility] OR;  Service: Open Heart Surgery;  Laterality: N/A;  . Tee without cardioversion N/A 04/13/2015    Procedure: TRANSESOPHAGEAL ECHOCARDIOGRAM (TEE);  Surgeon: Alleen Borne, MD;  Location: [facility] OR;  Service: Open Heart Surgery;  Laterality: N/A;  . Clipping of atrial appendage  04/13/2015    Procedure: CLIPPING OF ATRIAL APPENDAGE;  Surgeon: Alleen Borne, MD;  Location: [facility] OR;  Service: Open Heart Surgery;;    Family History  Problem Relation Age of Onset  . Diabetes Mother   . Heart disease Father   . Stroke Brother   . Heart disease Brother   . Diabetes Brother     Allergies  Allergen Reactions  . Niacin And Related Other (See Comments)    FLUSHING  .  Penicillins Itching and Rash    Current Outpatient Prescriptions on File Prior to Visit  Medication Sig Dispense Refill  . acetaminophen (TYLENOL) 500 MG tablet Take 1,000 mg by mouth every 6 (six) hours as needed for moderate pain.    Marland Kitchen aspirin 81 MG EC tablet Take 1 tablet (81 mg total) by mouth daily. Swallow whole. (Patient taking differently: Take 81 mg by mouth daily as needed for pain. Swallow whole.) 30 tablet 12  . fluticasone (FLONASE) 50 MCG/ACT nasal spray Place 1 spray into both nostrils daily.    . ondansetron (ZOFRAN ODT) 4 MG disintegrating tablet  ODT q4 hours prn nausea/vomit 15 tablet 0  . oxyCODONE-acetaminophen (PERCOCET/ROXICET) 5-325 MG tablet Take 1-2 tablets by mouth every 4 (four) hours as needed for  severe pain. 15 tablet 0  . tamsulosin (FLOMAX) 0.4 MG CAPS capsule Take 1 capsule (0.4 mg total) by mouth daily. 30 capsule 0   No current facility-administered medications on file prior to visit.    BP 120/74 mmHg  Pulse 68  Temp(Src) 98.3 F (36.8 C) (Oral)  Resp 20  Ht  (1.778 m)  Wt 322 lb (146.058 kg)  BMI 46.20 kg/m2  SpO2 95%  1. Risk factors, based on past  M,S,F history.  Patient has known coronary artery disease.  Cardiovascular risk factors include hypertension and dyslipidemia  2.  Physical activities: Limited due to morbid obesity and chronic low back pain  3.  Depression/mood: No history of major depression  4.  Hearing: No major deficits  5.  ADL's: Independent in all aspects of daily living  6.  Fall risk: Moderate due to obesity   7.  Home safety: No problems identified  8.  Height weight, and visual acuity; height and weight stable no change in visual acuity   9.  Counseling: Heart healthy diet, weight loss encouraged  10. Lab orders based on risk factors: Patient will have follow-up lipid profile with cardiology  11. Referral : Follow-up cardiology and cardiothoracic surgery  12. Care plan: Heart healthy diet, weight loss encouraged.  Will continue efforts at aggressive risk factor modification  13. Cognitive assessment: Alert and oriented with normal affect.  No cognitive dysfunction  14. Screening: Patient provided with a written and personalized 5-10 year screening schedule in the AVS.  patient is reluctant to have a colonoscopy due to cost concerns annual clinical exams.  Encouraged.  Will follow-up closely with cardiology  15. 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 Hyperlipidemia   . Hypertensive heart disease   . Obesity (BMI 30-39.9)   . GERD (gastroesophageal reflux disease)   . Kidney stones   . Osteoarthritis   . A-fib (HCC)   . CHF (congestive heart failure) (HCC)   . PONV (postoperative nausea and vomiting)   . Hypertension   . Dysrhythmia     ATRIAL FIBRILATION  . Shortness of breath dyspnea   . Anxiety     Social History  Social History  . Marital Status: Widowed    Spouse Name: N/A  . Number of Children: N/A  . Years of Education: N/A   Occupational History  . Not on file.   Social History Main Topics  . Smoking status: Former Games developer  . Smokeless tobacco: Never Used  . Alcohol Use: No  . Drug Use: No  . Sexual Activity: Yes   Other Topics Concern  . Not on file   Social History Narrative    Past Surgical History  Procedure Laterality Date  . Cataract extraction    . Cardioversion N/A 01/19/2015    Procedure: CARDIOVERSION;  Surgeon: Othella Boyer, MD;  Location: [facility] ENDOSCOPY;  Service: Cardiovascular;  Laterality: N/A;  pt in Afib, 12:22 synched cardioversion @  120 joules using Propofol 100 mg,IV....unsuccessful, repeated at 200 joules  . Cardioversion N/A 04/02/2015    Procedure: CARDIOVERSION;  Surgeon: Orpah Cobb, MD;  Location: [facility] ENDOSCOPY;  Service: Cardiovascular;  Laterality: N/A;  . Cardiac catheterization N/A 04/09/2015    Procedure: Right/Left Heart Cath and Coronary Angiography;  Surgeon: Orpah Cobb, MD;  Location: [facility] INVASIVE CV LAB CUPID;  Service: Cardiovascular;  Laterality: N/A;  . Coronary artery bypass graft N/A 04/13/2015    Procedure: CORONARY ARTERY BYPASS GRAFTING (CABG);  Surgeon: Alleen Borne, MD;  Location: [facility] OR;  Service: Open Heart  Surgery;  Laterality: N/A;  . Tee without cardioversion N/A 04/13/2015    Procedure: TRANSESOPHAGEAL ECHOCARDIOGRAM (TEE);  Surgeon: Alleen Borne, MD;  Location: [facility] OR;  Service: Open Heart Surgery;  Laterality: N/A;  . Clipping of atrial appendage  04/13/2015    Procedure: CLIPPING OF ATRIAL APPENDAGE;  Surgeon: Alleen Borne, MD;  Location: [facility] OR;  Service: Open Heart Surgery;;    Family History  Problem Relation Age of Onset  . Diabetes Mother   . Heart disease Father   . Stroke Brother   . Heart disease Brother   . Diabetes Brother     Allergies  Allergen Reactions  . Niacin And Related Other (See Comments)    FLUSHING  . Penicillins Itching and Rash    Current Outpatient Prescriptions on File Prior to Visit  Medication Sig Dispense Refill  . acetaminophen (TYLENOL) 500 MG tablet Take 1,000 mg by mouth every 6 (six) hours as needed for moderate pain.    Marland Kitchen aspirin 81 MG EC tablet Take 1 tablet (81 mg total) by mouth daily. Swallow whole. (Patient taking differently: Take 81 mg by mouth daily as needed for pain. Swallow whole.) 30 tablet 12  . fluticasone (FLONASE) 50 MCG/ACT nasal spray Place 1 spray into both nostrils daily.    . ondansetron (ZOFRAN ODT) 4 MG disintegrating tablet  ODT q4 hours prn nausea/vomit 15 tablet 0  . oxyCODONE-acetaminophen (PERCOCET/ROXICET) 5-325 MG tablet Take 1-2 tablets by mouth every 4 (four) hours as needed for severe pain. 15 tablet 0  . tamsulosin (FLOMAX) 0.4 MG CAPS capsule Take 1 capsule (0.4 mg total) by mouth daily. 30 capsule 0   No current facility-administered medications on file prior to visit.    BP 120/74 mmHg  Pulse 68  Temp(Src) 98.3 F (36.8 C) (Oral)  Resp 20  Ht  (1.778 m)  Wt 322 lb (146.058 kg)  BMI 46.20 kg/m2  SpO2 95%       Review of Systems  Constitutional: Negative for fever, chills, activity change, appetite change and fatigue.  HENT: Negative for hearing loss, ear pain, congestion, rhinorrhea,  sneezing,  mouth sores, trouble swallowing, neck pain, neck stiffness, dental problem, voice change, sinus pressure and tinnitus.   Eyes: Negative for photophobia, pain, redness and visual disturbance.  Respiratory: Negative for apnea, cough, choking, chest tightness, shortness of breath and wheezing.   Cardiovascular: Negative for chest pain, palpitations and leg swelling.  Gastrointestinal: Negative for nausea, vomiting, abdominal pain, diarrhea, constipation, blood in stool, abdominal distention, anal bleeding and rectal pain.  Genitourinary: Negative for dysuria, urgency, frequency, hematuria, flank pain, decreased urine volume, discharge, penile swelling, scrotal swelling, difficulty urinating, genital sores and testicular pain.  Musculoskeletal: Negative for myalgias, back pain, joint swelling, arthralgias and gait problem.  Skin: Negative for color change, rash and wound.  Neurological: Negative for dizziness, tremors, seizures, syncope, facial asymmetry, speech difficulty, weakness, light-headedness, numbness and headaches.  Hematological: Negative for adenopathy. Does not bruise/bleed easily.  Psychiatric/Behavioral: Negative for suicidal ideas, hallucinations, behavioral problems, confusion, disturbed wake/sleep cycle, self-injury, dysphoric mood, decreased concentration and agitation. The patient is not nervous/anxious.        Objective:   Physical Exam  Constitutional: He appears well-developed and well-nourished.       Weight 322  Blood pressure 120 over 74 HENT:  Head: Normocephalic and atraumatic.  Right Ear: External ear normal.  Left Ear: External ear normal.  Nose: Nose normal.  Mouth/Throat: Oropharynx is clear and moist.       Missing dentition  Eyes: Conjunctivae normal and EOM are normal. Pupils are equal, round, and reactive to light. No scleral icterus.  Neck: Normal range of motion. Neck supple. No JVD present. No thyromegaly present.  Cardiovascular: Regular  rhythm, normal heart sounds and intact distal pulses.  Exam reveals no gallop and no friction rub.   No murmur heard. Pulmonary/Chest: Effort normal and breath sounds normal. He exhibits no tenderness.  Abdominal: Soft. Bowel sounds are normal. He exhibits no distension and no mass. There is no tenderness.       Umbilical hernia  Genitourinary: Prostate normal and penis normal.  Musculoskeletal: Normal range of motion. He exhibits edema. He exhibits no tenderness.       1+ edema  Lymphadenopathy:    He has no cervical adenopathy.  Neurological: He is alert. He has normal reflexes. No cranial nerve deficit. Coordination normal.  Skin: Skin is warm and dry. No rash noted.       Onychomycotic toenail changes  Psychiatric: He has a normal mood and affect. His behavior is normal.          Assessment & Plan:   Preventive health examination Dyslipidemia Hypertension Exogenous obesity Gastroesophageal reflux disease Coronary artery disease status post prior MI.  Status post CABG Chronic systolic heart failure Paroxysmal atrial fibrillation  Weight loss encouraged Medical records will be obtained Recheck in 6 months with updated lab No change in medical therapy Follow-up cardiology, who will check lipid profile  Screening colonoscopy.  Encouraged  Review of Systems    as above Objective:   Physical Exam   As above     Assessment & Plan:    As above

## 2015-09-25 NOTE — Progress Notes (Signed)
Pre visit review using our clinic review tool, if applicable. No additional management support is needed unless otherwise documented below in the visit note. 

## 2015-12-31 ENCOUNTER — Ambulatory Visit: Payer: Medicare Other | Admitting: Adult Health

## 2016-02-02 DIAGNOSIS — I25119 Atherosclerotic heart disease of native coronary artery with unspecified angina pectoris: Secondary | ICD-10-CM | POA: Insufficient documentation

## 2016-02-02 HISTORY — DX: Atherosclerotic heart disease of native coronary artery with unspecified angina pectoris: I25.119

## 2016-02-04 ENCOUNTER — Other Ambulatory Visit: Payer: Self-pay | Admitting: Cardiology

## 2016-02-04 NOTE — Telephone Encounter (Signed)
REFILL 

## 2016-03-11 ENCOUNTER — Ambulatory Visit (INDEPENDENT_AMBULATORY_CARE_PROVIDER_SITE_OTHER): Payer: Medicare Other | Admitting: Family Medicine

## 2016-03-11 ENCOUNTER — Encounter: Payer: Self-pay | Admitting: Family Medicine

## 2016-03-11 VITALS — BP 130/72 | Temp 98.9°F | Wt 315.0 lb

## 2016-03-11 DIAGNOSIS — B029 Zoster without complications: Secondary | ICD-10-CM

## 2016-03-11 MED ORDER — VALACYCLOVIR HCL 1 G PO TABS: 1000.0000 mg | ORAL_TABLET | Freq: Three times a day (TID) | ORAL | Status: DC

## 2016-03-11 MED ORDER — TRAMADOL HCL 50 MG PO TABS
50.0000 mg | ORAL_TABLET | Freq: Three times a day (TID) | ORAL | Status: DC | PRN
Start: 2016-03-11 — End: 2016-12-13

## 2016-03-11 NOTE — Progress Notes (Signed)
Tana Conch, MD  Subjective:  Charles Mckinney is a 65 y.o. year old very pleasant male patient who presents for/with See problem oriented charting ROS- see ros below  Past Medical History-  Patient Active Problem List   Diagnosis Date Noted  . 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 Acute on chronic systolic heart failure (HCC) 04/05/2015  . Acute systolic heart failure (HCC) 03/31/2015  . Acute on chronic systolic congestive heart failure (HCC) 01/19/2015  . Chronic systolic heart failure (HCC) 01/19/2015  . 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 Long-term (current) use of anticoagulants 01/19/2015  . Paroxysmal atrial fibrillation (HCC)   . GERD 11/22/2007  . Hyperlipidemia   . Morbid obesity (HCC)   . Hypertensive heart disease     Medications- reviewed and updated Current Outpatient Prescriptions  Medication Sig Dispense Refill  . allopurinol (ZYLOPRIM) 100 MG tablet Take 1 tablet (100 mg total) by mouth daily. 90 tablet 3  . amiodarone (PACERONE) 200 MG tablet Take 1 tablet (200 mg total) by mouth daily. 90 tablet 3  . aspirin 81 MG EC tablet Take 1 tablet (81 mg total) by mouth daily. Swallow whole. (Patient taking differently: Take 81 mg by mouth daily as needed for pain. Swallow whole.) 30 tablet 12  . carvedilol (COREG) 6.25 MG tablet TAKE ONE TABLET BY MOUTH TWICE DAILY WITH  A  MEAL. 60 tablet 1  . clotrimazole (LOTRIMIN) 1 % cream Apply 1 application topically 2 (two) times daily. 30 g 1  . fluticasone (FLONASE) 50 MCG/ACT nasal spray Place 1 spray into both nostrils daily.    . furosemide (LASIX) 40 MG tablet Take 1 tablet (40 mg total) by mouth 2 (two) times daily. 180 tablet 3  . lisinopril (PRINIVIL,ZESTRIL) 2.5 MG tablet Take 1 tablet (2.5 mg total) by mouth daily. 90 tablet 3  . potassium chloride SA (K-DUR,KLOR-CON) 20 MEQ tablet Take 1 tablet (20 mEq total) by mouth daily. 90 tablet 3  . pravastatin (PRAVACHOL) 40 MG tablet Take 1 tablet (40 mg total) by  mouth daily. 90 tablet 3  . rivaroxaban (XARELTO) 20 MG TABS tablet Take 1 tablet (20 mg total) by mouth daily with supper. 30 tablet 12  . tamsulosin (FLOMAX) 0.4 MG CAPS capsule Take 1 capsule (0.4 mg total) by mouth daily. 30 capsule 0  . acetaminophen (TYLENOL) 500 MG tablet Take 1,000 mg by mouth every 6 (six) hours as needed for moderate pain. Reported on 03/11/2016    . albuterol (PROAIR HFA) 108 (90 BASE) MCG/ACT inhaler Inhale 1 puff into the lungs every 6 (six) hours as needed for wheezing or shortness of breath. (Patient not taking: Reported on 03/11/2016) 1 Inhaler 5  . ondansetron (ZOFRAN ODT) 4 MG disintegrating tablet 4mg  ODT q4 hours prn nausea/vomit (Patient not taking: Reported on 03/11/2016) 15 tablet 0   Objective: BP 130/72 mmHg  Temp(Src) 98.9 F (37.2 C)  Wt 315 lb (142.883 kg) Gen: appears uncomfortable in chair and especially with walking CV: RRR no murmurs rubs or gallops Lungs: CTAB no crackles, wheeze, rhonchi Skin: warm, dry Along right testicle and onto shaft of penis but sparing tip has multiple vesicular lesions these extend onto buttocks. No lesions to left of midline.  Neuro: grossly normal, moves all extremities, very tender to touch lesions  Assessment/Plan:  Rash/shingles S:started tuesday. Had been doing a lot of yardwork- thought he touched some poison ivy. He peed in his hard and touched his groin. Never had rash on  his hands. Continues to not have rash on hadns despite no gloves. He has continued to note New lesions. Started on right testicle but now spreading up right side of his buttocks.  s2 and s3 distribution to rash. These are very painful- cannot sleep with this. Knows he should not take ibuprofen because of xarelto. Tylenol with minimal help.  ROS-not ill appearing, no fever/chills. No new medications. Not immunocompromised. No mucus membrane involvement.  A/P: This appears to be shingles in s2, s3 distribution on right. Very painful. Right aroudn 72  hours but still spreading so will treat with valtrex. Tramadol also given for pain given tender area. Discussed variable duration of pain from weeks to months to years if postherpetic neuralgia. He will return to care if uses all tramadol- plan right now is to only use at night to help him sleep.   Return precautions advised.   Meds ordered this encounter  Medications  . valACYclovir (VALTREX) 1000 MG tablet    Sig: Take 1 tablet (1,000 mg total) by mouth 3 (three) times daily.    Dispense:  21 tablet    Refill:  0  . traMADol (ULTRAM) 50 MG tablet    Sig: Take 1 tablet (50 mg total) by mouth every 8 (eight) hours as needed.    Dispense:  30 tablet    Refill:  0  higher risk patient with heart disease. Could not use nsaid for pain control as a result. Also higher risk due to area and level of pain and need to use narcotics- tramadol. New acute condition.

## 2016-03-11 NOTE — Patient Instructions (Signed)
You have shingles in your groin.   Treat with valtex for 7 days (antiviral)  Also gave you some pain medicine to use when pain more severe  Should scab over within 2-3 weeks but people often have pain for months (in some cases can last longer than that)  Lets have you check in if you run out of pain medicine or if new or worsening symptoms  Shingles Shingles, which is also known as herpes zoster, is an infection that causes a painful skin rash and fluid-filled blisters. Shingles is not related to genital herpes, which is a sexually transmitted infection.   Shingles only develops in people who:  Have had chickenpox.  Have received the chickenpox vaccine. (This is rare.) CAUSES Shingles is caused by varicella-zoster virus (VZV). This is the same virus that causes chickenpox. After exposure to VZV, the virus stays in the body in an inactive (dormant) state. Shingles develops if the virus reactivates. This can happen many years after the initial exposure to VZV. It is not known what causes this virus to reactivate. RISK FACTORS People who have had chickenpox or received the chickenpox vaccine are at risk for shingles. Infection is more common in people who:  Are older than age 46.  Have a weakened defense (immune) system, such as those with HIV, AIDS, or cancer.  Are taking medicines that weaken the immune system, such as transplant medicines.  Are under great stress. SYMPTOMS Early symptoms of this condition include itching, tingling, and pain in an area on your skin. Pain may be described as burning, stabbing, or throbbing. A few days or weeks after symptoms start, a painful red rash appears, usually on one side of the body in a bandlike or beltlike pattern. The rash eventually turns into fluid-filled blisters that break open, scab over, and dry up in about 2-3 weeks. At any time during the infection, you may also develop:  A fever.  Chills.  A headache.  An upset  stomach. DIAGNOSIS This condition is diagnosed with a skin exam. Sometimes, skin or fluid samples are taken from the blisters before a diagnosis is made. These samples are examined under a microscope or sent to a lab for testing. TREATMENT There is no specific cure for this condition. Your health care provider will probably prescribe medicines to help you manage pain, recover more quickly, and avoid long-term problems. Medicines may include:  Antiviral drugs.  Anti-inflammatory drugs.  Pain medicines. If the area involved is on your face, you may be referred to a specialist, such as an eye doctor (ophthalmologist) or an ear, nose, and throat (ENT) doctor to help you avoid eye problems, chronic pain, or disability. HOME CARE INSTRUCTIONS Medicines  Take medicines only as directed by your health care provider.  Apply an anti-itch or numbing cream to the affected area as directed by your health care provider. Blister and Rash Care  Take a cool bath or apply cool compresses to the area of the rash or blisters as directed by your health care provider. This may help with pain and itching.  Keep your rash covered with a loose bandage (dressing). Wear loose-fitting clothing to help ease the pain of material rubbing against the rash.  Keep your rash and blisters clean with mild soap and cool water or as directed by your health care provider.  Check your rash every day for signs of infection. These include redness, swelling, and pain that lasts or increases.  Do not pick your blisters.  Do not  scratch your rash. General Instructions  Rest as directed by your health care provider.  Keep all follow-up visits as directed by your health care provider. This is important.  Until your blisters scab over, your infection can cause chickenpox in people who have never had it or been vaccinated against it. To prevent this from happening, avoid contact with other people,  especially:  Babies.  Pregnant women.  Children who have eczema.  Elderly people who have transplants.  People who have chronic illnesses, such as leukemia or AIDS. SEEK MEDICAL CARE IF:  Your pain is not relieved with prescribed medicines.  Your pain does not get better after the rash heals.  Your rash looks infected. Signs of infection include redness, swelling, and pain that lasts or increases. SEEK IMMEDIATE MEDICAL CARE IF:  The rash is on your face or nose.  You have facial pain, pain around your eye area, or loss of feeling on one side of your face.  You have ear pain or you have ringing in your ear.  You have loss of taste.  Your condition gets worse.   This information is not intended to replace advice given to you by your health care provider. Make sure you discuss any questions you have with your health care provider.   Document Released: 11/21/2005 Document Revised: 12/12/2014 Document Reviewed: 10/02/2014 Elsevier Interactive Patient Education [employer]

## 2016-04-25 IMAGING — CT CT ABD-PELV W/ CM
2 of 5 series · 16 of 46 positions shown, 18 images · IV contrast (omnipaque)
Comparison: None

CLINICAL DATA: Nausea, vomiting and LEFT lower quadrant pain
beginning earlier today, LEFT lower quadrant tenderness, history
hypertension, CHF, hypertensive heart disease, obesity,
hyperlipidemia, former smoker

EXAM:
CT ABDOMEN AND PELVIS WITH CONTRAST
TECHNIQUE: Multidetector CT imaging of the abdomen and pelvis was performed
using the standard protocol following bolus administration of
intravenous contrast. Sagittal and coronal MPR images reconstructed
from axial data set.
CONTRAST:  100mL OMNIPAQUE IOHEXOL 300 MG/ML SOLN IV. Dilute oral
contrast.

[Series 3: abd/ pelvis 5.0 i30f 1 · axial · 0.82mm/px · z∈[-1226,-806]mm · 13 of 96 slices shown, 15 images]
[im 6/96  soft-tissue]
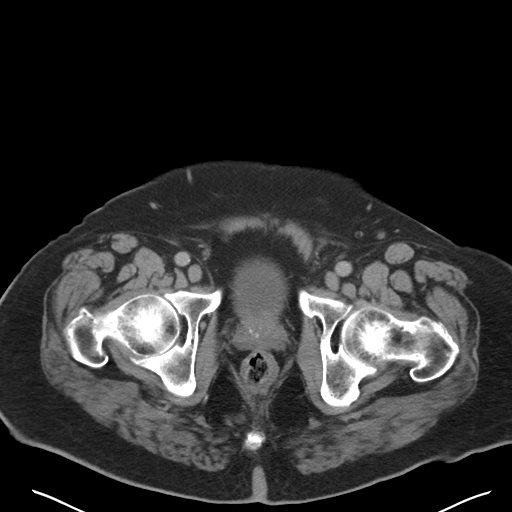
[im 6/96  bone]
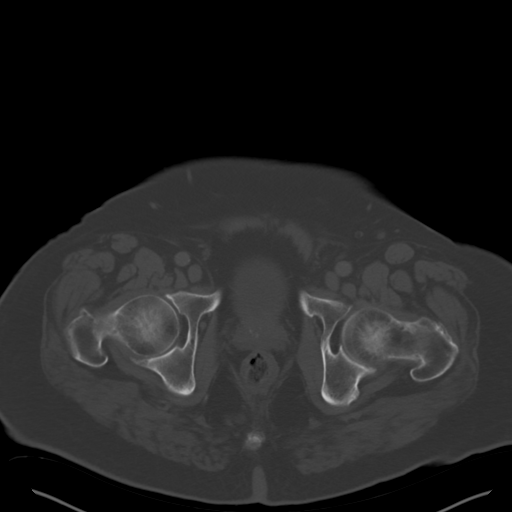
[im 11/96  soft-tissue]
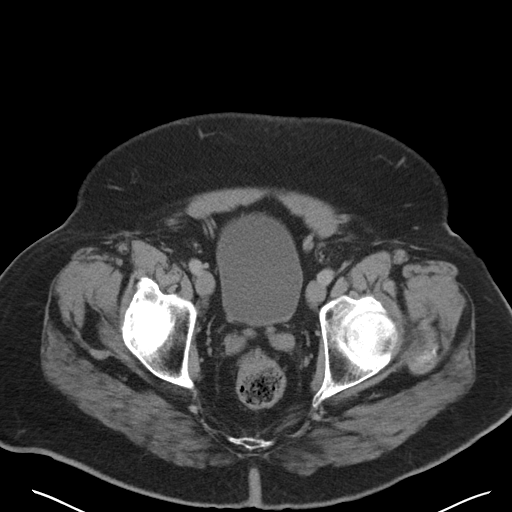
[im 22/96  soft-tissue]
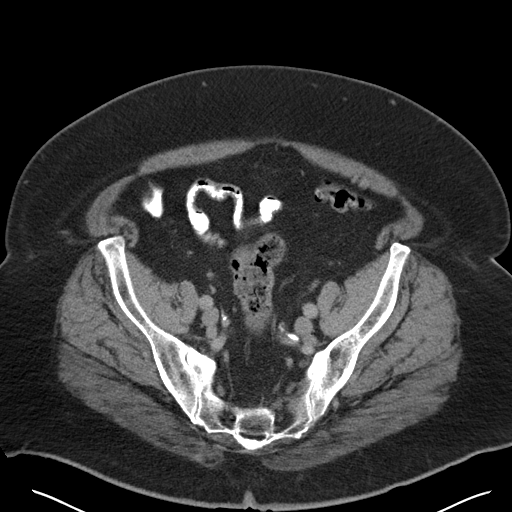
[im 27/96  soft-tissue]
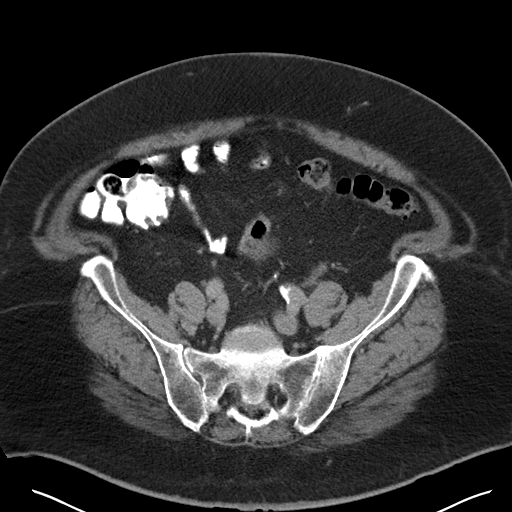
[im 32/96  soft-tissue]
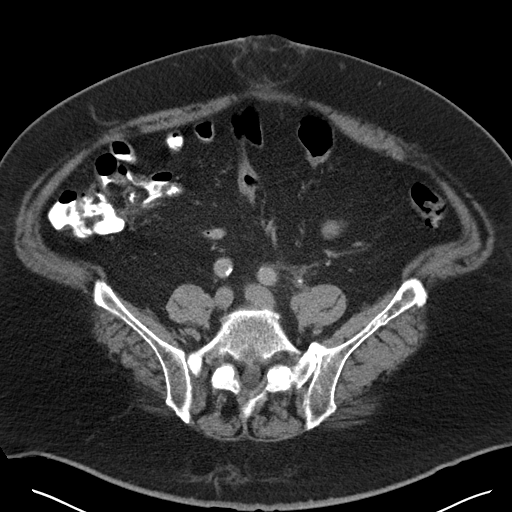
[im 43/96  soft-tissue]
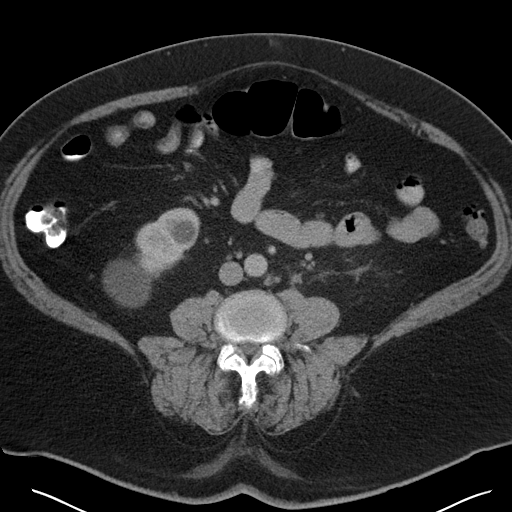
[im 48/96  soft-tissue]
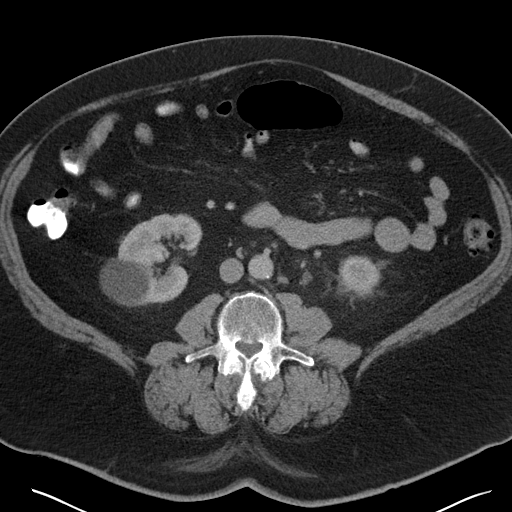
[im 53/96  soft-tissue]
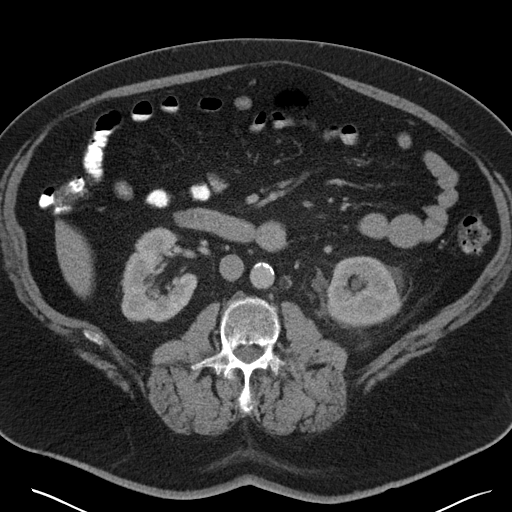
[im 64/96  soft-tissue]
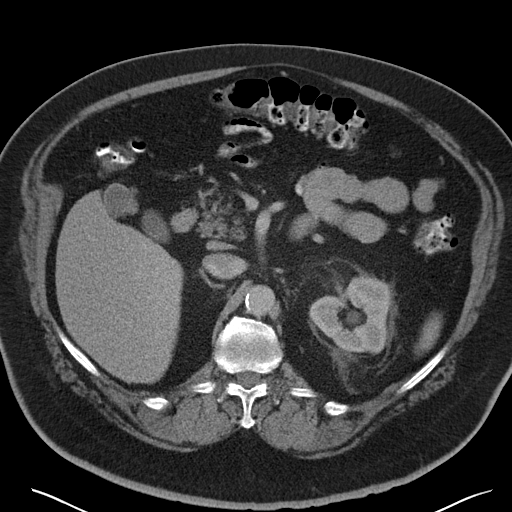
[im 64/96  bone]
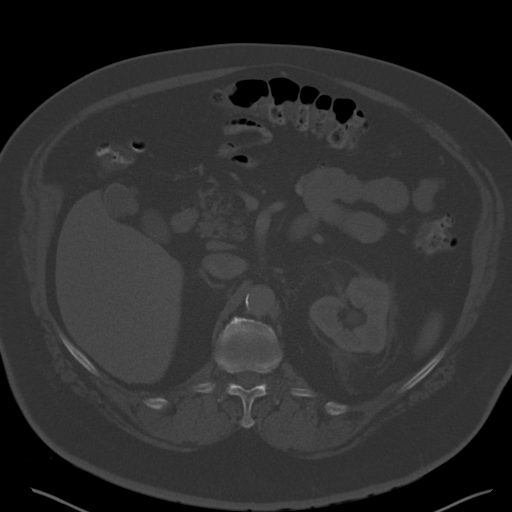
[im 69/96  soft-tissue]
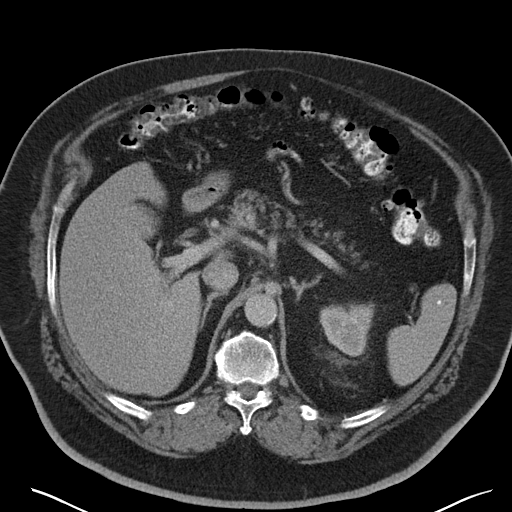
[im 74/96  soft-tissue]
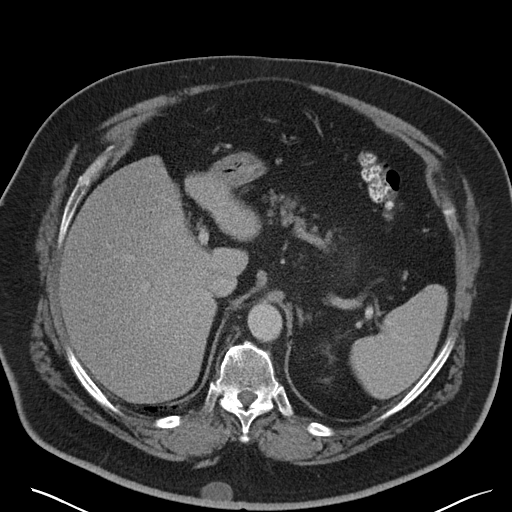
[im 85/96  soft-tissue]
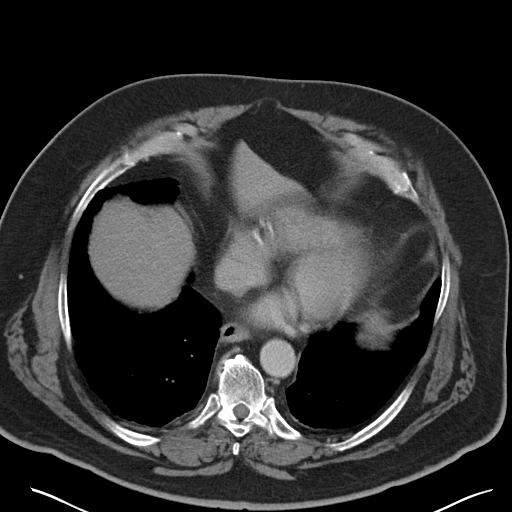
[im 90/96  soft-tissue]
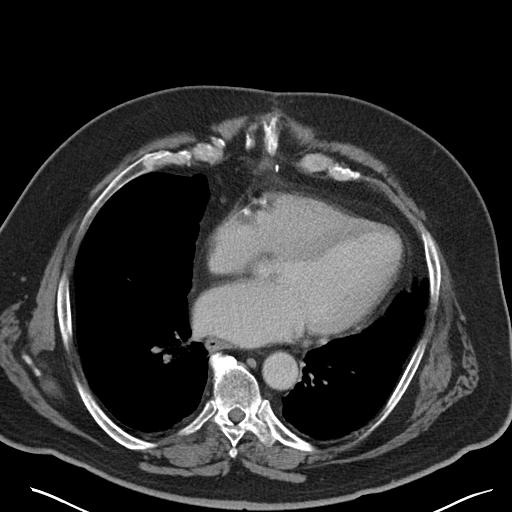

[Series 6: coronal soft tissue · coronal · 0.85mm/px · 3 of 115 slices shown]
[im 39/115  soft-tissue]
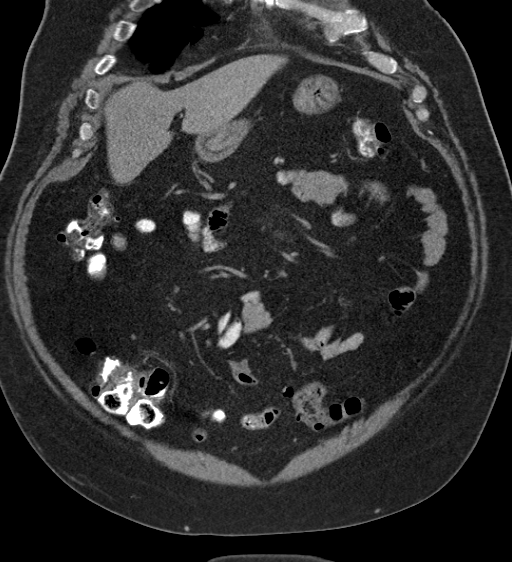
[im 51/115  soft-tissue]
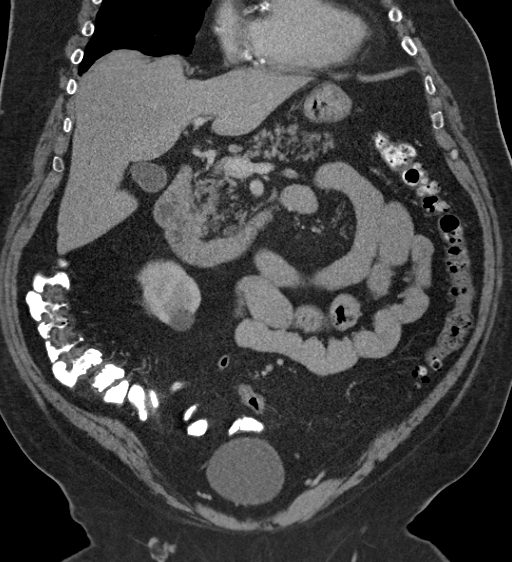
[im 64/115  soft-tissue]
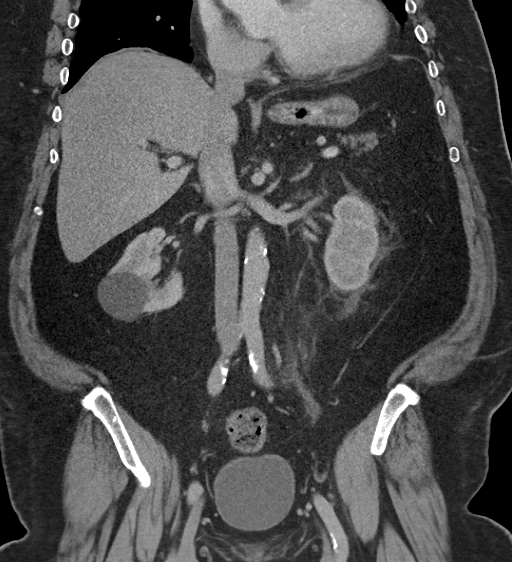

[16 of 46 positions shown; findings below may reference images not displayed]

FINDINGS: Lung bases clear.

Scattered atherosclerotic calcifications aorta and coronary
arteries.

LEFT hydronephrosis secondary to a 5 mm mid LEFT ureteral calculus
image 64.

Additional nonobstructing BILATERAL renal calculi largest 12 mm
diameter mid LEFT kidney.

LEFT perinephric and periureteral edema.

RIGHT renal cysts largest 5.4 x 4.1 cm at inferior pole.

Calcified splenic granulomata.

Liver, gallbladder, spleen, pancreas, and adrenal glands otherwise
unremarkable.

Moderate-sized umbilical hernia containing fat, fascial defect 3.6 x
3.5 cm.

Normal appendix.

Diverticulosis of descending and sigmoid colon without evidence of
diverticulitis.

No mass, adenopathy, free air or free fluid.

Bones demineralized.

LEFT inguinal hernia containing fat.
IMPRESSION: LEFT hydronephrosis and proximal ureteral dilatation secondary to a
5 mm mid LEFT ureteral calculus.

Additional BILATERAL nonobstructing renal calculi and RIGHT renal
cysts.

Distal colonic diverticulosis without evidence of diverticulitis.

Umbilical and LEFT inguinal hernias containing fat.

## 2016-07-16 ENCOUNTER — Other Ambulatory Visit: Payer: Self-pay | Admitting: Internal Medicine

## 2016-07-16 DIAGNOSIS — I5022 Chronic systolic (congestive) heart failure: Secondary | ICD-10-CM

## 2016-10-04 ENCOUNTER — Encounter (HOSPITAL_COMMUNITY): Payer: Self-pay | Admitting: *Deleted

## 2016-10-04 ENCOUNTER — Ambulatory Visit (HOSPITAL_COMMUNITY)
Admission: EM | Admit: 2016-10-04 | Discharge: 2016-10-04 | Disposition: A | Discharge status: Nursing Home (Includes ICF) | Payer: Non-veteran care | Attending: Emergency Medicine | Admitting: Emergency Medicine

## 2016-10-04 ENCOUNTER — Emergency Department (HOSPITAL_COMMUNITY)
Admission: EM | Admit: 2016-10-04 | Discharge: 2016-10-04 | Disposition: A | Discharge status: Home / Self Care | Payer: Non-veteran care | Attending: Emergency Medicine | Admitting: Emergency Medicine

## 2016-10-04 ENCOUNTER — Ambulatory Visit (INDEPENDENT_AMBULATORY_CARE_PROVIDER_SITE_OTHER): Payer: Non-veteran care

## 2016-10-04 ENCOUNTER — Encounter (HOSPITAL_COMMUNITY): Payer: Self-pay | Admitting: Emergency Medicine

## 2016-10-04 DIAGNOSIS — I110 Hypertensive heart disease with heart failure: Secondary | ICD-10-CM | POA: Diagnosis not present

## 2016-10-04 DIAGNOSIS — Z951 Presence of aortocoronary bypass graft: Secondary | ICD-10-CM | POA: Insufficient documentation

## 2016-10-04 DIAGNOSIS — I5023 Acute on chronic systolic (congestive) heart failure: Secondary | ICD-10-CM | POA: Insufficient documentation

## 2016-10-04 DIAGNOSIS — R9431 Abnormal electrocardiogram [ECG] [EKG]: Secondary | ICD-10-CM

## 2016-10-04 DIAGNOSIS — R05 Cough: Secondary | ICD-10-CM | POA: Diagnosis not present

## 2016-10-04 DIAGNOSIS — R0602 Shortness of breath: Secondary | ICD-10-CM | POA: Insufficient documentation

## 2016-10-04 DIAGNOSIS — Z87891 Personal history of nicotine dependence: Secondary | ICD-10-CM | POA: Diagnosis not present

## 2016-10-04 DIAGNOSIS — Z7901 Long term (current) use of anticoagulants: Secondary | ICD-10-CM | POA: Insufficient documentation

## 2016-10-04 DIAGNOSIS — Z7982 Long term (current) use of aspirin: Secondary | ICD-10-CM | POA: Insufficient documentation

## 2016-10-04 DIAGNOSIS — Z79899 Other long term (current) drug therapy: Secondary | ICD-10-CM | POA: Insufficient documentation

## 2016-10-04 DIAGNOSIS — R059 Cough, unspecified: Secondary | ICD-10-CM

## 2016-10-04 LAB — BASIC METABOLIC PANEL
ANION GAP: 11 (ref 5–15)
BUN: 22 mg/dL — ABNORMAL HIGH (ref 6–20)
CALCIUM: 9.1 mg/dL (ref 8.9–10.3)
CO2: 22 mmol/L (ref 22–32)
Chloride: 107 mmol/L (ref 101–111)
Creatinine, Ser: 1.18 mg/dL (ref 0.61–1.24)
Glucose, Bld: 99 mg/dL (ref 65–99)
POTASSIUM: 3.7 mmol/L (ref 3.5–5.1)
SODIUM: 140 mmol/L (ref 135–145)

## 2016-10-04 LAB — CBC
HCT: 42.1 % (ref 39.0–52.0)
HEMOGLOBIN: 14.2 g/dL (ref 13.0–17.0)
MCH: 30.7 pg (ref 26.0–34.0)
MCHC: 33.7 g/dL (ref 30.0–36.0)
MCV: 91.1 fL (ref 78.0–100.0)
Platelets: 219 10*3/uL (ref 150–400)
RBC: 4.62 MIL/uL (ref 4.22–5.81)
RDW: 13.6 % (ref 11.5–15.5)
WBC: 6.3 10*3/uL (ref 4.0–10.5)

## 2016-10-04 LAB — D-DIMER, QUANTITATIVE (NOT AT ARMC): D DIMER QUANT: 0.38 ug{FEU}/mL (ref 0.00–0.50)

## 2016-10-04 LAB — BRAIN NATRIURETIC PEPTIDE: B NATRIURETIC PEPTIDE 5: 89.6 pg/mL (ref 0.0–100.0)

## 2016-10-04 LAB — TROPONIN I

## 2016-10-04 MED ORDER — BENZONATATE 100 MG PO CAPS: 100.0000 mg | ORAL_CAPSULE | Freq: Three times a day (TID) | ORAL | 0 refills | Status: DC

## 2016-10-04 MED ORDER — ALBUTEROL SULFATE (2.5 MG/3ML) 0.083% IN NEBU: 5.0000 mg | INHALATION_SOLUTION | Freq: Once | RESPIRATORY_TRACT | Status: DC

## 2016-10-04 MED ORDER — FUROSEMIDE 10 MG/ML IJ SOLN
40.0000 mg | Freq: Once | INTRAMUSCULAR | Status: AC
Start: 2016-10-04 — End: 2016-10-04
  Administered 2016-10-04: 40 mg via INTRAVENOUS
  Filled 2016-10-04: qty 4

## 2016-10-04 NOTE — Discharge Instructions (Signed)
Please increase her Lasix and take 40 mg 3 times a day for the next 2 days. After 2 days you can go back to taking 40 mg twice daily. You need to follow-up with your primary care doctor tomorrow to schedule an appointment. Please take the Tessalon as needed for cough. Please return to the ED if your shortness of breath worsens, he developed chest pain, vomiting, sweating, or for any other reason.

## 2016-10-04 NOTE — ED Notes (Signed)
[facility] ED First RN contacted.

## 2016-10-04 NOTE — ED Provider Notes (Signed)
[facility]    CSN: [id number] Arrival date & time: 10/04/16  1338     History   Chief Complaint Chief Complaint  Patient presents with  . Cough    HPI Charles Mckinney is a 65 y.o. male.   HPI  He is a 65 year old man here for evaluation of cough. The cough started 2 days ago and has been getting worse. It is worse when laying flat. He also reports shortness of breath and wheezing, again worse when laying flat. He also reports some dyspnea on exertion and fatigue. No fevers. He reports some very mild nasal congestion. No sore throat. Denies significant change in leg size. He does have a history of A. fib and heart failure. He does not weigh himself daily.  Past Medical History:  Diagnosis Date  . A-fib (HCC)   . Anxiety   . CHF (congestive heart failure) (HCC)   . Dysrhythmia    ATRIAL FIBRILATION  . GERD (gastroesophageal reflux disease)   . Hyperlipidemia   . Hypertension   . Hypertensive heart disease   . Kidney stones   . Obesity (BMI 30-39.9)   . Osteoarthritis   . PONV (postoperative nausea and vomiting)   . Shortness of breath dyspnea     Patient Active Problem List   Diagnosis Date Noted  . 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 Acute on chronic systolic heart failure (HCC) 04/05/2015  . Acute systolic heart failure (HCC) 03/31/2015  . Acute on chronic systolic congestive heart failure (HCC) 01/19/2015  . Chronic systolic heart failure (HCC) 01/19/2015  . 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 Long-term (current) use of anticoagulants 01/19/2015  . Paroxysmal atrial fibrillation (HCC)   . GERD 11/22/2007  . Hyperlipidemia   . Morbid obesity (HCC)   . Hypertensive heart disease     Past Surgical History:  Procedure Laterality Date  . CARDIAC CATHETERIZATION N/A 04/09/2015   Procedure: Right/Left Heart Cath and Coronary Angiography;  Surgeon: Orpah Cobb, MD;  Location: [facility];  Service: Cardiovascular;  Laterality:  N/A;  . CARDIOVERSION N/A 01/19/2015   Procedure: CARDIOVERSION;  Surgeon: Othella Boyer, MD;  Location: [facility] ENDOSCOPY;  Service: Cardiovascular;  Laterality: N/A;  pt in Afib, 12:22 synched cardioversion @  120 joules using Propofol 100 mg,IV....unsuccessful, repeated at 200 joules  . CARDIOVERSION N/A 04/02/2015   Procedure: CARDIOVERSION;  Surgeon: Orpah Cobb, MD;  Location: [facility] ENDOSCOPY;  Service: Cardiovascular;  Laterality: N/A;  . CATARACT EXTRACTION    . CLIPPING OF ATRIAL APPENDAGE  04/13/2015   Procedure: CLIPPING OF ATRIAL APPENDAGE;  Surgeon: Alleen Borne, MD;  Location: [facility] OR;  Service: Open Heart Surgery;;  . CORONARY ARTERY BYPASS GRAFT N/A 04/13/2015   Procedure: CORONARY ARTERY BYPASS GRAFTING (CABG);  Surgeon: Alleen Borne, MD;  Location: [facility] OR;  Service: Open Heart Surgery;  Laterality: N/A;  . TEE WITHOUT CARDIOVERSION N/A 04/13/2015   Procedure: TRANSESOPHAGEAL ECHOCARDIOGRAM (TEE);  Surgeon: Alleen Borne, MD;  Location: [facility] OR;  Service: Open Heart Surgery;  Laterality: N/A;       Home Medications    Prior to Admission medications   Medication Sig Start Date End Date Taking? Authorizing Provider  acetaminophen (TYLENOL) 500 MG tablet Take 1,000 mg by mouth every 6 (six) hours as needed for moderate pain. Reported on 03/11/2016    Historical Provider, MD  albuterol (PROAIR HFA) 108 (90 BASE) MCG/ACT inhaler Inhale 1 puff into the lungs every 6 (  six) hours as needed for wheezing or shortness of breath. Patient not taking: Reported on 03/11/2016 09/25/15   [name], MD  allopurinol (ZYLOPRIM) 100 MG tablet Take 1 tablet (100 mg total) by mouth daily. 09/25/15   [name], MD  amiodarone (PACERONE) 200 MG tablet Take 1 tablet (200 mg total) by mouth daily. 09/25/15   [name], MD  aspirin 81 MG EC tablet Take 1 tablet (81 mg total) by mouth daily. Swallow whole. Patient taking differently: Take 81 mg by mouth daily as needed for pain. Swallow  whole. 09/03/12   [name], MD  carvedilol (COREG) 6.25 MG tablet TAKE ONE TABLET BY MOUTH TWICE DAILY WITH  A  MEAL. 02/04/16   Brittainy Sherlynn Carbon, PA-C  carvedilol (COREG) 6.25 MG tablet TAKE 1 TABLET(6.25 MG) BY MOUTH TWICE DAILY WITH A MEAL 07/18/16   [name], MD  clotrimazole (LOTRIMIN) 1 % cream Apply 1 application topically 2 (two) times daily. 09/25/15   [name], MD  fluticasone (FLONASE) 50 MCG/ACT nasal spray Place 1 spray into both nostrils daily.    Historical Provider, MD  furosemide (LASIX) 40 MG tablet Take 1 tablet (40 mg total) by mouth 2 (two) times daily. 09/25/15   [name], MD  lisinopril (PRINIVIL,ZESTRIL) 2.5 MG tablet Take 1 tablet (2.5 mg total) by mouth daily. 09/25/15   [name], MD  ondansetron (ZOFRAN ODT) 4 MG disintegrating tablet 4mg  ODT q4 hours prn nausea/vomit Patient not taking: Reported on 03/11/2016 09/21/15   Mirian Mo, MD  potassium chloride SA (K-DUR,KLOR-CON) 20 MEQ tablet Take 1 tablet (20 mEq total) by mouth daily. 09/25/15   [name], MD  pravastatin (PRAVACHOL) 40 MG tablet Take 1 tablet (40 mg total) by mouth daily. 09/25/15   [name], MD  rivaroxaban (XARELTO) 20 MG TABS tablet Take 1 tablet (20 mg total) by mouth daily with supper. 09/25/15   [name], MD  tamsulosin (FLOMAX) 0.4 MG CAPS capsule Take 1 capsule (0.4 mg total) by mouth daily. 09/21/15   Mirian Mo, MD  traMADol (ULTRAM) 50 MG tablet Take 1 tablet (50 mg total) by mouth every 8 (eight) hours as needed. 03/11/16   Shelva Majestic, MD  valACYclovir (VALTREX) 1000 MG tablet Take 1 tablet (1,000 mg total) by mouth 3 (three) times daily. 03/11/16   Shelva Majestic, MD    Family History Family History  Problem Relation Age of Onset  . Diabetes Mother   . Heart disease Father   . Stroke Brother   . Heart disease Brother   . Diabetes Brother     Social History Social History  Substance Use  Topics  . Smoking status: Former Games developer  . Smokeless tobacco: Never Used  . Alcohol use No     Allergies   Niacin and related and Penicillins   Review of Systems Review of Systems As in history of present illness  Physical Exam Triage Vital Signs ED Triage Vitals  Enc Vitals Group     BP 10/04/16 1352 165/78     Pulse Rate 10/04/16 1352 79     Resp 10/04/16 1352 16     Temp 10/04/16 1352 98.3 F (36.8 C)     Temp Source 10/04/16 1352 Oral     SpO2 10/04/16 1352 99 %     Weight --      Height --      Head Circumference --  Peak Flow --      Pain Score 10/04/16 1351 0     Pain Loc --      Pain Edu? --      Excl. in GC? --    No data found.   Updated Vital Signs BP 165/78 (BP Location: Left Arm)   Pulse 79   Temp 98.3 F (36.8 C) (Oral)   Resp 16   SpO2 99%   Visual Acuity Right Eye Distance:   Left Eye Distance:   Bilateral Distance:    Right Eye Near:   Left Eye Near:    Bilateral Near:     Physical Exam  Constitutional: He is oriented to person, place, and time. He appears well-developed and well-nourished. No distress.  Neck: Neck supple.  Cardiovascular: Normal rate, regular rhythm and normal heart sounds.   No murmur heard. Pulmonary/Chest: Effort normal. No respiratory distress. He has wheezes.  Crackles in lower lung fields  Musculoskeletal: He exhibits edema (2+ pitting edema to knees bilaterally).  Neurological: He is alert and oriented to person, place, and time.  Skin: He is not diaphoretic.     UC Treatments / Results  Labs (all labs ordered are listed, but only abnormal results are displayed) Labs Reviewed - No data to display  EKG  EKG Interpretation None       Radiology Dg Chest 2 View  Result Date: 10/04/2016 CLINICAL DATA:  Onset of productive cough this past Sunday which has increased in severity. History of asthma, previous CABG, morbid obesity, former smoker. EXAM: CHEST  2 VIEW COMPARISON:  PA and lateral chest  x-ray of May 27, 2015 FINDINGS: The lungs are well-expanded. There is no focal infiltrate. There is no pleural effusion. The cardiac silhouette is enlarged. The pulmonary vascularity is mildly engorged. There is calcification in the wall of the aortic arch. The 6 sternal wires are intact. There is a left atrial appendage clip. The observed bony thorax is unremarkable. IMPRESSION: Chronic bronchitic changes. Mild stable cardiomegaly with central pulmonary vascular congestion may indicate low-grade compensated CHF. Aortic atherosclerosis. Electronically Signed   By: David  [place] M.D.   On: 10/04/2016 15:29    Procedures ED EKG Date/Time: 10/04/2016 4:17 PM Performed by: Charm Rings Authorized by: Charm Rings   ECG reviewed by ED Physician in the absence of a cardiologist: yes   Previous ECG:    Previous ECG:  Compared to current   Comparison ECG info:  New RBBB   Similarity:  Changes noted Interpretation:    Interpretation: abnormal   Rate:    ECG rate:  72   ECG rate assessment: normal   Rhythm:    Rhythm: sinus rhythm   Ectopy:    Ectopy: none   QRS:    QRS axis:  Left   QRS intervals:  Wide Conduction:    Conduction: abnormal     Abnormal conduction: complete RBBB   ST segments:    ST segments:  Normal T waves:    T waves: inverted     Inverted:  V1 and V2   (including critical care time)  Medications Ordered in UC Medications - No data to display   Initial Impression / Assessment and Plan / UC Course  I have reviewed the triage vital signs and the nursing notes.  Pertinent labs & imaging results that were available during my care of the patient were reviewed by me and considered in my medical decision making (see chart for details).  Clinical  Course    Concern for acute on chronic heart failure. New Right bundle branch block on EKG. I'm unable to get in touch with his cardiologist.  Maryclare LabradorWe'll transfer to the ER for additional evaluation and  monitoring.   Final Clinical Impressions(s) / UC Diagnoses   Final diagnoses:  Cough  Abnormal EKG    New Prescriptions Discharge Medication List as of 10/04/2016  4:16 PM       Charm RingsErin J Juancarlos Crescenzo, MD 10/04/16 [phone]

## 2016-10-04 NOTE — ED Triage Notes (Signed)
Pt  Reports   Cough    Congested   With  Symptoms  For  Several  Days   Pt  Reports   Symptoms      Of      Weakness        As  Well     Gets  Sob  On  Exertion

## 2016-10-04 NOTE — ED Provider Notes (Signed)
[facility]-EMER[facility]ENCY DEPT Provider Note   CSN: [id number] Arrival date & time: 10/04/16  1626     History   Chief Complaint Chief Complaint  Patient presents with  . Cough  . Shortness of Breath    HPI Charles Mckinney is a 65 y.o. male.  65 year old Caucasian male with past medical history significant for congestive heart failure, CABG in 2016, hypertension, atrial fibrillation, obesity that presents to the ED today for cough and shortness of breath. Patient states that his cough started approximately 2 years ago has been getting worse. States that it is worse when he is lying flat. States he is coughing up white to yellowish mucus. Patient also endorses mild dyspnea on exertion and fatigue that is new in onset over the past 2 days. Symptoms are acute in onset and gradually worsening. Nothing makes better or worse. He is not driving home for the cough. Patient states these has had some mild nasal congestion over the past week but that has resolved. Endorses a sore throat that is related to his cough. Patient states that his legs are so edematous at baseline however they are not significantly larger. Patient does take Lasix at home and has been taking it regularly. Patient denies any fever, chills, headache, vision changes, lightheadedness, dizziness, chest pain, abdominal pain, emesis, nausea, urinary symptoms, change in bowel habits, numbness/tingling.   The patient was seen at urgent care prior to arrival. At that time he had a chest x-ray performed that noted some pulmonary congestion. He has had EKG performed that showed a new right bundle branch block. The doctor she was concerned for acute on chronic CHF and discharge him come to the ED for further evaluation.        Past Medical History:  Diagnosis Date  . A-fib (HCC)   . Anxiety   . CHF (congestive heart failure) (HCC)   . Dysrhythmia    ATRIAL FIBRILATION  . GERD (gastroesophageal reflux disease)   . Hyperlipidemia   .  Hypertension   . Hypertensive heart disease   . Kidney stones   . Obesity (BMI 30-39.9)   . Osteoarthritis   . PONV (postoperative nausea and vomiting)   . Shortness of breath dyspnea     Patient Active Problem List   Diagnosis Date Noted  . 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 Acute on chronic systolic heart failure (HCC) 04/05/2015  . Acute systolic heart failure (HCC) 03/31/2015  . Acute on chronic systolic congestive heart failure (HCC) 01/19/2015  . Chronic systolic heart failure (HCC) 01/19/2015  . 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 Long-term (current) use of anticoagulants 01/19/2015  . Paroxysmal atrial fibrillation (HCC)   . GERD 11/22/2007  . Hyperlipidemia   . Morbid obesity (HCC)   . Hypertensive heart disease     Past Surgical History:  Procedure Laterality Date  . CARDIAC CATHETERIZATION N/A 04/09/2015   Procedure: Right/Left Heart Cath and Coronary Angiography;  Surgeon: Orpah CobbAjay Kadakia, MD;  Location: [facility];  Service: Cardiovascular;  Laterality: N/A;  . CARDIOVERSION N/A 01/19/2015   Procedure: CARDIOVERSION;  Surgeon: Othella BoyerWilliam S Tilley, MD;  [facility];  Service: Cardiovascular;  Laterality: N/A;  pt in Afib, 12:22 synched cardioversion @  120 joules using Propofol 100 mg,IV....unsuccessful, repeated at 200 joules  . CARDIOVERSION N/A 04/02/2015   Procedure: CARDIOVERSION;  Surgeon: Orpah CobbAjay Kadakia, MD;  [facility];  Service: Cardiovascular;  Laterality: N/A;  . CATARACT EXTRACTION    .  CLIPPING OF ATRIAL APPENDAGE  04/13/2015   Procedure: CLIPPING OF ATRIAL APPENDAGE;  Surgeon: Alleen Borne, MD;  Location: [facility] OR;  Service: Open Heart Surgery;;  . CORONARY ARTERY BYPASS GRAFT N/A 04/13/2015   Procedure: CORONARY ARTERY BYPASS GRAFTING (CABG);  Surgeon: Alleen Borne, MD;  Location[facility];  Service: Open Heart Surgery;  Laterality: N/A;  . TEE WITHOUT CARDIOVERSION N/A 04/13/2015   Procedure: TRANSESOPHAGEAL  ECHOCARDIOGRAM (TEE);  Surgeon: Alleen Borne, MD;  Location: [facility] OR;  Service: Open Heart Surgery;  Laterality: N/A;       Home Medications    Prior to Admission medications   Medication Sig Start Date End Date Taking? Authorizing Provider  acetaminophen (TYLENOL) 500 MG tablet Take 1,000 mg by mouth every 6 (six) hours as needed for moderate pain. Reported on 03/11/2016   Yes Historical Provider, MD  albuterol (PROAIR HFA) 108 (90 BASE) MCG/ACT inhaler Inhale 1 puff into the lungs every 6 (six) hours as needed for wheezing or shortness of breath. 09/25/15  Yes [name], MD  allopurinol (ZYLOPRIM) 100 MG tablet Take 1 tablet (100 mg total) by mouth daily. 09/25/15  Yes [name], MD  amiodarone (PACERONE) 200 MG tablet Take 1 tablet (200 mg total) by mouth daily. 09/25/15  Yes [name], MD  aspirin 81 MG EC tablet Take 1 tablet (81 mg total) by mouth daily. Swallow whole. Patient taking differently: Take 81 mg by mouth daily as needed for pain. Swallow whole. 09/03/12  Yes [name], MD  carvedilol (COREG) 6.25 MG tablet TAKE ONE TABLET BY MOUTH TWICE DAILY WITH  A  MEAL. 02/04/16  Yes Brittainy Sherlynn Carbon, PA-C  fluticasone (FLONASE) 50 MCG/ACT nasal spray Place 1 spray into both nostrils daily.   Yes Historical Provider, MD  furosemide (LASIX) 40 MG tablet Take 1 tablet (40 mg total) by mouth 2 (two) times daily. 09/25/15  Yes [name], MD  lisinopril (PRINIVIL,ZESTRIL) 2.5 MG tablet Take 1 tablet (2.5 mg total) by mouth daily. 09/25/15  Yes [name], MD  potassium chloride SA (K-DUR,KLOR-CON) 20 MEQ tablet Take 1 tablet (20 mEq total) by mouth daily. 09/25/15  Yes [name], MD  pravastatin (PRAVACHOL) 40 MG tablet Take 1 tablet (40 mg total) by mouth daily. 09/25/15  Yes [name], MD  rivaroxaban (XARELTO) 20 MG TABS tablet Take 1 tablet (20 mg total) by mouth daily with supper. 09/25/15  Yes [name], MD  valACYclovir (VALTREX) 1000 MG tablet Take 1 tablet (1,000 mg total) by mouth 3 (three) times daily. 03/11/16  Yes Shelva Majestic, MD  carvedilol (COREG) 6.25 MG tablet TAKE 1 TABLET(6.25 MG) BY MOUTH TWICE DAILY WITH A MEAL Patient not taking: Reported on 10/04/2016 07/18/16   [name], MD  clotrimazole (LOTRIMIN) 1 % cream Apply 1 application topically 2 (two) times daily. Patient not taking: Reported on 10/04/2016 09/25/15   [name], MD  ondansetron (ZOFRAN ODT) 4 MG disintegrating tablet 4mg  ODT q4 hours prn nausea/vomit Patient not taking: Reported on 10/04/2016 09/21/15   Mirian Mo, MD  traMADol (ULTRAM) 50 MG tablet Take 1 tablet (50 mg total) by mouth every 8 (eight) hours as needed. Patient not taking: Reported on 10/04/2016 03/11/16   Shelva Majestic, MD    Family History Family History  Problem Relation Age of Onset  . Diabetes Mother   . Heart disease Father   . Stroke Brother   .  Heart disease Brother   . Diabetes Brother     Social History Social History  Substance Use Topics  . Smoking status: Former Games developer  . Smokeless tobacco: Never Used  . Alcohol use No     Allergies   Niacin and related and Penicillins   Review of Systems Review of Systems  Constitutional: Negative for chills and fever.  HENT: Positive for congestion, rhinorrhea and sore throat. Negative for ear pain.   Eyes: Negative for pain and discharge.  Respiratory: Positive for cough and shortness of breath. Negative for wheezing.   Cardiovascular: Positive for leg swelling (bilteral at baseline). Negative for chest pain and palpitations.  Gastrointestinal: Negative for abdominal pain, blood in stool, diarrhea, nausea and vomiting.  Genitourinary: Negative for flank pain, frequency, hematuria and urgency.  Musculoskeletal: Negative for myalgias and neck pain.  Skin: Negative.   Neurological: Negative for dizziness, syncope, weakness, light-headedness,  numbness and headaches.  All other systems reviewed and are negative.    Physical Exam Updated Vital Signs BP 146/85   Pulse 73   Temp 97.5 F (36.4 C) (Oral)   Resp 23   Ht 5\' 10"  (1.778 m)   Wt (!) 141.1 kg   SpO2 97%   BMI 44.62 kg/m   Physical Exam  Constitutional: He is oriented to person, place, and time. He appears well-developed and well-nourished. No distress.  HENT:  Head: Normocephalic and atraumatic.  Right Ear: Tympanic membrane, external ear and ear canal normal.  Left Ear: Tympanic membrane, external ear and ear canal normal.  Nose: Nose normal.  Mouth/Throat: Uvula is midline, oropharynx is clear and moist and mucous membranes are normal.  Eyes: Conjunctivae are normal. Pupils are equal, round, and reactive to light. Right eye exhibits no discharge. Left eye exhibits no discharge. No scleral icterus.  Neck: Normal range of motion. Neck supple. No thyromegaly present.  Cardiovascular: Normal rate, regular rhythm, normal heart sounds and intact distal pulses.  Exam reveals no gallop and no friction rub.   No murmur heard. Pulmonary/Chest: Effort normal. No accessory muscle usage. Tachypnea noted. No respiratory distress. He has no decreased breath sounds. He has wheezes.  Crackles heard in the lower lung bases bilaterally. Patient has not respiratory distress. Mildly tachypnea. Patient is not hypoxic. Satting at 97% on room air.  Abdominal: Soft. Bowel sounds are normal. He exhibits no distension. There is no tenderness. There is no rebound and no guarding.  Musculoskeletal: Normal range of motion. He exhibits edema.  1+ pitting edema noted up to the knees Bilaterally. No calf tenderness. DP pulses are 2+ bilaterally. Sensation intact. Cap refill normal.  Lymphadenopathy:    He has no cervical adenopathy.  Neurological: He is alert and oriented to person, place, and time.  Skin: Skin is warm and dry. Capillary refill takes less than 2 seconds.  Nursing note and  vitals reviewed.    ED Treatments / Results  Labs (all labs ordered are listed, but only abnormal results are displayed) Labs Reviewed  BASIC METABOLIC PANEL - Abnormal; Notable for the following:       Result Value   BUN 22 (*)    All other components within normal limits  CBC  TROPONIN I  BRAIN NATRIURETIC PEPTIDE  D-DIMER, QUANTITATIVE (NOT AT [facility])    EKG  EKG Interpretation  Date/Time:  Tuesday October 04 2016 16:49:20 EDT Ventricular Rate:  76 PR Interval:  190 QRS Duration: 142 QT Interval:  462 QTC Calculation: 519 R Axis:   -  68 Text Interpretation:  Normal sinus rhythm Right bundle branch block Left anterior fascicular block  Bifascicular block  Anteroseptal infarct , age undetermined Abnormal ECG No significant change since last tracing Confirmed by BEATON  MD, ROBERT [phone]) on 10/04/2016 8:58:19 PM       Radiology Dg Chest 2 View  Result Date: 10/04/2016 CLINICAL DATA:  Onset of productive cough this past Sunday which has increased in severity. History of asthma, previous CABG, morbid obesity, former smoker. EXAM: CHEST  2 VIEW COMPARISON:  PA and lateral chest x-ray of May 27, 2015 FINDINGS: The lungs are well-expanded. There is no focal infiltrate. There is no pleural effusion. The cardiac silhouette is enlarged. The pulmonary vascularity is mildly engorged. There is calcification in the wall of the aortic arch. The 6 sternal wires are intact. There is a left atrial appendage clip. The observed bony thorax is unremarkable. IMPRESSION: Chronic bronchitic changes. Mild stable cardiomegaly with central pulmonary vascular congestion may indicate low-grade compensated CHF. Aortic atherosclerosis. Electronically Signed   By: David  [place] M.D.   On: 10/04/2016 15:29    Procedures Procedures (including critical care time)  Medications Ordered in ED Medications  albuterol (PROVENTIL) (2.5 MG/3ML) 0.083% nebulizer solution 5 mg (not administered)  furosemide  (LASIX) injection 40 mg (40 mg Intravenous Given 10/04/16 2127)     Initial Impression / Assessment and Plan / ED Course  I have reviewed the triage vital signs and the nursing notes.  Pertinent labs & imaging results that were available during my care of the patient were reviewed by me and considered in my medical decision making (see chart for details).  Clinical Course  Patient presented to ED with cough and shortness of breath. He was evaluated at urgent care with a chest x-ray with mild pulmonary congestion. He was sent to ED for further evaluation. Patient denies any chest pain. EKG showed right bundle branch block. Dr. Radford Pax reviewed EKG without any acute changes.  Troponin was negative in the ED. He has had no cp. Low suspicion for ACS. His BNP was normal in ED. D-dimer was negative. Low suspicion for PE. This is not likely an acute chf exacerbation given normal BNP. He has been satting 97% on room air. He was given iv lasix in Ed. I have encouraged patient to increase his Lasix dose 3 times a day for 2 days and then resume regular dose. I also urged patient to follow-up with his PCP tomorrow for reevaluation. Patient was understanding the plan of care. States that he feels much improved. Patient was seen and evaluated with Dr. Radford Pax who agrees with the above plan. Patient is hemodynamically stable. Discharged home in no acute distress with stable vital signs. Strict return precautions discussed.  Final Clinical Impressions(s) / ED Diagnoses   Final diagnoses:  SOB (shortness of breath)  Cough    New Prescriptions Discharge Medication List as of 10/04/2016 11:40 PM    START taking these medications   Details  benzonatate (TESSALON) 100 MG capsule Take 1 capsule (100 mg total) by mouth every 8 (eight) hours., Starting Tue 10/04/2016, Print         Rise Mu, PA-C 10/05/16 [id number]    Nelva Nay, MD 10/15/16 Rickey Primus

## 2016-10-04 NOTE — ED Triage Notes (Signed)
Pt states he has been having a hard time sleeping/feeling SOB and wheezing. Pt has had a cough as well. Pt denies increased swelling. Denies CP

## 2016-10-11 ENCOUNTER — Encounter (HOSPITAL_COMMUNITY): Payer: Self-pay | Admitting: Emergency Medicine

## 2016-10-11 ENCOUNTER — Emergency Department (HOSPITAL_COMMUNITY)
Admission: EM | Admit: 2016-10-11 | Discharge: 2016-10-11 | Disposition: A | Discharge status: Home / Self Care | Payer: Medicare Other | Attending: Emergency Medicine | Admitting: Emergency Medicine

## 2016-10-11 ENCOUNTER — Emergency Department (HOSPITAL_COMMUNITY): Payer: Medicare Other

## 2016-10-11 DIAGNOSIS — Z87891 Personal history of nicotine dependence: Secondary | ICD-10-CM | POA: Insufficient documentation

## 2016-10-11 DIAGNOSIS — I5023 Acute on chronic systolic (congestive) heart failure: Secondary | ICD-10-CM | POA: Insufficient documentation

## 2016-10-11 DIAGNOSIS — Z7982 Long term (current) use of aspirin: Secondary | ICD-10-CM | POA: Diagnosis not present

## 2016-10-11 DIAGNOSIS — Z7901 Long term (current) use of anticoagulants: Secondary | ICD-10-CM | POA: Diagnosis not present

## 2016-10-11 DIAGNOSIS — Z951 Presence of aortocoronary bypass graft: Secondary | ICD-10-CM | POA: Diagnosis not present

## 2016-10-11 DIAGNOSIS — Z79899 Other long term (current) drug therapy: Secondary | ICD-10-CM | POA: Insufficient documentation

## 2016-10-11 DIAGNOSIS — R05 Cough: Secondary | ICD-10-CM

## 2016-10-11 DIAGNOSIS — B9789 Other viral agents as the cause of diseases classified elsewhere: Secondary | ICD-10-CM

## 2016-10-11 DIAGNOSIS — I110 Hypertensive heart disease with heart failure: Secondary | ICD-10-CM | POA: Insufficient documentation

## 2016-10-11 DIAGNOSIS — R0602 Shortness of breath: Secondary | ICD-10-CM | POA: Diagnosis not present

## 2016-10-11 DIAGNOSIS — J069 Acute upper respiratory infection, unspecified: Secondary | ICD-10-CM

## 2016-10-11 DIAGNOSIS — R059 Cough, unspecified: Secondary | ICD-10-CM

## 2016-10-11 MED ORDER — BENZONATATE 100 MG PO CAPS: 100.0000 mg | ORAL_CAPSULE | Freq: Three times a day (TID) | ORAL | 0 refills | Status: DC

## 2016-10-11 NOTE — ED Provider Notes (Signed)
[facility]-EMERGENCY DEPT Provider Note   CSN: [id number] Arrival date & time: 10/11/16  1639     History   Chief Complaint Chief Complaint  Patient presents with  . Cough    HPI Charles Mckinney is a 65 y.o. male past medical history of atrial fibrillation, CHF, HTN, HLD, obesity presents the ED today complaining of cough and congestion. Patient states that for the last week he has been experiencing productive cough. Patient states that sometimes he coughs so hard that he feel like he cannot breathe. Patient reports associated rhinorrhea, sinus congestion and pain in his throat due to all the coughing. Patient was seen and evaluated in the ED for similar symptoms 1 week ago. At discharge she was given Tessalon which she states significantly improved his symptoms but he has since run out of that medication. Patient is also requesting a work note. He denies any chest pain, fever, lower extremity swelling. He reports compliance with his Lasix. He has a scheduled follow-up appointment with his primary care doctor in 2 days.  HPI  Past Medical History:  Diagnosis Date  . A-fib (HCC)   . Anxiety   . CHF (congestive heart failure) (HCC)   . Dysrhythmia    ATRIAL FIBRILATION  . GERD (gastroesophageal reflux disease)   . Hyperlipidemia   . Hypertension   . Hypertensive heart disease   . Kidney stones   . Obesity (BMI 30-39.9)   . Osteoarthritis   . PONV (postoperative nausea and vomiting)   . Shortness of breath dyspnea     Patient Active Problem List   Diagnosis Date Noted  . 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 Acute on chronic systolic heart failure (HCC) 04/05/2015  . Acute systolic heart failure (HCC) 03/31/2015  . Acute on chronic systolic congestive heart failure (HCC) 01/19/2015  . Chronic systolic heart failure (HCC) 01/19/2015  . 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 Long-term (current) use of anticoagulants 01/19/2015  . Paroxysmal atrial fibrillation (HCC)   .  GERD 11/22/2007  . Hyperlipidemia   . Morbid obesity (HCC)   . Hypertensive heart disease     Past Surgical History:  Procedure Laterality Date  . CARDIAC CATHETERIZATION N/A 04/09/2015   Procedure: Right/Left Heart Cath and Coronary Angiography;  Surgeon: Orpah Cobb, MD;  [facility];  Service: Cardiovascular;  Laterality: N/A;  . CARDIOVERSION N/A 01/19/2015   Procedure: CARDIOVERSION;  Surgeon: Othella Boyer, MD;  Location: [facility] ENDOSCOPY;  Service: Cardiovascular;  Laterality: N/A;  pt in Afib, 12:22 synched cardioversion @  120 joules using Propofol 100 mg,IV....unsuccessful, repeated at 200 joules  . CARDIOVERSION N/A 04/02/2015   Procedure: CARDIOVERSION;  Surgeon: Orpah Cobb, MD;  Location: [facility] ENDOSCOPY;  Service: Cardiovascular;  Laterality: N/A;  . CATARACT EXTRACTION    . CLIPPING OF ATRIAL APPENDAGE  04/13/2015   Procedure: CLIPPING OF ATRIAL APPENDAGE;  Surgeon: Alleen Borne, MD;  Location: [facility] OR;  Service: Open Heart Surgery;;  . CORONARY ARTERY BYPASS GRAFT N/A 04/13/2015   Procedure: CORONARY ARTERY BYPASS GRAFTING (CABG);  Surgeon: Alleen Borne, MD;  Location: [facility] OR;  Service: Open Heart Surgery;  Laterality: N/A;  . TEE WITHOUT CARDIOVERSION N/A 04/13/2015   Procedure: TRANSESOPHAGEAL ECHOCARDIOGRAM (TEE);  Surgeon: Alleen Borne, MD;  Location: [facility] OR;  Service: Open Heart Surgery;  Laterality: N/A;       Home Medications    Prior to Admission medications   Medication Sig Start Date End Date  Taking? Authorizing Provider  acetaminophen (TYLENOL) 500 MG tablet Take 1,000 mg by mouth every 6 (six) hours as needed for moderate pain. Reported on 03/11/2016   Yes Historical Provider, MD  albuterol (PROAIR HFA) 108 (90 BASE) MCG/ACT inhaler Inhale 1 puff into the lungs every 6 (six) hours as needed for wheezing or shortness of breath. 09/25/15  Yes [name], MD  allopurinol (ZYLOPRIM) 100 MG tablet Take 1 tablet (100 mg total) by mouth daily.  09/25/15  Yes [name], MD  amiodarone (PACERONE) 200 MG tablet Take 1 tablet (200 mg total) by mouth daily. 09/25/15  Yes [name], MD  aspirin 81 MG EC tablet Take 1 tablet (81 mg total) by mouth daily. Swallow whole. Patient taking differently: Take 81 mg by mouth daily as needed for pain. Swallow whole. 09/03/12  Yes [name], MD  benzonatate (TESSALON) 100 MG capsule Take 1 capsule (100 mg total) by mouth every 8 (eight) hours. 10/04/16  Yes Iantha Fallen T Leaphart, PA-C  carvedilol (COREG) 6.25 MG tablet TAKE ONE TABLET BY MOUTH TWICE DAILY WITH  A  MEAL. 02/04/16  Yes Brittainy Sherlynn Carbon, PA-C  fluticasone (FLONASE) 50 MCG/ACT nasal spray Place 1 spray into both nostrils daily.   Yes Historical Provider, MD  furosemide (LASIX) 40 MG tablet Take 1 tablet (40 mg total) by mouth 2 (two) times daily. 09/25/15  Yes [name], MD  GuaiFENesin (MUCINEX PO) Take 30 mLs by mouth daily as needed.   Yes Historical Provider, MD  lisinopril (PRINIVIL,ZESTRIL) 2.5 MG tablet Take 1 tablet (2.5 mg total) by mouth daily. 09/25/15  Yes [name], MD  potassium chloride SA (K-DUR,KLOR-CON) 20 MEQ tablet Take 1 tablet (20 mEq total) by mouth daily. 09/25/15  Yes [name], MD  pravastatin (PRAVACHOL) 40 MG tablet Take 1 tablet (40 mg total) by mouth daily. 09/25/15  Yes [name], MD  rivaroxaban (XARELTO) 20 MG TABS tablet Take 1 tablet (20 mg total) by mouth daily with supper. 09/25/15  Yes [name], MD  carvedilol (COREG) 6.25 MG tablet TAKE 1 TABLET(6.25 MG) BY MOUTH TWICE DAILY WITH A MEAL Patient not taking: Reported on 10/11/2016 07/18/16   [name], MD  clotrimazole (LOTRIMIN) 1 % cream Apply 1 application topically 2 (two) times daily. Patient not taking: Reported on 10/11/2016 09/25/15   [name], MD  ondansetron (ZOFRAN ODT) 4 MG disintegrating tablet 4mg  ODT q4 hours prn nausea/vomit Patient not taking:  Reported on 10/11/2016 09/21/15   Mirian Mo, MD  traMADol (ULTRAM) 50 MG tablet Take 1 tablet (50 mg total) by mouth every 8 (eight) hours as needed. Patient not taking: Reported on 10/11/2016 03/11/16   Shelva Majestic, MD  valACYclovir (VALTREX) 1000 MG tablet Take 1 tablet (1,000 mg total) by mouth 3 (three) times daily. Patient not taking: Reported on 10/11/2016 03/11/16   Shelva Majestic, MD    Family History Family History  Problem Relation Age of Onset  . Diabetes Mother   . Heart disease Father   . Stroke Brother   . Heart disease Brother   . Diabetes Brother     Social History Social History  Substance Use Topics  . Smoking status: Former Games developer  . Smokeless tobacco: Never Used  . Alcohol use No     Allergies   Niacin and related and Penicillins   Review of Systems Review of Systems  All other systems reviewed and are negative.  Physical Exam Updated Vital Signs BP 112/83 (BP Location: Right Arm)   Pulse 80   Temp 98.6 F (37 C) (Oral)   Resp 16   SpO2 98%   Physical Exam  Constitutional: He is oriented to person, place, and time. He appears well-developed and well-nourished. No distress.  HENT:  Head: Normocephalic and atraumatic.  Mouth/Throat: No oropharyngeal exudate.  Eyes: Conjunctivae and EOM are normal. Pupils are equal, round, and reactive to light. Right eye exhibits no discharge. Left eye exhibits no discharge. No scleral icterus.  Cardiovascular: Normal rate, regular rhythm, normal heart sounds and intact distal pulses.  Exam reveals no gallop and no friction rub.   No murmur heard. Pulmonary/Chest: Effort normal and breath sounds normal. No respiratory distress. He has no wheezes. He has no rales. He exhibits no tenderness.  Abdominal: Soft. He exhibits no distension. There is no tenderness. There is no guarding.  Musculoskeletal: Normal range of motion. He exhibits edema ( 1+ bilaterally).  Neurological: He is alert and oriented to  person, place, and time.  Skin: Skin is warm and dry. No rash noted. He is not diaphoretic. No erythema. No pallor.  Psychiatric: He has a normal mood and affect. His behavior is normal.  Nursing note and vitals reviewed.    ED Treatments / Results  Labs (all labs ordered are listed, but only abnormal results are displayed) Labs Reviewed - No data to display  EKG  EKG Interpretation None       Radiology Dg Chest 2 View  Result Date: 10/11/2016 CLINICAL DATA:  Shortness of breath and productive cough EXAM: CHEST  2 VIEW COMPARISON:  10/04/2016 FINDINGS: Cardiac shadow is mildly enlarged but stable. Postsurgical changes are again seen. The lungs are well aerated bilaterally. No focal infiltrate is noted. Degenerative changes of the thoracic spine are again seen. IMPRESSION: No active cardiopulmonary disease. Electronically Signed   By: Alcide CleverMark  Lukens M.D.   On: 10/11/2016 18:36    Procedures Procedures (including critical care time)  Medications Ordered in ED Medications - No data to display   Initial Impression / Assessment and Plan / ED Course  I have reviewed the triage vital signs and the nursing notes.  Pertinent labs & imaging results that were available during my care of the patient were reviewed by me and considered in my medical decision making (see chart for details).  Clinical Course     65 year old male with multiple comorbidities otherwise stated in the history of present illness presents to the ED today complaining of ongoing cough 1 week. Patient was evaluated in the ED 1 week ago with similar symptoms. He had complete workup including chest x-ray, BNP, CBC, CMP and d-dimer which was all unremarkable. Patient was discharged with Tessalon pearls which she states improved his symptoms but since he ran out of his symptoms have returned. On presentation ED, patient appears well in all vital signs are stable. His lungs are clear to auscultation bilaterally. Vital signs  stable, afebrile. Chest x-ray repeated again today which is again unremarkable. Given the patient recently had complete workup do not feel it is indicated to repeat at this time. Patient is requesting a work note. We will discharge with cough medication. He has a scheduled appointment with his PCP in 2 days. Return precautions outlined in patient discharge instructions.  Case discussed with Dr. Rosalia Hammersay who agrees with treatment plan.  Final Clinical Impressions(s) / ED Diagnoses   Final diagnoses:  None    New Prescriptions New  Prescriptions   No medications on file     Lester Kinsman [place], PA-C 10/12/16 1846    Margarita Grizzle, MD 10/20/16 1511

## 2016-10-11 NOTE — ED Triage Notes (Signed)
Pt sts continued cough x 1 week; pt seen here for same last week

## 2016-10-11 NOTE — Discharge Instructions (Signed)
Take Tessalon as needed for cough. Recommend hot tea, OTC mucinex and humidifier for help with congestion. Drink plenty of fluids. Keep scheduled appointment with your primary care provider in 2 days. Return to the ED if you experience chest pain, fever, difficulty breathing.

## 2016-10-13 ENCOUNTER — Encounter: Payer: Self-pay | Admitting: Internal Medicine

## 2016-10-13 ENCOUNTER — Ambulatory Visit (INDEPENDENT_AMBULATORY_CARE_PROVIDER_SITE_OTHER): Payer: Medicare Other | Admitting: Internal Medicine

## 2016-10-13 VITALS — BP 126/80 | HR 87 | Temp 98.1°F | Resp 20 | Ht 70.0 in | Wt 320.5 lb

## 2016-10-13 DIAGNOSIS — I5022 Chronic systolic (congestive) heart failure: Secondary | ICD-10-CM

## 2016-10-13 DIAGNOSIS — I119 Hypertensive heart disease without heart failure: Secondary | ICD-10-CM

## 2016-10-13 DIAGNOSIS — Z951 Presence of aortocoronary bypass graft: Secondary | ICD-10-CM | POA: Diagnosis not present

## 2016-10-13 NOTE — Progress Notes (Signed)
Subjective:    Patient ID: Charles Mckinney, male    DOB: 12/26/1950, 65 y.o.   MRN: [id number]  HPI   65 year old patient who is seen today in follow-up.  The patient has had 2 ED visits on October 31 and again 2 days ago.   ED records reviewed.   Chest x-rays have revealed no acute disease and mild cardiomegaly only.  He does have a history of chronic systolic heart failure and ischemic cardiomyopathy.  He is status post CABG.  Evaluation included a normal d-dimer and BNP.   He continues to have cough which is productive of small amounts of clear to yellow tan sputum.  No active wheezing.  He states that since the onset of his acute illness.  He has been able to work only intermittently.  He is requesting another note for work to excuse due to illness.   Denies any fever, chills or shortness of breath.  He does state that he becomes slightly dizzy associated with cough.   Medical treatment includes expectorants and antitussives.    He has been followed at the [facility] cardiology clinic and is requesting referral to a local cardiologist  Past Medical History:  Diagnosis Date  . A-fib (HCC)   . Anxiety   . CHF (congestive heart failure) (HCC)   . Dysrhythmia    ATRIAL FIBRILATION  . GERD (gastroesophageal reflux disease)   . Hyperlipidemia   . Hypertension   . Hypertensive heart disease   . Kidney stones   . Obesity (BMI 30-39.9)   . Osteoarthritis   . PONV (postoperative nausea and vomiting)   . Shortness of breath dyspnea      Social History   Social History  . Marital status: Widowed    Spouse name: N/A  . Number of children: N/A  . Years of education: N/A   Occupational History  . Not on file.   Social History Main Topics  . Smoking status: Former Games developer  . Smokeless tobacco: Never Used  . Alcohol use No  . Drug use: No  . Sexual activity: Yes   Other Topics Concern  . Not on file   Social History Narrative  . No narrative on file    Past Surgical History:    Procedure Laterality Date  . CARDIAC CATHETERIZATION N/A 04/09/2015   Procedure: Right/Left Heart Cath and Coronary Angiography;  Surgeon: Orpah Cobb, MD;  [facility]: [facility];  Service: Cardiovascular;  Laterality: N/A;  . CARDIOVERSION N/A 01/19/2015   Procedure: CARDIOVERSION;  Surgeon: Othella Boyer, MD;  Location: [facility] ENDOSCOPY;  Service: Cardiovascular;  Laterality: N/A;  pt in Afib, 12:22 synched cardioversion @  120 joules using Propofol 100 mg,IV....unsuccessful, repeated at 200 joules  . CARDIOVERSION N/A 04/02/2015   Procedure: CARDIOVERSION;  Surgeon: Orpah Cobb, MD;  Location: [facility] ENDOSCOPY;  Service: Cardiovascular;  Laterality: N/A;  . CATARACT EXTRACTION    . CLIPPING OF ATRIAL APPENDAGE  04/13/2015   Procedure: CLIPPING OF ATRIAL APPENDAGE;  Surgeon: Alleen Borne, MD;  Location: [facility] OR;  Service: Open Heart Surgery;;  . CORONARY ARTERY BYPASS GRAFT N/A 04/13/2015   Procedure: CORONARY ARTERY BYPASS GRAFTING (CABG);  Surgeon: Alleen Borne, MD;  Location: [facility] OR;  Service: Open Heart Surgery;  Laterality: N/A;  . TEE WITHOUT CARDIOVERSION N/A 04/13/2015   Procedure: TRANSESOPHAGEAL ECHOCARDIOGRAM (TEE);  Surgeon: Alleen Borne, MD;  Location: [facility] OR;  Service: Open Heart Surgery;  Laterality: N/A;    Family History  Problem Relation Age of Onset  . Diabetes Mother   . Heart disease Father   . Stroke Brother   . Heart disease Brother   . Diabetes Brother     Allergies  Allergen Reactions  . Niacin And Related Other (See Comments)    FLUSHING  . Penicillins Itching and Rash    Has patient had a PCN reaction causing immediate rash, facial/tongue/throat swelling, SOB or lightheadedness with hypotension:YES Has patient had a PCN reaction causing severe rash involving mucus membranes or skin necrosis: NO Has patient had a PCN reaction that required hospitalization NO Has patient had a PCN reaction occurring within the last 10 years: NO If all of the above  answers are "NO", then may proceed with Cephalosporin use.    Current Outpatient Prescriptions on File Prior to Visit  Medication Sig Dispense Refill  . acetaminophen (TYLENOL) 500 MG tablet Take 1,000 mg by mouth every 6 (six) hours as needed for moderate pain. Reported on 03/11/2016    . albuterol (PROAIR HFA) 108 (90 BASE) MCG/ACT inhaler Inhale 1 puff into the lungs every 6 (six) hours as needed for wheezing or shortness of breath. 1 Inhaler 5  . allopurinol (ZYLOPRIM) 100 MG tablet Take 1 tablet (100 mg total) by mouth daily. 90 tablet 3  . amiodarone (PACERONE) 200 MG tablet Take 1 tablet (200 mg total) by mouth daily. 90 tablet 3  . aspirin 81 MG EC tablet Take 1 tablet (81 mg total) by mouth daily. Swallow whole. (Patient taking differently: Take 81 mg by mouth daily as needed for pain. Swallow whole.) 30 tablet 12  . benzonatate (TESSALON) 100 MG capsule Take 1 capsule (100 mg total) by mouth every 8 (eight) hours. 12 capsule 0  . carvedilol (COREG) 6.25 MG tablet TAKE 1 TABLET(6.25 MG) BY MOUTH TWICE DAILY WITH A MEAL 90 tablet 0  . clotrimazole (LOTRIMIN) 1 % cream Apply 1 application topically 2 (two) times daily. 30 g 1  . fluticasone (FLONASE) 50 MCG/ACT nasal spray Place 1 spray into both nostrils daily.    . furosemide (LASIX) 40 MG tablet Take 1 tablet (40 mg total) by mouth 2 (two) times daily. 180 tablet 3  . GuaiFENesin (MUCINEX PO) Take 30 mLs by mouth daily as needed.    Marland Kitchen. lisinopril (PRINIVIL,ZESTRIL) 2.5 MG tablet Take 1 tablet (2.5 mg total) by mouth daily. 90 tablet 3  . ondansetron (ZOFRAN ODT) 4 MG disintegrating tablet 4mg  ODT q4 hours prn nausea/vomit 15 tablet 0  . potassium chloride SA (K-DUR,KLOR-CON) 20 MEQ tablet Take 1 tablet (20 mEq total) by mouth daily. 90 tablet 3  . pravastatin (PRAVACHOL) 40 MG tablet Take 1 tablet (40 mg total) by mouth daily. 90 tablet 3  . rivaroxaban (XARELTO) 20 MG TABS tablet Take 1 tablet (20 mg total) by mouth daily with supper. 30  tablet 12  . traMADol (ULTRAM) 50 MG tablet Take 1 tablet (50 mg total) by mouth every 8 (eight) hours as needed. 30 tablet 0  . valACYclovir (VALTREX) 1000 MG tablet Take 1 tablet (1,000 mg total) by mouth 3 (three) times daily. 21 tablet 0   No current facility-administered medications on file prior to visit.     BP 126/80 (BP Location: Left Arm, Patient Position: Sitting, Cuff Size: Large)   Pulse 87   Temp 98.1 F (36.7 C) (Oral)   Resp 20   Ht 5\' 10"  (1.778 m)   Wt (!) 320 lb 8 oz (145.4 kg)  SpO2 97%   BMI 45.99 kg/m     Review of Systems  Constitutional: Positive for activity change, appetite change and fatigue. Negative for chills and fever.  HENT: Positive for congestion. Negative for dental problem, ear pain, hearing loss, sore throat, tinnitus, trouble swallowing and voice change.   Eyes: Negative for pain, discharge and visual disturbance.  Respiratory: Positive for cough. Negative for chest tightness, wheezing and stridor.   Cardiovascular: Negative for chest pain, palpitations and leg swelling.  Gastrointestinal: Negative for abdominal distention, abdominal pain, blood in stool, constipation, diarrhea, nausea and vomiting.  Genitourinary: Negative for difficulty urinating, discharge, flank pain, genital sores, hematuria and urgency.  Musculoskeletal: Negative for arthralgias, back pain, gait problem, joint swelling, myalgias and neck stiffness.  Skin: Negative for rash.  Neurological: Negative for dizziness, syncope, speech difficulty, weakness, numbness and headaches.  Hematological: Negative for adenopathy. Does not bruise/bleed easily.  Psychiatric/Behavioral: Negative for behavioral problems and dysphoric mood. The patient is not nervous/anxious.        Objective:   Physical Exam  Constitutional: He is oriented to person, place, and time. He appears well-developed.  Obese No distress O2 sats ration 97 concern 90 8.1, pulse 87    HENT:  Head:  Normocephalic.  Right Ear: External ear normal.  Left Ear: External ear normal.  Eyes: Conjunctivae and EOM are normal.  Neck: Normal range of motion.  Cardiovascular: Normal rate and normal heart sounds.   Pulmonary/Chest: Effort normal.  A few bibasilar crackles no tachycardia  Abdominal: Bowel sounds are normal.  Musculoskeletal: Normal range of motion. He exhibits no edema or tenderness.  Neurological: He is alert and oriented to person, place, and time.  Psychiatric: He has a normal mood and affect. His behavior is normal.          Assessment & Plan:  .  Viral URI with cough .  Continue symptomatic treatment with antitussives and expectorants.  A note was written on his behalf to excuse from work until next week, will rest and force fluids  .  Coronary artery disease status post CABG with ischemic cardiomyopathy , compensated chronic  Systolic heart failure.  Will set up for local cardiology follow-up  .  Return here 6 months for annual exam  Rogelia Boga

## 2016-10-13 NOTE — Patient Instructions (Signed)
Limit your sodium (Salt) intake  Acute bronchitis symptoms for less than 10 days are generally not helped by antibiotics.  Take over-the-counter expectorants and cough medications such as  Mucinex DM.  Call if there is no improvement in 5 to 7 days or if  you develop worsening cough, fever, or new symptoms, such as shortness of breath or chest pain.  Return in 6 months for follow-up

## 2016-10-13 NOTE — Progress Notes (Signed)
Pre visit review using our clinic review tool, if applicable. No additional management support is needed unless otherwise documented below in the visit note. 

## 2016-10-28 ENCOUNTER — Other Ambulatory Visit: Payer: Self-pay | Admitting: Internal Medicine

## 2016-10-28 DIAGNOSIS — I5022 Chronic systolic (congestive) heart failure: Secondary | ICD-10-CM

## 2016-11-01 DIAGNOSIS — I5022 Chronic systolic (congestive) heart failure: Secondary | ICD-10-CM | POA: Diagnosis not present

## 2016-11-01 DIAGNOSIS — R072 Precordial pain: Secondary | ICD-10-CM | POA: Diagnosis not present

## 2016-11-01 DIAGNOSIS — I482 Chronic atrial fibrillation: Secondary | ICD-10-CM | POA: Diagnosis not present

## 2016-11-07 DIAGNOSIS — R072 Precordial pain: Secondary | ICD-10-CM | POA: Diagnosis not present

## 2016-11-07 DIAGNOSIS — I482 Chronic atrial fibrillation: Secondary | ICD-10-CM | POA: Diagnosis not present

## 2016-11-07 DIAGNOSIS — I5022 Chronic systolic (congestive) heart failure: Secondary | ICD-10-CM | POA: Diagnosis not present

## 2016-11-11 ENCOUNTER — Other Ambulatory Visit: Payer: Self-pay | Admitting: Internal Medicine

## 2016-12-08 DIAGNOSIS — N429 Disorder of prostate, unspecified: Secondary | ICD-10-CM | POA: Diagnosis not present

## 2016-12-08 DIAGNOSIS — E876 Hypokalemia: Secondary | ICD-10-CM | POA: Diagnosis not present

## 2016-12-08 DIAGNOSIS — E785 Hyperlipidemia, unspecified: Secondary | ICD-10-CM | POA: Diagnosis not present

## 2016-12-08 DIAGNOSIS — D649 Anemia, unspecified: Secondary | ICD-10-CM | POA: Diagnosis not present

## 2016-12-13 ENCOUNTER — Ambulatory Visit (INDEPENDENT_AMBULATORY_CARE_PROVIDER_SITE_OTHER)
Admission: EM | Admit: 2016-12-13 | Discharge: 2016-12-13 | Disposition: A | Discharge status: Nursing Home (Includes ICF) | Payer: Medicare Other | Source: Home / Self Care | Attending: Family Medicine | Admitting: Family Medicine

## 2016-12-13 ENCOUNTER — Ambulatory Visit (INDEPENDENT_AMBULATORY_CARE_PROVIDER_SITE_OTHER): Payer: Medicare Other

## 2016-12-13 ENCOUNTER — Encounter (HOSPITAL_COMMUNITY): Payer: Self-pay | Admitting: *Deleted

## 2016-12-13 ENCOUNTER — Encounter (HOSPITAL_COMMUNITY): Payer: Self-pay

## 2016-12-13 ENCOUNTER — Inpatient Hospital Stay (HOSPITAL_COMMUNITY)
Admission: EM | Admit: 2016-12-13 | Discharge: 2016-12-15 | DRG: 292 | Disposition: A | Discharge status: Home / Self Care | Payer: Medicare Other | Attending: Internal Medicine | Admitting: Internal Medicine

## 2016-12-13 DIAGNOSIS — Z888 Allergy status to other drugs, medicaments and biological substances status: Secondary | ICD-10-CM

## 2016-12-13 DIAGNOSIS — E876 Hypokalemia: Secondary | ICD-10-CM | POA: Diagnosis not present

## 2016-12-13 DIAGNOSIS — Z6841 Body Mass Index (BMI) 40.0 and over, adult: Secondary | ICD-10-CM | POA: Diagnosis not present

## 2016-12-13 DIAGNOSIS — I110 Hypertensive heart disease with heart failure: Principal | ICD-10-CM | POA: Diagnosis present

## 2016-12-13 DIAGNOSIS — Z7982 Long term (current) use of aspirin: Secondary | ICD-10-CM | POA: Diagnosis not present

## 2016-12-13 DIAGNOSIS — I480 Paroxysmal atrial fibrillation: Secondary | ICD-10-CM | POA: Diagnosis not present

## 2016-12-13 DIAGNOSIS — I509 Heart failure, unspecified: Secondary | ICD-10-CM

## 2016-12-13 DIAGNOSIS — Z7901 Long term (current) use of anticoagulants: Secondary | ICD-10-CM

## 2016-12-13 DIAGNOSIS — Z833 Family history of diabetes mellitus: Secondary | ICD-10-CM | POA: Diagnosis not present

## 2016-12-13 DIAGNOSIS — Z823 Family history of stroke: Secondary | ICD-10-CM

## 2016-12-13 DIAGNOSIS — Z79899 Other long term (current) drug therapy: Secondary | ICD-10-CM

## 2016-12-13 DIAGNOSIS — Z7951 Long term (current) use of inhaled steroids: Secondary | ICD-10-CM

## 2016-12-13 DIAGNOSIS — I255 Ischemic cardiomyopathy: Secondary | ICD-10-CM | POA: Diagnosis present

## 2016-12-13 DIAGNOSIS — R739 Hyperglycemia, unspecified: Secondary | ICD-10-CM | POA: Diagnosis present

## 2016-12-13 DIAGNOSIS — E785 Hyperlipidemia, unspecified: Secondary | ICD-10-CM | POA: Diagnosis not present

## 2016-12-13 DIAGNOSIS — Z87891 Personal history of nicotine dependence: Secondary | ICD-10-CM

## 2016-12-13 DIAGNOSIS — R0902 Hypoxemia: Secondary | ICD-10-CM | POA: Diagnosis present

## 2016-12-13 DIAGNOSIS — I119 Hypertensive heart disease without heart failure: Secondary | ICD-10-CM | POA: Diagnosis present

## 2016-12-13 DIAGNOSIS — Z8249 Family history of ischemic heart disease and other diseases of the circulatory system: Secondary | ICD-10-CM | POA: Diagnosis not present

## 2016-12-13 DIAGNOSIS — Z951 Presence of aortocoronary bypass graft: Secondary | ICD-10-CM

## 2016-12-13 DIAGNOSIS — R0602 Shortness of breath: Secondary | ICD-10-CM

## 2016-12-13 DIAGNOSIS — T501X5A Adverse effect of loop [high-ceiling] diuretics, initial encounter: Secondary | ICD-10-CM | POA: Diagnosis not present

## 2016-12-13 DIAGNOSIS — I2510 Atherosclerotic heart disease of native coronary artery without angina pectoris: Secondary | ICD-10-CM | POA: Diagnosis present

## 2016-12-13 DIAGNOSIS — K219 Gastro-esophageal reflux disease without esophagitis: Secondary | ICD-10-CM | POA: Diagnosis not present

## 2016-12-13 DIAGNOSIS — I4819 Other persistent atrial fibrillation: Secondary | ICD-10-CM | POA: Diagnosis present

## 2016-12-13 DIAGNOSIS — I5043 Acute on chronic combined systolic (congestive) and diastolic (congestive) heart failure: Secondary | ICD-10-CM | POA: Diagnosis present

## 2016-12-13 DIAGNOSIS — I5023 Acute on chronic systolic (congestive) heart failure: Secondary | ICD-10-CM | POA: Diagnosis not present

## 2016-12-13 DIAGNOSIS — Z880 Allergy status to penicillin: Secondary | ICD-10-CM

## 2016-12-13 DIAGNOSIS — I5022 Chronic systolic (congestive) heart failure: Secondary | ICD-10-CM

## 2016-12-13 DIAGNOSIS — R05 Cough: Secondary | ICD-10-CM | POA: Diagnosis not present

## 2016-12-13 LAB — CBC WITH DIFFERENTIAL/PLATELET
BASOS ABS: 0 10*3/uL (ref 0.0–0.1)
Basophils Relative: 1 %
EOS ABS: 0.2 10*3/uL (ref 0.0–0.7)
EOS PCT: 2 %
HCT: 40.3 % (ref 39.0–52.0)
Hemoglobin: 13.2 g/dL (ref 13.0–17.0)
LYMPHS PCT: 19 %
Lymphs Abs: 1.5 10*3/uL (ref 0.7–4.0)
MCH: 29.7 pg (ref 26.0–34.0)
MCHC: 32.8 g/dL (ref 30.0–36.0)
MCV: 90.8 fL (ref 78.0–100.0)
Monocytes Absolute: 0.6 10*3/uL (ref 0.1–1.0)
Monocytes Relative: 7 %
Neutro Abs: 5.9 10*3/uL (ref 1.7–7.7)
Neutrophils Relative %: 71 %
PLATELETS: 210 10*3/uL (ref 150–400)
RBC: 4.44 MIL/uL (ref 4.22–5.81)
RDW: 13.6 % (ref 11.5–15.5)
WBC: 8.1 10*3/uL (ref 4.0–10.5)

## 2016-12-13 LAB — BASIC METABOLIC PANEL
Anion gap: 9 (ref 5–15)
BUN: 18 mg/dL (ref 6–20)
CO2: 24 mmol/L (ref 22–32)
Calcium: 8.9 mg/dL (ref 8.9–10.3)
Chloride: 108 mmol/L (ref 101–111)
Creatinine, Ser: 1.28 mg/dL — ABNORMAL HIGH (ref 0.61–1.24)
GFR calc Af Amer: >60 mL/min (ref >60)
GFR, EST NON AFRICAN AMERICAN: 57 mL/min — AB (ref >60)
Glucose, Bld: 114 mg/dL — ABNORMAL HIGH (ref 65–99)
POTASSIUM: 3.8 mmol/L (ref 3.5–5.1)
SODIUM: 141 mmol/L (ref 135–145)

## 2016-12-13 LAB — I-STAT TROPONIN, ED: TROPONIN I, POC: 0.01 ng/mL (ref 0.00–0.08)

## 2016-12-13 LAB — BRAIN NATRIURETIC PEPTIDE: B NATRIURETIC PEPTIDE 5: 417.5 pg/mL — AB (ref 0.0–100.0)

## 2016-12-13 MED ORDER — CARVEDILOL 6.25 MG PO TABS
6.2500 mg | ORAL_TABLET | Freq: Two times a day (BID) | ORAL | Status: DC
  Administered 2016-12-14 – 2016-12-15 (×3): 6.25 mg via ORAL
  Filled 2016-12-13 (×3): qty 1

## 2016-12-13 MED ORDER — SODIUM CHLORIDE 0.9 % IV SOLN: 250.0000 mL | INTRAVENOUS | Status: DC | PRN

## 2016-12-13 MED ORDER — FUROSEMIDE 10 MG/ML IJ SOLN
40.0000 mg | Freq: Two times a day (BID) | INTRAMUSCULAR | Status: DC
  Administered 2016-12-13 – 2016-12-15 (×4): 40 mg via INTRAVENOUS
  Filled 2016-12-13 (×4): qty 4

## 2016-12-13 MED ORDER — AMIODARONE HCL 200 MG PO TABS
200.0000 mg | ORAL_TABLET | Freq: Every day | ORAL | Status: DC
  Administered 2016-12-14 – 2016-12-15 (×2): 200 mg via ORAL
  Filled 2016-12-13 (×2): qty 1

## 2016-12-13 MED ORDER — RIVAROXABAN 20 MG PO TABS
20.0000 mg | ORAL_TABLET | Freq: Every day | ORAL | Status: DC
  Administered 2016-12-13 – 2016-12-14 (×2): 20 mg via ORAL
  Filled 2016-12-13 (×2): qty 1

## 2016-12-13 MED ORDER — ACETAMINOPHEN 325 MG PO TABS: 650.0000 mg | ORAL_TABLET | ORAL | Status: DC | PRN

## 2016-12-13 MED ORDER — HYDRALAZINE HCL 20 MG/ML IJ SOLN
20.0000 mg | Freq: Once | INTRAMUSCULAR | Status: AC
Start: 2016-12-13 — End: 2016-12-13
  Administered 2016-12-13: 20 mg via INTRAVENOUS
  Filled 2016-12-13: qty 1

## 2016-12-13 MED ORDER — FLUTICASONE PROPIONATE 50 MCG/ACT NA SUSP
1.0000 | Freq: Every day | NASAL | Status: DC
  Administered 2016-12-14 – 2016-12-15 (×2): 1 via NASAL
  Filled 2016-12-13 (×2): qty 16

## 2016-12-13 MED ORDER — SODIUM CHLORIDE 0.9 % IV SOLN
Freq: Once | INTRAVENOUS | Status: AC
  Administered 2016-12-13: 15:00:00 via INTRAVENOUS

## 2016-12-13 MED ORDER — PRAVASTATIN SODIUM 40 MG PO TABS
40.0000 mg | ORAL_TABLET | Freq: Every day | ORAL | Status: DC
  Administered 2016-12-14 – 2016-12-15 (×2): 40 mg via ORAL
  Filled 2016-12-13 (×2): qty 1

## 2016-12-13 MED ORDER — SODIUM CHLORIDE 0.9% FLUSH: 3.0000 mL | INTRAVENOUS | Status: DC | PRN

## 2016-12-13 MED ORDER — ONDANSETRON HCL 4 MG/2ML IJ SOLN: 4.0000 mg | Freq: Four times a day (QID) | INTRAMUSCULAR | Status: DC | PRN

## 2016-12-13 MED ORDER — LISINOPRIL 2.5 MG PO TABS
2.5000 mg | ORAL_TABLET | Freq: Every day | ORAL | Status: DC
  Administered 2016-12-14 – 2016-12-15 (×2): 2.5 mg via ORAL
  Filled 2016-12-13 (×2): qty 1

## 2016-12-13 MED ORDER — FUROSEMIDE 10 MG/ML IJ SOLN
40.0000 mg | Freq: Once | INTRAMUSCULAR | Status: AC
  Administered 2016-12-13: 40 mg via INTRAVENOUS
  Filled 2016-12-13: qty 4

## 2016-12-13 MED ORDER — SODIUM CHLORIDE 0.9% FLUSH
3.0000 mL | Freq: Two times a day (BID) | INTRAVENOUS | Status: DC
  Administered 2016-12-13 – 2016-12-15 (×4): 3 mL via INTRAVENOUS

## 2016-12-13 MED ORDER — ASPIRIN EC 81 MG PO TBEC
81.0000 mg | DELAYED_RELEASE_TABLET | Freq: Every day | ORAL | Status: DC
  Administered 2016-12-14 – 2016-12-15 (×2): 81 mg via ORAL
  Filled 2016-12-13 (×3): qty 1

## 2016-12-13 MED ORDER — POTASSIUM CHLORIDE CRYS ER 20 MEQ PO TBCR
20.0000 meq | EXTENDED_RELEASE_TABLET | Freq: Every day | ORAL | Status: DC
  Administered 2016-12-14: 20 meq via ORAL
  Filled 2016-12-13: qty 1

## 2016-12-13 MED ORDER — ALLOPURINOL 100 MG PO TABS
100.0000 mg | ORAL_TABLET | Freq: Every day | ORAL | Status: DC
  Administered 2016-12-14 – 2016-12-15 (×2): 100 mg via ORAL
  Filled 2016-12-13 (×2): qty 1

## 2016-12-13 NOTE — H&P (Signed)
History and Physical    Charles Mckinney WUJ:[id number] DOB: September 16, 1951 DOA: 12/13/2016  PCP: Rogelia Boga, MD Consultants:  [name] - Cardiology Patient coming from: home - lives alone; NOK: son, [phone]  Chief Complaint: SOB  HPI: Charles Mckinney is a 66 y.o. male with medical history significant of morbid obesity, HTN, HLD, h/o afib, and CHF (EF 30-35% on 4/16) presenting with CHF exacerbation.  He reports h/o CAD s/p CABG 2016.  Recently, he noticed getting tired/SOB quickly.  Last night, SOB.  Was whistling with breathing.  Went to urgent care and had an EKG and they sent an ambulance to bring him here.  Some chest pressure this AM upon awakening, substernal, relieved with his meds.  Continues to feel SOB, requiring O2.  He also had UC/ER visits for similar symptoms but without admission on 10/04/16 and 10/11/16.     ED Course: Per Dr. Roger Shelter: Patient is a 66 year old male with history of CAD status post CABG, CHF, A. fib on Xarelto who presents with 1 day of worsening shortness of breath. Patient woke up in the middle of the night with dyspnea and mild substernal chest pain. His chest pain resolved however the shortness of breath continued throughout the day and has been progressively worsening. He also noticed significant increase in dyspnea on exertion today. Patient went to the urgent care where he was found to have pulmonary edema on chest x-ray and was sent to the emergency room for further evaluation. Next  On exam patient has bibasilar rales and 2+ pitting edema in bilateral lower extremities. Patient likely having CHF exacerbation. Also concern for possible ACS due to his pain episode earlier today. Chest pain may also been secondary to fluid overload.  Patient ordered Lasix 40 mg IV. Basic labs, BNP, troponin ordered as well. Plan disposition for admission to medicine for further management of CHF exacerbation. Patient also on 2 L nasal cannula which is a new O2 requirement.   1st trop neg. BNP >400. CXR shows fluid overload. Admit to tele bed.  Pt seen with attending Dr. Judd Lien.   Review of Systems: As per HPI; otherwise 10 point review of systems reviewed and negative.   Ambulatory Status:  Ambulates short distances without assistance  Past Medical History:  Diagnosis Date  . Anxiety   . CHF (congestive heart failure) (HCC)   . Dysrhythmia    ATRIAL FIBRILATION, resolved with cardioversion  . GERD (gastroesophageal reflux disease)   . Hyperlipidemia   . Hypertension   . Hypertensive heart disease   . Kidney stones   . Obesity (BMI 30-39.9)   . Osteoarthritis   . PONV (postoperative nausea and vomiting)   . Shortness of breath dyspnea     Past Surgical History:  Procedure Laterality Date  . CARDIAC CATHETERIZATION N/A 04/09/2015   Procedure: Right/Left Heart Cath and Coronary Angiography;  Surgeon: Orpah Cobb, MD;  Location: [facility] INVASIVE CV LAB CUPID;  Service: Cardiovascular;  Laterality: N/A;  . CARDIOVERSION N/A 01/19/2015   Procedure: CARDIOVERSION;  Surgeon: Othella Boyer, MD;  Location: [facility] ENDOSCOPY;  Service: Cardiovascular;  Laterality: N/A;  pt in Afib, 12:22 synched cardioversion @  120 joules using Propofol 100 mg,IV....unsuccessful, repeated at 200 joules  . CARDIOVERSION N/A 04/02/2015   Procedure: CARDIOVERSION;  Surgeon: Orpah Cobb, MD;  Location: [facility] ENDOSCOPY;  Service: Cardiovascular;  Laterality: N/A;  . CATARACT EXTRACTION    . CLIPPING OF ATRIAL APPENDAGE  04/13/2015   Procedure: CLIPPING OF ATRIAL APPENDAGE;  Surgeon: Judie Grieve  Jennefer Bravo, MD;  Location: [facility] OR;  Service: Open Heart Surgery;;  . CORONARY ARTERY BYPASS GRAFT N/A 04/13/2015   Procedure: CORONARY ARTERY BYPASS GRAFTING (CABG);  Surgeon: Alleen Borne, MD;  Location: [facility] OR;  Service: Open Heart Surgery;  Laterality: N/A;  . TEE WITHOUT CARDIOVERSION N/A 04/13/2015   Procedure: TRANSESOPHAGEAL ECHOCARDIOGRAM (TEE);  Surgeon: Alleen Borne, MD;  Location: [facility] OR;  Service: Open  Heart Surgery;  Laterality: N/A;    Social History   Social History  . Marital status: Widowed    Spouse name: N/A  . Number of children: N/A  . Years of education: N/A   Occupational History  . disabled    Social History Main Topics  . Smoking status: Former Smoker    Quit date: 2000  . Smokeless tobacco: Never Used  . Alcohol use No  . Drug use: No  . Sexual activity: Yes   Other Topics Concern  . Not on file   Social History Narrative  . No narrative on file    Allergies  Allergen Reactions  . Niacin And Related Other (See Comments)    FLUSHING  . Penicillins Itching and Rash    Has patient had a PCN reaction causing immediate rash, facial/tongue/throat swelling, SOB or lightheadedness with hypotension:YES Has patient had a PCN reaction causing severe rash involving mucus membranes or skin necrosis: NO Has patient had a PCN reaction that required hospitalization NO Has patient had a PCN reaction occurring within the last 10 years: NO If all of the above answers are "NO", then may proceed with Cephalosporin use.    Family History  Problem Relation Age of Onset  . Diabetes Mother   . Heart disease Father   . Stroke Brother   . Heart disease Brother   . Diabetes Brother     Prior to Admission medications   Medication Sig Start Date End Date Taking? Authorizing Provider  acetaminophen (TYLENOL) 500 MG tablet Take 1,000 mg by mouth every 6 (six) hours as needed for moderate pain. Reported on 03/11/2016   Yes Historical Provider, MD  albuterol (PROAIR HFA) 108 (90 BASE) MCG/ACT inhaler Inhale 1 puff into the lungs every 6 (six) hours as needed for wheezing or shortness of breath. 09/25/15  Yes [name], MD  allopurinol (ZYLOPRIM) 100 MG tablet Take 1 tablet (100 mg total) by mouth daily. 09/25/15  Yes [name], MD  amiodarone (PACERONE) 200 MG tablet TAKE 1 TABLET(200 MG) BY MOUTH DAILY 11/11/16  Yes [name], MD  aspirin 81 MG EC  tablet Take 1 tablet (81 mg total) by mouth daily. Swallow whole. Patient taking differently: Take 81 mg by mouth daily. Swallow whole. Patient states he is taking this medication every day 09/03/12  Yes [name], MD  benzonatate (TESSALON) 100 MG capsule Take 1 capsule (100 mg total) by mouth every 8 (eight) hours. 10/04/16  Yes Iantha Fallen T Leaphart, PA-C  carvedilol (COREG) 6.25 MG tablet TAKE 1 TABLET(6.25 MG) BY MOUTH TWICE DAILY WITH A MEAL 10/28/16  Yes [name], MD  clotrimazole (LOTRIMIN) 1 % cream Apply 1 application topically 2 (two) times daily. 09/25/15  Yes [name], MD  fluticasone [facility]) 50 MCG/ACT nasal spray Place 1 spray into both nostrils daily.   Yes Historical Provider, MD  furosemide (LASIX) 40 MG tablet Take 1 tablet (40 mg total) by mouth 2 (two) times daily. 09/25/15  Yes [name], MD  lisinopril (PRINIVIL,ZESTRIL) 2.5  MG tablet TAKE 1 TABLET(2.5 MG) BY MOUTH DAILY 10/28/16  Yes [name], MD  ondansetron [facility] ODT) 4 MG disintegrating tablet 4mg  ODT q4 hours prn nausea/vomit 09/21/15  Yes Mirian Mo, MD  potassium chloride SA (K-DUR,KLOR-CON) 20 MEQ tablet Take 1 tablet (20 mEq total) by mouth daily. 09/25/15  Yes [name], MD  pravastatin (PRAVACHOL) 40 MG tablet Take 1 tablet (40 mg total) by mouth daily. 09/25/15  Yes [name], MD  rivaroxaban (XARELTO) 20 MG TABS tablet Take 1 tablet (20 mg total) by mouth daily with supper. 09/25/15  Yes [name], MD  traMADol (ULTRAM) 50 MG tablet Take 1 tablet (50 mg total) by mouth every 8 (eight) hours as needed. Patient not taking: Reported on 12/13/2016 03/11/16   Shelva Majestic, MD  valACYclovir (VALTREX) 1000 MG tablet Take 1 tablet (1,000 mg total) by mouth 3 (three) times daily. Patient not taking: Reported on 12/13/2016 03/11/16   Shelva Majestic, MD    Physical Exam: Vitals:   12/13/16 1930 12/13/16 2030 12/13/16 2059 12/13/16 2100    BP: 141/94 157/98 152/96 (!) 149/84  Pulse: 93 87  98  Resp: (!) 33 23  (!) 22  Temp:    98 F (36.7 C)  TempSrc:    Oral  SpO2: 99% 98%  98%  Weight:    (!) 147.3 kg (324 lb 12.8 oz)  Height:    5\' 10"  (1.778 m)     General:  Appears calm and comfortable and is NAD, eating at the time of my evaluation Eyes:  PERRL, EOMI, normal lids, iris ENT:  grossly normal hearing, lips & tongue, mmm Neck:  no LAD, masses or thyromegaly Cardiovascular:  RRR, no m/r/g. 2+ pitting LE edema.  Respiratory:  CTA bilaterally other than bibasilar crackles. Normal respiratory effort. Abdomen:  soft, ntnd, NABS Skin:  no rash or induration seen on limited exam Musculoskeletal:  grossly normal tone BUE/BLE, good ROM, no bony abnormality Psychiatric:  grossly normal mood and affect, speech fluent and appropriate, AOx3 Neurologic:  CN 2-12 grossly intact, moves all extremities in coordinated fashion, sensation intact  Labs on Admission: I have personally reviewed following labs and imaging studies  CBC:  Recent Labs Lab 12/13/16 1612  WBC 8.1  NEUTROABS 5.9  HGB 13.2  HCT 40.3  MCV 90.8  PLT 210   Basic Metabolic Panel:  Recent Labs Lab 12/13/16 1612  NA 141  K 3.8  CL 108  CO2 24  GLUCOSE 114*  BUN 18  CREATININE 1.28*  CALCIUM 8.9   GFR: Estimated Creatinine Clearance: 82.5 mL/min (by C-G formula based on SCr of 1.28 mg/dL (H)). Liver Function Tests: No results for input(s): AST, ALT, ALKPHOS, BILITOT, PROT, ALBUMIN in the last 168 hours. No results for input(s): LIPASE, AMYLASE in the last 168 hours. No results for input(s): AMMONIA in the last 168 hours. Coagulation Profile: No results for input(s): INR, PROTIME in the last 168 hours. Cardiac Enzymes: No results for input(s): CKTOTAL, CKMB, CKMBINDEX, TROPONINI in the last 168 hours. BNP (last 3 results) No results for input(s): PROBNP in the last 8760 hours. HbA1C: No results for input(s): HGBA1C in the last 72  hours. CBG: No results for input(s): GLUCAP in the last 168 hours. Lipid Profile: No results for input(s): CHOL, HDL, LDLCALC, TRIG, CHOLHDL, LDLDIRECT in the last 72 hours. Thyroid Function Tests: No results for input(s): TSH, T4TOTAL, FREET4, T3FREE, THYROIDAB in the last 72 hours. Anemia Panel:  No results for input(s): VITAMINB12, FOLATE, FERRITIN, TIBC, IRON, RETICCTPCT in the last 72 hours. Urine analysis:    Component Value Date/Time   COLORURINE YELLOW 09/21/2015 1557   APPEARANCEUR CLOUDY (A) 09/21/2015 1557   LABSPEC 1.026 09/21/2015 1557   PHURINE 5.0 09/21/2015 1557   GLUCOSEU NEGATIVE 09/21/2015 1557   HGBUR LARGE (A) 09/21/2015 1557   BILIRUBINUR NEGATIVE 09/21/2015 1557   KETONESUR 15 (A) 09/21/2015 1557   PROTEINUR 30 (A) 09/21/2015 1557   UROBILINOGEN 0.2 09/21/2015 1557   NITRITE NEGATIVE 09/21/2015 1557   LEUKOCYTESUR NEGATIVE 09/21/2015 1557    Creatinine Clearance: Estimated Creatinine Clearance: 82.5 mL/min (by C-G formula based on SCr of 1.28 mg/dL (H)).  Sepsis Labs: @LABRCNTIP (procalcitonin:4,lacticidven:4) )No results found for this or any previous visit (from the past 240 hour(s)).   Radiological Exams on Admission: Dg Chest 2 View  Result Date: 12/13/2016 CLINICAL DATA:  Difficulty breathing with cough and congestion EXAM: CHEST  2 VIEW COMPARISON:  10/21/2016 FINDINGS: Postoperative changes of the mediastinum as before. Non inclusion of extreme left lung base. There are small bilateral pleural effusions, right greater than left. There is moderate cardiomegaly with central vascular congestion. Diffuse interstitial prominence suggests mild edema. Cannot rule out atelectasis or infiltrate at the left base. No pneumothorax. IMPRESSION: Small bilateral right greater than left pleural effusions. Unable to rule out mild atelectasis or infiltrate at the left base Cardiomegaly with central vascular congestion and mild suspected interstitial edema Electronically  Signed   By: Jasmine Pang M.D.   On: 12/13/2016 14:35    EKG: Independently reviewed.  Afib with rate 111; RBBB, bifascicular block, nonspecific ST changes with no evidence of acute ischemia  Assessment/Plan Principal Problem:   CHF exacerbation (HCC) Active Problems:   Hyperlipidemia   Morbid obesity (HCC)   Paroxysmal atrial fibrillation (HCC)   Acute on chronic systolic congestive heart failure (HCC)   Long-term (current) use of anticoagulants   S/P CABG x 4   Hyperglycemia   CHF exacerbation -Patient with known h/o CAD s/p CABG and chronic systolic CHF presenting with worsening SOB and hypoxia -Initial concern for PNA but CXR more consistent with pulmonary edema -Normal WBC count, no infectious symptoms; does not need antibiotics -Elevated BNP to 417.5, 89.6 in 10/17 -Initial EKG suggested recurrent afib with RVR, but RRR while I was in room and appeared to be in NSR; will repeat EKG in AM -Rate controlled at the time of my evaluation, continue Xarelto -With symptoms, elevated BNP, and abnl CXR, CHF exacerbation seems most probable  -Will admit with telemetry -Will request repeat echocardiogram -Will continue ASA -Will continue Lisinopril 2.5 mg daily  -Continue Coreg - but if his symptoms are not improving this may need to be held in the setting of acute exacerbation -CHF order set utilized; may need CHF team consult but will hold until Echo results are available -Was given Lasix 40 mg x 1 in ER and will repeat with 40 mg IV BID -Continue [place] O2 for now -Normal kidney function at this time, will follow (Creatinine 1.28, was 1.18 in 10/17) -Will r/o with serial troponins although doubt ACS based on symptoms  Hyperglycemia -Glucose 114 -Does not have a known h/o DM -Will follow with fasting AM labs for now    DVT prophylaxis: Xarelto  Code Status: Full - confirmed with patient Family Communication: None present Disposition Plan: Home once clinically improved Consults  called: Case management Admission status: Admit - It is my clinical opinion that admission to  INPATIENT is reasonable and necessary because this patient will require at least 2 midnights in the hospital to treat this condition based on the medical complexity of the problems presented.  Given the aforementioned information, the predictability of an adverse outcome is felt to be significant.    Jonah Blue MD [facility]  If 7PM-7AM, please contact night-coverage [email or web address] Password [email or web address]  12/13/2016, 11:09 PM

## 2016-12-13 NOTE — ED Provider Notes (Signed)
[facility] Provider Note   CSN: [id number] Arrival date & time: 12/13/16  1534     History   Chief Complaint Chief Complaint  Patient presents with  . Shortness of Breath    HPI Charles Mckinney is a 66 y.o. male.  The history is provided by the patient.  Shortness of Breath  This is a recurrent problem. The average episode lasts 1 day. The problem occurs continuously.The current episode started 12 to 24 hours ago. The problem has been gradually worsening. Associated symptoms include chest pain and leg swelling (pt states his leg swelling is baseline). Pertinent negatives include no fever, no headaches, no cough, no sputum production, no wheezing, no syncope and no abdominal pain. Associated medical issues include CAD, heart failure and past MI.  -Pt woke up in middle of night with dyspnea and mild substernal chest pain. Pt endorses lightheadedness with this episode. No current CP.  Past Medical History:  Diagnosis Date  . A-fib (HCC)   . Anxiety   . CHF (congestive heart failure) (HCC)   . Dysrhythmia    ATRIAL FIBRILATION  . GERD (gastroesophageal reflux disease)   . Hyperlipidemia   . Hypertension   . Hypertensive heart disease   . Kidney stones   . Obesity (BMI 30-39.9)   . Osteoarthritis   . PONV (postoperative nausea and vomiting)   . Shortness of breath dyspnea     Patient Active Problem List   Diagnosis Date Noted  . 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 Acute on chronic systolic heart failure (HCC) 04/05/2015  . Acute systolic heart failure (HCC) 03/31/2015  . Acute on chronic systolic congestive heart failure (HCC) 01/19/2015  . Chronic systolic heart failure (HCC) 01/19/2015  . 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 Long-term (current) use of anticoagulants 01/19/2015  . Paroxysmal atrial fibrillation (HCC)   . GERD 11/22/2007  . Hyperlipidemia   . Morbid obesity (HCC)   . Hypertensive heart disease     Past Surgical History:    Procedure Laterality Date  . CARDIAC CATHETERIZATION N/A 04/09/2015   Procedure: Right/Left Heart Cath and Coronary Angiography;  Surgeon: Orpah Cobb, MD;  [facility];  Service: Cardiovascular;  Laterality: N/A;  . CARDIOVERSION N/A 01/19/2015   Procedure: CARDIOVERSION;  Surgeon: Othella Boyer, MD;  Location: [facility] ENDOSCOPY;  Service: Cardiovascular;  Laterality: N/A;  pt in Afib, 12:22 synched cardioversion @  120 joules using Propofol 100 mg,IV....unsuccessful, repeated at 200 joules  . CARDIOVERSION N/A 04/02/2015   Procedure: CARDIOVERSION;  Surgeon: Orpah Cobb, MD;  Location: [facility] ENDOSCOPY;  Service: Cardiovascular;  Laterality: N/A;  . CATARACT EXTRACTION    . CLIPPING OF ATRIAL APPENDAGE  04/13/2015   Procedure: CLIPPING OF ATRIAL APPENDAGE;  Surgeon: Alleen Borne, MD;  Location: [facility] OR;  Service: Open Heart Surgery;;  . CORONARY ARTERY BYPASS GRAFT N/A 04/13/2015   Procedure: CORONARY ARTERY BYPASS GRAFTING (CABG);  Surgeon: Alleen Borne, MD;  Location[facility] OR;  Service: Open Heart Surgery;  Laterality: N/A;  . TEE WITHOUT CARDIOVERSION N/A 04/13/2015   Procedure: TRANSESOPHAGEAL ECHOCARDIOGRAM (TEE);  Surgeon: Alleen Borne, MD;  Location: [facility] OR;  Service: Open Heart Surgery;  Laterality: N/A;       Home Medications    Prior to Admission medications   Medication Sig Start Date End Date Taking? Authorizing Provider  acetaminophen (TYLENOL) 500 MG tablet Take 1,000 mg by mouth every 6 (six) hours as needed for moderate pain.  Reported on 03/11/2016    Historical Provider, MD  albuterol (PROAIR HFA) 108 (90 BASE) MCG/ACT inhaler Inhale 1 puff into the lungs every 6 (six) hours as needed for wheezing or shortness of breath. 09/25/15   [name], MD  allopurinol (ZYLOPRIM) 100 MG tablet Take 1 tablet (100 mg total) by mouth daily. 09/25/15   [name], MD  amiodarone (PACERONE) 200 MG tablet TAKE 1 TABLET(200 MG) BY MOUTH DAILY 11/11/16   [name], MD  aspirin 81 MG EC tablet Take 1 tablet (81 mg total) by mouth daily. Swallow whole. Patient taking differently: Take 81 mg by mouth daily as needed for pain. Swallow whole. 09/03/12   [name], MD  benzonatate (TESSALON) 100 MG capsule Take 1 capsule (100 mg total) by mouth every 8 (eight) hours. 10/04/16   Rise Mu, PA-C  carvedilol (COREG) 6.25 MG tablet TAKE 1 TABLET(6.25 MG) BY MOUTH TWICE DAILY WITH A MEAL 10/28/16   [name], MD  clotrimazole (LOTRIMIN) 1 % cream Apply 1 application topically 2 (two) times daily. 09/25/15   [name], MD  fluticasone (FLONASE) 50 MCG/ACT nasal spray Place 1 spray into both nostrils daily.    Historical Provider, MD  furosemide (LASIX) 40 MG tablet Take 1 tablet (40 mg total) by mouth 2 (two) times daily. 09/25/15   [name], MD  GuaiFENesin (MUCINEX PO) Take 30 mLs by mouth daily as needed.    Historical Provider, MD  lisinopril (PRINIVIL,ZESTRIL) 2.5 MG tablet TAKE 1 TABLET(2.5 MG) BY MOUTH DAILY 10/28/16   [name], MD  ondansetron (ZOFRAN ODT) 4 MG disintegrating tablet 4mg  ODT q4 hours prn nausea/vomit 09/21/15   Mirian Mo, MD  potassium chloride SA (K-DUR,KLOR-CON) 20 MEQ tablet Take 1 tablet (20 mEq total) by mouth daily. 09/25/15   [name], MD  pravastatin (PRAVACHOL) 40 MG tablet Take 1 tablet (40 mg total) by mouth daily. 09/25/15   [name], MD  rivaroxaban (XARELTO) 20 MG TABS tablet Take 1 tablet (20 mg total) by mouth daily with supper. 09/25/15   [name], MD  traMADol (ULTRAM) 50 MG tablet Take 1 tablet (50 mg total) by mouth every 8 (eight) hours as needed. 03/11/16   Shelva Majestic, MD  valACYclovir (VALTREX) 1000 MG tablet Take 1 tablet (1,000 mg total) by mouth 3 (three) times daily. 03/11/16   Shelva Majestic, MD    Family History Family History  Problem Relation Age of Onset  . Diabetes Mother   . Heart disease Father     . Stroke Brother   . Heart disease Brother   . Diabetes Brother     Social History Social History  Substance Use Topics  . Smoking status: Former Games developer  . Smokeless tobacco: Never Used  . Alcohol use No     Allergies   Niacin and related and Penicillins   Review of Systems Review of Systems  Constitutional: Negative for chills and fever.  Respiratory: Positive for shortness of breath. Negative for cough, sputum production and wheezing.   Cardiovascular: Positive for chest pain and leg swelling (pt states his leg swelling is baseline). Negative for palpitations and syncope.  Gastrointestinal: Negative for abdominal pain and nausea.  Neurological: Positive for light-headedness (not currently). Negative for syncope and headaches.  All other systems reviewed and are negative.    Physical Exam Updated Vital Signs SpO2 99% Comment: on 2L O2  Physical Exam  Constitutional: He appears well-developed and well-nourished. No distress.  HENT:  Head: Normocephalic and atraumatic.  Mouth/Throat: Oropharynx is clear and moist.  Eyes: Conjunctivae are normal.  Neck: Neck supple.  Cardiovascular: Normal rate.  An irregularly irregular rhythm present.  No murmur heard. Pulmonary/Chest: Effort normal. No respiratory distress. He has no wheezes. He has rales (bibasilar).  Abdominal: Soft. There is no tenderness.  Musculoskeletal: He exhibits edema (2+ pitting edema up b/l lower legs).  Neurological: He is alert.  Skin: Skin is warm and dry.  Psychiatric: He has a normal mood and affect.  Nursing note and vitals reviewed.    ED Treatments / Results  Labs (all labs ordered are listed, but only abnormal results are displayed) Labs Reviewed - No data to display  EKG  EKG Interpretation None       Radiology Dg Chest 2 View  Result Date: 12/13/2016 CLINICAL DATA:  Difficulty breathing with cough and congestion EXAM: CHEST  2 VIEW COMPARISON:  10/21/2016 FINDINGS:  Postoperative changes of the mediastinum as before. Non inclusion of extreme left lung base. There are small bilateral pleural effusions, right greater than left. There is moderate cardiomegaly with central vascular congestion. Diffuse interstitial prominence suggests mild edema. Cannot rule out atelectasis or infiltrate at the left base. No pneumothorax. IMPRESSION: Small bilateral right greater than left pleural effusions. Unable to rule out mild atelectasis or infiltrate at the left base Cardiomegaly with central vascular congestion and mild suspected interstitial edema Electronically Signed   By: Jasmine Pang M.D.   On: 12/13/2016 14:35    Procedures Procedures (including critical care time)  Medications Ordered in ED Medications - No data to display   Initial Impression / Assessment and Plan / ED Course  I have reviewed the triage vital signs and the nursing notes.  Pertinent labs & imaging results that were available during my care of the patient were reviewed by me and considered in my medical decision making (see chart for details).  Clinical Course    Patient is a 66 year old male with history of CAD status post CABG, CHF, A. fib on Xarelto who presents with 1 day of worsening shortness of breath. Patient woke up in the middle of the night with dyspnea and mild substernal chest pain. His chest pain resolved however the shortness of breath continued throughout the day and has been progressively worsening. He also noticed significant increase in dyspnea on exertion today. Patient went to the urgent care where he was found to have pulmonary edema on chest x-ray and was sent to the emergency room for further evaluation. Next  On exam patient has bibasilar rales and 2+ pitting edema in bilateral lower extremities. Patient likely having CHF exacerbation. Also concern for possible ACS due to his pain episode earlier today. Chest pain may also been secondary to fluid overload.  Patient ordered  Lasix 40 mg IV. Basic labs, BNP, troponin ordered as well. Plan disposition for admission to medicine for further management of CHF exacerbation. Patient also on 2 L nasal cannula which is a new O2 requirement.  1st trop neg. BNP >400. CXR shows fluid overload. Admit to tele bed.   Pt seen with attending Dr. Judd Lien.  Final Clinical Impressions(s) / ED Diagnoses   Final diagnoses:  SOB (shortness of breath)  Acute on chronic congestive heart failure, unspecified congestive heart failure type [facility])    New Prescriptions New Prescriptions   No medications on file     Dwana Melena, DO 12/13/16 1835  Geoffery Lyons, MD 12/13/16 [phone]

## 2016-12-13 NOTE — ED Notes (Signed)
Ophelia Charter MD, Jimmy Picket RN, [facility] aware 3E refusing to take pt d/t diastolic >90. Will admin hydralazine and take pt up.

## 2016-12-13 NOTE — ED Notes (Addendum)
[facility] and ED Charge RN, [place] were called and given report by [place], CMA.

## 2016-12-13 NOTE — ED Provider Notes (Signed)
[facility]    CSN: [id number] Arrival date & time: 12/13/16  1353     History   Chief Complaint Chief Complaint  Patient presents with  . Shortness of Breath    HPI Charles Mckinney is a 66 y.o. male.   The history is provided by the patient.  Shortness of Breath  Severity:  Moderate Onset quality:  Sudden Duration:  1 day Progression:  Worsening Chronicity:  Recurrent Relieved by:  Sitting up and rest Worsened by:  Activity Associated symptoms: PND   Associated symptoms: no chest pain and no fever   Risk factors: obesity     Past Medical History:  Diagnosis Date  . A-fib (HCC)   . Anxiety   . CHF (congestive heart failure) (HCC)   . Dysrhythmia    ATRIAL FIBRILATION  . GERD (gastroesophageal reflux disease)   . Hyperlipidemia   . Hypertension   . Hypertensive heart disease   . Kidney stones   . Obesity (BMI 30-39.9)   . Osteoarthritis   . PONV (postoperative nausea and vomiting)   . Shortness of breath dyspnea     Patient Active Problem List   Diagnosis Date Noted  . 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 Acute on chronic systolic heart failure (HCC) 04/05/2015  . Acute systolic heart failure (HCC) 03/31/2015  . Acute on chronic systolic congestive heart failure (HCC) 01/19/2015  . Chronic systolic heart failure (HCC) 01/19/2015  . 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 Long-term (current) use of anticoagulants 01/19/2015  . Paroxysmal atrial fibrillation (HCC)   . GERD 11/22/2007  . Hyperlipidemia   . Morbid obesity (HCC)   . Hypertensive heart disease     Past Surgical History:  Procedure Laterality Date  . CARDIAC CATHETERIZATION N/A 04/09/2015   Procedure: Right/Left Heart Cath and Coronary Angiography;  Surgeon: Orpah Cobb, MD;  [facility];  Service: Cardiovascular;  Laterality: N/A;  . CARDIOVERSION N/A 01/19/2015   Procedure: CARDIOVERSION;  Surgeon: Othella Boyer, MD;  Location: [facility] ENDOSCOPY;   Service: Cardiovascular;  Laterality: N/A;  pt in Afib, 12:22 synched cardioversion @  120 joules using Propofol 100 mg,IV....unsuccessful, repeated at 200 joules  . CARDIOVERSION N/A 04/02/2015   Procedure: CARDIOVERSION;  Surgeon: Orpah Cobb, MD;  Location: [facility] ENDOSCOPY;  Service: Cardiovascular;  Laterality: N/A;  . CATARACT EXTRACTION    . CLIPPING OF ATRIAL APPENDAGE  04/13/2015   Procedure: CLIPPING OF ATRIAL APPENDAGE;  Surgeon: Alleen Borne, MD;  Location: [facility] OR;  Service: Open Heart Surgery;;  . CORONARY ARTERY BYPASS GRAFT N/A 04/13/2015   Procedure: CORONARY ARTERY BYPASS GRAFTING (CABG);  Surgeon: Alleen Borne, MD;  Location: [facility] OR;  Service: Open Heart Surgery;  Laterality: N/A;  . TEE WITHOUT CARDIOVERSION N/A 04/13/2015   Procedure: TRANSESOPHAGEAL ECHOCARDIOGRAM (TEE);  Surgeon: Alleen Borne, MD;  Location: [facility] OR;  Service: Open Heart Surgery;  Laterality: N/A;       Home Medications    Prior to Admission medications   Medication Sig Start Date End Date Taking? Authorizing Provider  acetaminophen (TYLENOL) 500 MG tablet Take 1,000 mg by mouth every 6 (six) hours as needed for moderate pain. Reported on 03/11/2016    Historical Provider, MD  albuterol (PROAIR HFA) 108 (90 BASE) MCG/ACT inhaler Inhale 1 puff into the lungs every 6 (six) hours as needed for wheezing or shortness of breath. 09/25/15   [name], MD  allopurinol (ZYLOPRIM) 100 MG tablet  Take 1 tablet (100 mg total) by mouth daily. 09/25/15   [name], MD  amiodarone (PACERONE) 200 MG tablet TAKE 1 TABLET(200 MG) BY MOUTH DAILY 11/11/16   [name], MD  aspirin 81 MG EC tablet Take 1 tablet (81 mg total) by mouth daily. Swallow whole. Patient taking differently: Take 81 mg by mouth daily as needed for pain. Swallow whole. 09/03/12   [name], MD  benzonatate (TESSALON) 100 MG capsule Take 1 capsule (100 mg total) by mouth every 8 (eight) hours. 10/04/16   Rise Mu,  PA-C  carvedilol (COREG) 6.25 MG tablet TAKE 1 TABLET(6.25 MG) BY MOUTH TWICE DAILY WITH A MEAL 10/28/16   [name], MD  clotrimazole (LOTRIMIN) 1 % cream Apply 1 application topically 2 (two) times daily. 09/25/15   [name], MD  fluticasone (FLONASE) 50 MCG/ACT nasal spray Place 1 spray into both nostrils daily.    Historical Provider, MD  furosemide (LASIX) 40 MG tablet Take 1 tablet (40 mg total) by mouth 2 (two) times daily. 09/25/15   [name], MD  GuaiFENesin (MUCINEX PO) Take 30 mLs by mouth daily as needed.    Historical Provider, MD  lisinopril (PRINIVIL,ZESTRIL) 2.5 MG tablet TAKE 1 TABLET(2.5 MG) BY MOUTH DAILY 10/28/16   [name], MD  ondansetron (ZOFRAN ODT) 4 MG disintegrating tablet 4mg  ODT q4 hours prn nausea/vomit 09/21/15   Mirian Mo, MD  potassium chloride SA (K-DUR,KLOR-CON) 20 MEQ tablet Take 1 tablet (20 mEq total) by mouth daily. 09/25/15   [name], MD  pravastatin (PRAVACHOL) 40 MG tablet Take 1 tablet (40 mg total) by mouth daily. 09/25/15   [name], MD  rivaroxaban (XARELTO) 20 MG TABS tablet Take 1 tablet (20 mg total) by mouth daily with supper. 09/25/15   [name], MD  traMADol (ULTRAM) 50 MG tablet Take 1 tablet (50 mg total) by mouth every 8 (eight) hours as needed. 03/11/16   Shelva Majestic, MD  valACYclovir (VALTREX) 1000 MG tablet Take 1 tablet (1,000 mg total) by mouth 3 (three) times daily. 03/11/16   Shelva Majestic, MD    Family History Family History  Problem Relation Age of Onset  . Diabetes Mother   . Heart disease Father   . Stroke Brother   . Heart disease Brother   . Diabetes Brother     Social History Social History  Substance Use Topics  . Smoking status: Former Games developer  . Smokeless tobacco: Never Used  . Alcohol use No     Allergies   Niacin and related and Penicillins   Review of Systems Review of Systems  Constitutional: Positive for activity  change. Negative for fever.  Respiratory: Positive for shortness of breath.   Cardiovascular: Positive for palpitations and PND. Negative for chest pain and leg swelling.  All other systems reviewed and are negative.    Physical Exam Triage Vital Signs ED Triage Vitals [12/13/16 1403]  Enc Vitals Group     BP 146/94     Pulse Rate 105     Resp (!) 30     Temp 98.2 F (36.8 C)     Temp src      SpO2 95 %     Weight      Height      Head Circumference      Peak Flow      Pain Score      Pain Loc  Pain Edu?      Excl. in GC?    No data found.   Updated Vital Signs BP 146/94 (BP Location: Left Arm)   Pulse 105   Temp 98.2 F (36.8 C)   Resp (!) 30   SpO2 95%   Visual Acuity Right Eye Distance:   Left Eye Distance:   Bilateral Distance:    Right Eye Near:   Left Eye Near:    Bilateral Near:     Physical Exam  Constitutional: He is oriented to person, place, and time. He appears well-developed and well-nourished. He appears distressed.  HENT:  Mouth/Throat: Oropharynx is clear and moist.  Neck: Normal range of motion. Neck supple.  Cardiovascular: Normal pulses.  An irregularly irregular rhythm present. Exam reveals gallop and S3.   Pulmonary/Chest: He is in respiratory distress. He has rales in the right lower field and the left lower field.  Abdominal: Soft. Bowel sounds are normal.  Lymphadenopathy:    He has no cervical adenopathy.  Neurological: He is alert and oriented to person, place, and time.  Skin: Skin is warm and dry.  Nursing note and vitals reviewed.    UC Treatments / Results  Labs (all labs ordered are listed, but only abnormal results are displayed) Labs Reviewed - No data to display  EKG  EKG Interpretation None       Radiology Dg Chest 2 View  Result Date: 12/13/2016 CLINICAL DATA:  Difficulty breathing with cough and congestion EXAM: CHEST  2 VIEW COMPARISON:  10/21/2016 FINDINGS: Postoperative changes of the mediastinum  as before. Non inclusion of extreme left lung base. There are small bilateral pleural effusions, right greater than left. There is moderate cardiomegaly with central vascular congestion. Diffuse interstitial prominence suggests mild edema. Cannot rule out atelectasis or infiltrate at the left base. No pneumothorax. IMPRESSION: Small bilateral right greater than left pleural effusions. Unable to rule out mild atelectasis or infiltrate at the left base Cardiomegaly with central vascular congestion and mild suspected interstitial edema Electronically Signed   By: Jasmine Pang M.D.   On: 12/13/2016 14:35    Procedures Procedures (including critical care time)  Medications Ordered in UC Medications - No data to display   Initial Impression / Assessment and Plan / UC Course  I have reviewed the triage vital signs and the nursing notes.  Pertinent labs & imaging results that were available during my care of the patient were reviewed by me and considered in my medical decision making (see chart for details).  Clinical Course     Sent for treatment of chf.  Final Clinical Impressions(s) / UC Diagnoses   Final diagnoses:  None    New Prescriptions New Prescriptions   No medications on file     Linna Hoff, MD 01/01/17 2131

## 2016-12-13 NOTE — Progress Notes (Signed)
Patient arrived to 3E10 from [facility]. Admit with CHF. A&Ox4. Afib on telemetry. VSS. No complaints of pain. No skin break down noted. CHF plan of care initiated.

## 2016-12-13 NOTE — ED Triage Notes (Signed)
Per [facility], Pt from urgent care. Pt reports having SHOB for a month. Pt was seen at [facility] for this, they ordered for him to sleep on wedge pillow. Pt reports some chest pressure last night. Denies pain at this time. Pt had open heart surgery in 2016, hx of A-fib. Pt put on 2L [place] by [facility], Sats-99% on 2L. Pt has 2+ pedal edema. Pt currently not in any acute distress.

## 2016-12-13 NOTE — ED Triage Notes (Addendum)
Pt     Has  Had    Chest        Virus      X   1  Month   Ago  Hasepisodes  When  He gets      Gets   Short  Of  Breath         On  Exertion   Had   Chest  Pain  Earlier  But is  Better   Now    He   Drove  Himself to  The  Clinic

## 2016-12-14 ENCOUNTER — Inpatient Hospital Stay (HOSPITAL_COMMUNITY): Payer: Medicare Other

## 2016-12-14 ENCOUNTER — Other Ambulatory Visit (HOSPITAL_COMMUNITY): Payer: Medicare Other

## 2016-12-14 DIAGNOSIS — Z7901 Long term (current) use of anticoagulants: Secondary | ICD-10-CM

## 2016-12-14 DIAGNOSIS — I480 Paroxysmal atrial fibrillation: Secondary | ICD-10-CM

## 2016-12-14 DIAGNOSIS — R739 Hyperglycemia, unspecified: Secondary | ICD-10-CM

## 2016-12-14 DIAGNOSIS — I509 Heart failure, unspecified: Secondary | ICD-10-CM

## 2016-12-14 DIAGNOSIS — Z951 Presence of aortocoronary bypass graft: Secondary | ICD-10-CM

## 2016-12-14 DIAGNOSIS — I5023 Acute on chronic systolic (congestive) heart failure: Secondary | ICD-10-CM

## 2016-12-14 DIAGNOSIS — E785 Hyperlipidemia, unspecified: Secondary | ICD-10-CM

## 2016-12-14 LAB — TROPONIN I: Troponin I: 0.03 ng/mL (ref <0.03)

## 2016-12-14 LAB — BASIC METABOLIC PANEL
Anion gap: 10 (ref 5–15)
BUN: 19 mg/dL (ref 6–20)
CHLORIDE: 104 mmol/L (ref 101–111)
CO2: 26 mmol/L (ref 22–32)
CREATININE: 1.19 mg/dL (ref 0.61–1.24)
Calcium: 8.9 mg/dL (ref 8.9–10.3)
GFR calc Af Amer: >60 mL/min (ref >60)
GFR calc non Af Amer: >60 mL/min (ref >60)
Glucose, Bld: 119 mg/dL — ABNORMAL HIGH (ref 65–99)
Potassium: 3.4 mmol/L — ABNORMAL LOW (ref 3.5–5.1)
SODIUM: 140 mmol/L (ref 135–145)

## 2016-12-14 LAB — ECHOCARDIOGRAM COMPLETE
Height: 70 in
WEIGHTICAEL: 5142.4 [oz_av]

## 2016-12-14 MED ORDER — PERFLUTREN LIPID MICROSPHERE
1.0000 mL | INTRAVENOUS | Status: AC | PRN
  Administered 2016-12-14: 3 mL via INTRAVENOUS
  Filled 2016-12-14: qty 10

## 2016-12-14 NOTE — Progress Notes (Signed)
PROGRESS NOTE    Charles Mckinney  IDP:[id number] DOB: 01-17-1951 DOA: 12/13/2016  PCP: Rogelia Boga, MD   Brief Narrative:  Charles Mckinney is a 66 y.o. male with medical history significant of morbid obesity, HTN, HLD, h/o afib, and CHF (EF 30-35% on 4/16) presenting with CHF exacerbation.   Subjective: Feels less short of breath than yesterday.   Assessment & Plan:   Principal Problem:   CHF exacerbation- ischemic cardiomyopathy - EF 30-35 % on 4/16- can repeat ECHO as he has not had one in > 1 yr - cont to diureses- having good urine output - cont BB and ACE I  Active Problems:  Hypokalemia - replace- due to lasix     Morbid obesity  Body mass index is 46.12 kg/m.    Paroxysmal atrial fibrillation - amiodarone, Xarelto, Coreg     S/P CABG x 4 - ASA, Statin    DVT prophylaxis: Xarelto Code Status: Full code Family Communication:  Disposition Plan: home tomorrow Consultants:    Procedures:    Antimicrobials:  Anti-infectives    None       Objective: Vitals:   12/14/16 0445 12/14/16 0743 12/14/16 1014 12/14/16 1133  BP: (!) 127/91 (!) 124/59 125/73 131/75  Pulse: 62 97 71 76  Resp: 20 20  18   Temp: 98.4 F (36.9 C) 99.3 F (37.4 C)  97.5 F (36.4 C)  TempSrc: Oral Oral  Oral  SpO2: 96% 97%  95%  Weight: (!) 145.8 kg (321 lb 6.4 oz)     Height:        Intake/Output Summary (Last 24 hours) at 12/14/16 1339 Last data filed at 12/14/16 1135  Gross per 24 hour  Intake              720 ml  Output             2775 ml  Net            -2055 ml   Filed Weights   12/13/16 2100 12/14/16 0445  Weight: (!) 147.3 kg (324 lb 12.8 oz) (!) 145.8 kg (321 lb 6.4 oz)    Examination: General exam: Appears comfortable  HEENT: PERRLA, oral mucosa moist, no sclera icterus or thrush Respiratory system: mild crackles at bases Respiratory effort normal. Cardiovascular system: S1 & S2 heard, RRR.  No murmurs  Gastrointestinal system: Abdomen soft,  non-tender, nondistended. Normal bowel sound. No organomegaly Central nervous system: Alert and oriented. No focal neurological deficits. Extremities: No cyanosis, clubbing - 2 + pedal edema Skin: No rashes or ulcers Psychiatry:  Mood & affect appropriate.     Data Reviewed: I have personally reviewed following labs and imaging studies  CBC:  Recent Labs Lab 12/13/16 1612  WBC 8.1  NEUTROABS 5.9  HGB 13.2  HCT 40.3  MCV 90.8  PLT 210   Basic Metabolic Panel:  Recent Labs Lab 12/13/16 1612 12/14/16 0454  NA 141 140  K 3.8 3.4*  CL 108 104  CO2 24 26  GLUCOSE 114* 119*  BUN 18 19  CREATININE 1.28* 1.19  CALCIUM 8.9 8.9   GFR: Estimated Creatinine Clearance: 88.2 mL/min (by C-G formula based on SCr of 1.19 mg/dL). Liver Function Tests: No results for input(s): AST, ALT, ALKPHOS, BILITOT, PROT, ALBUMIN in the last 168 hours. No results for input(s): LIPASE, AMYLASE in the last 168 hours. No results for input(s): AMMONIA in the last 168 hours. Coagulation Profile: No results for input(s): INR, PROTIME in the last  168 hours. Cardiac Enzymes:  Recent Labs Lab 12/14/16 0454  TROPONINI 0.03*   BNP (last 3 results) No results for input(s): PROBNP in the last 8760 hours. HbA1C: No results for input(s): HGBA1C in the last 72 hours. CBG: No results for input(s): GLUCAP in the last 168 hours. Lipid Profile: No results for input(s): CHOL, HDL, LDLCALC, TRIG, CHOLHDL, LDLDIRECT in the last 72 hours. Thyroid Function Tests: No results for input(s): TSH, T4TOTAL, FREET4, T3FREE, THYROIDAB in the last 72 hours. Anemia Panel: No results for input(s): VITAMINB12, FOLATE, FERRITIN, TIBC, IRON, RETICCTPCT in the last 72 hours. Urine analysis:    Component Value Date/Time   COLORURINE YELLOW 09/21/2015 1557   APPEARANCEUR CLOUDY (A) 09/21/2015 1557   LABSPEC 1.026 09/21/2015 1557   PHURINE 5.0 09/21/2015 1557   GLUCOSEU NEGATIVE 09/21/2015 1557   HGBUR LARGE (A)  09/21/2015 1557   BILIRUBINUR NEGATIVE 09/21/2015 1557   KETONESUR 15 (A) 09/21/2015 1557   PROTEINUR 30 (A) 09/21/2015 1557   UROBILINOGEN 0.2 09/21/2015 1557   NITRITE NEGATIVE 09/21/2015 1557   LEUKOCYTESUR NEGATIVE 09/21/2015 1557   Sepsis Labs: @LABRCNTIP (procalcitonin:4,lacticidven:4) )No results found for this or any previous visit (from the past 240 hour(s)).       Radiology Studies: Dg Chest 2 View  Result Date: 12/13/2016 CLINICAL DATA:  Difficulty breathing with cough and congestion EXAM: CHEST  2 VIEW COMPARISON:  10/21/2016 FINDINGS: Postoperative changes of the mediastinum as before. Non inclusion of extreme left lung base. There are small bilateral pleural effusions, right greater than left. There is moderate cardiomegaly with central vascular congestion. Diffuse interstitial prominence suggests mild edema. Cannot rule out atelectasis or infiltrate at the left base. No pneumothorax. IMPRESSION: Small bilateral right greater than left pleural effusions. Unable to rule out mild atelectasis or infiltrate at the left base Cardiomegaly with central vascular congestion and mild suspected interstitial edema Electronically Signed   By: Jasmine Pang M.D.   On: 12/13/2016 14:35      Scheduled Meds: . allopurinol  100 mg Oral Daily  . amiodarone  200 mg Oral Daily  . aspirin EC  81 mg Oral Daily  . carvedilol  6.25 mg Oral BID WC  . fluticasone  1 spray Each Nare Daily  . furosemide  40 mg Intravenous Q12H  . lisinopril  2.5 mg Oral Daily  . potassium chloride SA  20 mEq Oral Daily  . pravastatin  40 mg Oral Daily  . rivaroxaban  20 mg Oral Q supper  . sodium chloride flush  3 mL Intravenous Q12H   Continuous Infusions:   LOS: 1 day    Time spent in minutes: 35    Bryson Palen, MD [facility] Pager: [email or web address] Password [email or web address] 12/14/2016, 1:39 PM

## 2016-12-14 NOTE — Progress Notes (Signed)
Patient up to chair eating lunch. Patient was set up for bath and required not assist. No issues or concerns for patient today. No acute distress noted.

## 2016-12-14 NOTE — Care Management Note (Addendum)
Case Management Note  Patient Details  Name: Charles Mckinney MRN: [id number] Date of Birth: 08/30/1951  Subjective/Objective:        Admitted with CHF            Action/Plan: Patient lives alone, continues to drive his car; PCP: Rogelia Boga, MD; has private insurance with [facility] with prescription drug coverage; pharmacy of choice is [facility]; patient reports no problem getting his medication; He also [facility] to the [facility] for [facility]; he also stated that he eats a low sodium diet; CM following for DCP  12/15/2016- Daphanie with the [facility] will bring patient scales at discharge.  Expected Discharge Date:   possibly 12/18/2016               Expected Discharge Plan:  Home/Self Care  Discharge planning Services  CM Consult   Status of Service:  In process, will continue to follow  Reola Mosher [phone] 12/14/2016, 2:33 PM

## 2016-12-14 NOTE — Progress Notes (Signed)
PT Cancellation Note  Patient Details Name: Charles Mckinney MRN: [id number] DOB: 1951-03-06   Cancelled Treatment:    Reason Eval/Treat Not Completed: Patient at procedure or test/unavailable. Initially unable to see pt due to elevated Troponin. Checking back once noted to be trending down but pt out of room at procedure. PT to follow for evaluation as able.    Christiane Ha, PT, CSCS Pager [phone] Office [phone]  12/14/2016, 4:21 PM

## 2016-12-14 NOTE — Progress Notes (Signed)
  Echocardiogram 2D Echocardiogram has been performed with definity.  Charles Mckinney 12/14/2016, 4:21 PM

## 2016-12-15 DIAGNOSIS — I110 Hypertensive heart disease with heart failure: Principal | ICD-10-CM

## 2016-12-15 LAB — BASIC METABOLIC PANEL
Anion gap: 10 (ref 5–15)
BUN: 21 mg/dL — AB (ref 6–20)
CALCIUM: 9 mg/dL (ref 8.9–10.3)
CO2: 27 mmol/L (ref 22–32)
CREATININE: 1.21 mg/dL (ref 0.61–1.24)
Chloride: 103 mmol/L (ref 101–111)
GFR calc Af Amer: >60 mL/min (ref >60)
GLUCOSE: 102 mg/dL — AB (ref 65–99)
Potassium: 3.4 mmol/L — ABNORMAL LOW (ref 3.5–5.1)
Sodium: 140 mmol/L (ref 135–145)

## 2016-12-15 LAB — MAGNESIUM: Magnesium: 2.1 mg/dL (ref 1.7–2.4)

## 2016-12-15 MED ORDER — POTASSIUM CHLORIDE CRYS ER 20 MEQ PO TBCR: 40.0000 meq | EXTENDED_RELEASE_TABLET | Freq: Every day | ORAL | 3 refills | Status: DC

## 2016-12-15 MED ORDER — POTASSIUM CHLORIDE CRYS ER 20 MEQ PO TBCR
40.0000 meq | EXTENDED_RELEASE_TABLET | ORAL | Status: DC
  Administered 2016-12-15: 40 meq via ORAL
  Filled 2016-12-15: qty 2

## 2016-12-15 MED ORDER — FUROSEMIDE 40 MG PO TABS: 80.0000 mg | ORAL_TABLET | Freq: Every day | ORAL | 3 refills | Status: DC

## 2016-12-15 MED ORDER — LOSARTAN POTASSIUM 25 MG PO TABS: 25.0000 mg | ORAL_TABLET | Freq: Every day | ORAL | 0 refills | Status: DC

## 2016-12-15 NOTE — Progress Notes (Signed)
[name] was inpatient at [facility] and discharged today 12/15/16. He may return to work tomorrow 12/16/16 per Dr. Butler Denmark.  Hewitt Blade, MSN, BSN, Nurse, learning disability of Nursing [phone]

## 2016-12-15 NOTE — Evaluation (Signed)
Physical Therapy Evaluation Patient Details Name: Charles Mckinney MRN: [id number] DOB: [birth year] Today's Date: 12/15/2016   History of Present Illness  Pt is a 66 y/o male admitted secondary to fatigue and SOB. PMH including but not limited to CHF, HTN, obesity and hx of CABG in 2016.  Clinical Impression  Pt presented sitting OOB in chair when PT entered room. Prior to admission, pt reported that he is independent with all functional mobility and ADLs. Pt currently at mod I or supervision level for all mobility. No further acute PT needs identified at this time. PT signing off.    Follow Up Recommendations No PT follow up    Equipment Recommendations  None recommended by PT    Recommendations for Other Services       Precautions / Restrictions Precautions Precautions: None Restrictions Weight Bearing Restrictions: No      Mobility  Bed Mobility               General bed mobility comments: pt sitting OOB in chair when PT entered room  Transfers Overall transfer level: Modified independent Equipment used: None                Ambulation/Gait Ambulation/Gait assistance: Supervision Ambulation Distance (Feet): 300 Feet Assistive device: None Gait Pattern/deviations: Step-through pattern;Wide base of support Gait velocity: decreased Gait velocity interpretation: Below normal speed for age/gender General Gait Details: no LOB or instability. pt ambulated on RA with SPO2 maintaining >90%  Stairs            Wheelchair Mobility    Modified Rankin (Stroke Patients Only)       Balance Overall balance assessment: No apparent balance deficits (not formally assessed)                                           Pertinent Vitals/Pain Pain Assessment: No/denies pain    Home Living Family/patient expects to be discharged to:: Private residence Living Arrangements: Alone Available Help at Discharge: Family;Available  PRN/intermittently Type of Home: House Home Access: Stairs to enter Entrance Stairs-Rails: Doctor, general practice of Steps: 4 Home Layout: One level Home Equipment: Cane - single point;Walker - 2 wheels      Prior Function Level of Independence: Independent               Hand Dominance        Extremity/Trunk Assessment   Upper Extremity Assessment Upper Extremity Assessment: Overall WFL for tasks assessed    Lower Extremity Assessment Lower Extremity Assessment: Overall WFL for tasks assessed    Cervical / Trunk Assessment Cervical / Trunk Assessment: Normal  Communication   Communication: No difficulties  Cognition Arousal/Alertness: Awake/alert Behavior During Therapy: WFL for tasks assessed/performed Overall Cognitive Status: Within Functional Limits for tasks assessed                      General Comments      Exercises     Assessment/Plan    PT Assessment Patent does not need any further PT services  PT Problem List            PT Treatment Interventions      PT Goals (Current goals can be found in the Care Plan section)  Acute Rehab PT Goals Patient Stated Goal: return home    Frequency     Barriers to discharge  Co-evaluation               End of Session   Activity Tolerance: Patient tolerated treatment well Patient left: in chair;with call bell/phone within reach Nurse Communication: Mobility status         Time: 0847-0900 PT Time Calculation (min) (ACUTE ONLY): 13 min   Charges:   PT Evaluation $PT Eval Low Complexity: 1 Procedure     PT G CodesAlessandra Bevels Jed Kutch 12/15/2016, 3:40 PM Deborah Chalk, PT, DPT [phone]

## 2016-12-15 NOTE — Consult Note (Signed)
   [facility] CM Inpatient Consult   12/15/2016  [name] [birth year] [phone]   Consult received in progression meeting for [name] for post hospital follow up for education and HF management.  Chart review reveals the patient, Charles Mckinney is a 66 y.o. male with medical history significant of morbid obesity, HTN, HLD, h/o afib, and CHF (EF 30-35% on 4/16) presenting with CHF exacerbation.  He reports h/o CAD s/p CABG 2016.  Recently, he noticed getting tired/SOB quickly. Presented with SOB.  Was whistling with breathing.  Went to urgent care and had an EKG and they sent an ambulance to bring him here.  Some chest pressure this AM upon awakening, substernal, relieved with his meds.  Continues to feel SOB, requiring O2.  He also had UC/ER visits for similar symptoms but without admission on 10/04/16 and 10/11/16.  Met with the patient at the bedside.  Patient indicated he does not have a scale, states, "I can't afford it, I had to go back to work just to be able to afford heat for my house, I was trying to work but got too short of breath and swollen."  Consent form signed and given the [facility] inofrmation folder with contact information as well.  Inpatient RNCM had assign the patient for HF EMMI calls but patient indicated to this writer that he works and would rather make appointments with his follow up with nursing.  He states he does not have difficulty getting his medications but has to monitor his finances closely. He uses Environmental health practitioner for his prescription medications.  He endorses Dr. Bluford Kaufmann, as his primary care provider and he sees him once or twice a year.   He has a cell phone with minutes that number is [phone] but has to manage his minutes.  He does have an answering machine on his home phone.  Patient will be followed for post hospital calls and assessed for monthly home visits.  [facility] Care Management does not interfere with or replace any services needed or  arranged by the inpatient care management staff.  For questions, please contact:  Natividad Brood, RN BSN [facility] Liaison  [phone] business mobile phone Toll free office [phone] '

## 2016-12-15 NOTE — Discharge Instructions (Signed)
Your Lisinopril has been changed to Losartan. Please make note of this.   Please take all your medications with you for your next visit with your Primary MD. Please request your Primary MD to go over all hospital test results at the follow up. Please ask your Primary MD to get all Hospital records sent to his/her office.  If you experience worsening of your admission symptoms, develop shortness of breath, chest pain, suicidal or homicidal thoughts or a life threatening emergency, you must seek medical attention immediately by calling 911 or calling your MD.  Bonita Quin must read the complete instructions/literature along with all the possible adverse reactions/side effects for all the medicines you take including new medications that have been prescribed to you. Take new medicines after you have completely understood and accpet all the possible adverse reactions/side effects.   Do not drive when taking pain medications or sedatives.    Do not take more than prescribed Pain, Sleep and Anxiety Medications  If you have smoked or chewed Tobacco in the last 2 yrs please stop. Stop any regular alcohol and or recreational drug use.  Wear Seat belts while driving.

## 2016-12-15 NOTE — Discharge Summary (Signed)
Physician Discharge Summary  Charles Mckinney ZOX:[id number] DOB: [birth year] DOA: 12/13/2016  PCP: Rogelia Boga, MD  Admit date: 12/13/2016 Discharge date: 12/15/2016  Admitted From: home Disposition:  home   Recommendations for Outpatient Follow-up:  1. F/u [facility] in wk and fluid status    Home Health:  no  Equipment/Devices:  no    Discharge Condition:  stable   CODE STATUS:  Full code   Diet recommendation:  Low sodium, heart healthy Consultations:  none    Discharge Diagnoses:  Principal Problem:   Acute on chronic systolic congestive heart failure (HCC) Active Problems:   Hyperlipidemia   Morbid obesity (HCC)   Hypertensive heart disease   Paroxysmal atrial fibrillation (HCC)   Long-term (current) use of anticoagulants   S/P CABG x 4    Subjective: Charles Severs Ingoldis a 65 y.o.malewith medical history significant of morbid obesity, HTN, HLD, h/o afib, and CHF (EF 30-35% on 4/16) presenting with CHF exacerbation and main complaint of dyspnea on exertion and orthopnea.    Brief Summary: Principal Problem:   CHF exacerbation- ischemic cardiomyopathy - EF 30-35 % on 4/16-  repeat ECHO is unchanged from prior  - cont BB and ACE I- Dr [name] would like to switch Lisinopril to Losartan and will further switch him to [facility] as outpt - he has diuresed well and no longer dyspneic- pulse ox on room air 97% - increase Lasix from 40 BID to 80 in AM and 40 in PM- potassium increased accordingly - discussed fluid restriction, dietary changes and daily weights- he dose not have a scale - will see if social work can arrange it for him - d/w Dr [name], his primary cardiologist who will f/u with the patient in 1 wk in his office  Active Problems:  Hypokalemia - replacing- due to lasix     Morbid obesity  Body mass index is 46.12 kg/m.    Paroxysmal atrial fibrillation - amiodarone, Xarelto, Coreg     S/P CABG x 4 - ASA, Statin    Discharge  Instructions  Discharge Instructions    (HEART FAILURE PATIENTS) Call MD:  Anytime you have any of the following symptoms: 1) 3 pound weight gain in 24 hours or 5 pounds in 1 week 2) shortness of breath, with or without a dry hacking cough 3) swelling in the hands, feet or stomach 4) if you have to sleep on extra pillows at night in order to breathe.    Complete by:  As directed    Diet - low sodium heart healthy    Complete by:  As directed    Increase activity slowly    Complete by:  As directed      Allergies as of 12/15/2016      Reactions   Niacin And Related Other (See Comments)   FLUSHING   Penicillins Itching, Rash   Has patient had a PCN reaction causing immediate rash, facial/tongue/throat swelling, SOB or lightheadedness with hypotension:YES Has patient had a PCN reaction causing severe rash involving mucus membranes or skin necrosis: NO Has patient had a PCN reaction that required hospitalization NO Has patient had a PCN reaction occurring within the last 10 years: NO If all of the above answers are "NO", then may proceed with Cephalosporin use.      Medication List    TAKE these medications   acetaminophen 500 MG tablet Commonly known as:  TYLENOL Take 1,000 mg by mouth every 6 (six) hours as needed for moderate pain. Reported  on 03/11/2016   albuterol 108 (90 Base) MCG/ACT inhaler Commonly known as:  PROAIR HFA Inhale 1 puff into the lungs every 6 (six) hours as needed for wheezing or shortness of breath.   allopurinol 100 MG tablet Commonly known as:  ZYLOPRIM Take 1 tablet (100 mg total) by mouth daily.   amiodarone 200 MG tablet Commonly known as:  PACERONE TAKE 1 TABLET(200 MG) BY MOUTH DAILY   aspirin 81 MG EC tablet Take 1 tablet (81 mg total) by mouth daily. Swallow whole. What changed:  additional instructions   benzonatate 100 MG capsule Commonly known as:  TESSALON Take 1 capsule (100 mg total) by mouth every 8 (eight) hours.   carvedilol 6.25 MG  tablet Commonly known as:  COREG TAKE 1 TABLET(6.25 MG) BY MOUTH TWICE DAILY WITH A MEAL   clotrimazole 1 % cream Commonly known as:  LOTRIMIN Apply 1 application topically 2 (two) times daily.   fluticasone 50 MCG/ACT nasal spray Commonly known as:  FLONASE Place 1 spray into both nostrils daily.   furosemide 40 MG tablet Commonly known as:  LASIX Take 2 tablets (80 mg total) by mouth daily. Take 80 mg in AM and 40 in late afternoon What changed:  how much to take  when to take this  additional instructions   lisinopril 2.5 MG tablet Commonly known as:  PRINIVIL,ZESTRIL TAKE 1 TABLET(2.5 MG) BY MOUTH DAILY   ondansetron 4 MG disintegrating tablet Commonly known as:  ZOFRAN ODT 4mg  ODT q4 hours prn nausea/vomit   potassium chloride SA 20 MEQ tablet Commonly known as:  K-DUR,KLOR-CON Take 2 tablets (40 mEq total) by mouth daily. What changed:  how much to take   pravastatin 40 MG tablet Commonly known as:  PRAVACHOL Take 1 tablet (40 mg total) by mouth daily.   rivaroxaban 20 MG Tabs tablet Commonly known as:  XARELTO Take 1 tablet (20 mg total) by mouth daily with supper.      Follow-up Information    [facility], MD. Schedule an appointment as soon as possible for a visit in 1 week(s).   Specialty:  Cardiology Contact information: [address] [place] [address] [phone]        Rogelia Boga, MD. Schedule an appointment as soon as possible for a visit in 1 month(s).   Specialty:  Internal Medicine Contact information: [address] [place] [address] [phone]          Allergies  Allergen Reactions  . Niacin And Related Other (See Comments)    FLUSHING  . Penicillins Itching and Rash    Has patient had a PCN reaction causing immediate rash, facial/tongue/throat swelling, SOB or lightheadedness with hypotension:YES Has patient had a PCN reaction causing severe rash involving mucus membranes or skin  necrosis: NO Has patient had a PCN reaction that required hospitalization NO Has patient had a PCN reaction occurring within the last 10 years: NO If all of the above answers are "NO", then may proceed with Cephalosporin use.     Procedures/Studies: 2 d ECHO Study Conclusions  - Left ventricle: The cavity size was normal. Systolic function was   moderately to severely reduced. The estimated ejection fraction   was in the range of 30% to 35%. Diffuse hypokinesis. The study is   not technically sufficient to allow evaluation of LV diastolic   function. - Mitral valve: There was mild regurgitation directed   eccentrically. - Right atrium: The atrium was mildly dilated.   Dg  Chest 2 View  Result Date: 12/13/2016 CLINICAL DATA:  Difficulty breathing with cough and congestion EXAM: CHEST  2 VIEW COMPARISON:  10/21/2016 FINDINGS: Postoperative changes of the mediastinum as before. Non inclusion of extreme left lung base. There are small bilateral pleural effusions, right greater than left. There is moderate cardiomegaly with central vascular congestion. Diffuse interstitial prominence suggests mild edema. Cannot rule out atelectasis or infiltrate at the left base. No pneumothorax. IMPRESSION: Small bilateral right greater than left pleural effusions. Unable to rule out mild atelectasis or infiltrate at the left base Cardiomegaly with central vascular congestion and mild suspected interstitial edema Electronically Signed   By: Jasmine Pang M.D.   On: 12/13/2016 14:35       Discharge Exam: Vitals:   12/14/16 2001 12/15/16 0350  BP: 114/79 107/83  Pulse: 83 79  Resp: 18 20  Temp: 97.5 F (36.4 C) 97.7 F (36.5 C)   Vitals:   12/14/16 1014 12/14/16 1133 12/14/16 2001 12/15/16 0350  BP: 125/73 131/75 114/79 107/83  Pulse: 71 76 83 79  Resp:  18 18 20   Temp:  97.5 F (36.4 C) 97.5 F (36.4 C) 97.7 F (36.5 C)  TempSrc:  Oral Oral Oral  SpO2:  95% 96% 97%  Weight:    (!) 145.6 kg  (320 lb 14.4 oz)  Height:        General: Pt is alert, awake, not in acute distress Cardiovascular: RRR, S1/S2 +, no rubs, no gallops Respiratory: CTA bilaterally, no wheezing, no rhonchi Abdominal: Soft, NT, ND, bowel sounds + Extremities: no edema, no cyanosis    The results of significant diagnostics from this hospitalization (including imaging, microbiology, ancillary and laboratory) are listed below for reference.     Microbiology: No results found for this or any previous visit (from the past 240 hour(s)).   Labs: BNP (last 3 results)  Recent Labs  10/04/16 2130 12/13/16 1612  BNP 89.6 417.5*   Basic Metabolic Panel:  Recent Labs Lab 12/13/16 1612 12/14/16 0454 12/15/16 0400  NA 141 140 140  K 3.8 3.4* 3.4*  CL 108 104 103  CO2 24 26 27   GLUCOSE 114* 119* 102*  BUN 18 19 21*  CREATININE 1.28* 1.19 1.21  CALCIUM 8.9 8.9 9.0  MG  --   --  2.1   Liver Function Tests: No results for input(s): AST, ALT, ALKPHOS, BILITOT, PROT, ALBUMIN in the last 168 hours. No results for input(s): LIPASE, AMYLASE in the last 168 hours. No results for input(s): AMMONIA in the last 168 hours. CBC:  Recent Labs Lab 12/13/16 1612  WBC 8.1  NEUTROABS 5.9  HGB 13.2  HCT 40.3  MCV 90.8  PLT 210   Cardiac Enzymes:  Recent Labs Lab 12/14/16 0454 12/14/16 1326  TROPONINI 0.03* <0.03   BNP: Invalid input(s): POCBNP CBG: No results for input(s): GLUCAP in the last 168 hours. D-Dimer No results for input(s): DDIMER in the last 72 hours. Hgb A1c No results for input(s): HGBA1C in the last 72 hours. Lipid Profile No results for input(s): CHOL, HDL, LDLCALC, TRIG, CHOLHDL, LDLDIRECT in the last 72 hours. Thyroid function studies No results for input(s): TSH, T4TOTAL, T3FREE, THYROIDAB in the last 72 hours.  Invalid input(s): FREET3 Anemia work up No results for input(s): VITAMINB12, FOLATE, FERRITIN, TIBC, IRON, RETICCTPCT in the last 72 hours. Urinalysis     Component Value Date/Time   COLORURINE YELLOW 09/21/2015 1557   APPEARANCEUR CLOUDY (A) 09/21/2015 1557   LABSPEC 1.026 09/21/2015  1557   PHURINE 5.0 09/21/2015 1557   GLUCOSEU NEGATIVE 09/21/2015 1557   HGBUR LARGE (A) 09/21/2015 1557   BILIRUBINUR NEGATIVE 09/21/2015 1557   KETONESUR 15 (A) 09/21/2015 1557   PROTEINUR 30 (A) 09/21/2015 1557   UROBILINOGEN 0.2 09/21/2015 1557   NITRITE NEGATIVE 09/21/2015 1557   LEUKOCYTESUR NEGATIVE 09/21/2015 1557   Sepsis Labs Invalid input(s): PROCALCITONIN,  WBC,  LACTICIDVEN Microbiology No results found for this or any previous visit (from the past 240 hour(s)).   Time coordinating discharge: Over 30 minutes  SIGNED:   Calvert Cantor, MD  [facility] 12/15/2016, 8:54 AM Pager   If 7PM-7AM, please contact night-coverage [email or web address] Password [email or web address]

## 2016-12-15 NOTE — Progress Notes (Signed)
At 1117 pt d/c to home after all d/c instructions explained and given to pt.  Verbalized understanding.  D/c to awaiting transport.

## 2016-12-15 NOTE — Progress Notes (Signed)
OT Cancellation Note  Patient Details Name: Charles Mckinney MRN: [id number] DOB: 30-Dec-1950   Cancelled Treatment:     Reason Eval and/or treatment not complete. Orders received and chart reviewed for Occupational Therapy. Pt was ambulating in room upon OT arrival. He was dressed and packed in room, ready for discharge. Currently waiting on d/c papers. Denies acute OT needs at this time. Will sign off as pt has d/c orders in chart.  Charletta Cousin Ah Bott Beth Dixon, OTR/L 12/15/2016, 10:54 AM

## 2016-12-15 NOTE — Progress Notes (Signed)
Discharge instructions have been given to patient including follow up appointment, medications, and when to notify physician. Patient verbalizes understanding of discharge instructions.

## 2016-12-16 ENCOUNTER — Encounter: Payer: Self-pay | Admitting: *Deleted

## 2016-12-16 ENCOUNTER — Telehealth: Payer: Self-pay

## 2016-12-16 ENCOUNTER — Other Ambulatory Visit: Payer: Self-pay | Admitting: *Deleted

## 2016-12-16 NOTE — Telephone Encounter (Signed)
D/C 12/15/16 To: home    LMTCB

## 2016-12-16 NOTE — Patient Outreach (Addendum)
[facility] Customer service manager [facility]) Care Management [facility] Community CM Telephone Outreach, Transition of Care, Day 1 12/16/2016  Charles Mckinney 01/03/1951 [id number]  Successful telephone outreach to Charles Mckinney, 66 y/o male referrd to [facility] Community CM after multiple recent ED and hospital visits.  [name] most recent hospitalization was January 9-11, 2018 for shortness of breath/ acute on chronic CHF exacerbation.  Patient has history including, but not limited to, CHF, CAD with CABG x 4 in 2016, paroxysmal Atrial fibrillation, HTN, HLD, and morbid obesity.  HIPAA/ identity verified with patient during call today.  Written consent for [facility] CM services obtained December 15, 2016 by [facility] RN CM hospital liaison, and verbal consent confirmed with patient during phone call today.    Today, Charles Mckinney states that he is doing "better" after recent hospitalization, and denies questions, concerns, issues or problems this morning.  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  Patient reports that he obtained his new medications post-hospital discharge yesterday afternoon, and he verbalizes accurate understanding of instructions for his medications.  Patient reports that he self-administers/ manages medications.  Patient confirmed that he has scheduled follow up appointments with both his PCP and his cardiologist, and voices plans to attend, stating he will drive himself.  Denies community resource needs this morning, and states that he has family that lives nearby that are able to assist with his health care needs on a limited ability.  During phone call today, we discussed [facility] Community CM services, and scheduled appointment next week for [facility] Community CM initial home visit.  I provided Charles Mckinney with my direct phone number, the main [facility] CM office phone number,  and the [facility] CM 24-hour nurse advice phone number should issues arise prior to scheduled initial home visit next week.  40 minute phone conversation today.  Plan:  Charles Mckinney will take his medications as they are prescribed and will attend all provider appointments.   Charles Mckinney will contact his providers for any concerns, needs, issues, or problems that arise.  I will make patient's PCP aware of [facility] Community CM involvement in patient's care.   [facility] Community CM outreach for transition of care to continue with scheduled initial home visit next week.  [name], RN, BSN, [employer] [facility] Care Management  [phone]

## 2016-12-20 ENCOUNTER — Encounter: Payer: Self-pay | Admitting: *Deleted

## 2016-12-20 ENCOUNTER — Other Ambulatory Visit: Payer: Self-pay | Admitting: *Deleted

## 2016-12-20 NOTE — Patient Outreach (Signed)
[facility]) Care Management  [facility] CM Initial Home Visit, Transition of Care day 5 12/20/2016  GUERIN LASHOMB Apr 29, 1951 [id number]  SKILER OLDEN is a 66 y.o. male referred to [facility] Community CM after multiple recent ED and hospital visits.  [name] most recent hospitalization was January 9-11, 2018 for shortness of breath/ acute on chronic CHF exacerbation.  Patient has history including, but not limited to, CHF, CAD with CABG x 4 in 2016, paroxysmal Atrial fibrillation, HTN, HLD, and morbid obesity.  HIPAA/ identity verified with patient in person at his home today.  Confirmed that written consent for [facility] CM services obtained December 15, 2016 by [facility] RN CM hospital liaison, visualized patient copy of consent in [facility] folder.  Today, Mr. Tomeo states that he continues doing "better" after recent hospitalization, and denies questions, concerns, issues or problems this morning.  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  -- reports that he has all prescribed medications post-hospital discharge and verbalizes accurate understanding of general purpose, dosing, and scheduling of medications.  Self-administers/ manages medications.  Patient was recently discharged from hospital and all medications were thoroughly reviewed with him/ medication reconciliation completed during [facility] CM initial home visit today.  -- confirmed that he has scheduled follow up provider appointments with both his PCP and his cardiologist, and voices plans to attend, stating he will drive himself.  All scheduled hospital follow up appointments reviewed with patient today.  -- previously denied community resource needs stating that he has family that lives nearby that are able to assist with his health care needs on a limited ability.  However, today, patient reports that he could use assistance with food acquisition needs;  reports that he is already established with several churches who assist him with food acquisition.  Today, I provided patient with additional resource options for food assistance, and patient agrees to review these resources and contact agencies for more information.  -- Advanced Directive (AD) planning:  Patient states that he does not have Advanced Directives in place.  Advanced directive planning education provided/ thoroughly discussed with patient during [facility] CM initial home visit today, and AD packet was provided to patient and explained.  -- falls/ safety concerns:  Patient denies recent falls, and no obvious fall risks were identified in his home today.  General education on fall prevention/ safety discussed with patient today.  -- self- health management of chronic disease state of CHF:  Patient denies previous education on weight gain guidelines/ CHF zones, educational materials provided to patient today and thoroughly discussed with patient.  Patient does not have accurate scales in his home and therefore does not monitor/ record daily weights.  Discussed with patient that I would attempt facilitation of patient obtaining new scales so that daily weight monitoring can be initiated.  Patient stated that he does not wish to have scales delivered to his home, as every time he receives a delivery to his front porch, "it gets stolen."  I contacted [facility] CMA to have scales delivered to [facility] CM main office and will communicate with patient to provide scales to him directly once they arrive.  Patient denies further issues, concerns, or problems today.  I confirmed that Mr. Tarver has my direct phone number, the main [facility] CM office phone number, and the [facility] CM 24-hour nurse advice phone number should issues arise prior to next scheduled [facility] Community CM outreach.  Subjective: "  I've got to stay healthy-- my dog needs me to take care of him; I don't know what would happen to him if I wasn't  around."  Objective:   BP 128/68   Pulse 78   Resp 16   SpO2 95%     Review of Systems  Constitutional: Negative.  Negative for fever and malaise/fatigue.  Respiratory: Negative for cough, shortness of breath and wheezing.   Cardiovascular: Positive for orthopnea. Negative for chest pain.  Gastrointestinal: Negative for abdominal pain and nausea.  Genitourinary: Positive for frequency and urgency.       Diuretic therapy  Musculoskeletal: Negative for falls and joint pain.       Occasional (L) knee pain  Neurological: Negative for dizziness and weakness.  Psychiatric/Behavioral: Negative for depression. The patient is not nervous/anxious.     Physical Exam  Constitutional: He is oriented to person, place, and time. He appears well-developed and well-nourished. No distress.  Cardiovascular: Normal rate, regular rhythm and intact distal pulses.   Distant heart sounds; patient is obese  Respiratory: Effort normal and breath sounds normal. No respiratory distress. He has no wheezes. He has no rales.  GI: Soft. Bowel sounds are normal.  Musculoskeletal:  Patient did not wish to remove his shoes this morning for assessment of LE edema; states that his legs "look they always do, they are a little bit swollen but nothing bad where I can't get my shoes on."  No obvious swelling noted on ankles around shoes  Neurological: He is alert and oriented to person, place, and time.  Skin: Skin is warm and dry.  Psychiatric: He has a normal mood and affect. His behavior is normal. Judgment and thought content normal.    Encounter Medications:   Outpatient Encounter Prescriptions as of 12/20/2016  Medication Sig Note  . acetaminophen (TYLENOL) 500 MG tablet Take 1,000 mg by mouth every 6 (six) hours as needed for moderate pain. Reported on 03/11/2016   . albuterol (PROAIR HFA) 108 (90 BASE) MCG/ACT inhaler Inhale 1 puff into the lungs every 6 (six) hours as needed for wheezing or shortness of breath.    . allopurinol (ZYLOPRIM) 100 MG tablet Take 1 tablet (100 mg total) by mouth daily.   Marland Kitchen amiodarone (PACERONE) 200 MG tablet TAKE 1 TABLET(200 MG) BY MOUTH DAILY   . aspirin 81 MG EC tablet Take 1 tablet (81 mg total) by mouth daily. Swallow whole. (Patient taking differently: Take 81 mg by mouth daily. Swallow whole. Patient states he is taking this medication every day) 12/13/2016: -  . benzonatate (TESSALON) 100 MG capsule Take 1 capsule (100 mg total) by mouth every 8 (eight) hours.   . carvedilol (COREG) 6.25 MG tablet TAKE 1 TABLET(6.25 MG) BY MOUTH TWICE DAILY WITH A MEAL   . clotrimazole (LOTRIMIN) 1 % cream Apply 1 application topically 2 (two) times daily.   . fluticasone (FLONASE) 50 MCG/ACT nasal spray Place 1 spray into both nostrils daily.   . furosemide (LASIX) 40 MG tablet Take 2 tablets (80 mg total) by mouth daily. Take 80 mg in AM and 40 in late afternoon   . losartan (COZAAR) 25 MG tablet Take 1 tablet (25 mg total) by mouth daily.   . ondansetron (ZOFRAN ODT) 4 MG disintegrating tablet 4mg  ODT q4 hours prn nausea/vomit   . potassium chloride SA (K-DUR,KLOR-CON) 20 MEQ tablet Take 2 tablets (40 mEq total) by mouth daily.   . pravastatin (PRAVACHOL) 40 MG tablet Take 1 tablet (40 mg  total) by mouth daily.   . rivaroxaban (XARELTO) 20 MG TABS tablet Take 1 tablet (20 mg total) by mouth daily with supper.    No facility-administered encounter medications on file as of 12/20/2016.     Functional Status:   In your present state of health, do you have any difficulty performing the following activities: 12/16/2016 12/13/2016  Hearing? N N  Vision? N N  Difficulty concentrating or making decisions? N N  Walking or climbing stairs? Y N  Dressing or bathing? N N  Doing errands, shopping? N N  Preparing Food and eating ? N -  Using the Toilet? N -  In the past six months, have you accidently leaked urine? N -  Do you have problems with loss of bowel control? N -  Managing your  Medications? N -  Managing your Finances? N -  Housekeeping or managing your Housekeeping? N -  Some recent data might be hidden    Fall/Depression Screening:    PHQ 2/9 Scores 12/16/2016 09/25/2015  PHQ - 2 Score 1 0    Assessment:  Mr. Muzquiz is recuperating well after his recent hospitalization and could benefit from ongoing education around self-health management of chronic disease state of CHF.  Mr. Markarian does not have accurate scales at his home which prevents him from monitoring/ recording daily weights.  Mr. Whyte is interested in knowing about additional community resources for food assistance.  Mr. walling lives alone and has limited support from his family.  Mr. Lefkowitz continues to work and is able to drive himself to provider appointments.  Mr. Lovingood is committed to compliance around his medication regimen and attending scheduled provider appointments.  Plan:   Mr. Wyndham will take his medications as they are prescribed and will attend all provider appointments.  Mr. Schalk will contact his providers for any concerns, needs, issues, or problems that arise.  I will facilitate Mr. Alexopoulos being provided a new scale so that he can begin monitoring and recording his daily weights.  Mr. Goodson will review educational materials provided to him today re: self-health management of CHF and Advanced Directive planning.  Mr. Blanford will review and contact additional community resource agencies available to him regarding his stated need for food acquisition assistance/ needs.  I will send patient's PCP and cardiologist providers note from today's [facility] Community CM initial home visit.  [facility] CM outreach for transition of care to continue with scheduled telephone call next week.  I appreciate the opportunity to participate in Mr. Markuson's care,  [name], RN, BSN, [employer] Coordinator [facility] Care Management  [phone]

## 2016-12-22 ENCOUNTER — Encounter: Payer: Self-pay | Admitting: *Deleted

## 2016-12-22 ENCOUNTER — Other Ambulatory Visit: Payer: Self-pay | Admitting: *Deleted

## 2016-12-22 NOTE — Patient Outreach (Signed)
Patient triggered Red on EMMI heart failure dashboard, notification sent to Ricke Hey, RN

## 2016-12-22 NOTE — Patient Outreach (Signed)
[facility]) Care Management [facility] CM Telephone Outreach, Transition of Care day 7 RED EMMI alert follow up call 12/22/2016  STEELE ZIMMERLY February 05, 1951 [id number]  Successful telephone outreach to [name], 66 y.o. male referred to [facility] Community CM after multiple recent ED and hospital visits. [name] most recent hospitalization was January 9-11, 2018 for shortness of breath/ acute on chronic CHF exacerbation. Patient has history including, but not limited to, CHF, CAD with CABG x 4 in 2016, paroxysmal Atrial fibrillation, HTN, HLD, and morbid obesity. Patient was discharged home from hospital visit to self-care/ without home health services.  HIPAA/ identity verified with patient during phone call today.   Red EMMI alert received from [facility] CMA, stating that patient has not been weighing himself; as documented during [facility] Community RN CM initial home visit, 12/20/16, patient does not have scales present in his home to complete daily weights.  [facility] RN CM actively working to obtain scales for patient's use at home, and this was reiterated to patient today.  Patient reports that he "is still the same" since our initial home visit this week, and he denies changes in his condition; patient states that he was trying to "get some rest" this afternoon, as he is scheduled to work night shift tonight.  I explained to patient that sometimes the automated EMMI phone calls discover concerns around patient responses, at which time it will trigger [facility] RN CM follow up request.  Patient denies further issues, concerns, or problems today.  I confirmed that patient has my direct phone number, the main [facility] CM office phone number, and the [facility] CM 24-hour nurse advice phone number should issues arise prior to next scheduled [facility] Community CM outreach.  Plan:  Mr. Schwieterman take his medications as they are prescribed and will attend all provider appointments.  Mr. Russaw contact  his providers for any concerns, needs, issues, or problems that arise.  I will facilitate Mr. Noria being provided a new scale so that he can begin monitoring and recording his daily weights.  Mr. Alavi will review educational materials provided to him today re: self-health management of CHF and Advanced Directive planning.  Mr. Villaruz will review and contact additional community resource agencies available to him regarding his stated need for food acquisition assistance/ needs.  [facility] Community CM outreach for transition of care to continue with scheduled telephone call next week.  [name], RN, BSN, [employer] [facility] Care Management  [phone]

## 2016-12-26 DIAGNOSIS — I5022 Chronic systolic (congestive) heart failure: Secondary | ICD-10-CM | POA: Diagnosis not present

## 2016-12-26 DIAGNOSIS — I482 Chronic atrial fibrillation: Secondary | ICD-10-CM | POA: Diagnosis not present

## 2016-12-26 DIAGNOSIS — R072 Precordial pain: Secondary | ICD-10-CM | POA: Diagnosis not present

## 2016-12-26 NOTE — Telephone Encounter (Signed)
LMTCB

## 2016-12-27 ENCOUNTER — Encounter: Payer: Self-pay | Admitting: Internal Medicine

## 2016-12-27 ENCOUNTER — Other Ambulatory Visit: Payer: Self-pay | Admitting: Internal Medicine

## 2016-12-27 ENCOUNTER — Ambulatory Visit (INDEPENDENT_AMBULATORY_CARE_PROVIDER_SITE_OTHER): Payer: Medicare Other | Admitting: Internal Medicine

## 2016-12-27 VITALS — BP 156/82 | HR 101 | Temp 97.4°F | Ht 70.0 in | Wt 327.6 lb

## 2016-12-27 DIAGNOSIS — I110 Hypertensive heart disease with heart failure: Secondary | ICD-10-CM | POA: Diagnosis not present

## 2016-12-27 DIAGNOSIS — I5023 Acute on chronic systolic (congestive) heart failure: Secondary | ICD-10-CM

## 2016-12-27 DIAGNOSIS — I480 Paroxysmal atrial fibrillation: Secondary | ICD-10-CM | POA: Diagnosis not present

## 2016-12-27 DIAGNOSIS — I5022 Chronic systolic (congestive) heart failure: Secondary | ICD-10-CM

## 2016-12-27 DIAGNOSIS — E785 Hyperlipidemia, unspecified: Secondary | ICD-10-CM

## 2016-12-27 DIAGNOSIS — Z951 Presence of aortocoronary bypass graft: Secondary | ICD-10-CM

## 2016-12-27 NOTE — Patient Instructions (Addendum)
Cardiology follow-up next month as scheduled  Limit your sodium (Salt) intake  Weigh yourself daily and report any weight gain in excess of 5 pounds  You need to lose weight.  Consider a lower calorie diet and regular exercise.  Return in 3 months for follow-up or sooner if any concerns such as worsening shortness or breath, weight gain or swelling   Low-Sodium Eating Plan Sodium raises blood pressure and causes water to be held in the body. Getting less sodium from food will help lower your blood pressure, reduce any swelling, and protect your heart, liver, and kidneys. We get sodium by adding salt (sodium chloride) to food. Most of our sodium comes from canned, boxed, and frozen foods. Restaurant foods, fast foods, and pizza are also very high in sodium. Even if you take medicine to lower your blood pressure or to reduce fluid in your body, getting less sodium from your food is important. What is my plan? Most people should limit their sodium intake to 2,300 mg a day. Your health care provider recommends that you limit your sodium intake to __________ a day. What do I need to know about this eating plan? For the low-sodium eating plan, you will follow these general guidelines:  Choose foods with a % Daily Value for sodium of less than 5% (as listed on the food label).  Use salt-free seasonings or herbs instead of table salt or sea salt.  Check with your health care provider or pharmacist before using salt substitutes.  Eat fresh foods.  Eat more vegetables and fruits.  Limit canned vegetables. If you do use them, rinse them well to decrease the sodium.  Limit cheese to 1 oz (28 g) per day.  Eat lower-sodium products, often labeled as "lower sodium" or "no salt added."  Avoid foods that contain monosodium glutamate (MSG). MSG is sometimes added to [place] food and some canned foods.  Check food labels (Nutrition Facts labels) on foods to learn how much sodium is in one  serving.  Eat more home-cooked food and less restaurant, buffet, and fast food.  When eating at a restaurant, ask that your food be prepared with less salt, or no salt if possible. How do I read food labels for sodium information? The Nutrition Facts label lists the amount of sodium in one serving of the food. If you eat more than one serving, you must multiply the listed amount of sodium by the number of servings. Food labels may also identify foods as:  Sodium free-Less than 5 mg in a serving.  Very low sodium-35 mg or less in a serving.  Low sodium-140 mg or less in a serving.  Light in sodium-50% less sodium in a serving. For example, if a food that usually has 300 mg of sodium is changed to become light in sodium, it will have 150 mg of sodium.  Reduced sodium-25% less sodium in a serving. For example, if a food that usually has 400 mg of sodium is changed to reduced sodium, it will have 300 mg of sodium. What foods can I eat? Grains  Low-sodium cereals, including oats, puffed wheat and rice, and shredded wheat cereals. Low-sodium crackers. Unsalted rice and pasta. Lower-sodium bread. Vegetables  Frozen or fresh vegetables. Low-sodium or reduced-sodium canned vegetables. Low-sodium or reduced-sodium tomato sauce and paste. Low-sodium or reduced-sodium tomato and vegetable juices. Fruits  Fresh, frozen, and canned fruit. Fruit juice. Meat and Other Protein Products  Low-sodium canned tuna and salmon. Fresh or frozen meat, poultry,  seafood, and fish. Lamb. Unsalted nuts. Dried beans, peas, and lentils without added salt. Unsalted canned beans. Homemade soups without salt. Eggs. Dairy  Milk. Soy milk. Ricotta cheese. Low-sodium or reduced-sodium cheeses. Yogurt. Condiments  Fresh and dried herbs and spices. Salt-free seasonings. Onion and garlic powders. Low-sodium varieties of mustard and ketchup. Fresh or refrigerated horseradish. Lemon juice. Fats and Oils  Reduced-sodium salad  dressings. Unsalted butter. Other  Unsalted popcorn and pretzels. The items listed above may not be a complete list of recommended foods or beverages. Contact your dietitian for more options.  What foods are not recommended? Grains  Instant hot cereals. Bread stuffing, pancake, and biscuit mixes. Croutons. Seasoned rice or pasta mixes. Noodle soup cups. Boxed or frozen macaroni and cheese. Self-rising flour. Regular salted crackers. Vegetables  Regular canned vegetables. Regular canned tomato sauce and paste. Regular tomato and vegetable juices. Frozen vegetables in sauces. Salted [place] fries. Olives. Rosita Fire. Relishes. Sauerkraut. Salsa. Meat and Other Protein Products  Salted, canned, smoked, spiced, or pickled meats, seafood, or fish. Bacon, ham, sausage, hot dogs, corned beef, chipped beef, and packaged luncheon meats. Salt pork. Jerky. Pickled herring. Anchovies, regular canned tuna, and sardines. Salted nuts. Dairy  Processed cheese and cheese spreads. Cheese curds. Blue cheese and cottage cheese. Buttermilk. Condiments  Onion and garlic salt, seasoned salt, table salt, and sea salt. Canned and packaged gravies. Worcestershire sauce. Tartar sauce. Barbecue sauce. Teriyaki sauce. Soy sauce, including reduced sodium. Steak sauce. Fish sauce. Oyster sauce. Cocktail sauce. Horseradish that you find on the shelf. Regular ketchup and mustard. Meat flavorings and tenderizers. Bouillon cubes. Hot sauce. Tabasco sauce. Marinades. Taco seasonings. Relishes. Fats and Oils  Regular salad dressings. Salted butter. Margarine. Ghee. Bacon fat. Other  Potato and tortilla chips. Corn chips and puffs. Salted popcorn and pretzels. Canned or dried soups. Pizza. Frozen entrees and pot pies. The items listed above may not be a complete list of foods and beverages to avoid. Contact your dietitian for more information.  This information is not intended to replace advice given to you by your health care provider.  Make sure you discuss any questions you have with your health care provider. Document Released: 05/13/2002 Document Revised: 04/28/2016 Document Reviewed: 09/25/2013 Elsevier Interactive Patient Education  2017 [employer].

## 2016-12-27 NOTE — Progress Notes (Signed)
Subjective:    Patient ID: Charles Mckinney, male    DOB: 1951-08-18, 66 y.o.   MRN: [id number]  HPI Admit date: 12/13/2016 Discharge date: 12/15/2016   Discharge Diagnoses:  Principal Problem:   Acute on chronic systolic congestive heart failure (HCC) Active Problems:   Hyperlipidemia   Morbid obesity (HCC)   Hypertensive heart disease   Paroxysmal atrial fibrillation (HCC)   Long-term (current) use of anticoagulants   S/P CABG x 4  Wt Readings from Last 3 Encounters:  12/27/16 (!) 327 lb 9.6 oz (148.6 kg)  12/15/16 (!) 320 lb 14.4 oz (145.6 kg)  10/13/16 (!) 320 lb 8 oz (36.69 kg)   66 year old patient who is seen today following a recent hospital discharge 12 days ago.  He was admitted for decompensated heart failure.  Repeat 2-D echocardiogram revealed a ejection fraction of 30-35%.  He was seen by cardiology in follow-up yesterday in his medications were not adjusted Since his hospital discharge, he has been trying to avoid fast foods and modify his salt intake.  There has been some weight gain since his hospital discharge.  He continues to do well.  He has chronic stable orthopnea, but no further PND or DOE. He is trying to obtain a home scale to weigh daily from [facility].    Hospital records reviewed  Past Medical History:  Diagnosis Date  . Anxiety   . CHF (congestive heart failure) (HCC)   . Dysrhythmia    ATRIAL FIBRILATION, resolved with cardioversion  . GERD (gastroesophageal reflux disease)   . Hyperlipidemia   . Hypertension   . Hypertensive heart disease   . Kidney stones   . Obesity (BMI 30-39.9)   . Osteoarthritis   . PONV (postoperative nausea and vomiting)   . Shortness of breath dyspnea      Social History   Social History  . Marital status: Widowed    Spouse name: N/A  . Number of children: N/A  . Years of education: N/A   Occupational History  . disabled    Social History Main Topics  . Smoking status: Former Smoker    Quit  date: 2000  . Smokeless tobacco: Never Used  . Alcohol use No  . Drug use: No  . Sexual activity: Yes   Other Topics Concern  . Not on file   Social History Narrative  . No narrative on file    Past Surgical History:  Procedure Laterality Date  . CARDIAC CATHETERIZATION N/A 04/09/2015   Procedure: Right/Left Heart Cath and Coronary Angiography;  Surgeon: Orpah Cobb, MD;  Location: [facility] INVASIVE CV LAB [facility]UP[facility];  Service: Cardiovascular;  Laterality: N/A;  . CARDIOVERSION N/A 01/19/2015   Procedure: CARDIOVERSION;  Surgeon: Othella Boyer, MD;  Location: [facility] ENDOSCOPY;  Service: Cardiovascular;  Laterality: N/A;  pt in Afib, 12:22 synched cardioversion @  120 joules using Propofol 100 mg,IV....unsuccessful, repeated at 200 joules  . CARDIOVERSION N/A 04/02/2015   Procedure: CARDIOVERSION;  Surgeon: Orpah Cobb, MD;  Location: [facility] ENDOSCOPY;  Service: Cardiovascular;  Laterality: N/A;  . CATARACT EXTRACTION    . CLIPPING OF ATRIAL APPENDAGE  04/13/2015   Procedure: CLIPPING OF ATRIAL APPENDAGE;  Surgeon: Alleen Borne, MD;  Location: [facility] OR;  Service: Open Heart Surgery;;  . CORONARY ARTERY BYPASS GRAFT N/A 04/13/2015   Procedure: CORONARY ARTERY BYPASS GRAFTING (CABG);  Surgeon: Alleen Borne, MD;  Location: [facility] OR;  Service: Open Heart Surgery;  Laterality: N/A;  . TEE WITHOUT CARDIOVERSION N/A  04/13/2015   Procedure: TRANSESOPHAGEAL ECHOCARDIOGRAM (TEE);  Surgeon: Alleen Borne, MD;  Location: [facility] OR;  Service: Open Heart Surgery;  Laterality: N/A;    Family History  Problem Relation Age of Onset  . Diabetes Mother   . Heart disease Father   . Stroke Brother   . Heart disease Brother   . Diabetes Brother     Allergies  Allergen Reactions  . Niacin And Related Other (See Comments)    FLUSHING  . Penicillins Itching and Rash    Has patient had a PCN reaction causing immediate rash, facial/tongue/throat swelling, SOB or lightheadedness with hypotension:YES Has patient had a PCN  reaction causing severe rash involving mucus membranes or skin necrosis: NO Has patient had a PCN reaction that required hospitalization NO Has patient had a PCN reaction occurring within the last 10 years: NO If all of the above answers are "NO", then may proceed with Cephalosporin use.    Current Outpatient Prescriptions on File Prior to Visit  Medication Sig Dispense Refill  . acetaminophen (TYLENOL) 500 MG tablet Take 1,000 mg by mouth every 6 (six) hours as needed for moderate pain. Reported on 03/11/2016    . albuterol (PROAIR HFA) 108 (90 BASE) MCG/ACT inhaler Inhale 1 puff into the lungs every 6 (six) hours as needed for wheezing or shortness of breath. 1 Inhaler 5  . allopurinol (ZYLOPRIM) 100 MG tablet Take 1 tablet (100 mg total) by mouth daily. 90 tablet 3  . amiodarone (PACERONE) 200 MG tablet TAKE 1 TABLET(200 MG) BY MOUTH DAILY 90 tablet 1  . aspirin 81 MG EC tablet Take 1 tablet (81 mg total) by mouth daily. Swallow whole. (Patient taking differently: Take 81 mg by mouth daily. Swallow whole. Patient states he is taking this medication every day) 30 tablet 12  . benzonatate (TESSALON) 100 MG capsule Take 1 capsule (100 mg total) by mouth every 8 (eight) hours. 12 capsule 0  . carvedilol (COREG) 6.25 MG tablet TAKE 1 TABLET(6.25 MG) BY MOUTH TWICE DAILY WITH A MEAL 90 tablet 0  . clotrimazole (LOTRIMIN) 1 % cream Apply 1 application topically 2 (two) times daily. 30 g 1  . fluticasone (FLONASE) 50 MCG/ACT nasal spray Place 1 spray into both nostrils daily.    . furosemide (LASIX) 40 MG tablet Take 2 tablets (80 mg total) by mouth daily. Take 80 mg in AM and 40 in late afternoon 180 tablet 3  . losartan (COZAAR) 25 MG tablet Take 1 tablet (25 mg total) by mouth daily. 30 tablet 0  . ondansetron (ZOFRAN ODT) 4 MG disintegrating tablet 4mg  ODT q4 hours prn nausea/vomit 15 tablet 0  . potassium chloride SA (K-DUR,KLOR-CON) 20 MEQ tablet Take 2 tablets (40 mEq total) by mouth daily. 90  tablet 3  . pravastatin (PRAVACHOL) 40 MG tablet Take 1 tablet (40 mg total) by mouth daily. 90 tablet 3  . rivaroxaban (XARELTO) 20 MG TABS tablet Take 1 tablet (20 mg total) by mouth daily with supper. 30 tablet 12   No current facility-administered medications on file prior to visit.     BP (!) 156/82 (BP Location: Left Arm, Patient Position: Sitting, Cuff Size: Large)   Pulse (!) 101   Temp 97.4 F (36.3 C) (Oral)   Ht 5\' 10"  (1.778 m)   Wt (!) 327 lb 9.6 oz (148.6 kg)   SpO2 97%   BMI 47.01 kg/m     Review of Systems  Respiratory: Positive for shortness of breath.  Cardiovascular: Positive for leg swelling.       Objective:   Physical Exam  Constitutional: He is oriented to person, place, and time. He appears well-developed.  Weight 327 Repeat blood pressure 110/70  HENT:  Head: Normocephalic.  Right Ear: External ear normal.  Left Ear: External ear normal.  Eyes: Conjunctivae and EOM are normal.  Neck: Normal range of motion.  Cardiovascular: Normal rate and normal heart sounds.   Pulmonary/Chest: He has rales.  Abdominal: Bowel sounds are normal.  Musculoskeletal: Normal range of motion. He exhibits edema. He exhibits no tenderness.  Plus 2 pedal edema  Neurological: He is alert and oriented to person, place, and time.  Psychiatric: He has a normal mood and affect. His behavior is normal.          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  Will continue efforts at salt restricted diet.  Information concerning salt restricted diet, dispensed.  He will continue his efforts to obtain a home scale to monitor daily weights.  He'll report any significant weight gain Obesity Coronary artery disease Paroxysmal atrial fibrillation.  Continue chronic anticoagulation  The patient is scheduled to see cardiology in one month Follow-up here 3 months or as needed  Rogelia Boga

## 2016-12-27 NOTE — Telephone Encounter (Signed)
LMTCB

## 2016-12-27 NOTE — Progress Notes (Signed)
Pre visit review using our clinic review tool, if applicable. No additional management support is needed unless otherwise documented below in the visit note. 

## 2016-12-28 ENCOUNTER — Encounter: Payer: Self-pay | Admitting: *Deleted

## 2016-12-28 ENCOUNTER — Other Ambulatory Visit: Payer: Self-pay | Admitting: *Deleted
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
## 2016-12-29 ENCOUNTER — Other Ambulatory Visit: Payer: Self-pay | Admitting: *Deleted
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
## 2016-12-30 NOTE — Telephone Encounter (Signed)
ATC x3 with no reply. Closing message per protocol.

## 2017-01-03 ENCOUNTER — Other Ambulatory Visit: Payer: Self-pay | Admitting: *Deleted

## 2017-01-03 ENCOUNTER — Encounter: Payer: Self-pay | Admitting: *Deleted

## 2017-01-03 NOTE — Patient Outreach (Signed)
[facility] Customer service manager [facility]) Care Management [facility] Community CM Telephone Outreach, Transition of Care day 19  01/03/2017  Charles Mckinney [birth year] [id number]  Unsuccessful telephone outreach to [name] y.o.malereferred to [facility] Community CM after multiple recent ED and hospital visits. [name] most recent hospitalization was January 9-11, 2018 for shortness of breath/ acute on chronic CHF exacerbation. Patient has history including, but not limited to, CHF, CAD with CABG x 4 in 2016, paroxysmal Atrial fibrillation, HTN, HLD, and morbid obesity. Patient was discharged home from hospital visit to self-care without home health services.   HIPAA compliant voice mail message left for patient, requesting return call back.  Plan:  Will re-attempt [facility] Community CM telephone outreach later this week if I do not hear back from patient first.  [name], RN, BSN, CCRN [facility] Care Management  [phone]

## 2017-01-04 ENCOUNTER — Other Ambulatory Visit: Payer: Self-pay | Admitting: *Deleted

## 2017-01-04 ENCOUNTER — Encounter: Payer: Self-pay | Admitting: *Deleted

## 2017-01-04 ENCOUNTER — Other Ambulatory Visit: Payer: Self-pay | Admitting: Internal Medicine

## 2017-01-04 NOTE — Patient Outreach (Signed)
[facility] service manager [facility] Care Management [facility] Community CM Telephone Outreach, Transition of Care day 20 01/04/2017  KELCY STERCHI [birth year] [id number]  Successful incoming telephone outreach from Minus Brigner Packman,66 y.o.malereferred to [facility] Community CM after multiple recent ED and hospital visits. [name] most recent hospitalization was January 9-11, 2018 for shortness of breath/ acute on chronic CHF exacerbation. Patient has history including, but not limited to, CHF, CAD with CABG x 4 in 2016, paroxysmal Atrial fibrillation, HTN, HLD, and morbid obesity. Patient was discharged home from hospital visit to self-care without home health services. Patient returned my call from yesterday; HIPAA/ identity verified with patient during phone calltoday.   Today, Charles Mckinney reports that things are "going well."    -- has been monitoring and recording his daily weights, using scale provided to him by [facility] CM.  Weight range over last week has been 317-319 lbs, with weight this morning of 318 lbs.  -- has been walking as much as possible around weather, also reports that he went to [employer] and rode stationary bicycle for "about 15 minutes," stating that he "did fine," and "was just a little tired afterward."  -- self-health management of chronic disease state of CHF:  Patient is able to discuss signs/ symptoms fluid overload/ CHF exacerbation; denies concerning symptoms over last week.  Patient denies further issues, concerns, or problems today.  I confirmed that patient has my direct phone number, the main [facility] CM office phone number, and the [facility] CM 24-hour nurse advice phone number should issues arise prior to next scheduled [facility] Community CM outreach.   Plan:  Mr. Dekker take his medications as they are prescribed and will attend all provider appointments.  Mr. Schellhase contact his providers for any concerns, needs, issues, or problems that arise.  Mr. Getachew will  continue monitoring and recording his daily weights.  Mr. Ozbirn will continue reviewingeducational materials provided to him previouslyre: self-health management of CHF and Advanced Directive planning.  Mr. Ajello will review and contact additional community resource agencies available to him regarding his stated need for food acquisition assistance/ needs.  [facility] Community CM outreach for transition of care to continue with scheduled telephone callnext week.   [name], RN, BSN, [employer] [facility] Care Management  [phone]

## 2017-01-05 ENCOUNTER — Ambulatory Visit: Payer: Self-pay | Admitting: *Deleted

## 2017-01-10 ENCOUNTER — Encounter: Payer: Self-pay | Admitting: *Deleted

## 2017-01-10 ENCOUNTER — Other Ambulatory Visit: Payer: Self-pay | Admitting: *Deleted

## 2017-01-10 NOTE — Patient Outreach (Signed)
[facility] Customer service manager [facility]) Care Management [facility] Community CM Telephone Outreach, Transition of Care day 26  01/10/2017  JACORIE MAGNON 03-22-1951 [id number]  Unsuccessful telephone outreach to [name] y.o.malereferred to [facility] Community CM after multiple recent ED and hospital visits. [name] most recent hospitalization was January 9-11, 2018 for shortness of breath/ acute on chronic CHF exacerbation. Patient has history including, but not limited to, CHF, CAD with CABG x 4 in 2016, paroxysmal Atrial fibrillation, HTN, HLD, and morbid obesity. Patient was discharged home from hospital visit to self-care without home health services.   Patient had returned my call from earlier today, and call attempt now was in follow up of general voice mail message patient left for me.  HIPAA compliant voice mail message left for patient, requesting return call back.  Plan:  Will re-attempt [facility] Community CM telephone outreach later this week if I do not hear back from patient first.  [name], RN, BSN, CCRN [facility] Care Management  [phone]

## 2017-01-10 NOTE — Patient Outreach (Signed)
[facility] Customer service manager [facility]) Care Management [facility] Community CM Telephone Outreach, Transition of Care day 26  01/10/2017  Charles Mckinney [birth year] [id number]   Unsuccessful telephone outreach to [name] y.o.malereferred to [facility] Community CM after multiple recent ED and hospital visits. [name] most recent hospitalization was January 9-11, 2018 for shortness of breath/ acute on chronic CHF exacerbation. Patient has history including, but not limited to, CHF, CAD with CABG x 4 in 2016, paroxysmal Atrial fibrillation, HTN, HLD, and morbid obesity. Patient was discharged home from hospital visit to self-care without home health services.   HIPAA compliant voice mail message left for patient, requesting return call back.  Plan:  Will re-attempt [facility] Community CM telephone outreach later this week if I do not hear back from patient first.  [name], RN, BSN, CCRN [facility] Care Management  [phone]

## 2017-01-12 ENCOUNTER — Other Ambulatory Visit: Payer: Self-pay | Admitting: *Deleted

## 2017-01-12 ENCOUNTER — Encounter: Payer: Self-pay | Admitting: *Deleted

## 2017-01-12 NOTE — Patient Outreach (Signed)
[facility] Customer service manager [facility]) Care Management [facility] Community CM Telephone Outreach, Transition of Care day 28 01/12/2017  Charles Mckinney 07-17-1951 [id number]  Unsuccessful telephone outreach to [name] y.o.malereferred to [facility] Community CM after multiple recent ED and hospital visits. [name] most recent hospitalization was January 9-11, 2018 for shortness of breath/ acute on chronic CHF exacerbation. Patient has history including, but not limited to, CHF, CAD with CABG x 4 in 2016, paroxysmal Atrial fibrillation, HTN, HLD, and morbid obesity. Patient was discharged home from hospital visit to self-care without home health services.   HIPAA compliant voice mail message left for patient, requesting return call back.  Plan:  Will re-attempt [facility] Community CM telephone outreach next week if I do not hear back from patient first.  [name], RN, BSN, CCRN [facility] Care Management  [phone]

## 2017-01-13 ENCOUNTER — Other Ambulatory Visit: Payer: Self-pay | Admitting: *Deleted

## 2017-01-13 ENCOUNTER — Encounter: Payer: Self-pay | Admitting: *Deleted

## 2017-01-13 NOTE — Patient Outreach (Signed)
[facility] Customer service manager [facility]) Care Management [facility] Community CM Telephone Outreach, Transition of Care day 29  01/13/2017  Charles Mckinney 02/04/1951 [id number]  Successful incoming telephone outreach from Graden Sandmeyer Turney,66 y.o.malereferred to [facility] Community CM after multiple recent ED and hospital visits. [name] most recent hospitalization was January 9-11, 2018 for shortness of breath/ acute on chronic CHF exacerbation. Patient has history including, but not limited to, CHF, CAD with CABG x 4 in 2016, paroxysmal Atrial fibrillation, HTN, HLD, and morbid obesity. Patient was discharged home from hospital visit to self-care without home health services. Patient returned my call from yesterday; HIPAA/ identity verified with patient during phone calltoday.   Today, Mr. Mayabb reports that things are "going pretty good."    -- has been monitoring and recording his daily weights, using scale provided to him by [facility] CM.  Weight range over last week has been 316-323 lbs, with weight this morning of 316 lbs.  Patient reports his weight increased this week due to constipation; states he took dulcolax and next day, after successful results, weight decreased back to 316 lbs.  Patient denied having symptoms of shortness of breath/ increased fatigue/ peripheral swelling with weight gain this week.   -- has continued walking as much as possible around weather conditions, also reports that he has [facility] as his work schedule allows.  Patient reports that he has tolerated activity without problems.  -- self-health management of chronic disease state of CHF:  Patient is able to discuss signs/ symptoms fluid overload/ CHF exacerbation; denies concerning symptoms over last week.  Patient denies further issues, concerns, or problems today. I confirmed that patient hasmy direct phone number, the main [facility] CM office phone number, and the  [facility] CM 24-hour nurse advice phone number should issues arise prior to next scheduled [facility] Community CM outreach.   Plan:  Mr. Kater take his medications as they are prescribed and will attend all provider appointments.  Mr. Harnage contact his providers for any concerns, needs, issues, or problems that arise.  Mr. Tiongco willcontinue monitoring and recording his daily weights.  Mr. Schreckengost will continue reviewingeducational materials provided to him previouslyre: self-health management of CHF and Advanced Directive planning.  Mr. Oslund will review and contact additional community resource agencies available to him regarding his stated need for food acquisition assistance/ needs.  [facility] Community CM outreach for transition of care to continue with scheduled telephone callnext week.  [name], RN, BSN, [employer] [facility] Care Management  [phone]

## 2017-01-16 ENCOUNTER — Encounter: Payer: Self-pay | Admitting: *Deleted

## 2017-01-16 ENCOUNTER — Other Ambulatory Visit: Payer: Self-pay | Admitting: *Deleted

## 2017-01-16 NOTE — Patient Outreach (Signed)
[facility] Customer service manager [facility]) Care Management [facility] Community CM Telephone Outreach, Transition of Care day 32  01/16/2017  LINDSAY GIBEAULT 1951/05/13 [id number]  Successful telephone outreach toMichael E Detore,66 y.o.malereferred to [facility] Community CM after multiple recent ED and hospital visits. [name] most recent hospitalization was January 9-11, 2018 for shortness of breath/ acute on chronic CHF exacerbation. Patient has history including, but not limited to, CHF, CAD with CABG x 4 in 2016, paroxysmal Atrial fibrillation, HTN, HLD, and morbid obesity. Patient was discharged home from hospital visit to self-care without home health services. HIPAA/ identity verified with patient during phone calltoday.   Today, Mr. Jasmer reports that things are "going fine."   -- has been monitoring and recording his daily weights, using scale provided to him by [facility] CM. Weight range over last week has been 316-323 lbs, with weight this morning of 315.4 lbs.  Patient denied having symptoms of shortness of breath/ increased fatigue/ peripheral swelling with weight gain this week; states that his weights "go up and down," verbalizes understanding of CHF zones/ action plans.   -- has continued walking as much as possible around weather conditions, also reports that he has [facility] as his work schedule allows.  Patient reports that he has tolerated activity without problems, reports that he "goes slow" with activity.  -- self-health management of chronic disease state of CHF: Patient is able to discuss signs/ symptoms fluid overload/ CHF exacerbation; denies concerning symptoms over last week.  Patient denies further issues, concerns, or problems today. I confirmed that patient hasmy direct phone number, the main [facility] CM office phone number, and the [facility] CM 24-hour nurse advice phone number should issues arise prior to next scheduled  [facility] Community CM outreach.  Plan:  Mr. Moreno take his medications as they are prescribed and will attend all provider appointments.  Mr. Oquin contact his providers for any concerns, needs, issues, or problems that arise.  Mr. Puentes willcontinuemonitoring and recording his daily weights.  Mr. Rapa will continue reviewingeducational materials provided to him previouslyre: self-health management of CHF and Advanced Directive planning.  Mr. Hinerman will review and contact additional community resource agencies available to him regarding his stated need for food acquisition assistance/ needs.  [facility] Community CM outreach to continue with routine home visitnext week.  [name], RN, BSN, [employer] [facility] Care Management  [phone]

## 2017-01-24 ENCOUNTER — Encounter: Payer: Self-pay | Admitting: *Deleted

## 2017-01-24 ENCOUNTER — Other Ambulatory Visit: Payer: Self-pay | Admitting: *Deleted

## 2017-01-24 NOTE — Patient Outreach (Signed)
[facility]) Care Management  [place] Telephone Outreach, Routine Home Visit  01/24/2017  Charles Mckinney 05-01-1951 [id number]  YUUKI SKEENS is an 66 y.o. male referred to [place] after multiple recent ED and hospital visits. [name] most recent hospitalization was January 9-11, 2018 for shortness of breath/ acute on chronic CHF exacerbation. Patient has history including, but not limited to, CHF, CAD with CABG x 4 in 2016, paroxysmal Atrial fibrillation, HTN, HLD, and morbid obesity. Patient was discharged home from hospital visit to self-care without home health services. Patient completed most recent transition of care period without hospital readmission, and is now followed by [facility] RN CM for self-health management of chronic disease state of CHF.  HIPAA/ identity verified with patient in person at his hometoday.   Today, Mr. Housey reports that things are "going just fine."   -- self-health management of chronic disease state of CHF: Patient is able to discuss signs/ symptoms fluid overload/ CHF exacerbation; denies concerning symptoms over last week; has been monitoring and recording his daily weights, using scale provided to him by [facility] CM. Weight range over last month has been 316-323lbs, with weight this morning of 319lbs. Patient denied having symptoms of shortness of breath/ increased fatigue/ peripheral swelling with weight gain this week; states that his weights "go up and down," verbalizes accurate understanding of CHF zones/ action plans. Patient is no distress today during home visit.  -- has continuedwalking as much as possible around weather conditions, also reports that he has continued occasionally going to [employer] to ride stationary bicycle as his work schedule allows. Patient reports that he has tolerated activity without problems, reports that he "goes slow" with activity, and stops to rest if he becomes "too tired or  too SOB."  Positive reinforcement was provided to patient for increasing his activity as allowed.  Patient is able to ambulate around his home today without difficulty, and his gait is steady and purposeful.  Patient does not require the use of assistive devices with ambulation, and denies new falls.   -- Medications:  Patient denies any new changes to his medications and is able to accurately verbalize the general purpose/ dosing, and scheduling of his prescribed medications.  Patient denies questions or concerns around his medications.  -- [employer] needs:  Patient denies further resource assistance today; states he has reviewed resources provided to him at last [place] home visit.  -- Advanced Directive (AD) planning:  Patient reports he is still considering Advanced Directive planning; has not made a decision at the time of today's visit.     Patient denies further issues, concerns, or problems today. I confirmed that patient hasmy direct phone number, the main [facility] CM office phone number, and the [facility] CM 24-hour nurse advice phone number should issues arise prior to next scheduled [place] outreach.  We agreed that [facility] RN CM would contact patient again in the next 2-3 weeks, unless problems arise, at which time patient agrees to contact [facility] RN CM.  Subjective:  "I am doing pretty good... I think I have things under pretty good control and pretty know what to do to stay on top of my health."  Objective:    BP 128/78   Pulse 82   Resp 16   Wt (!) 319 lb 6.4 oz (144.9 kg)   SpO2 94%   BMI 45.83 kg/m    Review of Systems  Constitutional: Negative.  Negative for  fever.  Respiratory: Positive for shortness of breath. Negative for cough and wheezing.        Baseline (minimal) SOB with activity on RA   Cardiovascular: Positive for leg swelling. Negative for chest pain.       Minimal bilateral LE swelling; patient reports this is baseline  Gastrointestinal: Negative  for abdominal pain and nausea.  Genitourinary: Positive for frequency and urgency.       Diuretic therapy  Musculoskeletal: Negative for falls.  Neurological: Negative.  Negative for weakness.  Psychiatric/Behavioral: Negative for depression. The patient is not nervous/anxious.     Physical Exam  Constitutional: He is oriented to person, place, and time. He appears well-developed and well-nourished. No distress.  Cardiovascular: Normal rate, regular rhythm and intact distal pulses.  Exam reveals distant heart sounds.   Pulses:      Radial pulses are 2+ on the right side, and 2+ on the left side.  Distant heart sounds presumably secondary to morbid obesity  Respiratory: Effort normal and breath sounds normal. No respiratory distress. He has no wheezes. He has no rales.  GI: Soft. Bowel sounds are normal.  Musculoskeletal: He exhibits edema.  See ROS  Neurological: He is alert and oriented to person, place, and time.  Skin: Skin is warm and dry. No erythema.  Psychiatric: He has a normal mood and affect. His behavior is normal. Judgment and thought content normal.   Assessment:  Mr. Esguerra has recuperated well after his recent hospitalization and has benefited  from ongoing education around self-health management of chronic disease state of CHF.  Mr. Chaney has begun monitoring/ recording daily weights using [facility] CM provided scales.  Mr. Goldman denies need for additional community resources for food assistance today, stating that he has reviewed resources previously provided to him.  Mr. going lives alone and has limited support from his family.  Mr. Gautier continues to work and is able to drive himself to provider appointments.  Mr. Blanke is committed to compliance around his medication regimen and attending scheduled provider appointments.  Plan:   Mr. Byrns take his medications as they are prescribed and will attend all provider appointments.  Mr. Lynam contact his  providers for any concerns, needs, issues, or problems that arise.  Mr. Laverdiere willcontinuemonitoring and recording his daily weights.  Mr. Mcisaac will continue reviewingeducational materials provided to him previouslyre: self-health management of CHF and Advanced Directive planning.  Mr. [place] will review and contact additional community resource agencies available to him regarding his stated need for food acquisition assistance/ needs.  [place] outreach to continue with telephone call in 2-3 weeks.  [facility] CM Care Plan Problem One   Flowsheet Row Most Recent Value  Care Plan Problem One  Risk for hospital re-admission secondary to multiple recent ED visits and hospital admissions  Role Documenting the Problem One  Care Management [place] for Problem One  Not Active  [facility] Long Term Goal (31-90 days)  Within the next 31 days, patient will not experience hospital readmission, as evidenced by patient reporting during [facility] CM outreach and review of EMR  [place] Term Goal Start Date  12/16/16  [facility] Long Term Goal Met Date  01/16/17  Interventions for Problem One Long Term Goal  Utilizing teachback method, provided positive reinforcement for patient taking medications as prescribed, performing daily weight monitoring, increasing activity as allowed  [facility] CM Short Term Goal #1 (0-30 days)  Within the next 7 days, patient will meet with  [facility] Community RN CM for initial home visit, as evidenced by successful completion of [place] initial home visit  [facility] CM Short Term Goal #1 Start Date  12/16/16  [facility] CM Short Term Goal #1 Met Date  12/20/16  Interventions for Short Term Goal #1  [facility] Community CM services discussed with patient, and initial home visit was scheduled today    [facility] CM Care Plan Problem Two   Flowsheet Row Most Recent Value  Care Plan Problem Two  Knowledge deficit regarding self-health management of chronic disease state of CHF  Role Documenting the  Problem Two  Care Management Coordinator  Care Plan for Problem Two  Active  Interventions for Problem Two Long Term Goal   Utilizing teachback method, reviewed patient's recorded daily weights and reiterated education regarding role daily weights play in self-health management of CHF,  provided positive reinforcement that patient has continued monitoring and recording daily weights thus far  [facility] Long Term Goal (31-90) days  Over the next 60 days, patient will begin monitoring and recording his daily weights, as evidenced by review of recorded weights during [place] outreach  [facility] Long Term Goal Start Date  12/20/16  [facility] CM Short Term Goal #1 (0-30 days)  Over the next 14 days, patient will obtain new home scales for in-home daily weight monitoring, as evidenced by patient reporting during [place] outreach  [facility] CM Short Term Goal #1 Start Date  12/20/16  [facility] CM Short Term Goal #1 Met Date   12/29/16  Interventions for Short Term Goal #2   Discussed with patient that I have obtained scales for him and scheduled time for me to provide scales to him  [facility] CM Short Term Goal #2 (0-30 days)  Over the next 30 days, patient will be able to verbalize signs and symptoms of CHF yellow zone, along with action plan, as evidenced by patient reporting during [place] outreach  [facility] CM Short Term Goal #2 Start Date  12/20/16  Interventions for Short Term Goal #2  Reiterated education previously provided to patient and provided positive reinforcement that patient is able to discuss signs/ symptoms CHF exacerbation     Oneta Rack, RN, BSN, [place] Coordinator [facility] Care Management  [phone]

## 2017-01-30 DIAGNOSIS — J208 Acute bronchitis due to other specified organisms: Secondary | ICD-10-CM | POA: Diagnosis not present

## 2017-01-30 DIAGNOSIS — I5022 Chronic systolic (congestive) heart failure: Secondary | ICD-10-CM | POA: Diagnosis not present

## 2017-01-30 DIAGNOSIS — I482 Chronic atrial fibrillation: Secondary | ICD-10-CM | POA: Diagnosis not present

## 2017-01-30 DIAGNOSIS — R072 Precordial pain: Secondary | ICD-10-CM | POA: Diagnosis not present

## 2017-02-10 ENCOUNTER — Encounter (HOSPITAL_COMMUNITY): Payer: Self-pay

## 2017-02-10 ENCOUNTER — Emergency Department (HOSPITAL_COMMUNITY)
Admission: EM | Admit: 2017-02-10 | Discharge: 2017-02-10 | Disposition: A | Discharge status: Home / Self Care | Payer: Medicare Other | Attending: Emergency Medicine | Admitting: Emergency Medicine

## 2017-02-10 DIAGNOSIS — I5023 Acute on chronic systolic (congestive) heart failure: Secondary | ICD-10-CM | POA: Diagnosis not present

## 2017-02-10 DIAGNOSIS — H539 Unspecified visual disturbance: Secondary | ICD-10-CM | POA: Diagnosis not present

## 2017-02-10 DIAGNOSIS — Z7982 Long term (current) use of aspirin: Secondary | ICD-10-CM | POA: Insufficient documentation

## 2017-02-10 DIAGNOSIS — R7989 Other specified abnormal findings of blood chemistry: Secondary | ICD-10-CM | POA: Diagnosis not present

## 2017-02-10 DIAGNOSIS — I10 Essential (primary) hypertension: Secondary | ICD-10-CM | POA: Diagnosis not present

## 2017-02-10 DIAGNOSIS — I110 Hypertensive heart disease with heart failure: Secondary | ICD-10-CM | POA: Diagnosis not present

## 2017-02-10 DIAGNOSIS — H353213 Exudative age-related macular degeneration, right eye, with inactive scar: Secondary | ICD-10-CM | POA: Diagnosis not present

## 2017-02-10 DIAGNOSIS — H538 Other visual disturbances: Secondary | ICD-10-CM | POA: Diagnosis not present

## 2017-02-10 DIAGNOSIS — H3562 Retinal hemorrhage, left eye: Secondary | ICD-10-CM | POA: Diagnosis not present

## 2017-02-10 DIAGNOSIS — H353221 Exudative age-related macular degeneration, left eye, with active choroidal neovascularization: Secondary | ICD-10-CM | POA: Diagnosis not present

## 2017-02-10 DIAGNOSIS — Z7901 Long term (current) use of anticoagulants: Secondary | ICD-10-CM | POA: Insufficient documentation

## 2017-02-10 DIAGNOSIS — Z87891 Personal history of nicotine dependence: Secondary | ICD-10-CM | POA: Diagnosis not present

## 2017-02-10 DIAGNOSIS — Z951 Presence of aortocoronary bypass graft: Secondary | ICD-10-CM | POA: Diagnosis not present

## 2017-02-10 NOTE — Discharge Instructions (Signed)
Call Dr Christiana Fuchs at [phone] when you are in the car and leaving [facility] and on the way to his office. Go to his office immediately upon leaving here. His office is: [facility] [address] [place] [address]   Get your blood pressure recheck within the next weekToday's was elevated at 155/118

## 2017-02-10 NOTE — ED Notes (Signed)
Visual acuity:  Bilateral:20/100 L eye:0 R eye:20/100

## 2017-02-10 NOTE — ED Provider Notes (Addendum)
[facility]-EMERG[facility]Y DEPT Provider Note   CSN: [id number] Arrival date & time: 02/10/17  1529     History   Chief Complaint Chief Complaint  Patient presents with  . Blurred Vision    HPI Charles Mckinney is a 66 y.o. male.  HPI [place] of blurred vision gradual onset in his left eye onset 10:30 PM yesterday. He denies pain denies diplopia denies eye redness. Denies trouble speaking or trouble with coordination. No treatment prior to coming here. No other associated symptoms. Nothing makes symptoms better or worse Past Medical History:  Diagnosis Date  . Anxiety   . CHF (congestive heart failure) (HCC)   . Dysrhythmia    ATRIAL FIBRILATION, resolved with cardioversion  . GERD (gastroesophageal reflux disease)   . Hyperlipidemia   . Hypertension   . Hypertensive heart disease   . Kidney stones   . Obesity (BMI 30-39.9)   . Osteoarthritis   . PONV (postoperative nausea and vomiting)   . Shortness of breath dyspnea     Patient Active Problem List   Diagnosis Date Noted  . 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 Acute on chronic systolic congestive heart failure (HCC) 01/19/2015  . Chronic systolic heart failure (HCC) 01/19/2015  . 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 Long-term (current) use of anticoagulants 01/19/2015  . Paroxysmal atrial fibrillation (HCC)   . GERD 11/22/2007  . Hyperlipidemia   . Morbid obesity (HCC)   . Hypertensive heart disease     Past Surgical History:  Procedure Laterality Date  . CARDIAC CATHETERIZATION N/A 04/09/2015   Procedure: Right/Left Heart Cath and Coronary Angiography;  Surgeon: Orpah Cobb, MD;  [facility] INVASIVE CV LAB CUPID;  Service: Cardiovascular;  Laterality: N/A;  . CARDIOVERSION N/A 01/19/2015   Procedure: CARDIOVERSION;  Surgeon: Othella Boyer, MD;  Location: [facility] ENDOSCOPY;  Service: Cardiovascular;  Laterality: N/A;  pt in Afib, 12:22 synched cardioversion @  120 joules using Propofol 100 mg,IV....unsuccessful,  repeated at 200 joules  . CARDIOVERSION N/A 04/02/2015   Procedure: CARDIOVERSION;  Surgeon: Orpah Cobb, MD;  Location[facility] ENDOSCOPY;  Service: Cardiovascular;  Laterality: N/A;  . CATARACT EXTRACTION    . CLIPPING OF ATRIAL APPENDAGE  04/13/2015   Procedure: CLIPPING OF ATRIAL APPENDAGE;  Surgeon: Alleen Borne, MD;  Location: [facility] OR;  Service: Open Heart Surgery;;  . CORONARY ARTERY BYPASS GRAFT N/A 04/13/2015   Procedure: CORONARY ARTERY BYPASS GRAFTING (CABG);  Surgeon: Alleen Borne, MD;  Location: [facility] OR;  Service: Open Heart Surgery;  Laterality: N/A;  . TEE WITHOUT CARDIOVERSION N/A 04/13/2015   Procedure: TRANSESOPHAGEAL ECHOCARDIOGRAM (TEE);  Surgeon: Alleen Borne, MD;  Location: [facility] OR;  Service: Open Heart Surgery;  Laterality: N/A;       Home Medications    Prior to Admission medications   Medication Sig Start Date End Date Taking? Authorizing Provider  acetaminophen (TYLENOL) 500 MG tablet Take 1,000 mg by mouth every 6 (six) hours as needed for moderate pain. Reported on 03/11/2016    Historical Provider, MD  albuterol (PROAIR HFA) 108 (90 BASE) MCG/ACT inhaler Inhale 1 puff into the lungs every 6 (six) hours as needed for wheezing or shortness of breath. 09/25/15   [name], MD  allopurinol (ZYLOPRIM) 100 MG tablet TAKE 1 TABLET(100 MG) BY MOUTH DAILY 01/09/17   [name], MD  amiodarone (PACERONE) 200 MG tablet TAKE 1 TABLET(200 MG) BY MOUTH DAILY 11/11/16   [name], MD  aspirin 81 MG  EC tablet Take 1 tablet (81 mg total) by mouth daily. Swallow whole. Patient taking differently: Take 81 mg by mouth daily. Swallow whole. Patient states he is taking this medication every day 09/03/12   [name], MD  benzonatate (TESSALON) 100 MG capsule Take 1 capsule (100 mg total) by mouth every 8 (eight) hours. Patient not taking: Reported on 01/24/2017 10/04/16   Rise Mu, PA-C  carvedilol (COREG) 6.25 MG tablet TAKE 1 TABLET(6.25 MG) BY MOUTH  TWICE DAILY WITH A MEAL 12/27/16   [name], MD  clotrimazole (LOTRIMIN) 1 % cream Apply 1 application topically 2 (two) times daily. 09/25/15   [name], MD  fluticasone (FLONASE) 50 MCG/ACT nasal spray Place 1 spray into both nostrils daily.    Historical Provider, MD  furosemide (LASIX) 40 MG tablet Take 2 tablets (80 mg total) by mouth daily. Take 80 mg in AM and 40 in late afternoon 12/15/16   Calvert Cantor, MD  losartan (COZAAR) 25 MG tablet Take 1 tablet (25 mg total) by mouth daily. 12/15/16   Calvert Cantor, MD  ondansetron (ZOFRAN ODT) 4 MG disintegrating tablet 4mg  ODT q4 hours prn nausea/vomit 09/21/15   Mirian Mo, MD  potassium chloride SA (K-DUR,KLOR-CON) 20 MEQ tablet Take 2 tablets (40 mEq total) by mouth daily. 12/15/16   Calvert Cantor, MD  pravastatin (PRAVACHOL) 40 MG tablet TAKE 1 TABLET(40 MG) BY MOUTH DAILY 12/27/16   [name], MD  rivaroxaban (XARELTO) 20 MG TABS tablet Take 1 tablet (20 mg total) by mouth daily with supper. 09/25/15   [name], MD    Family History Family History  Problem Relation Age of Onset  . Diabetes Mother   . Heart disease Father   . Stroke Brother   . Heart disease Brother   . Diabetes Brother     Social History Social History  Substance Use Topics  . Smoking status: Former Smoker    Quit date: 2000  . Smokeless tobacco: Never Used  . Alcohol use No     Allergies   Niacin and related and Penicillins   Review of Systems Review of Systems  Constitutional: Negative.   HENT: Positive for dental problem.        Dental pain which is mild  Eyes: Positive for visual disturbance.  Respiratory: Negative.   Cardiovascular: Negative.   Gastrointestinal: Negative.   Musculoskeletal: Negative.   Skin: Negative.   Neurological: Negative.   Psychiatric/Behavioral: Negative.   All other systems reviewed and are negative.    Physical Exam Updated Vital Signs BP (!) 162/107 (BP Location: Left  Arm)   Pulse 119   Temp 98.5 F (36.9 C) (Oral)   Resp 21   Ht 5\' 10"  (1.778 m)   Wt (!) 318 lb (144.2 kg)   SpO2 97%   BMI 45.63 kg/m   Physical Exam  Constitutional: He is oriented to person, place, and time. He appears well-developed and well-nourished.  HENT:  Head: Normocephalic and atraumatic.  Eyes: Conjunctivae are normal. Pupils are equal, round, and reactive to light.  Visual acuity right eye 20/100; left eye he is able to finger count at the periphery in all visual fields. He is unable to finger count at his central visual field.  Neck: Neck supple. No tracheal deviation present. No thyromegaly present.  Cardiovascular: Normal rate and regular rhythm.   No murmur heard. Pulmonary/Chest: Effort normal and breath sounds normal.  Abdominal: Soft. Bowel sounds are normal. He  exhibits no distension. There is no tenderness.  Musculoskeletal: Normal range of motion. He exhibits no edema or tenderness.  Neurological: He is alert and oriented to person, place, and time. No cranial nerve deficit. He exhibits normal muscle tone. Coordination normal.  Gait normal  Skin: Skin is warm and dry. No rash noted.  Psychiatric: He has a normal mood and affect.  Nursing note and vitals reviewed.    ED Treatments / Results  Labs (all labs ordered are listed, but only abnormal results are displayed) Labs Reviewed - No data to display  EKG  EKG Interpretation None       Radiology No results found.  Procedures Procedures (including critical care time)  Medications Ordered in ED Medications - No data to display   Initial Impression / Assessment and Plan / ED Course  I have reviewed the triage vital signs and the nursing notes.  Pertinent labs & imaging results that were available during my care of the patient were reviewed by me and considered in my medical decision making (see chart for details).     Dr. Sherryll Burger consulted by telephone and will evaluate patient immediately  upon leaving here to office. Suspect macular degeneration as opposed to central retinal artery or central retinal vein occlusion as patient does not have complete visual patient is also advised to get his blood pressure rechecked within the next week. He will not be driving to Dr.Shah's office.  Final Clinical Impressions(s) / ED Diagnoses  Dx #1visual disturbance of left eye #2 elevated blood pressure Final diagnoses:  None    New Prescriptions New Prescriptions   No medications on file     Doug Sou, MD 02/10/17 1913    Doug Sou, MD 02/10/17 [phone]

## 2017-02-10 NOTE — ED Triage Notes (Signed)
Pt reports blurry vision to left eye. He noticed the blurred vision last night and woke up this morning and was still blurry. He reports having trouble reading as well. He always has had blurry vision with RIGHT eye due to macular degeneration but now its his left eye is blurry. Denies numbness, tingling or weakness. Speech clear, speaking complete sentences.

## 2017-02-10 NOTE — ED Notes (Signed)
Dr. J at bedside 

## 2017-02-10 NOTE — ED Notes (Signed)
Papers reviewed with patient and he verbalizes understanding. Son picking up patient and going directly to eye doctors appt.

## 2017-02-13 ENCOUNTER — Other Ambulatory Visit: Payer: Self-pay | Admitting: *Deleted

## 2017-02-13 ENCOUNTER — Encounter: Payer: Self-pay | Admitting: *Deleted

## 2017-02-13 NOTE — Patient Outreach (Signed)
[employer] service manager [facility]) Care Management [facility] Community CM Telephone Outreach  02/13/2017  Charles Mckinney 25-Aug-1951 [id number]  Successful telephone outreach to Charles Mckinney, 66 y.o. male originally referred to [facility] Community CM for multiple ED and hospital visits. [name] most recent hospitalization was January 9-11, 2018 for shortness of breath/ acute on chronic CHF exacerbation. Patient has history including, but not limited to, CHF, CAD with CABG x 4 in 2016, paroxysmal Atrial fibrillation, HTN, HLD, and morbid obesity. Patient was discharged home from hospital visit to self-care without home health services. Patient completed most recent transition of care period without hospital readmission, and is now followed by [facility] RN CM for self-health management of chronic disease state of CHF.  HIPAA/ identity verified with patient during phone calltoday.   Today, Charles Mckinney reports that things are "not going very good." States that he had to go to ED on Friday 02/10/17, because he had sudden changes in his vision; was seen urgently by eye specialist, and had follow up appointment scheduled today, which had to be cancelled due to snowy weather; patient reports eye doctor follow up was scheduled for this Wednesday 02/15/17.  States that the eye doctor told him he had experienced a bursted blood vessel in his (L) eye.  Denies pain today, denies new falls today.  -- self-health management of chronic disease state of CHF: Patient is able to discuss signs/ symptoms fluid overload/ CHF exacerbation; denies concerning symptoms over last week; has not been monitoring and recording his daily weights since his onset of visual disturbance last week, as he reports he can not see scale reading accurately.  Patient denied having symptoms of shortness of breath/ increased fatigue/ peripheral swelling with weight gain this week and sounds to be in no distress today phone call today.  Patient was advised to  resume daily weights as soon his eye issue resolves, and he agrees to do so.  -- Medications:  Patient denies any new changes to or questions about his medications.  -- Provider appointments:  No upcoming provider appointments noted.  Patient was encouraged to contact providers for any new concerns, issues, or problems.  Patient denies further issues, concerns, or problems today. I confirmed that patient hasmy direct phone number, the main [facility] CM office phone number, and the [facility] CM 24-hour nurse advice phone number should issues arise prior to next scheduled [facility] Community CM outreach.  We agreed that [facility] RN CM would contact patient again in 2 weeks, unless problems arise, at which time patient agrees to contact [facility] RN CM.  Plan:   Charles Mckinney take his medications as they are prescribed and will attend provider appointment scheduled for later this week for evaluation of his acute eye issue last week.  Charles Mckinney contact his providers for any concerns, needs, issues, or problems that arise.  Charles Mckinney willcontinuemonitoring and recording his daily weights as allowed around his current visual disturbance.  [facility] Community CM outreach to continue with telephone call in 2 weeks.  Charles Pina, RN, BSN, [employer] [facility] Care Management  [phone]

## 2017-02-14 ENCOUNTER — Other Ambulatory Visit: Payer: Self-pay | Admitting: Internal Medicine

## 2017-02-15 DIAGNOSIS — H26491 Other secondary cataract, right eye: Secondary | ICD-10-CM | POA: Diagnosis not present

## 2017-02-15 DIAGNOSIS — H353211 Exudative age-related macular degeneration, right eye, with active choroidal neovascularization: Secondary | ICD-10-CM | POA: Diagnosis not present

## 2017-02-15 DIAGNOSIS — H26492 Other secondary cataract, left eye: Secondary | ICD-10-CM | POA: Diagnosis not present

## 2017-02-21 DIAGNOSIS — H26491 Other secondary cataract, right eye: Secondary | ICD-10-CM | POA: Diagnosis not present

## 2017-02-27 ENCOUNTER — Other Ambulatory Visit: Payer: Self-pay | Admitting: *Deleted

## 2017-02-27 ENCOUNTER — Encounter: Payer: Self-pay | Admitting: *Deleted

## 2017-02-27 NOTE — Patient Outreach (Signed)
[facility]) Care Management [place] Telephone Outreach  02/27/2017  Charles Mckinney 1951/10/23 [id number]  Successful telephone outreach to Charles Mckinney, 65 y.o.maleoriginally referred to [place] for multiple ED and hospital visits. [name] most recent hospitalization was January 9-11, 2018 for shortness of breath/ acute on chronic CHF exacerbation. Patient has history including, but not limited to, CHF, CAD with CABG x 4 in 2016, paroxysmal Atrial fibrillation, HTN, HLD, and morbid obesity. Patient was discharged home from hospital visit to self-care without home health services. Patient completed most recent transition of care period without hospital readmission, and was followed by [facility] RN CM for self-health management of chronic disease state of CHF. HIPAA/ identity verified with patient during phone calltoday.   Today, Charles Mckinney reports that things are "going pretty good." Reports that he has continued following up on his recent (L) eye issue with eye specialist, and has an appointment today for same.  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  Denies pain and new falls today.   -- self-health management of chronic disease state of CHF: Patient is able to discuss signs/ symptoms fluid overload/ CHF exacerbation; denies concerning symptoms over last week; has not been monitoring and recording his daily weights since his onset of visual disturbance,  as he reports he can not see scale reading accurately.  States that he will resume daily weights when his eye issue is resolved.  Patient denied having symptoms of shortness of breath/ increased fatigue/ peripheral swelling with weight gain this week and sounds to be in no distress today phone call today.  Patient was advised to resume  daily weights as soon his eye issue resolves, and he again agrees to do so.  -- Medications: Patient denies any new changes to or questions about his medications.  -- Provider appointments:  No upcoming provider appointments noted.  Patient was encouraged to contact providers for any new concerns, issues, or problems.  Following up with eye specialist is ongoing.  Patient denies further issues, concerns, or problems today. Discussed with patient that he has successfully met his previously established [place] CM goals, and we agreed that he is ready for [facility] CM discharge/ case closure.  I confirmed that patient hasmy direct phone number, the main [facility] CM office phone number, and the [facility] CM 24-hour nurse advice phone number should issues arise in the future.  Plan:   [facility] Community CM case closure, as patient has successfully met his previously established [facility] CM goals; will notify patient's PCP of same.   It has been a pleasure participating in Charles Mckinney's care,  Charles Rack, RN, BSN, [employer] Coordinator [facility] Care Management  [phone]

## 2017-03-06 ENCOUNTER — Other Ambulatory Visit: Payer: Self-pay | Admitting: *Deleted

## 2017-03-06 ENCOUNTER — Other Ambulatory Visit: Payer: Self-pay | Admitting: Internal Medicine

## 2017-03-06 ENCOUNTER — Ambulatory Visit
Admission: RE | Admit: 2017-03-06 | Discharge: 2017-03-06 | Disposition: A | Discharge status: Home / Self Care | Payer: Medicare Other | Source: Ambulatory Visit | Attending: Cardiovascular Disease | Admitting: Cardiovascular Disease

## 2017-03-06 ENCOUNTER — Encounter: Payer: Self-pay | Admitting: *Deleted

## 2017-03-06 ENCOUNTER — Telehealth: Payer: Self-pay | Admitting: Internal Medicine

## 2017-03-06 ENCOUNTER — Other Ambulatory Visit: Payer: Self-pay | Admitting: Cardiovascular Disease

## 2017-03-06 DIAGNOSIS — I482 Chronic atrial fibrillation: Secondary | ICD-10-CM | POA: Diagnosis not present

## 2017-03-06 DIAGNOSIS — R0602 Shortness of breath: Secondary | ICD-10-CM

## 2017-03-06 DIAGNOSIS — I5022 Chronic systolic (congestive) heart failure: Secondary | ICD-10-CM

## 2017-03-06 NOTE — Telephone Encounter (Signed)
Dr. Roseanne Kaufman office is calling to obtain a referral for cardiology.

## 2017-03-06 NOTE — Telephone Encounter (Signed)
Referral order placed in epic

## 2017-03-06 NOTE — Patient Outreach (Signed)
[facility]) Care Management [place] Telephone Outreach  03/06/2017  Charles Mckinney [birth year] [id number]  Received voicemail message from Doreene Burke y.o.maleoriginally referred to [place] formultiple ED and hospital visits. [name] most recent hospitalization was January 9-11, 2018 for shortness of breath/ acute on chronic CHF exacerbation. Patient has history including, but not limited to, CHF, CAD with CABG x 4 in 2016, paroxysmal Atrial fibrillation, HTN, HLD, and morbid obesity. Patient was discharged home from hospital visit to self-care without home health services. Patient completed most recent transition of care period without hospital readmission, and was followed by [facility] RN CM for self-health management of chronic disease state of CHF.  [facility] Community CM case closure 02/27/17, as patient successfully met goals.  Returned patient's call this morning; HIPAA/ identity verified with patient during phone calltoday.   Today, [name] reports that he found out last week that he has temporarily lost his job due to his recent eye injury/ vision changes; stated that they will try to place him in another job when his eye problems resolve.  [name] reports that he is "stressed out" over this news, and states that he has had intermittent shortness of breath above his baseline.  States that he is able to do his normal ADL's and that he was able to walk up and down his street this morning, cook himself breakfast of bacon and eggs.  Reports that his biggest issue is when he tries to lie down to sleep, stating, "I can't seem to lie down without having trouble."  [name] is in no obvious distress during our 10 minute phone conversation this morning, and is able to talk in complete sentences without difficulty or audible shortness of breath.  Reports that he is taking medications as ordered and using inhaler medications.  CHF zones reiterated with  patient, and patient was encouraged to contact his medical providers now in attempt to get an urgent office visit to follow up on his reported symptoms.  Reasons to go to the ED were also discussed with patient today.  Patient reported that he was going to call his cardiologist this morning to report symptoms/ request urgent office visit  Patient denies further issues, concerns, or problems today. I confirmed that patient hasmy direct phone number, the main [facility] CM office phone number, and the [facility] CM 24-hour nurse advice phone number should issues arise in the future.  Oneta Rack, RN, BSN, [employer] [facility] Care Management  [phone]

## 2017-03-28 DIAGNOSIS — H353221 Exudative age-related macular degeneration, left eye, with active choroidal neovascularization: Secondary | ICD-10-CM | POA: Diagnosis not present

## 2017-03-31 ENCOUNTER — Other Ambulatory Visit: Payer: Self-pay | Admitting: Internal Medicine

## 2017-03-31 DIAGNOSIS — I5022 Chronic systolic (congestive) heart failure: Secondary | ICD-10-CM

## 2017-04-02 ENCOUNTER — Encounter: Payer: Self-pay | Admitting: Internal Medicine

## 2017-04-02 LAB — FECAL OCCULT BLOOD, GUAIAC: Fecal Occult Blood: POSITIVE

## 2017-04-07 ENCOUNTER — Other Ambulatory Visit: Payer: Medicare Other

## 2017-04-10 ENCOUNTER — Other Ambulatory Visit: Payer: Medicare Other

## 2017-04-14 ENCOUNTER — Ambulatory Visit (INDEPENDENT_AMBULATORY_CARE_PROVIDER_SITE_OTHER): Payer: Medicare Other | Admitting: Internal Medicine

## 2017-04-14 ENCOUNTER — Encounter: Payer: Self-pay | Admitting: Internal Medicine

## 2017-04-14 VITALS — BP 138/70 | HR 100 | Temp 98.3°F | Wt 323.0 lb

## 2017-04-14 DIAGNOSIS — Z951 Presence of aortocoronary bypass graft: Secondary | ICD-10-CM

## 2017-04-14 DIAGNOSIS — G4733 Obstructive sleep apnea (adult) (pediatric): Secondary | ICD-10-CM

## 2017-04-14 DIAGNOSIS — I5022 Chronic systolic (congestive) heart failure: Secondary | ICD-10-CM | POA: Diagnosis not present

## 2017-04-14 DIAGNOSIS — Z0000 Encounter for general adult medical examination without abnormal findings: Secondary | ICD-10-CM | POA: Diagnosis not present

## 2017-04-14 DIAGNOSIS — E7800 Pure hypercholesterolemia, unspecified: Secondary | ICD-10-CM

## 2017-04-14 DIAGNOSIS — I480 Paroxysmal atrial fibrillation: Secondary | ICD-10-CM

## 2017-04-14 NOTE — Progress Notes (Signed)
Subjective:    Patient ID: Charles Mckinney, male    DOB: 1951/11/07, 66 y.o.   MRN: [id number]  HPI 66 year old patient who is seen today for a preventive health examination and Medicare wellness visit. He was hospitalized in January for systolic heart failure and atrial fibrillation.  He states he was seen by cardiology near Easter and anticoagulation was discontinued following a left ocular hemorrhage remains on aspirin therapy. He has a history of paroxysmal atrial fibrillation and dyslipidemia.  He has morbid obesity. Unclear whether he has been evaluated for OSA  He is also followed the [place].  He is requesting paperwork to be completed 4 additional benefits  Past Medical History:  Diagnosis Date  . Anxiety   . CHF (congestive heart failure) (HCC)   . Dysrhythmia    ATRIAL FIBRILATION, resolved with cardioversion  . GERD (gastroesophageal reflux disease)   . Hyperlipidemia   . Hypertension   . Hypertensive heart disease   . Kidney stones   . Obesity (BMI 30-39.9)   . Osteoarthritis   . PONV (postoperative nausea and vomiting)   . Shortness of breath dyspnea      Social History   Social History  . Marital status: Widowed    Spouse name: N/A  . Number of children: N/A  . Years of education: N/A   Occupational History  . disabled    Social History Main Topics  . Smoking status: Former Smoker    Quit date: 2000  . Smokeless tobacco: Never Used  . Alcohol use No  . Drug use: No  . Sexual activity: Yes   Other Topics Concern  . Not on file   Social History Narrative  . No narrative on file    Past Surgical History:  Procedure Laterality Date  . CARDIAC CATHETERIZATION N/A 04/09/2015   Procedure: Right/Left Heart Cath and Coronary Angiography;  Surgeon: Orpah Cobb, MD;  Location: [facility] INVASIVE CV LAB CUPID;  Service: Cardiovascular;  Laterality: N/A;  . CARDIOVERSION N/A 01/19/2015   Procedure: CARDIOVERSION;  Surgeon: Othella Boyer, MD;  Location: [facility]  ENDOSCOPY;  Service: Cardiovascular;  Laterality: N/A;  pt in Afib, 12:22 synched cardioversion @  120 joules using Propofol 100 mg,IV....unsuccessful, repeated at 200 joules  . CARDIOVERSION N/A 04/02/2015   Procedure: CARDIOVERSION;  Surgeon: Orpah Cobb, MD;  Location: [facility] ENDOSCOPY;  Service: Cardiovascular;  Laterality: N/A;  . CATARACT EXTRACTION    . CLIPPING OF ATRIAL APPENDAGE  04/13/2015   Procedure: CLIPPING OF ATRIAL APPENDAGE;  Surgeon: Alleen Borne, MD;  Location: [facility] OR;  Service: Open Heart Surgery;;  . CORONARY ARTERY BYPASS GRAFT N/A 04/13/2015   Procedure: CORONARY ARTERY BYPASS GRAFTING (CABG);  Surgeon: Alleen Borne, MD;  Location[facility] OR;  Service: Open Heart Surgery;  Laterality: N/A;  . TEE WITHOUT CARDIOVERSION N/A 04/13/2015   Procedure: TRANSESOPHAGEAL ECHOCARDIOGRAM (TEE);  Surgeon: Alleen Borne, MD;  Location: [facility] OR;  Service: Open Heart Surgery;  Laterality: N/A;    Family History  Problem Relation Age of Onset  . Diabetes Mother   . Heart disease Father   . Stroke Brother   . Heart disease Brother   . Diabetes Brother     Allergies  Allergen Reactions  . Niacin And Related Other (See Comments)    FLUSHING  . Penicillins Itching and Rash    Has patient had a PCN reaction causing immediate rash, facial/tongue/throat swelling, SOB or lightheadedness with hypotension:YES Has patient had a PCN reaction causing severe  rash involving mucus membranes or skin necrosis: NO Has patient had a PCN reaction that required hospitalization NO Has patient had a PCN reaction occurring within the last 10 years: NO If all of the above answers are "NO", then may proceed with Cephalosporin use.    Current Outpatient Prescriptions on File Prior to Visit  Medication Sig Dispense Refill  . acetaminophen (TYLENOL) 500 MG tablet Take 1,000 mg by mouth every 6 (six) hours as needed for moderate pain. Reported on 03/11/2016    . allopurinol (ZYLOPRIM) 100 MG tablet TAKE 1 TABLET(100  MG) BY MOUTH DAILY 90 tablet 0  . amiodarone (PACERONE) 200 MG tablet TAKE 1 TABLET(200 MG) BY MOUTH DAILY 90 tablet 1  . aspirin 81 MG EC tablet Take 1 tablet (81 mg total) by mouth daily. Swallow whole. (Patient taking differently: Take 81 mg by mouth daily. Swallow whole. Patient states he is taking this medication every day) 30 tablet 12  . carvedilol (COREG) 6.25 MG tablet TAKE 1 TABLET(6.25 MG) BY MOUTH TWICE DAILY WITH A MEAL 180 tablet 0  . clotrimazole (LOTRIMIN) 1 % cream Apply 1 application topically 2 (two) times daily. 30 g 1  . fluticasone (FLONASE) 50 MCG/ACT nasal spray Place 1 spray into both nostrils daily.    . furosemide (LASIX) 40 MG tablet Take 2 tablets (80 mg total) by mouth daily. Take 80 mg in AM and 40 in late afternoon 180 tablet 3  . losartan (COZAAR) 25 MG tablet Take 1 tablet (25 mg total) by mouth daily. 30 tablet 0  . ondansetron (ZOFRAN ODT) 4 MG disintegrating tablet 4mg  ODT q4 hours prn nausea/vomit 15 tablet 0  . potassium chloride SA (K-DUR,KLOR-CON) 20 MEQ tablet Take 2 tablets (40 mEq total) by mouth daily. 90 tablet 3  . pravastatin (PRAVACHOL) 40 MG tablet TAKE 1 TABLET(40 MG) BY MOUTH DAILY 90 tablet 0  . PROAIR HFA 108 (90 Base) MCG/ACT inhaler INHALE 1 PUFF INTO THE LUNGS EVERY 6 HOURS AS NEEDED FOR WHEEZING OR SHORTNESS OF BREATH 8.5 g 0  . rivaroxaban (XARELTO) 20 MG TABS tablet Take 1 tablet (20 mg total) by mouth daily with supper. 30 tablet 12   No current facility-administered medications on file prior to visit.     BP 138/70 (BP Location: Left Wrist, Patient Position: Sitting, Cuff Size: Normal)   Pulse 100   Temp 98.3 F (36.8 C) (Oral)   Wt (!) 323 lb (146.5 kg)   SpO2 96%   BMI 46.35 kg/m   Medicare wellness visit  1. Risk factors, based on past  M,S,F history.  Patient has known coronary artery disease status post CABG.  Risk factors include hypertension and dyslipidemia.  2.  Physical activities: sedentary due to obesity DOE  secondary to heart failure as well as bilateral knee pain  3.  Depression/mood:no history of major depression or mood disorder  4.  Hearing:no major deficits  5.  ADL's:independent.  Walks with a cane  6.  Fall risk:moderately high.  Has obesity knee pain.  Walks with a cane  7.  Home safety:no problems identified  8.  Height weight, and visual acuity;height and weight stable followed closely ophthalmology following a left ocular hemorrhage  9.  Counseling:weight loss encouraged low-salt diet recommended  10. Lab orders based on risk factors:laboratory studies reviewed from recent hospital admission  11. Referral :follow-up cardiology and ophthalmology  12. Care plan:continue efforts at aggressive risk factor modification  13. Cognitive assessment: alert and with normal affect.  No cognitive dysfunction  14. Screening: Patient provided with a written and personalized 5-10 year screening schedule in the AVS.    15. Provider List Update: cardiology, ophthalmology primary care     Review of Systems  Constitutional: Positive for activity change. Negative for appetite change, chills, fatigue and fever.  HENT: Negative for congestion, dental problem, ear pain, hearing loss, sore throat, tinnitus, trouble swallowing and voice change.   Eyes: Positive for visual disturbance. Negative for pain and discharge.  Respiratory: Positive for shortness of breath. Negative for cough, chest tightness, wheezing and stridor.   Cardiovascular: Negative for chest pain, palpitations and leg swelling.  Gastrointestinal: Negative for abdominal distention, abdominal pain, blood in stool, constipation, diarrhea, nausea and vomiting.  Genitourinary: Negative for difficulty urinating, discharge, flank pain, genital sores, hematuria and urgency.  Musculoskeletal: Negative for arthralgias, back pain, gait problem, joint swelling, myalgias and neck stiffness.  Skin: Negative for rash.  Neurological: Positive  for weakness. Negative for dizziness, syncope, speech difficulty, numbness and headaches.  Hematological: Negative for adenopathy. Does not bruise/bleed easily.  Psychiatric/Behavioral: Negative for behavioral problems and dysphoric mood. The patient is not nervous/anxious.        Objective:   Physical Exam  Constitutional: He appears well-developed and well-nourished.  Weight 223 Blood pressure 130/70 Pulse 90 and irregular  HENT:  Head: Normocephalic and atraumatic.  Right Ear: External ear normal.  Left Ear: External ear normal.  Nose: Nose normal.  Mouth/Throat: Oropharynx is clear and moist.  Pharyngeal crowding Missing dentition  Eyes: Conjunctivae and EOM are normal. Pupils are equal, round, and reactive to light. No scleral icterus.  Decreased visual acuity on the left  Neck: Normal range of motion. Neck supple. No JVD present. No thyromegaly present.  Cardiovascular: Normal heart sounds and intact distal pulses.  Exam reveals no gallop and no friction rub.   No murmur heard. Rhythm is irregular  Pulmonary/Chest: Effort normal. He has rales. He exhibits no tenderness.  Status post sternotomy  Abdominal: Soft. Bowel sounds are normal. He exhibits no distension and no mass. There is no tenderness.  Obese Umbilical hernia  Genitourinary: Penis normal.  Genitourinary Comments: Stool heme-negative per [place] healthcare screening  Musculoskeletal: Normal range of motion. He exhibits edema. He exhibits no tenderness.  Plus 2 pedal edema  Lymphadenopathy:    He has no cervical adenopathy.  Neurological: He is alert. He has normal reflexes. No cranial nerve deficit. Coordination normal.  Skin: Skin is warm and dry. No rash noted.  Scattered sebaceous cyst of the back  Psychiatric: He has a normal mood and affect. His behavior is normal.          Assessment & Plan:   Preventive health exam Medicare wellness visit Chronic systolic heart failure Atrial fibrillation.   Patient appears to be in atrial fibrillation.  We'll check EKG and confirm.  Patient told to follow-up with cardiology next week Morbid obesity.  Will set up for home consult to evaluate for OSA   Cardiology follow-up Home sleep study  Return in 3 months  Charles Mckinney

## 2017-04-14 NOTE — Patient Instructions (Signed)
Limit your sodium (Salt) intake  Cardiology follow-up next week as discussed  Ophthalmology follow-up  You need to lose weight.  Consider a lower calorie diet and regular exercise.  Pulmonary consultation for evaluation of possible obstructive sleep apnea  Return in 3 months for follow-up

## 2017-04-17 ENCOUNTER — Other Ambulatory Visit: Payer: Self-pay | Admitting: Internal Medicine

## 2017-04-17 DIAGNOSIS — R0602 Shortness of breath: Secondary | ICD-10-CM | POA: Diagnosis not present

## 2017-04-17 DIAGNOSIS — E785 Hyperlipidemia, unspecified: Secondary | ICD-10-CM

## 2017-04-17 DIAGNOSIS — I5022 Chronic systolic (congestive) heart failure: Secondary | ICD-10-CM | POA: Diagnosis not present

## 2017-04-17 DIAGNOSIS — I482 Chronic atrial fibrillation: Secondary | ICD-10-CM | POA: Diagnosis not present

## 2017-04-24 DIAGNOSIS — I482 Chronic atrial fibrillation: Secondary | ICD-10-CM | POA: Diagnosis not present

## 2017-04-24 DIAGNOSIS — R072 Precordial pain: Secondary | ICD-10-CM | POA: Diagnosis not present

## 2017-04-24 DIAGNOSIS — I5022 Chronic systolic (congestive) heart failure: Secondary | ICD-10-CM | POA: Diagnosis not present

## 2017-04-24 DIAGNOSIS — R0602 Shortness of breath: Secondary | ICD-10-CM | POA: Diagnosis not present

## 2017-04-25 ENCOUNTER — Emergency Department (HOSPITAL_COMMUNITY): Payer: Medicare Other

## 2017-04-25 ENCOUNTER — Observation Stay (HOSPITAL_COMMUNITY)
Admission: EM | Admit: 2017-04-25 | Discharge: 2017-04-27 | Disposition: A | Discharge status: Home Health | Payer: Medicare Other | Attending: Internal Medicine | Admitting: Internal Medicine

## 2017-04-25 ENCOUNTER — Encounter (HOSPITAL_COMMUNITY): Payer: Self-pay

## 2017-04-25 DIAGNOSIS — R42 Dizziness and giddiness: Secondary | ICD-10-CM | POA: Diagnosis not present

## 2017-04-25 DIAGNOSIS — Z7982 Long term (current) use of aspirin: Secondary | ICD-10-CM | POA: Diagnosis not present

## 2017-04-25 DIAGNOSIS — E785 Hyperlipidemia, unspecified: Secondary | ICD-10-CM | POA: Diagnosis not present

## 2017-04-25 DIAGNOSIS — E876 Hypokalemia: Secondary | ICD-10-CM | POA: Insufficient documentation

## 2017-04-25 DIAGNOSIS — I4819 Other persistent atrial fibrillation: Secondary | ICD-10-CM | POA: Diagnosis present

## 2017-04-25 DIAGNOSIS — K219 Gastro-esophageal reflux disease without esophagitis: Secondary | ICD-10-CM | POA: Diagnosis not present

## 2017-04-25 DIAGNOSIS — I5023 Acute on chronic systolic (congestive) heart failure: Secondary | ICD-10-CM | POA: Insufficient documentation

## 2017-04-25 DIAGNOSIS — R269 Unspecified abnormalities of gait and mobility: Secondary | ICD-10-CM | POA: Diagnosis not present

## 2017-04-25 DIAGNOSIS — G459 Transient cerebral ischemic attack, unspecified: Secondary | ICD-10-CM | POA: Diagnosis present

## 2017-04-25 DIAGNOSIS — Z880 Allergy status to penicillin: Secondary | ICD-10-CM | POA: Diagnosis not present

## 2017-04-25 DIAGNOSIS — Z87891 Personal history of nicotine dependence: Secondary | ICD-10-CM | POA: Insufficient documentation

## 2017-04-25 DIAGNOSIS — Z87442 Personal history of urinary calculi: Secondary | ICD-10-CM | POA: Diagnosis not present

## 2017-04-25 DIAGNOSIS — Z888 Allergy status to other drugs, medicaments and biological substances status: Secondary | ICD-10-CM | POA: Insufficient documentation

## 2017-04-25 DIAGNOSIS — I480 Paroxysmal atrial fibrillation: Secondary | ICD-10-CM | POA: Insufficient documentation

## 2017-04-25 DIAGNOSIS — R9431 Abnormal electrocardiogram [ECG] [EKG]: Secondary | ICD-10-CM | POA: Diagnosis present

## 2017-04-25 DIAGNOSIS — I5042 Chronic combined systolic (congestive) and diastolic (congestive) heart failure: Secondary | ICD-10-CM | POA: Diagnosis present

## 2017-04-25 DIAGNOSIS — Z951 Presence of aortocoronary bypass graft: Secondary | ICD-10-CM | POA: Insufficient documentation

## 2017-04-25 DIAGNOSIS — Z79899 Other long term (current) drug therapy: Secondary | ICD-10-CM | POA: Diagnosis not present

## 2017-04-25 DIAGNOSIS — Z6838 Body mass index (BMI) 38.0-38.9, adult: Secondary | ICD-10-CM | POA: Insufficient documentation

## 2017-04-25 DIAGNOSIS — G450 Vertebro-basilar artery syndrome: Secondary | ICD-10-CM

## 2017-04-25 DIAGNOSIS — I110 Hypertensive heart disease with heart failure: Principal | ICD-10-CM | POA: Insufficient documentation

## 2017-04-25 DIAGNOSIS — I119 Hypertensive heart disease without heart failure: Secondary | ICD-10-CM | POA: Diagnosis present

## 2017-04-25 LAB — APTT: aPTT: 29 seconds (ref 24–36)

## 2017-04-25 LAB — HEPATIC FUNCTION PANEL
ALT: 19 U/L (ref 17–63)
AST: 22 U/L (ref 15–41)
Albumin: 3.5 g/dL (ref 3.5–5.0)
Alkaline Phosphatase: 60 U/L (ref 38–126)
Bilirubin, Direct: 0.1 mg/dL (ref 0.1–0.5)
Indirect Bilirubin: 0.8 mg/dL (ref 0.3–0.9)
Total Bilirubin: 0.9 mg/dL (ref 0.3–1.2)
Total Protein: 6.5 g/dL (ref 6.5–8.1)

## 2017-04-25 LAB — DIFFERENTIAL
BASOS ABS: 0.1 10*3/uL (ref 0.0–0.1)
BASOS PCT: 1 %
EOS ABS: 0.2 10*3/uL (ref 0.0–0.7)
Eosinophils Relative: 3 %
Lymphocytes Relative: 24 %
Lymphs Abs: 1.9 10*3/uL (ref 0.7–4.0)
Monocytes Absolute: 0.7 10*3/uL (ref 0.1–1.0)
Monocytes Relative: 10 %
NEUTROS PCT: 62 %
Neutro Abs: 4.7 10*3/uL (ref 1.7–7.7)

## 2017-04-25 LAB — CBC
HEMATOCRIT: 44.9 % (ref 39.0–52.0)
Hemoglobin: 14.9 g/dL (ref 13.0–17.0)
MCH: 29.7 pg (ref 26.0–34.0)
MCHC: 33.2 g/dL (ref 30.0–36.0)
MCV: 89.6 fL (ref 78.0–100.0)
Platelets: 213 10*3/uL (ref 150–400)
RBC: 5.01 MIL/uL (ref 4.22–5.81)
RDW: 13.9 % (ref 11.5–15.5)
WBC: 8 10*3/uL (ref 4.0–10.5)

## 2017-04-25 LAB — BASIC METABOLIC PANEL
Anion gap: 10 (ref 5–15)
BUN: 19 mg/dL (ref 6–20)
CALCIUM: 8.9 mg/dL (ref 8.9–10.3)
CO2: 28 mmol/L (ref 22–32)
Chloride: 103 mmol/L (ref 101–111)
Creatinine, Ser: 1.2 mg/dL (ref 0.61–1.24)
Glucose, Bld: 142 mg/dL — ABNORMAL HIGH (ref 65–99)
Potassium: 3.3 mmol/L — ABNORMAL LOW (ref 3.5–5.1)
SODIUM: 141 mmol/L (ref 135–145)

## 2017-04-25 LAB — BRAIN NATRIURETIC PEPTIDE: B Natriuretic Peptide: 146.2 pg/mL — ABNORMAL HIGH (ref 0.0–100.0)

## 2017-04-25 LAB — I-STAT TROPONIN, ED: TROPONIN I, POC: 0.01 ng/mL (ref 0.00–0.08)

## 2017-04-25 LAB — PROTIME-INR
INR: 1.09
PROTHROMBIN TIME: 14.1 s (ref 11.4–15.2)

## 2017-04-25 NOTE — ED Notes (Signed)
Dr. Dalene Seltzer informed of pts complaint of dizziness. No code stroke at this time.

## 2017-04-25 NOTE — ED Triage Notes (Signed)
Pt reports lightheadedness and dizziness onset today around 1500 associated with diarrhea. Pt in afib rate 107 at triage. He states he was told by his cardiologist last Monday that his heart "is out of rhythm" but was taken off Xarelto two weeks ago due to bleeding in his left eye. He denies any chest pain or SOB.

## 2017-04-25 NOTE — ED Provider Notes (Signed)
[facility]-EMERGENCY DEPT Provider Note   CSN: [id number] Arrival date & time: 04/25/17  1555     History   Chief Complaint Chief Complaint  Patient presents with  . Dizziness    HPI TARIAN BIBLER is a 66 y.o. male       The history is provided by the patient and medical records. No language interpreter was used.  Neurologic Problem  This is a new problem. The current episode started 6 to 12 hours ago. The problem occurs constantly. The problem has not changed since onset.Pertinent negatives include no chest pain, no abdominal pain, no headaches and no shortness of breath. The symptoms are aggravated by standing. Nothing relieves the symptoms. He has tried nothing for the symptoms. The treatment provided no relief.    Past Medical History:  Diagnosis Date  . Anxiety   . CHF (congestive heart failure) (HCC)   . Dysrhythmia    ATRIAL FIBRILATION, resolved with cardioversion  . GERD (gastroesophageal reflux disease)   . Hyperlipidemia   . Hypertension   . Hypertensive heart disease   . Kidney stones   . Obesity (BMI 30-39.9)   . Osteoarthritis   . PONV (postoperative nausea and vomiting)   . Shortness of breath dyspnea     Patient Active Problem List   Diagnosis Date Noted  . 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 Acute on chronic systolic congestive heart failure (HCC) 01/19/2015  . Chronic systolic heart failure (HCC) 01/19/2015  . Long-term (current) use of anticoagulants 01/19/2015  . Paroxysmal atrial fibrillation (HCC)   . GERD 11/22/2007  . Hyperlipidemia   . Morbid obesity (HCC)   . Hypertensive heart disease     Past Surgical History:  Procedure Laterality Date  . CARDIAC CATHETERIZATION N/A 04/09/2015   Procedure: Right/Left Heart Cath and Coronary Angiography;  Surgeon: Orpah Cobb, MD;  [facility]: [facility];  Service: Cardiovascular;  Laterality: N/A;  . CARDIOVERSION N/A 01/19/2015   Procedure: CARDIOVERSION;  Surgeon: Othella Boyer, MD;   Location: [facility] ENDOSCOPY;  Service: Cardiovascular;  Laterality: N/A;  pt in Afib, 12:22 synched cardioversion @  120 joules using Propofol 100 mg,IV....unsuccessful, repeated at 200 joules  . CARDIOVERSION N/A 04/02/2015   Procedure: CARDIOVERSION;  Surgeon: Orpah Cobb, MD;  Location: [facility] ENDOSCOPY;  Service: Cardiovascular;  Laterality: N/A;  . CATARACT EXTRACTION    . CLIPPING OF ATRIAL APPENDAGE  04/13/2015   Procedure: CLIPPING OF ATRIAL APPENDAGE;  Surgeon: Alleen Borne, MD;  Location: [facility] OR;  Service: Open Heart Surgery;;  . CORONARY ARTERY BYPASS GRAFT N/A 04/13/2015   Procedure: CORONARY ARTERY BYPASS GRAFTING (CABG);  Surgeon: Alleen Borne, MD;  Location: [facility] OR;  Service: Open Heart Surgery;  Laterality: N/A;  . TEE WITHOUT CARDIOVERSION N/A 04/13/2015   Procedure: TRANSESOPHAGEAL ECHOCARDIOGRAM (TEE);  Surgeon: Alleen Borne, MD;  Location: [facility] OR;  Service: Open Heart Surgery;  Laterality: N/A;       Home Medications    Prior to Admission medications   Medication Sig Start Date End Date Taking? Authorizing Provider  acetaminophen (TYLENOL) 500 MG tablet Take 1,000 mg by mouth every 6 (six) hours as needed for moderate pain. Reported on 03/11/2016    [provider]  allopurinol (ZYLOPRIM) 100 MG tablet TAKE 1 TABLET(100 MG) BY MOUTH DAILY 01/09/17   [name], MD  amiodarone (PACERONE) 200 MG tablet TAKE 1 TABLET(200 MG) BY MOUTH DAILY 11/11/16   [name], MD  aspirin 81 MG  EC tablet Take 1 tablet (81 mg total) by mouth daily. Swallow whole. Patient taking differently: Take 81 mg by mouth daily. Swallow whole. Patient states he is taking this medication every day 09/03/12   [name], MD  carvedilol (COREG) 6.25 MG tablet TAKE 1 TABLET(6.25 MG) BY MOUTH TWICE DAILY WITH A MEAL 03/31/17   [name], MD  clotrimazole (LOTRIMIN) 1 % cream Apply 1 application topically 2 (two) times daily. 09/25/15   [name], MD  fluticasone  (FLONASE) 50 MCG/ACT nasal spray Place 1 spray into both nostrils daily.    [provider]  furosemide (LASIX) 40 MG tablet Take 2 tablets (80 mg total) by mouth daily. Take 80 mg in AM and 40 in late afternoon 12/15/16   Calvert Cantor, MD  losartan (COZAAR) 25 MG tablet Take 1 tablet (25 mg total) by mouth daily. 12/15/16   Calvert Cantor, MD  ondansetron (ZOFRAN ODT) 4 MG disintegrating tablet 4mg  ODT q4 hours prn nausea/vomit 09/21/15   Mirian Mo, MD  potassium chloride SA (K-DUR,KLOR-CON) 20 MEQ tablet Take 2 tablets (40 mEq total) by mouth daily. 12/15/16   Calvert Cantor, MD  pravastatin (PRAVACHOL) 40 MG tablet TAKE 1 TABLET(40 MG) BY MOUTH DAILY 04/17/17   [name], MD  PROAIR HFA 108 [phone] Base) MCG/ACT inhaler INHALE 1 PUFF INTO THE LUNGS EVERY 6 HOURS AS NEEDED FOR WHEEZING OR SHORTNESS OF BREATH 02/14/17   [name], MD  rivaroxaban (XARELTO) 20 MG TABS tablet Take 1 tablet (20 mg total) by mouth daily with supper. 09/25/15   [name], MD    Family History Family History  Problem Relation Age of Onset  . Diabetes Mother   . Heart disease Father   . Stroke Brother   . Heart disease Brother   . Diabetes Brother     Social History Social History  Substance Use Topics  . Smoking status: Former Smoker    Quit date: 2000  . Smokeless tobacco: Never Used  . Alcohol use No     Allergies   Niacin and related and Penicillins   Review of Systems Review of Systems  Constitutional: Negative for activity change, chills, diaphoresis, fatigue and fever.  HENT: Negative for congestion and rhinorrhea.   Eyes: Negative for photophobia and visual disturbance.  Respiratory: Negative for cough, chest tightness, shortness of breath, wheezing and stridor.   Cardiovascular: Negative for chest pain, palpitations and leg swelling.  Gastrointestinal: Negative for abdominal distention, abdominal pain, blood in stool, constipation, diarrhea, nausea and  vomiting.  Genitourinary: Negative for difficulty urinating, dysuria, flank pain and frequency.  Musculoskeletal: Positive for gait problem. Negative for back pain, neck pain and neck stiffness.  Skin: Negative for rash and wound.  Neurological: Positive for dizziness and light-headedness. Negative for tremors, syncope, speech difficulty, weakness, numbness and headaches.  Psychiatric/Behavioral: Negative for agitation and confusion.  All other systems reviewed and are negative.    Physical Exam Updated Vital Signs BP 118/79   Pulse 83   Temp 98.1 F (36.7 C) (Oral)   Resp 20   SpO2 99%   Physical Exam  Constitutional: He is oriented to person, place, and time. He appears well-developed and well-nourished. No distress.  HENT:  Head: Normocephalic and atraumatic.  Nose: Nose normal.  Mouth/Throat: Oropharynx is clear and moist. No oropharyngeal exudate.  Eyes: Conjunctivae and EOM are normal. Pupils are equal, round, and reactive to light.  Neck: Normal range of motion. Neck supple.  Cardiovascular: Normal heart sounds and intact distal pulses.  An irregularly irregular rhythm present. Tachycardia present.   No murmur heard. Pulmonary/Chest: Effort normal and breath sounds normal. No stridor. No respiratory distress. He has no wheezes. He exhibits no tenderness.  Abdominal: Soft. There is no tenderness.  Musculoskeletal: He exhibits no edema.  Neurological: He is alert and oriented to person, place, and time. He is not disoriented. He displays normal reflexes. No cranial nerve deficit or sensory deficit. He exhibits normal muscle tone. Coordination and gait abnormal. GCS eye subscore is 4. GCS verbal subscore is 5. GCS motor subscore is 6.  Unsteadiness on feet with gait abnormality.   Skin: Skin is warm and dry. Capillary refill takes less than 2 seconds. He is not diaphoretic. No erythema. No pallor.  Psychiatric: He has a normal mood and affect.  Nursing note and vitals  reviewed.    ED Treatments / Results  Labs (all labs ordered are listed, but only abnormal results are displayed) Labs Reviewed  BASIC METABOLIC PANEL - Abnormal; Notable for the following:       Result Value   Potassium 3.3 (*)    Glucose, Bld 142 (*)    All other components within normal limits  BRAIN NATRIURETIC PEPTIDE - Abnormal; Notable for the following:    B Natriuretic Peptide 146.2 (*)    All other components within normal limits  CBC  URINALYSIS, ROUTINE W REFLEX MICROSCOPIC  PROTIME-INR  APTT  DIFFERENTIAL  RAPID URINE DRUG SCREEN, HOSP PERFORMED  HEPATIC FUNCTION PANEL  CBG MONITORING, ED  I-STAT TROPOININ, ED    EKG  EKG Interpretation  Date/Time:  Tuesday Apr 25 2017 16:00:05 EDT Ventricular Rate:  107 PR Interval:    QRS Duration: 134 QT Interval:  400 QTC Calculation: 534 R Axis:   -66 Text Interpretation:  Atrial fibrillation with rapid ventricular response Right bundle branch block Left anterior fascicular block ** Bifascicular block ** Minimal voltage criteria for LVH, may be normal variant Anteroseptal infarct , age undetermined Abnormal ECG Afib with RVR. Similar morphology to prior.  NO STEMI Confirmed by Theda Belfast ([id number]) on 04/25/2017 9:38:09 PM       Radiology Ct Head Wo Contrast  Result Date: 04/25/2017 CLINICAL DATA:  Dizziness today. History of hypertension, atrial fibrillation, long-term anti coagulation. EXAM: CT HEAD WITHOUT CONTRAST TECHNIQUE: Contiguous axial images were obtained from the base of the skull through the vertex without intravenous contrast. COMPARISON:  None. FINDINGS: BRAIN: No intraparenchymal hemorrhage, mass effect nor midline shift. The ventricles and sulci are normal for age. Patchy to confluent supratentorial white matter hypodensities. No acute large vascular territory infarcts. No abnormal extra-axial fluid collections. Basal cisterns are patent. VASCULAR: Dense dolicoectatic intracranial vessels. Moderate  carotid siphon and severe vertebrobasilar calcific atherosclerosis. Two wide-necked outpouchings basilar tip measuring to 4 mm. SKULL: No skull fracture. Multiple calvarial presumed of vascular lakes. No significant scalp soft tissue swelling. SINUSES/ORBITS: The mastoid air-cells and included paranasal sinuses are well-aerated.The included ocular globes and orbital contents are non-suspicious. Status post bilateral ocular lens implants. OTHER: Multiple absent teeth. IMPRESSION: Dense dolichoectatic intracranial vessels compatible with hemoconcentration and chronic hypertension. Superimposed basilar tip infundibuli versus aneurysm. Severe vertebrobasilar calcific atherosclerosis. Recommend CT angiography for further characterization. Moderate to severe chronic small vessel ischemic disease. Electronically Signed   By: Awilda Metro M.D.   On: 04/25/2017 16:31    Procedures Procedures (including critical care time)  Medications Ordered in ED Medications  potassium chloride SA (K-DUR,KLOR-CON) CR tablet  40 mEq (not administered)  iopamidol (ISOVUE-370) 76 % injection 50 mL (50 mLs Intravenous Contrast Given 04/26/17 0315)     Initial Impression / Assessment and Plan / ED Course  I have reviewed the triage vital signs and the nursing notes.  Pertinent labs & imaging results that were available during my care of the patient were reviewed by me and considered in my medical decision making (see chart for details).     AMERICUS PERKEY is a 66 y.o. male with a past medical history significant for hypertension, hyperlipidemia, CAD status post CABG, CHF, and atrial fibrillation previously on Xarelto but currently off of anticoagulation due to recent left ocular bleed who presents with lightheadedness and dizziness. Patient reports that today while at home watching television, patient had onset of lightheadedness and dizziness. Patient said he tried to stand up and walk but was feeling coordination  problems. He said his gait feels different. He says that he felt lightheaded and had to sit down. He denies any associated symptoms such as chest pain, palpitations, nausea, vomiting, vision changes, fevers, chills, constipation, diarrhea, dysuria. Patient says that he was present on Xarelto for atrial fibrillation until 4 weeks ago when it was discontinued after discovery of a left ocular bleed. Patient says that 2 weeks ago, before meals mode PCP and found to be in atrial fibrillation again.  History and exam are seen above. On my exam, patient had a shuffling gait but did not fall to one side on exam. Patient in normal finger-nose-finger. Normal strength, and sensation in the face and extremities. Lungs are clear and abdomen nontender. Patient has decreased vision in the left eye which she reports is unchanged from prior. No diplopia.  Patient had CT imaging performed in triage that shows concern for severe atherosclerosis of the vertebrobasilar arterial system. CTA was recommended for further evaluation of possible aneurysm versus fundibuli.  Given patient's dizziness sensation with possible posterior circulation abnormality on CT, neurology was called. Neurology did not feel patient should be a code stroke due to his minimal symptoms however, they did recommend MRI of the brain and see MRA of the head and neck to further evaluate. Patient is at risk for stroke given his atrial fibrillation off of anticoagulation.  Initial EKG did show evidence of atrial fibrillation. Initial lab testing showed mild hypokalemia but otherwise showed no evidence of infection, anemia, or leukocytosis. BNP slightly elevated at 146. Troponin negative.  Patient will have MRI of the head and neck to further workup his symptoms. If symptoms are negative, feel patient is stable for discharge however, if stroke is found, neurology team would likely need to be reengaged.  Care transferred to Dr. Blinda Leatherwood while awaiting MRI  workup.    Final Clinical Impressions(s) / ED Diagnoses   Final diagnoses:  Gait abnormality   Clinical Impression: 1. Gait abnormality     Disposition: Care transferred to Dr. Blinda Leatherwood to await imaging results.     Aleka Twitty, Canary Brim, MD 04/26/17 [phone]

## 2017-04-26 ENCOUNTER — Emergency Department (HOSPITAL_COMMUNITY): Payer: Medicare Other

## 2017-04-26 ENCOUNTER — Other Ambulatory Visit (HOSPITAL_COMMUNITY): Payer: Medicare Other

## 2017-04-26 ENCOUNTER — Encounter (HOSPITAL_COMMUNITY): Payer: Self-pay | Admitting: *Deleted

## 2017-04-26 DIAGNOSIS — I110 Hypertensive heart disease with heart failure: Principal | ICD-10-CM

## 2017-04-26 DIAGNOSIS — G459 Transient cerebral ischemic attack, unspecified: Secondary | ICD-10-CM | POA: Diagnosis present

## 2017-04-26 DIAGNOSIS — I5022 Chronic systolic (congestive) heart failure: Secondary | ICD-10-CM | POA: Diagnosis not present

## 2017-04-26 DIAGNOSIS — R9431 Abnormal electrocardiogram [ECG] [EKG]: Secondary | ICD-10-CM | POA: Diagnosis not present

## 2017-04-26 DIAGNOSIS — R42 Dizziness and giddiness: Secondary | ICD-10-CM | POA: Diagnosis not present

## 2017-04-26 DIAGNOSIS — E876 Hypokalemia: Secondary | ICD-10-CM | POA: Diagnosis present

## 2017-04-26 DIAGNOSIS — I480 Paroxysmal atrial fibrillation: Secondary | ICD-10-CM | POA: Diagnosis not present

## 2017-04-26 DIAGNOSIS — G450 Vertebro-basilar artery syndrome: Secondary | ICD-10-CM | POA: Diagnosis not present

## 2017-04-26 HISTORY — DX: Abnormal electrocardiogram (ECG) (EKG): R94.31

## 2017-04-26 HISTORY — DX: Transient cerebral ischemic attack, unspecified: G45.9

## 2017-04-26 LAB — LIPID PANEL
CHOL/HDL RATIO: 4.8 ratio
CHOLESTEROL: 197 mg/dL (ref 0–200)
HDL: 41 mg/dL (ref >40)
LDL CALC: 109 mg/dL — AB (ref 0–99)
Triglycerides: 236 mg/dL — ABNORMAL HIGH (ref <150)
VLDL: 47 mg/dL — AB (ref 0–40)

## 2017-04-26 LAB — URINALYSIS, ROUTINE W REFLEX MICROSCOPIC
Bilirubin Urine: NEGATIVE
Glucose, UA: NEGATIVE mg/dL
HGB URINE DIPSTICK: NEGATIVE
Ketones, ur: NEGATIVE mg/dL
Leukocytes, UA: NEGATIVE
Nitrite: NEGATIVE
Protein, ur: NEGATIVE mg/dL
SPECIFIC GRAVITY, URINE: 1.019 (ref 1.005–1.030)
pH: 5 (ref 5.0–8.0)

## 2017-04-26 LAB — RAPID URINE DRUG SCREEN, HOSP PERFORMED
Amphetamines: NOT DETECTED
BENZODIAZEPINES: NOT DETECTED
Barbiturates: NOT DETECTED
Cocaine: NOT DETECTED
OPIATES: NOT DETECTED
Tetrahydrocannabinol: NOT DETECTED

## 2017-04-26 LAB — MAGNESIUM: MAGNESIUM: 2 mg/dL (ref 1.7–2.4)

## 2017-04-26 MED ORDER — IOPAMIDOL (ISOVUE-370) INJECTION 76%
50.0000 mL | Freq: Once | INTRAVENOUS | Status: AC | PRN
  Administered 2017-04-26: 50 mL via INTRAVENOUS

## 2017-04-26 MED ORDER — PRAVASTATIN SODIUM 40 MG PO TABS: 40.0000 mg | ORAL_TABLET | Freq: Every day | ORAL | Status: DC

## 2017-04-26 MED ORDER — ASPIRIN 81 MG PO CHEW
81.0000 mg | CHEWABLE_TABLET | Freq: Every day | ORAL | Status: DC
  Administered 2017-04-26 – 2017-04-27 (×2): 81 mg via ORAL
  Filled 2017-04-26 (×2): qty 1

## 2017-04-26 MED ORDER — POTASSIUM CHLORIDE CRYS ER 20 MEQ PO TBCR
40.0000 meq | EXTENDED_RELEASE_TABLET | Freq: Once | ORAL | Status: AC
  Administered 2017-04-26: 40 meq via ORAL
  Filled 2017-04-26: qty 2

## 2017-04-26 MED ORDER — ALLOPURINOL 100 MG PO TABS
100.0000 mg | ORAL_TABLET | Freq: Every day | ORAL | Status: DC
  Administered 2017-04-26 – 2017-04-27 (×2): 100 mg via ORAL
  Filled 2017-04-26 (×2): qty 1

## 2017-04-26 MED ORDER — CARVEDILOL 6.25 MG PO TABS
6.2500 mg | ORAL_TABLET | Freq: Two times a day (BID) | ORAL | Status: DC
  Administered 2017-04-26 – 2017-04-27 (×2): 6.25 mg via ORAL
  Filled 2017-04-26 (×2): qty 1

## 2017-04-26 MED ORDER — LOSARTAN POTASSIUM 50 MG PO TABS
25.0000 mg | ORAL_TABLET | Freq: Every day | ORAL | Status: DC
  Administered 2017-04-26 – 2017-04-27 (×2): 25 mg via ORAL
  Filled 2017-04-26 (×3): qty 1

## 2017-04-26 MED ORDER — ACETAMINOPHEN 325 MG PO TABS: 650.0000 mg | ORAL_TABLET | ORAL | Status: DC | PRN

## 2017-04-26 MED ORDER — POTASSIUM CHLORIDE CRYS ER 20 MEQ PO TBCR
40.0000 meq | EXTENDED_RELEASE_TABLET | Freq: Once | ORAL | Status: DC
  Filled 2017-04-26: qty 2

## 2017-04-26 MED ORDER — AMIODARONE HCL 200 MG PO TABS
200.0000 mg | ORAL_TABLET | Freq: Every day | ORAL | Status: DC
  Administered 2017-04-26 – 2017-04-27 (×2): 200 mg via ORAL
  Filled 2017-04-26 (×2): qty 1

## 2017-04-26 MED ORDER — APIXABAN 5 MG PO TABS
5.0000 mg | ORAL_TABLET | Freq: Two times a day (BID) | ORAL | Status: DC
  Administered 2017-04-26 – 2017-04-27 (×2): 5 mg via ORAL
  Filled 2017-04-26 (×2): qty 1

## 2017-04-26 MED ORDER — ACETAMINOPHEN 650 MG RE SUPP: 650.0000 mg | RECTAL | Status: DC | PRN

## 2017-04-26 MED ORDER — ATORVASTATIN CALCIUM 40 MG PO TABS
40.0000 mg | ORAL_TABLET | Freq: Every day | ORAL | Status: DC
  Administered 2017-04-26: 40 mg via ORAL
  Filled 2017-04-26: qty 1

## 2017-04-26 MED ORDER — ENOXAPARIN SODIUM 40 MG/0.4ML ~~LOC~~ SOLN
40.0000 mg | SUBCUTANEOUS | Status: DC
  Filled 2017-04-26: qty 0.4

## 2017-04-26 MED ORDER — STROKE: EARLY STAGES OF RECOVERY BOOK
Freq: Once | Status: AC
  Administered 2017-04-26: 12:00:00
  Filled 2017-04-26: qty 1

## 2017-04-26 MED ORDER — ACETAMINOPHEN 160 MG/5ML PO SOLN: 650.0000 mg | ORAL | Status: DC | PRN

## 2017-04-26 MED ORDER — POTASSIUM CHLORIDE CRYS ER 20 MEQ PO TBCR
40.0000 meq | EXTENDED_RELEASE_TABLET | Freq: Every day | ORAL | Status: DC
Start: 2017-04-26 — End: 2017-04-27
  Administered 2017-04-26 – 2017-04-27 (×2): 40 meq via ORAL
  Filled 2017-04-26: qty 2

## 2017-04-26 MED ORDER — FUROSEMIDE 80 MG PO TABS
80.0000 mg | ORAL_TABLET | Freq: Two times a day (BID) | ORAL | Status: DC
  Administered 2017-04-26 – 2017-04-27 (×2): 80 mg via ORAL
  Filled 2017-04-26 (×2): qty 1

## 2017-04-26 NOTE — Discharge Instructions (Addendum)

## 2017-04-26 NOTE — Consult Note (Signed)
   [facility] Inpatient Consult   04/26/2017  Charles Mckinney [birth year] [id number]   Patient listed as active with [facility].  Patient is a 66 year old with a HX of HF, Atrial Fib, admitted for observation currently with severe dizziness and to R/O CVA vs TIA.  Met with the patient at the bedside.  Patient consent to ongoing follow up with [facility].   Patient states he has recently gotten some of his sight back to be able to weigh himself because he was unable to see the scales for his HF management.   Active consent on file.  Patient endorses [facility].   Inpatient RNCM is not currently available.  Will follow up to update that the patient is active with [facility].  [facility] does not interfere with or replace any services needed for this member.  For questions, please contact:  Natividad Brood, RN BSN [facility] Liaison  [phone] business mobile phone Toll free office [phone]

## 2017-04-26 NOTE — ED Provider Notes (Addendum)
Patient signed out to me by Dr. Rush Landmark to follow-up on MRI. Patient was seen with dizziness that began earlier today. He had evidence of vertebrobasilar disease on CT scan, possible aneurysm. Recommendation from neurology was to perform MRA and MRI. Patient was sent to MRI suite, was unable to perform the procedure. Patient refused to go back even with sedation. Study was therefore changed to CTA head and neck. 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  This was discussed with Dr. Otelia Limes, on call for neurology. He has reviewed the images. 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 Recommends admission for physical therapy and possibly MRI under sedation. Will need to consider restarting anticoagulation in this patient who has atrial fibrillation.   Gilda Crease, MD 04/26/17 0540    Gilda Crease, MD 04/26/17 [phone]

## 2017-04-26 NOTE — Evaluation (Signed)
Physical Therapy Evaluation Patient Details Name: Charles Mckinney MRN: [id number] DOB: 28-Jul-1951 Today's Date: 04/26/2017   History of Present Illness  Pt is a 66 y/o male who presents with episode of lightheadedness and dizziness. Questionable CVA however unable to have MRI per MD note. PMH significant for SOB, OA, obesity, hypertensive heart disease, CHF.  Clinical Impression  Pt admitted with above diagnosis. Pt currently with functional limitations due to the deficits listed below (see PT Problem List). At the time of PT eval pt was able to perform transfers and ambulation with gross supervision to mod I for safety. Continues to report mild dizziness with mobility and orthostatic blood pressure series was completed this session with no significant trend. Diastolic BP >100 at times and mobility was limited until guidelines were received from MD. Pt overall moving well this afternoon and reports frustration with safety measures including bed/chair alarm. Discussed why pt is at risk for falls and he reports understanding. Pt will benefit from skilled PT to increase their independence and safety with mobility to allow discharge to the venue listed below.     Orthostatic BPs Supine 142/78  Sitting 160/101  Standing 166/90  Sitting after walk to chair 142/107      Follow Up Recommendations Home health PT;Supervision for mobility/OOB    Equipment Recommendations  Other (comment) (Bariatric tub bench - will defer to OT if their recommendation differs)    Recommendations for Other Services       Precautions / Restrictions Precautions Precautions: Fall Restrictions Weight Bearing Restrictions: No      Mobility  Bed Mobility Overal bed mobility: Modified Independent             General bed mobility comments: HOB elevated and increased time. No assist required.   Transfers Overall transfer level: Modified independent Equipment used: None Transfers: Sit to/from Stand           General transfer comment: No unsteadiness noted, no assist required. Pt powered-up to full stand without difficulty.   Ambulation/Gait Ambulation/Gait assistance: Supervision Ambulation Distance (Feet): 15 Feet Assistive device: None Gait Pattern/deviations: Step-through pattern;Decreased stride length;Trunk flexed Gait velocity: Decreased Gait velocity interpretation: Below normal speed for age/gender General Gait Details: Ambulation distance limited by therapist due to BP changes (see orthostatics info for details). Did not note permissive hypertension in MD note at this time so activity limited and RN notified of BP status.  Stairs            Wheelchair Mobility    Modified Rankin (Stroke Patients Only)       Balance Overall balance assessment: No apparent balance deficits (not formally assessed)                                           Pertinent Vitals/Pain Pain Assessment: No/denies pain    Home Living Family/patient expects to be discharged to:: Private residence Living Arrangements: Alone Available Help at Discharge: Family;Available PRN/intermittently Type of Home: House Home Access: Ramped entrance     Home Layout: One level Home Equipment: Cane - single point;Walker - 2 wheels      Prior Function Level of Independence: Independent         Comments: Managing without AD or assist, however pt was sponge bathing as he was not able to enter his tub/shower due to stepping over high side.      Hand Dominance  Extremity/Trunk Assessment   Upper Extremity Assessment Upper Extremity Assessment: Defer to OT evaluation    Lower Extremity Assessment Lower Extremity Assessment: Generalized weakness    Cervical / Trunk Assessment Cervical / Trunk Assessment: Other exceptions Cervical / Trunk Exceptions: Forward head/rounded shoulders  Communication   Communication: No difficulties  Cognition Arousal/Alertness:  Awake/alert Behavior During Therapy: WFL for tasks assessed/performed Overall Cognitive Status: Within Functional Limits for tasks assessed                                        General Comments      Exercises     Assessment/Plan    PT Assessment Patient needs continued PT services  PT Problem List Decreased strength;Decreased range of motion;Decreased activity tolerance;Decreased balance;Decreased mobility;Decreased knowledge of use of DME;Decreased safety awareness;Decreased knowledge of precautions;Obesity       PT Treatment Interventions DME instruction;Gait training;Stair training;Functional mobility training;Therapeutic activities;Therapeutic exercise;Neuromuscular re-education;Patient/family education    PT Goals (Current goals can be found in the Care Plan section)  Acute Rehab PT Goals Patient Stated Goal: Return home today PT Goal Formulation: With patient Time For Goal Achievement: 05/03/17 Potential to Achieve Goals: Good    Frequency Min 3X/week   Barriers to discharge Decreased caregiver support Lives alone    Co-evaluation               AM-PAC PT "6 Clicks" Daily Activity  Outcome Measure Difficulty turning over in bed (including adjusting bedclothes, sheets and blankets)?: A Little Difficulty moving from lying on back to sitting on the side of the bed? : A Little Difficulty sitting down on and standing up from a chair with arms (e.g., wheelchair, bedside commode, etc,.)?: A Little Help needed moving to and from a bed to chair (including a wheelchair)?: A Little Help needed walking in hospital room?: A Little Help needed climbing 3-5 steps with a railing? : A Lot 6 Click Score: 17    End of Session Equipment Utilized During Treatment: Gait belt Activity Tolerance: Patient tolerated treatment well Patient left: in chair;with call bell/phone within reach;with chair alarm set Nurse Communication: Mobility status PT Visit Diagnosis:  Muscle weakness (generalized) (M62.81);Difficulty in walking, not elsewhere classified (R26.2)    Time: 1400-1430 PT Time Calculation (min) (ACUTE ONLY): 30 min   Charges:   PT Evaluation $PT Eval Moderate Complexity: 1 Procedure PT Treatments $Gait Training: 8-22 mins   PT G Codes:   PT G-Codes **NOT FOR INPATIENT CLASS** Functional Assessment Tool Used: Clinical judgement Functional Limitation: Mobility: Walking and moving around Mobility: Walking and Moving Around Current Status (Z6109): At least 40 percent but less than 60 percent impaired, limited or restricted Mobility: Walking and Moving Around Goal Status [phone]): At least 20 percent but less than 40 percent impaired, limited or restricted    Conni Slipper, PT, DPT Acute Rehabilitation Services Pager: [phone]   Marylynn Pearson 04/26/2017, 3:01 PM

## 2017-04-26 NOTE — Progress Notes (Signed)
Patient admitted after midnight for dizziness.  ? CVA but unable to have MRI.  Orthostatics pending.  Dizziness worsened when sitting up in bed.  Has been off NOAC for eye bleeding but spoke with Dr. [name] and can resume eliquis- patient prefers not to go back on xarelto or coumadin.  Marlin Canary DO

## 2017-04-26 NOTE — Progress Notes (Signed)
STROKE TEAM PROGRESS NOTE   HISTORY OF PRESENT ILLNESS (per record) Charles Mckinney is an 66 y.o. male who presented to the ED with c/c of lightheadedness and dizziness. Mostly consisting of a lightheaded sensation, the dizziness also has a vertiginous component. Of note, onset of symptoms at 1500 on Tuesday was associated with diarrhea. He was in a-fib with RVR in triage. He was taken off Xarelto 2 weeks ago due to left intraocular hemorrhage. In the ED, the patient went from laying to sitting without difficulty but upon standing up became dizzy and lightheaded.  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   SUBJECTIVE (INTERVAL HISTORY) NO family is at the bedside.  He was taken off Xarelto by cardiologist Dr. [name] due to left intraocular hemorrhage. However, his ophthalmologist seemed not concerned about Xarelto. He can not afford eliquis, and will continue Xarelto. However, his symptoms this time more consistent with posterior hypoperfusion due to severe atherosclerosis of posterior circulation.    OBJECTIVE Temp:  [97.6 F (36.4 C)-98.7 F (37.1 C)] 98 F (36.7 C) (05/24 0937) Pulse Rate:  [58-94] 78 (05/24 0937) Cardiac Rhythm: Atrial fibrillation (05/24 0815) Resp:  [17-20] 17 (05/24 0937) BP: (117-147)/(43-89) 128/75 (05/24 0937) SpO2:  [95 %-98 %] 97 % (05/24 0937)  CBC:   Recent Labs Lab 04/25/17 1607 04/25/17 2227  WBC 8.0  --   NEUTROABS  --  4.7  HGB 14.9  --   HCT 44.9  --   MCV 89.6  --   PLT 213  --     Basic Metabolic Panel:   Recent Labs Lab 04/25/17 1607 04/26/17 1209  NA 141  --   K 3.3*  --   CL 103  --   CO2 28  --    GLUCOSE 142*  --   BUN 19  --   CREATININE 1.20  --   CALCIUM 8.9  --   MG  --  2.0    Lipid Panel:     Component Value Date/Time   CHOL 197 04/26/2017 1209   TRIG 236 (H) 04/26/2017 1209   TRIG 243 (HH) 09/12/2006 1309   HDL 41 04/26/2017 1209   CHOLHDL 4.8 04/26/2017 1209   VLDL 47 (H) 04/26/2017 1209   LDLCALC 109 (H) 04/26/2017 1209   HgbA1c:  Lab Results  Component Value Date   HGBA1C 6.1 (H) 04/26/2017   Urine Drug Screen:     Component Value Date/Time   LABOPIA NONE DETECTED 04/25/2017 2221   COCAINSCRNUR NONE DETECTED 04/25/2017 2221   LABBENZ NONE DETECTED 04/25/2017 2221   AMPHETMU NONE DETECTED 04/25/2017 2221   THCU NONE DETECTED 04/25/2017 2221   LABBARB NONE DETECTED 04/25/2017 2221    Alcohol Level No results found for: ETH  IMAGING I have personally reviewed the radiological images below and agree with the radiology interpretations.  Ct Angio Head W Or Wo Contrast Ct Angio Neck W And/or Wo Contrast 04/26/2017 CTA NECK    1. Negative CTA of the neck. No hemodynamically significant or critical stenosis.  2. Atheromatous plaque about the left carotid bifurcation without significant stenosis. No significant atheromatous disease within the right carotid artery system.  3. Widely patent vertebral arteries within the neck.  CTA HEAD  1. Negative CTA for large or proximal arterial branch occlusion.  2. 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  3. Previously questioned abnormality at the basilar tip is a normal vascular infundibulum. No aneurysm identified.  4. Moderate atheromatous irregularity throughout the anterior circulation without flow-limiting or correctable  stenosis.   Ct Head Wo Contrast 04/25/2017 Dense dolichoectatic intracranial vessels compatible with hemoconcentration and chronic hypertension. Superimposed basilar tip infundibuli versus aneurysm. Severe vertebrobasilar calcific atherosclerosis. Recommend CT angiography for further characterization. Moderate to severe chronic small vessel ischemic disease.   TTE - Left ventricle: The cavity size was mildly dilated. Wall   thickness was increased in a pattern of mild LVH. Systolic   function was mildly to moderately reduced. The estimated ejection   fraction was in the range of 40% to 45%. Diffuse hypokinesis. - Aortic root: The aortic root was mildly dilated. - Mitral valve: There was mild regurgitation. - Left atrium: The atrium was moderately dilated. - Pulmonary arteries: Systolic pressure was mildly increased. PA   peak pressure: 38 mm Hg (S). Impressions: - Technically difficult; definity used; mild to moderate global   reduction in LV systolic function; mild LVH; mildly dilated   aortic root; mild MR; moderate LAE; trace TR with mildly elevated   pulmonary pressure.   PHYSICAL EXAM  Temp:  [97.6 F (36.4 C)-98.3 F (36.8 C)] 98.3 F (36.8 C) (05/24 1440) Pulse Rate:  [76-94] 76 (05/24 1440) Resp:  [16-18] 16 (05/24 1440) BP: (122-138)/(43-76) 133/76 (05/24 1440) SpO2:  [96 %-97 %] 96 % (05/24 1440)  General - Well nourished, well developed, in no apparent distress.  Ophthalmologic - Fundi not visualized due to eye movement.  Cardiovascular - Regular rate and rhythm.  Mental Status -  Level of arousal and orientation to time, place, and person were intact. Language including expression, naming, repetition, comprehension was assessed and found intact. Fund of Knowledge was assessed and was intact.  Cranial Nerves II - XII - II - Visual field intact OD, only see HW at OS. III, IV, VI - Extraocular movements intact. V - Facial sensation intact bilaterally. VII -  Facial movement intact bilaterally. VIII - Hearing & vestibular intact bilaterally. X - Palate elevates symmetrically. XI - Chin turning & shoulder shrug intact bilaterally. XII - Tongue protrusion intact  Motor Strength - The patient's strength was normal in all extremities and pronator drift was absent.  Bulk was normal and fasciculations were absent.   Motor Tone - Muscle tone was assessed at the neck and appendages and was normal.  Reflexes - The patient's reflexes were 1+ in all extremities and he had no pathological reflexes.  Sensory - Light touch, temperature/pinprick were assessed and were symmetrical.    Coordination - The patient had normal movements in the hands with no ataxia or dysmetria.  Tremor was absent.  Gait and Station - deferred   ASSESSMENT/PLAN Charles Mckinney is a 66 y.o. male with history of obesity, renal calculi, hypertension, hyperlipidemia, atrial fibrillation (xarelto DC'd 2/2 intraocular hemorrhage), congestive heart failure, and anxiety presenting with lightheadedness and dizziness with vertiginous component.  He did not receive IV t-PA due to late presentation.  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  2D Echo EF 40-45% - improved from 30-35% in 12/2016  LDL - 109  HgbA1c - 6.1  UDS negative  VTE prophylaxis - Eliquis Diet Heart Room service appropriate? Yes; Fluid consistency: Thin  aspirin 81 mg daily prior to admission, now on Xarelto. Continue Xarelto on discharge as pt can not afford eliquis.   Patient counseled to be compliant with his antithrombotic medications  Ongoing aggressive stroke risk factor management  Therapy recommendations: - Home health physical therapy recommended. OT evaluation  pending.  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 However, due to recent intraocular hemorrhage, will hold off any antiplatelet at this time.  Afib s/p cardioversion   Was on Xarelto, but taken off due to intraocular hemorrhage  Dr. Benjamine Mola has discussed with pt cardiologist Dr. Marcy Siren and he agrees with continued anticoagulatoin  Switched to eliquis but pt not able to afford  Will switch back to Xarelto.  Hypertension  Blood pressure tends to run high  Permissive hypertension (OK if < 180/105) but gradually normalize in 5-7 days  Long-term BP goal normotensive  Hyperlipidemia  Home meds:  Pravachol 40 mg daily resumed in hospital  LDL 109, goal < 70  Change to Lipitor 40 mg daily  Continue statin at discharge  Other Stroke Risk Factors  Advanced age  Former cigarette smoker. He quit 18 years ago.  Family hx stroke (brother)  Other Active Problems    Hospital day # 0  Neurology will sign off. Please call with questions. No neuro follow up needed at this time. Thanks for the consult.  Marvel Plan, MD PhD Stroke Neurology 04/27/2017 10:40 PM   To contact Stroke Continuity provider, please refer to [email or web address]. After hours, contact General Neurology

## 2017-04-26 NOTE — Consult Note (Signed)
NEURO HOSPITALIST CONSULT NOTE   Requestig physician: Dr. Benjamine Mola  Reason for Consult: Dizziness  History obtained from:   Patient and Chart     HPI:                                                                                                                                          Charles Mckinney is an 66 y.o. male who presented to the ED with c/c of lightheadedness and dizziness. Mostly consisting of a lightheaded sensation, the dizziness also has a vertiginous component. Of note, onset of symptoms at 1500 on Tuesday was associated with diarrhea. He was in a-fib with RVR in triage. He was taken off Xarelto 2 weeks ago due to left intraocular hemorrhage. In the ED, the patient went from laying to sitting without difficulty but upon standing up became dizzy and lightheaded.  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  Past Medical History:  Diagnosis Date  . Anxiety   . CHF (congestive heart failure) (HCC)   . Dysrhythmia    ATRIAL FIBRILATION, resolved with cardioversion  . GERD (gastroesophageal reflux disease)   . Hyperlipidemia   . Hypertension   . Hypertensive heart disease   . Kidney stones   . Obesity (BMI 30-39.9)   . Osteoarthritis   . PONV (postoperative nausea and vomiting)   . Shortness of breath dyspnea     Past Surgical History:  Procedure Laterality Date  . CARDIAC CATHETERIZATION N/A 04/09/2015   Procedure: Right/Left Heart Cath and Coronary Angiography;  Surgeon: Orpah Cobb, MD;  Location: [facility] INVASIVE CV LAB CUPID;  Service: Cardiovascular;  Laterality: N/A;  . CARDIOVERSION N/A 01/19/2015   Procedure: CARDIOVERSION;   Surgeon: Othella Boyer, MD;  Location: [facility] ENDOSCOPY;  Service: Cardiovascular;  Laterality: N/A;  pt in Afib, 12:22 synched cardioversion @  120 joules using Propofol 100 mg,IV....unsuccessful, repeated at 200 joules  . CARDIOVERSION N/A 04/02/2015   Procedure: CARDIOVERSION;  Surgeon: Orpah Cobb, MD;  Location: [facility] ENDOSCOPY;  Service: Cardiovascular;  Laterality: N/A;  . CATARACT EXTRACTION    . CLIPPING OF ATRIAL APPENDAGE  04/13/2015   Procedure: CLIPPING OF ATRIAL APPENDAGE;  Surgeon: Alleen Borne, MD;  Location: [facility] OR;  Service: Open Heart Surgery;;  . CORONARY ARTERY BYPASS GRAFT N/A 04/13/2015   Procedure: CORONARY ARTERY BYPASS GRAFTING (CABG);  Surgeon: Alleen Borne, MD;  Location: [facility] OR;  Service: Open Heart Surgery;  Laterality: N/A;  . TEE WITHOUT CARDIOVERSION N/A 04/13/2015   Procedure: TRANSESOPHAGEAL ECHOCARDIOGRAM (TEE);  Surgeon: Alleen Borne, MD;  Location: [facility] OR;  Service: Open Heart Surgery;  Laterality: N/A;    Family History  Problem Relation Age of Onset  . Diabetes Mother   . Heart disease Father   . Congestive Heart Failure Sister   . Stroke Brother   . Heart disease Brother   . Diabetes Brother    Social History:  reports that he quit smoking about 18 years ago. He has never used smokeless tobacco. He reports that he does not drink alcohol or use drugs.  Allergies  Allergen Reactions  . Niacin And Related Other (See Comments)    FLUSHING  . Penicillins Itching and Rash    Has patient had a PCN reaction causing immediate rash, facial/tongue/throat swelling, SOB or lightheadedness with hypotension:YES Has patient had a PCN reaction causing severe rash involving mucus membranes or skin necrosis: NO Has patient had a PCN reaction that required hospitalization NO Has patient had a PCN reaction occurring within the last 10 years: NO If all of the above answers are "NO", then may proceed with Cephalosporin use.    HOME MEDICATIONS:                                                                                                                      acetaminophen (TYLENOL) 500 MG tablet Take 1,000 mg by mouth every 6 (six) hours as needed for moderate pain. Reported on 03/11/2016 Alberteen Sam, MD Not Ordered  allopurinol (ZYLOPRIM) 100 MG tablet TAKE 1 TABLET(100 MG) BY MOUTH DAILY Danford, Earl Lites, MD Reordered  Orderedas:allopurinol (ZYLOPRIM) tablet 100 mg - 100 mg, Oral, Daily, First dose on Wed 04/26/17 at 1000  amiodarone (PACERONE) 200 MG tablet TAKE 1 TABLET(200 MG) BY MOUTH DAILY Danford, Earl Lites, MD Reordered  Orderedas:amiodarone (PACERONE) tablet 200 mg - 200 mg, Oral, Daily, First dose on Wed 04/26/17 at 1000  aspirin 81 MG EC tablet Take 1 tablet (81 mg total) by mouth daily. Swallow whole. Danford, Earl Lites, MD Reordered   Patient taking differently: Take 81 mg by mouth daily.     Orderedas:aspirin EC tablet 81 mg - 81 mg, Oral, Daily, First dose on Wed 04/26/17 at 1000  carvedilol (COREG) 6.25 MG tablet TAKE 1 TABLET(6.25 MG) BY MOUTH TWICE DAILY WITH A MEAL Danford, Earl Lites, MD Reordered  Orderedas:carvedilol (COREG) tablet 6.25 mg - 6.25 mg, Oral, 2 times daily with meals, First dose on Wed 04/26/17 at 0800  Cholecalciferol (VITAMIN D PO) Take 1 tablet by mouth daily. Danford, Earl Lites, MD Not Ordered  fluticasone (FLONASE) 50 MCG/ACT nasal spray Place 1 spray into both nostrils daily. Danford, Earl Lites, MD Not Ordered  furosemide (LASIX) 80 MG tablet Take 80 mg by mouth 2 (two) times daily. Danford, Earl Lites, MD Reordered  Orderedas:furosemide (LASIX) tablet 80 mg - 80 mg, Oral, 2 times daily, First dose on Wed 04/26/17 at 0800  losartan (COZAAR) 25 MG tablet Take 1 tablet (25 mg total) by mouth daily.  Alberteen Sam, MD Reordered  Orderedas:losartan (COZAAR) tablet 25 mg - 25 mg, Oral, Daily, First dose on Wed 04/26/17 at 1000  potassium chloride SA (K-DUR,KLOR-CON) 20 MEQ  tablet Take 2 tablets (40 mEq total) by mouth daily. Danford, Earl Lites, MD Reordered  Orderedas:potassium chloride SA (K-DUR,KLOR-CON) CR tablet 40 mEq - 40 mEq, Oral, Daily, First dose on Wed 04/26/17 at 1000  pravastatin (PRAVACHOL) 40 MG tablet TAKE 1 TABLET(40 MG) BY MOUTH DAILY Danford, Earl Lites, MD Reordered  Orderedas:pravastatin (PRAVACHOL) tablet 40 mg - 40 mg, Oral, Daily-1800, First dose on Wed 04/26/17 at 1800  PROAIR HFA 108 (90 Base) MCG/ACT inhaler INHALE 1 PUFF INTO THE LUNGS EVERY 6 HOURS AS NEEDED FOR WHEEZING OR SHORTNESS OF BREATH Danford, Earl Lites, MD Not Ordered  clotrimazole (LOTRIMIN) 1 % cream Apply 1 application topically 2 (two) times daily. Alberteen Sam, MD Discontinued   Patient not taking: Reported on 04/25/2017    furosemide (LASIX) 40 MG tablet Take 2 tablets (80 mg total) by mouth daily. Take 80 mg in AM and 40 in late afternoon Danford, Earl Lites, MD Discontinued   Patient not taking: Reported on 04/25/2017    ondansetron (ZOFRAN ODT) 4 MG disintegrating tablet 4mg  ODT q4 hours prn nausea/vomit Danford, Earl Lites, MD Discontinued   Patient not taking: Reported on 04/25/2017       ROS:                                                                                                                                       Positive for left monocular vision loss secondary to recent hemorrhage. No focal weakness or sensory numbness. Other ROS as per HPI.    Blood pressure (!) 144/92, pulse 72, temperature 98 F (36.7 C), resp. rate 17, SpO2 90 %.  General Examination:                                                                                                      General: Morbidly obese HEENT-  [place]/AT   Lungs- Respirations unlabored Extremities- No cyanosis or pallor.   Neurological Examination Mental Status: Alert, oriented, thought content appropriate.  Speech fluent without evidence of aphasia.  Able to follow all commands  without difficulty. Cranial Nerves: II: Decreased visual acuity OS. Visual fields normal OD. PERRL.   III,IV, VI: ptosis not present, EOMI without nystagmus V,VII: smile symmetric, facial temp sensation normal bilaterally VIII: hearing intact to conversation IX,X: no hoarseness or hypophonia XI:  symmetric XII: midline tongue extension Motor: Right : Upper extremity   5/5    Left:     Upper extremity   5/5  Lower extremity   5/5     Lower extremity   5/5 Normal tone throughout; no atrophy noted Sensory: Temp and light touch intact x 4. No extinction. Deep Tendon Reflexes: Normoactive x 4.  Plantars: Right: downgoing   Left: downgoing Cerebellar: No ataxia with FNF bilaterally.  Gait: Deferred  Lab Results: Basic Metabolic Panel:  Recent Labs Lab 04/25/17 1607  NA 141  K 3.3*  CL 103  CO2 28  GLUCOSE 142*  BUN 19  CREATININE 1.20  CALCIUM 8.9    Liver Function Tests:  Recent Labs Lab 04/25/17 2227  AST 22  ALT 19  ALKPHOS 60  BILITOT 0.9  PROT 6.5  ALBUMIN 3.5   No results for input(s): LIPASE, AMYLASE in the last 168 hours. No results for input(s): AMMONIA in the last 168 hours.  CBC:  Recent Labs Lab 04/25/17 1607 04/25/17 2227  WBC 8.0  --   NEUTROABS  --  4.7  HGB 14.9  --   HCT 44.9  --   MCV 89.6  --   PLT 213  --     Cardiac Enzymes: No results for input(s): CKTOTAL, CKMB, CKMBINDEX, TROPONINI in the last 168 hours.  Lipid Panel: No results for input(s): CHOL, TRIG, HDL, CHOLHDL, VLDL, LDLCALC in the last 168 hours.  CBG: No results for input(s): GLUCAP in the last 168 hours.  Microbiology: Results for orders placed or performed during the hospital encounter of 04/05/15  MRSA PCR Screening     Status: None   Collection Time: 04/05/15  4:26 PM  Result Value Ref Range Status   MRSA by PCR NEGATIVE NEGATIVE Final    Comment:        The GeneXpert MRSA Assay (FDA approved for NASAL specimens only), is one component of a comprehensive  MRSA colonization surveillance program. It is not intended to diagnose MRSA infection nor to guide or monitor treatment for MRSA infections.     Coagulation Studies:  Recent Labs  04/25/17 2227  LABPROT 14.1  INR 1.09    Imaging: Ct Angio Head W Or Wo Contrast  Result Date: 04/26/2017 CLINICAL DATA:  Initial evaluation for acute dizziness. EXAM: CT ANGIOGRAPHY HEAD AND NECK TECHNIQUE: Multidetector CT imaging of the head and neck was performed using the standard protocol during bolus administration of intravenous contrast. Multiplanar CT image reconstructions and MIPs were obtained to evaluate the vascular anatomy. Carotid stenosis measurements (when applicable) are obtained utilizing NASCET criteria, using the distal internal carotid diameter as the denominator. CONTRAST:  50 cc of Isovue 370. COMPARISON:  Prior CT from 04/25/2017. FINDINGS: CTA NECK FINDINGS Aortic arch: Visualized aortic arch of normal caliber with normal branch pattern. No high-grade stenosis about the origin of the great vessels. Sequelae of prior CABG noted. Visualized subclavian arteries widely patent. Right carotid system: Right common and internal carotid artery is widely patent without flow-limiting stenosis or other acute vascular abnormality. No significant atheromatous plaque about the right carotid bifurcation. Left carotid system: Left common carotid artery patent from its origin to the bifurcation without stenosis. Eccentric atheromatous plaque about the left bifurcation without hemodynamically significant stenosis. Left ICA widely patent distally to the skullbase without flow-limiting stenosis. Vertebral arteries: Both of the vertebral arteries arise from the subclavian arteries. Left vertebral artery dominant. Minimal plaque at the origin left vertebral artery without significant  stenosis. Left vertebral artery is patent distally without stenosis or other acute vascular abnormality. The the Skeleton: No acute  osseous abnormality. No worrisome lytic or blastic osseous lesions. Median sternotomy noted. Mild-to-moderate degenerative spondylolysis within the cervical spine, greatest at C5-6 and C6-7. Other neck: No acute soft tissue abnormality within the neck. No adenopathy. Thyroid normal. Upper chest: Visualized upper chest within normal limits. Visualized lungs are clear. Few subcentimeter partially calcified granulomas noted. Review of the MIP images confirms the above findings CTA HEAD FINDINGS Anterior circulation: Petrous segments widely patent bilaterally. Scattered calcified atheromatous plaque within the cavernous/ supraclinoid ICAs without flow-limiting stenosis. ICA termini widely patent right A1 segment dominant and widely patent. Left A1 segment hypoplastic. Short-segment mild stenosis at the proximal left A1 segment noted. Anterior communicating artery normal. Anterior cerebral arteries patent to their distal aspects. M1 segments demonstrate mild atheromatous irregularity without high-grade flow-limiting stenosis. MCA bifurcations normal. No proximal M2 occlusion. Distal MCA branches well opacified and symmetric. Distal small vessel atheromatous irregularity seen within the MCA branches bilaterally. Posterior circulation: Left vertebral artery dominant. Multifocal atheromatous plaque throughout the left V4 segment with moderate to severe stenosis (up to 80%). Right vertebral artery hypoplastic and divides at the takeoff of the right posterior communicating artery. Scattered atheromatous irregularity within the right V4 segment without flow-limiting stenosis. Right PICA patent. Extensive atheromatous irregularity throughout the basilar artery which is diffusely irregular. Probable superimposed penetrating plaque at the posterior aspect of the distal basilar artery (series 7, image 120). There is associated narrowing of the basilar artery of up to approximately 50- 75%, greatest proximally at the  vertebrobasilar junction. Basilar tip mildly ectatic and widely patent. Superior cerebral arteries patent bilaterally. Left SCA arises from the left P1 segment. Both of the posterior cerebral artery supplied primarily via the basilar. Normal takeoff of the left P1 segment accounts for previously questioned abnormality on prior CT. PCAs are patent to their distal aspects. Distal small vessel atheromatous irregularity noted within the PCAs bilaterally. Venous sinuses: Patent. Right transverse and sigmoid sinuses are hypoplastic. Anatomic variants: No significant anatomic variant. No aneurysm or vascular malformation. Delayed phase: No pathologic enhancement. Review of the MIP images confirms the above findings IMPRESSION: CTA NECK IMPRESSION: 1. Negative CTA of the neck. No hemodynamically significant or critical stenosis. 2. Atheromatous plaque about the left carotid bifurcation without significant stenosis. No significant atheromatous disease within the right carotid artery system. 3. Widely patent vertebral arteries within the neck. CTA HEAD IMPRESSION: 1. Negative CTA for large or proximal arterial branch occlusion. 2. 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 3. Previously questioned abnormality at the basilar tip is a normal vascular infundibulum. No aneurysm identified. 4. Moderate atheromatous irregularity throughout the anterior circulation without flow-limiting or correctable stenosis. Electronically Signed   By: Rise Mu M.D.   On: 04/26/2017 04:37   Ct Head Wo Contrast  Result Date: 04/25/2017 CLINICAL DATA:  Dizziness today. History of hypertension, atrial fibrillation, long-term anti coagulation. EXAM:  CT HEAD WITHOUT CONTRAST TECHNIQUE: Contiguous axial images were obtained from the base of the skull through the vertex without intravenous contrast. COMPARISON:  None. FINDINGS: BRAIN: No intraparenchymal hemorrhage, mass effect nor midline shift. The ventricles and sulci are normal for age. Patchy to confluent supratentorial white matter hypodensities. No acute large  vascular territory infarcts. No abnormal extra-axial fluid collections. Basal cisterns are patent. VASCULAR: Dense dolicoectatic intracranial vessels. Moderate carotid siphon and severe vertebrobasilar calcific atherosclerosis. Two wide-necked outpouchings basilar tip measuring to 4 mm. SKULL: No skull fracture. Multiple calvarial presumed of vascular lakes. No significant scalp soft tissue swelling. SINUSES/ORBITS: The mastoid air-cells and included paranasal sinuses are well-aerated.The included ocular globes and orbital contents are non-suspicious. Status post bilateral ocular lens implants. OTHER: Multiple absent teeth. IMPRESSION: Dense dolichoectatic intracranial vessels compatible with hemoconcentration and chronic hypertension. Superimposed basilar tip infundibuli versus aneurysm. Severe vertebrobasilar calcific atherosclerosis. Recommend CT angiography for further characterization. Moderate to severe chronic small vessel ischemic disease. Electronically Signed   By: Awilda Metro M.D.   On: 04/25/2017 16:31   Ct Angio Neck W And/or Wo Contrast  Result Date: 04/26/2017 CLINICAL DATA:  Initial evaluation for acute dizziness. EXAM: CT ANGIOGRAPHY HEAD AND NECK TECHNIQUE: Multidetector CT imaging of the head and neck was performed using the standard protocol during bolus administration of intravenous contrast. Multiplanar CT image reconstructions and MIPs were obtained to evaluate the vascular anatomy. Carotid stenosis measurements (when applicable) are obtained utilizing NASCET criteria, using the distal internal carotid diameter as the  denominator. CONTRAST:  50 cc of Isovue 370. COMPARISON:  Prior CT from 04/25/2017. FINDINGS: CTA NECK FINDINGS Aortic arch: Visualized aortic arch of normal caliber with normal branch pattern. No high-grade stenosis about the origin of the great vessels. Sequelae of prior CABG noted. Visualized subclavian arteries widely patent. Right carotid system: Right common and internal carotid artery is widely patent without flow-limiting stenosis or other acute vascular abnormality. No significant atheromatous plaque about the right carotid bifurcation. Left carotid system: Left common carotid artery patent from its origin to the bifurcation without stenosis. Eccentric atheromatous plaque about the left bifurcation without hemodynamically significant stenosis. Left ICA widely patent distally to the skullbase without flow-limiting stenosis. Vertebral arteries: Both of the vertebral arteries arise from the subclavian arteries. Left vertebral artery dominant. Minimal plaque at the origin left vertebral artery without significant stenosis. Left vertebral artery is patent distally without stenosis or other acute vascular abnormality. The the Skeleton: No acute osseous abnormality. No worrisome lytic or blastic osseous lesions. Median sternotomy noted. Mild-to-moderate degenerative spondylolysis within the cervical spine, greatest at C5-6 and C6-7. Other neck: No acute soft tissue abnormality within the neck. No adenopathy. Thyroid normal. Upper chest: Visualized upper chest within normal limits. Visualized lungs are clear. Few subcentimeter partially calcified granulomas noted. Review of the MIP images confirms the above findings CTA HEAD FINDINGS Anterior circulation: Petrous segments widely patent bilaterally. Scattered calcified atheromatous plaque within the cavernous/ supraclinoid ICAs without flow-limiting stenosis. ICA termini widely patent right A1 segment dominant and widely patent. Left A1 segment hypoplastic.  Short-segment mild stenosis at the proximal left A1 segment noted. Anterior communicating artery normal. Anterior cerebral arteries patent to their distal aspects. M1 segments demonstrate mild atheromatous irregularity without high-grade flow-limiting stenosis. MCA bifurcations normal. No proximal M2 occlusion. Distal MCA branches well opacified and symmetric. Distal small vessel atheromatous irregularity seen within the MCA branches bilaterally. Posterior circulation: Left vertebral artery dominant. Multifocal atheromatous plaque throughout the left V4 segment with moderate to severe stenosis (up to 80%). Right vertebral artery hypoplastic and divides at the takeoff of the right posterior communicating artery. Scattered atheromatous irregularity within the right V4 segment without flow-limiting stenosis. Right PICA patent. Extensive atheromatous irregularity throughout the basilar artery which is diffusely irregular. Probable superimposed penetrating plaque at the posterior aspect of  the distal basilar artery (series 7, image 120). There is associated narrowing of the basilar artery of up to approximately 50- 75%, greatest proximally at the vertebrobasilar junction. Basilar tip mildly ectatic and widely patent. Superior cerebral arteries patent bilaterally. Left SCA arises from the left P1 segment. Both of the posterior cerebral artery supplied primarily via the basilar. Normal takeoff of the left P1 segment accounts for previously questioned abnormality on prior CT. PCAs are patent to their distal aspects. Distal small vessel atheromatous irregularity noted within the PCAs bilaterally. Venous sinuses: Patent. Right transverse and sigmoid sinuses are hypoplastic. Anatomic variants: No significant anatomic variant. No aneurysm or vascular malformation. Delayed phase: No pathologic enhancement. Review of the MIP images confirms the above findings IMPRESSION: CTA NECK IMPRESSION: 1. Negative CTA of the neck. No  hemodynamically significant or critical stenosis. 2. Atheromatous plaque about the left carotid bifurcation without significant stenosis. No significant atheromatous disease within the right carotid artery system. 3. Widely patent vertebral arteries within the neck. CTA HEAD IMPRESSION: 1. Negative CTA for large or proximal arterial branch occlusion. 2. 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 3. Previously questioned abnormality at the basilar tip is a normal vascular infundibulum. No aneurysm identified. 4. Moderate atheromatous irregularity throughout the anterior circulation without flow-limiting or correctable stenosis. Electronically Signed   By: Rise Mu M.D.   On: 04/26/2017 04:37    Assessment: 1. New onset of dizziness. Orthostatic component is likely given worsened symptoms when standing in conjunction with recent diarrhea. 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 Also on DDx is small brainstem stroke secondary to embolization from cardiac source given his a-fib and recent discontinuation of Xarelto.  2. Recent left intraocular hemorrhage while on Xarelto.    Recommendations: 1. Discussed risks/benefits of anticoagulation with the patient. I expressed my medical opinion that overall benefits of anticoagulation regarding short and long-term morbidity/mortality outweighs risk of recurrent intraocular hemorrhage. The patient states that he feels that the risk of stroke for him is of greater concern than hemorrhage.  2. He does not wish to be on Coumadin and is concerned about restarting  Xarelto. He is interested in considering dabigatran or Pradaxa.  3. Would discuss option of restarting anticoagulation with his ophthalmologist. If ophthalmology feels that anticoagulation is an unacceptable alternative, then the next best option would be stroke prevention with ASA alone, although again, in my opinion stroke prevention is of greater concern than reducing the risk of recurrent intraocular hemorrhage. 4. Obtain MRI brain under sedation to rule out small brainstem or cerebellar acute infarction. 5. TTE.   6. Continue pravachol.  7. Maintain hydration to avoid hypotensive episodes.   Electronically signed: Dr. [name] 04/26/2017, 7:47 AM

## 2017-04-26 NOTE — ED Notes (Signed)
Pt went from laying to sitting without difficulty but upon standing up becomes dizzy / lightheaded

## 2017-04-26 NOTE — Care Management Note (Addendum)
Case Management Note  Patient Details  Name: Charles Mckinney MRN: [id number] Date of Birth: 05-24-1951  Subjective/Objective:      Pt in with dizziness. He is from home.  Pt has [employer] as primary insurance. CM spoke to [employer] CSW at [facility] and informed them of his admission. Pts CSW is Maggie through the [employer].             Action/Plan: Awaiting PT/OT recommendations. CM following for d/c needs, physician orders.  Expected Discharge Date:                  Expected Discharge Plan:     In-House Referral:     Discharge planning Services     Post Acute Care Choice:    Choice offered to:     DME Arranged:    DME Agency:     HH Arranged:    HH Agency:     Status of Service:  In process, will continue to follow  If discussed at Long Length of Stay Meetings, dates discussed:    Additional Comments:  Kermit Balo, RN 04/26/2017, 11:03 AM

## 2017-04-26 NOTE — ED Notes (Signed)
Patient transported to MRI 

## 2017-04-26 NOTE — H&P (Signed)
History and Physical  Patient Name: Charles Mckinney     ZOX:[id number]    DOB: 04/13/1951    DOA: 04/25/2017 PCP: [name], MD   Patient coming from: Home     Chief Complaint: Dizziness  HPI: Charles Mckinney is a 66 y.o. male with a past medical history significant for CHF EF 30%, Afib off Xarelto, CAD s/p CABG in 2016, HTN, and MO who presents with dizziness for 1 day.  The patient was in his usual state of health until today at lunch, he had immediate profuse diarrhea. Then about one hour later he was sitting in his recliner when he felt sudden onset of "wooziness", like he was [place] "pass out".  Lightheadedness, not vertigo.  No palpiations, chest pain, dyspnea. He sat forward and shook his head which started to make it better, then he stood up and felt like he was "staggering and off balance", so he decided to come to the ER.  ED course: -Afebrile, heart rate 83, respiration pulse ox normal, blood pressure 118/79 -Na 141, K 3.3, Cr 1.2 (baseline 1.2), WBC 8K, Hgb 14.9 -Urinalysis clear -UDS negative -Coags normal -BNP minimally elevated -Troponin negative -Was in chronic A. 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  Neurological: Positive for dizziness. Negative for tingling, tremors, sensory change, speech change, focal weakness, seizures, loss of consciousness and headaches.  All other systems reviewed and are  negative.        Past Medical History:  Diagnosis Date  . Anxiety   . CHF (congestive heart failure) (HCC)   . Dysrhythmia    ATRIAL FIBRILATION, resolved with cardioversion  . GERD (gastroesophageal reflux disease)   . Hyperlipidemia   . Hypertension   . Hypertensive heart disease   . Kidney stones   . Obesity (BMI 30-39.9)   . Osteoarthritis   . PONV (postoperative nausea and vomiting)   . Shortness of breath dyspnea     Past Surgical History:  Procedure Laterality Date  . CARDIAC CATHETERIZATION N/A 04/09/2015   Procedure: Right/Left Heart Cath and Coronary Angiography;  Surgeon: Orpah Cobb, MD;  Location: [facility] INVASIVE CV LAB CUPID;  Service: Cardiovascular;  Laterality: N/A;  . CARDIOVERSION N/A 01/19/2015   Procedure: CARDIOVERSION;  Surgeon: Othella Boyer, MD;  Location: [facility] ENDOSCOPY;  Service: Cardiovascular;  Laterality: N/A;  pt in Afib, 12:22 synched cardioversion @  120 joules using Propofol 100 mg,IV....unsuccessful, repeated at 200 joules  . CARDIOVERSION N/A 04/02/2015   Procedure: CARDIOVERSION;  Surgeon: Orpah Cobb, MD;  Location: [facility] ENDOSCOPY;  Service: Cardiovascular;  Laterality: N/A;  . CATARACT EXTRACTION    . CLIPPING OF ATRIAL APPENDAGE  04/13/2015   Procedure: CLIPPING OF ATRIAL APPENDAGE;  Surgeon: Alleen Borne, MD;  Location: [facility] OR;  Service: Open Heart Surgery;;  . CORONARY ARTERY BYPASS GRAFT N/A 04/13/2015   Procedure: CORONARY ARTERY BYPASS GRAFTING (CABG);  Surgeon: Alleen Borne, MD;  Location: [facility] OR;  Service:  Open Heart Surgery;  Laterality: N/A;  . TEE WITHOUT CARDIOVERSION N/A 04/13/2015   Procedure: TRANSESOPHAGEAL ECHOCARDIOGRAM (TEE);  Surgeon: Alleen Borne, MD;  Location[facility] OR;  Service: Open Heart Surgery;  Laterality: N/A;    Social History: Patient lives alone.  Patient walks uanssisted.  He is from [place].  He is a former smoker.    Allergies  Allergen Reactions  . Niacin And Related Other (See Comments)    FLUSHING  .  Penicillins Itching and Rash    Has patient had a PCN reaction causing immediate rash, facial/tongue/throat swelling, SOB or lightheadedness with hypotension:YES Has patient had a PCN reaction causing severe rash involving mucus membranes or skin necrosis: NO Has patient had a PCN reaction that required hospitalization NO Has patient had a PCN reaction occurring within the last 10 years: NO If all of the above answers are "NO", then may proceed with Cephalosporin use.    Family history: family history includes Congestive Heart Failure in his sister; Diabetes in his brother and mother; Heart disease in his brother and father; Stroke in his brother.  Prior to Admission medications   Medication Sig Start Date End Date Taking? Authorizing Provider  acetaminophen (TYLENOL) 500 MG tablet Take 1,000 mg by mouth every 6 (six) hours as needed for moderate pain. Reported on 03/11/2016   Yes [provider]  allopurinol (ZYLOPRIM) 100 MG tablet TAKE 1 TABLET(100 MG) BY MOUTH DAILY 01/09/17  Yes [name], MD  amiodarone (PACERONE) 200 MG tablet TAKE 1 TABLET(200 MG) BY MOUTH DAILY 11/11/16  Yes [name], MD  aspirin 81 MG EC tablet Take 1 tablet (81 mg total) by mouth daily. Swallow whole. Patient taking differently: Take 81 mg by mouth daily.  09/03/12  Yes [name], MD  carvedilol (COREG) 6.25 MG tablet TAKE 1 TABLET(6.25 MG) BY MOUTH TWICE DAILY WITH A MEAL 03/31/17  Yes [name], MD  Cholecalciferol (VITAMIN D PO) Take 1 tablet by mouth daily.   Yes [provider]  fluticasone (FLONASE) 50 MCG/ACT nasal spray Place 1 spray into both nostrils daily.   Yes [provider]  furosemide (LASIX) 80 MG tablet Take 80 mg by mouth 2 (two) times daily.   Yes [provider]  losartan (COZAAR) 25 MG tablet Take 1 tablet (25 mg total) by mouth daily. 12/15/16  Yes Calvert Cantor, MD  potassium chloride SA (K-DUR,KLOR-CON) 20 MEQ tablet  Take 2 tablets (40 mEq total) by mouth daily. 12/15/16  Yes Calvert Cantor, MD  pravastatin (PRAVACHOL) 40 MG tablet TAKE 1 TABLET(40 MG) BY MOUTH DAILY 04/17/17  Yes [name], MD  PROAIR HFA 108 [phone] Base) MCG/ACT inhaler INHALE 1 PUFF INTO THE LUNGS EVERY 6 HOURS AS NEEDED FOR WHEEZING OR SHORTNESS OF BREATH 02/14/17  Yes [name], MD     Physical Exam: BP (!) 147/96   Pulse 85   Temp 98 F (36.7 C)   Resp 17   SpO2 93%  General appearance: Well-developed, obese adult male, alert and in no acute distress.   Eyes: Anicteric, conjunctiva pink, lids and lashes normal. PERRL.    ENT: No nasal deformity, discharge, epistaxis.  Hearing normal. OP moist without lesions.   Dentition partially edentulous. Lymph: No cervical, supraclavicular or axillary lymphadenopathy. Skin: Warm and dry.  No jaundice.  No suspicious rashes or lesions. Cardiac: RRR, nl S1-S2, no murmurs appreciated.  Capillary refill is brisk.  JVP not visible.  Bilateral mild nonpitting  LE edema.  Radial pulses 2+ and symmetric.  No carotid bruits.  DP pulses diminished. Respiratory: Normal respiratory rate and rhythm.  CTAB without rales or wheezes. GI: Abdomen soft without rigidity.  No TTP. No ascites, distension, no hepatosplenomegaly.   MSK: No deformities or effusions. Neuro: Pupils are 4 mm and reactive to 3 mm. Extraocular movements are intact, without nystagmus. Cranial nerve 5 is within normal limits. Cranial nerve 7 is symmetrical. Cranial nerve 8 is within normal limits. Cranial nerves 9 and 10 reveal equal palate elevation. Cranial nerve 11 reveals sternocleidomastoid strong. Cranial nerve 12 is midline. I do not note a deficit in motor strength testing in the upper and lower extremities bilaterally with normal motor, tone and bulk.No pronoator drift. Finger-to-nose testing is within normal limits. RAM normal.  No nystagmus.  Heel-to-shin normal.  No titubation.  Speech is fluent. Naming is  grossly intact. Attention span and concentration are within normal limits.   Psych: The patient is oriented to time, place and person. Behavior appropriate.  Affect normal.  Recall, recent and remote, as well as general fund of knowledge seem within normal limits. No evidence of aural or visual hallucinations or delusions.       Labs on Admission:  I have personally reviewed following labs and imaging studies: CBC:  Recent Labs Lab 04/25/17 1607 04/25/17 2227  WBC 8.0  --   NEUTROABS  --  4.7  HGB 14.9  --   HCT 44.9  --   MCV 89.6  --   PLT 213  --    Basic Metabolic Panel:  Recent Labs Lab 04/25/17 1607  NA 141  K 3.3*  CL 103  CO2 28  GLUCOSE 142*  BUN 19  CREATININE 1.20  CALCIUM 8.9   GFR: Estimated Creatinine Clearance: 87.7 mL/min (by C-G formula based on SCr of 1.2 mg/dL). Liver Function Tests:  Recent Labs Lab 04/25/17 2227  AST 22  ALT 19  ALKPHOS 60  BILITOT 0.9  PROT 6.5  ALBUMIN 3.5   No results for input(s): LIPASE, AMYLASE in the last 168 hours. No results for input(s): AMMONIA in the last 168 hours. Coagulation Profile:  Recent Labs Lab 04/25/17 2227  INR 1.09   Cardiac Enzymes: No results for input(s): CKTOTAL, CKMB, CKMBINDEX, TROPONINI in the last 168 hours. BNP (last 3 results) No results for input(s): PROBNP in the last 8760 hours. HbA1C: No results for input(s): HGBA1C in the last 72 hours. CBG: No results for input(s): GLUCAP in the last 168 hours. Lipid Profile: No results for input(s): CHOL, HDL, LDLCALC, TRIG, CHOLHDL, LDLDIRECT in the last 72 hours. Thyroid Function Tests: No results for input(s): TSH, T4TOTAL, FREET4, T3FREE, THYROIDAB in the last 72 hours. Anemia Panel: No results for input(s): VITAMINB12, FOLATE, FERRITIN, TIBC, IRON, RETICCTPCT in the last 72 hours. Sepsis Labs: Invalid input(s): PROCALCITONIN, LACTICIDVEN No results found for this or any previous visit (from the past 240 hour(s)).     Radiological Exams on Admission: Personally reviewed CT head, CTA head and neck reports reviewed: Ct Angio Head W Or Wo Contrast  Result Date: 04/26/2017 CLINICAL DATA:  Initial evaluation for acute dizziness. EXAM: CT ANGIOGRAPHY HEAD AND NECK TECHNIQUE: Multidetector CT imaging of the head and neck was performed using the standard protocol during bolus administration of intravenous contrast. Multiplanar CT image reconstructions and MIPs were obtained to evaluate the vascular anatomy. Carotid stenosis measurements (when applicable) are obtained utilizing NASCET criteria, using the distal internal carotid diameter as the  denominator. CONTRAST:  50 cc of Isovue 370. COMPARISON:  Prior CT from 04/25/2017. FINDINGS: CTA NECK FINDINGS Aortic arch: Visualized aortic arch of normal caliber with normal branch pattern. No high-grade stenosis about the origin of the great vessels. Sequelae of prior CABG noted. Visualized subclavian arteries widely patent. Right carotid system: Right common and internal carotid artery is widely patent without flow-limiting stenosis or other acute vascular abnormality. No significant atheromatous plaque about the right carotid bifurcation. Left carotid system: Left common carotid artery patent from its origin to the bifurcation without stenosis. Eccentric atheromatous plaque about the left bifurcation without hemodynamically significant stenosis. Left ICA widely patent distally to the skullbase without flow-limiting stenosis. Vertebral arteries: Both of the vertebral arteries arise from the subclavian arteries. Left vertebral artery dominant. Minimal plaque at the origin left vertebral artery without significant stenosis. Left vertebral artery is patent distally without stenosis or other acute vascular abnormality. The the Skeleton: No acute osseous abnormality. No worrisome lytic or blastic osseous lesions. Median sternotomy noted. Mild-to-moderate degenerative spondylolysis within  the cervical spine, greatest at C5-6 and C6-7. Other neck: No acute soft tissue abnormality within the neck. No adenopathy. Thyroid normal. Upper chest: Visualized upper chest within normal limits. Visualized lungs are clear. Few subcentimeter partially calcified granulomas noted. Review of the MIP images confirms the above findings CTA HEAD FINDINGS Anterior circulation: Petrous segments widely patent bilaterally. Scattered calcified atheromatous plaque within the cavernous/ supraclinoid ICAs without flow-limiting stenosis. ICA termini widely patent right A1 segment dominant and widely patent. Left A1 segment hypoplastic. Short-segment mild stenosis at the proximal left A1 segment noted. Anterior communicating artery normal. Anterior cerebral arteries patent to their distal aspects. M1 segments demonstrate mild atheromatous irregularity without high-grade flow-limiting stenosis. MCA bifurcations normal. No proximal M2 occlusion. Distal MCA branches well opacified and symmetric. Distal small vessel atheromatous irregularity seen within the MCA branches bilaterally. Posterior circulation: Left vertebral artery dominant. Multifocal atheromatous plaque throughout the left V4 segment with moderate to severe stenosis (up to 80%). Right vertebral artery hypoplastic and divides at the takeoff of the right posterior communicating artery. Scattered atheromatous irregularity within the right V4 segment without flow-limiting stenosis. Right PICA patent. Extensive atheromatous irregularity throughout the basilar artery which is diffusely irregular. Probable superimposed penetrating plaque at the posterior aspect of the distal basilar artery (series 7, image 120). There is associated narrowing of the basilar artery of up to approximately 50- 75%, greatest proximally at the vertebrobasilar junction. Basilar tip mildly ectatic and widely patent. Superior cerebral arteries patent bilaterally. Left SCA arises from the left P1  segment. Both of the posterior cerebral artery supplied primarily via the basilar. Normal takeoff of the left P1 segment accounts for previously questioned abnormality on prior CT. PCAs are patent to their distal aspects. Distal small vessel atheromatous irregularity noted within the PCAs bilaterally. Venous sinuses: Patent. Right transverse and sigmoid sinuses are hypoplastic. Anatomic variants: No significant anatomic variant. No aneurysm or vascular malformation. Delayed phase: No pathologic enhancement. Review of the MIP images confirms the above findings IMPRESSION: CTA NECK IMPRESSION: 1. Negative CTA of the neck. No hemodynamically significant or critical stenosis. 2. Atheromatous plaque about the left carotid bifurcation without significant stenosis. No significant atheromatous disease within the right carotid artery system. 3. Widely patent vertebral arteries within the neck. CTA HEAD IMPRESSION: 1. Negative CTA for large or proximal arterial branch occlusion. 2. 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 Associated  multifocal moderate to severe left V4 stenoses (up to approximately 80%), with multifocal basilar irregularity and stenoses of up to approximately 50-75%. 3. Previously questioned abnormality at the basilar tip is a normal vascular infundibulum. No aneurysm identified. 4. Moderate atheromatous irregularity throughout the anterior circulation without flow-limiting or correctable stenosis. Electronically Signed   By: Rise Mu M.D.   On: 04/26/2017 04:37   Ct Head Wo Contrast  Result Date: 04/25/2017 CLINICAL DATA:  Dizziness today. History of hypertension, atrial fibrillation, long-term anti coagulation. EXAM: CT HEAD WITHOUT CONTRAST TECHNIQUE: Contiguous axial images were obtained from the base of the skull through the vertex without intravenous contrast.  COMPARISON:  None. FINDINGS: BRAIN: No intraparenchymal hemorrhage, mass effect nor midline shift. The ventricles and sulci are normal for age. Patchy to confluent supratentorial white matter hypodensities. No acute large vascular territory infarcts. No abnormal extra-axial fluid collections. Basal cisterns are patent. VASCULAR: Dense dolicoectatic intracranial vessels. Moderate carotid siphon and severe vertebrobasilar calcific atherosclerosis. Two wide-necked outpouchings basilar tip measuring to 4 mm. SKULL: No skull fracture. Multiple calvarial presumed of vascular lakes. No significant scalp soft tissue swelling. SINUSES/ORBITS: The mastoid air-cells and included paranasal sinuses are well-aerated.The included ocular globes and orbital contents are non-suspicious. Status post bilateral ocular lens implants. OTHER: Multiple absent teeth. IMPRESSION: Dense dolichoectatic intracranial vessels compatible with hemoconcentration and chronic hypertension. Superimposed basilar tip infundibuli versus aneurysm. Severe vertebrobasilar calcific atherosclerosis. Recommend CT angiography for further characterization. Moderate to severe chronic small vessel ischemic disease. Electronically Signed   By: Awilda Metro M.D.   On: 04/25/2017 16:31   Ct Angio Neck W And/or Wo Contrast  Result Date: 04/26/2017 CLINICAL DATA:  Initial evaluation for acute dizziness. EXAM: CT ANGIOGRAPHY HEAD AND NECK TECHNIQUE: Multidetector CT imaging of the head and neck was performed using the standard protocol during bolus administration of intravenous contrast. Multiplanar CT image reconstructions and MIPs were obtained to evaluate the vascular anatomy. Carotid stenosis measurements (when applicable) are obtained utilizing NASCET criteria, using the distal internal carotid diameter as the denominator. CONTRAST:  50 cc of Isovue 370. COMPARISON:  Prior CT from 04/25/2017. FINDINGS: CTA NECK FINDINGS Aortic arch: Visualized aortic arch of  normal caliber with normal branch pattern. No high-grade stenosis about the origin of the great vessels. Sequelae of prior CABG noted. Visualized subclavian arteries widely patent. Right carotid system: Right common and internal carotid artery is widely patent without flow-limiting stenosis or other acute vascular abnormality. No significant atheromatous plaque about the right carotid bifurcation. Left carotid system: Left common carotid artery patent from its origin to the bifurcation without stenosis. Eccentric atheromatous plaque about the left bifurcation without hemodynamically significant stenosis. Left ICA widely patent distally to the skullbase without flow-limiting stenosis. Vertebral arteries: Both of the vertebral arteries arise from the subclavian arteries. Left vertebral artery dominant. Minimal plaque at the origin left vertebral artery without significant stenosis. Left vertebral artery is patent distally without stenosis or other acute vascular abnormality. The the Skeleton: No acute osseous abnormality. No worrisome lytic or blastic osseous lesions. Median sternotomy noted. Mild-to-moderate degenerative spondylolysis within the cervical spine, greatest at C5-6 and C6-7. Other neck: No acute soft tissue abnormality within the neck. No adenopathy. Thyroid normal. Upper chest: Visualized upper chest within normal limits. Visualized lungs are clear. Few subcentimeter partially calcified granulomas noted. Review of the MIP images confirms the above findings CTA HEAD FINDINGS Anterior circulation: Petrous segments widely patent bilaterally. Scattered calcified atheromatous plaque within the cavernous/ supraclinoid ICAs without flow-limiting stenosis. ICA  termini widely patent right A1 segment dominant and widely patent. Left A1 segment hypoplastic. Short-segment mild stenosis at the proximal left A1 segment noted. Anterior communicating artery normal. Anterior cerebral arteries patent to their distal  aspects. M1 segments demonstrate mild atheromatous irregularity without high-grade flow-limiting stenosis. MCA bifurcations normal. No proximal M2 occlusion. Distal MCA branches well opacified and symmetric. Distal small vessel atheromatous irregularity seen within the MCA branches bilaterally. Posterior circulation: Left vertebral artery dominant. Multifocal atheromatous plaque throughout the left V4 segment with moderate to severe stenosis (up to 80%). Right vertebral artery hypoplastic and divides at the takeoff of the right posterior communicating artery. Scattered atheromatous irregularity within the right V4 segment without flow-limiting stenosis. Right PICA patent. Extensive atheromatous irregularity throughout the basilar artery which is diffusely irregular. Probable superimposed penetrating plaque at the posterior aspect of the distal basilar artery (series 7, image 120). There is associated narrowing of the basilar artery of up to approximately 50- 75%, greatest proximally at the vertebrobasilar junction. Basilar tip mildly ectatic and widely patent. Superior cerebral arteries patent bilaterally. Left SCA arises from the left P1 segment. Both of the posterior cerebral artery supplied primarily via the basilar. Normal takeoff of the left P1 segment accounts for previously questioned abnormality on prior CT. PCAs are patent to their distal aspects. Distal small vessel atheromatous irregularity noted within the PCAs bilaterally. Venous sinuses: Patent. Right transverse and sigmoid sinuses are hypoplastic. Anatomic variants: No significant anatomic variant. No aneurysm or vascular malformation. Delayed phase: No pathologic enhancement. Review of the MIP images confirms the above findings IMPRESSION: CTA NECK IMPRESSION: 1. Negative CTA of the neck. No hemodynamically significant or critical stenosis. 2. Atheromatous plaque about the left carotid bifurcation without significant stenosis. No significant  atheromatous disease within the right carotid artery system. 3. Widely patent vertebral arteries within the neck. CTA HEAD IMPRESSION: 1. Negative CTA for large or proximal arterial branch occlusion. 2. 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 3. Previously questioned abnormality at the basilar tip is a normal vascular infundibulum. No aneurysm identified. 4. Moderate atheromatous irregularity throughout the anterior circulation without flow-limiting or correctable stenosis. Electronically Signed   By: Rise Mu M.D.   On: 04/26/2017 04:37     EKG: Independently reviewed. Rate 107, QTc 534, atrial fibrillation.  Echocardiogram 2018: EF 30-35% Report reviewed.        Assessment/Plan  1. Acute Dizziness:  TIA possible.  Occurred while sitting, doubt orthostasis.  Patient concerned about Afib, but he is chronically in Afib, HR normal here.  MRI not possible to obtain, patient refused.   -Admit to telemetry -Neuro checks, NIHSS per protocol -Restart Xarelto, continue baby aspirin -Lipids, hemoglobin A1c ordered -Echocardiogram ordered -PT/OT consultation -Consult to Neurology, appreciate recommendations -Check orthostatic VSS   2. Hypokalemia:  -Additional dose K -Check mag -Continue home potassium  3. Hypertension, chronic systolic CHF, coronary disease: -Continue beta blocker, furosemide, ACE inhibitor, statin, aspirin  4. Atrial fibrillation:  CHADS2-VASc 6. On amiodarone. Xarelto was stopped for an "eye bleed".  Per patient, his Ophthalmologist Dr. Sherryll Burger did not recommend stopping [facility], this was recommended by Dr. [name]. -Continue amiodarone, BB -Restart Xarelto  5. Prolonged  QT:  -Replete K -Check mag -Repeat ECG tomorrow, monitor on tele, avoid QT prolonging meds  6. Other  medications:  -Continue allopurinol      DVT prophylaxis: Xarelto  Code Status: FULL  Family Communication: None present  Disposition Plan: Anticipate Stroke work up as above and consult to ancillary services.  Expect discharge within 1-2 days. Consults called: Neurology Admission status: Telemetry, OBS status  Core measures: -VTE prophylaxis ordered at time of admission -Aspirin ordered at admission -Atrial fibrillation: present, AC restartd -tPA not given because of symptoms mild -Dysphagia screen ordered in ER -Lipids ordered -PT eval ordered -Nonsmoker    Medical decision making: Patient seen at 6:00 AM on 04/26/2017.  The patient was discussed with Dr. Blinda Leatherwood. What exists of the patient's chart was reviewed in depth and summarized above.  Clinical condition: stable.       Alberteen Sam [facility] Pager [phone]

## 2017-04-26 NOTE — Progress Notes (Signed)
ANTICOAGULATION CONSULT NOTE - Initial Consult  Pharmacy Consult for apixiban Indication: atrial fibrillation  Allergies  Allergen Reactions  . Niacin And Related Other (See Comments)    FLUSHING  . Penicillins Itching and Rash    Has patient had a PCN reaction causing immediate rash, facial/tongue/throat swelling, SOB or lightheadedness with hypotension:YES Has patient had a PCN reaction causing severe rash involving mucus membranes or skin necrosis: NO Has patient had a PCN reaction that required hospitalization NO Has patient had a PCN reaction occurring within the last 10 years: NO If all of the above answers are "NO", then may proceed with Cephalosporin use.    Vital Signs: Temp: 97.7 F (36.5 C) (05/23 0800) Temp Source: Oral (05/23 0800) BP: 146/91 (05/23 0800) Pulse Rate: 72 (05/23 0730)  Labs:  Recent Labs  04/25/17 1607 04/25/17 2227  HGB 14.9  --   HCT 44.9  --   PLT 213  --   APTT  --  29  LABPROT  --  14.1  INR  --  1.09  CREATININE 1.20  --     Estimated Creatinine Clearance: 87.7 mL/min (by C-G formula based on SCr of 1.2 mg/dL).   Medical History: Past Medical History:  Diagnosis Date  . Anxiety   . CHF (congestive heart failure) (HCC)   . Dysrhythmia    ATRIAL FIBRILATION, resolved with cardioversion  . GERD (gastroesophageal reflux disease)   . Hyperlipidemia   . Hypertension   . Hypertensive heart disease   . Kidney stones   . Obesity (BMI 30-39.9)   . Osteoarthritis   . PONV (postoperative nausea and vomiting)   . Shortness of breath dyspnea     Medications:  Prescriptions Prior to Admission  Medication Sig Dispense Refill Last Dose  . acetaminophen (TYLENOL) 500 MG tablet Take 1,000 mg by mouth every 6 (six) hours as needed for moderate pain. Reported on 03/11/2016   Past Week at Unknown time  . allopurinol (ZYLOPRIM) 100 MG tablet TAKE 1 TABLET(100 MG) BY MOUTH DAILY 90 tablet 0 04/25/2017 at Unknown time  . amiodarone (PACERONE) 200  MG tablet TAKE 1 TABLET(200 MG) BY MOUTH DAILY 90 tablet 1 04/25/2017 at Unknown time  . aspirin 81 MG EC tablet Take 1 tablet (81 mg total) by mouth daily. Swallow whole. (Patient taking differently: Take 81 mg by mouth daily. ) 30 tablet 12 04/25/2017 at Unknown time  . carvedilol (COREG) 6.25 MG tablet TAKE 1 TABLET(6.25 MG) BY MOUTH TWICE DAILY WITH A MEAL 180 tablet 0 04/25/2017 at 0830  . Cholecalciferol (VITAMIN D PO) Take 1 tablet by mouth daily.   04/25/2017 at Unknown time  . fluticasone (FLONASE) 50 MCG/ACT nasal spray Place 1 spray into both nostrils daily.   04/25/2017 at Unknown time  . furosemide (LASIX) 80 MG tablet Take 80 mg by mouth 2 (two) times daily.   04/25/2017 at Unknown time  . losartan (COZAAR) 25 MG tablet Take 1 tablet (25 mg total) by mouth daily. 30 tablet 0 04/25/2017 at Unknown time  . potassium chloride SA (K-DUR,KLOR-CON) 20 MEQ tablet Take 2 tablets (40 mEq total) by mouth daily. 90 tablet 3 04/25/2017 at Unknown time  . pravastatin (PRAVACHOL) 40 MG tablet TAKE 1 TABLET(40 MG) BY MOUTH DAILY 90 tablet 1 04/24/2017 at Unknown time  . PROAIR HFA 108 (90 Base) MCG/ACT inhaler INHALE 1 PUFF INTO THE LUNGS EVERY 6 HOURS AS NEEDED FOR WHEEZING OR SHORTNESS OF BREATH 8.5 g 0 04/25/2017 at Unknown time  Scheduled:  . potassium chloride  40 mEq Oral Once    Assessment: 66 yo male with history of afib and here with possible CVA. He was on Xarelto recently but this was discontinued due to left intraocular hemorrhage. Pharmacy consulted to dose apixiban -Hg= 14.9, plt= 213, SCr= 1.2  Goal of Therapy:  Monitor platelets by anticoagulation protocol: Yes   Plan:  -apixiban 5mg  po bid -Will provide patient education  Harland German, Pharm D 04/26/2017 9:34 AM

## 2017-04-27 ENCOUNTER — Observation Stay (HOSPITAL_BASED_OUTPATIENT_CLINIC_OR_DEPARTMENT_OTHER): Payer: Medicare Other

## 2017-04-27 DIAGNOSIS — I110 Hypertensive heart disease with heart failure: Secondary | ICD-10-CM | POA: Diagnosis not present

## 2017-04-27 DIAGNOSIS — I340 Nonrheumatic mitral (valve) insufficiency: Secondary | ICD-10-CM

## 2017-04-27 DIAGNOSIS — G450 Vertebro-basilar artery syndrome: Secondary | ICD-10-CM

## 2017-04-27 DIAGNOSIS — E876 Hypokalemia: Secondary | ICD-10-CM | POA: Diagnosis not present

## 2017-04-27 DIAGNOSIS — I480 Paroxysmal atrial fibrillation: Secondary | ICD-10-CM | POA: Diagnosis not present

## 2017-04-27 DIAGNOSIS — I5023 Acute on chronic systolic (congestive) heart failure: Secondary | ICD-10-CM | POA: Diagnosis not present

## 2017-04-27 LAB — HEMOGLOBIN A1C
Hgb A1c MFr Bld: 6.1 % — ABNORMAL HIGH (ref 4.8–5.6)
MEAN PLASMA GLUCOSE: 128 mg/dL

## 2017-04-27 LAB — ECHOCARDIOGRAM COMPLETE

## 2017-04-27 MED ORDER — RIVAROXABAN 20 MG PO TABS: 20.0000 mg | ORAL_TABLET | Freq: Every day | ORAL | Status: DC

## 2017-04-27 MED ORDER — PERFLUTREN LIPID MICROSPHERE
1.0000 mL | INTRAVENOUS | Status: AC | PRN
  Administered 2017-04-27: 2 mL via INTRAVENOUS
  Filled 2017-04-27: qty 10

## 2017-04-27 MED ORDER — ATORVASTATIN CALCIUM 40 MG PO TABS: 40.0000 mg | ORAL_TABLET | Freq: Every day | ORAL | 0 refills | Status: DC

## 2017-04-27 NOTE — Progress Notes (Addendum)
PROGRESS NOTE    Charles Mckinney  AVW:[id number] DOB: 1951-03-03 DOA: 04/25/2017 PCP: [name], MD   Outpatient Specialists:    Brief Narrative:  Charles Mckinney is a 66 y.o. male with a past medical history significant for CHF EF 30%, Afib off Xarelto, CAD s/p CABG in 2016, HTN, and MO who presents with dizziness for 1 day.  The patient was in his usual state of health until today at lunch, he had immediate profuse diarrhea. Then about one hour later he was sitting in his recliner when he felt sudden onset of "wooziness", like he was [place] "pass out".  Lightheadedness, not vertigo.  No palpiations, chest pain, dyspnea. He sat forward and shook his head which started to make it better, then he stood up and felt like he was "staggering and off balance", so he decided to come to the ER.   Assessment & Plan:   Principal Problem:   TIA (transient ischemic attack) Active Problems:   Hypertensive heart disease   Paroxysmal atrial fibrillation (HCC)   Chronic systolic heart failure (HCC)   Hypokalemia   Prolonged QT interval   Acute Dizziness:  TIA possible vs CVA Not orthostasis.   -unable to do MRI -patient wanted eliquis but was too expensive per month ($45) so will go back on xarelto -Lipids: LDL 109- on lipitor - hemoglobin A1c 6.1 -Echocardiogram ordered and still pending -PT/OT consultation- home health -Consult to Neurology, appreciate recommendations  Hypokalemia:  -Additional dose K -mag normal -Continue home potassium  Hypertension, chronic systolic CHF, coronary disease: -Continue beta blocker, furosemide, ACE inhibitor, statin, aspirin  Atrial fibrillation:  CHADS2-VASc 6. On amiodarone. Xarelto was stopped for an "eye bleed".  Per patient, his Ophthalmologist Dr. Sherryll Burger did not recommend stopping [facility], this was recommended by Dr. [name]. -Continue amiodarone, BB -start eliquis  Prolonged QT:  -Replete K -Check mag -Repeat ECG still shows  prolongation but improved from 534 to 513     DVT prophylaxis:  Fully anticoagulated   Code Status: Full Code   Family Communication:   Disposition Plan:     Consultants:    neurology   Subjective: Dizziness resolved -working with OT  Objective: Vitals:   04/26/17 2000 04/26/17 2200 04/27/17 0000 04/27/17 0430  BP: 134/72 117/85 (!) 122/43 138/72  Pulse: 77 73 94 88  Resp: 20 18 18 18   Temp: 98.2 F (36.8 C) 98.7 F (37.1 C) 97.6 F (36.4 C) 97.7 F (36.5 C)  TempSrc: Oral Oral Oral Oral  SpO2: 96% 95% 96% 96%    Intake/Output Summary (Last 24 hours) at 04/27/17 0930 Last data filed at 04/27/17 0600  Gross per 24 hour  Intake              360 ml  Output              950 ml  Net             -590 ml   There were no vitals filed for this visit.  Examination:  General exam: Appears calm and comfortable  Respiratory system: Clear to auscultation. Respiratory effort normal. Cardiovascular system: irregular. No JVD, murmurs, rubs, gallops or clicks. Min LE edema. Gastrointestinal system: Abdomen is obese, soft and nontender. No organomegaly or masses felt. Normal bowel sounds heard. Central nervous system: Alert and oriented. No focal neurological deficits. Extremities: Symmetric 5 x 5 power. Skin: No rashes, lesions or ulcers     Data Reviewed: I have personally reviewed following  labs and imaging studies  CBC:  Recent Labs Lab 04/25/17 1607 04/25/17 2227  WBC 8.0  --   NEUTROABS  --  4.7  HGB 14.9  --   HCT 44.9  --   MCV 89.6  --   PLT 213  --    Basic Metabolic Panel:  Recent Labs Lab 04/25/17 1607 04/26/17 1209  NA 141  --   K 3.3*  --   CL 103  --   CO2 28  --   GLUCOSE 142*  --   BUN 19  --   CREATININE 1.20  --   CALCIUM 8.9  --   MG  --  2.0   GFR: Estimated Creatinine Clearance: 87.7 mL/min (by C-G formula based on SCr of 1.2 mg/dL). Liver Function Tests:  Recent Labs Lab 04/25/17 2227  AST 22  ALT 19    ALKPHOS 60  BILITOT 0.9  PROT 6.5  ALBUMIN 3.5   No results for input(s): LIPASE, AMYLASE in the last 168 hours. No results for input(s): AMMONIA in the last 168 hours. Coagulation Profile:  Recent Labs Lab 04/25/17 2227  INR 1.09   Cardiac Enzymes: No results for input(s): CKTOTAL, CKMB, CKMBINDEX, TROPONINI in the last 168 hours. BNP (last 3 results) No results for input(s): PROBNP in the last 8760 hours. HbA1C:  Recent Labs  04/26/17 1209  HGBA1C 6.1*   CBG: No results for input(s): GLUCAP in the last 168 hours. Lipid Profile:  Recent Labs  04/26/17 1209  CHOL 197  HDL 41  LDLCALC 109*  TRIG 236*  CHOLHDL 4.8   Thyroid Function Tests: No results for input(s): TSH, T4TOTAL, FREET4, T3FREE, THYROIDAB in the last 72 hours. Anemia Panel: No results for input(s): VITAMINB12, FOLATE, FERRITIN, TIBC, IRON, RETICCTPCT in the last 72 hours. Urine analysis:    Component Value Date/Time   COLORURINE YELLOW 04/25/2017 2221   APPEARANCEUR CLEAR 04/25/2017 2221   LABSPEC 1.019 04/25/2017 2221   PHURINE 5.0 04/25/2017 2221   GLUCOSEU NEGATIVE 04/25/2017 2221   HGBUR NEGATIVE 04/25/2017 2221   BILIRUBINUR NEGATIVE 04/25/2017 2221   KETONESUR NEGATIVE 04/25/2017 2221   PROTEINUR NEGATIVE 04/25/2017 2221   UROBILINOGEN 0.2 09/21/2015 1557   NITRITE NEGATIVE 04/25/2017 2221   LEUKOCYTESUR NEGATIVE 04/25/2017 2221   Sepsis Labs: @LABRCNTIP (procalcitonin:4,lacticidven:4)  )No results found for this or any previous visit (from the past 240 hour(s)).    Anti-infectives    None       Radiology Studies: Ct Angio Head W Or Wo Contrast  Result Date: 04/26/2017 CLINICAL DATA:  Initial evaluation for acute dizziness. EXAM: CT ANGIOGRAPHY HEAD AND NECK TECHNIQUE: Multidetector CT imaging of the head and neck was performed using the standard protocol during bolus administration of intravenous contrast. Multiplanar CT image reconstructions and MIPs were obtained to  evaluate the vascular anatomy. Carotid stenosis measurements (when applicable) are obtained utilizing NASCET criteria, using the distal internal carotid diameter as the denominator. CONTRAST:  50 cc of Isovue 370. COMPARISON:  Prior CT from 04/25/2017. FINDINGS: CTA NECK FINDINGS Aortic arch: Visualized aortic arch of normal caliber with normal branch pattern. No high-grade stenosis about the origin of the great vessels. Sequelae of prior CABG noted. Visualized subclavian arteries widely patent. Right carotid system: Right common and internal carotid artery is widely patent without flow-limiting stenosis or other acute vascular abnormality. No significant atheromatous plaque about the right carotid bifurcation. Left carotid system: Left common carotid artery patent from its origin to the bifurcation without stenosis. Eccentric  atheromatous plaque about the left bifurcation without hemodynamically significant stenosis. Left ICA widely patent distally to the skullbase without flow-limiting stenosis. Vertebral arteries: Both of the vertebral arteries arise from the subclavian arteries. Left vertebral artery dominant. Minimal plaque at the origin left vertebral artery without significant stenosis. Left vertebral artery is patent distally without stenosis or other acute vascular abnormality. The the Skeleton: No acute osseous abnormality. No worrisome lytic or blastic osseous lesions. Median sternotomy noted. Mild-to-moderate degenerative spondylolysis within the cervical spine, greatest at C5-6 and C6-7. Other neck: No acute soft tissue abnormality within the neck. No adenopathy. Thyroid normal. Upper chest: Visualized upper chest within normal limits. Visualized lungs are clear. Few subcentimeter partially calcified granulomas noted. Review of the MIP images confirms the above findings CTA HEAD FINDINGS Anterior circulation: Petrous segments widely patent bilaterally. Scattered calcified atheromatous plaque within the  cavernous/ supraclinoid ICAs without flow-limiting stenosis. ICA termini widely patent right A1 segment dominant and widely patent. Left A1 segment hypoplastic. Short-segment mild stenosis at the proximal left A1 segment noted. Anterior communicating artery normal. Anterior cerebral arteries patent to their distal aspects. M1 segments demonstrate mild atheromatous irregularity without high-grade flow-limiting stenosis. MCA bifurcations normal. No proximal M2 occlusion. Distal MCA branches well opacified and symmetric. Distal small vessel atheromatous irregularity seen within the MCA branches bilaterally. Posterior circulation: Left vertebral artery dominant. Multifocal atheromatous plaque throughout the left V4 segment with moderate to severe stenosis (up to 80%). Right vertebral artery hypoplastic and divides at the takeoff of the right posterior communicating artery. Scattered atheromatous irregularity within the right V4 segment without flow-limiting stenosis. Right PICA patent. Extensive atheromatous irregularity throughout the basilar artery which is diffusely irregular. Probable superimposed penetrating plaque at the posterior aspect of the distal basilar artery (series 7, image 120). There is associated narrowing of the basilar artery of up to approximately 50- 75%, greatest proximally at the vertebrobasilar junction. Basilar tip mildly ectatic and widely patent. Superior cerebral arteries patent bilaterally. Left SCA arises from the left P1 segment. Both of the posterior cerebral artery supplied primarily via the basilar. Normal takeoff of the left P1 segment accounts for previously questioned abnormality on prior CT. PCAs are patent to their distal aspects. Distal small vessel atheromatous irregularity noted within the PCAs bilaterally. Venous sinuses: Patent. Right transverse and sigmoid sinuses are hypoplastic. Anatomic variants: No significant anatomic variant. No aneurysm or vascular malformation.  Delayed phase: No pathologic enhancement. Review of the MIP images confirms the above findings IMPRESSION: CTA NECK IMPRESSION: 1. Negative CTA of the neck. No hemodynamically significant or critical stenosis. 2. Atheromatous plaque about the left carotid bifurcation without significant stenosis. No significant atheromatous disease within the right carotid artery system. 3. Widely patent vertebral arteries within the neck. CTA HEAD IMPRESSION: 1. Negative CTA for large or proximal arterial branch occlusion. 2. 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 3. Previously questioned abnormality at the basilar tip is a normal vascular infundibulum. No aneurysm identified. 4. Moderate atheromatous irregularity throughout the anterior circulation without flow-limiting or correctable stenosis. Electronically Signed   By: Rise Mu M.D.   On: 04/26/2017 04:37   Ct Head Wo Contrast  Result Date: 04/25/2017 CLINICAL DATA:  Dizziness today. History of hypertension, atrial fibrillation, long-term anti coagulation. EXAM: CT HEAD WITHOUT CONTRAST TECHNIQUE: Contiguous axial  images were obtained from the base of the skull through the vertex without intravenous contrast. COMPARISON:  None. FINDINGS: BRAIN: No intraparenchymal hemorrhage, mass effect nor midline shift. The ventricles and sulci are normal for age. Patchy to confluent supratentorial white matter hypodensities. No acute large vascular territory infarcts. No abnormal extra-axial fluid collections. Basal cisterns are patent. VASCULAR: Dense dolicoectatic intracranial vessels. Moderate carotid siphon and severe vertebrobasilar calcific atherosclerosis. Two wide-necked  outpouchings basilar tip measuring to 4 mm. SKULL: No skull fracture. Multiple calvarial presumed of vascular lakes. No significant scalp soft tissue swelling. SINUSES/ORBITS: The mastoid air-cells and included paranasal sinuses are well-aerated.The included ocular globes and orbital contents are non-suspicious. Status post bilateral ocular lens implants. OTHER: Multiple absent teeth. IMPRESSION: Dense dolichoectatic intracranial vessels compatible with hemoconcentration and chronic hypertension. Superimposed basilar tip infundibuli versus aneurysm. Severe vertebrobasilar calcific atherosclerosis. Recommend CT angiography for further characterization. Moderate to severe chronic small vessel ischemic disease. Electronically Signed   By: Awilda Metro M.D.   On: 04/25/2017 16:31   Ct Angio Neck W And/or Wo Contrast  Result Date: 04/26/2017 CLINICAL DATA:  Initial evaluation for acute dizziness. EXAM: CT ANGIOGRAPHY HEAD AND NECK TECHNIQUE: Multidetector CT imaging of the head and neck was performed using the standard protocol during bolus administration of intravenous contrast. Multiplanar CT image reconstructions and MIPs were obtained to evaluate the vascular anatomy. Carotid stenosis measurements (when applicable) are obtained utilizing NASCET criteria, using the distal internal carotid diameter as the denominator. CONTRAST:  50 cc of Isovue 370. COMPARISON:  Prior CT from 04/25/2017. FINDINGS: CTA NECK FINDINGS Aortic arch: Visualized aortic arch of normal caliber with normal branch pattern. No high-grade stenosis about the origin of the great vessels. Sequelae of prior CABG noted. Visualized subclavian arteries widely patent. Right carotid system: Right common and internal carotid artery is widely patent without flow-limiting stenosis or other acute vascular abnormality. No significant atheromatous plaque about the right carotid bifurcation. Left carotid system: Left common carotid artery patent from its  origin to the bifurcation without stenosis. Eccentric atheromatous plaque about the left bifurcation without hemodynamically significant stenosis. Left ICA widely patent distally to the skullbase without flow-limiting stenosis. Vertebral arteries: Both of the vertebral arteries arise from the subclavian arteries. Left vertebral artery dominant. Minimal plaque at the origin left vertebral artery without significant stenosis. Left vertebral artery is patent distally without stenosis or other acute vascular abnormality. The the Skeleton: No acute osseous abnormality. No worrisome lytic or blastic osseous lesions. Median sternotomy noted. Mild-to-moderate degenerative spondylolysis within the cervical spine, greatest at C5-6 and C6-7. Other neck: No acute soft tissue abnormality within the neck. No adenopathy. Thyroid normal. Upper chest: Visualized upper chest within normal limits. Visualized lungs are clear. Few subcentimeter partially calcified granulomas noted. Review of the MIP images confirms the above findings CTA HEAD FINDINGS Anterior circulation: Petrous segments widely patent bilaterally. Scattered calcified atheromatous plaque within the cavernous/ supraclinoid ICAs without flow-limiting stenosis. ICA termini widely patent right A1 segment dominant and widely patent. Left A1 segment hypoplastic. Short-segment mild stenosis at the proximal left A1 segment noted. Anterior communicating artery normal. Anterior cerebral arteries patent to their distal aspects. M1 segments demonstrate mild atheromatous irregularity without high-grade flow-limiting stenosis. MCA bifurcations normal. No proximal M2 occlusion. Distal MCA branches well opacified and symmetric. Distal small vessel atheromatous irregularity seen within the MCA branches bilaterally. Posterior circulation: Left vertebral artery dominant. Multifocal atheromatous plaque throughout the left V4 segment with moderate to severe stenosis (up to 80%). Right  vertebral  artery hypoplastic and divides at the takeoff of the right posterior communicating artery. Scattered atheromatous irregularity within the right V4 segment without flow-limiting stenosis. Right PICA patent. Extensive atheromatous irregularity throughout the basilar artery which is diffusely irregular. Probable superimposed penetrating plaque at the posterior aspect of the distal basilar artery (series 7, image 120). There is associated narrowing of the basilar artery of up to approximately 50- 75%, greatest proximally at the vertebrobasilar junction. Basilar tip mildly ectatic and widely patent. Superior cerebral arteries patent bilaterally. Left SCA arises from the left P1 segment. Both of the posterior cerebral artery supplied primarily via the basilar. Normal takeoff of the left P1 segment accounts for previously questioned abnormality on prior CT. PCAs are patent to their distal aspects. Distal small vessel atheromatous irregularity noted within the PCAs bilaterally. Venous sinuses: Patent. Right transverse and sigmoid sinuses are hypoplastic. Anatomic variants: No significant anatomic variant. No aneurysm or vascular malformation. Delayed phase: No pathologic enhancement. Review of the MIP images confirms the above findings IMPRESSION: CTA NECK IMPRESSION: 1. Negative CTA of the neck. No hemodynamically significant or critical stenosis. 2. Atheromatous plaque about the left carotid bifurcation without significant stenosis. No significant atheromatous disease within the right carotid artery system. 3. Widely patent vertebral arteries within the neck. CTA HEAD IMPRESSION: 1. Negative CTA for large or proximal arterial branch occlusion. 2. 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 Associated multifocal moderate to severe left V4 stenoses (up to approximately 80%), with multifocal  basilar irregularity and stenoses of up to approximately 50-75%. 3. Previously questioned abnormality at the basilar tip is a normal vascular infundibulum. No aneurysm identified. 4. Moderate atheromatous irregularity throughout the anterior circulation without flow-limiting or correctable stenosis. Electronically Signed   By: Rise Mu M.D.   On: 04/26/2017 04:37        Scheduled Meds: . allopurinol  100 mg Oral Daily  . amiodarone  200 mg Oral Daily  . apixaban  5 mg Oral BID  . aspirin  81 mg Oral Daily  . atorvastatin  40 mg Oral q1800  . carvedilol  6.25 mg Oral BID WC  . furosemide  80 mg Oral BID  . losartan  25 mg Oral Daily  . potassium chloride SA  40 mEq Oral Daily  . potassium chloride  40 mEq Oral Once   Continuous Infusions:   LOS: 0 days    Time spent: 25 min    JESSICA U VANN, DO [facility] Pager [phone]  If 7PM-7AM, please contact night-coverage [email or web address] Password [email or web address] 04/27/2017, 9:30 AM

## 2017-04-27 NOTE — Progress Notes (Addendum)
ANTICOAGULATION CONSULT NOTE - Initial Consult  Pharmacy Consult for apixiban> rivaroxaban Indication: atrial fibrillation  Allergies  Allergen Reactions  . Niacin And Related Other (See Comments)    FLUSHING  . Penicillins Itching and Rash    Has patient had a PCN reaction causing immediate rash, facial/tongue/throat swelling, SOB or lightheadedness with hypotension:YES Has patient had a PCN reaction causing severe rash involving mucus membranes or skin necrosis: NO Has patient had a PCN reaction that required hospitalization NO Has patient had a PCN reaction occurring within the last 10 years: NO If all of the above answers are "NO", then may proceed with Cephalosporin use.    Vital Signs: Temp: 98 F (36.7 C) (05/24 0937) Temp Source: Oral (05/24 0937) BP: 128/75 (05/24 0937) Pulse Rate: 78 (05/24 0937)  Labs:  Recent Labs  04/25/17 1607 04/25/17 2227  HGB 14.9  --   HCT 44.9  --   PLT 213  --   APTT  --  29  LABPROT  --  14.1  INR  --  1.09  CREATININE 1.20  --     Estimated Creatinine Clearance: 87.7 mL/min (by C-G formula based on SCr of 1.2 mg/dL).   Medical History: Past Medical History:  Diagnosis Date  . Anxiety   . CHF (congestive heart failure) (HCC)   . Dysrhythmia    ATRIAL FIBRILATION, resolved with cardioversion  . GERD (gastroesophageal reflux disease)   . Hyperlipidemia   . Hypertension   . Hypertensive heart disease   . Kidney stones   . Obesity (BMI 30-39.9)   . Osteoarthritis   . PONV (postoperative nausea and vomiting)   . Shortness of breath dyspnea     Medications:  Prescriptions Prior to Admission  Medication Sig Dispense Refill Last Dose  . acetaminophen (TYLENOL) 500 MG tablet Take 1,000 mg by mouth every 6 (six) hours as needed for moderate pain. Reported on 03/11/2016   Past Week at Unknown time  . allopurinol (ZYLOPRIM) 100 MG tablet TAKE 1 TABLET(100 MG) BY MOUTH DAILY 90 tablet 0 04/25/2017 at Unknown time  . amiodarone  (PACERONE) 200 MG tablet TAKE 1 TABLET(200 MG) BY MOUTH DAILY 90 tablet 1 04/25/2017 at Unknown time  . aspirin 81 MG EC tablet Take 1 tablet (81 mg total) by mouth daily. Swallow whole. (Patient taking differently: Take 81 mg by mouth daily. ) 30 tablet 12 04/25/2017 at Unknown time  . carvedilol (COREG) 6.25 MG tablet TAKE 1 TABLET(6.25 MG) BY MOUTH TWICE DAILY WITH A MEAL 180 tablet 0 04/25/2017 at 0830  . Cholecalciferol (VITAMIN D PO) Take 1 tablet by mouth daily.   04/25/2017 at Unknown time  . fluticasone (FLONASE) 50 MCG/ACT nasal spray Place 1 spray into both nostrils daily.   04/25/2017 at Unknown time  . furosemide (LASIX) 80 MG tablet Take 80 mg by mouth 2 (two) times daily.   04/25/2017 at Unknown time  . losartan (COZAAR) 25 MG tablet Take 1 tablet (25 mg total) by mouth daily. 30 tablet 0 04/25/2017 at Unknown time  . potassium chloride SA (K-DUR,KLOR-CON) 20 MEQ tablet Take 2 tablets (40 mEq total) by mouth daily. 90 tablet 3 04/25/2017 at Unknown time  . pravastatin (PRAVACHOL) 40 MG tablet TAKE 1 TABLET(40 MG) BY MOUTH DAILY 90 tablet 1 04/24/2017 at Unknown time  . PROAIR HFA 108 (90 Base) MCG/ACT inhaler INHALE 1 PUFF INTO THE LUNGS EVERY 6 HOURS AS NEEDED FOR WHEEZING OR SHORTNESS OF BREATH 8.5 g 0 04/25/2017 at Unknown  time   Scheduled:  . allopurinol  100 mg Oral Daily  . amiodarone  200 mg Oral Daily  . apixaban  5 mg Oral BID  . aspirin  81 mg Oral Daily  . atorvastatin  40 mg Oral q1800  . carvedilol  6.25 mg Oral BID WC  . furosemide  80 mg Oral BID  . losartan  25 mg Oral Daily  . potassium chloride SA  40 mEq Oral Daily  . potassium chloride  40 mEq Oral Once    Assessment: 66 yo male with history of afib and here with possible CVA. He was on Xarelto recently but this was discontinued due to left intraocular hemorrhage. Plans were for apixiban but due to cost and patient preference to change back to Xarelto -Hg= 14.9, plt= 213, SCr= 1.2, CrCl > 50 -last apixiban dose was  04/27/17  Goal of Therapy:  Monitor platelets by anticoagulation protocol: Yes   Plan:  -Begin Xarelto 20mg  po daily with dinner (with last dose of apixiban given this morning will start Xarelto on 5/25 with lunch)  Harland German, Pharm D 04/27/2017 2:37 PM

## 2017-04-27 NOTE — Progress Notes (Signed)
Pt changed to Xarelto. Per pts drug coverage it will cost him $8.35/ month. Patient in agreement with the Xarelto. CM provided him 30 day free card. No further needs per CM.

## 2017-04-27 NOTE — Progress Notes (Signed)
  Echocardiogram 2D Echocardiogram has been performed.  Janalyn Harder 04/27/2017, 11:09 AM

## 2017-04-27 NOTE — Discharge Summary (Signed)
Physician Discharge Summary  Charles Mckinney:[id number] DOB: 02-Nov-1951 DOA: 04/25/2017  PCP: [name], MD  Admit date: 04/25/2017 Discharge date: 04/27/2017   Recommendations for Outpatient Follow-Up:   1. Patient unable to afford eliquis-- will be placed back on xarelto 2. Close follow up with Dr. [name] and Opthamology 3. Needs to be tested for OSA   Discharge Diagnosis:   Principal Problem:   TIA (transient ischemic attack) Active Problems:   Hypertensive heart disease   Paroxysmal atrial fibrillation (HCC)   Chronic systolic heart failure (HCC)   Hypokalemia   Prolonged QT interval   Discharge disposition:  Home  Discharge Condition: Improved.  Diet recommendation: Low sodium, heart healthy  Wound care: None.   History of Present Illness:   Charles Mckinney is a 66 y.o. male with a past medical history significant for CHF EF 30%, Afib off Xarelto, CAD s/p CABG in 2016, HTN, and MO who presents with dizziness for 1 day.  The patient was in his usual state of health until today at lunch, he had immediate profuse diarrhea. Then about one hour later he was sitting in his recliner when he felt sudden onset of "wooziness", like he was [place] "pass out".  Lightheadedness, not vertigo.  No palpiations, chest pain, dyspnea. He sat forward and shook his head which started to make it better, then he stood up and felt like he was "staggering and off balance", so he decided to come to the ER.   Hospital Course by Problem:   Acute Dizziness: TIA possible vs CVA Not orthostatic -unable to do MRI -patient wanted eliquis but was too expensive per month ($45) so will go back on xarelto -Lipids: LDL 109- on lipitor - hemoglobin A1c 6.1 Echocardiogram: Technically difficult; definity used; mild to moderate global   reduction in LV systolic function; mild LVH; mildly dilated   aortic root; mild MR; moderate LAE; trace TR with mildly elevated   pulmonary  pressure. -PT/OT consultation- home health -Consult to Neurology, appreciate recommendations- discussed with Dr. Roda Shutters (hold on ASA due to recent bleeding)  Hypokalemia: -Additional dose K -mag normal -Continue home potassium  Hypertension, chronic systolic CHF, coronary disease: -Continue beta blocker, furosemide, ACE inhibitor, statin, aspirin  Atrial fibrillation: CHADS2-VASc 6. On amiodarone. Xarelto was stopped for an "eye bleed". Per patient, his Ophthalmologist Dr. Sherryll Burger did not recommend stopping [facility], this was recommended by Dr. [name]. -Continue amiodarone, BB -resume xarelto  Prolonged QT: -Replete K -Repeat ECG still shows prolongation but improved from 534 to 513    Medical Consultants:    Neuro  Cards (phone)   Discharge Exam:   Vitals:   04/27/17 0430 04/27/17 0937  BP: 138/72 128/75  Pulse: 88 78  Resp: 18 17  Temp: 97.7 F (36.5 C) 98 F (36.7 C)   Vitals:   04/26/17 2200 04/27/17 0000 04/27/17 0430 04/27/17 0937  BP: 117/85 (!) 122/43 138/72 128/75  Pulse: 73 94 88 78  Resp: 18 18 18 17   Temp: 98.7 F (37.1 C) 97.6 F (36.4 C) 97.7 F (36.5 C) 98 F (36.7 C)  TempSrc: Oral Oral Oral Oral  SpO2: 95% 96% 96% 97%    Gen:  NAD    The results of significant diagnostics from this hospitalization (including imaging, microbiology, ancillary and laboratory) are listed below for reference.     Procedures and Diagnostic Studies:   Ct Angio Head W Or Wo Contrast  Result Date: 04/26/2017 CLINICAL DATA:  Initial evaluation for  acute dizziness. EXAM: CT ANGIOGRAPHY HEAD AND NECK TECHNIQUE: Multidetector CT imaging of the head and neck was performed using the standard protocol during bolus administration of intravenous contrast. Multiplanar CT image reconstructions and MIPs were obtained to evaluate the vascular anatomy. Carotid stenosis measurements (when applicable) are obtained utilizing NASCET criteria, using the distal internal carotid  diameter as the denominator. CONTRAST:  50 cc of Isovue 370. COMPARISON:  Prior CT from 04/25/2017. FINDINGS: CTA NECK FINDINGS Aortic arch: Visualized aortic arch of normal caliber with normal branch pattern. No high-grade stenosis about the origin of the great vessels. Sequelae of prior CABG noted. Visualized subclavian arteries widely patent. Right carotid system: Right common and internal carotid artery is widely patent without flow-limiting stenosis or other acute vascular abnormality. No significant atheromatous plaque about the right carotid bifurcation. Left carotid system: Left common carotid artery patent from its origin to the bifurcation without stenosis. Eccentric atheromatous plaque about the left bifurcation without hemodynamically significant stenosis. Left ICA widely patent distally to the skullbase without flow-limiting stenosis. Vertebral arteries: Both of the vertebral arteries arise from the subclavian arteries. Left vertebral artery dominant. Minimal plaque at the origin left vertebral artery without significant stenosis. Left vertebral artery is patent distally without stenosis or other acute vascular abnormality. The the Skeleton: No acute osseous abnormality. No worrisome lytic or blastic osseous lesions. Median sternotomy noted. Mild-to-moderate degenerative spondylolysis within the cervical spine, greatest at C5-6 and C6-7. Other neck: No acute soft tissue abnormality within the neck. No adenopathy. Thyroid normal. Upper chest: Visualized upper chest within normal limits. Visualized lungs are clear. Few subcentimeter partially calcified granulomas noted. Review of the MIP images confirms the above findings CTA HEAD FINDINGS Anterior circulation: Petrous segments widely patent bilaterally. Scattered calcified atheromatous plaque within the cavernous/ supraclinoid ICAs without flow-limiting stenosis. ICA termini widely patent right A1 segment dominant and widely patent. Left A1 segment  hypoplastic. Short-segment mild stenosis at the proximal left A1 segment noted. Anterior communicating artery normal. Anterior cerebral arteries patent to their distal aspects. M1 segments demonstrate mild atheromatous irregularity without high-grade flow-limiting stenosis. MCA bifurcations normal. No proximal M2 occlusion. Distal MCA branches well opacified and symmetric. Distal small vessel atheromatous irregularity seen within the MCA branches bilaterally. Posterior circulation: Left vertebral artery dominant. Multifocal atheromatous plaque throughout the left V4 segment with moderate to severe stenosis (up to 80%). Right vertebral artery hypoplastic and divides at the takeoff of the right posterior communicating artery. Scattered atheromatous irregularity within the right V4 segment without flow-limiting stenosis. Right PICA patent. Extensive atheromatous irregularity throughout the basilar artery which is diffusely irregular. Probable superimposed penetrating plaque at the posterior aspect of the distal basilar artery (series 7, image 120). There is associated narrowing of the basilar artery of up to approximately 50- 75%, greatest proximally at the vertebrobasilar junction. Basilar tip mildly ectatic and widely patent. Superior cerebral arteries patent bilaterally. Left SCA arises from the left P1 segment. Both of the posterior cerebral artery supplied primarily via the basilar. Normal takeoff of the left P1 segment accounts for previously questioned abnormality on prior CT. PCAs are patent to their distal aspects. Distal small vessel atheromatous irregularity noted within the PCAs bilaterally. Venous sinuses: Patent. Right transverse and sigmoid sinuses are hypoplastic. Anatomic variants: No significant anatomic variant. No aneurysm or vascular malformation. Delayed phase: No pathologic enhancement. Review of the MIP images confirms the above findings IMPRESSION: CTA NECK IMPRESSION: 1. Negative CTA of the  neck. No hemodynamically significant or critical stenosis. 2. Atheromatous plaque  about the left carotid bifurcation without significant stenosis. No significant atheromatous disease within the right carotid artery system. 3. Widely patent vertebral arteries within the neck. CTA HEAD IMPRESSION: 1. Negative CTA for large or proximal arterial branch occlusion. 2. 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 3. Previously questioned abnormality at the basilar tip is a normal vascular infundibulum. No aneurysm identified. 4. Moderate atheromatous irregularity throughout the anterior circulation without flow-limiting or correctable stenosis. Electronically Signed   By: Rise Mu M.D.   On: 04/26/2017 04:37   Ct Head Wo Contrast  Result Date: 04/25/2017 CLINICAL DATA:  Dizziness today. History of hypertension, atrial fibrillation, long-term anti coagulation. EXAM: CT HEAD WITHOUT CONTRAST TECHNIQUE: Contiguous axial images were obtained from the base of the skull through the vertex without intravenous contrast. COMPARISON:  None. FINDINGS: BRAIN: No intraparenchymal hemorrhage, mass effect nor midline shift. The ventricles and sulci are normal for age. Patchy to confluent supratentorial white matter hypodensities. No acute large vascular territory infarcts. No abnormal extra-axial fluid collections. Basal cisterns are patent. VASCULAR: Dense dolicoectatic intracranial vessels. Moderate carotid siphon and severe vertebrobasilar calcific atherosclerosis. Two wide-necked outpouchings basilar tip measuring to 4 mm. SKULL: No skull fracture. Multiple calvarial presumed of vascular lakes. No significant scalp soft tissue swelling.  SINUSES/ORBITS: The mastoid air-cells and included paranasal sinuses are well-aerated.The included ocular globes and orbital contents are non-suspicious. Status post bilateral ocular lens implants. OTHER: Multiple absent teeth. IMPRESSION: Dense dolichoectatic intracranial vessels compatible with hemoconcentration and chronic hypertension. Superimposed basilar tip infundibuli versus aneurysm. Severe vertebrobasilar calcific atherosclerosis. Recommend CT angiography for further characterization. Moderate to severe chronic small vessel ischemic disease. Electronically Signed   By: Awilda Metro M.D.   On: 04/25/2017 16:31   Ct Angio Neck W And/or Wo Contrast  Result Date: 04/26/2017 CLINICAL DATA:  Initial evaluation for acute dizziness. EXAM: CT ANGIOGRAPHY HEAD AND NECK TECHNIQUE: Multidetector CT imaging of the head and neck was performed using the standard protocol during bolus administration of intravenous contrast. Multiplanar CT image reconstructions and MIPs were obtained to evaluate the vascular anatomy. Carotid stenosis measurements (when applicable) are obtained utilizing NASCET criteria, using the distal internal carotid diameter as the denominator. CONTRAST:  50 cc of Isovue 370. COMPARISON:  Prior CT from 04/25/2017. FINDINGS: CTA NECK FINDINGS Aortic arch: Visualized aortic arch of normal caliber with normal branch pattern. No high-grade stenosis about the origin of the great vessels. Sequelae of prior CABG noted. Visualized subclavian arteries widely patent. Right carotid system: Right common and internal carotid artery is widely patent without flow-limiting stenosis or other acute vascular abnormality. No significant atheromatous plaque about the right carotid bifurcation. Left carotid system: Left common carotid artery patent from its origin to the bifurcation without stenosis. Eccentric atheromatous plaque about the left bifurcation without hemodynamically significant stenosis. Left ICA  widely patent distally to the skullbase without flow-limiting stenosis. Vertebral arteries: Both of the vertebral arteries arise from the subclavian arteries. Left vertebral artery dominant. Minimal plaque at the origin left vertebral artery without significant stenosis. Left vertebral artery is patent distally without stenosis or other acute vascular abnormality. The the Skeleton: No acute osseous abnormality. No worrisome lytic or blastic osseous lesions. Median sternotomy noted. Mild-to-moderate degenerative spondylolysis within the cervical spine, greatest at C5-6 and C6-7. Other neck: No  acute soft tissue abnormality within the neck. No adenopathy. Thyroid normal. Upper chest: Visualized upper chest within normal limits. Visualized lungs are clear. Few subcentimeter partially calcified granulomas noted. Review of the MIP images confirms the above findings CTA HEAD FINDINGS Anterior circulation: Petrous segments widely patent bilaterally. Scattered calcified atheromatous plaque within the cavernous/ supraclinoid ICAs without flow-limiting stenosis. ICA termini widely patent right A1 segment dominant and widely patent. Left A1 segment hypoplastic. Short-segment mild stenosis at the proximal left A1 segment noted. Anterior communicating artery normal. Anterior cerebral arteries patent to their distal aspects. M1 segments demonstrate mild atheromatous irregularity without high-grade flow-limiting stenosis. MCA bifurcations normal. No proximal M2 occlusion. Distal MCA branches well opacified and symmetric. Distal small vessel atheromatous irregularity seen within the MCA branches bilaterally. Posterior circulation: Left vertebral artery dominant. Multifocal atheromatous plaque throughout the left V4 segment with moderate to severe stenosis (up to 80%). Right vertebral artery hypoplastic and divides at the takeoff of the right posterior communicating artery. Scattered atheromatous irregularity within the right V4  segment without flow-limiting stenosis. Right PICA patent. Extensive atheromatous irregularity throughout the basilar artery which is diffusely irregular. Probable superimposed penetrating plaque at the posterior aspect of the distal basilar artery (series 7, image 120). There is associated narrowing of the basilar artery of up to approximately 50- 75%, greatest proximally at the vertebrobasilar junction. Basilar tip mildly ectatic and widely patent. Superior cerebral arteries patent bilaterally. Left SCA arises from the left P1 segment. Both of the posterior cerebral artery supplied primarily via the basilar. Normal takeoff of the left P1 segment accounts for previously questioned abnormality on prior CT. PCAs are patent to their distal aspects. Distal small vessel atheromatous irregularity noted within the PCAs bilaterally. Venous sinuses: Patent. Right transverse and sigmoid sinuses are hypoplastic. Anatomic variants: No significant anatomic variant. No aneurysm or vascular malformation. Delayed phase: No pathologic enhancement. Review of the MIP images confirms the above findings IMPRESSION: CTA NECK IMPRESSION: 1. Negative CTA of the neck. No hemodynamically significant or critical stenosis. 2. Atheromatous plaque about the left carotid bifurcation without significant stenosis. No significant atheromatous disease within the right carotid artery system. 3. Widely patent vertebral arteries within the neck. CTA HEAD IMPRESSION: 1. Negative CTA for large or proximal arterial branch occlusion. 2. 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 3. Previously questioned abnormality at the basilar tip is a normal vascular infundibulum. No  aneurysm identified. 4. Moderate atheromatous irregularity throughout the anterior circulation without flow-limiting or correctable stenosis. Electronically Signed   By: Rise Mu M.D.   On: 04/26/2017 04:37     Labs:   Basic Metabolic Panel:  Recent Labs Lab 04/25/17 1607 04/26/17 1209  NA 141  --   K 3.3*  --   CL 103  --   CO2 28  --   GLUCOSE 142*  --   BUN 19  --   CREATININE 1.20  --   CALCIUM 8.9  --   MG  --  2.0   GFR Estimated Creatinine Clearance: 87.7 mL/min (by C-G formula based on SCr of 1.2 mg/dL). Liver Function Tests:  Recent Labs Lab 04/25/17 2227  AST 22  ALT 19  ALKPHOS 60  BILITOT 0.9  PROT 6.5  ALBUMIN 3.5   No results for input(s): LIPASE, AMYLASE  in the last 168 hours. No results for input(s): AMMONIA in the last 168 hours. Coagulation profile  Recent Labs Lab 04/25/17 2227  INR 1.09    CBC:  Recent Labs Lab 04/25/17 1607 04/25/17 2227  WBC 8.0  --   NEUTROABS  --  4.7  HGB 14.9  --   HCT 44.9  --   MCV 89.6  --   PLT 213  --    Cardiac Enzymes: No results for input(s): CKTOTAL, CKMB, CKMBINDEX, TROPONINI in the last 168 hours. BNP: Invalid input(s): POCBNP CBG: No results for input(s): GLUCAP in the last 168 hours. D-Dimer No results for input(s): DDIMER in the last 72 hours. Hgb A1c  Recent Labs  04/26/17 1209  HGBA1C 6.1*   Lipid Profile  Recent Labs  04/26/17 1209  CHOL 197  HDL 41  LDLCALC 109*  TRIG 236*  CHOLHDL 4.8   Thyroid function studies No results for input(s): TSH, T4TOTAL, T3FREE, THYROIDAB in the last 72 hours.  Invalid input(s): FREET3 Anemia work up No results for input(s): VITAMINB12, FOLATE, FERRITIN, TIBC, IRON, RETICCTPCT in the last 72 hours. Microbiology No results found for this or any previous visit (from the past 240 hour(s)).   Discharge Instructions:   Discharge Instructions    AMB Referral to [facility] Care Management    Complete by:  As directed    Reason for  consult:  Restart services for Transition of care follow up   Expected date of contact:  1-3 days (reserved for hospital discharges)   Restart services:   Please assign to community nurse for transition of care calls and assess for home visits. Questions please call:   Charlesetta Shanks, RN BSN CCM [facility]  [phone] business mobile phone Toll free office [phone]   Diet - low sodium heart healthy    Complete by:  As directed    Discharge instructions    Complete by:  As directed    Home health   Increase activity slowly    Complete by:  As directed      Allergies as of 04/27/2017      Reactions   Niacin And Related Other (See Comments)   FLUSHING   Penicillins Itching, Rash   Has patient had a PCN reaction causing immediate rash, facial/tongue/throat swelling, SOB or lightheadedness with hypotension:YES Has patient had a PCN reaction causing severe rash involving mucus membranes or skin necrosis: NO Has patient had a PCN reaction that required hospitalization NO Has patient had a PCN reaction occurring within the last 10 years: NO If all of the above answers are "NO", then may proceed with Cephalosporin use.      Medication List    STOP taking these medications   aspirin 81 MG EC tablet   pravastatin 40 MG tablet Commonly known as:  PRAVACHOL     TAKE these medications   acetaminophen 500 MG tablet Commonly known as:  TYLENOL Take 1,000 mg by mouth every 6 (six) hours as needed for moderate pain. Reported on 03/11/2016   allopurinol 100 MG tablet Commonly known as:  ZYLOPRIM TAKE 1 TABLET(100 MG) BY MOUTH DAILY   amiodarone 200 MG tablet Commonly known as:  PACERONE TAKE 1 TABLET(200 MG) BY MOUTH DAILY   atorvastatin 40 MG tablet Commonly known as:  LIPITOR Take 1 tablet (40 mg total) by mouth daily at 6 PM.   carvedilol 6.25 MG tablet Commonly known as:  COREG TAKE 1 TABLET(6.25 MG) BY MOUTH TWICE DAILY  WITH A MEAL   fluticasone  50 MCG/ACT nasal spray Commonly known as:  FLONASE Place 1 spray into both nostrils daily.   furosemide 80 MG tablet Commonly known as:  LASIX Take 80 mg by mouth 2 (two) times daily.   losartan 25 MG tablet Commonly known as:  COZAAR Take 1 tablet (25 mg total) by mouth daily.   potassium chloride SA 20 MEQ tablet Commonly known as:  K-DUR,KLOR-CON Take 2 tablets (40 mEq total) by mouth daily.   PROAIR HFA 108 (90 Base) MCG/ACT inhaler Generic drug:  albuterol INHALE 1 PUFF INTO THE LUNGS EVERY 6 HOURS AS NEEDED FOR WHEEZING OR SHORTNESS OF BREATH   rivaroxaban 20 MG Tabs tablet Commonly known as:  XARELTO Take 1 tablet (20 mg total) by mouth daily with supper. Start taking on:  04/28/2017   VITAMIN D PO Take 1 tablet by mouth daily.      Follow-up Information    [facility].   Why:  They will contact you for the first visit. Contact information: [address] [place] [address] [phone]        [name], MD Follow up in 1 week(s).   Specialty:  Internal Medicine Contact information: [address] [place] [address] [phone]        Orpah Cobb, MD Follow up in 2 week(s).   Specialty:  Cardiology Contact information: [address] [place] [address] [phone]            Time coordinating discharge: 25 min  Signed:  Salam Micucci Juanetta Gosling   [facility] 04/27/2017, 2:49 PM

## 2017-04-27 NOTE — Care Management Note (Signed)
Case Management Note  Patient Details  Name: Charles Mckinney MRN: [id number] Date of Birth: 01-03-1951  Subjective/Objective:                    Action/Plan: CM consulted for benefits check for [facility] and [facility] services. CM met with the patient and he uses his [facility] for his medications. He does not have a PCP through the [facility] any longer that can prescribe medications. CM did benefits check on [facility] and it will cost him $45/ month. Pt states he can not afford this. Charles Mckinney made aware.  Pt with orders for [facility] services. CM provided him a list of [facility] agencies. He selected [facility]. Charles Mckinney with [facility] notified and accepted the referral.  CM following.  Expected Discharge Date:                  Expected Discharge Plan:  [facility]ck[facility]  In-House Referral:     Discharge planning Services  CM Consult  Post Acute Care Choice:  Home Health Choice offered to:  Patient  DME Arranged:    DME Agency:     HH Arranged:  PT, OT HH Agency:  [place]  Status of Service:  Completed, signed off  If discussed at [place] of Stay Meetings, dates discussed:    Additional Comments:  Charles Friar, Charles Mckinney 04/27/2017, 2:01 PM

## 2017-04-27 NOTE — Care Management Obs Status (Signed)
MEDICARE OBSERVATION STATUS NOTIFICATION   Patient Details  Name: Charles Mckinney MRN: [id number] Date of Birth: [birth year]   Medicare Observation Status Notification Given:  Yes    Kermit Balo, RN 04/27/2017, 12:12 PM

## 2017-04-27 NOTE — Evaluation (Signed)
Occupational Therapy Evaluation Patient Details Name: Charles Mckinney MRN: [id number] DOB: 1950-12-14 Today's Date: 04/27/2017    History of Present Illness Pt is a 66 y/o male who presents with episode of lightheadedness and dizziness. Questionable CVA however unable to have MRI per MD note. PMH significant for SOB, OA, obesity, hypertensive heart disease, CHF.   Clinical Impression   This 66 y/o M presents with the above. Pt lives alone, and reports at baseline he is independent with functional mobility (ModI for longer distances) and independent with ADLs. Pt currently requires MinGuard assist for functional mobility and MinGuard assist for ADLs. Denies any dizziness during this session. Monitored BP during session with readings 122/71 in sitting, 110/66 after brief room level functional mobility to sink and recliner, and 121/72 after sitting in recliner approx 3 min. Pt will benefit from continued OT services to maximize safety and independence with ADLs and functional mobility after return home. Will follow.     Follow Up Recommendations  Home health OT;Supervision - Intermittent    Equipment Recommendations  Tub/shower bench (would benefit from bariatric shower bench )           Precautions / Restrictions Precautions Precautions: Fall Precaution Comments: Pt reports he wears a R knee brace at baseline for functional mobility, does not currently have it with him  Restrictions Weight Bearing Restrictions: No      Mobility Bed Mobility Overal bed mobility: Needs Assistance Bed Mobility: Supine to Sit     Supine to sit: Supervision;HOB elevated     General bed mobility comments: supervision for safety   Transfers Overall transfer level: Modified independent Equipment used: None Transfers: Sit to/from Stand Sit to Stand: Supervision         General transfer comment: supervision for safety, rose to standing without difficulty, no unsteadiness noted     Balance  Overall balance assessment: No apparent balance deficits (not formally assessed)                                         ADL either performed or assessed with clinical judgement   ADL Overall ADL's : Needs assistance/impaired Eating/Feeding: Independent;Sitting   Grooming: Oral care;Min guard;Standing   Upper Body Bathing: Set up;Sitting   Lower Body Bathing: Min guard;Sit to/from stand   Upper Body Dressing : Set up;Sitting   Lower Body Dressing: Min guard;Sit to/from stand Lower Body Dressing Details (indicate cue type and reason): demonstrates able to complete figure 4 technique to doff/don socks  Toilet Transfer: Min guard;Ambulation;Regular Social worker and Hygiene: Min guard;Sit to/from Nurse, children's Details (indicate cue type and reason): Pt reports difficulty stepping over tub, educated Pt on use of tub bench to increase safety during tub transfer  Functional mobility during ADLs: Min guard General ADL Comments: Monitored BP during session, BP 122/71 sitting, 110/66 after brief room level functional mobility to sink and chair, returned to 121/72 after sitting approx 3 min (communicated vitals to RN)      Vision Baseline Vision/History: Wears glasses Patient Visual Report: No change from baseline Additional Comments: Pt with visual deficits at baseline (blurring of central vision) with greater deficits in L eye; Pt occular movements, ROM, and periphery appear [facility]                 Pertinent Vitals/Pain Pain Assessment: Faces Faces Pain Scale:  No hurt     Hand Dominance     Extremity/Trunk Assessment Upper Extremity Assessment Upper Extremity Assessment: Overall WFL for tasks assessed (sensation intact bil UEs )   Lower Extremity Assessment Lower Extremity Assessment: Defer to PT evaluation       Communication Communication Communication: No difficulties   Cognition   Behavior During Therapy:  WFL for tasks assessed/performed Overall Cognitive Status: Within Functional Limits for tasks assessed                                     General Comments                  Home Living Family/patient expects to be discharged to:: Private residence Living Arrangements: Alone Available Help at Discharge: Family;Available PRN/intermittently Type of Home: House Home Access: Ramped entrance     Home Layout: One level     Bathroom Shower/Tub: Chief Strategy Officer: Standard Bathroom Accessibility: Yes   Home Equipment: Cane - single point;Walker - 2 wheels          Prior Functioning/Environment Level of Independence: Independent        Comments: Mostly managing without AD or assist, reports he uses a SPC for longer distance ambulation, pt reports was sponge bathing as he was not able to enter his tub/shower due to stepping over high side. Pt reports he still drives         OT Problem List: Decreased activity tolerance;Obesity;Impaired vision/perception      OT Treatment/Interventions: Self-care/ADL training;Therapeutic activities;Therapeutic exercise;Energy conservation;Patient/family education;Visual/perceptual remediation/compensation    OT Goals(Current goals can be found in the care plan section) Acute Rehab OT Goals Patient Stated Goal: Return home today OT Goal Formulation: With patient Time For Goal Achievement: 05/11/17 ADL Goals Pt Will Perform Grooming: with modified independence;standing Pt Will Perform Lower Body Dressing: with modified independence;sit to/from stand Pt Will Transfer to Toilet: with modified independence;ambulating;regular height toilet Pt Will Perform Tub/Shower Transfer: with modified independence;Tub transfer;tub bench  OT Frequency: Min 2X/week                             AM-PAC PT "6 Clicks" Daily Activity     Outcome Measure Help from another person eating meals?: None Help from another  person taking care of personal grooming?: A Little Help from another person toileting, which includes using toliet, bedpan, or urinal?: A Little Help from another person bathing (including washing, rinsing, drying)?: A Little Help from another person to put on and taking off regular upper body clothing?: None Help from another person to put on and taking off regular lower body clothing?: A Little 6 Click Score: 20   End of Session Equipment Utilized During Treatment: Gait belt Nurse Communication: Other (comment) (BP readings)  Activity Tolerance: Patient tolerated treatment well Patient left: in chair;with call bell/phone within reach;with chair alarm set  OT Visit Diagnosis: Other symptoms and signs involving the nervous system (R29.898)                Time: 8546-2703 OT Time Calculation (min): 25 min Charges:  OT General Charges $OT Visit: 1 Procedure OT Evaluation $OT Eval Low Complexity: 1 Procedure G-Codes: OT G-codes **NOT FOR INPATIENT CLASS** Functional Assessment Tool Used: AM-PAC 6 Clicks Daily Activity;Clinical judgement Functional Limitation: Self care Self Care Current Status (J0093): At least 20 percent  but less than 40 percent impaired, limited or restricted Self Care Goal Status (V7846): At least 1 percent but less than 20 percent impaired, limited or restricted   Marcy Siren, OT Pager [phone] 04/27/2017   Orlando Penner 04/27/2017, 10:02 AM

## 2017-04-28 ENCOUNTER — Other Ambulatory Visit: Payer: Self-pay | Admitting: *Deleted

## 2017-04-28 ENCOUNTER — Telehealth: Payer: Self-pay

## 2017-04-28 ENCOUNTER — Encounter: Payer: Self-pay | Admitting: *Deleted

## 2017-04-28 NOTE — Telephone Encounter (Signed)
LMTCB

## 2017-04-28 NOTE — Patient Outreach (Signed)
[facility] Customer service manager [facility]) Care Management [facility] Community CM Telephone Outreach, Transition of Care day 1  04/28/2017  ZAKERY NORMINGTON Jun 06, 1951 [id number]  Successful telephone outreach to [name] y.o.malepreviously active with [facility] CM for transition of care after hospitalization.  Patient was re-referred to [facility] for transition of care after recent hospitalization May 22-24, 2018 for TIA/ gait imbalance, and was discharged home with home health services through [facility] for PT/ OT/ and possibly nursing.  Patient has history including, but not limited to, CHF, CAD with CABG x 4 in 2016, paroxysmal Atrial fibrillation, HTN, HLD, and morbid obesity. HIPAA/ identity verified with patient during phone call today, and patient provided verbal consent for [facility] CM services which he is familiar with after previous hospital visits; verified [facility] CM written consent from 12/15/16 in EMR.  Today, Jamale reports that he "is doing pretty good" since his hospital discharge yesterday, and he denies problems, pain, and concerns.  Patient sounds to be in no obvious distress throughout today's 30 minute phone call:  Patient reports:  -- has discharge instructions, but can't read easily, due to recent (new) "eye issue," which he says limits his ability to see written materials; reports that he can still drive his own car and has no problems seeing to drive in daytime hours.  All discharge instructions were reviewed with patient today during phone call, patient verbalizes a fair understanding of discharge instructions.  -- Has all medications and is taking as prescribed, except for newly recommended Atorvastatin (lipitor): patient states "I am not throwing this pravastatin away-- that's like throwing money away.... I'll finish my pravastatin, and then I'll take the lipitor.... I don't know why they even changed that medicine at the hospital."  Patient was advised to follow all medication discharge  instructions, however, patient is adamant that he will not start taking Lipitor until he has finished the pravastatin that is present in his home.  Patient confirms that he has stopped ASA and is currently taking xarelto.  Patient was recently discharged from hospital and all medications were reviewed during phone call today.  -- has heard from home health [facility]) agency, who called today to set up initial appointment for Monday; patient declined appointment due to [facility]; states he expects to hear back from the [facility] nurse "next week" for follow up.  Patient states he has the number for [facility] agency ([facility]), but the number he provides today is different form the number on discharge instructions; I offered to provide the number to patient from discharge instructions, but patient declines taking number, stating that he "will just wait to hear from them."  -- [facility] [place] [facility]; states he will drive himself to appointment.  Denies that he has an appointment with PCP or with neurologist and cardiologist; patient states "everything is closed due to [facility] day.  I will make those appointment next week if I can."  Importance of making prompt follow up appointments with providers discussed with patient.  -- [employer] needs:  Denies [employer] needs; states that he drives himself to all provider appointments "for the most part," however, his local sons assist him with needs on a very limited basis. Patient also states that he gets some assistance with his needs form "his kid's Mama" i.e, "my first wife," whom he called "Sedeelia/ Dee."    -- Advanced Directives:  Patient has still not completed AD planning, despite previous  education provided through [facility] CM services.  Patient states he "is still thinking about it."  States that he was again provided information during most recent hospitalization.  Patient denies further issues,  concerns, or problems today.  I confirmed that patient has my direct phone number, the main [facility] CM office phone number, and the [facility] CM 24-hour nurse advice phone number should issues arise prior to next scheduled [facility] Community CM outreach by phone next week.  Plan:  Patient will take medications as prescribed and will schedule and attend provider appointments as recommended post-hospital discharge.  Patient will actively participate in home health [facility]) services as ordered post-hospital discharge.  Patient will promptly notify his providers for any new concerns, problems, or issues that arise.  I will make patient's PCP aware of [facility] CM involvement in patient's care post-hospital discharge.  [facility] Community CM outreach for ongoing transition of care to continue with scheduled phone call next week.  [name], RN, BSN, [employer] [facility] Care Management  [phone]

## 2017-05-01 DIAGNOSIS — I4581 Long QT syndrome: Secondary | ICD-10-CM | POA: Diagnosis not present

## 2017-05-01 DIAGNOSIS — I5022 Chronic systolic (congestive) heart failure: Secondary | ICD-10-CM | POA: Diagnosis not present

## 2017-05-01 DIAGNOSIS — Z7951 Long term (current) use of inhaled steroids: Secondary | ICD-10-CM | POA: Diagnosis not present

## 2017-05-01 DIAGNOSIS — I2510 Atherosclerotic heart disease of native coronary artery without angina pectoris: Secondary | ICD-10-CM | POA: Diagnosis not present

## 2017-05-01 DIAGNOSIS — Z8673 Personal history of transient ischemic attack (TIA), and cerebral infarction without residual deficits: Secondary | ICD-10-CM | POA: Diagnosis not present

## 2017-05-01 DIAGNOSIS — I110 Hypertensive heart disease with heart failure: Secondary | ICD-10-CM | POA: Diagnosis not present

## 2017-05-01 DIAGNOSIS — I480 Paroxysmal atrial fibrillation: Secondary | ICD-10-CM | POA: Diagnosis not present

## 2017-05-01 DIAGNOSIS — Z7901 Long term (current) use of anticoagulants: Secondary | ICD-10-CM | POA: Diagnosis not present

## 2017-05-01 DIAGNOSIS — E876 Hypokalemia: Secondary | ICD-10-CM | POA: Diagnosis not present

## 2017-05-02 ENCOUNTER — Encounter: Payer: Self-pay | Admitting: *Deleted

## 2017-05-02 DIAGNOSIS — I480 Paroxysmal atrial fibrillation: Secondary | ICD-10-CM | POA: Diagnosis not present

## 2017-05-02 DIAGNOSIS — R0602 Shortness of breath: Secondary | ICD-10-CM | POA: Diagnosis not present

## 2017-05-02 DIAGNOSIS — E876 Hypokalemia: Secondary | ICD-10-CM | POA: Diagnosis not present

## 2017-05-02 DIAGNOSIS — I2510 Atherosclerotic heart disease of native coronary artery without angina pectoris: Secondary | ICD-10-CM | POA: Diagnosis not present

## 2017-05-02 DIAGNOSIS — Z7901 Long term (current) use of anticoagulants: Secondary | ICD-10-CM | POA: Diagnosis not present

## 2017-05-02 DIAGNOSIS — I110 Hypertensive heart disease with heart failure: Secondary | ICD-10-CM | POA: Diagnosis not present

## 2017-05-02 DIAGNOSIS — Z7951 Long term (current) use of inhaled steroids: Secondary | ICD-10-CM | POA: Diagnosis not present

## 2017-05-02 DIAGNOSIS — I5022 Chronic systolic (congestive) heart failure: Secondary | ICD-10-CM | POA: Diagnosis not present

## 2017-05-02 DIAGNOSIS — K921 Melena: Secondary | ICD-10-CM | POA: Diagnosis not present

## 2017-05-02 DIAGNOSIS — I482 Chronic atrial fibrillation: Secondary | ICD-10-CM | POA: Diagnosis not present

## 2017-05-02 DIAGNOSIS — Z8673 Personal history of transient ischemic attack (TIA), and cerebral infarction without residual deficits: Secondary | ICD-10-CM | POA: Diagnosis not present

## 2017-05-02 DIAGNOSIS — R072 Precordial pain: Secondary | ICD-10-CM | POA: Diagnosis not present

## 2017-05-02 DIAGNOSIS — I4581 Long QT syndrome: Secondary | ICD-10-CM | POA: Diagnosis not present

## 2017-05-02 NOTE — Telephone Encounter (Signed)
LMTCB

## 2017-05-03 ENCOUNTER — Encounter: Payer: Self-pay | Admitting: *Deleted

## 2017-05-03 ENCOUNTER — Other Ambulatory Visit: Payer: Self-pay | Admitting: *Deleted

## 2017-05-03 DIAGNOSIS — H353211 Exudative age-related macular degeneration, right eye, with active choroidal neovascularization: Secondary | ICD-10-CM | POA: Diagnosis not present

## 2017-05-03 NOTE — Telephone Encounter (Signed)
Charles Mckinney pt returned your call °

## 2017-05-03 NOTE — Telephone Encounter (Signed)
LMTCB

## 2017-05-03 NOTE — Patient Outreach (Signed)
[facility] Customer service manager [facility]) Care Management [facility] Community CM Telephone Outreach, Transition of Care day 6  05/03/2017  LUDDIE HEBELER August 14, 1951 [id number]  Unsuccessful telephone outreach to [name] y.o.malepreviously active with [facility] CM for transition of care after hospitalization.  Patient was re-referred to [facility] for transition of care after recent hospitalization May 22-24, 2018 for TIA/ gait imbalance, and was discharged home with home health services through [facility] for PT/ OT/ and possibly nursing.  Patient has history including, but not limited to, CHF, CAD with CABG x 4 in 2016, paroxysmal Atrial fibrillation, HTN, HLD, and morbid obesity.   HIPAA compliant voice mail message left for patient, requesting return call back.  Plan:  Will re-attempt [facility] Community CM telephone outreach again tomorrow if I do not hear back from patient first.  [name], RN, BSN, CCRN [employer] Coordinator [facility] Care Management  [phone]

## 2017-05-04 ENCOUNTER — Telehealth: Payer: Self-pay | Admitting: Family Medicine

## 2017-05-04 ENCOUNTER — Encounter: Payer: Self-pay | Admitting: *Deleted

## 2017-05-04 ENCOUNTER — Encounter: Payer: Self-pay | Admitting: Family Medicine

## 2017-05-04 ENCOUNTER — Other Ambulatory Visit: Payer: Self-pay | Admitting: *Deleted

## 2017-05-04 NOTE — Patient Outreach (Signed)
[facility] Customer service manager [facility]) Care Management [facility] Community CM Telephone Outreach, Transition of Care day 7  05/04/2017  Charles Mckinney 06/07/1951 [id number]  Unsuccessful telephone outreach to [name] y.o.malepreviously active with [facility] CM for transition of care after hospitalization.  Patient was re-referred to [facility] for transition of care after recent hospitalization May 22-24, 2018 for TIA/ gait imbalance, and was discharged home with home health services through [facility] for PT/ OT/ and possibly nursing.  Patient has history including, but not limited to, CHF, CAD with CABG x 4 in 2016, paroxysmal Atrial fibrillation, HTN, HLD, and morbid obesity.   HIPAA compliant voice mail message left for patient, requesting return call back.  Plan:  Will re-attempt [facility] Community CM telephone outreach for ongoing transition of care next week if I do not hear back from patient first.  [name], RN, BSN, CCRN [employer] [facility] Care Management  [phone]

## 2017-05-04 NOTE — Telephone Encounter (Signed)
LMTCB

## 2017-05-04 NOTE — Telephone Encounter (Signed)
Pt had a colorectal screen through his insurance company.  Stool test came back positive.  Please send any instructions to your nurse/assistant to follow up.  I have placed the result in your basket.  Thanks!!

## 2017-05-04 NOTE — Telephone Encounter (Signed)
D/C  04/27/17 To: home  Spoke with pt and he states that he is doing well. He has started Eliquis samples from cardiology and is going to [place] tomorrow for appt and prescription as he cannot afford out-of-pocket. He denies any continued dizziness or other s/s of stroke. He has no questions or concerns at this time.   Appt scheduled 05/08/17 with Amil Amen as Dr. Kirtland Bouchard is not available, pt aware.   Transition Care Management Follow-up Telephone Call  How have you been since you were released from the hospital? Very good   Do you understand why you were in the hospital? yes   Do you understand the discharge instrcutions? yes  Items Reviewed:  Medications reviewed: yes  Allergies reviewed: yes  Dietary changes reviewed: yes  Referrals reviewed: yes   Functional Questionnaire:   Activities of Daily Living (ADLs):   He states they are independent in the following: ambulation, bathing and hygiene, feeding, continence, grooming, toileting and dressing States they require assistance with the following: none   Any transportation issues/concerns?: no   Any patient concerns? no   Confirmed importance and date/time of follow-up visits scheduled: yes   Confirmed with patient if condition begins to worsen call PCP or go to the ER.  Patient was given the Call-a-Nurse line [phone]: yes

## 2017-05-05 HISTORY — PX: NM MYOVIEW LTD: HXRAD82

## 2017-05-05 NOTE — Telephone Encounter (Signed)
Please find out what kind of colorectal screen.  This refers to and get documentation

## 2017-05-08 ENCOUNTER — Encounter: Payer: Self-pay | Admitting: Family Medicine

## 2017-05-08 ENCOUNTER — Other Ambulatory Visit: Payer: Self-pay | Admitting: *Deleted

## 2017-05-08 ENCOUNTER — Ambulatory Visit (INDEPENDENT_AMBULATORY_CARE_PROVIDER_SITE_OTHER): Payer: Medicare Other | Admitting: Family Medicine

## 2017-05-08 ENCOUNTER — Encounter: Payer: Self-pay | Admitting: *Deleted

## 2017-05-08 VITALS — BP 128/72 | HR 80 | Temp 98.5°F | Wt 324.8 lb

## 2017-05-08 DIAGNOSIS — I5022 Chronic systolic (congestive) heart failure: Secondary | ICD-10-CM

## 2017-05-08 DIAGNOSIS — I119 Hypertensive heart disease without heart failure: Secondary | ICD-10-CM

## 2017-05-08 DIAGNOSIS — Z8673 Personal history of transient ischemic attack (TIA), and cerebral infarction without residual deficits: Secondary | ICD-10-CM | POA: Diagnosis not present

## 2017-05-08 DIAGNOSIS — E876 Hypokalemia: Secondary | ICD-10-CM

## 2017-05-08 DIAGNOSIS — I480 Paroxysmal atrial fibrillation: Secondary | ICD-10-CM

## 2017-05-08 DIAGNOSIS — R42 Dizziness and giddiness: Secondary | ICD-10-CM

## 2017-05-08 LAB — BASIC METABOLIC PANEL
BUN: 15 mg/dL (ref 6–23)
CALCIUM: 9.2 mg/dL (ref 8.4–10.5)
CO2: 27 mEq/L (ref 19–32)
CREATININE: 1.14 mg/dL (ref 0.40–1.50)
Chloride: 106 mEq/L (ref 96–112)
GFR: 68.15 mL/min (ref >60.00)
GLUCOSE: 99 mg/dL (ref 70–99)
Potassium: 3.8 mEq/L (ref 3.5–5.1)
SODIUM: 143 meq/L (ref 135–145)

## 2017-05-08 NOTE — Progress Notes (Signed)
Subjective:    Patient ID: Charles Mckinney, male    DOB: 05/18/1951, 66 y.o.   MRN: [id number]  HPI  [name] is a 66 year old male who is here today for a hospital follow up. I am seeing him in place of his PCP who is unavailable.  Admit date:  04/25/2017 Discharge date:  04/27/2017  Patient with a PMH significant for CHF EF 30%, Afib who was off of Xarelto, CAD s/p CABG in 2016, HTN presented to the ED with dizziness for 1 day. He reported profuse diarrhea then one hour later felt like he was going to "pass out."  Lightheadedness reported but no vertigo. He denied palpitations, chest pain, dyspnea, and reports sitting forward and shaking his head which improved his symptoms.  He reported staggering and feeling off balance which is why he sought care in the ED.  He was evaluated for dizziness with suspected TIA vs. CVA.  Patient reported that eliquis wa too expensive so he will go back on xarelto at that time.  Echo indicated mild to moderate global reduction in LV systolic function; mild LVH; mildly dilated aortic root; mild MR; moderate LAE; trace TR with mildly elevated pulmonary pressure. He has been referred to neurology and also a follow up with cardiology on 05/11/2017.  A PT/OT home consult was ordered.    Today, he reports feeling well and he has been making dietary changes to decrease fatty foods. He states that his episode of diarrhea associated with ED visit may have been associated with dietary choices and triggered episode of dizziness.  He reports that his BMs have returned to normal and he has dulcolax that provides benefit if needed and he may use this no more than once or twice weekly.  He has not able to afford a home sleep study and he has declined this at this time.  He states that benefits are available through the [place] [facility] [place] however he does not have transportation for this evaluation. He also reports a history of CPAP use that he did not like to use and has no plans to  initiate this therapy in the future. He is currently being seen in his home by [facility] by nursing and PT. He has also been contacted for an evaluation by [facility] which is scheduled for 05/17/17.  He reports seeing his cardiologist last week who advised him to start Eliquis for two weeks after stopping Xarelto for one week due to bleeding in his stool that occurred. He started Eliquis today and will takes this for 2 weeks and then follow up with cardiology for further evaluation and management regarding [facility] therapy.  He denies any blood in stool or other unusual bruising or bleeding today. He denies chest pain, palpitations, SOB, numbness, tingling, weakness, headaches, or edema.   Review of Systems  Constitutional: Negative for chills, fatigue and fever.  Respiratory: Negative for cough, shortness of breath and wheezing.   Cardiovascular: Negative for chest pain and palpitations.  Gastrointestinal: Negative for abdominal pain, diarrhea, nausea and vomiting.  Genitourinary: Negative for dysuria.  Musculoskeletal: Negative for myalgias.  Skin: Negative for rash.  Neurological: Negative for dizziness, weakness, light-headedness, numbness and headaches.  Hematological: Does not bruise/bleed easily.  Psychiatric/Behavioral:       Denies depressed or anxious mood   Past Medical History:  Diagnosis Date  . Anxiety   . CHF (congestive heart failure) (HCC)   . Dysrhythmia    ATRIAL FIBRILATION, resolved with  cardioversion  . GERD (gastroesophageal reflux disease)   . Hyperlipidemia   . Hypertension   . Hypertensive heart disease   . Kidney stones   . Obesity (BMI 30-39.9)   . Osteoarthritis   . PONV (postoperative nausea and vomiting)   . Shortness of breath dyspnea      Social History   Social History  . Marital status: Widowed    Spouse name: N/A  . Number of children: N/A  . Years of education: N/A   Occupational History  . disabled    Social History Main Topics  .  Smoking status: Former Smoker    Quit date: 2000  . Smokeless tobacco: Never Used  . Alcohol use No  . Drug use: No  . Sexual activity: Yes   Other Topics Concern  . Not on file   Social History Narrative  . No narrative on file    Past Surgical History:  Procedure Laterality Date  . CARDIAC CATHETERIZATION N/A 04/09/2015   Procedure: Right/Left Heart Cath and Coronary Angiography;  Surgeon: Orpah Cobb, MD;  Location: [facility] INVASIVE CV LAB CUPID;  Service: Cardiovascular;  Laterality: N/A;  . CARDIOVERSION N/A 01/19/2015   Procedure: CARDIOVERSION;  Surgeon: Othella Boyer, MD;  Location: [facility] ENDOSCOPY;  Service: Cardiovascular;  Laterality: N/A;  pt in Afib, 12:22 synched cardioversion @  120 joules using Propofol 100 mg,IV....unsuccessful, repeated at 200 joules  . CARDIOVERSION N/A 04/02/2015   Procedure: CARDIOVERSION;  Surgeon: Orpah Cobb, MD;  Location: [facility] ENDOSCOPY;  Service: Cardiovascular;  Laterality: N/A;  . CATARACT EXTRACTION    . CLIPPING OF ATRIAL APPENDAGE  04/13/2015   Procedure: CLIPPING OF ATRIAL APPENDAGE;  Surgeon: Alleen Borne, MD;  Location: [facility] OR;  Service: Open Heart Surgery;;  . CORONARY ARTERY BYPASS GRAFT N/A 04/13/2015   Procedure: CORONARY ARTERY BYPASS GRAFTING (CABG);  Surgeon: Alleen Borne, MD;  Location: [facility] OR;  Service: Open Heart Surgery;  Laterality: N/A;  . TEE WITHOUT CARDIOVERSION N/A 04/13/2015   Procedure: TRANSESOPHAGEAL ECHOCARDIOGRAM (TEE);  Surgeon: Alleen Borne, MD;  Location: [facility] OR;  Service: Open Heart Surgery;  Laterality: N/A;    Family History  Problem Relation Age of Onset  . Diabetes Mother   . Heart disease Father   . Congestive Heart Failure Sister   . Stroke Brother   . Heart disease Brother   . Diabetes Brother     Allergies  Allergen Reactions  . Niacin And Related Other (See Comments)    FLUSHING  . Penicillins Itching and Rash    Has patient had a PCN reaction causing immediate rash, facial/tongue/throat  swelling, SOB or lightheadedness with hypotension:YES Has patient had a PCN reaction causing severe rash involving mucus membranes or skin necrosis: NO Has patient had a PCN reaction that required hospitalization NO Has patient had a PCN reaction occurring within the last 10 years: NO If all of the above answers are "NO", then may proceed with Cephalosporin use.    Current Outpatient Prescriptions on File Prior to Visit  Medication Sig Dispense Refill  . acetaminophen (TYLENOL) 500 MG tablet Take 1,000 mg by mouth every 6 (six) hours as needed for moderate pain. Reported on 03/11/2016    . allopurinol (ZYLOPRIM) 100 MG tablet TAKE 1 TABLET(100 MG) BY MOUTH DAILY 90 tablet 0  . amiodarone (PACERONE) 200 MG tablet TAKE 1 TABLET(200 MG) BY MOUTH DAILY 90 tablet 1  . atorvastatin (LIPITOR) 40 MG tablet Take 1 tablet (40 mg total) by mouth  daily at 6 PM. 30 tablet 0  . carvedilol (COREG) 6.25 MG tablet TAKE 1 TABLET(6.25 MG) BY MOUTH TWICE DAILY WITH A MEAL 180 tablet 0  . Cholecalciferol (VITAMIN D PO) Take 1 tablet by mouth daily.    . fluticasone (FLONASE) 50 MCG/ACT nasal spray Place 1 spray into both nostrils daily.    . furosemide (LASIX) 80 MG tablet Take 80 mg by mouth 2 (two) times daily.    Marland Kitchen losartan (COZAAR) 25 MG tablet Take 1 tablet (25 mg total) by mouth daily. 30 tablet 0  . potassium chloride SA (K-DUR,KLOR-CON) 20 MEQ tablet Take 2 tablets (40 mEq total) by mouth daily. 90 tablet 3  . PROAIR HFA 108 (90 Base) MCG/ACT inhaler INHALE 1 PUFF INTO THE LUNGS EVERY 6 HOURS AS NEEDED FOR WHEEZING OR SHORTNESS OF BREATH 8.5 g 0  . rivaroxaban (XARELTO) 20 MG TABS tablet Take 1 tablet (20 mg total) by mouth daily with supper. 30 tablet    No current facility-administered medications on file prior to visit.     BP 128/72 (BP Location: Left Arm, Patient Position: Sitting, Cuff Size: Large)   Pulse 80   Temp 98.5 F (36.9 C) (Oral)   Wt (!) 324 lb 12.8 oz (147.3 kg)   SpO2 97%   BMI  46.60 kg/m       Objective:   Physical Exam  Constitutional: He is oriented to person, place, and time. He appears well-developed and well-nourished.  Morbidly obese male  HENT:  Mouth/Throat: Oropharynx is clear and moist.  Missing teeth noted  Eyes: Pupils are equal, round, and reactive to light. No scleral icterus.  Neck: Neck supple.  Cardiovascular: Normal rate, regular rhythm and intact distal pulses.   Pulmonary/Chest: Effort normal and breath sounds normal. He has no wheezes. He has no rales.  Abdominal: Soft. Bowel sounds are normal. There is no tenderness. There is no rebound.  Musculoskeletal:  Mild nonpitting pedal edema   Lymphadenopathy:    He has no cervical adenopathy.  Neurological: He is alert and oriented to person, place, and time.  Skin: Skin is warm and dry. No rash noted.  Psychiatric: He has a normal mood and affect. His behavior is normal. Judgment and thought content normal.      Assessment & Plan:  1. Dizziness Resolved; no residual symptoms from recent episode evaluated at the hospital. He was suspected to have a TIA or possible dehydration following episodes of diarrhea that triggered dizziness.  He has been monitoring his fluid intake and reports feeling well.  2. History of TIA (transient ischemic attack) Exam is reassuring today; he is following recommendations of cardiology with initiation of eliquis, atorvastatin, discontinued ASA; [facility] evaluation scheduled for 05/17/17  3. Hypertensive heart disease without heart failure   4. Paroxysmal atrial fibrillation (HCC) Eliquis as recommended by cardiology and follow up in 2 weeks.  5. Chronic systolic CHF (congestive heart failure) (HCC) Avoid salt and monitor weight. Advised to report significant weight gain.  6. Hypokalemia Will check BMP today to evaluate for potassium level as he reports adherence to home supplementation  Advised patient to focus on avoiding salt in his diet and monitor  weight as recommended by cardiology and PCP. He will continue Eliquis and follow up with cardiology in 2 weeks.   Follow up with PCP in 2 months or sooner if needed.

## 2017-05-08 NOTE — Telephone Encounter (Signed)
Dr. Kirtland Bouchard, it is done by the insurance.  Hemoccult cards.  Results in your basket.  Let me know if you need anything else.  I updated the health maintenance.

## 2017-05-08 NOTE — Patient Instructions (Signed)
Please continue medications as prescribed and follow up with cardiology with initiation of Eliquis.  We have ordered labs or studies at this visit. It can take up to 1-2 weeks for results and processing. IF results require follow up or explanation, we will call you with instructions. Clinically stable results will be released to your [facility]. If you have not heard from [place] or cannot find your results in [facility] in 2 weeks please contact our office at [phone].  If you are not yet signed up for [facility], please consider signing up   Follow up in August with your primary care provider as scheduled or sooner if needed.

## 2017-05-08 NOTE — Patient Outreach (Signed)
[facility] Customer service manager [facility]) Care [facility] CM Telephone Outreach, Transition of Care day 11  05/08/2017  Charles Mckinney 1951-04-24 [id number]  Successful telephone outreach to Charles Mckinney y.o.malepreviously active with [facility] CM for transition of care after hospitalization.  Patient was re-referred to [facility] for transition of care after recent hospitalization May 22-24, 2018 for TIA/ gait imbalance, and was discharged home with home health services through [facility] for PT/ OT/ and possibly nursing.  Patient has history including, but not limited to, CHF, CAD with CABG x 4 in 2016, paroxysmal Atrial fibrillation, HTN, HLD, and morbid obesity. HIPAA/ identity verified with patient during phone call today, and patient sounds to  Be in no obvious distress throughout entirety of today's phone call.  Today, Charles Mckinney reports that he "is doing okay" since his last hospital discharge, and he denies problems, pain, and concerns.    Patient reports:  -- Has all medications and is taking as prescribed, except for Xarelto, which he reports was "stopped" by cardiologist "last week," due to positive stool hemocult.  Reports that he has now started Atorvastatin (lipitor), as advised at time of hospital discharge.  Patient denies questions, concerns around medications today.  -- has had home visits from home health [facility]) agency, ([facility]) who he reports visited him "twice last week."  Patient verbalized some confusion around difference between home health services and [facility] CM services, and this was clarified today during phone call; patient verbalizes understanding.  -- [facility] [place] [facility]; states drove himself to appointment.  Reports today that he will be attending hospital follow-up appointment with PCP later this afternoon; will drive self to appointment.  Patient was encouraged to keep any follow up appointment  papers for review at [facility] CM initial home visit, which was scheduled today for next week.  -- self-health management of HTN:  Patient reports that he does not currently monitor/ record blood pressures, again stating difficulty in doing so related to recent eye issue.  Patient denies concerning signs/ symptoms today during phone call.  Patient denies further issues, concerns, or problems today. I confirmed that patient hasmy direct phone number, the main [facility] CM office phone number, and the [facility] CM 24-hour nurse advice phone number should issues arise prior to next scheduled [facility] CM outreach with initial home visit next week.  Plan:  Patient will take medications as prescribed and will schedule and attend provider appointments as recommended post-hospital discharge.  Patient will actively participate in home health [facility]) services as ordered post-hospital discharge.  Patient will promptly notify his providers for any new concerns, problems, or issues that arise.  [facility] CM outreach for ongoing transition of care to continue with scheduled initial home visit next week.  [name], RN, BSN, [facility] Care Management  [phone]

## 2017-05-09 DIAGNOSIS — I110 Hypertensive heart disease with heart failure: Secondary | ICD-10-CM | POA: Diagnosis not present

## 2017-05-09 DIAGNOSIS — Z8673 Personal history of transient ischemic attack (TIA), and cerebral infarction without residual deficits: Secondary | ICD-10-CM | POA: Diagnosis not present

## 2017-05-09 DIAGNOSIS — Z7901 Long term (current) use of anticoagulants: Secondary | ICD-10-CM | POA: Diagnosis not present

## 2017-05-09 DIAGNOSIS — I4581 Long QT syndrome: Secondary | ICD-10-CM | POA: Diagnosis not present

## 2017-05-09 DIAGNOSIS — E876 Hypokalemia: Secondary | ICD-10-CM | POA: Diagnosis not present

## 2017-05-09 DIAGNOSIS — I480 Paroxysmal atrial fibrillation: Secondary | ICD-10-CM | POA: Diagnosis not present

## 2017-05-09 DIAGNOSIS — I2510 Atherosclerotic heart disease of native coronary artery without angina pectoris: Secondary | ICD-10-CM | POA: Diagnosis not present

## 2017-05-09 DIAGNOSIS — Z7951 Long term (current) use of inhaled steroids: Secondary | ICD-10-CM | POA: Diagnosis not present

## 2017-05-09 DIAGNOSIS — I5022 Chronic systolic (congestive) heart failure: Secondary | ICD-10-CM | POA: Diagnosis not present

## 2017-05-10 ENCOUNTER — Other Ambulatory Visit: Payer: Self-pay | Admitting: Internal Medicine

## 2017-05-15 DIAGNOSIS — E876 Hypokalemia: Secondary | ICD-10-CM | POA: Diagnosis not present

## 2017-05-15 DIAGNOSIS — I110 Hypertensive heart disease with heart failure: Secondary | ICD-10-CM | POA: Diagnosis not present

## 2017-05-15 DIAGNOSIS — I2510 Atherosclerotic heart disease of native coronary artery without angina pectoris: Secondary | ICD-10-CM | POA: Diagnosis not present

## 2017-05-15 DIAGNOSIS — I480 Paroxysmal atrial fibrillation: Secondary | ICD-10-CM | POA: Diagnosis not present

## 2017-05-15 DIAGNOSIS — Z7951 Long term (current) use of inhaled steroids: Secondary | ICD-10-CM | POA: Diagnosis not present

## 2017-05-15 DIAGNOSIS — I4581 Long QT syndrome: Secondary | ICD-10-CM | POA: Diagnosis not present

## 2017-05-15 DIAGNOSIS — Z8673 Personal history of transient ischemic attack (TIA), and cerebral infarction without residual deficits: Secondary | ICD-10-CM | POA: Diagnosis not present

## 2017-05-15 DIAGNOSIS — Z7901 Long term (current) use of anticoagulants: Secondary | ICD-10-CM | POA: Diagnosis not present

## 2017-05-15 DIAGNOSIS — I5022 Chronic systolic (congestive) heart failure: Secondary | ICD-10-CM | POA: Diagnosis not present

## 2017-05-17 ENCOUNTER — Encounter: Payer: Self-pay | Admitting: *Deleted

## 2017-05-17 ENCOUNTER — Other Ambulatory Visit: Payer: Self-pay | Admitting: *Deleted
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
## 2017-05-23 ENCOUNTER — Other Ambulatory Visit: Payer: Self-pay | Admitting: *Deleted

## 2017-05-23 NOTE — Patient Outreach (Signed)
Attempt made to contact pt, part of ongoing transition of care as this RN CM covering for pt's primary RN CM.  Recent hospitalization 5/22-5/24 for TIA/gait imbalance.   HIPAA compliant voice message left with contact name and number.   Plan:  If no response to voice message, plan to follow up again today.   Shayne Alken.   Pierzchala RN CCM [facility] Care Management  [phone]
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
## 2017-05-23 NOTE — Patient Outreach (Signed)
Received a return phone call shortly after voice message left by this RN CM.  Spoke with pt, HIPAA verified, discussed purpose of call as this RN CM covering for pt's primary RN CM Su Hilt- ongoing follow up on recent hospitalization 5/22-5/24 for TIA/gait imbalance.   Pt reports lost some ground in walking, can't walk as far,became sob yesterday after walking to [employer], sat down/felt better.  Pt reports he walked today earlier, no problems whereas yesterday it was in the afternoon, knows to avoid the heat of the day.  Pt reports no recent falls, balance getting better, son is cutting his lawn now.   Pt reports still taking Eloquis.   Pt reports need to let RN CM go, someone at the door, will call back.    Plan:  This RN CM to wait for return phone call from pt- complete transition of care call.   Shayne Alken.   Patina Spanier RN CCM [facility] Care Management  [phone]

## 2017-05-29 ENCOUNTER — Other Ambulatory Visit: Payer: Self-pay | Admitting: *Deleted

## 2017-05-29 ENCOUNTER — Observation Stay (HOSPITAL_COMMUNITY)
Admission: EM | Admit: 2017-05-29 | Discharge: 2017-05-31 | Disposition: A | Discharge status: Home / Self Care | Payer: Medicare Other | Attending: Internal Medicine | Admitting: Internal Medicine

## 2017-05-29 ENCOUNTER — Emergency Department (HOSPITAL_COMMUNITY): Payer: Medicare Other

## 2017-05-29 ENCOUNTER — Ambulatory Visit (HOSPITAL_COMMUNITY)
Admission: EM | Admit: 2017-05-29 | Discharge: 2017-05-29 | Disposition: A | Discharge status: Home / Self Care | Payer: Medicare Other | Source: Home / Self Care

## 2017-05-29 ENCOUNTER — Encounter: Payer: Self-pay | Admitting: *Deleted

## 2017-05-29 ENCOUNTER — Encounter (HOSPITAL_COMMUNITY): Payer: Self-pay

## 2017-05-29 DIAGNOSIS — Z880 Allergy status to penicillin: Secondary | ICD-10-CM | POA: Insufficient documentation

## 2017-05-29 DIAGNOSIS — I2510 Atherosclerotic heart disease of native coronary artery without angina pectoris: Secondary | ICD-10-CM | POA: Diagnosis not present

## 2017-05-29 DIAGNOSIS — Z6841 Body Mass Index (BMI) 40.0 and over, adult: Secondary | ICD-10-CM | POA: Diagnosis not present

## 2017-05-29 DIAGNOSIS — N183 Chronic kidney disease, stage 3 unspecified: Secondary | ICD-10-CM

## 2017-05-29 DIAGNOSIS — F419 Anxiety disorder, unspecified: Secondary | ICD-10-CM | POA: Diagnosis not present

## 2017-05-29 DIAGNOSIS — I5022 Chronic systolic (congestive) heart failure: Secondary | ICD-10-CM | POA: Insufficient documentation

## 2017-05-29 DIAGNOSIS — I110 Hypertensive heart disease with heart failure: Secondary | ICD-10-CM | POA: Diagnosis not present

## 2017-05-29 DIAGNOSIS — I5042 Chronic combined systolic (congestive) and diastolic (congestive) heart failure: Secondary | ICD-10-CM | POA: Diagnosis present

## 2017-05-29 DIAGNOSIS — Z87891 Personal history of nicotine dependence: Secondary | ICD-10-CM | POA: Diagnosis not present

## 2017-05-29 DIAGNOSIS — R079 Chest pain, unspecified: Principal | ICD-10-CM | POA: Insufficient documentation

## 2017-05-29 DIAGNOSIS — Z888 Allergy status to other drugs, medicaments and biological substances status: Secondary | ICD-10-CM | POA: Insufficient documentation

## 2017-05-29 DIAGNOSIS — Z7952 Long term (current) use of systemic steroids: Secondary | ICD-10-CM | POA: Diagnosis not present

## 2017-05-29 DIAGNOSIS — R072 Precordial pain: Secondary | ICD-10-CM

## 2017-05-29 DIAGNOSIS — K219 Gastro-esophageal reflux disease without esophagitis: Secondary | ICD-10-CM | POA: Diagnosis not present

## 2017-05-29 DIAGNOSIS — Z951 Presence of aortocoronary bypass graft: Secondary | ICD-10-CM

## 2017-05-29 DIAGNOSIS — Z79899 Other long term (current) drug therapy: Secondary | ICD-10-CM | POA: Insufficient documentation

## 2017-05-29 DIAGNOSIS — E785 Hyperlipidemia, unspecified: Secondary | ICD-10-CM | POA: Diagnosis present

## 2017-05-29 DIAGNOSIS — I130 Hypertensive heart and chronic kidney disease with heart failure and stage 1 through stage 4 chronic kidney disease, or unspecified chronic kidney disease: Secondary | ICD-10-CM | POA: Diagnosis not present

## 2017-05-29 DIAGNOSIS — I10 Essential (primary) hypertension: Secondary | ICD-10-CM | POA: Diagnosis present

## 2017-05-29 DIAGNOSIS — Z7901 Long term (current) use of anticoagulants: Secondary | ICD-10-CM | POA: Diagnosis not present

## 2017-05-29 DIAGNOSIS — M109 Gout, unspecified: Secondary | ICD-10-CM | POA: Diagnosis not present

## 2017-05-29 DIAGNOSIS — I480 Paroxysmal atrial fibrillation: Secondary | ICD-10-CM | POA: Diagnosis not present

## 2017-05-29 DIAGNOSIS — I4819 Other persistent atrial fibrillation: Secondary | ICD-10-CM | POA: Diagnosis present

## 2017-05-29 HISTORY — DX: Chronic kidney disease, stage 3 unspecified: N18.30

## 2017-05-29 LAB — BASIC METABOLIC PANEL
ANION GAP: 10 (ref 5–15)
BUN: 13 mg/dL (ref 6–20)
CHLORIDE: 106 mmol/L (ref 101–111)
CO2: 26 mmol/L (ref 22–32)
Calcium: 9 mg/dL (ref 8.9–10.3)
Creatinine, Ser: 1.28 mg/dL — ABNORMAL HIGH (ref 0.61–1.24)
GFR calc non Af Amer: 57 mL/min — ABNORMAL LOW (ref >60)
Glucose, Bld: 156 mg/dL — ABNORMAL HIGH (ref 65–99)
Potassium: 3.5 mmol/L (ref 3.5–5.1)
SODIUM: 142 mmol/L (ref 135–145)

## 2017-05-29 LAB — I-STAT TROPONIN, ED
Troponin i, poc: 0.02 ng/mL (ref 0.00–0.08)
Troponin i, poc: 0 ng/mL (ref 0.00–0.08)

## 2017-05-29 LAB — CBC
HEMATOCRIT: 43.2 % (ref 39.0–52.0)
Hemoglobin: 14 g/dL (ref 13.0–17.0)
MCH: 29.7 pg (ref 26.0–34.0)
MCHC: 32.4 g/dL (ref 30.0–36.0)
MCV: 91.5 fL (ref 78.0–100.0)
Platelets: 242 10*3/uL (ref 150–400)
RBC: 4.72 MIL/uL (ref 4.22–5.81)
RDW: 14.4 % (ref 11.5–15.5)
WBC: 9.3 10*3/uL (ref 4.0–10.5)

## 2017-05-29 LAB — HEPATIC FUNCTION PANEL
ALBUMIN: 3.6 g/dL (ref 3.5–5.0)
ALK PHOS: 75 U/L (ref 38–126)
ALT: 17 U/L (ref 17–63)
AST: 27 U/L (ref 15–41)
Bilirubin, Direct: 0.1 mg/dL (ref 0.1–0.5)
Indirect Bilirubin: 0.5 mg/dL (ref 0.3–0.9)
TOTAL PROTEIN: 6.6 g/dL (ref 6.5–8.1)
Total Bilirubin: 0.6 mg/dL (ref 0.3–1.2)

## 2017-05-29 LAB — APTT: aPTT: 30 seconds (ref 24–36)

## 2017-05-29 LAB — LIPASE, BLOOD: Lipase: 25 U/L (ref 11–51)

## 2017-05-29 MED ORDER — ALLOPURINOL 100 MG PO TABS
100.0000 mg | ORAL_TABLET | Freq: Every day | ORAL | Status: DC
Start: 2017-05-30 — End: 2017-05-31
  Administered 2017-05-30 – 2017-05-31 (×2): 100 mg via ORAL
  Filled 2017-05-29 (×2): qty 1

## 2017-05-29 MED ORDER — NITROGLYCERIN 0.4 MG SL SUBL
0.4000 mg | SUBLINGUAL_TABLET | SUBLINGUAL | Status: DC | PRN
  Administered 2017-05-29 (×2): 0.4 mg via SUBLINGUAL
  Filled 2017-05-29: qty 1

## 2017-05-29 MED ORDER — MORPHINE SULFATE (PF) 4 MG/ML IV SOLN
2.0000 mg | INTRAVENOUS | Status: DC | PRN
  Administered 2017-05-31: 2 mg via INTRAVENOUS
  Filled 2017-05-29: qty 1

## 2017-05-29 MED ORDER — FUROSEMIDE 20 MG PO TABS
80.0000 mg | ORAL_TABLET | Freq: Two times a day (BID) | ORAL | Status: DC
  Administered 2017-05-30 – 2017-05-31 (×4): 80 mg via ORAL
  Filled 2017-05-29 (×4): qty 4

## 2017-05-29 MED ORDER — AMIODARONE HCL 200 MG PO TABS
200.0000 mg | ORAL_TABLET | Freq: Every day | ORAL | Status: DC
  Administered 2017-05-30 – 2017-05-31 (×2): 200 mg via ORAL
  Filled 2017-05-29 (×2): qty 1

## 2017-05-29 MED ORDER — FLUTICASONE PROPIONATE 50 MCG/ACT NA SUSP
1.0000 | Freq: Every day | NASAL | Status: DC
  Administered 2017-05-31: 1 via NASAL
  Filled 2017-05-29: qty 16

## 2017-05-29 MED ORDER — LOSARTAN POTASSIUM 25 MG PO TABS
25.0000 mg | ORAL_TABLET | Freq: Every day | ORAL | Status: DC
  Administered 2017-05-30 – 2017-05-31 (×2): 25 mg via ORAL
  Filled 2017-05-29 (×2): qty 1

## 2017-05-29 MED ORDER — CARVEDILOL 12.5 MG PO TABS
6.2500 mg | ORAL_TABLET | Freq: Two times a day (BID) | ORAL | Status: DC
  Administered 2017-05-30 – 2017-05-31 (×4): 6.25 mg via ORAL
  Filled 2017-05-29 (×4): qty 1

## 2017-05-29 MED ORDER — ACETAMINOPHEN 325 MG PO TABS: 650.0000 mg | ORAL_TABLET | Freq: Four times a day (QID) | ORAL | Status: DC | PRN

## 2017-05-29 MED ORDER — ATORVASTATIN CALCIUM 40 MG PO TABS
40.0000 mg | ORAL_TABLET | Freq: Every day | ORAL | Status: DC
  Administered 2017-05-30 – 2017-05-31 (×2): 40 mg via ORAL
  Filled 2017-05-29 (×2): qty 1

## 2017-05-29 MED ORDER — HEPARIN (PORCINE) IN NACL 100-0.45 UNIT/ML-% IJ SOLN
1650.0000 [IU]/h | INTRAMUSCULAR | Status: DC
  Administered 2017-05-29 – 2017-05-30 (×2): 1550 [IU]/h via INTRAVENOUS
  Administered 2017-05-31: 1650 [IU]/h via INTRAVENOUS
  Filled 2017-05-29 (×3): qty 250

## 2017-05-29 MED ORDER — ALBUTEROL SULFATE (2.5 MG/3ML) 0.083% IN NEBU: 2.5000 mg | INHALATION_SOLUTION | RESPIRATORY_TRACT | Status: DC | PRN

## 2017-05-29 MED ORDER — ZOLPIDEM TARTRATE 5 MG PO TABS: 5.0000 mg | ORAL_TABLET | Freq: Every evening | ORAL | Status: DC | PRN

## 2017-05-29 MED ORDER — ONDANSETRON HCL 4 MG/2ML IJ SOLN: 4.0000 mg | Freq: Four times a day (QID) | INTRAMUSCULAR | Status: DC | PRN

## 2017-05-29 MED ORDER — VITAMIN D 1000 UNITS PO TABS
1000.0000 [IU] | ORAL_TABLET | Freq: Every day | ORAL | Status: DC
  Administered 2017-05-30 – 2017-05-31 (×2): 1000 [IU] via ORAL
  Filled 2017-05-29 (×2): qty 1

## 2017-05-29 MED ORDER — MORPHINE SULFATE (PF) 4 MG/ML IV SOLN
4.0000 mg | Freq: Once | INTRAVENOUS | Status: AC
  Administered 2017-05-29: 4 mg via INTRAVENOUS
  Filled 2017-05-29: qty 1
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
## 2017-05-29 NOTE — ED Notes (Signed)
Dr Niu at bedside 

## 2017-05-29 NOTE — ED Notes (Signed)
ED Provider at bedside. 

## 2017-05-29 NOTE — ED Triage Notes (Signed)
PT reports left chest and side pain earlier this morning when cooking breakfast. PT states he took 1 SL ntg with some relief. Pt states tightness feels better when is pushes on epigastric area. NAD. VSS

## 2017-05-29 NOTE — ED Notes (Signed)
Mini lab contacted about patient's troponin, Josh advised troponin was 0.02

## 2017-05-29 NOTE — ED Notes (Signed)
Patient not seen by this nurse.  Was told patient was assessed by Hayden Rasmussen, np and patient chose to go to ed.

## 2017-05-29 NOTE — Progress Notes (Signed)
ANTICOAGULATION CONSULT NOTE - Initial Consult  Pharmacy Consult for heparin (PTA Eliquis on hold) Indication: chest pain/ACS/afib   Allergies  Allergen Reactions  . Niacin And Related Other (See Comments)    FLUSHING  . Xarelto [Rivaroxaban] Other (See Comments)    Cause bloody stools  . Penicillins Itching and Rash    Has patient had a PCN reaction causing immediate rash, facial/tongue/throat swelling, SOB or lightheadedness with hypotension: Yes Has patient had a PCN reaction causing severe rash involving mucus membranes or skin necrosis: No Has patient had a PCN reaction that required hospitalization: No Has patient had a PCN reaction occurring within the last 10 years: No If all of the above answers are "NO", then may proceed with Cephalosporin use.     Patient Measurements: Height: 5\' 10"  (177.8 cm) Weight: (!) 318 lb (144.2 kg) IBW/kg (Calculated) : 73 Heparin Dosing Weight: 107 kg   Vital Signs: Temp: 98 F (36.7 C) (06/25 1933) Temp Source: Oral (06/25 1933) BP: 135/83 (06/25 2215) Pulse Rate: 87 (06/25 2215)  Labs:  Recent Labs  05/29/17 1541  HGB 14.0  HCT 43.2  PLT 242  CREATININE 1.28*    Estimated Creatinine Clearance: 81.5 mL/min (A) (by C-G formula based on SCr of 1.28 mg/dL (H)).   Medical History: Past Medical History:  Diagnosis Date  . Anxiety   . CHF (congestive heart failure) (HCC)   . Dysrhythmia    ATRIAL FIBRILATION, resolved with cardioversion  . GERD (gastroesophageal reflux disease)   . Hyperlipidemia   . Hypertension   . Hypertensive heart disease   . Kidney stones   . Obesity (BMI 30-39.9)   . Osteoarthritis   . PONV (postoperative nausea and vomiting)   . Shortness of breath dyspnea     Assessment: 66 yo male admitted with chest pain. Pharmacy consulted to dose heparin. History of afib on Eliquis, with last dose at 0900 this morning (6/25). CBC stable and no s/s bleeding noted.   Will start heparin gtt without bolus  due to PTA Eliquis.   Goal of Therapy:  Heparin level 0.3-0.7 units/ml Monitor platelets by anticoagulation protocol: Yes   Plan:  Start heparin gtt at 1550 units/hr  Heparin level and aPTT in 6 hours  Daily heparin level, aPTT, and CBC Monitor for s/s bleeding  York Cerise, PharmD Pharmacy Resident  Pager [phone] 05/29/17 10:39 PM

## 2017-05-29 NOTE — ED Notes (Signed)
I-stat Troponin value of 0.02 resulted at 16:28.

## 2017-05-29 NOTE — ED Notes (Signed)
Gave pt Sprite and pre-packed bagged lunch ([place] sandwich and applesauce), per Dr. Jeraldine Loots.

## 2017-05-29 NOTE — ED Notes (Signed)
Phlebotomy at bedside, pt reports pain 1/10 additional nitro given.

## 2017-05-29 NOTE — ED Notes (Signed)
Pt is now pain free after 2nd nitro

## 2017-05-29 NOTE — ED Provider Notes (Signed)
[facility]-EMERGENCY DEPT Provider Note   CSN: [id number] Arrival date & time: 05/29/17  1521     History   Chief Complaint Chief Complaint  Patient presents with  . Chest Pain    HPI Charles Mckinney is a 66 y.o. male.  The history is provided by the patient and medical records. No language interpreter was used.  Chest Pain   This is a new problem. The current episode started 6 to 12 hours ago. The problem occurs constantly. The problem has been gradually improving. The pain is associated with exertion and movement. The pain is present in the substernal region. The pain is moderate. The quality of the pain is described as pressure-like. The pain does not radiate. Duration of episode(s) is 6 hours. Pertinent negatives include no abdominal pain, no back pain, no cough, no fever, no palpitations, no shortness of breath and no vomiting. Risk factors include male gender and obesity.  His past medical history is significant for CAD, hyperlipidemia and hypertension.  Pertinent negatives for past medical history include no seizures.    Past Medical History:  Diagnosis Date  . Anxiety   . CHF (congestive heart failure) (HCC)   . Dysrhythmia    ATRIAL FIBRILATION, resolved with cardioversion  . GERD (gastroesophageal reflux disease)   . Hyperlipidemia   . Hypertension   . Hypertensive heart disease   . Kidney stones   . Obesity (BMI 30-39.9)   . Osteoarthritis   . PONV (postoperative nausea and vomiting)   . Shortness of breath dyspnea     Patient Active Problem List   Diagnosis Date Noted  . Essential hypertension 05/30/2017  . Gout 05/30/2017  . Chest pain 05/29/2017  . CKD (chronic kidney disease), stage III 05/29/2017  . TIA (transient ischemic attack) 04/26/2017  . Hypokalemia 04/26/2017  . Prolonged QT interval 04/26/2017  . 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 Acute on chronic systolic congestive heart failure (HCC) 01/19/2015  . Chronic systolic heart failure (HCC) 01/19/2015  .  Long-term (current) use of anticoagulants 01/19/2015  . Paroxysmal atrial fibrillation (HCC)   . GERD 11/22/2007  . Hyperlipidemia   . Morbid obesity (HCC)   . Hypertensive heart disease     Past Surgical History:  Procedure Laterality Date  . CARDIAC CATHETERIZATION N/A 04/09/2015   Procedure: Right/Left Heart Cath and Coronary Angiography;  Surgeon: Orpah Cobb, MD;  Location: [facility];  Service: Cardiovascular;  Laterality: N/A;  . CARDIOVERSION N/A 01/19/2015   Procedure: CARDIOVERSION;  Surgeon: Othella Boyer, MD;  Location: [facility] ENDOSCOPY;  Service: Cardiovascular;  Laterality: N/A;  pt in Afib, 12:22 synched cardioversion @  120 joules using Propofol 100 mg,IV....unsuccessful, repeated at 200 joules  . CARDIOVERSION N/A 04/02/2015   Procedure: CARDIOVERSION;  Surgeon: Orpah Cobb, MD;  Location: [facility] ENDOSCOPY;  Service: Cardiovascular;  Laterality: N/A;  . CATARACT EXTRACTION    . CLIPPING OF ATRIAL APPENDAGE  04/13/2015   Procedure: CLIPPING OF ATRIAL APPENDAGE;  Surgeon: Alleen Borne, MD;  Location: [facility] OR;  Service: Open Heart Surgery;;  . CORONARY ARTERY BYPASS GRAFT N/A 04/13/2015   Procedure: CORONARY ARTERY BYPASS GRAFTING (CABG);  Surgeon: Alleen Borne, MD;  Location: [facility];  Service: Open Heart Surgery;  Laterality: N/A;  . TEE WITHOUT CARDIOVERSION N/A 04/13/2015   Procedure: TRANSESOPHAGEAL ECHOCARDIOGRAM (TEE);  Surgeon: Alleen Borne, MD;  Location: [facility] OR;  Service: Open Heart Surgery;  Laterality: N/A;       Home Medications  Prior to Admission medications   Medication Sig Start Date End Date Taking? Authorizing Provider  acetaminophen (TYLENOL) 500 MG tablet Take 1,000 mg by mouth every 6 (six) hours as needed for moderate pain. Reported on 03/11/2016   Yes [provider]  allopurinol (ZYLOPRIM) 100 MG tablet TAKE 1 TABLET(100 MG) BY MOUTH DAILY 01/09/17  Yes [name], MD  amiodarone (PACERONE) 200 MG tablet TAKE 1 TABLET(200 MG)  BY MOUTH DAILY 05/11/17  Yes [name], MD  apixaban (ELIQUIS) 2.5 MG TABS tablet Take 2.5 mg by mouth 2 (two) times daily.    Yes [provider]  atorvastatin (LIPITOR) 40 MG tablet Take 1 tablet (40 mg total) by mouth daily at 6 PM. Patient taking differently: Take 40 mg by mouth daily.  04/27/17  Yes Vann, Jessica U, DO  carvedilol (COREG) 6.25 MG tablet TAKE 1 TABLET(6.25 MG) BY MOUTH TWICE DAILY WITH A MEAL 03/31/17  Yes [name], MD  Cholecalciferol (VITAMIN D PO) Take 1 tablet by mouth daily.   Yes [provider]  fluticasone (FLONASE) 50 MCG/ACT nasal spray Place 1 spray into both nostrils daily.   Yes [provider]  furosemide (LASIX) 80 MG tablet Take 80 mg by mouth 2 (two) times daily.   Yes [provider]  losartan (COZAAR) 25 MG tablet Take 1 tablet (25 mg total) by mouth daily. 12/15/16  Yes Calvert Cantor, MD  nitroGLYCERIN (NITROSTAT) 0.4 MG SL tablet Place 0.4 mg under the tongue every 5 (five) minutes as needed for chest pain.   Yes [provider]  potassium chloride SA (K-DUR,KLOR-CON) 20 MEQ tablet Take 2 tablets (40 mEq total) by mouth daily. Patient taking differently: Take 20 mEq by mouth 2 (two) times daily.  12/15/16  Yes Calvert Cantor, MD  PROAIR HFA 108 [phone] Base) MCG/ACT inhaler INHALE 1 PUFF INTO THE LUNGS EVERY 6 HOURS AS NEEDED FOR WHEEZING OR SHORTNESS OF BREATH 02/14/17  Yes [name], MD  rivaroxaban (XARELTO) 20 MG TABS tablet Take 1 tablet (20 mg total) by mouth daily with supper. Patient not taking: Reported on 05/17/2017 04/28/17   Joseph Art, DO    Family History Family History  Problem Relation Age of Onset  . Diabetes Mother   . Heart disease Father   . Congestive Heart Failure Sister   . Stroke Brother   . Heart disease Brother   . Diabetes Brother     Social History Social History  Substance Use Topics  . Smoking status: Former Smoker    Quit date: 2000  .  Smokeless tobacco: Never Used  . Alcohol use No     Allergies   Niacin and related; Xarelto [rivaroxaban]; and Penicillins   Review of Systems Review of Systems  Constitutional: Negative for chills and fever.  HENT: Negative for ear pain and sore throat.   Eyes: Negative for pain and visual disturbance.  Respiratory: Negative for cough and shortness of breath.   Cardiovascular: Positive for chest pain. Negative for palpitations.  Gastrointestinal: Negative for abdominal pain and vomiting.  Genitourinary: Negative for dysuria and hematuria.  Musculoskeletal: Negative for arthralgias and back pain.  Skin: Negative for color change and rash.  Neurological: Negative for seizures and syncope.  All other systems reviewed and are negative.    Physical Exam Updated Vital Signs BP 119/65 (BP Location: Right Arm)   Pulse 74   Temp 97.9 F (36.6 C) (Oral)   Resp 18   Ht  5\' 10"  (1.778 m)   Wt (!) 146.2 kg (322 lb 6.4 oz)   SpO2 95%   BMI 46.26 kg/m   Physical Exam  Constitutional: He appears well-developed.  HENT:  Head: Normocephalic and atraumatic.  Eyes: Conjunctivae are normal.  Neck: Neck supple.  Cardiovascular: Normal rate and regular rhythm.   No murmur heard. Pulmonary/Chest: Effort normal and breath sounds normal. No respiratory distress.  Abdominal: Soft. There is no tenderness.  Musculoskeletal: He exhibits no edema.  Neurological: He is alert. No cranial nerve deficit. Coordination normal.  5/5 motor strength and intact sensation in all extremities. Intact bilateral finger-to-nose coordination  Skin: Skin is warm and dry.  Nursing note and vitals reviewed.    ED Treatments / Results  Labs (all labs ordered are listed, but only abnormal results are displayed) Labs Reviewed  BASIC METABOLIC PANEL - Abnormal; Notable for the following:       Result Value   Glucose, Bld 156 (*)    Creatinine, Ser 1.28 (*)    GFR calc non Af Amer 57 (*)    All other  components within normal limits  HEMOGLOBIN A1C - Abnormal; Notable for the following:    Hgb A1c MFr Bld 6.0 (*)    All other components within normal limits  HEPARIN LEVEL (UNFRACTIONATED) - Abnormal; Notable for the following:    Heparin Unfractionated 1.07 (*)    All other components within normal limits  HEPARIN LEVEL (UNFRACTIONATED) - Abnormal; Notable for the following:    Heparin Unfractionated 0.96 (*)    All other components within normal limits  LIPID PANEL - Abnormal; Notable for the following:    Triglycerides 247 (*)    VLDL 49 (*)    All other components within normal limits  APTT - Abnormal; Notable for the following:    aPTT 65 (*)    All other components within normal limits  BRAIN NATRIURETIC PEPTIDE - Abnormal; Notable for the following:    B Natriuretic Peptide 205.0 (*)    All other components within normal limits  APTT - Abnormal; Notable for the following:    aPTT 78 (*)    All other components within normal limits  APTT - Abnormal; Notable for the following:    aPTT 78 (*)    All other components within normal limits  CBC  HEPATIC FUNCTION PANEL  LIPASE, BLOOD  RAPID URINE DRUG SCREEN, HOSP PERFORMED  TROPONIN I  APTT  CBC  TROPONIN I  TROPONIN I  CBC  HEPARIN LEVEL (UNFRACTIONATED)  HEPARIN LEVEL (UNFRACTIONATED)  BASIC METABOLIC PANEL  MAGNESIUM  I-STAT TROPOININ, ED  I-STAT TROPOININ, ED  I-STAT TROPOININ, ED    EKG  EKG Interpretation  Date/Time:  Monday May 29 2017 15:26:07 EDT Ventricular Rate:  105 PR Interval:    QRS Duration: 138 QT Interval:  340 QTC Calculation: 449 R Axis:   -62 Text Interpretation:  Atrial fibrillation with rapid ventricular response Right bundle branch block Left anterior fascicular block *Bifascicular block * Moderate voltage criteria for LVH, may be normal variant Anteroseptal infarct , age undetermined Abnormal ECG Confirmed by Rochele Raring [phone]) on 05/30/2017 1:49:43 PM       Radiology Dg  Chest 2 View  Result Date: 05/29/2017 CLINICAL DATA:  Left chest pain. EXAM: CHEST  2 VIEW COMPARISON:  03/06/2017. FINDINGS: Stable enlarged cardiac silhouette, post CABG changes and left atrial clip. The aorta remains mildly tortuous and calcified. Stable mild prominence of the pulmonary vasculature and interstitial markings.  No pleural fluid. Thoracic spine degenerative changes. IMPRESSION: No acute abnormality. Stable cardiomegaly, mild pulmonary vascular congestion and mild chronic interstitial lung disease. Electronically Signed   By: Beckie Salts M.D.   On: 05/29/2017 16:31   Nm Myocar Multi W/spect W/wall Motion / Ef  Result Date: 05/31/2017 CLINICAL DATA:  Chest pain and shortness of breath. Congestive heart failure. Coronary artery disease and previous CABG. EXAM: MYOCARDIAL IMAGING WITH SPECT (REST AND PHARMACOLOGIC-STRESS - 2 DAY PROTOCOL) GATED LEFT VENTRICULAR WALL MOTION STUDY LEFT VENTRICULAR EJECTION FRACTION TECHNIQUE: Standard myocardial SPECT imaging was performed after resting intravenous injection of 30 mCi Tc-66m tetrofosmin. Subsequently, on a second day, intravenous infusion of Lexiscan was performed under the supervision of the Cardiology staff. At peak effect of the drug, 30 mCi Tc-49m tetrofosmin was injected intravenously and standard myocardial SPECT imaging was performed. Quantitative gated imaging was also performed to evaluate left ventricular wall motion, and estimate left ventricular ejection fraction. COMPARISON:  None. FINDINGS: Perfusion: A large moderate fixed area of decreased myocardial activity is seen in the anterior, apical, and septal walls on both the stress and rest images, consistent with infarction. No reversible myocardial perfusion defects are seen to suggest the presence of inducible ischemia. Wall Motion: Moderate left ventricular dilatation with diffuse hypokinesis. Left Ventricular Ejection Fraction: 27 % End diastolic volume 179 ml End systolic volume 132  ml IMPRESSION: 1. No evidence of reversible myocardial ischemia. Large infarct involving the anterior, apical, and septal walls. 2. Diffuse left ventricular hypokinesis with moderate left ventricular dilatation. 3. Left ventricular ejection fraction 27% 4. Non invasive risk stratification*: High *2012 Appropriate Use Criteria for Coronary Revascularization Focused Update: J Am Coll Cardiol. 2012;59(9):857-881. http://content.dementiazones.com.aspx?articleid=[email or web address] Electronically Signed   By: Myles Rosenthal M.D.   On: 05/31/2017 11:13    Procedures Procedures (including critical care time)  Medications Ordered in ED Medications  morphine 4 MG/ML injection 2 mg (2 mg Intravenous Given 05/31/17 0633)  amiodarone (PACERONE) tablet 200 mg (200 mg Oral Given 05/31/17 0952)  atorvastatin (LIPITOR) tablet 40 mg (40 mg Oral Given 05/30/17 1708)  cholecalciferol (VITAMIN D) tablet 1,000 Units (1,000 Units Oral Given 05/31/17 0952)  furosemide (LASIX) tablet 80 mg (80 mg Oral Given 05/31/17 0951)  carvedilol (COREG) tablet 6.25 mg (6.25 mg Oral Given 05/31/17 1003)  allopurinol (ZYLOPRIM) tablet 100 mg (100 mg Oral Given 05/31/17 0952)  losartan (COZAAR) tablet 25 mg (25 mg Oral Given 05/31/17 0952)  fluticasone (FLONASE) 50 MCG/ACT nasal spray 1 spray (1 spray Each Nare Given 05/31/17 1003)  acetaminophen (TYLENOL) tablet 650 mg (not administered)  nitroGLYCERIN (NITROSTAT) SL tablet 0.4 mg (0.4 mg Sublingual Given 05/29/17 2250)  albuterol (PROVENTIL) (2.5 MG/3ML) 0.083% nebulizer solution 2.5 mg (not administered)  ondansetron (ZOFRAN) injection 4 mg (not administered)  zolpidem (AMBIEN) tablet 5 mg (not administered)  heparin ADULT infusion 100 units/mL (25000 units/273mL sodium chloride 0.45%) (1,650 Units/hr Intravenous New Bag/Given 05/31/17 0522)  aspirin EC tablet 81 mg (81 mg Oral Given 05/31/17 0952)  morphine 4 MG/ML injection 4 mg (4 mg Intravenous Given 05/29/17 2100)  regadenoson (LEXISCAN)  injection SOLN 0.4 mg (0.4 mg Intravenous Given 05/30/17 1045)  technetium tetrofosmin (TC-MYOVIEW) injection 30 millicurie (30 millicuries Intravenous Contrast Given 05/31/17 0911)  technetium tetrofosmin (TC-MYOVIEW) injection 30 millicurie (30 millicuries Intravenous Contrast Given 05/31/17 0935)     Initial Impression / Assessment and Plan / ED Course  I have reviewed the triage vital signs and the nursing notes.  Pertinent labs & imaging results that were  available during my care of the patient were reviewed by me and considered in my medical decision making (see chart for details).     66 year old male history of CAD S/P CABG, HTN, HLD, obesity, CHF who presents with onset of R lateral chest pain with migration to substernal region.  Onset this morning.  Was exertional and nonpleuritic.  Describes improvement of pain while pressing on affected region.  States pain improved with nitroglycerin.  Last had CABG in 2016.  No recent interrogation.  Denies DVT or PE.  Denies fever, cough, or other symptoms.  Afebrile, VSS.  Lungs clear to auscultation bilaterally.  Abdomen soft benign throughout.  EKG showing A. Fib with HR 100. Initial troponin undetectable. Pt with High risk HEART score. No recent cardiac interrogation. He is admitted for further management and evaluation. Pt stable at time of transfer.  Pt care d/w Dr. Jeraldine Loots  Final Clinical Impressions(s) / ED Diagnoses   Final diagnoses:  Precordial chest pain    New Prescriptions Current Discharge Medication List       Hebert Soho, MD 05/31/17 1129    Gerhard Munch, MD 06/03/17 0009

## 2017-05-29 NOTE — H&P (Signed)
History and Physical    Charles Mckinney ZOX:[id number] DOB: 11/23/1951 DOA: 05/29/2017  Referring MD/NP/PA:   PCP: [name], MD   Patient coming from:  The patient is coming from home.  At baseline, pt is independent for most of ADL.   Chief Complaint: chest pain  HPI: Charles Mckinney is a 66 y.o. male with medical history significant of hypertension, hyperlipidemia, GERD, gout, obesity, sCHF with EF 40%, PAF on Eliquis, CAD, s/p of CABG, CKD-2, gout, anxiety, who presents with chest pain.  Patient states that his chest pain started this morning. It is located in the right side of chest, constant, 8 out of 10 in severity, pressure-like, nonradiating. It is associated SOB only on exertion, no cough, fever or chills. Patient denies tenderness in the calf areas. No recent long distant traveling. Denies nausea, vomiting, diarrhea, abdominal pain, symptoms of UTI or unilateral weakness.  ED Course: pt was found to have troponin negative, WBC 9.3, lipase 25, stable renal function, temperature normal, oxygen saturation 92-95% on room air, chest x-ray showed mild vascular congestion. Patient is placed on telemetry bed for observation.  Review of Systems:   General: no fevers, chills, no changes in body weight, has fatigue HEENT: no blurry vision, hearing changes or sore throat Respiratory: has dyspnea, no coughing, wheezing CV: has chest pain, no palpitations GI: no nausea, vomiting, abdominal pain, diarrhea, constipation GU: no dysuria, burning on urination, increased urinary frequency, hematuria  Ext: has trace leg edema Neuro: no unilateral weakness, numbness, or tingling, no vision change or hearing loss Skin: no rash, no skin tear. MSK: No muscle spasm, no deformity, no limitation of range of movement in spin Heme: No easy bruising.  Travel history: No recent long distant travel.  Allergy:  Allergies  Allergen Reactions  . Niacin And Related Other (See Comments)   FLUSHING  . Xarelto [Rivaroxaban] Other (See Comments)    Cause bloody stools  . Penicillins Itching and Rash    Has patient had a PCN reaction causing immediate rash, facial/tongue/throat swelling, SOB or lightheadedness with hypotension: Yes Has patient had a PCN reaction causing severe rash involving mucus membranes or skin necrosis: No Has patient had a PCN reaction that required hospitalization: No Has patient had a PCN reaction occurring within the last 10 years: No If all of the above answers are "NO", then may proceed with Cephalosporin use.     Past Medical History:  Diagnosis Date  . Anxiety   . CHF (congestive heart failure) (HCC)   . Dysrhythmia    ATRIAL FIBRILATION, resolved with cardioversion  . GERD (gastroesophageal reflux disease)   . Hyperlipidemia   . Hypertension   . Hypertensive heart disease   . Kidney stones   . Obesity (BMI 30-39.9)   . Osteoarthritis   . PONV (postoperative nausea and vomiting)   . Shortness of breath dyspnea     Past Surgical History:  Procedure Laterality Date  . CARDIAC CATHETERIZATION N/A 04/09/2015   Procedure: Right/Left Heart Cath and Coronary Angiography;  Surgeon: Orpah Cobb, MD;  Location: [facility] INVASIVE CV LAB CUPID;  Service: Cardiovascular;  Laterality: N/A;  . CARDIOVERSION N/A 01/19/2015   Procedure: CARDIOVERSION;  Surgeon: Othella Boyer, MD;  Location: [facility] ENDOSCOPY;  Service: Cardiovascular;  Laterality: N/A;  pt in Afib, 12:22 synched cardioversion @  120 joules using Propofol 100 mg,IV....unsuccessful, repeated at 200 joules  . CARDIOVERSION N/A 04/02/2015   Procedure: CARDIOVERSION;  Surgeon: Orpah Cobb, MD;  Location: [facility] ENDOSCOPY;  Service: Cardiovascular;  Laterality: N/A;  . CATARACT EXTRACTION    . CLIPPING OF ATRIAL APPENDAGE  04/13/2015   Procedure: CLIPPING OF ATRIAL APPENDAGE;  Surgeon: Alleen Borne, MD;  Location: [facility] OR;  Service: Open Heart Surgery;;  . CORONARY ARTERY BYPASS GRAFT N/A 04/13/2015    Procedure: CORONARY ARTERY BYPASS GRAFTING (CABG);  Surgeon: Alleen Borne, MD;  Location: [facility] OR;  Service: Open Heart Surgery;  Laterality: N/A;  . TEE WITHOUT CARDIOVERSION N/A 04/13/2015   Procedure: TRANSESOPHAGEAL ECHOCARDIOGRAM (TEE);  Surgeon: Alleen Borne, MD;  Location: [facility] OR;  Service: Open Heart Surgery;  Laterality: N/A;    Social History:  reports that he quit smoking about 18 years ago. He has never used smokeless tobacco. He reports that he does not drink alcohol or use drugs.  Family History:  Family History  Problem Relation Age of Onset  . Diabetes Mother   . Heart disease Father   . Congestive Heart Failure Sister   . Stroke Brother   . Heart disease Brother   . Diabetes Brother      Prior to Admission medications   Medication Sig Start Date End Date Taking? Authorizing Provider  acetaminophen (TYLENOL) 500 MG tablet Take 1,000 mg by mouth every 6 (six) hours as needed for moderate pain. Reported on 03/11/2016    [provider]  allopurinol (ZYLOPRIM) 100 MG tablet TAKE 1 TABLET(100 MG) BY MOUTH DAILY 01/09/17   [name], MD  amiodarone (PACERONE) 200 MG tablet TAKE 1 TABLET(200 MG) BY MOUTH DAILY 05/11/17   [name], MD  atorvastatin (LIPITOR) 40 MG tablet Take 1 tablet (40 mg total) by mouth daily at 6 PM. 04/27/17   Marlin Canary U, DO  carvedilol (COREG) 6.25 MG tablet TAKE 1 TABLET(6.25 MG) BY MOUTH TWICE DAILY WITH A MEAL 03/31/17   [name], MD  Cholecalciferol (VITAMIN D PO) Take 1 tablet by mouth daily.    [provider]  fluticasone (FLONASE) 50 MCG/ACT nasal spray Place 1 spray into both nostrils daily.    [provider]  furosemide (LASIX) 80 MG tablet Take 80 mg by mouth 2 (two) times daily.    [provider]  losartan (COZAAR) 25 MG tablet Take 1 tablet (25 mg total) by mouth daily. 12/15/16   Calvert Cantor, MD  potassium chloride SA (K-DUR,KLOR-CON) 20 MEQ tablet Take 2 tablets (40 mEq  total) by mouth daily. 12/15/16   Calvert Cantor, MD  PROAIR HFA 108 [phone] Base) MCG/ACT inhaler INHALE 1 PUFF INTO THE LUNGS EVERY 6 HOURS AS NEEDED FOR WHEEZING OR SHORTNESS OF BREATH 02/14/17   [name], MD  rivaroxaban (XARELTO) 20 MG TABS tablet Take 1 tablet (20 mg total) by mouth daily with supper. Patient not taking: Reported on 05/17/2017 04/28/17   Joseph Art, DO    Physical Exam: Vitals:   05/29/17 2300 05/29/17 2330 05/30/17 0031 05/30/17 0447  BP: 103/70 121/69 (!) 141/78 124/79  Pulse: 80 (!) 51 (!) 51 65  Resp: (!) 23 (!) 22 20 20   Temp:   97.5 F (36.4 C) 98.2 F (36.8 C)  TempSrc:   Axillary Oral  SpO2: 91% 90% 100% 97%  Weight:   (!) 146.2 kg (322 lb 6.4 oz)   Height:       General: Not in acute distress HEENT:       Eyes: PERRL, EOMI, no scleral icterus.       ENT: No discharge from the  ears and nose, no pharynx injection, no tonsillar enlargement.        Neck: No JVD, no bruit, no mass felt. Heme: No neck lymph node enlargement. Cardiac: S1/S2, RRR, No murmurs, No gallops or rubs. Respiratory: No rales, wheezing, rhonchi or rubs. GI: Soft, nondistended, nontender, no rebound pain, no organomegaly, BS present. GU: No hematuria Ext: has trace leg edema bilaterally. 2+DP/PT pulse bilaterally. Musculoskeletal: No joint deformities, No joint redness or warmth, no limitation of ROM in spin. Skin: No rashes.  Neuro: Alert, oriented X3, cranial nerves II-XII grossly intact, moves all extremities normally. Psych: Patient is not psychotic, no suicidal or hemocidal ideation.  Labs on Admission: I have personally reviewed following labs and imaging studies  CBC:  Recent Labs Lab 05/29/17 1541  WBC 9.3  HGB 14.0  HCT 43.2  MCV 91.5  PLT 242   Basic Metabolic Panel:  Recent Labs Lab 05/29/17 1541  NA 142  K 3.5  CL 106  CO2 26  GLUCOSE 156*  BUN 13  CREATININE 1.28*  CALCIUM 9.0   GFR: Estimated Creatinine Clearance: 82.1 mL/min (A) (by  C-G formula based on SCr of 1.28 mg/dL (H)). Liver Function Tests:  Recent Labs Lab 05/29/17 1541  AST 27  ALT 17  ALKPHOS 75  BILITOT 0.6  PROT 6.6  ALBUMIN 3.6    Recent Labs Lab 05/29/17 1541  LIPASE 25   No results for input(s): AMMONIA in the last 168 hours. Coagulation Profile: No results for input(s): INR, PROTIME in the last 168 hours. Cardiac Enzymes:  Recent Labs Lab 05/29/17 2253  TROPONINI <0.03   BNP (last 3 results) No results for input(s): PROBNP in the last 8760 hours. HbA1C: No results for input(s): HGBA1C in the last 72 hours. CBG: No results for input(s): GLUCAP in the last 168 hours. Lipid Profile: No results for input(s): CHOL, HDL, LDLCALC, TRIG, CHOLHDL, LDLDIRECT in the last 72 hours. Thyroid Function Tests: No results for input(s): TSH, T4TOTAL, FREET4, T3FREE, THYROIDAB in the last 72 hours. Anemia Panel: No results for input(s): VITAMINB12, FOLATE, FERRITIN, TIBC, IRON, RETICCTPCT in the last 72 hours. Urine analysis:    Component Value Date/Time   COLORURINE YELLOW 04/25/2017 2221   APPEARANCEUR CLEAR 04/25/2017 2221   LABSPEC 1.019 04/25/2017 2221   PHURINE 5.0 04/25/2017 2221   GLUCOSEU NEGATIVE 04/25/2017 2221   HGBUR NEGATIVE 04/25/2017 2221   BILIRUBINUR NEGATIVE 04/25/2017 2221   KETONESUR NEGATIVE 04/25/2017 2221   PROTEINUR NEGATIVE 04/25/2017 2221   UROBILINOGEN 0.2 09/21/2015 1557   NITRITE NEGATIVE 04/25/2017 2221   LEUKOCYTESUR NEGATIVE 04/25/2017 2221   Sepsis Labs: @LABRCNTIP (procalcitonin:4,lacticidven:4) )No results found for this or any previous visit (from the past 240 hour(s)).   Radiological Exams on Admission: Dg Chest 2 View  Result Date: 05/29/2017 CLINICAL DATA:  Left chest pain. EXAM: CHEST  2 VIEW COMPARISON:  03/06/2017. FINDINGS: Stable enlarged cardiac silhouette, post CABG changes and left atrial clip. The aorta remains mildly tortuous and calcified. Stable mild prominence of the pulmonary  vasculature and interstitial markings. No pleural fluid. Thoracic spine degenerative changes. IMPRESSION: No acute abnormality. Stable cardiomegaly, mild pulmonary vascular congestion and mild chronic interstitial lung disease. Electronically Signed   By: Beckie Salts M.D.   On: 05/29/2017 16:31     EKG: Independently reviewed.  Not done in ED, will get one.   Assessment/Plan Principal Problem:   Chest pain Active Problems:   Hyperlipidemia   Morbid obesity (HCC)   Paroxysmal atrial fibrillation (HCC)  Chronic systolic heart failure (HCC)   S/P CABG x 4   CKD (chronic kidney disease), stage III   Essential hypertension   Gout   Chest pain and hx of CAD: s/p of CABG. patient is on blood thinner, no sensory DVT, less likely to have a PE. No pneumonia on chest x-ray. Given history of CAD and s/p of CABG, will need to r/o ACS.   -will place on Tele bed for obs - cycle CE q6 x3 and repeat EKG in the am  - prn Nitroglycerin, Morphine, and coreg, lipitor, ASA 81 mg daily - Risk factor stratification: will check FLP, UDS and A1C  - 2d echo - pt is on Eliquis for A fib-->will switch to IV heparin in case pt needs cath - please call Card in AM, Dr. [name]  HLD -lipitor  Atrial Fibrillation: CHA2DS2-VASc Score is 4, needs oral anticoagulation. Patient is on Eliquis at home. Heart rate is 40 to 100.  -switched Eliquis to IV heparin as above -continue coreg and amiodarone  Chronic systolic heart failure (HCC): 2-D echo on 04/27/17 showed EF 40-45%. Patient has trace leg edema, but no JVD. CHF seems to be compensated on admission. -Continue home dose of Lasix 80 mg twice a day, Dennis check BNP  CKD-stage III: stable. Baseline creatinine 1.1-1.2. His creatinine is 1.28, which is close to the baseline. -Follow-up renal function by BMP  HTN: -continue coreg and losartan  Gout: -continue home allopurinol    DVT ppx: On IV Heparin    Code Status: Full code Family Communication:  None at bed side.     Disposition Plan:  Anticipate discharge back to previous home environment Consults called:  none Admission status: Obs / tele     Date of Service 05/30/2017    Lorretta Harp [facility] Pager [phone]  If 7PM-7AM, please contact night-coverage [email or web address] Password [facility] 05/30/2017, 4:49 AM

## 2017-05-29 NOTE — ED Notes (Signed)
Pt given PRN order of nitro for c/o substernal cp

## 2017-05-30 ENCOUNTER — Observation Stay (HOSPITAL_COMMUNITY): Payer: Medicare Other

## 2017-05-30 ENCOUNTER — Encounter: Payer: Self-pay | Admitting: *Deleted

## 2017-05-30 ENCOUNTER — Other Ambulatory Visit: Payer: Self-pay | Admitting: *Deleted

## 2017-05-30 DIAGNOSIS — E785 Hyperlipidemia, unspecified: Secondary | ICD-10-CM | POA: Diagnosis not present

## 2017-05-30 DIAGNOSIS — R072 Precordial pain: Secondary | ICD-10-CM | POA: Diagnosis not present

## 2017-05-30 DIAGNOSIS — R079 Chest pain, unspecified: Secondary | ICD-10-CM | POA: Diagnosis not present

## 2017-05-30 DIAGNOSIS — M109 Gout, unspecified: Secondary | ICD-10-CM

## 2017-05-30 DIAGNOSIS — I5022 Chronic systolic (congestive) heart failure: Secondary | ICD-10-CM | POA: Diagnosis not present

## 2017-05-30 DIAGNOSIS — I10 Essential (primary) hypertension: Secondary | ICD-10-CM

## 2017-05-30 DIAGNOSIS — I480 Paroxysmal atrial fibrillation: Secondary | ICD-10-CM | POA: Diagnosis not present

## 2017-05-30 DIAGNOSIS — I130 Hypertensive heart and chronic kidney disease with heart failure and stage 1 through stage 4 chronic kidney disease, or unspecified chronic kidney disease: Secondary | ICD-10-CM | POA: Diagnosis not present

## 2017-05-30 DIAGNOSIS — I25110 Atherosclerotic heart disease of native coronary artery with unstable angina pectoris: Secondary | ICD-10-CM | POA: Diagnosis not present

## 2017-05-30 DIAGNOSIS — I4891 Unspecified atrial fibrillation: Secondary | ICD-10-CM | POA: Diagnosis not present

## 2017-05-30 DIAGNOSIS — N183 Chronic kidney disease, stage 3 (moderate): Secondary | ICD-10-CM | POA: Diagnosis not present

## 2017-05-30 HISTORY — DX: Gout, unspecified: M10.9

## 2017-05-30 HISTORY — DX: Essential (primary) hypertension: I10

## 2017-05-30 LAB — TROPONIN I

## 2017-05-30 LAB — CBC
HEMATOCRIT: 42.5 % (ref 39.0–52.0)
Hemoglobin: 13.8 g/dL (ref 13.0–17.0)
MCH: 29.7 pg (ref 26.0–34.0)
MCHC: 32.5 g/dL (ref 30.0–36.0)
MCV: 91.4 fL (ref 78.0–100.0)
Platelets: 202 10*3/uL (ref 150–400)
RBC: 4.65 MIL/uL (ref 4.22–5.81)
RDW: 14.5 % (ref 11.5–15.5)
WBC: 9.1 10*3/uL (ref 4.0–10.5)

## 2017-05-30 LAB — APTT
APTT: 65 s — AB (ref 24–36)
aPTT: 78 seconds — ABNORMAL HIGH (ref 24–36)

## 2017-05-30 LAB — LIPID PANEL
Cholesterol: 191 mg/dL (ref 0–200)
HDL: 43 mg/dL (ref >40)
LDL CALC: 99 mg/dL (ref 0–99)
Total CHOL/HDL Ratio: 4.4 RATIO
Triglycerides: 247 mg/dL — ABNORMAL HIGH (ref <150)
VLDL: 49 mg/dL — AB (ref 0–40)

## 2017-05-30 LAB — HEPARIN LEVEL (UNFRACTIONATED)
Heparin Unfractionated: 1.07 IU/mL — ABNORMAL HIGH (ref 0.30–0.70)
Heparin Unfractionated: 0.96 IU/mL — ABNORMAL HIGH (ref 0.30–0.70)

## 2017-05-30 LAB — BRAIN NATRIURETIC PEPTIDE: B NATRIURETIC PEPTIDE 5: 205 pg/mL — AB (ref 0.0–100.0)

## 2017-05-30 MED ORDER — REGADENOSON 0.4 MG/5ML IV SOLN
0.4000 mg | Freq: Once | INTRAVENOUS | Status: AC
  Administered 2017-05-30: 0.4 mg via INTRAVENOUS
  Filled 2017-05-30: qty 5

## 2017-05-30 MED ORDER — ASPIRIN EC 81 MG PO TBEC
81.0000 mg | DELAYED_RELEASE_TABLET | Freq: Every day | ORAL | Status: DC
  Administered 2017-05-31: 81 mg via ORAL
  Filled 2017-05-30 (×2): qty 1

## 2017-05-30 MED ORDER — REGADENOSON 0.4 MG/5ML IV SOLN
INTRAVENOUS | Status: AC
  Filled 2017-05-30: qty 5

## 2017-05-30 NOTE — Progress Notes (Signed)
ANTICOAGULATION CONSULT NOTE Pharmacy Consult for heparin Indication: chest pain/ACS/afib   Allergies  Allergen Reactions  . Niacin And Related Other (See Comments)    FLUSHING  . Xarelto [Rivaroxaban] Other (See Comments)    Cause bloody stools  . Penicillins Itching, Rash and Other (See Comments)    Has patient had a PCN reaction causing immediate rash, facial/tongue/throat swelling, SOB or lightheadedness with hypotension: Yes Has patient had a PCN reaction causing severe rash involving mucus membranes or skin necrosis: No Has patient had a PCN reaction that required hospitalization: No Has patient had a PCN reaction occurring within the last 10 years: No If all of the above answers are "NO", then may proceed with Cephalosporin use.     Patient Measurements: Height: 5\' 10"  (177.8 cm) Weight: (!) 322 lb 6.4 oz (146.2 kg) IBW/kg (Calculated) : 73 Heparin Dosing Weight: 107 kg   Vital Signs: Temp: 97.5 F (36.4 C) (06/26 2017) Temp Source: Oral (06/26 2017) BP: 110/69 (06/26 2017) Pulse Rate: 84 (06/26 2017)  Labs:  Recent Labs  05/29/17 1541 05/29/17 2253 05/30/17 0510 05/30/17 1531 05/30/17 2248  HGB 14.0  --  13.8  --   --   HCT 43.2  --  42.5  --   --   PLT 242  --  202  --   --   APTT  --  30  --  65* 78*  HEPARINUNFRC  --  1.07*  --  0.96*  --   CREATININE 1.28*  --   --   --   --   TROPONINI  --  <0.03 <0.03 <0.03  --     Estimated Creatinine Clearance: 82.1 mL/min (A) (by C-G formula based on SCr of 1.28 mg/dL (H)).   Assessment: 66 y.o. male admitted with chest pain, h/o Afib and Eliquis on hold, for heparin  Goal of Therapy:  Heparin level 0.3-0.7 units/ml aPTT 66-102 seconds Monitor platelets by anticoagulation protocol: Yes   Plan:  Continue Heparin at current rate   Geannie Risen, PharmD, BCPS  05/30/2017 11:29 PM

## 2017-05-30 NOTE — Progress Notes (Signed)
ANTICOAGULATION CONSULT NOTE  Pharmacy Consult for heparin (PTA Eliquis on hold) Indication: chest pain/ACS/afib   Allergies  Allergen Reactions  . Niacin And Related Other (See Comments)    FLUSHING  . Xarelto [Rivaroxaban] Other (See Comments)    Cause bloody stools  . Penicillins Itching, Rash and Other (See Comments)    Has patient had a PCN reaction causing immediate rash, facial/tongue/throat swelling, SOB or lightheadedness with hypotension: Yes Has patient had a PCN reaction causing severe rash involving mucus membranes or skin necrosis: No Has patient had a PCN reaction that required hospitalization: No Has patient had a PCN reaction occurring within the last 10 years: No If all of the above answers are "NO", then may proceed with Cephalosporin use.     Patient Measurements: Height: 5\' 10"  (177.8 cm) Weight: (!) 322 lb 6.4 oz (146.2 kg) IBW/kg (Calculated) : 73 Heparin Dosing Weight: 107 kg   Vital Signs: Temp: 97.8 F (36.6 C) (06/26 1405) Temp Source: Oral (06/26 1405) BP: 111/65 (06/26 1405) Pulse Rate: 92 (06/26 1405)  Labs:  Recent Labs  05/29/17 1541 05/29/17 2253 05/30/17 0510 05/30/17 1531  HGB 14.0  --  13.8  --   HCT 43.2  --  42.5  --   PLT 242  --  202  --   APTT  --  30  --  65*  HEPARINUNFRC  --  1.07*  --  0.96*  CREATININE 1.28*  --   --   --   TROPONINI  --  <0.03 <0.03  --     Estimated Creatinine Clearance: 82.1 mL/min (A) (by C-G formula based on SCr of 1.28 mg/dL (H)).   Assessment: 66 yo male admitted with chest pain. Pharmacy consulted to dose heparin. History of afib on Eliquis, with last dose at 0900 on 6/25. CBC stable and no s/s bleeding noted.   First aPTT just below goal at 65 seconds, heparin level elevated as expected to 0.96 units/mL. No bleeding noted, CBC stable. Delay in collecting labs this morning- drawn at 1530 this afternoon.  Goal of Therapy:  Heparin level 0.3-0.7 units/ml aPTT 66-102 seconds Monitor  platelets by anticoagulation protocol: Yes   Plan:  Increase heparin to 1650 units/hr  aPTT in 6 hours  Daily heparin level and aPTT until correlating, continue daily CBC Monitor for s/s bleeding   Iley Deignan D. Eural Holzschuh, PharmD, BCPS Clinical Pharmacist [phone] 05/30/2017 4:10 PM

## 2017-05-30 NOTE — Progress Notes (Signed)
   05/30/17 1005  Clinical Encounter Type  Visited With Patient not available  Visit Type Other (Comment) ([place] consult)  Spiritual Encounters  Spiritual Needs Brochure  Stress Factors  Patient Stress Factors Not reviewed  Pt not in room. Follow up later.

## 2017-05-30 NOTE — Consult Note (Signed)
Referring Physician:  GEOFF DACANAY is an 66 y.o. male.                       Chief Complaint: Chest pain  HPI: 66 year old male with CAD, CABG, hypertension, hyperlipidemia, Obesity, gout and CHF has recurrent chest pain.  Past Medical History:  Diagnosis Date  . Anxiety   . CHF (congestive heart failure) ([place])   . Dysrhythmia    ATRIAL FIBRILATION, resolved with cardioversion  . GERD (gastroesophageal reflux disease)   . Hyperlipidemia   . Hypertension   . Hypertensive heart disease   . Kidney stones   . Obesity (BMI 30-39.9)   . Osteoarthritis   . PONV (postoperative nausea and vomiting)   . Shortness of breath dyspnea       Past Surgical History:  Procedure Laterality Date  . CARDIAC CATHETERIZATION N/A 04/09/2015   Procedure: Right/Left Heart Cath and Coronary Angiography;  Surgeon: Dixie Dials, MD;  Location: [place] INVASIVE CV LAB CUPID;  Service: Cardiovascular;  Laterality: N/A;  . CARDIOVERSION N/A 01/19/2015   Procedure: CARDIOVERSION;  Surgeon: Jacolyn Reedy, MD;  Location: [facility] ENDOSCOPY;  Service: Cardiovascular;  Laterality: N/A;  pt in Afib, 12:22 synched cardioversion @  120 joules using Propofol 100 mg,IV....unsuccessful, repeated at 200 joules  . CARDIOVERSION N/A 04/02/2015   Procedure: CARDIOVERSION;  Surgeon: Dixie Dials, MD;  Location: [place];  Service: Cardiovascular;  Laterality: N/A;  . CATARACT EXTRACTION    . CLIPPING OF ATRIAL APPENDAGE  04/13/2015   Procedure: CLIPPING OF ATRIAL APPENDAGE;  Surgeon: Gaye Pollack, MD;  Location: [place] OR;  Service: Open Heart Surgery;;  . CORONARY ARTERY BYPASS GRAFT N/A 04/13/2015   Procedure: CORONARY ARTERY BYPASS GRAFTING (CABG);  Surgeon: Gaye Pollack, MD;  Location: [place];  Service: Open Heart Surgery;  Laterality: N/A;  . TEE WITHOUT CARDIOVERSION N/A 04/13/2015   Procedure: TRANSESOPHAGEAL ECHOCARDIOGRAM (TEE);  Surgeon: Gaye Pollack, MD;  Location: [place];  Service: Open Heart Surgery;  Laterality: N/A;     Family History  Problem Relation Age of Onset  . Diabetes Mother   . Heart disease Father   . Congestive Heart Failure Sister   . Stroke Brother   . Heart disease Brother   . Diabetes Brother    Social History:  reports that he quit smoking about 18 years ago. He has never used smokeless tobacco. He reports that he does not drink alcohol or use drugs.  Allergies:  Allergies  Allergen Reactions  . Niacin And Related Other (See Comments)    FLUSHING  . Xarelto [Rivaroxaban] Other (See Comments)    Cause bloody stools  . Penicillins Itching, Rash and Other (See Comments)    Has patient had a PCN reaction causing immediate rash, facial/tongue/throat swelling, SOB or lightheadedness with hypotension: Yes Has patient had a PCN reaction causing severe rash involving mucus membranes or skin necrosis: No Has patient had a PCN reaction that required hospitalization: No Has patient had a PCN reaction occurring within the last 10 years: No If all of the above answers are "NO", then may proceed with Cephalosporin use.     Medications Prior to Admission  Medication Sig Dispense Refill  . acetaminophen (TYLENOL) 500 MG tablet Take 1,000 mg by mouth every 6 (six) hours as needed for moderate pain. Reported on 03/11/2016    . allopurinol (ZYLOPRIM) 100 MG tablet TAKE 1 TABLET(100 MG) BY MOUTH DAILY 90 tablet 0  .  amiodarone (PACERONE) 200 MG tablet TAKE 1 TABLET(200 MG) BY MOUTH DAILY 90 tablet 0  . apixaban (ELIQUIS) 2.5 MG TABS tablet Take 2.5 mg by mouth 2 (two) times daily.     Marland Kitchen atorvastatin (LIPITOR) 40 MG tablet Take 1 tablet (40 mg total) by mouth daily at 6 PM. (Patient taking differently: Take 40 mg by mouth daily. ) 30 tablet 0  . carvedilol (COREG) 6.25 MG tablet TAKE 1 TABLET(6.25 MG) BY MOUTH TWICE DAILY WITH A MEAL 180 tablet 0  . Cholecalciferol (VITAMIN D PO) Take 1 tablet by mouth daily.    . fluticasone (FLONASE) 50 MCG/ACT nasal spray Place 1 spray into both nostrils daily.     . furosemide (LASIX) 80 MG tablet Take 80 mg by mouth 2 (two) times daily.    Marland Kitchen losartan (COZAAR) 25 MG tablet Take 1 tablet (25 mg total) by mouth daily. 30 tablet 0  . nitroGLYCERIN (NITROSTAT) 0.4 MG SL tablet Place 0.4 mg under the tongue every 5 (five) minutes as needed for chest pain.    . potassium chloride SA (K-DUR,KLOR-CON) 20 MEQ tablet Take 2 tablets (40 mEq total) by mouth daily. (Patient taking differently: Take 20 mEq by mouth 2 (two) times daily. ) 90 tablet 3  . PROAIR HFA 108 (90 Base) MCG/ACT inhaler INHALE 1 PUFF INTO THE LUNGS EVERY 6 HOURS AS NEEDED FOR WHEEZING OR SHORTNESS OF BREATH 8.5 g 0  . rivaroxaban (XARELTO) 20 MG TABS tablet Take 1 tablet (20 mg total) by mouth daily with supper. (Patient not taking: Reported on 05/17/2017) 30 tablet     Results for orders placed or performed during the hospital encounter of 05/29/17 (from the past 48 hour(s))  Basic metabolic panel     Status: Abnormal   Collection Time: 05/29/17  3:41 PM  Result Value Ref Range   Sodium 142 135 - 145 mmol/L   Potassium 3.5 3.5 - 5.1 mmol/L   Chloride 106 101 - 111 mmol/L   CO2 26 22 - 32 mmol/L   Glucose, Bld 156 (H) 65 - 99 mg/dL   BUN 13 6 - 20 mg/dL   Creatinine, Ser 1.28 (H) 0.61 - 1.24 mg/dL   Calcium 9.0 8.9 - 10.3 mg/dL   GFR calc non Af Amer 57 (L) >60 mL/min   GFR calc Af Amer >60 >60 mL/min    Comment: (NOTE) The eGFR has been calculated using the CKD EPI equation. This calculation has not been validated in all clinical situations. eGFR's persistently <60 mL/min signify possible Chronic Kidney Disease.    Anion gap 10 5 - 15  CBC     Status: None   Collection Time: 05/29/17  3:41 PM  Result Value Ref Range   WBC 9.3 4.0 - 10.5 K/uL   RBC 4.72 4.22 - 5.81 MIL/uL   Hemoglobin 14.0 13.0 - 17.0 g/dL   HCT 43.2 39.0 - 52.0 %   MCV 91.5 78.0 - 100.0 fL   MCH 29.7 26.0 - 34.0 pg   MCHC 32.4 30.0 - 36.0 g/dL   RDW 14.4 11.5 - 15.5 %   Platelets 242 150 - 400 K/uL   Hepatic function panel     Status: None   Collection Time: 05/29/17  3:41 PM  Result Value Ref Range   Total Protein 6.6 6.5 - 8.1 g/dL   Albumin 3.6 3.5 - 5.0 g/dL   AST 27 15 - 41 U/L   ALT 17 17 - 63 U/L   Alkaline  Phosphatase 75 38 - 126 U/L   Total Bilirubin 0.6 0.3 - 1.2 mg/dL   Bilirubin, Direct 0.1 0.1 - 0.5 mg/dL   Indirect Bilirubin 0.5 0.3 - 0.9 mg/dL  Lipase, blood     Status: None   Collection Time: 05/29/17  3:41 PM  Result Value Ref Range   Lipase 25 11 - 51 U/L  I-stat troponin, ED     Status: None   Collection Time: 05/29/17  9:07 PM  Result Value Ref Range   Troponin i, poc 0.00 0.00 - 0.08 ng/mL   Comment 3            Comment: Due to the release kinetics of cTnI, a negative result within the first hours of the onset of symptoms does not rule out myocardial infarction with certainty. If myocardial infarction is still suspected, repeat the test at appropriate intervals.   Troponin I (q 6hr x 3)     Status: None   Collection Time: 05/29/17 10:53 PM  Result Value Ref Range   Troponin I <0.03 <0.03 ng/mL  APTT     Status: None   Collection Time: 05/29/17 10:53 PM  Result Value Ref Range   aPTT 30 24 - 36 seconds  Heparin level (unfractionated)     Status: Abnormal   Collection Time: 05/29/17 10:53 PM  Result Value Ref Range   Heparin Unfractionated 1.07 (H) 0.30 - 0.70 IU/mL    Comment:        IF HEPARIN RESULTS ARE BELOW EXPECTED VALUES, AND PATIENT DOSAGE HAS BEEN CONFIRMED, SUGGEST FOLLOW UP TESTING OF ANTITHROMBIN III LEVELS.   CBC     Status: None   Collection Time: 05/30/17  5:10 AM  Result Value Ref Range   WBC 9.1 4.0 - 10.5 K/uL   RBC 4.65 4.22 - 5.81 MIL/uL   Hemoglobin 13.8 13.0 - 17.0 g/dL   HCT 42.5 39.0 - 52.0 %   MCV 91.4 78.0 - 100.0 fL   MCH 29.7 26.0 - 34.0 pg   MCHC 32.5 30.0 - 36.0 g/dL   RDW 14.5 11.5 - 15.5 %   Platelets 202 150 - 400 K/uL  Lipid panel     Status: Abnormal   Collection Time: 05/30/17  5:10 AM  Result  Value Ref Range   Cholesterol 191 0 - 200 mg/dL   Triglycerides 247 (H) <150 mg/dL   HDL 43 >40 mg/dL   Total CHOL/HDL Ratio 4.4 RATIO   VLDL 49 (H) 0 - 40 mg/dL   LDL Cholesterol 99 0 - 99 mg/dL    Comment:        Total Cholesterol/HDL:CHD Risk Coronary Heart Disease Risk Table                     Men   Women  1/2 Average Risk   3.4   3.3  Average Risk       5.0   4.4  2 X Average Risk   9.6   7.1  3 X Average Risk  23.4   11.0        Use the calculated Patient Ratio above and the CHD Risk Table to determine the patient's CHD Risk.        ATP III CLASSIFICATION (LDL):  <100     mg/dL   Optimal  100-129  mg/dL   Near or Above                    Optimal  130-159  mg/dL   Borderline  160-189  mg/dL   High  >190     mg/dL   Very High   Troponin I     Status: None   Collection Time: 05/30/17  5:10 AM  Result Value Ref Range   Troponin I <0.03 <0.03 ng/mL  Brain natriuretic peptide     Status: Abnormal   Collection Time: 05/30/17  5:10 AM  Result Value Ref Range   B Natriuretic Peptide 205.0 (H) 0.0 - 100.0 pg/mL  Heparin level (unfractionated)     Status: Abnormal   Collection Time: 05/30/17  3:31 PM  Result Value Ref Range   Heparin Unfractionated 0.96 (H) 0.30 - 0.70 IU/mL    Comment:        IF HEPARIN RESULTS ARE BELOW EXPECTED VALUES, AND PATIENT DOSAGE HAS BEEN CONFIRMED, SUGGEST FOLLOW UP TESTING OF ANTITHROMBIN III LEVELS.   Troponin I     Status: None   Collection Time: 05/30/17  3:31 PM  Result Value Ref Range   Troponin I <0.03 <0.03 ng/mL  APTT     Status: Abnormal   Collection Time: 05/30/17  3:31 PM  Result Value Ref Range   aPTT 65 (H) 24 - 36 seconds    Comment:        IF BASELINE aPTT IS ELEVATED, SUGGEST PATIENT RISK ASSESSMENT BE USED TO DETERMINE APPROPRIATE ANTICOAGULANT THERAPY.    Dg Chest 2 View  Result Date: 05/29/2017 CLINICAL DATA:  Left chest pain. EXAM: CHEST  2 VIEW COMPARISON:  03/06/2017. FINDINGS: Stable enlarged cardiac  silhouette, post CABG changes and left atrial clip. The aorta remains mildly tortuous and calcified. Stable mild prominence of the pulmonary vasculature and interstitial markings. No pleural fluid. Thoracic spine degenerative changes. IMPRESSION: No acute abnormality. Stable cardiomegaly, mild pulmonary vascular congestion and mild chronic interstitial lung disease. Electronically Signed   By: Claudie Revering M.D.   On: 05/29/2017 16:31    Review Of Systems Constitutional: No fever, chills, weight loss or gain. Eyes: Positive vision change, wears glasses. No discharge or pain. Ears: No hearing loss, No tinnitus. Respiratory: No asthma, COPD, pneumonias. Positive shortness of breath. No hemoptysis. Cardiovascular: Positive chest pain, palpitation, leg edema. Gastrointestinal: No nausea, vomiting, diarrhea, constipation. No GI bleed. No hepatitis. Genitourinary: No dysuria, hematuria, kidney stone. No incontinance. Neurological: Positive headache, no stroke, seizures.  Psychiatry: No psych facility admission for anxiety, depression, suicide. No detox. Skin: No rash. Musculoskeletal: Positive joint pain, no fibromyalgia. Positive neck pain, back pain. Lymphadenopathy: No lymphadenopathy. Hematology: No anemia or easy bruising.   Blood pressure 111/65, pulse 92, temperature 97.8 F (36.6 C), temperature source Oral, resp. rate 18, height _0  (1.778 m), weight (!) 146.2 kg (322 lb 6.4 oz), SpO2 95 %. Body mass index is 46.26 kg/m. General appearance: alert, cooperative, appears stated age and no distress Head: Normocephalic, atraumatic. Eyes: Hazel eyes, pink conjunctiva, corneas clear. PERRL, EOM's intact. Neck: No adenopathy, no carotid bruit, no JVD, supple, symmetrical, trachea midline and thyroid not enlarged. Resp: Clear to auscultation bilaterally. Cardio: Regular rate and rhythm, S1, S2 normal, II/VI systolic murmur, no click, rub or gallop GI: Soft, non-tender; bowel sounds normal; no  organomegaly. Extremities: Trace edema, cyanosis or clubbing. Skin: Warm and dry.  Neurologic: Alert and oriented X 3, normal strength. Normal coordination.  Assessment/Plan Chest pain CAD CABG Hypertension Paroxysmal atrial fibrillation, CHA2DS2VASc score 4/9 Morbid obesity Gout Back pain Arthritis  Agree with r/o MI. Nuclear stress test, 2 day protocol  due to obesity.  Birdie Riddle, MD  05/30/2017, 7:24 PM

## 2017-05-30 NOTE — Progress Notes (Signed)
PROGRESS NOTE    MICAJAH Mckinney  TDV:[id number] DOB: 23-May-1951 DOA: 05/29/2017 PCP: [name], MD   Brief Narrative:  Charles Mckinney is a 66 y.o. male with medical history significant of hypertension, hyperlipidemia, GERD, gout, obesity, sCHF with EF 40%, PAF on Eliquis, CAD, s/p of CABG, CKD-2, gout, anxiety, presents with chest pain.  Assessment & Plan:   Principal Problem:   Chest pain Active Problems:   Hyperlipidemia   Morbid obesity (HCC)   Paroxysmal atrial fibrillation (HCC)   Chronic systolic heart failure (HCC)   S/P CABG x 4   CKD (chronic kidney disease), stage III   Essential hypertension   Gout   Chest pain: at rest. Differential include GERD. Had CABG two years ago Negative enzymes. Echo pending.  Stress test ordered by cardiology. Second part of stress pending, scheduled for tomorrow.  Currently chest pain free.  Seen by Dr [name]. On IV heparin.    Paroxysmal atrial fib, was on eliquis at home, currently on IV heparin. Rate control with amiodarone.   Chronic systolic heart failure: he appears euvolemic. CXR mild pulm vascular congestion.  Resume home dose of lasix.    Stage 3 CKD:  Creatinine close to baseline.   H/o CAD S/P CABG IN 2016.  Resume aspirin, coreg, lipitor and losartan.   Hypertension; well controlled resume home meds.    DVT prophylaxis: heparin gtt.  Code Status: full code.  Family Communication: none at bedside, discussed the plan of care with the patient.  Disposition Plan: pending stress test in am.    Consultants:   Cardiology.   Procedures: stress test.  Antimicrobials: none.   Subjective: Currently chest pain,  No nausea, or vomiting.   Objective: Vitals:   05/30/17 1049 05/30/17 1051 05/30/17 1052 05/30/17 1405  BP: (!) 153/95 (!) 168/98  111/65  Pulse: 96 95 90 92  Resp:    18  Temp:    97.8 F (36.6 C)  TempSrc:    Oral  SpO2:    95%  Weight:      Height:        Intake/Output Summary  (Last 24 hours) at 05/30/17 1724 Last data filed at 05/30/17 0300  Gross per 24 hour  Intake            51.15 ml  Output              925 ml  Net          -873.85 ml   Filed Weights   05/29/17 1528 05/30/17 0031  Weight: (!) 144.2 kg (318 lb) (!) 146.2 kg (322 lb 6.4 oz)    Examination:  General exam: Appears calm and comfortable  Respiratory system: Clear to auscultation. Respiratory effort normal. Cardiovascular system: S1 & S2 heard, RRR. No JVD, murmurs, rubs, gallops or clicks.  Gastrointestinal system: Abdomen is nondistended, soft and nontender. No organomegaly or masses felt. Normal bowel sounds heard. Central nervous system: Alert and oriented. No focal neurological deficits. Extremities: Symmetric 5 x 5 power. Trace pedal edema.  Skin: No rashes, lesions or ulcers Psychiatry: Judgement and insight appear normal. Mood & affect appropriate.     Data Reviewed: I have personally reviewed following labs and imaging studies  CBC:  Recent Labs Lab 05/29/17 1541 05/30/17 0510  WBC 9.3 9.1  HGB 14.0 13.8  HCT 43.2 42.5  MCV 91.5 91.4  PLT 242 202   Basic Metabolic Panel:  Recent Labs Lab 05/29/17 1541  NA 142  K 3.5  CL 106  CO2 26  GLUCOSE 156*  BUN 13  CREATININE 1.28*  CALCIUM 9.0   GFR: Estimated Creatinine Clearance: 82.1 mL/min (A) (by C-G formula based on SCr of 1.28 mg/dL (H)). Liver Function Tests:  Recent Labs Lab 05/29/17 1541  AST 27  ALT 17  ALKPHOS 75  BILITOT 0.6  PROT 6.6  ALBUMIN 3.6    Recent Labs Lab 05/29/17 1541  LIPASE 25   No results for input(s): AMMONIA in the last 168 hours. Coagulation Profile: No results for input(s): INR, PROTIME in the last 168 hours. Cardiac Enzymes:  Recent Labs Lab 05/29/17 2253 05/30/17 0510 05/30/17 1531  TROPONINI <0.03 <0.03 <0.03   BNP (last 3 results) No results for input(s): PROBNP in the last 8760 hours. HbA1C: No results for input(s): HGBA1C in the last 72  hours. CBG: No results for input(s): GLUCAP in the last 168 hours. Lipid Profile:  Recent Labs  05/30/17 0510  CHOL 191  HDL 43  LDLCALC 99  TRIG 247*  CHOLHDL 4.4   Thyroid Function Tests: No results for input(s): TSH, T4TOTAL, FREET4, T3FREE, THYROIDAB in the last 72 hours. Anemia Panel: No results for input(s): VITAMINB12, FOLATE, FERRITIN, TIBC, IRON, RETICCTPCT in the last 72 hours. Sepsis Labs: No results for input(s): PROCALCITON, LATICACIDVEN in the last 168 hours.  No results found for this or any previous visit (from the past 240 hour(s)).       Radiology Studies: Dg Chest 2 View  Result Date: 05/29/2017 CLINICAL DATA:  Left chest pain. EXAM: CHEST  2 VIEW COMPARISON:  03/06/2017. FINDINGS: Stable enlarged cardiac silhouette, post CABG changes and left atrial clip. The aorta remains mildly tortuous and calcified. Stable mild prominence of the pulmonary vasculature and interstitial markings. No pleural fluid. Thoracic spine degenerative changes. IMPRESSION: No acute abnormality. Stable cardiomegaly, mild pulmonary vascular congestion and mild chronic interstitial lung disease. Electronically Signed   By: Beckie Salts M.D.   On: 05/29/2017 16:31        Scheduled Meds: . allopurinol  100 mg Oral Daily  . amiodarone  200 mg Oral Daily  . aspirin EC  81 mg Oral Daily  . atorvastatin  40 mg Oral q1800  . carvedilol  6.25 mg Oral BID WC  . cholecalciferol  1,000 Units Oral Daily  . fluticasone  1 spray Each Nare Daily  . furosemide  80 mg Oral BID  . losartan  25 mg Oral Daily   Continuous Infusions: . heparin 1,650 Units/hr (05/30/17 1637)     LOS: 0 days    Time spent: 35 minutes.     Kathlen Mody, MD [facility] Pager [phone]   If 7PM-7AM, please contact night-coverage [email or web address] Password [facility] 05/30/2017, 5:24 PM

## 2017-05-30 NOTE — Progress Notes (Signed)
Pt arrived to 2w29 from ED. VSS. No c/o chest pain. Pt updated on plan of care. Oriented to room and call light. Will continue to monitor.  Margarito Liner, RN

## 2017-05-30 NOTE — Consult Note (Addendum)
   [facility] Inpatient Consult   05/30/2017  Charles Mckinney 27-Apr-1951 [id number]    Made aware of hospitalization by [facility] RNCM. [name] is active with [facility] Care Management program. Please see chart review tab then encounter for further patient outreach details.   Spoke with [name] at bedside. He reports he is supposed to have another procedure tomorrow. He also endorses he has completed his Advance Directives. States he feels good about completing it.  Discussed ongoing [facility] Care Management follow up. He is agreeable. Made [name] aware that [facility] RNCM will follow up with him post discharge.  Made inpatient RNCM aware of above.  [name], MSN-Ed, RN,BSN [facility] Liaison [phone]

## 2017-05-30 NOTE — Progress Notes (Signed)
   05/30/17 1305  Clinical Encounter Type  Visited With Patient  Visit Type Follow-up  Spiritual Encounters  Spiritual Needs Brochure  Stress Factors  Patient Stress Factors Health changes  Finished AD with Pt. Notarized. Original to Pt and copy to chart.

## 2017-05-30 NOTE — Patient Outreach (Signed)
[facility] Customer service manager [facility]) Care Management [facility] Community CM Telephone [facility] Coordination  05/30/2017  Charles Mckinney Sep 09, 1951 [id number]  Care Coordination outreach re: Charles Mckinney, 65 y.o.maleref[facility] to [facility] CM for transition of care after recent hospitalization May 22-24, 2018 for TIA/ gait imbalance. Patient has history including, but not limited to, CHF, CAD with CABG x 4 in 2016, paroxysmal Atrial fibrillation, HTN, HLD, and morbid obesity.  Secure communication via EMR sent to [facility] RN [facility] Liaisons [place]  and [place], notifying of patient's hospital admission (observation status) yesterday for chest pain.  Plan:  [facility] Community RN CM will follow patient's progress while hospitalized and collaborate with [facility] liaison's for discharge disposition.  [name], RN, BSN, [employer] [facility] Care Management  [phone]

## 2017-05-31 ENCOUNTER — Observation Stay (HOSPITAL_BASED_OUTPATIENT_CLINIC_OR_DEPARTMENT_OTHER): Payer: Medicare Other

## 2017-05-31 ENCOUNTER — Observation Stay (HOSPITAL_COMMUNITY): Payer: Medicare Other

## 2017-05-31 DIAGNOSIS — Z951 Presence of aortocoronary bypass graft: Secondary | ICD-10-CM | POA: Diagnosis not present

## 2017-05-31 DIAGNOSIS — I340 Nonrheumatic mitral (valve) insufficiency: Secondary | ICD-10-CM | POA: Diagnosis not present

## 2017-05-31 DIAGNOSIS — I5022 Chronic systolic (congestive) heart failure: Secondary | ICD-10-CM | POA: Diagnosis not present

## 2017-05-31 DIAGNOSIS — I480 Paroxysmal atrial fibrillation: Secondary | ICD-10-CM | POA: Diagnosis not present

## 2017-05-31 DIAGNOSIS — R079 Chest pain, unspecified: Secondary | ICD-10-CM | POA: Diagnosis not present

## 2017-05-31 DIAGNOSIS — I10 Essential (primary) hypertension: Secondary | ICD-10-CM | POA: Diagnosis not present

## 2017-05-31 DIAGNOSIS — I509 Heart failure, unspecified: Secondary | ICD-10-CM | POA: Diagnosis not present

## 2017-05-31 LAB — BASIC METABOLIC PANEL
Anion gap: 12 (ref 5–15)
BUN: 16 mg/dL (ref 6–20)
CHLORIDE: 102 mmol/L (ref 101–111)
CO2: 24 mmol/L (ref 22–32)
CREATININE: 1.11 mg/dL (ref 0.61–1.24)
Calcium: 8.8 mg/dL — ABNORMAL LOW (ref 8.9–10.3)
GFR calc Af Amer: >60 mL/min (ref >60)
GFR calc non Af Amer: >60 mL/min (ref >60)
GLUCOSE: 113 mg/dL — AB (ref 65–99)
POTASSIUM: 3.3 mmol/L — AB (ref 3.5–5.1)
SODIUM: 138 mmol/L (ref 135–145)

## 2017-05-31 LAB — HEMOGLOBIN A1C
HEMOGLOBIN A1C: 6 % — AB (ref 4.8–5.6)
MEAN PLASMA GLUCOSE: 126 mg/dL

## 2017-05-31 LAB — CBC
HEMATOCRIT: 41.3 % (ref 39.0–52.0)
HEMOGLOBIN: 13.2 g/dL (ref 13.0–17.0)
MCH: 29.4 pg (ref 26.0–34.0)
MCHC: 32 g/dL (ref 30.0–36.0)
MCV: 92 fL (ref 78.0–100.0)
Platelets: 199 10*3/uL (ref 150–400)
RBC: 4.49 MIL/uL (ref 4.22–5.81)
RDW: 14.7 % (ref 11.5–15.5)
WBC: 8.4 10*3/uL (ref 4.0–10.5)

## 2017-05-31 LAB — RAPID URINE DRUG SCREEN, HOSP PERFORMED
Amphetamines: NOT DETECTED
BARBITURATES: NOT DETECTED
Benzodiazepines: NOT DETECTED
Cocaine: NOT DETECTED
Opiates: NOT DETECTED
Tetrahydrocannabinol: NOT DETECTED

## 2017-05-31 LAB — ECHOCARDIOGRAM COMPLETE
HEIGHTINCHES: 70 in
WEIGHTICAEL: 5158.4 [oz_av]

## 2017-05-31 LAB — MAGNESIUM: MAGNESIUM: 1.9 mg/dL (ref 1.7–2.4)

## 2017-05-31 LAB — HEPARIN LEVEL (UNFRACTIONATED): Heparin Unfractionated: 0.7 IU/mL (ref 0.30–0.70)

## 2017-05-31 LAB — APTT: APTT: 78 s — AB (ref 24–36)

## 2017-05-31 MED ORDER — TECHNETIUM TC 99M TETROFOSMIN IV KIT
30.0000 | PACK | Freq: Once | INTRAVENOUS | Status: AC | PRN
  Administered 2017-05-31: 30 via INTRAVENOUS

## 2017-05-31 MED ORDER — POTASSIUM CHLORIDE CRYS ER 20 MEQ PO TBCR
40.0000 meq | EXTENDED_RELEASE_TABLET | Freq: Once | ORAL | Status: AC
  Administered 2017-05-31: 40 meq via ORAL
  Filled 2017-05-31: qty 2

## 2017-05-31 MED ORDER — POTASSIUM CHLORIDE CRYS ER 20 MEQ PO TBCR: 20.0000 meq | EXTENDED_RELEASE_TABLET | Freq: Two times a day (BID) | ORAL | Status: DC

## 2017-05-31 MED ORDER — APIXABAN 2.5 MG PO TABS
2.5000 mg | ORAL_TABLET | Freq: Two times a day (BID) | ORAL | Status: DC
  Administered 2017-05-31: 2.5 mg via ORAL
  Filled 2017-05-31: qty 1

## 2017-05-31 NOTE — Discharge Summary (Signed)
Physician Discharge Summary  Charles Mckinney LKJ:[id number] DOB: [birth year] DOA: 05/29/2017  PCP: Charles Savers, MD  Admit date: 05/29/2017 Discharge date: 05/31/2017  Time spent: 65 minutes  Recommendations for Outpatient Follow-up:  1. Follow-up with Charles Savers, MD in 2-3 weeks. On follow-up patient in need a basic metabolic profile done to follow-up on electrolytes and renal function. 2. Follow-up with [name] in 1 week.   Discharge Diagnoses:  Principal Problem:   Chest pain Active Problems:   Hyperlipidemia   Morbid obesity (HCC)   Paroxysmal atrial fibrillation (HCC)   Chronic systolic heart failure (HCC)   S/P CABG x 4   CKD (chronic kidney disease), stage III   Essential hypertension   Gout   Discharge Condition: Stable and improved  Diet recommendation: Heart healthy  Filed Weights   05/29/17 1528 05/30/17 0031  Weight: (!) 144.2 kg (318 lb) (!) 146.2 kg (322 lb 6.4 oz)    History of present illness:  Per Dr [name] is a 66 y.o. male with medical history significant of hypertension, hyperlipidemia, GERD, gout, obesity, sCHF with EF 40%, PAF on Eliquis, CAD, s/p of CABG, CKD-2, gout, anxiety, who presented with chest pain.  Patient stated that his chest pain started the morning of admission. It was located in the right side of chest, constant, 8 out of 10 in severity, pressure-like, nonradiating. It is associated SOB only on exertion, no cough, fever or chills. Patient denied tenderness in the calf areas. No recent long distant traveling. Denied nausea, vomiting, diarrhea, abdominal pain, symptoms of UTI or unilateral weakneds.  ED Course: pt was found to have troponin negative, WBC 9.3, lipase 25, stable renal function, temperature normal, oxygen saturation 92-95% on room air, chest x-ray showed mild vascular congestion. Patient was placed on telemetry bed for observation.   Hospital Course:  #1 chest pain Patient had presented  with chest pain and seen in consultation by cardiology. Cardiac enzymes which was cycled were negative 3. Patient was seen in consultation by Dr. Algie Mckinney followed the patient throughout the hospitalization. 2-D echo which was done showed mild to moderately dilated LV and mild LV hypertrophy. EF of 30-35% with diffuse hypokinesis. Myoview stress test which was done showed no evidence of reversible myocardial ischemia. Left ventricle hypokinesis and moderate left ventricular dilatation. EF of 27%. Per cardiology patient was a well poor LV systolic function in need of diet restrictions and fluid intake. Patient had no further chest pain. Was recommended per cardiology to continue medical management.  #2 paroxysmal atrial fibrillation Remained rate controlled on amiodarone. Patient was maintained on her liquids for anticoagulation.  #3 chronic systolic heart failure Euvolemic throughout the hospitalization. [name] home dose Lasix was resumed.  #4 chronic kidney disease stage III Stable.  The rest of patient's chronic medical issues remained stable throughout the hospitalization patient be discharged in stable and improved condition.  Procedures:  Chest x-ray 05/29/2017  Myoview stress test 05/31/2017  2-D echo 05/31/2017  Consultations:  Cardiology: Dr. Algie Mckinney 05/30/2017  Discharge Exam: Vitals:   05/31/17 1428 05/31/17 1729  BP: (!) 96/54 (!) 114/92  Pulse: 75 74  Resp: 18   Temp: 97.9 F (36.6 C)     General: NAD Cardiovascular: RRR Respiratory: CTAB  Discharge Instructions   Discharge Instructions    Diet - low sodium heart healthy    Complete by:  As directed    Increase activity slowly    Complete by:  As directed  Current Discharge Medication List    CONTINUE these medications which have CHANGED   Details  potassium chloride SA (K-DUR,KLOR-CON) 20 MEQ tablet Take 1 tablet (20 mEq total) by mouth 2 (two) times daily.   Associated Diagnoses: Chronic  systolic CHF (congestive heart failure) (HCC)      CONTINUE these medications which have NOT CHANGED   Details  acetaminophen (TYLENOL) 500 MG tablet Take 1,000 mg by mouth every 6 (six) hours as needed for moderate pain. Reported on 03/11/2016    allopurinol (ZYLOPRIM) 100 MG tablet TAKE 1 TABLET(100 MG) BY MOUTH DAILY Qty: 90 tablet, Refills: 0    amiodarone (PACERONE) 200 MG tablet TAKE 1 TABLET(200 MG) BY MOUTH DAILY Qty: 90 tablet, Refills: 0    apixaban (ELIQUIS) 2.5 MG TABS tablet Take 2.5 mg by mouth 2 (two) times daily.     atorvastatin (LIPITOR) 40 MG tablet Take 1 tablet (40 mg total) by mouth daily at 6 PM. Qty: 30 tablet, Refills: 0    carvedilol (COREG) 6.25 MG tablet TAKE 1 TABLET(6.25 MG) BY MOUTH TWICE DAILY WITH A MEAL Qty: 180 tablet, Refills: 0   Associated Diagnoses: Chronic systolic CHF (congestive heart failure) (HCC)    Cholecalciferol (VITAMIN D PO) Take 1 tablet by mouth daily.    fluticasone (FLONASE) 50 MCG/ACT nasal spray Place 1 spray into both nostrils daily.    furosemide (LASIX) 80 MG tablet Take 80 mg by mouth 2 (two) times daily.    losartan (COZAAR) 25 MG tablet Take 1 tablet (25 mg total) by mouth daily. Qty: 30 tablet, Refills: 0    nitroGLYCERIN (NITROSTAT) 0.4 MG SL tablet Place 0.4 mg under the tongue every 5 (five) minutes as needed for chest pain.    PROAIR HFA 108 (90 Base) MCG/ACT inhaler INHALE 1 PUFF INTO THE LUNGS EVERY 6 HOURS AS NEEDED FOR WHEEZING OR SHORTNESS OF BREATH Qty: 8.5 g, Refills: 0      STOP taking these medications     rivaroxaban (XARELTO) 20 MG TABS tablet        Allergies  Allergen Reactions  . Niacin And Related Other (See Comments)    FLUSHING  . Xarelto [Rivaroxaban] Other (See Comments)    Cause bloody stools  . Penicillins Itching, Rash and Other (See Comments)    Has patient had a PCN reaction causing immediate rash, facial/tongue/throat swelling, SOB or lightheadedness with hypotension: Yes Has  patient had a PCN reaction causing severe rash involving mucus membranes or skin necrosis: No Has patient had a PCN reaction that required hospitalization: No Has patient had a PCN reaction occurring within the last 10 years: No If all of the above answers are "NO", then may proceed with Cephalosporin use.    Follow-up Information    Orpah Cobb, MD. Schedule an appointment as soon as possible for a visit in 1 week(s).   Specialty:  Cardiology Contact information: [address] [place] [address] [phone]        Charles Savers, MD. Schedule an appointment as soon as possible for a visit in 2 week(s).   Specialty:  Internal Medicine Why:  f/u in 2-3 weeks. Contact information: [address] [place] [address] [phone]            The results of significant diagnostics from this hospitalization (including imaging, microbiology, ancillary and laboratory) are listed below for reference.    Significant Diagnostic Studies: Dg Chest 2 View  Result Date: 05/29/2017 CLINICAL DATA:  Left  chest pain. EXAM: CHEST  2 VIEW COMPARISON:  03/06/2017. FINDINGS: Stable enlarged cardiac silhouette, post CABG changes and left atrial clip. The aorta remains mildly tortuous and calcified. Stable mild prominence of the pulmonary vasculature and interstitial markings. No pleural fluid. Thoracic spine degenerative changes. IMPRESSION: No acute abnormality. Stable cardiomegaly, mild pulmonary vascular congestion and mild chronic interstitial lung disease. Electronically Signed   By: Beckie Salts M.D.   On: 05/29/2017 16:31   Nm Myocar Multi W/spect W/wall Motion / Ef  Result Date: 05/31/2017 CLINICAL DATA:  Chest pain and shortness of breath. Congestive heart failure. Coronary artery disease and previous CABG. EXAM: MYOCARDIAL IMAGING WITH SPECT (REST AND PHARMACOLOGIC-STRESS - 2 DAY PROTOCOL) GATED LEFT VENTRICULAR WALL MOTION STUDY LEFT VENTRICULAR EJECTION  FRACTION TECHNIQUE: Standard myocardial SPECT imaging was performed after resting intravenous injection of 30 mCi Tc-71m tetrofosmin. Subsequently, on a second day, intravenous infusion of Lexiscan was performed under the supervision of the Cardiology staff. At peak effect of the drug, 30 mCi Tc-17m tetrofosmin was injected intravenously and standard myocardial SPECT imaging was performed. Quantitative gated imaging was also performed to evaluate left ventricular wall motion, and estimate left ventricular ejection fraction. COMPARISON:  None. FINDINGS: Perfusion: A large moderate fixed area of decreased myocardial activity is seen in the anterior, apical, and septal walls on both the stress and rest images, consistent with infarction. No reversible myocardial perfusion defects are seen to suggest the presence of inducible ischemia. Wall Motion: Moderate left ventricular dilatation with diffuse hypokinesis. Left Ventricular Ejection Fraction: 27 % End diastolic volume 179 ml End systolic volume 132 ml IMPRESSION: 1. No evidence of reversible myocardial ischemia. Large infarct involving the anterior, apical, and septal walls. 2. Diffuse left ventricular hypokinesis with moderate left ventricular dilatation. 3. Left ventricular ejection fraction 27% 4. Non invasive risk stratification*: High *2012 Appropriate Use Criteria for Coronary Revascularization Focused Update: J Am Coll Cardiol. 2012;59(9):857-881. http://content.dementiazones.com.aspx?articleid=[email or web address] Electronically Signed   By: Myles Rosenthal M.D.   On: 05/31/2017 11:13    Microbiology: No results found for this or any previous visit (from the past 240 hour(s)).   Labs: Basic Metabolic Panel:  Recent Labs Lab 05/29/17 1541 05/31/17 1006  NA 142 138  K 3.5 3.3*  CL 106 102  CO2 26 24  GLUCOSE 156* 113*  BUN 13 16  CREATININE 1.28* 1.11  CALCIUM 9.0 8.8*  MG  --  1.9   Liver Function Tests:  Recent Labs Lab 05/29/17 1541  AST 27   ALT 17  ALKPHOS 75  BILITOT 0.6  PROT 6.6  ALBUMIN 3.6    Recent Labs Lab 05/29/17 1541  LIPASE 25   No results for input(s): AMMONIA in the last 168 hours. CBC:  Recent Labs Lab 05/29/17 1541 05/30/17 0510 05/31/17 0415  WBC 9.3 9.1 8.4  HGB 14.0 13.8 13.2  HCT 43.2 42.5 41.3  MCV 91.5 91.4 92.0  PLT 242 202 199   Cardiac Enzymes:  Recent Labs Lab 05/29/17 2253 05/30/17 0510 05/30/17 1531  TROPONINI <0.03 <0.03 <0.03   BNP: BNP (last 3 results)  Recent Labs  12/13/16 1612 04/25/17 2227 05/30/17 0510  BNP 417.5* 146.2* 205.0*    ProBNP (last 3 results) No results for input(s): PROBNP in the last 8760 hours.  CBG: No results for input(s): GLUCAP in the last 168 hours.     SignedRamiro Harvest MD.  [facility] 05/31/2017, 6:16 PM

## 2017-05-31 NOTE — Progress Notes (Signed)
ANTICOAGULATION CONSULT NOTE  Pharmacy Consult for heparin (PTA Eliquis on hold) Indication: chest pain/ACS/afib   Allergies  Allergen Reactions  . Niacin And Related Other (See Comments)    FLUSHING  . Xarelto [Rivaroxaban] Other (See Comments)    Cause bloody stools  . Penicillins Itching, Rash and Other (See Comments)    Has patient had a PCN reaction causing immediate rash, facial/tongue/throat swelling, SOB or lightheadedness with hypotension: Yes Has patient had a PCN reaction causing severe rash involving mucus membranes or skin necrosis: No Has patient had a PCN reaction that required hospitalization: No Has patient had a PCN reaction occurring within the last 10 years: No If all of the above answers are "NO", then may proceed with Cephalosporin use.     Patient Measurements: Height: 5\' 10"  (177.8 cm) Weight: (!) 322 lb 6.4 oz (146.2 kg) IBW/kg (Calculated) : 73 Heparin Dosing Weight: 107 kg   Vital Signs: Temp: 98.2 F (36.8 C) (06/27 0520) Temp Source: Oral (06/27 0520) BP: 112/68 (06/27 0520) Pulse Rate: 81 (06/27 0520)  Labs:  Recent Labs  05/29/17 1541  05/29/17 2253 05/30/17 0510 05/30/17 1531 05/30/17 2248 05/31/17 0415  HGB 14.0  --   --  13.8  --   --  13.2  HCT 43.2  --   --  42.5  --   --  41.3  PLT 242  --   --  202  --   --  199  APTT  --   < > 30  --  65* 78* 78*  HEPARINUNFRC  --   --  1.07*  --  0.96*  --  0.70  CREATININE 1.28*  --   --   --   --   --   --   TROPONINI  --   --  <0.03 <0.03 <0.03  --   --   < > = values in this interval not displayed.  Estimated Creatinine Clearance: 82.1 mL/min (A) (by C-G formula based on SCr of 1.28 mg/dL (H)).   Assessment: 66 yo male continuing on heparin for ACS, history of afib on Eliquis (last dose at 0900 on 6/25). CBC stable.  aPTT remains therapeutic, heparin level not yet correlating. No bleeding documented.  Goal of Therapy:  Heparin level 0.3-0.7 units/ml aPTT 66-102 seconds Monitor  platelets by anticoagulation protocol: Yes   Plan:  Continue heparin at 1650 units/hr Daily heparin level and aPTT until correlating, daily CBC Monitor for s/s bleeding PTA Eliquis on hold   Babs Bertin, PharmD, BCPS Clinical Pharmacist Rx Phone # for today: [phone] After 3:30PM, please call Main Rx: [phone] 05/31/2017 8:51 AM

## 2017-05-31 NOTE — Progress Notes (Signed)
Patient in a stable condition, discharged education reviewed with patient he verbalized understanding, iv removed, tele dc ccmd notified, patient belongings at bedside.

## 2017-05-31 NOTE — Progress Notes (Signed)
  Echocardiogram 2D Echocardiogram has been performed.  Ashlie Mcmenamy T Dal Blew 05/31/2017, 12:49 PM

## 2017-05-31 NOTE — Consult Note (Signed)
Ref: Charles Savers, MD   Subjective:  No chest pain. Passed nuclear stress test. Patient aware of poor LV systolic function and need for limiting salt and fluid intake along with calorie limitation.  Objective:  Vital Signs in the last 24 hours: Temp:  [97.5 F (36.4 C)-98.2 F (36.8 C)] 97.9 F (36.6 C) (06/27 1428) Pulse Rate:  [74-84] 75 (06/27 1428) Cardiac Rhythm: Atrial fibrillation (06/27 0700) Resp:  [18] 18 (06/27 1428) BP: (96-119)/(54-69) 96/54 (06/27 1428) SpO2:  [95 %-98 %] 98 % (06/27 1428)  Physical Exam: BP Readings from Last 1 Encounters:  05/31/17 (!) 96/54    Wt Readings from Last 1 Encounters:  05/30/17 (!) 146.2 kg (322 lb 6.4 oz)    Weight change:  Body mass index is 46.26 kg/m. HEENT: Kimberly/AT, Eyes-Hazel, PERL, EOMI, Conjunctiva-Pink, Sclera-Non-icteric Neck: No JVD, No bruit, Trachea midline. Lungs:  Clear, Bilateral. Cardiac:  Regular rhythm, normal S1 and S2, no S3. II/VI systolic murmur. Abdomen:  Soft, non-tender. BS present. Extremities:  Trace edema present. No cyanosis. No clubbing. CNS: AxOx3, Cranial nerves grossly intact, moves all 4 extremities.  Skin: Warm and dry.   Intake/Output from previous day: 06/26 0701 - 06/27 0700 In: 553 [P.O.:355; I.V.:198] Out: 1300 [Urine:1300]    Lab Results: BMET    Component Value Date/Time   NA 138 05/31/2017 1006   NA 142 05/29/2017 1541   NA 143 05/08/2017 1524   K 3.3 (L) 05/31/2017 1006   K 3.5 05/29/2017 1541   K 3.8 05/08/2017 1524   CL 102 05/31/2017 1006   CL 106 05/29/2017 1541   CL 106 05/08/2017 1524   CO2 24 05/31/2017 1006   CO2 26 05/29/2017 1541   CO2 27 05/08/2017 1524   GLUCOSE 113 (H) 05/31/2017 1006   GLUCOSE 156 (H) 05/29/2017 1541   GLUCOSE 99 05/08/2017 1524   GLUCOSE 99 09/12/2006 1309   BUN 16 05/31/2017 1006   BUN 13 05/29/2017 1541   BUN 15 05/08/2017 1524   CREATININE 1.11 05/31/2017 1006   CREATININE 1.28 (H) 05/29/2017 1541   CREATININE 1.14  05/08/2017 1524   CALCIUM 8.8 (L) 05/31/2017 1006   CALCIUM 9.0 05/29/2017 1541   CALCIUM 9.2 05/08/2017 1524   GFRNONAA >60 05/31/2017 1006   GFRNONAA 57 (L) 05/29/2017 1541   GFRNONAA >60 04/25/2017 1607   GFRAA >60 05/31/2017 1006   GFRAA >60 05/29/2017 1541   GFRAA >60 04/25/2017 1607   CBC    Component Value Date/Time   WBC 8.4 05/31/2017 0415   RBC 4.49 05/31/2017 0415   HGB 13.2 05/31/2017 0415   HCT 41.3 05/31/2017 0415   PLT 199 05/31/2017 0415   MCV 92.0 05/31/2017 0415   MCH 29.4 05/31/2017 0415   MCHC 32.0 05/31/2017 0415   RDW 14.7 05/31/2017 0415   LYMPHSABS 1.9 04/25/2017 2227   MONOABS 0.7 04/25/2017 2227   EOSABS 0.2 04/25/2017 2227   BASOSABS 0.1 04/25/2017 2227   HEPATIC Function Panel  Recent Labs  04/25/17 2227 05/29/17 1541  PROT 6.5 6.6   HEMOGLOBIN A1C No components found for: HGA1C,  MPG CARDIAC ENZYMES Lab Results  Component Value Date   CKTOTAL 134 04/19/2015   CKMB 0.9 04/19/2015   TROPONINI <0.03 05/30/2017   TROPONINI <0.03 05/30/2017   TROPONINI <0.03 05/29/2017   BNP No results for input(s): PROBNP in the last 8760 hours. TSH No results for input(s): TSH in the last 8760 hours. CHOLESTEROL  Recent Labs  04/26/17 1209 05/30/17  0510  CHOL 197 191    Scheduled Meds: . allopurinol  100 mg Oral Daily  . amiodarone  200 mg Oral Daily  . aspirin EC  81 mg Oral Daily  . atorvastatin  40 mg Oral q1800  . carvedilol  6.25 mg Oral BID WC  . cholecalciferol  1,000 Units Oral Daily  . fluticasone  1 spray Each Nare Daily  . furosemide  80 mg Oral BID  . losartan  25 mg Oral Daily   Continuous Infusions: . heparin 1,650 Units/hr (05/31/17 0522)   PRN Meds:.acetaminophen, albuterol, morphine injection, nitroGLYCERIN, ondansetron (ZOFRAN) IV, zolpidem  Assessment/Plan: Chest pain CAD CABG Hypertension Paroxysmal atrial fibrillation Morbid obesity Chronic systolic left heart failure Gout Back  pain Arthritis  Continue amiodarone, Carvedilol, Losartan, Furosemide, Potassium and Apixaban.   LOS: 0 days    Orpah Cobb  MD  05/31/2017, 3:14 PM

## 2017-06-01 ENCOUNTER — Ambulatory Visit: Payer: Medicare Other | Admitting: *Deleted

## 2017-06-01 ENCOUNTER — Telehealth: Payer: Self-pay | Admitting: *Deleted

## 2017-06-01 NOTE — Telephone Encounter (Signed)
D/C 05/31/17  DX: Chest pain   Transition Care Management Follow-up Telephone Call  How have you been since you were released from the hospital? "I have been doing really good. I feel well since I've been home"   Do you understand why you were in the hospital? "Yes, I went because I was having chest pain. They didn't find anything that made them think I was having a heart attack"     Do you understand the discharge instrcutions? "yes, I know I need to take it easy, not be out in the heat, wait to go outside in the evenings, follow healthy diet, get a little exercise, and take my medications as I'm supposed to"    Items Reviewed:  Medications reviewed: yes, no changes were made during hospital observation stay  Allergies reviewed: Yes; no changes   Dietary changes reviewed: no changes were made, educated patient on continuing to follow low sodium/heart health diet.   Referrals reviewed: No   Functional Questionnaire:   Activities of Daily Living (ADLs):   He states they are independent in the following: patient reports he is independent at home for self care States they require assistance with the following: none   Any transportation issues/concerns?: No, patient reports that he is able to drive self   Any patient concerns? No  Confirmed importance and date/time of follow-up visits scheduled: yes, patient to follow up with Dr. Amador Cunas on 06/12/17 at 115pm  Confirmed with patient if condition begins to worsen call PCP or go to the ER.  Patient was given the Call-a-Nurse line [phone]: yes, educated patient on s/s of MI and to seek urgent medical attention if MI symptoms occur; patient voiced understanding

## 2017-06-02 ENCOUNTER — Encounter: Payer: Self-pay | Admitting: *Deleted

## 2017-06-02 ENCOUNTER — Other Ambulatory Visit: Payer: Self-pay | Admitting: *Deleted

## 2017-06-02 NOTE — Patient Outreach (Signed)
[facility] Customer service manager [facility]) Care Management [facility] CM Telephone Outreach  06/02/2017  GREENE VIALE 09/29/1951 [id number]  Successful telephone outreach to [name], 66 y.o.malepreviously active with [facility] CM; patient was re-referred to [facility] CM for transition of care after recent hospitalization May 22-24, 2018 for TIA/ gait imbalance, and was discharged home with home health services through [facility], which is now completed.Patient has history including, but not limited to, CHF, CAD with CABG x 4 in 2016, paroxysmal Atrial fibrillation, HTN, HLD, and morbid obesity. HIPAA/ identity verified with patient during phone call today and patient isin no obvious distress throughout entirety of today's phone call.  Noted that Charles Mckinney had ED visit/ hospital admission for observation June 25-27, 2018 for chest pain; patient was ruled out for acute cardiac event and was discharged home under self-care without home health services.  Today, Charles Mckinney reports that he "is pretty good," and he denies pain, concerns, or problems today.  Accurately verbalizes discharge instructions to increase activity gradually and avoid excessive hot weather, states "they told me to take it easy, which I plan to do."   -- Has all medications and is taking as prescribed. Charles Mckinney again verbalizes a good general understanding of his current medication regimen, including purpose, dosing, and scheduling of medications. Patient denies questions, concerns around medications today.  Reports continuing Eliquis, to follow up with Dr. [name] to obtain samples once he is out of current supply.  -- Reviewed upcoming provider appointments with patient; patient reports that he will drive himself to PCP appointment scheduled 06/12/17.  -- Safety/ Falls/ mobility: Patient denies new falls and continues using cane "as needed," mainly when he "goes out."  General fall risks/ prevention reiterated with  patient today.  -- self-health management of HTN: Patient is able to discuss general strategies for stroke prevention and denies epsiodes chest pain or other concerning symptoms since hospital discharge 05/31/17.  Confirmed with patient upcoming [facility] CCM in-home visit next week, where I will provide him with automatic BP cuff, provide instruction in use.  -- advanced Directive (AD) planning:  Patient confirmed that he completed AD's for HCPOA and Living Will while he was at the hospital earlier this week; positive reinforcement provided for patient completing this.  Patient denies further issues, concerns, or problems today. I confirmed that patient hasmy direct phone number, the main [facility] CM office phone number, and the [facility] CM 24-hour nurse advice phone number should issues arise prior to next scheduled [facility] Community CM outreach with scheduled home visit next week to provide BP cuff.   Plan:  Michaelwill take medications as prescribed and will schedule and attend provider appointments as recommended post-hospital discharge.  Michaelwill promptly notify his providers for any new concerns, problems, or issues that arise.  [facility] Community CM outreach to continue with home visit next week.   [name], RN, BSN, [employer] [facility] Care Management  [phone]

## 2017-06-06 ENCOUNTER — Encounter: Payer: Self-pay | Admitting: *Deleted

## 2017-06-06 ENCOUNTER — Other Ambulatory Visit: Payer: Self-pay | Admitting: *Deleted

## 2017-06-06 NOTE — Patient Outreach (Signed)
[facility]) Care Management  [facility] Community CM Routine Home Visit 06/06/2017  JAISEN WILTROUT [birth year] [id number]  ELBA DENDINGER is an 66 y.o. male previously active with [facility] CM; patient was re-referred to [facility] Community CM for transition of care after recent hospitalization May 22-24, 2018 for TIA/ gait imbalance, and was discharged home with home health services through [facility], which is now completed.Patient has history including, but not limited to, CHF, CAD with CABG x 4 in 2016, paroxysmal Atrial fibrillation, HTN, HLD, and morbid obesity. HIPAA/ identity verified with patient in person todayat his home, and patient isin no distress throughout entirety of today's home visit.  Yony had recent ED visit/ hospital admission (observation status) June 25-27, 2018 for chest pain; patient was ruled out for acute cardiac event and was discharged home under self-care without home health services.  Today, Demarkis reports that he "is doing better," and he denies pain, concerns, or problems today.  Patient states that he is "taking it easy and staying out of the heat."   -- Has all medications and is taking as prescribed.Raimundo again verbalizes a good general/ overall understanding of his current medication regimen, including purpose, dosing, and scheduling of medications.  Reports that he has been obtaining Eliquis from Dr. [name], "he gives me samples," and that he is only taking this medication "one time daily," although it is prescribed BID.  Nihar states that "the pradaxa I used to take was once a day" which is why he has independently decided to take the Eliquis only once a day.  Jaxston stated that he "did not want to run into trouble with bleeding."  I explained that Eliquis and Pradaxa are different drugs, with different dosing guidelines, but Aqib insists to me that he will only take the Eliquis "once daily" because that is how he used to take  Pradaxa.  I counseled/ advised patient to make Dr. [name] aware of how he is taking this medication, and I will also send Dr. [name] secure message regarding same.  Nimai also states that he spoke with a pharmacist at [facility], who told him that if Dr. [name] would write the prescription, the pharmacist would help him obtain assistance in the cost of the medication because he is a Cytogeneticist.  However, Dawud is not sure he wishes to do this, as he currently gets "free samples" from Dr. [name].  I asked Macyn what he would do if the "free samples" were no longer available, and he said he would "have to stop taking" the Eliquis, stating that he "already knows" he cannot afford this medication.  After some discussion, Tobby finally agreed to my placing a [facility] pharmacy referral to explore options for medication assistance/ review of dosing of his current Rx.  Patient denies further questions, concerns around medications today, and thorough of review of all medications was completed during home visit today.  Patient was recently discharged from hospital (observation status) and all medications have now been reviewed.    -- Reviewed upcoming provider appointments with patient; patient reports that he will drive himself to PCP appointment scheduled 06/12/17.  Encouraged patient to also discuss his independent decision on Eliquis dosing with PCP.  Noted that patient is scheduled for a sleep evaluation with pulmonologist on July 04, 2017.  Patient states he is going to cancel this appointment, as he "already knows" that he cannot tolerate a "mask on" his face, and "cannot afford the equipment anyway."  I provided EMMI  educational material on sleep study evaluation to Cayson and thoroughly reviewed with him today; explained that he could contact his insurance company to determine costs/ coverage of his co-pay for the visit, which he is convinced "will cost a fortune," as well as the study itself.  I  explained the rationale for the sleep study, and we discussed the benefits of having the study to determine if he could benefit from any recommended intervention; unfortunately Cha appears to be already convinced that he will not attend this appointment, and cannot tolerate any potential treatment that might be suggested.  I also encouraged Loras to inform his PCP if he will not attend pulmonology appointment, and he agrees to do this.  Dejoun continues attending regular appointments with his eye doctor where he receives injections "behind" his eye to "remove the blood" after his recent eye issue February 10, 2017.  Reports that he is able to see in daylight to drive, but continues to have difficulty with seeing/ blurriness in dim light or at night.  Reports that he believes that his "vision is slowly improving."  -- Safety/ Falls/ mobility: Patient denies new falls and continues using cane "as needed," mainly when he "goes out." General fall risks/ prevention reiterated with patient today.  -- self-health management of HTN: Patient is able to discuss general strategies for stroke prevention and denies epsiodes chest pain or other concerning symptoms since hospital discharge 05/31/17. I provided him with automatic BP cuff, and using teachback method,  instruction in use.  EMMI educational material on in-home BP monitoring provided to patient and reviewed with him using teachback method.  -- Advanced Directive (AD) planning:  Again provided positive reinforcement provided for patient completing this during most recent hospitalization.  Patient denies questions/ concerns around AD's.  Patient denies further issues, concerns, or problems today. I confirmed that patient hasmy direct phone number, the main [facility] CM office phone number, and the [facility] CM 24-hour nurse advice phone number should issues arise prior to next scheduled [facility] Community CM outreach by phone in 2 weeks.  Subjective: "The last  blood thinner they had me on I only took one time a day, so that is all I am taking this Eliquis.  I don't want to have a problem with bleeding, and some one told me that the Eliquis is the same drug as the pradaxa I used to take."  Objective:    BP 132/76   Pulse 78   Resp 18   SpO2 95%    Review of Systems  Constitutional: Negative.   Eyes: Positive for blurred vision.       Ongoing issue with eyesight from "blood under my eye;"  Patient continues seeing eye doctor regularly for "injections to help remove the blood from under my eye."   Respiratory: Negative for cough, shortness of breath and wheezing.        No SOB today with ambulation around home; patient reports baseline SOB with activity and reports recovers easily with rest  Cardiovascular: Positive for leg swelling. Negative for chest pain.       Baseline +2-3 bilateral LE (ankle/ foot) swelling; no erythema  Gastrointestinal: Negative for abdominal pain and nausea.  Genitourinary: Positive for frequency and urgency.       Diuretic therapy  Musculoskeletal: Negative for falls.  Neurological: Negative.   Psychiatric/Behavioral: Negative for depression. The patient is not nervous/anxious.     Physical Exam  Constitutional: He is oriented to person, place, and time. He appears well-developed  and well-nourished. No distress.  Cardiovascular: Normal rate, regular rhythm, normal heart sounds and intact distal pulses.   Respiratory: Effort normal and breath sounds normal. No respiratory distress. He has no wheezes. He has no rales.  GI: Soft. Bowel sounds are normal.  Musculoskeletal: He exhibits edema.  See ROS  Neurological: He is alert and oriented to person, place, and time.  Skin: Skin is warm and dry. No erythema.  Psychiatric: He has a normal mood and affect. His behavior is normal. Judgment and thought content normal.   Assessment:  Kionte continues recuperating well after his recent hospitalization.  Jerran is very  concerned about his finances as he is no longer working and is actively in process of applying for disability; because of this, Garcia refuses to attend upcoming pulmonology appointment to evaluate his sleep breathing patterns, as he is convinced he can not afford nor tolerate any potential intervention.  Ching is not taking his Eliquis as prescribed and is convinced that if he takes this medication twice a day he will have complications from bleeding; he is currently receiving this medication from his cardiologist through office samples.  Colt is able to verbalize signs/ symptoms of MI and CVA, as well as corresponding action plans for both.  Ernan was provided an automatic blood pressure cuff during home visit today and was thoroughly instructed in use of same.  Plan:  Michaelwill take medications as prescribed and will schedule and attend provider appointments as recommended post-hospital discharge.  Michaelwill promptly notify his providers for any new concerns, problems, or issues that arise.  Shizuo will make his PCP and cardiologist aware that he is not taking Eliquis as prescribed; I will also send secure message to both providers indicating same.  I will make [facility] pharmacy referral for review of Eliquis dosing with patient and to explore options for financial assistance with this medication, and Le will communicate with [facility] Pharmacist about this medication.  Kamerin will inform his PCP that he does not plan on attending upcoming pulmonology appointment for sleep breathing pattern evaluation  Irving will review EMMI educational material provided to him today and will begin monitoring and recording his BP's at home at least twice weekly  [facility] Community CM outreach to continue with phone call in 2 weeks.   [facility] CM Care Plan Problem One     Most Recent Value  Care Plan Problem One  Medication adherence and stated need for financial assistance for Eliquis  Role Documenting  the Problem One  Care Management Coordinator  Care Plan for Problem One  Active  [facility] CM Short Term Goal #1   Over the next 30 days, patient will communicate with [facility] pharmacist and will be able to verbalize accurate dosing of Eliquis and plan for obtaining this medication, as evidenced by patient reporting and collaboration with [facility] pharmacist during [facility] RN CM outreach  [facility] CM Short Term Goal #1 Start Date  06/06/17  Interventions for Short Term Goal #1  Using teachback method, thoroughly discussed current dosing rx for Eliquis with patient,  advised patient to take this medication as it is prescribed, and made providers aware that patient is not taking the medication as it is prescribed,  [facility] pharmacy referral placed    [facility] CM Care Plan Problem Two     Most Recent Value  Care Plan Problem Two  Knowledge deficit related to self-health management for stroke prevention/ HTN  Role Documenting the Problem Two  Care Management Coordinator  Care Plan for  Problem Two  Active  Interventions for Problem Two Long Term Goal   Utilizing teachback method, provided and reviewed educational material on home BP monitoring with patient,  provided home BP cuff and instruction in use.  Reviewed previously provided education for stroke prevention, stroke signs/ symptoms, FAST  [facility] Long Term Goal  Over the next 40 days, patient will be able to verbalize 3 strategies for stroke prevention, as evidenced by patient reporting during [facility] RN CM outreach  [facility] Long Term Goal Start Date  05/17/17     [name], RN, BSN, CCRN [facility] Coordinator [facility] Care Management  [phone]

## 2017-06-12 ENCOUNTER — Ambulatory Visit (INDEPENDENT_AMBULATORY_CARE_PROVIDER_SITE_OTHER): Payer: Medicare Other | Admitting: Internal Medicine

## 2017-06-12 ENCOUNTER — Encounter: Payer: Self-pay | Admitting: Internal Medicine

## 2017-06-12 VITALS — BP 120/74 | HR 89 | Temp 97.8°F | Ht 70.0 in | Wt 327.8 lb

## 2017-06-12 DIAGNOSIS — I10 Essential (primary) hypertension: Secondary | ICD-10-CM

## 2017-06-12 DIAGNOSIS — N183 Chronic kidney disease, stage 3 unspecified: Secondary | ICD-10-CM

## 2017-06-12 DIAGNOSIS — I5023 Acute on chronic systolic (congestive) heart failure: Secondary | ICD-10-CM

## 2017-06-12 LAB — BASIC METABOLIC PANEL
BUN: 14 mg/dL (ref 6–23)
CALCIUM: 9.1 mg/dL (ref 8.4–10.5)
CO2: 26 mEq/L (ref 19–32)
Chloride: 104 mEq/L (ref 96–112)
Creatinine, Ser: 1.08 mg/dL (ref 0.40–1.50)
GFR: 72.52 mL/min (ref >60.00)
GLUCOSE: 127 mg/dL — AB (ref 70–99)
Potassium: 3.6 mEq/L (ref 3.5–5.1)
SODIUM: 141 meq/L (ref 135–145)

## 2017-06-12 NOTE — Patient Instructions (Addendum)
WE NOW OFFER   [facility] FAST TRACK!!!  SAME DAY Appointments for ACUTE CARE  Such as: Sprains, Injuries, cuts, abrasions, rashes, muscle pain, joint pain, back pain Colds, flu, sore throats, headache, allergies, cough, fever  Ear pain, sinus and eye infections Abdominal pain, nausea, vomiting, diarrhea, upset stomach Animal/insect bites  3 Easy Ways to Schedule: Walk-In Scheduling Call in scheduling Mychart Sign-up: [email or web address]   HEART FAILURE PATIENTS) Call MD:  Anytime you have any of the following symptoms: 1) 3 pound weight gain in 24 hours or 5 pounds in 1 week 2) shortness of breath, with or without a dry hacking cough 3) swelling in the hands, feet or stomach 4) if you have to sleep on extra pillows at night in order to breathe.    Complete by:  As directed    Diet - low sodium heart healthy      Return in 3 months for follow-up  Limit your sodium (Salt) intake   Cardiology follow-up as scheduled

## 2017-06-12 NOTE — Progress Notes (Signed)
Subjective:    Patient ID: Charles Mckinney, male    DOB: [birth year], 66 y.o.   MRN: [id number]  HPI  Admit date: 05/29/2017 Discharge date: 05/31/2017  Recommendations for Outpatient Follow-up:  1. Follow-up with [name], MD in 2-3 weeks. On follow-up patient in need a basic metabolic profile done to follow-up on electrolytes and renal function. 2. Follow-up with [name] in 1 week.   Discharge Diagnoses:  Principal Problem:   Chest pain Active Problems:   Hyperlipidemia   Morbid obesity (HCC)   Paroxysmal atrial fibrillation (HCC)   Chronic systolic heart failure (HCC)   S/P CABG x 4   CKD (chronic kidney disease), stage III   Essential hypertension   Gout  Hospital Course:  #1 chest pain Patient had presented with chest pain and seen in consultation by cardiology. Cardiac enzymes which was cycled were negative 3. Patient was seen in consultation by Dr. [name] followed the patient throughout the hospitalization. 2-D echo which was done showed mild to moderately dilated LV and mild LV hypertrophy. EF of 30-35% with diffuse hypokinesis. Myoview stress test which was done showed no evidence of reversible myocardial ischemia. Left ventricle hypokinesis and moderate left ventricular dilatation. EF of 27%. Per cardiology patient was a well poor LV systolic function in need of diet restrictions and fluid intake. Patient had no further chest pain. Was recommended per cardiology to continue medical management.   66 year old patient who is seen today following a recent hospital discharge 2 weeks ago an for transitional care management.  Hospital records reviewed.   He was admitted for evaluation of chest pain.  Evaluation included a Myoview stress test which revealed no evidence of reversible ischemia.  He was hospitalized in January was decompensated systolic heart failure.  Furosemide was increased to 80 mg twice daily at that time.  Renal indices were stable during  his recent hospital admission, but he did have some mild hypokalemia.  He is on potassium supplementation twice daily  He was also admitted to the hospital in May for a suspected TIA.  He is on chronic anticoagulation  He is scheduled to see pulmonary next month for sleep evaluation.  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  He states that he has never been able to tolerate CPAP.  He apparently has been placed on BiPAP support during hospitalizations for decompensated heart failure.  In general doing quite well today  Wt Readings from Last 3 Encounters:  06/12/17 (!) 327 lb 12.8 oz (148.7 kg)  05/30/17 (!) 322 lb 6.4 oz (146.2 kg)  05/17/17 (!) 320 lb (145.2 kg)   Denies any recurrent chest pain.  No symptoms of heart failure, although he does sleep in a recliner.  There has been some weight gain since his hospital discharge, but no worsening chronic pedal edema  Past Medical History:  Diagnosis Date  . Anxiety   . CHF (congestive heart failure) (HCC)   . Dysrhythmia    ATRIAL FIBRILATION, resolved with cardioversion  . GERD (gastroesophageal reflux disease)   . Hyperlipidemia   . Hypertension   . Hypertensive heart disease   . Kidney stones   . Obesity (BMI 30-39.9)   . Osteoarthritis   . PONV (postoperative nausea and vomiting)   . Shortness of breath dyspnea      Social History   Social History  . Marital status: Widowed    Spouse name: N/A  . Number of children: N/A  .  Years of education: N/A   Occupational History  . disabled    Social History Main Topics  . Smoking status: Former Smoker    Quit date: 2000  . Smokeless tobacco: Never Used  . Alcohol use No  . Drug use: No  . Sexual activity: Yes   Other Topics Concern  . Not on file   Social History Narrative  . No narrative on file    Past Surgical History:  Procedure Laterality Date  . CARDIAC CATHETERIZATION N/A 04/09/2015   Procedure: Right/Left Heart Cath  and Coronary Angiography;  Surgeon: Orpah Cobb, MD;  Location: [facility] INVASIVE CV LAB CUPID;  Service: Cardiovascular;  Laterality: N/A;  . CARDIOVERSION N/A 01/19/2015   Procedure: CARDIOVERSION;  Surgeon: Othella Boyer, MD;  Location: [facility] ENDOSCOPY;  Service: Cardiovascular;  Laterality: N/A;  pt in Afib, 12:22 synched cardioversion @  120 joules using Propofol 100 mg,IV....unsuccessful, repeated at 200 joules  . CARDIOVERSION N/A 04/02/2015   Procedure: CARDIOVERSION;  Surgeon: Orpah Cobb, MD;  Location: [facility] ENDOSCOPY;  Service: Cardiovascular;  Laterality: N/A;  . CATARACT EXTRACTION    . CLIPPING OF ATRIAL APPENDAGE  04/13/2015   Procedure: CLIPPING OF ATRIAL APPENDAGE;  Surgeon: Alleen Borne, MD;  Location: [facility] OR;  Service: Open Heart Surgery;;  . CORONARY ARTERY BYPASS GRAFT N/A 04/13/2015   Procedure: CORONARY ARTERY BYPASS GRAFTING (CABG);  Surgeon: Alleen Borne, MD;  Location: [facility] OR;  Service: Open Heart Surgery;  Laterality: N/A;  . TEE WITHOUT CARDIOVERSION N/A 04/13/2015   Procedure: TRANSESOPHAGEAL ECHOCARDIOGRAM (TEE);  Surgeon: Alleen Borne, MD;  Location[facility] OR;  Service: Open Heart Surgery;  Laterality: N/A;    Family History  Problem Relation Age of Onset  . Diabetes Mother   . Heart disease Father   . Congestive Heart Failure Sister   . Stroke Brother   . Heart disease Brother   . Diabetes Brother     Allergies  Allergen Reactions  . Niacin And Related Other (See Comments)    FLUSHING  . Xarelto [Rivaroxaban] Other (See Comments)    Cause bloody stools  . Penicillins Itching, Rash and Other (See Comments)    Has patient had a PCN reaction causing immediate rash, facial/tongue/throat swelling, SOB or lightheadedness with hypotension: Yes Has patient had a PCN reaction causing severe rash involving mucus membranes or skin necrosis: No Has patient had a PCN reaction that required hospitalization: No Has patient had a PCN reaction occurring within the last 10 years:  No If all of the above answers are "NO", then may proceed with Cephalosporin use.     Current Outpatient Prescriptions on File Prior to Visit  Medication Sig Dispense Refill  . acetaminophen (TYLENOL) 500 MG tablet Take 1,000 mg by mouth every 6 (six) hours as needed for moderate pain. Reported on 03/11/2016    . allopurinol (ZYLOPRIM) 100 MG tablet TAKE 1 TABLET(100 MG) BY MOUTH DAILY 90 tablet 0  . amiodarone (PACERONE) 200 MG tablet TAKE 1 TABLET(200 MG) BY MOUTH DAILY 90 tablet 0  . apixaban (ELIQUIS) 2.5 MG TABS tablet Take 2.5 mg by mouth 2 (two) times daily.     Marland Kitchen atorvastatin (LIPITOR) 40 MG tablet Take 1 tablet (40 mg total) by mouth daily at 6 PM. (Patient taking differently: Take 40 mg by mouth daily. ) 30 tablet 0  . carvedilol (COREG) 6.25 MG tablet TAKE 1 TABLET(6.25 MG) BY MOUTH TWICE DAILY WITH A MEAL 180 tablet 0  . Cholecalciferol (VITAMIN D  PO) Take 1 tablet by mouth daily.    . fluticasone (FLONASE) 50 MCG/ACT nasal spray Place 1 spray into both nostrils daily.    . furosemide (LASIX) 80 MG tablet Take 80 mg by mouth 2 (two) times daily.    Marland Kitchen losartan (COZAAR) 25 MG tablet Take 1 tablet (25 mg total) by mouth daily. 30 tablet 0  . nitroGLYCERIN (NITROSTAT) 0.4 MG SL tablet Place 0.4 mg under the tongue every 5 (five) minutes as needed for chest pain.    . potassium chloride SA (K-DUR,KLOR-CON) 20 MEQ tablet Take 1 tablet (20 mEq total) by mouth 2 (two) times daily.    Marland Kitchen PROAIR HFA 108 (90 Base) MCG/ACT inhaler INHALE 1 PUFF INTO THE LUNGS EVERY 6 HOURS AS NEEDED FOR WHEEZING OR SHORTNESS OF BREATH 8.5 g 0   No current facility-administered medications on file prior to visit.     BP 120/74 (BP Location: Left Arm, Patient Position: Sitting, Cuff Size: Normal)   Pulse 89   Temp 97.8 F (36.6 C) (Oral)   Ht 5\' 10"  (1.778 m)   Wt (!) 327 lb 12.8 oz (148.7 kg)   SpO2 97%   BMI 47.03 kg/m     Review of Systems  Constitutional: Positive for fatigue. Negative for  appetite change, chills and fever.  HENT: Negative for congestion, dental problem, ear pain, hearing loss, sore throat, tinnitus, trouble swallowing and voice change.   Eyes: Negative for pain, discharge and visual disturbance.  Respiratory: Negative for cough, chest tightness, wheezing and stridor.   Cardiovascular: Positive for leg swelling. Negative for chest pain and palpitations.  Gastrointestinal: Negative for abdominal distention, abdominal pain, blood in stool, constipation, diarrhea, nausea and vomiting.  Genitourinary: Negative for difficulty urinating, discharge, flank pain, genital sores, hematuria and urgency.  Musculoskeletal: Positive for gait problem. Negative for arthralgias, back pain, joint swelling, myalgias and neck stiffness.  Skin: Negative for rash.  Neurological: Negative for dizziness, syncope, speech difficulty, weakness, numbness and headaches.  Hematological: Negative for adenopathy. Does not bruise/bleed easily.  Psychiatric/Behavioral: Negative for behavioral problems and dysphoric mood. The patient is not nervous/anxious.        Objective:   Physical Exam  Constitutional: He is oriented to person, place, and time. He appears well-developed.  Weight 327 Blood pressure 120/70  HENT:  Head: Normocephalic.  Right Ear: External ear normal.  Left Ear: External ear normal.  Eyes: Conjunctivae and EOM are normal.  Neck: Normal range of motion.  Cardiovascular: Normal rate and normal heart sounds.   Pulmonary/Chest: Breath sounds normal.  Diminished breath sounds but clear  O2 saturation 97%  Abdominal: Bowel sounds are normal.  Musculoskeletal: Normal range of motion. He exhibits edema. He exhibits no tenderness.  Chronic plus 2 ankle edema  Neurological: He is alert and oriented to person, place, and time.  Psychiatric: He has a normal mood and affect. His behavior is normal.          Assessment & Plan:   Status post recent hospital admission for  chest pain.  Negative Myoview for reversible ischemia Morbid obesity.  Patient is scheduled to see pulmonary for evaluation of possible OSA.  Patient is reluctant since he feels he will not be tolerated a CPAP mask Chronic systolic heart failure.  Seems compensated, although there has been some weight gain since his hospital discharge.  Low-salt diet stressed.  Continue furosemide 80 mg twice daily.  We'll check electrolytes History of hypokalemia.  Continue potassium supplementation.  Check electrolytes  Cardiology follow-up as scheduled Pulmonary follow-up as scheduled Return here in 3 months or as needed  NIKE

## 2017-06-19 ENCOUNTER — Other Ambulatory Visit: Payer: Self-pay | Admitting: *Deleted

## 2017-06-19 ENCOUNTER — Encounter: Payer: Self-pay | Admitting: *Deleted

## 2017-06-19 NOTE — Patient Outreach (Signed)
[facility]) Care Management [facility] Community CM Telephone Outreach  06/19/2017  Charles Mckinney September 24, 1951 [id number]  Successful telephone outreach to Charles Mckinney, 66 y.o. male referred to [facility] Community CM for transition of care after recent hospitalization May 22-24, 2018 for TIA/ gait imbalance, and was discharged home with home health services through [facility], which is now completed.Patient has history including, but not limited to, CHF, CAD with CABG x 4 in 2016, paroxysmal Atrial fibrillation, HTN, HLD, and morbid obesity. HIPAA/ identity verified with patient during phone call today and patient isin no distress throughout entirety of today's phone call.  Today, Charles Mckinney reports that he "is doing okay," and states that he woke up "not feeling too good," but "now feels better" since he took his morning medication; he currently denies pain, concerns,or problems today.   -- Has all medications and is taking as prescribed.Charles Mckinney again verbalizes a good general/ overall understanding of his current medication regimen, including purpose, dosing, and scheduling of medications. Again reports that he is now obtaining Eliquis from the pharmacy [facility]), and that he is still only taking this medication "one time daily," although it is prescribed BID.  Patient was again counseled to take medication as prescribed, but Charles Mckinney flatly refuses to take this medication BID.  Charles Mckinney now states that he no longer needs [facility] CM Pharmacy referral, since he is now obtaining this medication from his pharmacy. I counseled/ advised patient to make Dr. [name] aware of how he is taking this medication.  Patient denies further questions, concerns around medications today.   -- Reviewed upcoming provider appointments with patient; patient reports that he attended PCP appointment 06/12/17.  PCP office visit notes reviewed/ discussed with patient; patient has not yet made  appointment with cardiology provider, and was encouraged to do so as soon as possible, which agrees with, stating he will make appointment today.  Again noted that patient is scheduled for a sleep evaluation with pulmonologist on July 04, 2017; patient again flatly states he is not going to attend this appointment; reports he discussed this with his PCP during office visit on 06/12/17.  Charles Mckinney continues attending regular appointments with his eye doctor where he receives injections "behind" his eye to "remove the blood" after his recent eye issue February 10, 2017.  Reports that he is able to see in daylight to drive, but continues to have difficulty with seeing/ blurriness in dim light or at night.    -- Safety/ Falls/ mobility: Patient denies newfalls and continues using cane "as needed," mainly when he "goes out."General fall risks/ prevention reiterated with patient today.  -- self-health management of HTN: Patient is able to discuss general strategies for stroke prevention and denies recent epsiodes unresolved chest pain or other concerning symptoms since hospital discharge 05/31/17.  States that he has had a "few episodes" of chest tightness that he reports were relieved by rest/ and/or his "regular medications," and denies recent use of NTG.  Charles Mckinney reports that he has been checking his blood pressures with automatic BP cuff provided to him during last [facility] CM home visit.  Reports BP this morning of 128/82.  Reports weight today of 324 lbs; continues to state that he is unable to weigh himself daily due to his ongoing vision issues.  Patient denies further issues, concerns, or problems today. I confirmed that patient hasmy direct phone number, the main [facility] CM office phone number, and the [facility] CM 24-hour nurse advice phone number should issues  arise prior to next scheduled [facility] Community CM outreach by phone in 2 weeks.  Plan:  Charles Mckinney take medications as prescribed and will schedule and  attend provider appointments as recommended post-hospital discharge.  Charles Mckinney promptly notify his providers for any new concerns, problems, or issues that arise.  Charles Mckinney will make his cardiologist aware that he is not taking Eliquis as prescribed  I will make Charles Mckinney pharmacy aware that patient's concern around obtaining Eliquis is now resolved, per patient report, and that [facility] referral is no longer indicated.  Charles Mckinney will inform his cardiologist that he does not plan on attending upcoming pulmonology appointment for sleep breathing pattern evaluation  Charles Mckinney will continue monitoring and recording his BP's at home at least twice weekly  [facility] Community CM outreach to continue with scheduled phone call in 2 weeks.   Charles Pina, RN, BSN, [employer] [facility] Care Management  [phone]

## 2017-06-23 DIAGNOSIS — I480 Paroxysmal atrial fibrillation: Secondary | ICD-10-CM

## 2017-06-23 DIAGNOSIS — I110 Hypertensive heart disease with heart failure: Secondary | ICD-10-CM

## 2017-06-27 ENCOUNTER — Other Ambulatory Visit: Payer: Self-pay | Admitting: Internal Medicine

## 2017-07-04 ENCOUNTER — Institutional Professional Consult (permissible substitution): Payer: Medicare Other | Admitting: Internal Medicine

## 2017-07-06 ENCOUNTER — Encounter: Payer: Self-pay | Admitting: *Deleted

## 2017-07-06 ENCOUNTER — Other Ambulatory Visit: Payer: Self-pay | Admitting: *Deleted

## 2017-07-06 ENCOUNTER — Other Ambulatory Visit: Payer: Self-pay | Admitting: Pharmacist

## 2017-07-06 NOTE — Patient Outreach (Signed)
[facility]) Care Management [place] Telephone Outreach  07/06/2017  ZAYLEN SUSMAN [birth year] [id number]  Successful telephone outreach to [name], 66 y.o.malereferred to [place] for transition of care after recent hospitalization May 22-24, 2018 for TIA/ gait imbalance, and was discharged home with home health services through [place], which is now completed.Patient has history including, but not limited to, CHF, CAD with CABG x 4 in 2016, paroxysmal Atrial fibrillation, HTN, HLD, and morbid obesity. HIPAA/ identity verified with patient during phone call today and patient isin no distress throughout entirety of today's phone call.  Today, Jock reports that he "is doing pretty good" and currently denies pain, concerns,or problems today.   -- Has all medications and is taking as prescribed.Tung again verbalizes a good general/ overall understanding of his current medication regimen, including purpose, dosing, and scheduling of medications. Again reports that he is still only taking Eliquis medication "one time daily," although it is prescribed BID. Patient was again counseled to take medication as prescribed and to discuss with his PCP/ cardiologist, but [name] again flatly refuses to take this medication BID, and reports that he did not discuss with providers, stating that his "[place] doctor" knows that he is only taking once daily.  I counseled/ advised patient several times during phone call today to make PCP/ cardiology providers aware of how he is taking this medication.  Patient denies further questions, concerns around medications today.  -- Reviewed upcoming provider appointmentswith patient; attended PCP appointment 06/12/17, at which time it was recommended that patient follow up with cardiology provider within one week. Patient reports today that he did not schedule cardiology appointment, did not attend recently  scheduled sleep study or GI appointments, stating that he can not afford the co-pays for these office visits, and does not wish to attend sleep study.  Pt verbalizes that he plans to attend next scheduled PCP appointment on Friday 07/14/17, stating that he will drive himself to appointment.  Jayquan continues attending regular appointments with his eye doctorwhere he receives injections "behind" his eye to "remove the blood"after his recent eye issue February 10, 2017. Reports that he is able to see in daylight to drive, but continues to have difficulty with seeing/ blurriness in dim light or at night, although he reports today that he believes his vision is improving somewhat.  Reports he missed a recently scheduled eye doctor appointment because the office did not call to remind him of the scheduled appointment; encouraged patient to re-schedule appointment, and he agrees to do so today.   -- Safety/ Falls/ mobility: Patient denies newfalls and continues using cane "as needed," mainly when he "goes out."General fall risks/ prevention reiterated with patient today.  -- self-health management of HTN: Patient is able to discuss general strategies for stroke prevention and denies recent epsiodes unresolved chest pain or other concerning symptoms outside of his baseline status since hospital discharge 05/31/17.  States that he has had a "few episodes" of chest tightness that he reports were relieved by rest/ and/or his "regular medications."  Jammie reports that he has been checking his blood pressures with automatic BP cuff provided to him during last [facility] CM home visit.  Reports BP this morning of 128/90.  Reports weight today of 317 lbs; continues to state that he is unable to weigh himself daily due to his ongoing vision issues, but that he weighs himself as often as he can.  Reports that his weights have been  ranging from 315- 320 lbs today over "the last couple of weeks."  Patient denies further  issues, concerns, or problems today. We discussed that patient has thus far met his previously established [facility] CM goals; however, patient requests an additional home visit to review his home BP values.  Discussed with patient role of [facility] CM, and we scheduled [facility] CCM in-home visit in 2 weeks possibly for case closure.  I confirmed that patient hasmy direct phone number, the main [facility] CM office phone number, and the [facility] CM 24-hour nurse advice phone number should issues arise prior to next scheduled [place] outreach with in-home visit in 2 weeks.  Plan:  Michaelwill take medications as prescribed and will attend scheduled provider appointments.  Michaelwill promptly notify his providers for any new concerns, problems, or issues that arise.  Joe will make his PCP and cardiology providers aware that he is not taking Eliquis as prescribed  Dhanvin will continue monitoring and recording his BP's and weights at home at least twice weekly  [place] outreach to continue with scheduled home visit in 2 weeks.   Oneta Rack, RN, BSN, [employer] [facility] Care Management  [phone]

## 2017-07-06 NOTE — Patient Outreach (Signed)
[facility]) Care Management  07/06/2017  SYMERE MUISE 04-08-1951 [id number]  Patient was initially referred to [facility] CM Pharmacy for medication assistance evaluation with Eliquis, by [facility] RN Su Hilt.   Received a message from [facility] RN [place] on 06/19/17, he no longer needs assistance.  Per review of note from 06/19/17 in chart by [facility] RN Su Hilt, patient stated "he no longer needs [facility] CM Pharmacy referral."  Plan:  [facility] Pharmacist referral per request of patient.   Tommye Standard, PharmD, [facility] Clinical Pharmacist [facility] [phone]

## 2017-07-14 ENCOUNTER — Ambulatory Visit (INDEPENDENT_AMBULATORY_CARE_PROVIDER_SITE_OTHER): Payer: Medicare Other | Admitting: Internal Medicine

## 2017-07-14 ENCOUNTER — Encounter: Payer: Self-pay | Admitting: Internal Medicine

## 2017-07-14 ENCOUNTER — Other Ambulatory Visit: Payer: Self-pay | Admitting: Internal Medicine

## 2017-07-14 VITALS — BP 142/80 | HR 111 | Temp 97.8°F | Ht 70.0 in | Wt 331.8 lb

## 2017-07-14 DIAGNOSIS — M109 Gout, unspecified: Secondary | ICD-10-CM | POA: Diagnosis not present

## 2017-07-14 DIAGNOSIS — I5022 Chronic systolic (congestive) heart failure: Secondary | ICD-10-CM

## 2017-07-14 DIAGNOSIS — I10 Essential (primary) hypertension: Secondary | ICD-10-CM | POA: Diagnosis not present

## 2017-07-14 LAB — URIC ACID: Uric Acid, Serum: 7.9 mg/dL — ABNORMAL HIGH (ref 4.0–7.8)

## 2017-07-14 MED ORDER — ALLOPURINOL 100 MG PO TABS: 200.0000 mg | ORAL_TABLET | Freq: Every day | ORAL | 3 refills | Status: DC

## 2017-07-14 MED ORDER — HYDROCODONE-ACETAMINOPHEN 5-325 MG PO TABS: 1.0000 | ORAL_TABLET | Freq: Four times a day (QID) | ORAL | 0 refills | Status: DC | PRN

## 2017-07-14 MED ORDER — COLCHICINE 0.6 MG PO TABS: 0.6000 mg | ORAL_TABLET | Freq: Two times a day (BID) | ORAL | 0 refills | Status: DC

## 2017-07-14 NOTE — Progress Notes (Signed)
Subjective:    Patient ID: Charles Mckinney, male    DOB: [birth year], 66 y.o.   MRN: [id number]  HPI  66 year old patient who has a history of gouty arthritis.  He presents with a 2 day history of increasing pain and swelling involving his left ankle.  He has had gout in this joint in the past. Remains on allopurinol 100 mg daily. He has a history of compensated systolic heart failure as well as chronic kidney disease  Past Medical History:  Diagnosis Date  . Anxiety   . CHF (congestive heart failure) (HCC)   . Dysrhythmia    ATRIAL FIBRILATION, resolved with cardioversion  . GERD (gastroesophageal reflux disease)   . Hyperlipidemia   . Hypertension   . Hypertensive heart disease   . Kidney stones   . Obesity (BMI 30-39.9)   . Osteoarthritis   . PONV (postoperative nausea and vomiting)   . Shortness of breath dyspnea      Social History   Social History  . Marital status: Widowed    Spouse name: N/A  . Number of children: N/A  . Years of education: N/A   Occupational History  . disabled    Social History Main Topics  . Smoking status: Former Smoker    Quit date: 2000  . Smokeless tobacco: Never Used  . Alcohol use No  . Drug use: No  . Sexual activity: Yes   Other Topics Concern  . Not on file   Social History Narrative  . No narrative on file    Past Surgical History:  Procedure Laterality Date  . CARDIAC CATHETERIZATION N/A 04/09/2015   Procedure: Right/Left Heart Cath and Coronary Angiography;  Surgeon: Orpah Cobb, MD;  Location: [facility] INVASIVE CV LAB CUPID;  Service: Cardiovascular;  Laterality: N/A;  . CARDIOVERSION N/A 01/19/2015   Procedure: CARDIOVERSION;  Surgeon: Othella Boyer, MD;  [facility] ENDOSCOPY;  Service: Cardiovascular;  Laterality: N/A;  pt in Afib, 12:22 synched cardioversion @  120 joules using Propofol 100 mg,IV....unsuccessful, repeated at 200 joules  . CARDIOVERSION N/A 04/02/2015   Procedure: CARDIOVERSION;  Surgeon: Orpah Cobb, MD;  Location: [facility] ENDOSCOPY;  Service: Cardiovascular;  Laterality: N/A;  . CATARACT EXTRACTION    . CLIPPING OF ATRIAL APPENDAGE  04/13/2015   Procedure: CLIPPING OF ATRIAL APPENDAGE;  Surgeon: Alleen Borne, MD;  Location: [facility] OR;  Service: Open Heart Surgery;;  . CORONARY ARTERY BYPASS GRAFT N/A 04/13/2015   Procedure: CORONARY ARTERY BYPASS GRAFTING (CABG);  Surgeon: Alleen Borne, MD;  Location: [facility] OR;  Service: Open Heart Surgery;  Laterality: N/A;  . TEE WITHOUT CARDIOVERSION N/A 04/13/2015   Procedure: TRANSESOPHAGEAL ECHOCARDIOGRAM (TEE);  Surgeon: Alleen Borne, MD;  Location: [facility] OR;  Service: Open Heart Surgery;  Laterality: N/A;    Family History  Problem Relation Age of Onset  . Diabetes Mother   . Heart disease Father   . Congestive Heart Failure Sister   . Stroke Brother   . Heart disease Brother   . Diabetes Brother     Allergies  Allergen Reactions  . Niacin And Related Other (See Comments)    FLUSHING  . Xarelto [Rivaroxaban] Other (See Comments)    Cause bloody stools  . Penicillins Itching, Rash and Other (See Comments)    Has patient had a PCN reaction causing immediate rash, facial/tongue/throat swelling, SOB or lightheadedness with hypotension: Yes Has patient had a PCN reaction causing severe rash involving mucus membranes or skin necrosis: No  Has patient had a PCN reaction that required hospitalization: No Has patient had a PCN reaction occurring within the last 10 years: No If all of the above answers are "NO", then may proceed with Cephalosporin use.     Current Outpatient Prescriptions on File Prior to Visit  Medication Sig Dispense Refill  . acetaminophen (TYLENOL) 500 MG tablet Take 1,000 mg by mouth every 6 (six) hours as needed for moderate pain. Reported on 03/11/2016    . allopurinol (ZYLOPRIM) 100 MG tablet TAKE 1 TABLET(100 MG) BY MOUTH DAILY 90 tablet 0  . amiodarone (PACERONE) 200 MG tablet TAKE 1 TABLET(200 MG) BY MOUTH DAILY 90 tablet  0  . apixaban (ELIQUIS) 2.5 MG TABS tablet Take 2.5 mg by mouth 2 (two) times daily.     Marland Kitchen atorvastatin (LIPITOR) 40 MG tablet Take 1 tablet (40 mg total) by mouth daily at 6 PM. (Patient taking differently: Take 40 mg by mouth daily. ) 30 tablet 0  . carvedilol (COREG) 6.25 MG tablet TAKE 1 TABLET(6.25 MG) BY MOUTH TWICE DAILY WITH A MEAL 180 tablet 0  . Cholecalciferol (VITAMIN D PO) Take 1 tablet by mouth daily.    . fluticasone (FLONASE) 50 MCG/ACT nasal spray Place 1 spray into both nostrils daily.    . furosemide (LASIX) 80 MG tablet Take 80 mg by mouth 2 (two) times daily.    Marland Kitchen losartan (COZAAR) 25 MG tablet Take 1 tablet (25 mg total) by mouth daily. 30 tablet 0  . nitroGLYCERIN (NITROSTAT) 0.4 MG SL tablet Place 0.4 mg under the tongue every 5 (five) minutes as needed for chest pain.    . potassium chloride SA (K-DUR,KLOR-CON) 20 MEQ tablet Take 1 tablet (20 mEq total) by mouth 2 (two) times daily.    Marland Kitchen PROAIR HFA 108 (90 Base) MCG/ACT inhaler INHALE 1 PUFF INTO THE LUNGS EVERY 6 HOURS AS NEEDED FOR WHEEZING OR SHORTNESS OF BREATH 8.5 g 0   No current facility-administered medications on file prior to visit.     BP (!) 142/80 (BP Location: Left Arm, Patient Position: Sitting, Cuff Size: Normal)   Pulse (!) 111   Temp 97.8 F (36.6 C) (Oral)   Ht 5\' 10"  (1.778 m)   Wt (!) 331 lb 12.8 oz (150.5 kg)   SpO2 97%   BMI 47.61 kg/m     Review of Systems  Constitutional: Negative for appetite change, chills, fatigue and fever.  HENT: Negative for congestion, dental problem, ear pain, hearing loss, sore throat, tinnitus, trouble swallowing and voice change.   Eyes: Negative for pain, discharge and visual disturbance.  Respiratory: Negative for cough, chest tightness, wheezing and stridor.   Cardiovascular: Negative for chest pain, palpitations and leg swelling.  Gastrointestinal: Negative for abdominal distention, abdominal pain, blood in stool, constipation, diarrhea, nausea and  vomiting.  Genitourinary: Negative for difficulty urinating, discharge, flank pain, genital sores, hematuria and urgency.  Musculoskeletal: Positive for arthralgias and joint swelling. Negative for back pain, gait problem, myalgias and neck stiffness.  Skin: Negative for rash.  Neurological: Negative for dizziness, syncope, speech difficulty, weakness, numbness and headaches.  Hematological: Negative for adenopathy. Does not bruise/bleed easily.  Psychiatric/Behavioral: Negative for behavioral problems and dysphoric mood. The patient is not nervous/anxious.        Objective:   Physical Exam  Constitutional: He appears well-developed and well-nourished. No distress.  Blood pressure 140/80 Pulse 100  Cardiovascular:  Irregular rhythm  Pulmonary/Chest: Effort normal and breath sounds normal.  Musculoskeletal:  Minimal  pedal edema Pain and tenderness about the left ankle          Assessment & Plan:   Gouty arthritis.  Will check a uric acid level.  Consider up titration of allopurinol; will treat with colchicine  Compensated systolic heart failure Chronic kidney disease.  Will avoid anti-inflammatory medications  Rogelia Boga

## 2017-07-14 NOTE — Patient Instructions (Addendum)

## 2017-07-19 ENCOUNTER — Encounter: Payer: Self-pay | Admitting: *Deleted

## 2017-07-19 ENCOUNTER — Other Ambulatory Visit: Payer: Self-pay | Admitting: *Deleted

## 2017-07-19 NOTE — Patient Outreach (Signed)
Charles Mckinney) Care Management  [facility] CM Routine Home Visit/ Case Closure 07/19/2017  Charles Mckinney 1951-04-05 [id number]  [name] is an 66 y.o. male previously active with [facility] CM; patient was re-referred to [facility] for transition of care after recent hospitalization May 22-24, 2018 for TIA/ gait imbalance, and was discharged home with home health services through [facility], which is now completed.Patient has history including, but not limited to, CHF, CAD with CABG x 4 in 2016, paroxysmal Atrial fibrillation, HTN, HLD, and morbid obesity. HIPAA/ identity verified with patient in person todayat his home, and patient isin no distress throughout entirety of today's 35 minute home visit.  Subjective: "I think I am doing okay; my gout is better, and my vision is slowly getting better too."  Assessment:  Charles Mckinney has recuperated well after his recent hospitalization.  Charles Mckinney is able to verbalize signs/ symptoms of MI and CVA, as well as corresponding action plans for both.  Charles Mckinney has been checking his blood pressures and obtaining weights at home weekly.  Charles Mckinney agrees that he is doing well and is ready for discharge from [place] program.  Today, Charles Mckinney reports that he "is doing better," and he denies pain, concerns,or problems today. Patient states that he has contiued "taking it easy and staying out of the heat;"  Reports a recent gout flare, which was treated by his PCP during recent office visit 07/14/17, and states that his gout is now completely resolved.  -- Has all medications and is taking as prescribed.[name] again verbalizes a good general/ overall understanding of his current medication regimen, including purpose, dosing, and scheduling of medications.  Reports that he has continued taking Eliquis QD (ordered BID); patient reports that he isn't sure if his cardiologist is aware that he has only been taking this  medication once daily; I again encouraged Charles Mckinney to communicate this to cardiologist, explaining that I had notified his providers of this last month.  Patient denies further questions, concerns around medications today.  -- No scheduled upcoming provider appointments noted from review of EMR; patient reports he has scheduled eye specialists appointment this Friday 07/21/17, and he reports he will transport himself to this appointment.  -- Safety/ Falls/ mobility: Patient denies newfalls and continues using cane "as needed," mainly when he "goes out."General fall risks/ prevention reiterated with patient today.  -- self-health management of HTN: Patient is able to discuss general strategies for stroke prevention and denies epsiodes chest pain or other concerning symptoms since hospital discharge 05/31/17. Reports intermittent "chest tightness" (denies today), which he states is resolved spontaneously without intervention, or with use of NTG SL prn.  Denies having had to take NTG recently.  Patient has been able to monitor/ record blood pressures "about once a week," since being provided automatic BP cuff during last [place] home visit.  Patient is able to accurately verbalize strategies for self-health management of HTN, and verbalizes signs/ symptoms MI/ CVA, along with action plan for both.   Patient denies further issues, concerns, or problems today. We discussed [facility] CM case closure, and acknowledged that Charles Mckinney has thus far met his previously established [facility] CCM goals; Hartley agrees today that he is ready for [facility] CCM case closure.  I confirmed that patient hasmy direct phone number, the main [facility] CM office phone number, and the [facility] CM 24-hour nurse advice phone number should issues arise in the future.  Objective:    BP 122/60  Pulse 74   Resp 16   Wt (!) 320 lb (145.2 kg) Comment: From last weight recorded by pt 07/05/17  SpO2 95%   BMI 45.92 kg/m   Review of Systems   Constitutional: Negative.   Eyes: Positive for blurred vision.       Secondary to recent (L) eye issue in March 2018; sees eye specialist regularly; able to drive, "not able to read well;"  Next eye doctor appointment scheduled July 21, 2017  Respiratory: Negative.  Negative for cough, shortness of breath and wheezing.   Cardiovascular: Negative.  Negative for chest pain and leg swelling.       Intermittent "chest tightness;" denies today  Gastrointestinal: Negative.  Negative for abdominal pain and nausea.  Genitourinary: Negative.   Musculoskeletal: Negative for falls.  Neurological: Negative.   Psychiatric/Behavioral: Negative.  Negative for depression. The patient is not nervous/anxious.    Physical Exam  Constitutional: He is oriented to person, place, and time. He appears well-developed and well-nourished. No distress.  Cardiovascular: Normal rate, regular rhythm, normal heart sounds and intact distal pulses.   Respiratory: Effort normal. No respiratory distress. He has no wheezes. He has no rales.  GI: Soft. Bowel sounds are normal.  Musculoskeletal: He exhibits no edema.  Neurological: He is alert and oriented to person, place, and time.  Skin: Skin is warm and dry. No erythema.  Psychiatric: He has a normal mood and affect. His behavior is normal. Judgment and thought content normal.    Encounter Medications:   Outpatient Encounter Prescriptions as of 07/19/2017  Medication Sig Note  . allopurinol (ZYLOPRIM) 100 MG tablet Take 2 tablets (200 mg total) by mouth daily. 07/19/2017: Reports taking BID-- states PCP knows how he is taking, states he discussed with PCP 07/14/17  . acetaminophen (TYLENOL) 500 MG tablet Take 1,000 mg by mouth every 6 (six) hours as needed for moderate pain. Reported on 03/11/2016   . amiodarone (PACERONE) 200 MG tablet TAKE 1 TABLET(200 MG) BY MOUTH DAILY   . apixaban (ELIQUIS) 2.5 MG TABS tablet Take 2.5 mg by mouth 2 (two) times daily.  06/19/2017:  Reports still taking only QD (ordered BID)    . atorvastatin (LIPITOR) 40 MG tablet Take 1 tablet (40 mg total) by mouth daily at 6 PM. (Patient taking differently: Take 40 mg by mouth daily. )   . carvedilol (COREG) 6.25 MG tablet TAKE 1 TABLET(6.25 MG) BY MOUTH TWICE DAILY WITH A MEAL   . Cholecalciferol (VITAMIN D PO) Take 1 tablet by mouth daily.   . colchicine 0.6 MG tablet TAKE 1 TABLET(0.6 MG) BY MOUTH TWICE DAILY (Patient not taking: Reported on 07/19/2017) 07/19/2017: Patient reports gout is better, so he is no longer taking  . fluticasone (FLONASE) 50 MCG/ACT nasal spray Place 1 spray into both nostrils daily.   . furosemide (LASIX) 80 MG tablet Take 80 mg by mouth 2 (two) times daily.   Marland Kitchen HYDROcodone-acetaminophen (NORCO/VICODIN) 5-325 MG tablet Take 1 tablet by mouth every 6 (six) hours as needed for moderate pain. (Patient not taking: Reported on 07/19/2017) 07/19/2017: Reports no longer taking, gout has improved  . losartan (COZAAR) 25 MG tablet Take 1 tablet (25 mg total) by mouth daily.   . nitroGLYCERIN (NITROSTAT) 0.4 MG SL tablet Place 0.4 mg under the tongue every 5 (five) minutes as needed for chest pain. 06/06/2017: Reports has not needed recently  . potassium chloride SA (K-DUR,KLOR-CON) 20 MEQ tablet Take 1 tablet (20 mEq total) by mouth 2 (  two) times daily.   Marland Kitchen PROAIR HFA 108 (90 Base) MCG/ACT inhaler INHALE 1 PUFF INTO THE LUNGS EVERY 6 HOURS AS NEEDED FOR WHEEZING OR SHORTNESS OF BREATH    No facility-administered encounter medications on file as of 07/19/2017.     Functional Status:   In your present state of health, do you have any difficulty performing the following activities: 05/30/2017 04/28/2017  Hearing? N N  Vision? N Y  Comment - "At times" related to "recent eye issue" which patient cannot tell me exactly what this issue is today  Difficulty concentrating or making decisions? N N  Walking or climbing stairs? N Y  Comment - -  Dressing or bathing? N N  Doing  errands, shopping? N N  Preparing Food and eating ? - N  Using the Toilet? - N  In the past six months, have you accidently leaked urine? - N  Do you have problems with loss of bowel control? - N  Managing your Medications? - N  Managing your Finances? - N  Housekeeping or managing your Housekeeping? - N  Some recent data might be hidden    Fall/Depression Screening:    Fall Risk  07/06/2017 06/19/2017 06/06/2017  Falls in the past year? (No Data) (No Data) (No Data)  Comment No new falls reported today No new falls reported today Patient denies new falls today  Risk for fall due to : - - Impaired vision   PHQ 2/9 Scores 05/17/2017 12/16/2016 09/25/2015  PHQ - 2 Score 1 1 0   Plan:  Will close [place] CM case, as patient has successfully met his previously established goals, and will make patient's PCP aware of same.   [facility] CM Care Plan Problem Two     Most Recent Value  Care Plan Problem Two  Knowledge deficit related to self-health management for stroke prevention/ HTN  Role Documenting the Problem Two  Care Management Coordinator  Care Plan for Problem Two  Not Active  Interventions for Problem Two Long Term Goal   Utilizing teachback method, reviewed educational material previously provided on home BP monitoring,  reviewed home BP measurements since last [facility] CM home visit,  obtained clinical status update,  [facility] CM case closure  [facility] Long Term Goal  Over the next 14 days, patient will be able to verbalize 3 strategies for stroke prevention, as evidenced by patient reporting during [facility] RN CM outreach,  patient will continue monitoirng and recording BP's at home, as evidnced by review of same during [facility] RN CM outreach  [facility] Long Term Goal Start Date  07/06/17  [facility] Long Term Goal Met Date  07/19/17     It has been a pleasure participating in [place] care,  Oneta Rack, RN, BSN, [employer] Coordinator [facility] Care Management  [phone]

## 2017-07-21 DIAGNOSIS — H353221 Exudative age-related macular degeneration, left eye, with active choroidal neovascularization: Secondary | ICD-10-CM | POA: Diagnosis not present

## 2017-07-21 DIAGNOSIS — H26492 Other secondary cataract, left eye: Secondary | ICD-10-CM | POA: Diagnosis not present

## 2017-08-14 ENCOUNTER — Other Ambulatory Visit: Payer: Self-pay | Admitting: Internal Medicine

## 2017-08-14 DIAGNOSIS — I5022 Chronic systolic (congestive) heart failure: Secondary | ICD-10-CM

## 2017-08-25 ENCOUNTER — Other Ambulatory Visit: Payer: Self-pay | Admitting: Internal Medicine

## 2017-10-04 ENCOUNTER — Encounter: Payer: Self-pay | Admitting: Pulmonary Disease

## 2017-10-04 ENCOUNTER — Ambulatory Visit (INDEPENDENT_AMBULATORY_CARE_PROVIDER_SITE_OTHER): Payer: Medicare Other | Admitting: Pulmonary Disease

## 2017-10-04 VITALS — BP 126/72 | HR 87 | Ht 70.0 in | Wt 335.0 lb

## 2017-10-04 DIAGNOSIS — G4733 Obstructive sleep apnea (adult) (pediatric): Secondary | ICD-10-CM | POA: Insufficient documentation

## 2017-10-04 HISTORY — DX: Obstructive sleep apnea (adult) (pediatric): G47.33

## 2017-10-04 NOTE — Assessment & Plan Note (Signed)
Weight loss was encouraged His dyspnea is likely related to morbid obesity and deconditioning and chronic systolic heart failure. I doubt that he has obstructive airways disease.  Similarly I doubt that he has interstitial lung disease although he is right lower lobe crackles, CT abdomen from 09/2015 does not show any evidence of fibrosis

## 2017-10-04 NOTE — Patient Instructions (Signed)
Home sleep study Will need titration study after

## 2017-10-04 NOTE — Assessment & Plan Note (Signed)
Given excessive daytime somnolence, narrow pharyngeal exam, witnessed apneas & loud snoring, obstructive sleep apnea is very likely & an overnight polysomnogram will be scheduled as a split study. The pathophysiology of obstructive sleep apnea , it's cardiovascular consequences & modes of treatment including CPAP were discused with the patient in detail & they evidenced understanding.   He has not tolerated CPAP during hospital and will likely need bilevel therapy.  Will attempt home sleep study since he is resistant to going to sleep center

## 2017-10-04 NOTE — Progress Notes (Signed)
Subjective:    Patient ID: Charles Mckinney, male    DOB: [birth year], 66 y.o.   MRN: [id number]  HPI  Chief Complaint  Patient presents with  . Sleep Consult    Possible OSA. Patient states he can not sleep flat at night. Has a history of CHF.     66 year old morbidly obese man with chronic systolic heart failure presents for evaluation of sleep disordered breathing.  He underwent CABG in 2016, had an ER visit in 05/2017 for chest pain which I reviewed. His chronic atrial fibrillation Eliquis  He sleeps in a recliner angle around 60 degrees since his CABG in 2016. Epworth sleepiness score is 6 but I suspect that he is under reporting.  He reports shortness of breath even after walking a few yards on level ground.  He is morbidly obese with a weight of 335 pounds and a BMI of 48. He smoked less than 20 pack years before he quit in 2000. Bedtime is around midnight, sleep latency is a few minutes, reports nocturia about 2-3 times, takes 80 mg Lasix twice a day his evening doses around 5 PM, out of bed between 8 and 9 AM feeling tired with dryness of mouth but denies headaches.  Loud snoring has been noted by his wife and family members  There is no history suggestive of cataplexy, sleep paralysis or parasomnias On a prior admission he was placed on CPAP in the hospital and did not tolerate this at all since he could not exam against the machine  Significant tests/ events reviewed   Echo 05/2017 showed EF of 35%. 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  Chest x-ray 05/2017 shows cardiomegaly and interstitial changes. CT abdomen 09/2015 shows clear lung fields    Past Medical History:  Diagnosis Date  . Anxiety   . CHF (congestive heart failure) (HCC)   . Dysrhythmia    ATRIAL FIBRILATION, resolved with cardioversion  . GERD (gastroesophageal reflux disease)   . Hyperlipidemia   . Hypertension   . Hypertensive heart disease   . Kidney  stones   . Obesity (BMI 30-39.9)   . Osteoarthritis   . PONV (postoperative nausea and vomiting)   . Shortness of breath dyspnea      Past Surgical History:  Procedure Laterality Date  . CARDIAC CATHETERIZATION N/A 04/09/2015   Procedure: Right/Left Heart Cath and Coronary Angiography;  Surgeon: Orpah Cobb, MD;  Location: [facility] INVASIVE CV LAB CUPID;  Service: Cardiovascular;  Laterality: N/A;  . CARDIOVERSION N/A 01/19/2015   Procedure: CARDIOVERSION;  Surgeon: Othella Boyer, MD;  Location: [facility] ENDOSCOPY;  Service: Cardiovascular;  Laterality: N/A;  pt in Afib, 12:22 synched cardioversion @  120 joules using Propofol 100 mg,IV....unsuccessful, repeated at 200 joules  . CARDIOVERSION N/A 04/02/2015   Procedure: CARDIOVERSION;  Surgeon: Orpah Cobb, MD;  Location: [facility] ENDOSCOPY;  Service: Cardiovascular;  Laterality: N/A;  . CATARACT EXTRACTION    . CLIPPING OF ATRIAL APPENDAGE  04/13/2015   Procedure: CLIPPING OF ATRIAL APPENDAGE;  Surgeon: Alleen Borne, MD;  Location: [facility] OR;  Service: Open Heart Surgery;;  . CORONARY ARTERY BYPASS GRAFT N/A 04/13/2015   Procedure: CORONARY ARTERY BYPASS GRAFTING (CABG);  Surgeon: Alleen Borne, MD;  Location: [facility] OR;  Service: Open Heart Surgery;  Laterality: N/A;  . TEE WITHOUT CARDIOVERSION N/A 04/13/2015   Procedure: TRANSESOPHAGEAL ECHOCARDIOGRAM (TEE);  Surgeon: Alleen Borne, MD;  [facility];  Service: Open Heart Surgery;  Laterality: N/A;     Allergies  Allergen Reactions  . Niacin And Related Other (See Comments)    FLUSHING  . Xarelto [Rivaroxaban] Other (See Comments)    Cause bloody stools  . Penicillins Itching, Rash and Other (See Comments)    Has patient had a PCN reaction causing immediate rash, facial/tongue/throat swelling, SOB or lightheadedness with hypotension: Yes Has patient had a PCN reaction causing severe rash involving mucus membranes or skin necrosis: No Has patient had a PCN reaction that required hospitalization:  No Has patient had a PCN reaction occurring within the last 10 years: No If all of the above answers are "NO", then may proceed with Cephalosporin use.      Social History   Social History  . Marital status: Widowed    Spouse name: N/A  . Number of children: N/A  . Years of education: N/A   Occupational History  . disabled    Social History Main Topics  . Smoking status: Former Smoker    Quit date: 2000  . Smokeless tobacco: Never Used  . Alcohol use No  . Drug use: No  . Sexual activity: Yes   Other Topics Concern  . Not on file   Social History Narrative  . No narrative on file      Review of Systems Positive for irregular heartbeat, shortness of breath with activity, indigestion, tooth problems, feet swelling and joint stiffness  Constitutional: negative for anorexia, fevers and sweats  Eyes: negative for irritation, redness and visual disturbance  Ears, nose, mouth, throat, and face: negative for earaches, epistaxis, nasal congestion and sore throat  Respiratory: negative for cough,  sputum and wheezing  Cardiovascular: negative for chest pain, dyspnea, lower extremity edema, orthopnea, palpitations and syncope  Gastrointestinal: negative for abdominal pain, constipation, diarrhea, melena, nausea and vomiting  Genitourinary:negative for dysuria, frequency and hematuria  Hematologic/lymphatic: negative for bleeding, easy bruising and lymphadenopathy  Musculoskeletal:negative for arthralgias, muscle weakness and stiff joints  Neurological: negative for coordination problems, gait problems, headaches and weakness  Endocrine: negative for diabetic symptoms including polydipsia, polyuria and weight loss     Objective:   Physical Exam   Gen. Pleasant, morbidly obese, in no distress, normal affect ENT -  no post nasal drip, class 2-3 airway Neck: No JVD, no thyromegaly, no carotid bruits Lungs: no use of accessory muscles, no dullness to percussion, RLL rales ,  no rhonchi  Cardiovascular: Rhythm regular, heart sounds  normal, no murmurs or gallops, 2+ peripheral edema Abdomen: soft and non-tender, no hepatosplenomegaly, BS normal. Musculoskeletal: No deformities, no cyanosis or clubbing Neuro:  alert, non focal, no tremors        Assessment & Plan:

## 2017-10-06 DIAGNOSIS — H353221 Exudative age-related macular degeneration, left eye, with active choroidal neovascularization: Secondary | ICD-10-CM | POA: Diagnosis not present

## 2017-10-23 DIAGNOSIS — R072 Precordial pain: Secondary | ICD-10-CM | POA: Diagnosis not present

## 2017-10-23 DIAGNOSIS — I482 Chronic atrial fibrillation: Secondary | ICD-10-CM | POA: Diagnosis not present

## 2017-10-23 DIAGNOSIS — J208 Acute bronchitis due to other specified organisms: Secondary | ICD-10-CM | POA: Diagnosis not present

## 2017-10-30 ENCOUNTER — Other Ambulatory Visit: Payer: Self-pay | Admitting: Internal Medicine

## 2017-10-30 ENCOUNTER — Ambulatory Visit: Payer: Self-pay

## 2017-10-30 NOTE — Telephone Encounter (Signed)
  Reason for Disposition . [1] Continuous (nonstop) coughing interferes with work or school AND [2] no improvement using cough treatment per Care Advice  Answer Assessment - Initial Assessment Questions 1. ONSET: "When did the cough begin?"      Last Monday 2. SEVERITY: "How bad is the cough today?"      It's still bad. 3. RESPIRATORY DISTRESS: "Describe your breathing."      Some shortness of breath 4. FEVER: "Do you have a fever?" If so, ask: "What is your temperature, how was it measured, and when did it start?"     No 5. SPUTUM: "Describe the color of your sputum" (clear, white, yellow, green)     White 6. HEMOPTYSIS: "Are you coughing up any blood?" If so ask: "How much?" (flecks, streaks, tablespoons, etc.)     No blood 7. CARDIAC HISTORY: "Do you have any history of heart disease?" (e.g., heart attack, congestive heart failure)      Congestive heart failure 8. LUNG HISTORY: "Do you have any history of lung disease?"  (e.g., pulmonary embolus, asthma, emphysema)     Uses an inhaler sometimes 9. PE RISK FACTORS: "Do you have a history of blood clots?" (or: recent major surgery, recent prolonged travel, bedridden )     No 10. OTHER SYMPTOMS: "Do you have any other symptoms?" (e.g., runny nose, wheezing, chest pain)       Runny nose, Some wheezing, no swelling 11. PREGNANCY: "Is there any chance you are pregnant?" "When was your last menstrual period?"       No 12. TRAVEL: "Have you traveled out of the country in the last month?" (e.g., travel history, exposures)        No  Protocols used: COUGH - ACUTE PRODUCTIVE-A-AH Reports cough is his worse symptom. Denies fever. Has an appointment for 10/31/17.

## 2017-10-31 ENCOUNTER — Encounter: Payer: Self-pay | Admitting: Internal Medicine

## 2017-10-31 ENCOUNTER — Ambulatory Visit (INDEPENDENT_AMBULATORY_CARE_PROVIDER_SITE_OTHER): Payer: Medicare Other | Admitting: Internal Medicine

## 2017-10-31 VITALS — BP 150/82 | HR 104 | Temp 97.9°F | Ht 70.0 in | Wt 334.8 lb

## 2017-10-31 DIAGNOSIS — B9789 Other viral agents as the cause of diseases classified elsewhere: Secondary | ICD-10-CM | POA: Diagnosis not present

## 2017-10-31 DIAGNOSIS — I5022 Chronic systolic (congestive) heart failure: Secondary | ICD-10-CM

## 2017-10-31 DIAGNOSIS — G4733 Obstructive sleep apnea (adult) (pediatric): Secondary | ICD-10-CM | POA: Diagnosis not present

## 2017-10-31 DIAGNOSIS — J069 Acute upper respiratory infection, unspecified: Secondary | ICD-10-CM

## 2017-10-31 MED ORDER — ALBUTEROL SULFATE HFA 108 (90 BASE) MCG/ACT IN AERS: INHALATION_SPRAY | RESPIRATORY_TRACT | 3 refills | Status: DC

## 2017-10-31 MED ORDER — HYDROCODONE-ACETAMINOPHEN 5-325 MG PO TABS: 1.0000 | ORAL_TABLET | Freq: Four times a day (QID) | ORAL | 0 refills | Status: DC | PRN

## 2017-10-31 NOTE — Progress Notes (Signed)
Subjective:    Patient ID: Charles Mckinney, male    DOB: 1951/05/10, 66 y.o.   MRN: [id number]  HPI 66 year old patient who presents with a 10-day history of rhinorrhea cough. He has been followed by pulmonary medicine with a history of OSA. Denies any fever or wheezing.  He has been using Mucinex and NyQuil with some benefit. He states that he was treated with azithromycin 10 days ago  Past Medical History:  Diagnosis Date  . Anxiety   . CHF (congestive heart failure) (HCC)   . Dysrhythmia    ATRIAL FIBRILATION, resolved with cardioversion  . GERD (gastroesophageal reflux disease)   . Hyperlipidemia   . Hypertension   . Hypertensive heart disease   . Kidney stones   . Obesity (BMI 30-39.9)   . Osteoarthritis   . PONV (postoperative nausea and vomiting)   . Shortness of breath dyspnea      Social History   Socioeconomic History  . Marital status: Widowed    Spouse name: Not on file  . Number of children: Not on file  . Years of education: Not on file  . Highest education level: Not on file  Social Needs  . Financial resource strain: Not on file  . Food insecurity - worry: Not on file  . Food insecurity - inability: Not on file  . Transportation needs - medical: Not on file  . Transportation needs - non-medical: Not on file  Occupational History  . Occupation: disabled  Tobacco Use  . Smoking status: Former Smoker    Last attempt to quit: 2000    Years since quitting: 18.9  . Smokeless tobacco: Never Used  Substance and Sexual Activity  . Alcohol use: No  . Drug use: No  . Sexual activity: Yes  Other Topics Concern  . Not on file  Social History Narrative  . Not on file    Past Surgical History:  Procedure Laterality Date  . CARDIAC CATHETERIZATION N/A 04/09/2015   Procedure: Right/Left Heart Cath and Coronary Angiography;  Surgeon: Orpah Cobb, MD;  Location: [facility];  Service: Cardiovascular;  Laterality: N/A;  . CARDIOVERSION N/A  01/19/2015   Procedure: CARDIOVERSION;  Surgeon: Othella Boyer, MD;  Location: [facility] ENDOSCOPY;  Service: Cardiovascular;  Laterality: N/A;  pt in Afib, 12:22 synched cardioversion @  120 joules using Propofol 100 mg,IV....unsuccessful, repeated at 200 joules  . CARDIOVERSION N/A 04/02/2015   Procedure: CARDIOVERSION;  Surgeon: Orpah Cobb, MD;  Location: [facility] ENDOSCOPY;  Service: Cardiovascular;  Laterality: N/A;  . CATARACT EXTRACTION    . CLIPPING OF ATRIAL APPENDAGE  04/13/2015   Procedure: CLIPPING OF ATRIAL APPENDAGE;  Surgeon: Alleen Borne, MD;  Location: [facility] OR;  Service: Open Heart Surgery;;  . CORONARY ARTERY BYPASS GRAFT N/A 04/13/2015   Procedure: CORONARY ARTERY BYPASS GRAFTING (CABG);  Surgeon: Alleen Borne, MD;  Location: [facility] OR;  Service: Open Heart Surgery;  Laterality: N/A;  . TEE WITHOUT CARDIOVERSION N/A 04/13/2015   Procedure: TRANSESOPHAGEAL ECHOCARDIOGRAM (TEE);  Surgeon: Alleen Borne, MD;  Location: [facility] OR;  Service: Open Heart Surgery;  Laterality: N/A;    Family History  Problem Relation Age of Onset  . Diabetes Mother   . Heart disease Father   . Congestive Heart Failure Sister   . Stroke Brother   . Heart disease Brother   . Diabetes Brother     Allergies  Allergen Reactions  . Niacin And Related Other (See Comments)  FLUSHING  . Xarelto [Rivaroxaban] Other (See Comments)    Cause bloody stools  . Penicillins Itching, Rash and Other (See Comments)    Has patient had a PCN reaction causing immediate rash, facial/tongue/throat swelling, SOB or lightheadedness with hypotension: Yes Has patient had a PCN reaction causing severe rash involving mucus membranes or skin necrosis: No Has patient had a PCN reaction that required hospitalization: No Has patient had a PCN reaction occurring within the last 10 years: No If all of the above answers are "NO", then may proceed with Cephalosporin use.     Current Outpatient Medications on File Prior to Visit  Medication  Sig Dispense Refill  . acetaminophen (TYLENOL) 500 MG tablet Take 1,000 mg by mouth every 6 (six) hours as needed for moderate pain. Reported on 03/11/2016    . allopurinol (ZYLOPRIM) 100 MG tablet Take 2 tablets (200 mg total) by mouth daily. 180 tablet 3  . amiodarone (PACERONE) 200 MG tablet TAKE 1 TABLET(200 MG) BY MOUTH DAILY 90 tablet 0  . apixaban (ELIQUIS) 2.5 MG TABS tablet Take 2.5 mg by mouth 2 (two) times daily.     Marland Kitchen. atorvastatin (LIPITOR) 40 MG tablet Take 1 tablet (40 mg total) by mouth daily at 6 PM. (Patient taking differently: Take 40 mg by mouth daily. ) 30 tablet 0  . carvedilol (COREG) 6.25 MG tablet TAKE 1 TABLET(6.25 MG) BY MOUTH TWICE DAILY WITH A MEAL 180 tablet 0  . Cholecalciferol (VITAMIN D PO) Take 1 tablet by mouth daily.    . colchicine 0.6 MG tablet TAKE 1 TABLET(0.6 MG) BY MOUTH TWICE DAILY 180 tablet 0  . fluticasone (FLONASE) 50 MCG/ACT nasal spray Place 1 spray into both nostrils daily.    . furosemide (LASIX) 80 MG tablet Take 80 mg by mouth 2 (two) times daily.    Marland Kitchen. HYDROcodone-acetaminophen (NORCO/VICODIN) 5-325 MG tablet Take 1 tablet by mouth every 6 (six) hours as needed for moderate pain. 30 tablet 0  . losartan (COZAAR) 25 MG tablet Take 1 tablet (25 mg total) by mouth daily. 30 tablet 0  . nitroGLYCERIN (NITROSTAT) 0.4 MG SL tablet Place 0.4 mg under the tongue every 5 (five) minutes as needed for chest pain.    . potassium chloride SA (K-DUR,KLOR-CON) 20 MEQ tablet Take 1 tablet (20 mEq total) by mouth 2 (two) times daily.     No current facility-administered medications on file prior to visit.     BP (!) 150/82 (BP Location: Left Arm, Patient Position: Sitting, Cuff Size: Normal)   Pulse (!) 104   Temp 97.9 F (36.6 C) (Oral)   Ht 5\' 10"  (1.778 m)   Wt (!) 334 lb 12.8 oz (151.9 kg)   SpO2 96%   BMI 48.04 kg/m       Review of Systems  Constitutional: Positive for activity change, appetite change and fatigue. Negative for chills and fever.    HENT: Positive for congestion, postnasal drip and rhinorrhea. Negative for dental problem, ear pain, hearing loss, sore throat, tinnitus, trouble swallowing and voice change.   Eyes: Negative for pain, discharge and visual disturbance.  Respiratory: Positive for cough. Negative for chest tightness, wheezing and stridor.   Cardiovascular: Negative for chest pain, palpitations and leg swelling.  Gastrointestinal: Negative for abdominal distention, abdominal pain, blood in stool, constipation, diarrhea, nausea and vomiting.  Genitourinary: Negative for difficulty urinating, discharge, flank pain, genital sores, hematuria and urgency.  Musculoskeletal: Negative for arthralgias, back pain, gait problem, joint swelling, myalgias and  neck stiffness.  Skin: Negative for rash.  Neurological: Negative for dizziness, syncope, speech difficulty, weakness, numbness and headaches.  Hematological: Negative for adenopathy. Does not bruise/bleed easily.  Psychiatric/Behavioral: Negative for behavioral problems and dysphoric mood. The patient is not nervous/anxious.        Objective:   Physical Exam  Constitutional: He is oriented to person, place, and time. He appears well-developed. No distress.  Obese Blood pressure 138/80 O2 saturation 96  HENT:  Head: Normocephalic.  Right Ear: External ear normal.  Left Ear: External ear normal.  Eyes: Conjunctivae and EOM are normal.  Neck: Normal range of motion.  Cardiovascular: Normal rate and normal heart sounds.  Irregular with controlled ventricular response  Pulmonary/Chest: Breath sounds normal. No respiratory distress. He has no wheezes. He has no rales.  Abdominal: Bowel sounds are normal.  Musculoskeletal: Normal range of motion. He exhibits no edema or tenderness.  Neurological: He is alert and oriented to person, place, and time.  Psychiatric: He has a normal mood and affect. His behavior is normal.          Assessment & Plan:   Viral URI  with cough.  Will treat symptomatically Hydration and rest discussed and encouraged Chronic systolic heart failure compensated Essential hypertension stable  Rogelia Boga

## 2017-10-31 NOTE — Patient Instructions (Addendum)
Acute bronchitis symptoms  are generally not helped by antibiotics.  Take over-the-counter expectorants and cough medications such as  Mucinex DM.  Call if there is no improvement in 5 to 7 days or if  you develop worsening cough, fever, or new symptoms, such as shortness of breath or chest pain.  Use hydrocodone-acetaminophen every 6 hours as needed for cough  Hydrate and Humidify  Drink enough water to keep your urine clear or pale yellow. Staying hydrated will help to thin your mucus.  Use a cool mist humidifier to keep the humidity level in your home above 50%.  Inhale steam for 10-15 minutes, 3-4 times a day or as told by your health care provider. You can do this in the bathroom while a hot shower is running.  Limit your exposure to cool or dry air. Rest  Rest as much as possible.  Sleep with your head raised (elevated).  Make sure to get enough sleep each night.

## 2017-11-01 ENCOUNTER — Ambulatory Visit (HOSPITAL_COMMUNITY)
Admission: EM | Admit: 2017-11-01 | Discharge: 2017-11-01 | Disposition: A | Discharge status: Home / Self Care | Payer: Medicare Other | Attending: Family Medicine | Admitting: Family Medicine

## 2017-11-01 ENCOUNTER — Encounter (HOSPITAL_COMMUNITY): Payer: Self-pay | Admitting: Family Medicine

## 2017-11-01 DIAGNOSIS — R05 Cough: Secondary | ICD-10-CM

## 2017-11-01 DIAGNOSIS — R059 Cough, unspecified: Secondary | ICD-10-CM

## 2017-11-01 DIAGNOSIS — J0120 Acute ethmoidal sinusitis, unspecified: Secondary | ICD-10-CM | POA: Diagnosis not present

## 2017-11-01 MED ORDER — HYDROCODONE-HOMATROPINE 5-1.5 MG/5ML PO SYRP: 5.0000 mL | ORAL_SOLUTION | Freq: Four times a day (QID) | ORAL | 0 refills | Status: DC | PRN

## 2017-11-01 MED ORDER — ATORVASTATIN CALCIUM 40 MG PO TABS: 40.0000 mg | ORAL_TABLET | Freq: Every day | ORAL | 3 refills | Status: DC

## 2017-11-01 MED ORDER — DOXYCYCLINE HYCLATE 100 MG PO TABS: 100.0000 mg | ORAL_TABLET | Freq: Two times a day (BID) | ORAL | 0 refills | Status: DC

## 2017-11-01 NOTE — ED Triage Notes (Signed)
Pt presents with recurring cough for over 1 week, states he has already finished a Z-pack and was also given Tylenol and told to take Mucinex DM. Patient reports little to no relief with the cough - productive. Also has runny nose and congestion. Patient also endorses nausea onset this morning, sts he had nausea pills at home he found and they helped. Denies vomiting, no fevers/chills.

## 2017-11-01 NOTE — Discharge Instructions (Signed)
For urgent home needs such as home heating oil, call "[phone]"

## 2017-11-01 NOTE — ED Provider Notes (Signed)
[facility]   [id number] 11/01/17 Arrival Time: 1648   SUBJECTIVE:  Charles Mckinney is a 66 y.o. male who presents to the urgent care with complaint of nausea and dizziness in the context of CAD, dysrhythmia, h/o CHF, GERD, and hypertension.  Patient is a retired Cytogeneticist. He's been running out of fuel and is headed towards heat off until recently because he could not afford the oil.  He's seen 2 different doctors for this ongoing cough and sinus congestion (10 days) and was prescribed acetaminophen by his primary care doctor. Continues to cough much of the night and has significant nasal congestion.   Past Medical History:  Diagnosis Date  . Anxiety   . CHF (congestive heart failure) (HCC)   . Dysrhythmia    ATRIAL FIBRILATION, resolved with cardioversion  . GERD (gastroesophageal reflux disease)   . Hyperlipidemia   . Hypertension   . Hypertensive heart disease   . Kidney stones   . Obesity (BMI 30-39.9)   . Osteoarthritis   . PONV (postoperative nausea and vomiting)   . Shortness of breath dyspnea    Family History  Problem Relation Age of Onset  . Diabetes Mother   . Heart disease Father   . Congestive Heart Failure Sister   . Stroke Brother   . Heart disease Brother   . Diabetes Brother    Social History   Socioeconomic History  . Marital status: Widowed    Spouse name: Not on file  . Number of children: Not on file  . Years of education: Not on file  . Highest education level: Not on file  Social Needs  . Financial resource strain: Not on file  . Food insecurity - worry: Not on file  . Food insecurity - inability: Not on file  . Transportation needs - medical: Not on file  . Transportation needs - non-medical: Not on file  Occupational History  . Occupation: disabled  Tobacco Use  . Smoking status: Former Smoker    Last attempt to quit: 2000    Years since quitting: 18.9  . Smokeless tobacco: Never Used  Substance and Sexual Activity  .  Alcohol use: No  . Drug use: No  . Sexual activity: Yes  Other Topics Concern  . Not on file  Social History Narrative  . Not on file   No outpatient medications have been marked as taking for the 11/01/17 encounter [facility] Encounter).   Allergies  Allergen Reactions  . Niacin And Related Other (See Comments)    FLUSHING  . Xarelto [Rivaroxaban] Other (See Comments)    Cause bloody stools  . Penicillins Itching, Rash and Other (See Comments)    Has patient had a PCN reaction causing immediate rash, facial/tongue/throat swelling, SOB or lightheadedness with hypotension: Yes Has patient had a PCN reaction causing severe rash involving mucus membranes or skin necrosis: No Has patient had a PCN reaction that required hospitalization: No Has patient had a PCN reaction occurring within the last 10 years: No If all of the above answers are "NO", then may proceed with Cephalosporin use.       ROS: As per HPI, remainder of ROS negative.   OBJECTIVE:   Vitals:   11/01/17 1714  BP: 109/73  Pulse: 89  Resp: 16  Temp: 97.7 F (36.5 C)  TempSrc: Oral  SpO2: 94%     General appearance: alert; no distress Eyes: PERRL; EOMI; conjunctiva normal HENT: normocephalic; atraumatic; TMs normal, canal normal, external ears normal  without trauma; nasal mucosa normal; oral mucosa normal Neck: supple Lungs: Fine rales on auscultation bilaterally Heart: regular rate and rhythm Abdomen: soft, non-tender; bowel sounds normal; no masses or organomegaly; no guarding or rebound tenderness Back: no CVA tenderness Extremities: no cyanosis or edema; symmetrical with no gross deformities Skin: warm and dry; 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     ASSESSMENT & PLAN:  1. Acute ethmoidal sinusitis, recurrence not specified   2. Cough     Meds ordered this encounter  Medications  . atorvastatin (LIPITOR) 40 MG tablet    Sig: Take 1 tablet (40 mg total) by mouth daily.    Dispense:  90 tablet    Refill:  3  . doxycycline (VIBRA-TABS) 100 MG tablet    Sig: Take 1 tablet (100 mg total) by mouth 2 (two) times daily.    Dispense:  20 tablet    Refill:  0  . HYDROcodone-homatropine (HYDROMET) 5-1.5 MG/5ML syrup    Sig: Take 5 mLs by mouth every 6 (six) hours as needed for cough.    Dispense:  60 mL    Refill:  0    Reviewed expectations re: course of current medical issues. Questions answered. Outlined signs and symptoms indicating need for more acute intervention. Patient verbalized understanding. After Visit Summary given.    Procedures:      Elvina SidleLauenstein, Iyahna Obriant, MD 11/01/17 1731

## 2017-11-07 ENCOUNTER — Ambulatory Visit (INDEPENDENT_AMBULATORY_CARE_PROVIDER_SITE_OTHER): Payer: Medicare Other | Admitting: Internal Medicine

## 2017-11-07 ENCOUNTER — Encounter: Payer: Self-pay | Admitting: Internal Medicine

## 2017-11-07 VITALS — BP 110/72 | HR 68 | Temp 97.4°F | Resp 18 | Wt 334.2 lb

## 2017-11-07 DIAGNOSIS — I480 Paroxysmal atrial fibrillation: Secondary | ICD-10-CM

## 2017-11-07 DIAGNOSIS — L723 Sebaceous cyst: Secondary | ICD-10-CM

## 2017-11-07 DIAGNOSIS — L089 Local infection of the skin and subcutaneous tissue, unspecified: Secondary | ICD-10-CM

## 2017-11-07 MED ORDER — DOXYCYCLINE HYCLATE 100 MG PO TABS: 100.0000 mg | ORAL_TABLET | Freq: Two times a day (BID) | ORAL | 0 refills | Status: DC

## 2017-11-07 NOTE — Patient Instructions (Addendum)
Take your antibiotic as prescribed until ALL of it is gone, but stop if you develop a rash, swelling, or any side effects of the medication.  Contact our office as soon as possible if  there are side effects of the medication.   Epidermal Cyst An epidermal cyst is a small, painless lump under your skin. It may be called an epidermal inclusion cyst or an infundibular cyst. The cyst contains a grayish-white, bad-smelling substance (keratin). It is important not to pop epidermal cysts yourself. These cysts are usually harmless (benign), but they can get infected. Symptoms of infection may include:  Redness.  Inflammation.  Tenderness.  Warmth.  Fever.  A grayish-white, bad-smelling substance draining from the cyst.  Pus draining from the cyst.  Follow these instructions at home:  Take over-the-counter and prescription medicines only as told by your doctor.  If you were prescribed an antibiotic, use it as told by your doctor. Do not stop using the antibiotic even if you start to feel better.  Keep the area around your cyst clean and dry.  Wear loose, dry clothing.  Do not try to pop your cyst.  Avoid touching your cyst.  Check your cyst every day for signs of infection.  Keep all follow-up visits as told by your doctor. This is important. How is this prevented?  Wear clean, dry, clothing.  Avoid wearing tight clothing.  Keep your skin clean and dry. Shower or take baths every day.  Wash your body with a benzoyl peroxide wash when you shower or bathe. Contact a health care provider if:  Your cyst has symptoms of infection.  Your condition is not improving or is getting worse.  You have a cyst that looks different from other cysts you have had.  You have a fever. Get help right away if:  Redness spreads from the cyst into the surrounding area.   Call or return to clinic prn if these symptoms worsen or fail to improve as anticipated.

## 2017-11-07 NOTE — Progress Notes (Signed)
Subjective:    Patient ID: Charles Mckinney, male    DOB: 09-May-1951, 66 y.o.   MRN: [id number]  HPI  66 year old patient who has a history of infected sebaceous cyst in the past.  These have required I&D.  He was seen at the urgent care last week and was placed on doxycycline for suspected sinus infection.  Over the past 24 hours the abscess involving the left mid back area has decreased in size become much less painful and there is been no further drainage.  He denies any fever or other constitutional complaints.  Past Medical History:  Diagnosis Date  . Anxiety   . CHF (congestive heart failure) (HCC)   . Dysrhythmia    ATRIAL FIBRILATION, resolved with cardioversion  . GERD (gastroesophageal reflux disease)   . Hyperlipidemia   . Hypertension   . Hypertensive heart disease   . Kidney stones   . Obesity (BMI 30-39.9)   . Osteoarthritis   . PONV (postoperative nausea and vomiting)   . Shortness of breath dyspnea      Social History   Socioeconomic History  . Marital status: Widowed    Spouse name: Not on file  . Number of children: Not on file  . Years of education: Not on file  . Highest education level: Not on file  Social Needs  . Financial resource strain: Not on file  . Food insecurity - worry: Not on file  . Food insecurity - inability: Not on file  . Transportation needs - medical: Not on file  . Transportation needs - non-medical: Not on file  Occupational History  . Occupation: disabled  Tobacco Use  . Smoking status: Former Smoker    Last attempt to quit: 2000    Years since quitting: 18.9  . Smokeless tobacco: Never Used  Substance and Sexual Activity  . Alcohol use: No  . Drug use: No  . Sexual activity: Yes  Other Topics Concern  . Not on file  Social History Narrative  . Not on file    Past Surgical History:  Procedure Laterality Date  . CARDIAC CATHETERIZATION N/A 04/09/2015   Procedure: Right/Left Heart Cath and Coronary Angiography;   Surgeon: Orpah Cobb, MD;  Location: [facility]VAS[facility];  Service: Cardiovascular;  Laterality: N/A;  . CARDIOVERSION N/A 01/19/2015   Procedure: CARDIOVERSION;  Surgeon: Othella Boyer, MD;  Location: [facility] ENDOSCOPY;  Service: Cardiovascular;  Laterality: N/A;  pt in Afib, 12:22 synched cardioversion @  120 joules using Propofol 100 mg,IV....unsuccessful, repeated at 200 joules  . CARDIOVERSION N/A 04/02/2015   Procedure: CARDIOVERSION;  Surgeon: Orpah Cobb, MD;  Location: [facility] ENDOSCOPY;  Service: Cardiovascular;  Laterality: N/A;  . CATARACT EXTRACTION    . CLIPPING OF ATRIAL APPENDAGE  04/13/2015   Procedure: CLIPPING OF ATRIAL APPENDAGE;  Surgeon: Alleen Borne, MD;  Location: [facility] OR;  Service: Open Heart Surgery;;  . CORONARY ARTERY BYPASS GRAFT N/A 04/13/2015   Procedure: CORONARY ARTERY BYPASS GRAFTING (CABG);  Surgeon: Alleen Borne, MD;  Location: [facility] OR;  Service: Open Heart Surgery;  Laterality: N/A;  . TEE WITHOUT CARDIOVERSION N/A 04/13/2015   Procedure: TRANSESOPHAGEAL ECHOCARDIOGRAM (TEE);  Surgeon: Alleen Borne, MD;  Location: [facility] OR;  Service: Open Heart Surgery;  Laterality: N/A;    Family History  Problem Relation Age of Onset  . Diabetes Mother   . Heart disease Father   . Congestive Heart Failure Sister   . Stroke Brother   . Heart  disease Brother   . Diabetes Brother     Allergies  Allergen Reactions  . Niacin And Related Other (See Comments)    FLUSHING  . Xarelto [Rivaroxaban] Other (See Comments)    Cause bloody stools  . Penicillins Itching, Rash and Other (See Comments)    Has patient had a PCN reaction causing immediate rash, facial/tongue/throat swelling, SOB or lightheadedness with hypotension: Yes Has patient had a PCN reaction causing severe rash involving mucus membranes or skin necrosis: No Has patient had a PCN reaction that required hospitalization: No Has patient had a PCN reaction occurring within the last 10 years: No If all of the above  answers are "NO", then may proceed with Cephalosporin use.     Current Outpatient Medications on File Prior to Visit  Medication Sig Dispense Refill  . albuterol (PROAIR HFA) 108 (90 Base) MCG/ACT inhaler INHALE 1 PUFF INTO THE LUNGS EVERY 6 HOURS AS NEEDED FOR WHEEZING OR SHORTNESS OF BREATH 8.5 g 3  . allopurinol (ZYLOPRIM) 100 MG tablet Take 2 tablets (200 mg total) by mouth daily. 180 tablet 3  . amiodarone (PACERONE) 200 MG tablet TAKE 1 TABLET(200 MG) BY MOUTH DAILY 90 tablet 0  . apixaban (ELIQUIS) 2.5 MG TABS tablet Take 2.5 mg by mouth 2 (two) times daily.     Marland Kitchen. atorvastatin (LIPITOR) 40 MG tablet Take 1 tablet (40 mg total) by mouth daily. 90 tablet 3  . carvedilol (COREG) 6.25 MG tablet TAKE 1 TABLET(6.25 MG) BY MOUTH TWICE DAILY WITH A MEAL 180 tablet 0  . Cholecalciferol (VITAMIN D PO) Take 1 tablet by mouth daily.    . furosemide (LASIX) 80 MG tablet Take 80 mg by mouth 2 (two) times daily.    Marland Kitchen. HYDROcodone-homatropine (HYDROMET) 5-1.5 MG/5ML syrup Take 5 mLs by mouth every 6 (six) hours as needed for cough. 60 mL 0  . losartan (COZAAR) 25 MG tablet Take 1 tablet (25 mg total) by mouth daily. 30 tablet 0  . nitroGLYCERIN (NITROSTAT) 0.4 MG SL tablet Place 0.4 mg under the tongue every 5 (five) minutes as needed for chest pain.    . potassium chloride SA (K-DUR,KLOR-CON) 20 MEQ tablet Take 1 tablet (20 mEq total) by mouth 2 (two) times daily.    . benzonatate (TESSALON) 100 MG capsule Take 1 capsule by mouth as needed.  0  . colchicine 0.6 MG tablet TAKE 1 TABLET(0.6 MG) BY MOUTH TWICE DAILY (Patient not taking: Reported on 11/07/2017) 180 tablet 0  . fluticasone (FLONASE) 50 MCG/ACT nasal spray Place 1 spray into both nostrils daily.    Marland Kitchen. HYDROcodone-acetaminophen (NORCO/VICODIN) 5-325 MG tablet Take 1 tablet by mouth every 6 (six) hours as needed.  0   No current facility-administered medications on file prior to visit.     BP 110/72 (BP Location: Left Arm, Patient Position:  Sitting, Cuff Size: Large)   Pulse 68   Temp (!) 97.4 F (36.3 C) (Oral)   Resp 18   Wt (!) 334 lb 3.2 oz (151.6 kg)   SpO2 97%   BMI 47.95 kg/m     Review of Systems  Constitutional: Negative for appetite change, chills, fatigue and fever.  HENT: Negative for congestion, dental problem, ear pain, hearing loss, sore throat, tinnitus, trouble swallowing and voice change.   Eyes: Negative for pain, discharge and visual disturbance.  Respiratory: Negative for cough, chest tightness, wheezing and stridor.   Cardiovascular: Negative for chest pain, palpitations and leg swelling.  Gastrointestinal: Negative for abdominal  distention, abdominal pain, blood in stool, constipation, diarrhea, nausea and vomiting.  Genitourinary: Negative for difficulty urinating, discharge, flank pain, genital sores, hematuria and urgency.  Musculoskeletal: Negative for arthralgias, back pain, gait problem, joint swelling, myalgias and neck stiffness.  Skin: Positive for wound. Negative for rash.  Neurological: Negative for dizziness, syncope, speech difficulty, weakness, numbness and headaches.  Hematological: Negative for adenopathy. Does not bruise/bleed easily.  Psychiatric/Behavioral: Negative for behavioral problems and dysphoric mood. The patient is not nervous/anxious.        Objective:   Physical Exam  Constitutional: He appears well-developed and well-nourished. No distress.  Afebrile Blood pressure low normal  Skin:   Resolving furuncle left upper mid back area Presently there is a flat oval eschar about 1 x 2 cm Which is flat.  There is no fluctuance.  The eschar is surrounded by a small rim of erythema.  No drainage          Assessment & Plan:   Infected sebaceous cyst left mid back area.  Patient will complete antibiotic therapy.  Local wound care discussed Chronic atrial fibrillation Chronic anticoagulation  Rogelia Boga

## 2017-11-10 ENCOUNTER — Ambulatory Visit (HOSPITAL_COMMUNITY)
Admission: EM | Admit: 2017-11-10 | Discharge: 2017-11-10 | Disposition: A | Discharge status: Home / Self Care | Payer: Medicare Other | Attending: Internal Medicine | Admitting: Internal Medicine

## 2017-11-10 ENCOUNTER — Encounter (HOSPITAL_COMMUNITY): Payer: Self-pay | Admitting: Emergency Medicine

## 2017-11-10 DIAGNOSIS — L03312 Cellulitis of back [any part except buttock]: Secondary | ICD-10-CM | POA: Diagnosis not present

## 2017-11-10 MED ORDER — CLINDAMYCIN HCL 150 MG PO CAPS: 450.0000 mg | ORAL_CAPSULE | Freq: Three times a day (TID) | ORAL | 0 refills | Status: AC

## 2017-11-10 NOTE — ED Provider Notes (Signed)
[facility]    CSN: [id number] Arrival date & time: 11/10/17  1133     History   Chief Complaint Chief Complaint  Patient presents with  . Abscess    HPI Charles Mckinney is a 66 y.o. male.   Charles Mckinney presents with complaints of persistent drainage wound to his back. He states it has been present for approximately 1 week. Saw his PCP and was told it was not drainable. Was placed on doxycycline for this as well as URI symptoms, completed course 12/4. States his URI symptoms have significantly improved, now with only mild cough. History of afib and is on eliquis. States that the pharmacist told him to hold his amiodorone while taking doxycycline so he has not been taking this. Pain and drainage from back wound, has not worsened but has had minimal improvement. Patient concerned about upcoming snow storm and being able to get out of his house for further treatment if needed.   ROS per HPI.       Past Medical History:  Diagnosis Date  . Anxiety   . CHF (congestive heart failure) (HCC)   . Dysrhythmia    ATRIAL FIBRILATION, resolved with cardioversion  . GERD (gastroesophageal reflux disease)   . Hyperlipidemia   . Hypertension   . Hypertensive heart disease   . Kidney stones   . Obesity (BMI 30-39.9)   . Osteoarthritis   . PONV (postoperative nausea and vomiting)   . Shortness of breath dyspnea     Patient Active Problem List   Diagnosis Date Noted  . OSA (obstructive sleep apnea) 10/04/2017  . Essential hypertension 05/30/2017  . Gout 05/30/2017  . Chest pain 05/29/2017  . CKD (chronic kidney disease), stage III (HCC) 05/29/2017  . TIA (transient ischemic attack) 04/26/2017  . Hypokalemia 04/26/2017  . Prolonged QT interval 04/26/2017  . 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 Acute on chronic systolic congestive heart failure (HCC) 01/19/2015  . Chronic systolic CHF (congestive heart failure) (HCC) 01/19/2015  . Long-term (current) use of anticoagulants 01/19/2015   . Paroxysmal atrial fibrillation (HCC)   . GERD 11/22/2007  . Hyperlipidemia   . Morbid obesity (HCC)   . Hypertensive heart disease     Past Surgical History:  Procedure Laterality Date  . CARDIAC CATHETERIZATION N/A 04/09/2015   Procedure: Right/Left Heart Cath and Coronary Angiography;  Surgeon: Orpah Cobb, MD;  [facility];  Service: Cardiovascular;  Laterality: N/A;  . CARDIOVERSION N/A 01/19/2015   Procedure: CARDIOVERSION;  Surgeon: Othella Boyer, MD;  Location: [facility] ENDOSCOPY;  Service: Cardiovascular;  Laterality: N/A;  pt in Afib, 12:22 synched cardioversion @  120 joules using Propofol 100 mg,IV....unsuccessful, repeated at 200 joules  . CARDIOVERSION N/A 04/02/2015   Procedure: CARDIOVERSION;  Surgeon: Orpah Cobb, MD;  Location: [facility] ENDOSCOPY;  Service: Cardiovascular;  Laterality: N/A;  . CATARACT EXTRACTION    . CLIPPING OF ATRIAL APPENDAGE  04/13/2015   Procedure: CLIPPING OF ATRIAL APPENDAGE;  Surgeon: Alleen Borne, MD;  Location: [facility] OR;  Service: Open Heart Surgery;;  . CORONARY ARTERY BYPASS GRAFT N/A 04/13/2015   Procedure: CORONARY ARTERY BYPASS GRAFTING (CABG);  Surgeon: Alleen Borne, MD;  Location: [facility] OR;  Service: Open Heart Surgery;  Laterality: N/A;  . TEE WITHOUT CARDIOVERSION N/A 04/13/2015   Procedure: TRANSESOPHAGEAL ECHOCARDIOGRAM (TEE);  Surgeon: Alleen Borne, MD;  Location: [facility] OR;  Service: Open Heart Surgery;  Laterality: N/A;       Home Medications  Prior to Admission medications   Medication Sig Start Date End Date Taking? Authorizing Provider  albuterol (PROAIR HFA) 108 (90 Base) MCG/ACT inhaler INHALE 1 PUFF INTO THE LUNGS EVERY 6 HOURS AS NEEDED FOR WHEEZING OR SHORTNESS OF BREATH 10/31/17   [name], MD  allopurinol (ZYLOPRIM) 100 MG tablet Take 2 tablets (200 mg total) by mouth daily. 07/14/17   [name], MD  amiodarone (PACERONE) 200 MG tablet TAKE 1 TABLET(200 MG) BY MOUTH DAILY 08/25/17    [name], MD  apixaban (ELIQUIS) 2.5 MG TABS tablet Take 2.5 mg by mouth 2 (two) times daily.     [provider]  atorvastatin (LIPITOR) 40 MG tablet Take 1 tablet (40 mg total) by mouth daily. 11/01/17   Elvina Sidle, MD  benzonatate (TESSALON) 100 MG capsule Take 1 capsule by mouth as needed. 10/23/17   [provider]  carvedilol (COREG) 6.25 MG tablet TAKE 1 TABLET(6.25 MG) BY MOUTH TWICE DAILY WITH A MEAL 08/14/17   [name], MD  Cholecalciferol (VITAMIN D PO) Take 1 tablet by mouth daily.    [provider]  clindamycin (CLEOCIN) 150 MG capsule Take 3 capsules (450 mg total) by mouth 3 (three) times daily for 10 days. 11/10/17 11/20/17  Georgetta Haber, NP  colchicine 0.6 MG tablet TAKE 1 TABLET(0.6 MG) BY MOUTH TWICE DAILY Patient not taking: Reported on 11/07/2017 07/14/17   [name], MD  doxycycline (VIBRA-TABS) 100 MG tablet Take 1 tablet (100 mg total) by mouth 2 (two) times daily. 11/07/17   [name], MD  fluticasone (FLONASE) 50 MCG/ACT nasal spray Place 1 spray into both nostrils daily.    [provider]  furosemide (LASIX) 80 MG tablet Take 80 mg by mouth 2 (two) times daily.    [provider]  HYDROcodone-acetaminophen (NORCO/VICODIN) 5-325 MG tablet Take 1 tablet by mouth every 6 (six) hours as needed. 10/31/17   [provider]  HYDROcodone-homatropine (HYDROMET) 5-1.5 MG/5ML syrup Take 5 mLs by mouth every 6 (six) hours as needed for cough. 11/01/17   Elvina Sidle, MD  losartan (COZAAR) 25 MG tablet Take 1 tablet (25 mg total) by mouth daily. 12/15/16   Calvert Cantor, MD  nitroGLYCERIN (NITROSTAT) 0.4 MG SL tablet Place 0.4 mg under the tongue every 5 (five) minutes as needed for chest pain.    [provider]  potassium chloride SA (K-DUR,KLOR-CON) 20 MEQ tablet Take 1 tablet (20 mEq total) by mouth 2 (two) times daily. 05/31/17   Rodolph Bong, MD    Family  History Family History  Problem Relation Age of Onset  . Diabetes Mother   . Heart disease Father   . Congestive Heart Failure Sister   . Stroke Brother   . Heart disease Brother   . Diabetes Brother     Social History Social History   Tobacco Use  . Smoking status: Former Smoker    Last attempt to quit: 2000    Years since quitting: 18.9  . Smokeless tobacco: Never Used  Substance Use Topics  . Alcohol use: No  . Drug use: No     Allergies   Niacin and related; Xarelto [rivaroxaban]; and Penicillins   Review of Systems Review of Systems   Physical Exam Triage Vital Signs ED Triage Vitals [11/10/17 1145]  Enc Vitals Group     BP 120/87     Pulse Rate (!) 122     Resp 20  Temp 99.2 F (37.3 C)     Temp Source Oral     SpO2 95 %     Weight      Height      Head Circumference      Peak Flow      Pain Score      Pain Loc      Pain Edu?      Excl. in GC?    No data found.  Updated Vital Signs BP 120/87 (BP Location: Right Arm)   Pulse (!) 122 Comment: reported HR to nurse Jessica Branch  Temp 99.2 F (37.3 C) (Oral)   Resp 20   SpO2 95%   Visual Acuity Right Eye Distance:   Left Eye Distance:   Bilateral Distance:    Right Eye Near:   Left Eye Near:    Bilateral Near:     Physical Exam  Constitutional: He is oriented to person, place, and time. He appears well-developed and well-nourished.  Cardiovascular: Normal heart sounds. An irregular rhythm present. Exam reveals no decreased pulses.  HR range from 70's- 120 on monitor; noted to be irregular on auscultation and pulse palpation  Pulmonary/Chest: Effort normal and breath sounds normal. No respiratory distress. He has no wheezes.  Neurological: He is alert and oriented to person, place, and time.  Skin: Skin is warm and dry.     Scabbing wound with mild surrounding tissue redness; drainage noted to patient's shirt, but no drainage from wound on exam or with palpation; without palpable  underlying abscess, flat scab; see photo   Vitals reviewed.      UC Treatments / Results  Labs (all labs ordered are listed, but only abnormal results are displayed) Labs Reviewed - No data to display  EKG  EKG Interpretation None       Radiology No results found.  Procedures Procedures (including critical care time)  Medications Ordered in UC Medications - No data to display   Initial Impression / Assessment and Plan / UC Course  I have reviewed the triage vital signs and the nursing notes.  Pertinent labs & imaging results that were available during my care of the patient were reviewed by me and considered in my medical decision making (see chart for details).  Clinical Course as of Nov 10 1233  Fri Nov 10, 2017  1223 HR range 77-120; primarily in 80's during exam  [NB]    Clinical Course User Index [NB] Linus MakoBurky, Munira Polson B, NP    Penicillin allergy. Per pharmacist to patient doxy interferes with amiodorone. Per micromedex no interaction with clindamycin, will try course at this time so patient can restart amiodorone. Follow up with PCP next week for recheck. Patient verbalized understanding and agreeable to plan.    Final Clinical Impressions(s) / UC Diagnoses   Final diagnoses:  Cellulitis of back except buttock    ED Discharge Orders        Ordered    clindamycin (CLEOCIN) 150 MG capsule  3 times daily     11/10/17 1227       Controlled Substance Prescriptions [place] Controlled Substance Registry consulted? Not Applicable   Georgetta HaberBurky, Lempi Edwin B, NP 11/10/17 1239

## 2017-11-10 NOTE — ED Triage Notes (Signed)
Pt here for abscess to back with some drainage; pt sts hx of recent URI also

## 2017-11-10 NOTE — Discharge Instructions (Signed)
Please schedule a recheck of this wound with your primary care provider for next week. We will try another course of antibiotics.  Please take your amiodarone unless pharmacist indicates not to.  If symptoms worsen or do not improve in the next week to return to be seen or to follow up with PCP.

## 2017-12-11 ENCOUNTER — Ambulatory Visit (INDEPENDENT_AMBULATORY_CARE_PROVIDER_SITE_OTHER): Payer: Medicare Other | Admitting: Internal Medicine

## 2017-12-11 ENCOUNTER — Encounter: Payer: Self-pay | Admitting: Internal Medicine

## 2017-12-11 VITALS — BP 160/88 | HR 94 | Temp 98.8°F | Ht 70.0 in | Wt 336.6 lb

## 2017-12-11 DIAGNOSIS — I5022 Chronic systolic (congestive) heart failure: Secondary | ICD-10-CM | POA: Diagnosis not present

## 2017-12-11 DIAGNOSIS — I480 Paroxysmal atrial fibrillation: Secondary | ICD-10-CM | POA: Diagnosis not present

## 2017-12-11 DIAGNOSIS — I10 Essential (primary) hypertension: Secondary | ICD-10-CM

## 2017-12-11 NOTE — Progress Notes (Signed)
Subjective:    Patient ID: Charles Mckinney, male    DOB: 05-Aug-1951, 67 y.o.   MRN: [id number]  HPI 67 year old patient who has a history of essential hypertension as well as PAF.  He remains on chronic anticoagulation.  He was seen at the urgent care recently for a recurrent infected sebaceous cyst involving the left mid back area.  Blood pressure and pulse elevated at that time and it was suggested that he have medical follow-up.  He generally feels well.  He was treated with antibiotic therapy for the recurrent infected sebaceous cyst.  This has improved.  Past Medical History:  Diagnosis Date  . Anxiety   . CHF (congestive heart failure) (HCC)   . Dysrhythmia    ATRIAL FIBRILATION, resolved with cardioversion  . GERD (gastroesophageal reflux disease)   . Hyperlipidemia   . Hypertension   . Hypertensive heart disease   . Kidney stones   . Obesity (BMI 30-39.9)   . Osteoarthritis   . PONV (postoperative nausea and vomiting)   . Shortness of breath dyspnea      Social History   Socioeconomic History  . Marital status: Widowed    Spouse name: Not on file  . Number of children: Not on file  . Years of education: Not on file  . Highest education level: Not on file  Social Needs  . Financial resource strain: Not on file  . Food insecurity - worry: Not on file  . Food insecurity - inability: Not on file  . Transportation needs - medical: Not on file  . Transportation needs - non-medical: Not on file  Occupational History  . Occupation: disabled  Tobacco Use  . Smoking status: Former Smoker    Last attempt to quit: 2000    Years since quitting: 19.0  . Smokeless tobacco: Never Used  Substance and Sexual Activity  . Alcohol use: No  . Drug use: No  . Sexual activity: Yes  Other Topics Concern  . Not on file  Social History Narrative  . Not on file    Past Surgical History:  Procedure Laterality Date  . CARDIAC CATHETERIZATION N/A 04/09/2015   Procedure: Right/Left  Heart Cath and Coronary Angiography;  Surgeon: Orpah Cobb, MD;  [facility];  Service: Cardiovascular;  Laterality: N/A;  . CARDIOVERSION N/A 01/19/2015   Procedure: CARDIOVERSION;  Surgeon: Othella Boyer, MD;  Location: [facility] ENDOSCOPY;  Service: Cardiovascular;  Laterality: N/A;  pt in Afib, 12:22 synched cardioversion @  120 joules using Propofol 100 mg,IV....unsuccessful, repeated at 200 joules  . CARDIOVERSION N/A 04/02/2015   Procedure: CARDIOVERSION;  Surgeon: Orpah Cobb, MD;  Location: [facility] ENDOSCOPY;  Service: Cardiovascular;  Laterality: N/A;  . CATARACT EXTRACTION    . CLIPPING OF ATRIAL APPENDAGE  04/13/2015   Procedure: CLIPPING OF ATRIAL APPENDAGE;  Surgeon: Alleen Borne, MD;  Location: [facility];  Service: Open Heart Surgery;;  . CORONARY ARTERY BYPASS GRAFT N/A 04/13/2015   Procedure: CORONARY ARTERY BYPASS GRAFTING (CABG);  Surgeon: Alleen Borne, MD;  Location[facility] OR;  Service: Open Heart Surgery;  Laterality: N/A;  . TEE WITHOUT CARDIOVERSION N/A 04/13/2015   Procedure: TRANSESOPHAGEAL ECHOCARDIOGRAM (TEE);  Surgeon: Alleen Borne, MD;  Location: [facility] OR;  Service: Open Heart Surgery;  Laterality: N/A;    Family History  Problem Relation Age of Onset  . Diabetes Mother   . Heart disease Father   . Congestive Heart Failure Sister   . Stroke Brother   .  Heart disease Brother   . Diabetes Brother     Allergies  Allergen Reactions  . Niacin And Related Other (See Comments)    FLUSHING  . Xarelto [Rivaroxaban] Other (See Comments)    Cause bloody stools  . Penicillins Itching, Rash and Other (See Comments)    Has patient had a PCN reaction causing immediate rash, facial/tongue/throat swelling, SOB or lightheadedness with hypotension: Yes Has patient had a PCN reaction causing severe rash involving mucus membranes or skin necrosis: No Has patient had a PCN reaction that required hospitalization: No Has patient had a PCN reaction occurring within the last  10 years: No If all of the above answers are "NO", then may proceed with Cephalosporin use.     Current Outpatient Medications on File Prior to Visit  Medication Sig Dispense Refill  . albuterol (PROAIR HFA) 108 (90 Base) MCG/ACT inhaler INHALE 1 PUFF INTO THE LUNGS EVERY 6 HOURS AS NEEDED FOR WHEEZING OR SHORTNESS OF BREATH 8.5 g 3  . allopurinol (ZYLOPRIM) 100 MG tablet Take 2 tablets (200 mg total) by mouth daily. 180 tablet 3  . amiodarone (PACERONE) 200 MG tablet TAKE 1 TABLET(200 MG) BY MOUTH DAILY 90 tablet 0  . apixaban (ELIQUIS) 2.5 MG TABS tablet Take 2.5 mg by mouth 2 (two) times daily.     Marland Kitchen atorvastatin (LIPITOR) 40 MG tablet Take 1 tablet (40 mg total) by mouth daily. 90 tablet 3  . benzonatate (TESSALON) 100 MG capsule Take 1 capsule by mouth as needed.  0  . carvedilol (COREG) 6.25 MG tablet TAKE 1 TABLET(6.25 MG) BY MOUTH TWICE DAILY WITH A MEAL 180 tablet 0  . Cholecalciferol (VITAMIN D PO) Take 1 tablet by mouth daily.    . colchicine 0.6 MG tablet TAKE 1 TABLET(0.6 MG) BY MOUTH TWICE DAILY 180 tablet 0  . doxycycline (VIBRA-TABS) 100 MG tablet Take 1 tablet (100 mg total) by mouth 2 (two) times daily. 14 tablet 0  . fluticasone (FLONASE) 50 MCG/ACT nasal spray Place 1 spray into both nostrils daily.    . furosemide (LASIX) 80 MG tablet Take 80 mg by mouth 2 (two) times daily.    Marland Kitchen HYDROcodone-acetaminophen (NORCO/VICODIN) 5-325 MG tablet Take 1 tablet by mouth every 6 (six) hours as needed.  0  . HYDROcodone-homatropine (HYDROMET) 5-1.5 MG/5ML syrup Take 5 mLs by mouth every 6 (six) hours as needed for cough. 60 mL 0  . losartan (COZAAR) 25 MG tablet Take 1 tablet (25 mg total) by mouth daily. 30 tablet 0  . nitroGLYCERIN (NITROSTAT) 0.4 MG SL tablet Place 0.4 mg under the tongue every 5 (five) minutes as needed for chest pain.    . potassium chloride SA (K-DUR,KLOR-CON) 20 MEQ tablet Take 1 tablet (20 mEq total) by mouth 2 (two) times daily.     No current  facility-administered medications on file prior to visit.     BP (!) 160/88 (BP Location: Left Arm, Patient Position: Sitting, Cuff Size: Normal)   Pulse 94   Temp 98.8 F (37.1 C) (Oral)   Ht 5\' 10"  (1.778 m)   Wt (!) 336 lb 9.6 oz (152.7 kg)   SpO2 95%   BMI 48.30 kg/m      Review of Systems  Constitutional: Negative for appetite change, chills, fatigue and fever.  HENT: Negative for congestion, dental problem, ear pain, hearing loss, sore throat, tinnitus, trouble swallowing and voice change.   Eyes: Negative for pain, discharge and visual disturbance.  Respiratory: Negative for cough,  chest tightness, wheezing and stridor.   Cardiovascular: Negative for chest pain, palpitations and leg swelling.  Gastrointestinal: Negative for abdominal distention, abdominal pain, blood in stool, constipation, diarrhea, nausea and vomiting.  Genitourinary: Negative for difficulty urinating, discharge, flank pain, genital sores, hematuria and urgency.  Musculoskeletal: Positive for arthralgias, back pain and gait problem. Negative for joint swelling, myalgias and neck stiffness.  Skin: Positive for wound. Negative for rash.  Neurological: Negative for dizziness, syncope, speech difficulty, weakness, numbness and headaches.  Hematological: Negative for adenopathy. Does not bruise/bleed easily.  Psychiatric/Behavioral: Negative for behavioral problems and dysphoric mood. The patient is not nervous/anxious.        Objective:   Physical Exam  Constitutional: He is oriented to person, place, and time. He appears well-developed.  Repeat blood pressure 120/82 Irregular pulse with a rate of approximately 85  HENT:  Head: Normocephalic.  Right Ear: External ear normal.  Left Ear: External ear normal.  Eyes: Conjunctivae and EOM are normal.  Neck: Normal range of motion.  Cardiovascular: Normal rate and normal heart sounds.  Irregular rhythm with a pulse between 80 and 85  Pulmonary/Chest:  Breath sounds normal. No respiratory distress. He has no wheezes. He has no rales.  Abdominal: Bowel sounds are normal.  Musculoskeletal: Normal range of motion. He exhibits no edema or tenderness.  Neurological: He is alert and oriented to person, place, and time.  Psychiatric: He has a normal mood and affect. His behavior is normal.          Assessment & Plan:   Essential hypertension.  Repeat blood pressure normalized Paroxysmal atrial fibrillation.  Follow-up cardiology.  Patient states that he wishes to pursue cardiology follow-up at the [facility] Obesity weight loss encouraged Chronic anticoagulation History of recurrent infected sebaceous cyst left mid back area.  Presently stable  No change in medical regimen  Rogelia Boga

## 2017-12-11 NOTE — Patient Instructions (Signed)
Limit your sodium (Salt) intake  Cardiology follow-up as scheduled  Please check your blood pressure on a regular basis.  If it is consistently greater than 150/90, please make an office appointment.  Return in 3 months for follow-up   

## 2017-12-13 DIAGNOSIS — H353211 Exudative age-related macular degeneration, right eye, with active choroidal neovascularization: Secondary | ICD-10-CM | POA: Diagnosis not present

## 2017-12-13 DIAGNOSIS — H353221 Exudative age-related macular degeneration, left eye, with active choroidal neovascularization: Secondary | ICD-10-CM | POA: Diagnosis not present

## 2017-12-16 ENCOUNTER — Telehealth: Payer: Self-pay | Admitting: Internal Medicine

## 2017-12-18 ENCOUNTER — Telehealth: Payer: Self-pay | Admitting: Family Medicine

## 2017-12-18 NOTE — Telephone Encounter (Signed)
Copied from CRM [id number]. Topic: Inquiry >> Dec 18, 2017 10:51 AM Windy Kalata, NT wrote: Reason for CRM: patient states his cardiologist is a $60 copay and he is requesting to be referred to someone in the [place].

## 2017-12-20 NOTE — Telephone Encounter (Addendum)
°  Relation to pt:  Self  Call back number: [phone] Pharmacy: [id number] - [place], [place] - [address] [phone] (Phone) [phone] (Fax)     Reason for call:  Pharmacy denies receiving amiodarone (PACERONE) 200 MG tablet -( chart reflects Prescribing Status: Transmission to pharmacy failed (12/18/2017 10:03 AM EST) ), patient states been without since Saturday 12/16/17, please advise if Rx can be sent in today.

## 2017-12-21 ENCOUNTER — Other Ambulatory Visit: Payer: Self-pay | Admitting: Internal Medicine

## 2017-12-21 DIAGNOSIS — I509 Heart failure, unspecified: Secondary | ICD-10-CM

## 2017-12-21 MED ORDER — AMIODARONE HCL 200 MG PO TABS: ORAL_TABLET | ORAL | 2 refills | Status: DC

## 2017-12-21 NOTE — Telephone Encounter (Signed)
Medication was e-scribed. Pt was called and made aware.

## 2017-12-21 NOTE — Telephone Encounter (Signed)
Referral to cardio order placed.

## 2017-12-25 ENCOUNTER — Telehealth: Payer: Self-pay | Admitting: Internal Medicine

## 2017-12-25 NOTE — Telephone Encounter (Signed)
Copied from CRM [id number]. Topic: Quick Communication - See Telephone Encounter >> Dec 25, 2017  3:53 PM Landry Mellow wrote: CRM for notification. See Telephone encounter for:  12/25/17. [facility] called - they want the pcp to be aware that the hospital prescribed digoxin tablets and Dr Kirtland Bouchard recently prescribed kadakia. This flagged for the pharmacist  and they would like to make sure this is ok. Cb# [phone]

## 2017-12-25 NOTE — Telephone Encounter (Signed)
I do not know what Charles Mckinney is??

## 2017-12-26 NOTE — Telephone Encounter (Signed)
Okay for patient to use digoxin

## 2017-12-26 NOTE — Telephone Encounter (Signed)
[facility] pharmacy, was on hold >47mins.  WCB

## 2017-12-27 NOTE — Telephone Encounter (Signed)
Per pharmacy they were calling to ask if pt should be on amiodarone AND digoxin. Rx for amiodarone was sent 12/21/17 so they need clarification please.  Dr. Kirtland Bouchard - Please advise. Thanks!

## 2017-12-28 NOTE — Telephone Encounter (Signed)
Hold digoxin Please schedule cardiology follow-up

## 2017-12-29 NOTE — Telephone Encounter (Signed)
Pharmacy notified to HOLD rx. Pt has Cards consult 01/2018.

## 2018-01-01 ENCOUNTER — Other Ambulatory Visit: Payer: Self-pay | Admitting: Internal Medicine

## 2018-01-01 DIAGNOSIS — I5022 Chronic systolic (congestive) heart failure: Secondary | ICD-10-CM

## 2018-01-02 ENCOUNTER — Telehealth: Payer: Self-pay | Admitting: Internal Medicine

## 2018-01-02 NOTE — Telephone Encounter (Signed)
Copied from CRM [id number]. Topic: General - Other >> Jan 02, 2018 12:04 PM Cecelia Byars, RMA wrote: Reason for CRM: Medication refill request for potassium 20 meq to [facility]

## 2018-01-02 NOTE — Telephone Encounter (Signed)
Left message on pt. Voicemail that Dr. Ramiro Harvest refilled his Potassium last, and to contact him for refill.

## 2018-01-03 ENCOUNTER — Other Ambulatory Visit: Payer: Self-pay | Admitting: Internal Medicine

## 2018-01-03 DIAGNOSIS — I5022 Chronic systolic (congestive) heart failure: Secondary | ICD-10-CM

## 2018-01-03 MED ORDER — POTASSIUM CHLORIDE CRYS ER 20 MEQ PO TBCR: 20.0000 meq | EXTENDED_RELEASE_TABLET | Freq: Two times a day (BID) | ORAL | 2 refills | Status: DC

## 2018-01-03 MED ORDER — APIXABAN 2.5 MG PO TABS: 2.5000 mg | ORAL_TABLET | Freq: Two times a day (BID) | ORAL | 3 refills | Status: DC

## 2018-01-03 MED ORDER — ATORVASTATIN CALCIUM 40 MG PO TABS: 40.0000 mg | ORAL_TABLET | Freq: Every day | ORAL | 3 refills | Status: DC

## 2018-01-03 NOTE — Telephone Encounter (Signed)
Medications were e-scribed to the pharmacy.

## 2018-01-03 NOTE — Telephone Encounter (Signed)
Most of pt's recent rx's seem to have been filled by hospitalists or ED providers.  Pt is asking for potassium, lipitor and eliquis.  Dr. Kirtland Bouchard is not in office. Dr. Tawanna Cooler - Please advise. Thanks!

## 2018-01-03 NOTE — Telephone Encounter (Signed)
Please call patient and give him the appropriate refills of his medication

## 2018-01-03 NOTE — Telephone Encounter (Signed)
Patient states he does not know who Dr Janee Morn is. Please advise. Call back [phone]

## 2018-01-03 NOTE — Telephone Encounter (Signed)
Patient stating also needs refill of lipitor and eliquis

## 2018-01-17 DIAGNOSIS — H353221 Exudative age-related macular degeneration, left eye, with active choroidal neovascularization: Secondary | ICD-10-CM | POA: Diagnosis not present

## 2018-01-23 ENCOUNTER — Ambulatory Visit: Payer: Self-pay | Admitting: *Deleted

## 2018-01-23 ENCOUNTER — Ambulatory Visit (INDEPENDENT_AMBULATORY_CARE_PROVIDER_SITE_OTHER): Payer: Medicare Other | Admitting: Adult Health

## 2018-01-23 ENCOUNTER — Encounter: Payer: Self-pay | Admitting: Adult Health

## 2018-01-23 VITALS — BP 124/70 | HR 78 | Temp 97.8°F | Wt 336.0 lb

## 2018-01-23 DIAGNOSIS — R0602 Shortness of breath: Secondary | ICD-10-CM

## 2018-01-23 LAB — BASIC METABOLIC PANEL
BUN: 22 mg/dL (ref 6–23)
CALCIUM: 9.6 mg/dL (ref 8.4–10.5)
CO2: 31 meq/L (ref 19–32)
Chloride: 103 mEq/L (ref 96–112)
Creatinine, Ser: 1.19 mg/dL (ref 0.40–1.50)
GFR: 64.72 mL/min (ref >60.00)
Glucose, Bld: 145 mg/dL — ABNORMAL HIGH (ref 70–99)
POTASSIUM: 3.9 meq/L (ref 3.5–5.1)
SODIUM: 143 meq/L (ref 135–145)

## 2018-01-23 LAB — POCT URINALYSIS DIPSTICK
BILIRUBIN UA: NEGATIVE
Glucose, UA: NEGATIVE
KETONES UA: NEGATIVE
Leukocytes, UA: NEGATIVE
Nitrite, UA: NEGATIVE
Odor: NEGATIVE
PH UA: 5.5 (ref 5.0–8.0)
PROTEIN UA: NEGATIVE
Spec Grav, UA: 1.02 (ref 1.010–1.025)
UROBILINOGEN UA: 0.2 U/dL

## 2018-01-23 LAB — BRAIN NATRIURETIC PEPTIDE: Pro B Natriuretic peptide (BNP): 411 pg/mL — ABNORMAL HIGH (ref 0.0–100.0)

## 2018-01-23 NOTE — Telephone Encounter (Signed)
Noted  

## 2018-01-23 NOTE — Progress Notes (Signed)
Subjective:    Patient ID: Charles Mckinney, male    DOB: 1951-04-14, 67 y.o.   MRN: [id number]  HPI 67 year old male who  has a past medical history of Anxiety, CHF (congestive heart failure) (HCC), Dysrhythmia, GERD (gastroesophageal reflux disease), Hyperlipidemia, Hypertension, Hypertensive heart disease, Kidney stones, Obesity (BMI 30-39.9), Osteoarthritis, PONV (postoperative nausea and vomiting), and Shortness of breath dyspnea. He is a patient of Dr. Kirtland Bouchard who I am seeing today for the acute on chronic complaint of SOB with dry cough. He reports that his symptoms started Sunday and have not improved since. His SOB is with exertion. He does endorse wheezing at rest and has dry cough that started yesterday.   He has been using his inhaler which reports works.   He has been taking Lasix 80 mg daily, he does not take it at night as he cannot sleep at night due to having constant urination.   He does not do daily weights   Wt Readings from Last 3 Encounters:  01/23/18 (!) 336 lb (152.4 kg)  12/11/17 (!) 336 lb 9.6 oz (152.7 kg)  11/07/17 (!) 334 lb 3.2 oz (151.6 kg)    Review of Systems  Constitutional: Negative.   Respiratory: Positive for cough, shortness of breath and wheezing. Negative for chest tightness.   Cardiovascular: Negative.   All other systems reviewed and are negative.  See HPI   Past Medical History:  Diagnosis Date  . Anxiety   . CHF (congestive heart failure) (HCC)   . Dysrhythmia    ATRIAL FIBRILATION, resolved with cardioversion  . GERD (gastroesophageal reflux disease)   . Hyperlipidemia   . Hypertension   . Hypertensive heart disease   . Kidney stones   . Obesity (BMI 30-39.9)   . Osteoarthritis   . PONV (postoperative nausea and vomiting)   . Shortness of breath dyspnea     Social History   Socioeconomic History  . Marital status: Widowed    Spouse name: Not on file  . Number of children: Not on file  . Years of education: Not on file  .  Highest education level: Not on file  Social Needs  . Financial resource strain: Not on file  . Food insecurity - worry: Not on file  . Food insecurity - inability: Not on file  . Transportation needs - medical: Not on file  . Transportation needs - non-medical: Not on file  Occupational History  . Occupation: disabled  Tobacco Use  . Smoking status: Former Smoker    Last attempt to quit: 2000    Years since quitting: 19.1  . Smokeless tobacco: Never Used  Substance and Sexual Activity  . Alcohol use: No  . Drug use: No  . Sexual activity: Yes  Other Topics Concern  . Not on file  Social History Narrative  . Not on file    Past Surgical History:  Procedure Laterality Date  . CARDIAC CATHETERIZATION N/A 04/09/2015   Procedure: Right/Left Heart Cath and Coronary Angiography;  Surgeon: Orpah Cobb, MD;  Location: [facility] INVASIVE CV [facility] CUPID;  Service: Cardiovascular;  Laterality: N/A;  . CARDIOVERSION N/A 01/19/2015   Procedure: CARDIOVERSION;  Surgeon: Othella Boyer, MD;  Location: [facility] ENDOSCOPY;  Service: Cardiovascular;  Laterality: N/A;  pt in Afib, 12:22 synched cardioversion @  120 joules using Propofol 100 mg,IV....unsuccessful, repeated at 200 joules  . CARDIOVERSION N/A 04/02/2015   Procedure: CARDIOVERSION;  Surgeon: Orpah Cobb, MD;  Location: [facility] ENDOSCOPY;  Service: Cardiovascular;  Laterality: N/A;  . CATARACT EXTRACTION    . CLIPPING OF ATRIAL APPENDAGE  04/13/2015   Procedure: CLIPPING OF ATRIAL APPENDAGE;  Surgeon: Alleen Borne, MD;  Location: [facility] OR;  Service: Open Heart Surgery;;  . CORONARY ARTERY BYPASS GRAFT N/A 04/13/2015   Procedure: CORONARY ARTERY BYPASS GRAFTING (CABG);  Surgeon: Alleen Borne, MD;  Location: [facility] OR;  Service: Open Heart Surgery;  Laterality: N/A;  . TEE WITHOUT CARDIOVERSION N/A 04/13/2015   Procedure: TRANSESOPHAGEAL ECHOCARDIOGRAM (TEE);  Surgeon: Alleen Borne, MD;  Location: [facility] OR;  Service: Open Heart Surgery;  Laterality: N/A;    Family  History  Problem Relation Age of Onset  . Diabetes Mother   . Heart disease Father   . Congestive Heart Failure Sister   . Stroke Brother   . Heart disease Brother   . Diabetes Brother     Allergies  Allergen Reactions  . Niacin And Related Other (See Comments)    FLUSHING  . Xarelto [Rivaroxaban] Other (See Comments)    Cause bloody stools  . Penicillins Itching, Rash and Other (See Comments)    Has patient had a PCN reaction causing immediate rash, facial/tongue/throat swelling, SOB or lightheadedness with hypotension: Yes Has patient had a PCN reaction causing severe rash involving mucus membranes or skin necrosis: No Has patient had a PCN reaction that required hospitalization: No Has patient had a PCN reaction occurring within the last 10 years: No If all of the above answers are "NO", then may proceed with Cephalosporin use.     Current Outpatient Medications on File Prior to Visit  Medication Sig Dispense Refill  . albuterol (PROAIR HFA) 108 (90 Base) MCG/ACT inhaler INHALE 1 PUFF INTO THE LUNGS EVERY 6 HOURS AS NEEDED FOR WHEEZING OR SHORTNESS OF BREATH 8.5 g 3  . allopurinol (ZYLOPRIM) 100 MG tablet Take 2 tablets (200 mg total) by mouth daily. 180 tablet 3  . amiodarone (PACERONE) 200 MG tablet TAKE 1 TABLET(200 MG) BY MOUTH DAILY 90 tablet 2  . apixaban (ELIQUIS) 2.5 MG TABS tablet Take 1 tablet (2.5 mg total) by mouth 2 (two) times daily. 60 tablet 3  . atorvastatin (LIPITOR) 40 MG tablet Take 1 tablet (40 mg total) by mouth daily. 90 tablet 3  . carvedilol (COREG) 6.25 MG tablet TAKE 1 TABLET(6.25 MG) BY MOUTH TWICE DAILY WITH A MEAL 180 tablet 0  . Cholecalciferol (VITAMIN D PO) Take 1 tablet by mouth daily.    . colchicine 0.6 MG tablet TAKE 1 TABLET(0.6 MG) BY MOUTH TWICE DAILY 180 tablet 0  . fluticasone (FLONASE) 50 MCG/ACT nasal spray Place 1 spray into both nostrils daily.    . furosemide (LASIX) 80 MG tablet Take 80 mg by mouth 2 (two) times daily.    Marland Kitchen  lisinopril (PRINIVIL,ZESTRIL) 2.5 MG tablet TAKE 1 TABLET(2.5 MG) BY MOUTH DAILY 90 tablet 0  . losartan (COZAAR) 25 MG tablet Take 1 tablet (25 mg total) by mouth daily. 30 tablet 0  . nitroGLYCERIN (NITROSTAT) 0.4 MG SL tablet Place 0.4 mg under the tongue every 5 (five) minutes as needed for chest pain.    . potassium chloride SA (K-DUR,KLOR-CON) 20 MEQ tablet Take 1 tablet (20 mEq total) by mouth 2 (two) times daily. 180 tablet 2  . DIGOX 125 MCG tablet TK 1 T PO QD  1  . HYDROcodone-acetaminophen (NORCO/VICODIN) 5-325 MG tablet Take 1 tablet by mouth every 6 (six) hours as needed.  0   No current facility-administered medications  on file prior to visit.     BP 124/70 (BP Location: Left Arm)   Pulse 78   Temp 97.8 F (36.6 C) (Oral)   Wt (!) 336 lb (152.4 kg)   SpO2 97%   BMI 48.21 kg/m       Objective:   Physical Exam  Constitutional: He is oriented to person, place, and time. He appears well-developed and well-nourished. No distress.  Cardiovascular: Normal rate and intact distal pulses. An irregularly irregular rhythm present. Exam reveals distant heart sounds.  Pulmonary/Chest: Effort normal. No respiratory distress. He has no decreased breath sounds. He has no wheezes. He has no rhonchi. He has rales (trace rales in lower lung fields ). He exhibits no tenderness.  Musculoskeletal: He exhibits edema (bilateral non pitting ).  Neurological: He is alert and oriented to person, place, and time.  Skin: Skin is warm and dry. No rash noted. He is not diaphoretic. No erythema. No pallor.  Psychiatric: He has a normal mood and affect. His behavior is normal. Judgment and thought content normal.  Vitals reviewed.     Assessment & Plan:  1. SOB (shortness of breath) - Concern for fluid overload. Advised to take lasix at night as well  - Daily weights and follow up if weight is greater than 5 pounds in two days - Consider increasing lasix for one day  - Follow up with PCP as  needed - DG Chest 2 View; Future - Basic Metabolic Panel - Brain Natriuretic Peptide - POC Urinalysis Dipstick   Shirline Frees, NP

## 2018-01-23 NOTE — Telephone Encounter (Signed)
Pt called because his breathing has increasing gotten worst since waking up this morning. He can only take a few steps before his breathing gets really difficult.  He also has a "slight" cough.  No fever. Has some wheezing at times. Appointment made per protocol. Home care advice given to patient with verbal understanding.   Reason for Disposition . [1] MODERATE longstanding difficulty breathing (e.g., speaks in phrases, SOB even at rest, pulse 100-120) AND [2] SAME as normal  Answer Assessment - Initial Assessment Questions 1. RESPIRATORY STATUS: "Describe your breathing?" (e.g., wheezing, shortness of breath, unable to speak, severe coughing)      Shortness of breath, some wheezing and a slight cough 2. ONSET: "When did this breathing problem begin?"      sunday 3. PATTERN "Does the difficult breathing come and go, or has it been constant since it started?"      Just when he woke up this morning 4. SEVERITY: "How bad is your breathing?" (e.g., mild, moderate, severe)    - MILD: No SOB at rest, mild SOB with walking, speaks normally in sentences, can lay down, no retractions, pulse < 100.    - MODERATE: SOB at rest, SOB with minimal exertion and prefers to sit, cannot lie down flat, speaks in phrases, mild retractions, audible wheezing, pulse 100-120.    - SEVERE: Very SOB at rest, speaks in single words, struggling to breathe, sitting hunched forward, retractions, pulse > 120      moderate 5. RECURRENT SYMPTOM: "Have you had difficulty breathing before?" If so, ask: "When was the last time?" and "What happened that time?"      Yes ,no sure 6. CARDIAC HISTORY: "Do you have any history of heart disease?" (e.g., heart attack, angina, bypass surgery, angioplasty)      Open heart surgery 7. LUNG HISTORY: "Do you have any history of lung disease?"  (e.g., pulmonary embolus, asthma, emphysema)    not sure, has an inhaler to use 8. CAUSE: "What do you think is causing the breathing problem?"      No sure 9. OTHER SYMPTOMS: "Do you have any other symptoms? (e.g., dizziness, runny nose, cough, chest pain, fever)     Slight cough 10. PREGNANCY: "Is there any chance you are pregnant?" "When was your last menstrual period?"        11 . TRAVEL: "Have you traveled out of the country in the last month?" (e.g., travel history, exposures)       no  Protocols used: BREATHING DIFFICULTY-A-AH

## 2018-01-23 NOTE — Progress Notes (Signed)
   Subjective:    Patient ID: Charles Mckinney, male    DOB: [birth year], 67 y.o.   MRN: [id number]  HPI  Presents with 3 history of shortness of breath and dry cough with intermittent wheezing.  He woke up this morning "gasping" for air.  He has had to stop frequently during ambulation to rest and catch his breath.  He does not weigh daily.  He has not weighed in a long time.  He denies fever, chills, exposure to anyone sick.  He is able to speak in full sentences. He does not use a CPAP for his OSA.  He says this feels similar to other episodes of fluid overload he has experienced in the past.  He has been taking his amiodarone, lisinopril, lasix and carvedilol.  He has been urinating more than usual.  He has not been taking his losartan.     Review of Systems  HENT: Positive for congestion. Negative for sinus pressure, sinus pain and sore throat.   Respiratory: Positive for cough, shortness of breath and wheezing. Negative for chest tightness.   Cardiovascular: Negative for chest pain, palpitations and leg swelling.  Gastrointestinal: Negative.       Objective:   Physical Exam  Constitutional: He appears well-developed and well-nourished. No distress.  Cardiovascular: Intact distal pulses. An irregular rhythm present. Exam reveals distant heart sounds.  Non-pitting edema to lower extremities.   Pulmonary/Chest: No accessory muscle usage. No tachypnea and no bradypnea. No respiratory distress. He has rales in the right lower field and the left lower field.  Skin: He is not diaphoretic.  Nursing note and vitals reviewed.     Assessment & Plan:  1. SOB (shortness of breath) Encouraged him to do daily weights and keep a journal. Will review lab work and CXR.   Follow-up with Dr. Amador Cunas. - DG Chest 2 View; Future - Basic Metabolic Panel - Brain Natriuretic Peptide - POC Urinalysis Dipstick

## 2018-01-24 ENCOUNTER — Ambulatory Visit (INDEPENDENT_AMBULATORY_CARE_PROVIDER_SITE_OTHER)
Admission: RE | Admit: 2018-01-24 | Discharge: 2018-01-24 | Disposition: A | Discharge status: Home / Self Care | Payer: Medicare Other | Source: Ambulatory Visit | Attending: Adult Health | Admitting: Adult Health

## 2018-01-24 DIAGNOSIS — R0602 Shortness of breath: Secondary | ICD-10-CM

## 2018-01-24 DIAGNOSIS — R05 Cough: Secondary | ICD-10-CM | POA: Diagnosis not present

## 2018-01-25 ENCOUNTER — Other Ambulatory Visit: Payer: Self-pay | Admitting: Internal Medicine

## 2018-01-25 DIAGNOSIS — I5022 Chronic systolic (congestive) heart failure: Secondary | ICD-10-CM

## 2018-01-29 ENCOUNTER — Other Ambulatory Visit: Payer: Self-pay | Admitting: Internal Medicine

## 2018-01-29 DIAGNOSIS — I5022 Chronic systolic (congestive) heart failure: Secondary | ICD-10-CM

## 2018-01-30 ENCOUNTER — Telehealth: Payer: Self-pay | Admitting: Internal Medicine

## 2018-01-30 NOTE — Telephone Encounter (Signed)
Copied from CRM [id number]. Topic: Quick Communication - Rx Refill/Question >> Jan 30, 2018  2:50 PM Caryn Section E, NT wrote: Medication: carvedilol (COREG) 6.25 MG tablet  Has the patient contacted their pharmacy? Yes    (Agent: If no, request that the patient contact the pharmacy for the refill.)   Preferred Pharmacy (with phone number or street name):[facility] - [place], [place] - [address] [phone] (Phone) [phone] (Fax)     Agent: Please be advised that RX refills may take up to 3 business days. We ask that you follow-up with your pharmacy.

## 2018-01-31 NOTE — Telephone Encounter (Signed)
Called pt and pt states that he called [facility] but could not get anyone to answer the phone and was told that they did not receive a prescription for the requested medication. Pt states he was going to use another pharmacy due to the fact he could not receive an answer from [facility].Notified pt that the nurse would reach out to the pharmacy to verify that the prescription sent on 2/25 was received and filled for the pt.   [facility] contacted to ensure that medication refill was received on 2/25. Pharmacy states that refill is available for the pt to pick up at this time.   Contacted pt and notified that the medication was available for pick up at [facility]. Pt states he will pick up the medication from [facility].

## 2018-02-01 ENCOUNTER — Ambulatory Visit (INDEPENDENT_AMBULATORY_CARE_PROVIDER_SITE_OTHER): Payer: Medicare Other | Admitting: Cardiology

## 2018-02-01 ENCOUNTER — Encounter: Payer: Self-pay | Admitting: Cardiology

## 2018-02-01 VITALS — BP 101/63 | HR 104 | Ht 70.5 in | Wt 332.5 lb

## 2018-02-01 DIAGNOSIS — I10 Essential (primary) hypertension: Secondary | ICD-10-CM

## 2018-02-01 DIAGNOSIS — I5043 Acute on chronic combined systolic (congestive) and diastolic (congestive) heart failure: Secondary | ICD-10-CM

## 2018-02-01 DIAGNOSIS — I4819 Other persistent atrial fibrillation: Secondary | ICD-10-CM

## 2018-02-01 DIAGNOSIS — E782 Mixed hyperlipidemia: Secondary | ICD-10-CM | POA: Diagnosis not present

## 2018-02-01 DIAGNOSIS — I481 Persistent atrial fibrillation: Secondary | ICD-10-CM | POA: Diagnosis not present

## 2018-02-01 DIAGNOSIS — I25119 Atherosclerotic heart disease of native coronary artery with unspecified angina pectoris: Secondary | ICD-10-CM

## 2018-02-01 DIAGNOSIS — Z951 Presence of aortocoronary bypass graft: Secondary | ICD-10-CM | POA: Diagnosis not present

## 2018-02-01 DIAGNOSIS — I255 Ischemic cardiomyopathy: Secondary | ICD-10-CM | POA: Diagnosis not present

## 2018-02-01 DIAGNOSIS — Z7901 Long term (current) use of anticoagulants: Secondary | ICD-10-CM

## 2018-02-01 NOTE — Progress Notes (Signed)
PCP: [name], MD  Clinic Note: Chief Complaint  Patient presents with  . New Patient (Initial Visit)    Swelling and shortness of breath  . Coronary Artery Disease    CABG  . Cardiomyopathy    HPI: Charles Mckinney is a 67 y.o. male with a PMH notable for ischemic cardiomyopathy-CABG (EF 30-35%) with A. fib who presents today for establishing new cardiology care.  He is seen today at the request of [name], MD. Charles Mckinney, Charles Mckinney  His cardiac disease he  is morbidly obese with OSA/obesity hypoventilation syndrome (OSH), hypertension and hyperlipidemia, along with DJD.  He has a strong family history for coronary artery disease as well.  Cardiac History:   2014: Noted to have had a Cardiolite stress test that was negative for ischemia with EF in the 50s.  (Followed by Dr. Donnie Aho)  February 2015: Admitted with mild non-STEMI and acute heart failure noted to have a EF of 30-35% with anterior hypokinesis.  Was diuresed, but he [facility] ->  Lost to follow-up until January 2016  January 2016: Admitted with A. fib RVR and heart failure in the ER --> left AMA; wanted to follow-up at the [facility]  February 2016: Admitted again with A. fib RVR and CHF.  (Apparently had not been taking Xarelto because he "was told that it was dangerous ").  Eventually underwent DCCV --> went back to following up with [facility]  March 31, 2015 (admitted by Dr. Orpah Cobb) -admitted with chest tightness and heart failure (PND and orthopnea. ->  Was again in A. fib RVR and was cardioverted.  Was on lisinopril, carvedilol, furosemide, amiodarone and Xarelto.  Readmitted Apr 05, 2015 again with Heart Failure-PND, orthopnea and edema with progressing exertional dyspnea.  Was noted to be hypoxic with oxygen saturations in the 80s requiring BiPAP and IV Lasix.  -->  Finally noted to have not had an ischemic evaluation.  Had cardiac catheterization revealing multivessel  CAD   CABG x 4: LIMA-LAD, SVG-Diag, SeqSVG-rPL-rPDA --> Intra-Op TEE showed EF of 30% with only mild MR.  EF did appear to be improved post bypass  January 2018: Admitted again for CHF -> converted from lisinopril to losartan with plans to switch to [facility].  May 2018: Admitted with TIA -was unable to afford Eliquis, placed back on Xarelto.->  Apparently Xarelto has been stopped for eye bleed by his ophthalmologist continued on amiodarone and beta-blocker for A. fib;   Head and neck CTA -no hemodynamically significant stenosis.  Left carotid bifurcation atheromatous plaque but no significant stenosis.  No significant right carotid disease.  Widely patent vertebral arteries.  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  Also moderate to severe disease in the left V4 stenosis -with multi focal basal irregularity and stenosis of 50-75%.  Moderate anterior circulation atheromatous irregularity.   Most Recent Hospitalization:   May 31, 2017: Admitted for chest pain 8 out of 10 pressure-like nonradiating associated with dyspnea.  Mild congestive heart failure noted on chest x-ray.  Myoview showed no ischemia.  Remain rate controlled on amiodarone.   Studies Personally Reviewed - (if available, images/films reviewed: From Epic Chart or Care Everywhere)  Transthoracic Echos:  February 01, 2014: EF 30-35%.  Mild LVH.  GRII DD.  Mild LA dilation.  Moderate diastolic dysfunction.  January 13, 2015: Technically difficult.  Mild LVH.  EF 35%.  Diffuse HK.  Moderate LA dilation.  Mildly dilated RV with mildly reduced function.  April 01, 2015: Mild concentric LVH.  EF 30-35%, diffuse HK.  Severe HK of mid apical inferior wall.  Moderate LA dilation.  December 14, 2016: Moderate severely reduced LV.  EF 30 of 35% with diffuse HK.  Mild MR.  Mildly dilated RA.  Apr 27, 2017: (In setting of TIA).   Technically difficult mild to moderate globally reduced LV function.  Mild LVH.  Mild MR.  EF estimated 40-45%.  Moderate LA dilation.  May 31, 2017: atrial fibrillation. Mild to moderately dilated LV with mild LV hypertrophy. EF 30-35% with diffuse hypokinesis. Mildly dilated RV with mildly decreased systolic function. Mild mitral regurgitation   Right and Left Heart Catheterization Apr 09, 2015: LM 95%; ostLAD 99% - p-dLAD 80%, D1 99% - D2 80%; OCx 90%,pCx 50%; pRCA 80%, mRCA 50%, rPDA 80% & 90%.    CABG x 4: LIMA-LAD, SVG-Diag, SeqSVG-rPL-rPDA --> Intra-Op TEE showed EF of 30% with only mild MR.  EF did appear to be improved post bypass   May 31, 2017: Myoview EF 27%.  No reversible ischemia.  Large anterior-apical and septal infarct.  Diffuse LV HK and moderate LV dilation.  [name] was last seen on December 11, 2017 by Dr. Amador Cunas --> apparently he was having a sebaceous cyst removed on his back and was noted to have high blood pressure so he went back for PCP evaluation.  He was treated with antibiotics for the cyst.  Apparently he was concerned about insurance coverage issues with his local cardiologist, and was trying to work out to being seen by the [facility] cardiology, but then is finally been referred to [facility].   Interval History:     No chest pain or shortness of breath with rest or exertion. No PND, orthopnea or edema. No palpitations, lightheadedness, dizziness, weakness or syncope/near syncope. No TIA/amaurosis fugax symptoms. No melena, hematochezia, hematuria, or epstaxis. No claudication.  ROS: A comprehensive was performed. Review of Systems  Constitutional: Positive for malaise/fatigue.  HENT: Negative for congestion, hearing loss and nosebleeds.   Respiratory: Positive for shortness of breath (At baseline but worse with exertion). Negative for cough (Off and on the morning).   Cardiovascular: Positive for chest pain (Off-and-on sharp chest pains, but  not significant) and leg swelling.       Unable to really assess PND orthopnea because OSA.  Gastrointestinal: Positive for heartburn and nausea. Negative for blood in stool and melena.  Genitourinary: Negative for hematuria.  Musculoskeletal: Positive for back pain and joint pain. Negative for myalgias.  Skin: Negative for itching and rash.  Neurological: Positive for weakness (Global weakness, not able to do what he is able to do before.). Negative for dizziness.  Psychiatric/Behavioral: Negative for depression and memory loss. The patient is not nervous/anxious and does not have insomnia.   All other systems reviewed and are negative.  I have reviewed and (if needed) personally updated the patient's problem list, medications, allergies, past medical and surgical history, social and family history.   Past Medical History:  Diagnosis Date  . Anxiety   . 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 --> CABG x 4: LIMA-LAD, SVG-Diag, SeqSVG-rPL-rPDA --> Intra-Op TEE showed EF of 30% with only mild MR.   Marland Kitchen  Chronic combined systolic and diastolic heart failure, NYHA class 3 (HCC) 01/2014   EF is been ranging from 30-35% since February 2015.  Suggestion of anterior infarct on Myoview/echo.  Marland Kitchen GERD (gastroesophageal reflux disease)   . Hyperlipidemia   . Hypertension   . Hypertensive heart disease    Mild to moderately dilated LV with mild LVH.  . Ischemic cardiomyopathy 01/2014   EF 30-35%.  Severe multivessel CAD noted on cath--CABG x4 -> Myoview (June 2018) shows large anterior-septal-apical infarct with no ischemia.  . Kidney stones   . Obesity (BMI 30-39.9)   . Osteoarthritis   . Permanent atrial fibrillation (HCC)    On Amiodarone & Beta Blocker (on Xarelto)  . PONV (postoperative nausea and vomiting)     Past Surgical History:  Procedure  Laterality Date  . CARDIAC CATHETERIZATION N/A 04/09/2015   Procedure: Right/Left Heart Cath and Coronary Angiography;  Surgeon: Orpah Cobb, MD;  Location: [facility] INVASIVE CV LAB CUPID::  LM 95%; ostLAD 99% - p-dLAD 80%, D1 99% - D2 80%; OCx 90%,pCx 50%; pRCA 80%, mRCA 50%, rPDA 80% & 90%.  . CARDIOVERSION N/A 01/19/2015   Procedure: CARDIOVERSION;  Surgeon: Othella Boyer, MD;  Location: [facility] ENDOSCOPY;  Service: Cardiovascular;  Laterality: N/A;  pt in Afib, 12:22 synched cardioversion @  120 joules using Propofol 100 mg,IV....unsuccessful, repeated at 200 joules  . CARDIOVERSION N/A 04/02/2015   Procedure: CARDIOVERSION;  Surgeon: Orpah Cobb, MD;  Location: [facility] ENDOSCOPY;  Service: Cardiovascular;  Laterality: N/A;  . CATARACT EXTRACTION    . CLIPPING OF ATRIAL APPENDAGE  04/13/2015   Procedure: CLIPPING OF ATRIAL APPENDAGE;  Surgeon: Alleen Borne, MD;  Location: [facility] OR;  Service: Open Heart Surgery;;  . CORONARY ARTERY BYPASS GRAFT N/A 04/13/2015   Procedure: CORONARY ARTERY BYPASS GRAFTING (CABG);  Surgeon: Alleen Borne, MD;  Location: [facility] OR;  Service: Open Heart Surgery;; LIMA-LAD, SVG-Diag, SeqSVG-rPL-rPDA   . NM MYOVIEW LTD  05/2017   EF 27%.  No reversible ischemia.  Large anterior-apical and septal infarct.  Diffuse LV HK and moderate LV dilation.  . TEE WITHOUT CARDIOVERSION N/A 04/13/2015   Procedure: TRANSESOPHAGEAL ECHOCARDIOGRAM (TEE);  Surgeon: Alleen Borne, MD;  Location: [facility] OR;  Service: Open Heart Surgery;  Laterality: N/A;  . TRANSTHORACIC ECHOCARDIOGRAM  2/'15, 2/'16, 4/'16   a) EF 30-35%.  Mild LVH.  GRII DD.  Mild LA dilation.  Moderate diastolic dysfunction. b) Technically difficult.  Mild LVH.  EF 35%.  Diffuse HK.  Moderate LA dilation.  Mildly dilated RV with mildly reduced function.  c)   Mild concentric LVH.  EF 30-35%, diffuse HK.  Severe HK of mid apical inferior wall.  Moderate LA dilation.  . TRANSTHORACIC ECHOCARDIOGRAM N/A 1/18, 5/18, 7/18   a) Moderate severely  reduced LV.  EF 30 of 35% with diffuse HK.  Mild MR.  Mildly dilated RA.;; b) (In setting of TIA).  Technically difficult mild to moderate globally reduced LV function.  Mild LVH.  Mild MR.  EF estimated 40-45%.  Moderate LA dilation.;; c)  Afib. Mild to mod dilated LV with mild LVH. EF 30-35% with diffuse HK. Mildly dilated RV with mildly decrease Systolic Fxn. Mild MR    Current Meds  Medication Sig  . albuterol (PROAIR HFA) 108 (90 Base) MCG/ACT inhaler INHALE 1 PUFF INTO THE LUNGS EVERY 6 HOURS AS NEEDED FOR WHEEZING OR SHORTNESS OF BREATH  . allopurinol (ZYLOPRIM) 100 MG tablet Take 2 tablets (200 mg total) by mouth  daily.  . amiodarone (PACERONE) 200 MG tablet TAKE 1 TABLET(200 MG) BY MOUTH DAILY  . apixaban (ELIQUIS) 2.5 MG TABS tablet Take 1 tablet (2.5 mg total) by mouth 2 (two) times daily.  Marland Kitchen atorvastatin (LIPITOR) 40 MG tablet Take 1 tablet (40 mg total) by mouth daily.  . carvedilol (COREG) 6.25 MG tablet TAKE 1 TABLET(6.25 MG) BY MOUTH TWICE DAILY WITH A MEAL  . Cholecalciferol (VITAMIN D PO) Take 1 tablet by mouth daily.  . colchicine 0.6 MG tablet TAKE 1 TABLET(0.6 MG) BY MOUTH TWICE DAILY  . DIGOX 125 MCG tablet TK 1 T PO QD  . fluticasone (FLONASE) 50 MCG/ACT nasal spray Place 1 spray into both nostrils daily.  . furosemide (LASIX) 80 MG tablet Take 80 mg by mouth 2 (two) times daily.  Marland Kitchen HYDROcodone-acetaminophen (NORCO/VICODIN) 5-325 MG tablet Take 1 tablet by mouth every 6 (six) hours as needed.  Marland Kitchen lisinopril (PRINIVIL,ZESTRIL) 2.5 MG tablet TAKE 1 TABLET(2.5 MG) BY MOUTH DAILY  . losartan (COZAAR) 25 MG tablet Take 1 tablet (25 mg total) by mouth daily.  . nitroGLYCERIN (NITROSTAT) 0.4 MG SL tablet Place 0.4 mg under the tongue every 5 (five) minutes as needed for chest pain.  . potassium chloride SA (K-DUR,KLOR-CON) 20 MEQ tablet Take 1 tablet (20 mEq total) by mouth 2 (two) times daily.  Not taking lisinopril.  Allergies  Allergen Reactions  . Niacin And Related Other  (See Comments)    FLUSHING  . Xarelto [Rivaroxaban] Other (See Comments)    Cause bloody stools  . Penicillins Itching, Rash and Other (See Comments)    Has patient had a PCN reaction causing immediate rash, facial/tongue/throat swelling, SOB or lightheadedness with hypotension: Yes Has patient had a PCN reaction causing severe rash involving mucus membranes or skin necrosis: No Has patient had a PCN reaction that required hospitalization: No Has patient had a PCN reaction occurring within the last 10 years: No If all of the above answers are "NO", then may proceed with Cephalosporin use.     Social History   Tobacco Use  . Smoking status: Former Smoker    Last attempt to quit: 2000    Years since quitting: 19.1  . Smokeless tobacco: Never Used  Substance Use Topics  . Alcohol use: No  . Drug use: No   Social History   Social History Narrative  . Not on file    family history includes Congestive Heart Failure in his sister; Diabetes in his brother and mother; Heart disease in his brother and father; Stroke in his brother.  Wt Readings from Last 3 Encounters:  02/01/18 (!) 332 lb 8 oz (150.8 kg)  01/23/18 (!) 336 lb (152.4 kg)  12/11/17 (!) 336 lb 9.6 oz (152.7 kg)    PHYSICAL EXAM BP 101/63   Pulse (!) 104   Ht 5' 10.5" (1.791 m)   Wt (!) 332 lb 8 oz (150.8 kg)   BMI 47.03 kg/m  Physical Exam  Constitutional: He is oriented to person, place, and time. No distress.  Morbidly obese gentleman who is clearly short of breath at baseline.  Carrying oxygen -chronically ill-appearing.  HENT:  Head: Normocephalic and atraumatic.  Mouth/Throat: No oropharyngeal exudate.  Eyes: EOM are normal. Pupils are equal, round, and reactive to light. No scleral icterus.  Neck: Normal range of motion. Neck supple. JVD (8-10 cmH2O, but difficult to assess) present.  Cardiovascular: S1 normal and S2 normal. An irregularly irregular rhythm present. Tachycardia present. PMI is displaced  (  Cannot fully palpate, but appears to be laterally displaced.). Exam reveals decreased pulses (Decreased bilateral pedal pulses secondary to edema.). Exam reveals no friction rub.  Murmur (Soft apical HSM.) heard. Unable to hear any gallop secondary to body habitus and A. fib RVR  Pulmonary/Chest: No respiratory distress.  Baseline increased work of breathing.  Does not appear to be in respiratory distress.  Mild expiratory wheezing.  Mild basilar rales.  Abdominal: Soft. Bowel sounds are normal. He exhibits no distension. There is no tenderness. There is no rebound.  Musculoskeletal: Normal range of motion. He exhibits edema (1-2+ lower extremity edema.).  Neurological: He is alert and oriented to person, place, and time. No cranial nerve deficit.  Skin: Skin is warm and dry.  Psychiatric: He has a normal mood and affect. His behavior is normal. Judgment and thought content normal.  Nursing note and vitals reviewed.    Adult ECG Report  Rate: 104 ;  Rhythm: atrial fibrillation and RVR. LAD (~ LAFB -64), RBBB, LVH.  Anteroseptal Infarct, age undetermined.;   Narrative Interpretation: stable EKG   Other studies Reviewed: Additional studies/ records that were reviewed today include:  Recent Labs:   Lab Results  Component Value Date   CREATININE 1.19 01/23/2018   BUN 22 01/23/2018   NA 143 01/23/2018   K 3.9 01/23/2018   CL 103 01/23/2018   CO2 31 01/23/2018   Lab Results  Component Value Date   CHOL 191 05/30/2017   HDL 43 05/30/2017   LDLCALC 99 05/30/2017   LDLDIRECT 164.8 09/12/2006   TRIG 247 (H) 05/30/2017   CHOLHDL 4.4 05/30/2017   Lab Results  Component Value Date   HGBA1C 6.0 (H) 05/30/2017  ` proBNP 411 (01/23/2018)  ASSESSMENT / PLAN: Problem List Items Addressed This Visit    Acute on chronic combined systolic and diastolic CHF (congestive heart failure) (HCC)    He has chronic combined systolic and diastolic heart failure but currently he is alert more short  of breath than usual with more than usual edema.  Unfortunately do not know his baseline and therefore I am not sure how aggressive to be.Marland Kitchen His heart rate remains somewhat elevated despite being on carvedilol and amiodarone and remains in A. Fib.   In order to determine the severity of illness we will check proBNP level  Remains on low-dose losartan (not taking lisinopril), but does not have adequate blood pressure to allow for further titration.  Need to refill digoxin, but need to check digoxin level and CMP to ensure that it is safe.    Follow-up in 2 months      Relevant Orders   Comprehensive metabolic panel   Digoxin level   Pro b natriuretic peptide (BNP)   Coronary artery disease involving native coronary artery of native heart with angina pectoris (HCC) (Chronic)    Multivessel CAD status post CABG with most recent Myoview negative last June.  Large anterior infarct by Myoview (although the echocardiogram cannot assess that) given the extent of disease in the left main and LAD, LAD infarct is probably not unlikely. Remains on moderate dose atorvastatin as well as carvedilol.  He is on Eliquis therefore not on aspirin or Plavix.  Does not appear that he is actively having any anginal symptoms.      Relevant Orders   Comprehensive metabolic panel   Digoxin level   Pro b natriuretic peptide (BNP)   Essential hypertension (Chronic)    No longer an adequate diagnosis with current blood  pressures in the 100s on carvedilol and low-dose losartan.  Need to monitor closely.  Hopefully if his pressures will be higher we could potentially consider switching from losartan to [facility].  Unfortunately Entresto may not be covered by the [place].      Hyperlipidemia (Chronic)    Most recent lipids in June 2018 showed LDL of 99.  This is not at goal.  Needs to be less than 60 close to 50.  He will probably need additional therapy.   Recheck lipid panel May switch from atorvastatin to Crestor and  consider Zetia as well.      Relevant Orders   Lipid panel   Comprehensive metabolic panel   Digoxin level   Pro b natriuretic peptide (BNP)   Ischemic cardiomyopathy    Severe multivessel disease with long-standing cardiomyopathy.  Finally evaluated for ischemic etiology couple years later and found to have multivessel disease now status post CABG.  Unfortunately blood pressure will not fully allow titration of CHF medications.  Would like to see the digoxin level and chemistry panel before restarting digoxin.      Relevant Orders   Comprehensive metabolic panel   Digoxin level   Pro b natriuretic peptide (BNP)   Long term current use of anticoagulant therapy (Chronic)    Switched from Xarelto to EliquisBut on low-dose Eliquis.  Need to determine if there is the correct dose based on renal function.  Checking CMP.      Morbid obesity (HCC) (Chronic)    He definitely needs weight loss -> morbid obesity is associated with OSA and OSH which is contributing to his heart failure.      Persistent atrial fibrillation (HCC) - Primary (Chronic)    Remains on amiodarone and beta-blocker for rate and rhythm control.  Has seem to be in A. fib every time he is been seen.  Rate is borderline tachycardic. Would like to restart digoxin pending digoxin level.  This patients CHA2DS2-VASc Score and unadjusted Ischemic Stroke Rate (% per year) is equal to 7.2 % stroke rate/year from a score of 5 Above score calculated as 1 point each if present [CHF, HTN, DM, Vascular=MI/PAD/Aortic Plaque, Age if 81-74, or Male]; 2 points each if present [Age > 75, or Stroke/TIA/TE] --Has apparently been switched from Xarelto to low-dose Eliquis (will need to determine appropriate dosing based on renal function) -- apparently he had bloody stools with Xarelto.        Relevant Orders   EKG 12-Lead (Completed)   Comprehensive metabolic panel   S/P CABG x 4 (Chronic)   Relevant Orders   Pro b natriuretic peptide  (BNP)      Mr. Magallon has a very extensive cardiac disease dating back for years.  I have reviewed 4 years worth of hospitalizations and cardiac studies including cardiac catheterizations and echocardiogram reports.  Total time with the patient's chart was well over an hour.  Total time with the patient was at least 45 minutes.  Current medicines are reviewed at length with the patient today. (+/- concerns) needs clarification of meds  The following changes have been made: see below  Patient Instructions  MEDICATION INSTRUCTION    TAKE YOUR SECOND DOSE OF LASIX 5 TO 6 HOURS AFTER THE FIRST DOSE.  NO OTHER CHANGES   LABS DO NOT EAT OR DRINK THE MORNING OF THE LABS AND DO NOT TAKE DIGOXIN THAT MORNING UNTIL TEST ARE COMPLETED. CMP PRO-BNP DIGOXIN FASTING LIPID    Your physician recommends that you schedule  a follow-up appointment in 2 MONTH DR Gautham Hewins. ( NEED RECORDS FROM DR [facility])  If you need a refill on your cardiac medications before your next appointment, please call your pharmacy.    Studies Ordered:   Orders Placed This Encounter  Procedures  . Lipid panel  . Comprehensive metabolic panel  . Digoxin level  . Pro b natriuretic peptide (BNP)  . EKG 12-Lead      Bryan Lemma, M.D., M.S. Interventional Cardiologist   Pager # [phone] Phone # [phone] [address] [place], [place] [address]   Thank you for choosing [facility] at [facility]!!

## 2018-02-01 NOTE — Patient Instructions (Addendum)
MEDICATION INSTRUCTION    TAKE YOUR SECOND DOSE OF LASIX 5 TO 6 HOURS AFTER THE FIRST DOSE.  NO OTHER CHANGES   LABS DO NOT EAT OR DRINK THE MORNING OF THE LABS AND DO NOT TAKE DIGOXIN THAT MORNING UNTIL TEST ARE COMPLETED. CMP PRO-BNP DIGOXIN FASTING LIPID    Your physician recommends that you schedule a follow-up appointment in 2 MONTH DR HARDING. ( NEED RECORDS FROM DR [facility])  If you need a refill on your cardiac medications before your next appointment, please call your pharmacy.

## 2018-02-06 ENCOUNTER — Encounter: Payer: Self-pay | Admitting: Cardiology

## 2018-02-06 NOTE — Assessment & Plan Note (Addendum)
Most recent lipids in June 2018 showed LDL of 99.  This is not at goal.  Needs to be less than 60 close to 50.  He will probably need additional therapy.   Recheck lipid panel May switch from atorvastatin to Crestor and consider Zetia as well.

## 2018-02-06 NOTE — Assessment & Plan Note (Signed)
Multivessel CAD status post CABG with most recent Myoview negative last June.  Large anterior infarct by Myoview (although the echocardiogram cannot assess that) given the extent of disease in the left main and LAD, LAD infarct is probably not unlikely. Remains on moderate dose atorvastatin as well as carvedilol.  He is on Eliquis therefore not on aspirin or Plavix.  Does not appear that he is actively having any anginal symptoms.

## 2018-02-06 NOTE — Assessment & Plan Note (Signed)
Switched from Xarelto to [employer] on low-dose Eliquis.  Need to determine if there is the correct dose based on renal function.  Checking CMP.

## 2018-02-06 NOTE — Assessment & Plan Note (Signed)
Remains on amiodarone and beta-blocker for rate and rhythm control.  Has seem to be in A. fib every time he is been seen.  Rate is borderline tachycardic. Would like to restart digoxin pending digoxin level.  This patients CHA2DS2-VASc Score and unadjusted Ischemic Stroke Rate (% per year) is equal to 7.2 % stroke rate/year from a score of 5 Above score calculated as 1 point each if present [CHF, HTN, DM, Vascular=MI/PAD/Aortic Plaque, Age if 77-74, or Male]; 2 points each if present [Age > 75, or Stroke/TIA/TE] --Has apparently been switched from Xarelto to low-dose Eliquis (will need to determine appropriate dosing based on renal function) -- apparently he had bloody stools with Xarelto.

## 2018-02-06 NOTE — Assessment & Plan Note (Signed)
He definitely needs weight loss -> morbid obesity is associated with OSA and OSH which is contributing to his heart failure.

## 2018-02-06 NOTE — Assessment & Plan Note (Addendum)
He has chronic combined systolic and diastolic heart failure but currently he is alert more short of breath than usual with more than usual edema.  Unfortunately do not know his baseline and therefore I am not sure how aggressive to be.Marland Kitchen His heart rate remains somewhat elevated despite being on carvedilol and amiodarone and remains in A. Fib.   In order to determine the severity of illness we will check proBNP level  Remains on low-dose losartan (not taking lisinopril), but does not have adequate blood pressure to allow for further titration.  Need to refill digoxin, but need to check digoxin level and CMP to ensure that it is safe.    Follow-up in 2 months

## 2018-02-06 NOTE — Assessment & Plan Note (Signed)
Severe multivessel disease with long-standing cardiomyopathy.  Finally evaluated for ischemic etiology couple years later and found to have multivessel disease now status post CABG.  Unfortunately blood pressure will not fully allow titration of CHF medications.  Would like to see the digoxin level and chemistry panel before restarting digoxin.

## 2018-02-06 NOTE — Assessment & Plan Note (Addendum)
No longer an adequate diagnosis with current blood pressures in the 100s on carvedilol and low-dose losartan.  Need to monitor closely.  Hopefully if his pressures will be higher we could potentially consider switching from losartan to [facility].  Unfortunately Entresto may not be covered by the [place].

## 2018-02-14 DIAGNOSIS — H353221 Exudative age-related macular degeneration, left eye, with active choroidal neovascularization: Secondary | ICD-10-CM | POA: Diagnosis not present

## 2018-02-14 DIAGNOSIS — H353211 Exudative age-related macular degeneration, right eye, with active choroidal neovascularization: Secondary | ICD-10-CM | POA: Diagnosis not present

## 2018-03-29 ENCOUNTER — Telehealth: Payer: Self-pay | Admitting: Internal Medicine

## 2018-03-29 NOTE — Telephone Encounter (Signed)
Okay to order?

## 2018-03-29 NOTE — Telephone Encounter (Signed)
Copied from CRM [id number]. Topic: Quick Communication - See Telephone Encounter >> Mar 29, 2018  3:09 PM Oneal Grout wrote: CRM for notification. See Telephone encounter for: 03/29/18. Case worker calling, patient would like to try a nebulizer instead of inhaler. Please place order through [facility] Please advise

## 2018-03-30 NOTE — Telephone Encounter (Signed)
Okay for nebulizer treatment

## 2018-04-02 DIAGNOSIS — H353221 Exudative age-related macular degeneration, left eye, with active choroidal neovascularization: Secondary | ICD-10-CM | POA: Diagnosis not present

## 2018-04-02 DIAGNOSIS — H353231 Exudative age-related macular degeneration, bilateral, with active choroidal neovascularization: Secondary | ICD-10-CM | POA: Diagnosis not present

## 2018-04-04 ENCOUNTER — Encounter: Payer: Self-pay | Admitting: Cardiology

## 2018-04-04 ENCOUNTER — Ambulatory Visit: Payer: Medicare Other | Admitting: Cardiology

## 2018-04-04 VITALS — BP 140/80 | HR 53 | Ht 70.5 in | Wt 330.0 lb

## 2018-04-04 DIAGNOSIS — I255 Ischemic cardiomyopathy: Secondary | ICD-10-CM | POA: Diagnosis not present

## 2018-04-04 DIAGNOSIS — I4819 Other persistent atrial fibrillation: Secondary | ICD-10-CM

## 2018-04-04 DIAGNOSIS — I5042 Chronic combined systolic (congestive) and diastolic (congestive) heart failure: Secondary | ICD-10-CM

## 2018-04-04 DIAGNOSIS — I481 Persistent atrial fibrillation: Secondary | ICD-10-CM

## 2018-04-04 DIAGNOSIS — I10 Essential (primary) hypertension: Secondary | ICD-10-CM

## 2018-04-04 DIAGNOSIS — I25119 Atherosclerotic heart disease of native coronary artery with unspecified angina pectoris: Secondary | ICD-10-CM | POA: Diagnosis not present

## 2018-04-04 DIAGNOSIS — E785 Hyperlipidemia, unspecified: Secondary | ICD-10-CM | POA: Diagnosis not present

## 2018-04-04 MED ORDER — SPIRONOLACTONE 25 MG PO TABS: 12.5000 mg | ORAL_TABLET | Freq: Every day | ORAL | 3 refills | Status: DC

## 2018-04-04 MED ORDER — LISINOPRIL 5 MG PO TABS: 5.0000 mg | ORAL_TABLET | Freq: Every day | ORAL | 3 refills | Status: DC

## 2018-04-04 NOTE — Progress Notes (Signed)
PCP: [name], MD  Clinic Note: Chief Complaint  Patient presents with  . Follow-up    pt denies chest pains, swelling in hands/feet, pt does complain of SOB due to allergies    HPI: Charles Mckinney is a 67 y.o. male with a PMH notable for ischemic cardiomyopathy-CABG (EF 30-35%) with A. Fib (along with obesity, hypertension, hyperlipidemia) who presents today for follow-up after initially establishing cardiology care.  He was initially seen on February 01, 2018 at the request of [name], MD. Shirline Frees, NP   Most Recent Hospitalization: None since last visit  Studies Personally Reviewed - (if available, images/films reviewed: From Epic Chart or Care Everywhere)  none  MAUREEN DUESING was seen on February 01, 2018 in order to establish new cardiology care. --Was noted to have more edema and dyspnea than before.  I checked a proBNP level that was mildly elevated. He apparently--> was not taking losartan but was taking lisinopril.  Did plan to check a a digoxin level  Interval History: Hawke returns today for follow-up saying that he still has intermittent lower extremity swelling.  He has been taking his Lasix twice a day, but like 8 in the morning and 8 at night.  He says he feels that his breathing is better since using his inhaler more.  Despite having the swelling, he really has not noticed that much positional dyspnea.  He sleeps with a wedge pillow that is more because of GERD type discomfort a as opposed to dyspnea. No real PND symptoms and mild lower extremity edema.  Usually controlled with Lasix.  He still denies any chest tightness or pressure with, unless he is " hassling to breathe"   Otherwise stable otherwise with no sense of rapid irregular heartbeats or palpitations. Continues to note shortness of breath with exertion, but this is probably multifactorial. He does not have any sense of irregular heartbeats or palpitations, and has no  sense of being in A. fib.  No chest syncope/near syncope. No TIA/amaurosis fugax symptoms. No melena, hematochezia, hematuria, or epstaxis. No claudication.  He tells me that he has been diagnosed with OSA, but never did go for CPAP, because he could not afford it.  He is very worried about how much medications will cost him.  ROS: A comprehensive was performed. Review of Systems  Constitutional: Positive for malaise/fatigue.  HENT: Negative for congestion, hearing loss and nosebleeds.   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  Respiratory: Positive for shortness of breath (At baseline but worse with exertion). Negative for cough (Off and on the morning).   Cardiovascular: Positive for chest pain (Off-and-on sharp pains) and leg swelling.       Unable to really assess PND orthopnea because OSA.  Gastrointestinal: Positive for heartburn and nausea. Negative for blood in stool and melena.  Genitourinary: Negative for hematuria.  Musculoskeletal: Positive for back pain and joint pain. Negative for myalgias.  Skin: Negative for itching and rash.  Neurological: Positive for weakness (Global weakness, not able to do what he is able to do before.). Negative for dizziness.  Psychiatric/Behavioral: Negative.   All other systems reviewed and are negative.  I have reviewed and (if needed) personally updated the patient's problem list, medications, allergies, past medical and surgical history, social and family history.   Past Medical History:  Diagnosis Date  . Anxiety   . CAD, multiple vessel  04/2015   Right and Left Heart Catheterization Apr 09, 2015: LM 95%; ostLAD 99% - p-dLAD 80%, D1 99% - D2 80%; OCx 90%,pCx 50%; pRCA 80%, mRCA 50%, rPDA 80% & 90%. --> CABG x 4: LIMA-LAD, SVG-Diag, SeqSVG-rPL-rPDA --> Intra-Op TEE showed EF of 30% with only mild MR.   . Chronic combined systolic and diastolic heart failure, NYHA class 3 (HCC) 01/2014   EF  is been ranging from 30-35% since February 2015.  Suggestion of anterior infarct on Myoview/echo.  Marland Kitchen GERD (gastroesophageal reflux disease)   . Hyperlipidemia   . Hypertension   . Hypertensive heart disease    Mild to moderately dilated LV with mild LVH.  . Ischemic cardiomyopathy 01/2014   EF 30-35%.  Severe multivessel CAD noted on cath--CABG x4 -> Myoview (June 2018) shows large anterior-septal-apical infarct with no ischemia.  . Kidney stones   . Obesity (BMI 30-39.9)   . Osteoarthritis   . Permanent atrial fibrillation (HCC)    On Amiodarone & Beta Blocker (on Xarelto)  . PONV (postoperative nausea and vomiting)     Cardiac History:   2014: Nonischemic Cardiolite Stress Test with EF in the 50s.  (Followed by Dr. Donnie Aho)  February 2015: Admitted with positive troponins and acute CHF (echo showed EF of 30 to 35% with anterior hypokinesis)  Was diuresed, but he [facility] ->  Lost to follow-up until January 2016  January 2016: ER with A. fib RVR and CHF --> left AMA; wanted to follow-up at the [facility]  February 2016: Admitted again with A. fib RVR and CHF.  (Apparently had not been taking Xarelto because he "was told that it was dangerous ").  Eventually underwent DCCV --> went back to following up with [facility]  March 31, 2015 (admitted by Dr. Orpah Cobb) -W/ chest tightness and CHF (PND and orthopnea) in Afib RVR ->  DCCV; D/C on lisinopril, carvedilol, furosemide, amiodarone and Xarelto.  Readmitted Apr 05, 2015 - CHF (PND, orthopnea and edema with progressing exertional dyspnea).   hypoxic SaO2 % in 80s requiring BiPAP and IV Lasix.  -->    Finally had an ischemic evaluation.  CATH --> multivessel CAD   CABG x 4: LIMA-LAD, SVG-Diag, SeqSVG-rPL-rPDA -->   Intra-Op TEE showed EF of 30% with only mild MR.  EF did appear to be improved post bypass  January 2018: Admitted again for CHF -> changed lisinopril to losartan w/ plans to switch to  [facility].  May 2018: Admitted with TIA - unable to afford Eliquis --> back on Xarelto.->  Apparently Xarelto was been stopped for eye bleed by his ophthalmologist; continued on amiodarone and beta-blocker for A. fib;    May 31, 2017: chest pain 8 out of 10 pressure-like nonradiating associated with dyspnea.  Mild congestive heart failure noted on chest x-ray.    Myoview showed no ischemia.  Remain rate controlled on amiodarone.  Has been converted from Eliquis  Past Surgical History:  Procedure Laterality Date  . CARDIAC CATHETERIZATION N/A 04/09/2015   Procedure: Right/Left Heart Cath and Coronary Angiography;  Surgeon: Orpah Cobb, MD;  Location: [facility] INVASIVE CV LAB CUPID::  LM 95%; ostLAD 99% - p-dLAD 80%, D1 99% - D2 80%; OCx 90%,pCx 50%; pRCA 80%, mRCA 50%, rPDA 80% & 90%.  . CARDIOVERSION N/A 01/19/2015   Procedure: CARDIOVERSION;  Surgeon: Othella Boyer, MD;  Location: [facility] ENDOSCOPY;  Service: Cardiovascular;  Laterality: N/A;  pt in Afib, 12:22 synched cardioversion @  120 joules using Propofol 100 mg,IV....unsuccessful, repeated at 200 joules  . CARDIOVERSION N/A 04/02/2015   Procedure: CARDIOVERSION;  Surgeon: Orpah Cobb, MD;  Location: [facility] ENDOSCOPY;  Service: Cardiovascular;  Laterality: N/A;  . CATARACT EXTRACTION    . CLIPPING OF ATRIAL APPENDAGE  04/13/2015   Procedure: CLIPPING OF ATRIAL APPENDAGE;  Surgeon: Alleen Borne, MD;  Location: [facility] OR;  Service: Open Heart Surgery;;  . CORONARY ARTERY BYPASS GRAFT N/A 04/13/2015   Procedure: CORONARY ARTERY BYPASS GRAFTING (CABG);  Surgeon: Alleen Borne, MD;  Location: [facility] OR;  Service: Open Heart Surgery;; LIMA-LAD, SVG-Diag, SeqSVG-rPL-rPDA   . NM MYOVIEW LTD  05/2017   EF 27%.  No reversible ischemia.  Large anterior-apical and septal infarct.  Diffuse LV HK and moderate LV dilation.  . TEE WITHOUT CARDIOVERSION N/A 04/13/2015   Procedure: TRANSESOPHAGEAL ECHOCARDIOGRAM (TEE);  Surgeon: Alleen Borne, MD;  Location: [facility] OR;  Service:  Open Heart Surgery;  Laterality: N/A;  . TRANSTHORACIC ECHOCARDIOGRAM  2/'15, 2/'16, 4/'16   a) EF 30-35%.  Mild LVH.  GRII DD.  Mild LA dilation.  Moderate diastolic dysfunction. b) Technically difficult.  Mild LVH.  EF 35%.  Diffuse HK.  Moderate LA dilation.  Mildly dilated RV with mildly reduced function.  c)   Mild concentric LVH.  EF 30-35%, diffuse HK.  Severe HK of mid apical inferior wall.  Moderate LA dilation.  . TRANSTHORACIC ECHOCARDIOGRAM N/A 1/18, 5/18, 7/18   a) Moderate severely reduced LV.  EF 30 of 35% with diffuse HK.  Mild MR.  Mildly dilated RA.;; b) (In setting of TIA).  Technically difficult mild to moderate globally reduced LV function.  Mild LVH.  Mild MR.  EF estimated 40-45%.  Moderate LA dilation.;; c)  Afib. Mild to mod dilated LV with mild LVH. EF 30-35% with diffuse HK. Mildly dilated RV with mildly decrease Systolic Fxn. Mild MR    Transthoracic Echos:  February 01, 2014: EF 30-35%.  Mild LVH.  GRII DD.  Mild LA dilation.  Moderate diastolic dysfunction.  May 31, 2017: atrial fibrillation. Mild to moderately dilated LV with mild LV hypertrophy. EF 30-35% with diffuse hypokinesis. Mildly dilated RV with mildly decreased systolic function. Mild mitral regurgitation   R&L [facility] Apr 09, 2015: LM 95%; ostLAD 99% - p-dLAD 80%, D1 99% - D2 80%; OCx 90%,pCx 50%; pRCA 80%, mRCA 50%, rPDA 80% & 90%.   CABG x 4: LIMA-LAD, SVG-Diag, SeqSVG-rPL-rPDA -->   Current Meds  Medication Sig  . albuterol (PROAIR HFA) 108 (90 Base) MCG/ACT inhaler INHALE 1 PUFF INTO THE LUNGS EVERY 6 HOURS AS NEEDED FOR WHEEZING OR SHORTNESS OF BREATH  . allopurinol (ZYLOPRIM) 100 MG tablet Take 2 tablets (200 mg total) by mouth daily.  Marland Kitchen amiodarone (PACERONE) 200 MG tablet TAKE 1 TABLET(200 MG) BY MOUTH DAILY  . apixaban (ELIQUIS) 2.5 MG TABS tablet Take 1 tablet (2.5 mg total) by mouth 2 (two) times daily.  Marland Kitchen atorvastatin (LIPITOR) 40 MG tablet Take 1 tablet (40 mg total) by mouth daily.  .  carvedilol (COREG) 6.25 MG tablet TAKE 1 TABLET(6.25 MG) BY MOUTH TWICE DAILY WITH A MEAL  . Cholecalciferol (VITAMIN D PO) Take 1 tablet by mouth daily.  . colchicine 0.6 MG tablet TAKE 1 TABLET(0.6 MG) BY MOUTH TWICE DAILY  . DIGOX 125 MCG tablet TK 1 T PO QD  . fluticasone (FLONASE) 50 MCG/ACT nasal spray Place 1 spray into both nostrils daily.  . furosemide (LASIX) 80 MG tablet Take  80 mg by mouth 2 (two) times daily.  Marland Kitchen HYDROcodone-acetaminophen (NORCO/VICODIN) 5-325 MG tablet Take 1 tablet by mouth every 6 (six) hours as needed.  . nitroGLYCERIN (NITROSTAT) 0.4 MG SL tablet Place 0.4 mg under the tongue every 5 (five) minutes as needed for chest pain.  . potassium chloride SA (K-DUR,KLOR-CON) 20 MEQ tablet Take 1 tablet (20 mEq total) by mouth 2 (two) times daily.  . [DISCONTINUED] lisinopril (PRINIVIL,ZESTRIL) 2.5 MG tablet TAKE 1 TABLET(2.5 MG) BY MOUTH DAILY  . [DISCONTINUED] losartan (COZAAR) 25 MG tablet Take 1 tablet (25 mg total) by mouth daily.  Not taking Losartan.  Allergies  Allergen Reactions  . Niacin And Related Other (See Comments)    FLUSHING  . Xarelto [Rivaroxaban] Other (See Comments)    Cause bloody stools  . Penicillins Itching, Rash and Other (See Comments)    Has patient had a PCN reaction causing immediate rash, facial/tongue/throat swelling, SOB or lightheadedness with hypotension: Yes Has patient had a PCN reaction causing severe rash involving mucus membranes or skin necrosis: No Has patient had a PCN reaction that required hospitalization: No Has patient had a PCN reaction occurring within the last 10 years: No If all of the above answers are "NO", then may proceed with Cephalosporin use.     Social History   Tobacco Use  . Smoking status: Former Smoker    Last attempt to quit: 2000    Years since quitting: 19.3  . Smokeless tobacco: Never Used  Substance Use Topics  . Alcohol use: No  . Drug use: No   Social History   Social History  Narrative  . Not on file    family history includes Congestive Heart Failure in his sister; Diabetes in his brother and mother; Heart disease in his brother and father; Stroke in his brother.  Wt Readings from Last 3 Encounters:  04/04/18 (!) 330 lb (149.7 kg)  02/01/18 (!) 332 lb 8 oz (150.8 kg)  01/23/18 (!) 336 lb (152.4 kg)    PHYSICAL EXAM BP 140/80 (BP Location: Left Arm)   Pulse (!) 53   Ht 5' 10.5" (1.791 m)   Wt (!) 330 lb (149.7 kg)   BMI 46.68 kg/m  Physical Exam  Constitutional: He is oriented to person, place, and time. No distress.  Morbidly obese gentleman who is clearly short of breath at baseline.  Carrying oxygen -chronically ill-appearing.  HENT:  Head: Normocephalic and atraumatic.  Mouth/Throat: No oropharyngeal exudate.  Eyes: Pupils are equal, round, and reactive to light. EOM are normal. No scleral icterus.  Neck: Normal range of motion. Neck supple. JVD (8-10 cmH2O, but difficult to assess) present.  Cardiovascular: S1 normal and S2 normal. An irregularly irregular rhythm present. Tachycardia present. PMI is displaced (Cannot fully palpate, but appears to be laterally displaced.). Exam reveals decreased pulses (Decreased bilateral pedal pulses secondary to edema.). Exam reveals no friction rub.  Murmur (Soft apical HSM.) heard. Unable to hear any gallop secondary to body habitus and A. fib RVR  Pulmonary/Chest: No respiratory distress.  Baseline increased work of breathing.  Does not appear to be in respiratory distress.  Mild expiratory wheezing.  Mild basilar rales.  Abdominal: Soft. Bowel sounds are normal. He exhibits no distension. There is no tenderness. There is no rebound.  Musculoskeletal: Normal range of motion. He exhibits edema (1-2+ lower extremity edema.).  Neurological: He is alert and oriented to person, place, and time. No cranial nerve deficit.  Skin: Skin is warm and dry.  Psychiatric: He has a normal mood and affect. His behavior is  normal. Judgment and thought content normal.  Nursing note and vitals reviewed.    Adult ECG Report Not checked  Other studies Reviewed: Additional studies/ records that were reviewed today include:  Recent Labs:   Lab Results  Component Value Date   CREATININE 1.19 01/23/2018   BUN 22 01/23/2018   NA 143 01/23/2018   K 3.9 01/23/2018   CL 103 01/23/2018   CO2 31 01/23/2018   Lab Results  Component Value Date   CHOL 191 05/30/2017   HDL 43 05/30/2017   LDLCALC 99 05/30/2017   LDLDIRECT 164.8 09/12/2006   TRIG 247 (H) 05/30/2017   CHOLHDL 4.4 05/30/2017   Lab Results  Component Value Date   HGBA1C 6.0 (H) 05/30/2017  ` proBNP 411 (01/23/2018)  ASSESSMENT / PLAN: Problem List Items Addressed This Visit    Persistent atrial fibrillation (HCC): CHA2DS2Vasc 6 (Chronic)    Again he seems to be in A. fib despite being on amiodarone, carvedilol,   As well as digoxin  He is now on Eliquis -at reduced dose  (will need to reassess this dose).  Rate seems to be better controlled.      Relevant Medications   spironolactone (ALDACTONE) 25 MG tablet   lisinopril (PRINIVIL,ZESTRIL) 5 MG tablet   Other Relevant Orders   Digoxin level   Morbid obesity (HCC) (Chronic)    This is late with his mobility and obesity.  He clearly has OSA, but claims to be unable to afford CPAP.  (This reason that I am leery of trying Entresto). I am quite certain that he also has obesity hypoventilation syndrome.      Relevant Orders   Lipid panel   Ischemic cardiomyopathy (Chronic)    Severe multivessel disease related to cardiomyopathy.  Apparently this probably went several years without being diagnosed, and it was not until he went to A. fib that was finally identified.  We talked about the possibility of EP consult to discuss ICD - which may allow more aggressive control of afib.  -- not ready yet  -- Will continue with Carvedilol @ current dose, increase Lisinopril to 5 mg (with plan to  convert to [facility] if possible during f/u visit with Azalee Course, PA), add Spironolactone 25 mg. Change Lasix dose interval to 8 AM & 2PM.       Relevant Medications   spironolactone (ALDACTONE) 25 MG tablet   lisinopril (PRINIVIL,ZESTRIL) 5 MG tablet   Other Relevant Orders   Brain natriuretic peptide   Comprehensive metabolic panel   Digoxin level   Hyperlipidemia with target LDL less than 70 (Chronic)    Has not had labs checked since June 2018.  We will go ahead and check lipid panel and chemistry panel.  We will also check a BNP level and digoxin level.      Relevant Medications   spironolactone (ALDACTONE) 25 MG tablet   lisinopril (PRINIVIL,ZESTRIL) 5 MG tablet   Other Relevant Orders   Comprehensive metabolic panel   Lipid panel   Essential hypertension (Chronic)   Relevant Medications   spironolactone (ALDACTONE) 25 MG tablet   lisinopril (PRINIVIL,ZESTRIL) 5 MG tablet   Coronary artery disease involving native coronary artery of native heart with angina pectoris (HCC) (Chronic)    Finally diagnosed with multivessel disease after several admissions for CHF. Referred for CABG, and most recent Myoview June 2018 was negative for ischemia.  It did show a large anterior  infarct.  On atorvastatin and carvedilol plus lisinopril.   Not on aspirin or Plavix because of Eliquis      Relevant Medications   spironolactone (ALDACTONE) 25 MG tablet   lisinopril (PRINIVIL,ZESTRIL) 5 MG tablet   Other Relevant Orders   Brain natriuretic peptide   Comprehensive metabolic panel   Lipid panel   Digoxin level   Chronic combined systolic and diastolic CHF, NYHA class 3 (HCC) - Primary (Chronic)    He seems to be relatively euvolemic with controlled edema on exam.  Currently taking 80 mg twice daily Lasix --->  however, he is not taking them correctly is taking them at 8 AM and 8 PM he should take them more closer to 6 hours apart. -->  He is on digoxin, will check digoxin level  He is  on and again taking lisinopril and not losartan.  I would like to eventually converted to [facility], but it seems that he is so concerned with financial issues that I want to get him stable with current meds first.  Plan: Increase lisinopril to 5 mg daily.  Check digoxin level and BNP level. Add Spironolactone 25 mg daily We will need to look into his prior coverage to see if he will be able to convert to [facility].      Relevant Medications   spironolactone (ALDACTONE) 25 MG tablet   lisinopril (PRINIVIL,ZESTRIL) 5 MG tablet   Other Relevant Orders   Brain natriuretic peptide   Digoxin level      Plan for today: Increase lisinopril dose to 5 mg, start spironolactone 25 g. 26-month follow-up with Azalee Course, PA with intention of potentially convert to [facility].  Current medicines are reviewed at length with the patient today. (+/- concerns) needs clarification of meds  The following changes have been made: see below  Patient Instructions  MEDICATION INSTRUCTIONS  STOP TAKING LOSARTAN STOP  TAKING LISINOPRIL 2.5 MG    START  LISINOPRIL TO 5 MG  ONE TABLET DAILY START SPIRONOLACTONE 12.5 MG ( TAKE 1/2 TABLET OF 25 MG ) DAILY    LABS IN 2 WEEKS AFTER STARTING THE MEDICATION ABOVE DO NOT EAT OR DRINK OR TAKE MEDICATION THE MORNING OF THE TEST UNTIL YOU HAVE BLOOD WORK DONE LIPIDS CMP BNP DIGOXIN LEVEL    Your physician recommends that you schedule a follow-up appointment in 3 MONTH WITH HAO ,PA   Your physician wants you to follow-up in 6 MONTH WITH DR HARDING. You will receive a reminder letter in the mail two months in advance. If you don't receive a letter, please call our office to schedule the follow-up appointment.   If you need a refill on your cardiac medications before your next appointment, please call your pharmacy.     Studies Ordered:   Orders Placed This Encounter  Procedures  . Brain natriuretic peptide  . Comprehensive metabolic panel  . Lipid  panel  . Digoxin level      Bryan Lemma, M.D., M.S. Interventional Cardiologist   Pager # [phone] Phone # [phone] [address] [place], [place] [address]   Thank you for choosing [facility] at [facility]!!

## 2018-04-04 NOTE — Patient Instructions (Signed)
MEDICATION INSTRUCTIONS  STOP TAKING LOSARTAN STOP  TAKING LISINOPRIL 2.5 MG    START  LISINOPRIL TO 5 MG  ONE TABLET DAILY START SPIRONOLACTONE 12.5 MG ( TAKE 1/2 TABLET OF 25 MG ) DAILY    LABS IN 2 WEEKS AFTER STARTING THE MEDICATION ABOVE DO NOT EAT OR DRINK OR TAKE MEDICATION THE MORNING OF THE TEST UNTIL YOU HAVE BLOOD WORK DONE LIPIDS CMP BNP DIGOXIN LEVEL    Your physician recommends that you schedule a follow-up appointment in 3 MONTH WITH HAO ,PA   Your physician wants you to follow-up in 6 MONTH WITH DR HARDING. You will receive a reminder letter in the mail two months in advance. If you don't receive a letter, please call our office to schedule the follow-up appointment.   If you need a refill on your cardiac medications before your next appointment, please call your pharmacy.

## 2018-04-05 ENCOUNTER — Other Ambulatory Visit: Payer: Self-pay

## 2018-04-05 DIAGNOSIS — G4733 Obstructive sleep apnea (adult) (pediatric): Secondary | ICD-10-CM

## 2018-04-05 NOTE — Telephone Encounter (Signed)
Left message about rx sent in.

## 2018-04-05 NOTE — Telephone Encounter (Signed)
Charles Mckinney with [facility] states he needs a Dx code for pt nebulizer.  Please contact Jason @ [phone]

## 2018-04-05 NOTE — Telephone Encounter (Signed)
Per [facility] pt does not have any covered dx's for a nebulizer. He states based on chart notes pt may need walk to establish O2 sats d/t CHF and ongoing exertional dyspnea. Pt would qualify for O2 based on current dx's and if he desats with walk. Pt may also benefit from pulm referral.  Dr. Kirtland Bouchard - Please advise. Thanks!

## 2018-04-06 ENCOUNTER — Encounter: Payer: Self-pay | Admitting: Cardiology

## 2018-04-06 NOTE — Assessment & Plan Note (Addendum)
Again he seems to be in A. fib despite being on amiodarone, carvedilol,   As well as digoxin  He is now on Eliquis -at reduced dose  (will need to reassess this dose).  Rate seems to be better controlled.

## 2018-04-06 NOTE — Assessment & Plan Note (Signed)
Finally diagnosed with multivessel disease after several admissions for CHF. Referred for CABG, and most recent Myoview June 2018 was negative for ischemia.  It did show a large anterior infarct.  On atorvastatin and carvedilol plus lisinopril.   Not on aspirin or Plavix because of Eliquis

## 2018-04-06 NOTE — Assessment & Plan Note (Signed)
This is late with his mobility and obesity.  He clearly has OSA, but claims to be unable to afford CPAP.  (This reason that I am leery of trying Entresto). I am quite certain that he also has obesity hypoventilation syndrome.

## 2018-04-06 NOTE — Assessment & Plan Note (Signed)
Severe multivessel disease related to cardiomyopathy.  Apparently this probably went several years without being diagnosed, and it was not until he went to A. fib that was finally identified.  We talked about the possibility of EP consult to discuss ICD - which may allow more aggressive control of afib.  -- not ready yet  -- Will continue with Carvedilol @ current dose, increase Lisinopril to 5 mg (with plan to convert to [facility] if possible during f/u visit with Azalee Course, PA), add Spironolactone 25 mg. Change Lasix dose interval to 8 AM & 2PM.

## 2018-04-06 NOTE — Assessment & Plan Note (Signed)
He seems to be relatively euvolemic with controlled edema on exam.  Currently taking 80 mg twice daily Lasix --->  however, he is not taking them correctly is taking them at 8 AM and 8 PM he should take them more closer to 6 hours apart. -->  He is on digoxin, will check digoxin level  He is on and again taking lisinopril and not losartan.  I would like to eventually converted to [facility], but it seems that he is so concerned with financial issues that I want to get him stable with current meds first.  Plan: Increase lisinopril to 5 mg daily.  Check digoxin level and BNP level. Add Spironolactone 25 mg daily We will need to look into his prior coverage to see if he will be able to convert to [facility].

## 2018-04-06 NOTE — Assessment & Plan Note (Signed)
Has not had labs checked since June 2018.  We will go ahead and check lipid panel and chemistry panel.  We will also check a BNP level and digoxin level.

## 2018-04-09 DIAGNOSIS — H353211 Exudative age-related macular degeneration, right eye, with active choroidal neovascularization: Secondary | ICD-10-CM | POA: Diagnosis not present

## 2018-04-09 NOTE — Telephone Encounter (Signed)
Suggest that patient follow-up with pulmonary medicine who patient sees for OSA.

## 2018-04-09 NOTE — Telephone Encounter (Signed)
Spoke with [facility] and advised on Dr. Charm Rings recommendation pt needs to follow up with Pulm. He will let pt know. Nothing further needed at this time.

## 2018-04-10 ENCOUNTER — Encounter: Payer: Self-pay | Admitting: *Deleted

## 2018-04-10 NOTE — Telephone Encounter (Signed)
This encounter was created in error - please disregard.

## 2018-04-10 NOTE — Telephone Encounter (Signed)
Copied from CRM [id number]. Topic: Quick Communication - See Telephone Encounter >> Apr 10, 2018 11:33 AM Floria Raveling A wrote: [facility] called in to check the status of ordering this pt a nebulizer??    Contact pt to let pt know the status

## 2018-04-10 NOTE — Telephone Encounter (Deleted)
Copied from CRM [id number]. Topic: Quick Communication - See Telephone Encounter >> Mar 29, 2018  3:09 PM Oneal Grout wrote: CRM for notification. See Telephone encounter for: 03/29/18. Case worker calling, patient would like to try a nebulizer instead of inhaler. Please advise >> Apr 10, 2018 11:33 AM Floria Raveling A wrote: [facility] called in to check the status of ordering this pt a nebulizer??    Contact pt to let pt know the status

## 2018-04-13 ENCOUNTER — Telehealth: Payer: Self-pay | Admitting: Family Medicine

## 2018-04-13 NOTE — Telephone Encounter (Signed)
Copied from CRM [id number]. Topic: General - Other >> Apr 13, 2018 12:39 PM Percival Spanish wrote:  Pt said he has spoken with Enrique Sack about this   [facility] call and said [facility] need office notes,diagnos code  for the nebulizer.  Can reach out to pt for more information   [phone]

## 2018-04-18 ENCOUNTER — Other Ambulatory Visit: Payer: Self-pay

## 2018-04-19 ENCOUNTER — Other Ambulatory Visit: Payer: Self-pay

## 2018-04-19 MED ORDER — ALBUTEROL SULFATE (2.5 MG/3ML) 0.083% IN NEBU: 2.5000 mg | INHALATION_SOLUTION | Freq: Four times a day (QID) | RESPIRATORY_TRACT | 1 refills | Status: DC | PRN

## 2018-04-19 NOTE — Telephone Encounter (Signed)
Albuterol for nebulizer sent to pharmacy. Left detailed message informing pt.

## 2018-04-19 NOTE — Telephone Encounter (Signed)
Previously spoke with [facility] and he states that [facility] will not cover nebulizer with pt's current dx codes. Explained to pt. He is going to purchase nebulizer on his own as he can get from [facility] pharmacy for $45. He would like to have medication sent there so he can start using it. He states that with allergy season he is having more congestion and often feels ShOB and has some wheezing.   Dr. Kirtland Bouchard - Please advise on medication for nebulizer. Thanks!

## 2018-04-19 NOTE — Telephone Encounter (Signed)
Patient calling and states that [facility] is telling him that we denied nebulizer. Call back [phone]

## 2018-05-11 DIAGNOSIS — H353221 Exudative age-related macular degeneration, left eye, with active choroidal neovascularization: Secondary | ICD-10-CM | POA: Diagnosis not present

## 2018-06-11 ENCOUNTER — Other Ambulatory Visit: Payer: Self-pay | Admitting: Internal Medicine

## 2018-06-11 DIAGNOSIS — E785 Hyperlipidemia, unspecified: Secondary | ICD-10-CM

## 2018-06-13 ENCOUNTER — Encounter: Payer: Self-pay | Admitting: Family Medicine

## 2018-06-13 ENCOUNTER — Ambulatory Visit (INDEPENDENT_AMBULATORY_CARE_PROVIDER_SITE_OTHER): Payer: Medicare Other | Admitting: Family Medicine

## 2018-06-13 VITALS — BP 130/86 | HR 67 | Temp 97.8°F | Resp 16 | Ht 70.5 in | Wt 330.2 lb

## 2018-06-13 DIAGNOSIS — R739 Hyperglycemia, unspecified: Secondary | ICD-10-CM | POA: Diagnosis not present

## 2018-06-13 DIAGNOSIS — N183 Chronic kidney disease, stage 3 unspecified: Secondary | ICD-10-CM

## 2018-06-13 DIAGNOSIS — I5042 Chronic combined systolic (congestive) and diastolic (congestive) heart failure: Secondary | ICD-10-CM | POA: Diagnosis not present

## 2018-06-13 DIAGNOSIS — M1A00X0 Idiopathic chronic gout, unspecified site, without tophus (tophi): Secondary | ICD-10-CM

## 2018-06-13 DIAGNOSIS — I10 Essential (primary) hypertension: Secondary | ICD-10-CM

## 2018-06-13 DIAGNOSIS — Z9189 Other specified personal risk factors, not elsewhere classified: Secondary | ICD-10-CM | POA: Diagnosis not present

## 2018-06-13 DIAGNOSIS — R0600 Dyspnea, unspecified: Secondary | ICD-10-CM

## 2018-06-13 DIAGNOSIS — Z1159 Encounter for screening for other viral diseases: Secondary | ICD-10-CM

## 2018-06-13 LAB — MICROALBUMIN / CREATININE URINE RATIO
CREATININE, U: 211.4 mg/dL
MICROALB UR: 5.3 mg/dL — AB (ref 0.0–1.9)
Microalb Creat Ratio: 2.5 mg/g (ref 0.0–30.0)

## 2018-06-13 LAB — BASIC METABOLIC PANEL
BUN: 20 mg/dL (ref 6–23)
CO2: 29 meq/L (ref 19–32)
CREATININE: 1.17 mg/dL (ref 0.40–1.50)
Calcium: 9.3 mg/dL (ref 8.4–10.5)
Chloride: 104 mEq/L (ref 96–112)
GFR: 65.92 mL/min (ref >60.00)
GLUCOSE: 146 mg/dL — AB (ref 70–99)
Potassium: 4.1 mEq/L (ref 3.5–5.1)
SODIUM: 141 meq/L (ref 135–145)

## 2018-06-13 LAB — HEMOGLOBIN A1C: HEMOGLOBIN A1C: 6.4 % (ref 4.6–6.5)

## 2018-06-13 LAB — VITAMIN D 25 HYDROXY (VIT D DEFICIENCY, FRACTURES): VITD: 10.51 ng/mL — ABNORMAL LOW (ref 30.00–100.00)

## 2018-06-13 LAB — URIC ACID: Uric Acid, Serum: 8.7 mg/dL — ABNORMAL HIGH (ref 4.0–7.8)

## 2018-06-13 MED ORDER — FLUTICASONE-SALMETEROL 250-50 MCG/DOSE IN AEPB: 1.0000 | INHALATION_SPRAY | Freq: Two times a day (BID) | RESPIRATORY_TRACT | 3 refills | Status: DC

## 2018-06-13 NOTE — Patient Instructions (Addendum)
A few things to remember from today's visit:   CKD (chronic kidney disease), stage III (HCC) - Plan: Basic metabolic panel, VITAMIN D 25 Hydroxy (Vit-D Deficiency, Fractures), Microalbumin / creatinine urine ratio  Essential hypertension - Plan: Basic metabolic panel  Chronic combined systolic and diastolic CHF, NYHA class 3 (HCC)  Hyperglycemia - Plan: Hemoglobin A1c  Idiopathic chronic gout without tophus, unspecified site - Plan: Uric acid  Low-salt diet is very important, every time you eat frozen food or premade pasta you eating a lot of salt.  If possible cooked your meals at home.  Please be sure medication list is accurate. If a new problem present, please set up appointment sooner than planned today.

## 2018-06-13 NOTE — Progress Notes (Signed)
HPI:   [name] is a 67 y.o. male, who is here today to establish care.  Former PCP:Dr Charles Mckinney Last preventive routine visit: 04/2017  Chronic medical problems: HTN,HF,HLD,gout,CAD,chronic dyspnea, and OA among some..  Atrial fib on Eliquis and Amiodarone.  He follows with cardiologist periodically, Dr. Herbie Mckinney, last visit on 04/04/2018.  Echo on 05/31/2017: LVEF 30 to 35% with diffuse hypokinesis. Pharmacologic stress test in 05/2017:  1. No evidence of reversible myocardial ischemia. Large infarct involving the anterior, apical, and septal walls.  2. Diffuse left ventricular hypokinesis with moderate left ventricular dilatation.  3. Left ventricular ejection fraction 27%.   He sleeps on his recliner, orthopnea and PND.   He is not consistent with a low-salt diet or a healthy diet due to financial issues.  He usually buys his food from the [employer], pre-made/frozen meals and meals from church. He lives alone. 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   SOB: He uses Albuterol neb at night as needed, about 1-2 times per months; it helps. No known Hx of COPD or asthma. He also has Albuterol inh that he uses bid. OSA, he refused to have sleep study done because could not afford CPAP.  HTN:  Currently he is on Coreg 6.25 mg bid,Aldactone 25 mg daily,and Lisinopril 5 mg daily. He is also on Furosemide 80 mg bid. CKD III, he has not noted foam in urine,gross hematuria,or decreased urine output.   Hyperlipidemia:  Currently on Pravastatin 40 mg daily. Following a low fat diet: Not consistently.  He has not noted side effects with medication.  Lab Results  Component Value Date   CHOL 191 05/30/2017   HDL 43 05/30/2017   LDLCALC 99 05/30/2017   LDLDIRECT 164.8 09/12/2006   TRIG 247 (H) 05/30/2017   CHOLHDL 4.4 05/30/2017      Review of Systems    Constitutional: Positive for fatigue. Negative for activity change, appetite change and fever.  HENT: Negative for nosebleeds, sore throat and trouble swallowing.   Eyes: Negative for redness and visual disturbance.  Respiratory: Positive for cough, shortness of breath and wheezing.   Cardiovascular: Positive for leg swelling. Negative for chest pain and palpitations.  Gastrointestinal: Negative for abdominal pain, nausea and vomiting.  Endocrine: Negative for polydipsia, polyphagia and polyuria.  Genitourinary: Negative for decreased urine volume, dysuria and hematuria.  Musculoskeletal: Positive for arthralgias and gait problem.  Skin: Negative for rash and wound.  Neurological: Negative for syncope, weakness and headaches.      Current Outpatient Medications on File Prior to Visit  Medication Sig Dispense Refill  . albuterol (PROAIR HFA) 108 (90 Base) MCG/ACT inhaler INHALE 1 PUFF INTO THE LUNGS EVERY 6 HOURS AS NEEDED FOR WHEEZING OR SHORTNESS OF BREATH 8.5 g 3  . albuterol (PROVENTIL) (2.5 MG/3ML) 0.083% nebulizer solution Take 3 mLs (2.5 mg total) by nebulization every 6 (six) hours as needed for wheezing or shortness of breath. 150 mL 1  . allopurinol (ZYLOPRIM) 100 MG tablet Take 2 tablets (200 mg total) by mouth daily. 180 tablet 3  . amiodarone (PACERONE) 200 MG tablet TAKE 1 TABLET(200 MG) BY MOUTH DAILY 90 tablet 2  . apixaban (ELIQUIS) 2.5 MG TABS tablet Take 1 tablet (2.5 mg total) by mouth 2 (two) times daily. 60 tablet 3  . carvedilol (COREG) 6.25 MG tablet TAKE 1 TABLET(6.25 MG) BY MOUTH TWICE DAILY WITH A MEAL 180 tablet  0  . Cholecalciferol (VITAMIN D PO) Take 1 tablet by mouth daily.    . colchicine 0.6 MG tablet TAKE 1 TABLET(0.6 MG) BY MOUTH TWICE DAILY 180 tablet 0  . DIGOX 125 MCG tablet TK 1 T PO QD  1  . fluticasone (FLONASE) 50 MCG/ACT nasal spray Place 1 spray into both nostrils daily.    . furosemide (LASIX) 80 MG tablet Take 80 mg by mouth 2 (two) times  daily.    Marland Kitchen lisinopril (PRINIVIL,ZESTRIL) 5 MG tablet Take 1 tablet (5 mg total) by mouth daily. 90 tablet 3  . nitroGLYCERIN (NITROSTAT) 0.4 MG SL tablet Place 0.4 mg under the tongue every 5 (five) minutes as needed for chest pain.    . potassium chloride SA (K-DUR,KLOR-CON) 20 MEQ tablet Take 1 tablet (20 mEq total) by mouth 2 (two) times daily. 180 tablet 2  . spironolactone (ALDACTONE) 25 MG tablet Take 0.5 tablets (12.5 mg total) by mouth daily. 45 tablet 3  . pravastatin (PRAVACHOL) 40 MG tablet TAKE 1 TABLET(40 MG) BY MOUTH DAILY 90 tablet 1   No current facility-administered medications on file prior to visit.      Past Medical History:  Diagnosis Date  . Anxiety   . 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 --> CABG x 4: LIMA-LAD, SVG-Diag, SeqSVG-rPL-rPDA --> Intra-Op TEE showed EF of 30% with only mild MR.   . 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  Suggestion of anterior infarct on Myoview/echo.  Marland Kitchen GERD (gastroesophageal reflux disease)   . Hyperlipidemia   . Hypertension   . Hypertensive heart disease    Mild to moderately dilated LV with mild LVH.  . Ischemic cardiomyopathy 01/2014   EF 30-35%.  Severe multivessel CAD noted on cath--CABG x4 -> Myoview (June 2018) shows large anterior-septal-apical infarct with no ischemia.  . Kidney stones   . Obesity (BMI 30-39.9)   . Osteoarthritis   . Permanent atrial fibrillation (HCC)    On Amiodarone & Beta Blocker (on Xarelto)  . PONV (postoperative nausea and vomiting)    Allergies  Allergen Reactions  . Niacin And Related Other (See Comments)    FLUSHING  . Xarelto [Rivaroxaban] Other (See Comments)    Cause bloody stools  . Penicillins Itching, Rash and Other (See Comments)    Has  patient had a PCN reaction causing immediate rash, facial/tongue/throat swelling, SOB or lightheadedness with hypotension: Yes Has patient had a PCN reaction causing severe rash involving mucus membranes or skin necrosis: No Has patient had a PCN reaction that required hospitalization: No Has patient had a PCN reaction occurring within the last 10 years: No If all of the above answers are "NO", then may proceed with Cephalosporin use.     Family History  Problem Relation Age of Onset  . Diabetes Mother   . Heart disease Father   . Congestive Heart Failure Sister   . Stroke Brother   . Heart disease Brother   . Diabetes Brother     Social History   Socioeconomic History  . Marital status: Widowed    Spouse name: Not on file  . Number of children: Not on file  . Years of education: Not on file  . Highest  education level: Not on file  Occupational History  . Occupation: disabled  Social Needs  . Financial resource strain: Not on file  . Food insecurity:    Worry: Not on file    Inability: Not on file  . Transportation needs:    Medical: Not on file    Non-medical: Not on file  Tobacco Use  . Smoking status: Former Smoker    Last attempt to quit: 2000    Years since quitting: 19.5  . Smokeless tobacco: Never Used  Substance and Sexual Activity  . Alcohol use: No  . Drug use: No  . Sexual activity: Yes  Lifestyle  . Physical activity:    Days per week: Not on file    Minutes per session: Not on file  . Stress: Not on file  Relationships  . Social connections:    Talks on phone: Not on file    Gets together: Not on file    Attends religious service: Not on file    Active member of club or organization: Not on file    Attends meetings of clubs or organizations: Not on file    Relationship status: Not on file  Other Topics Concern  . Not on file  Social History Narrative  . Not on file    Vitals:   06/13/18 1112  BP: 130/86  Pulse: 67  Resp: 16  Temp: 97.8  F (36.6 C)  SpO2: 96%    Body mass index is 46.72 kg/m.   Physical Exam  Nursing note and vitals reviewed. Constitutional: He is oriented to person, place, and time. He appears well-developed. No distress.  HENT:  Head: Normocephalic and atraumatic.  Mouth/Throat: Oropharynx is clear and moist and mucous membranes are normal.  Eyes: Pupils are equal, round, and reactive to light. Conjunctivae are normal.  Cardiovascular: Normal rate. An irregular rhythm present.  No murmur heard. PT pulses present, bilateral.  Respiratory: Effort normal and breath sounds normal. No respiratory distress.  GI: Soft. He exhibits no mass. There is no tenderness.  Abdomen obese.  Musculoskeletal: He exhibits edema (Trace pitting edema LE, bilateral.). He exhibits no tenderness.  Lymphadenopathy:    He has no cervical adenopathy.  Neurological: He is alert and oriented to person, place, and time. He has normal strength. Gait normal.  Skin: Skin is warm. No erythema.  Psychiatric: He has a normal mood and affect.  Fairly groomed, good eye contact.      ASSESSMENT AND PLAN:   Mr. Olive was seen today for transfer care.  Diagnoses and all orders for this visit:  Lab Results  Component Value Date   CREATININE 1.17 06/13/2018   BUN 20 06/13/2018   NA 141 06/13/2018   K 4.1 06/13/2018   CL 104 06/13/2018   CO2 29 06/13/2018   Lab Results  Component Value Date   MICROALBUR 5.3 (H) 06/13/2018   Lab Results  Component Value Date   HGBA1C 6.4 06/13/2018    CKD (chronic kidney disease), stage III (HCC)  Continue Lisinopril. Adequate hydration. Low salt diet and avoidance of NSAID's.  -     Basic metabolic panel -     VITAMIN D 25 Hydroxy (Vit-D Deficiency, Fractures) -     Microalbumin / creatinine urine ratio  Essential hypertension  Otherwise adequately controlled. No changes in current management. Low salt diet recommended. Eye exam at least annually. F/U in 4 months,  before if needed.  -     Basic metabolic panel  Chronic combined systolic and diastolic CHF, NYHA class 3 (HCC)  He is not compliant with low salt diet. No changes in current management. Continue following with cardiologist.  Hyperglycemia  05/29/17 HgA1C 6.0 and glucose 145 in 01/2018. Educated about importance of a healthy life style for primary prevention of diabetes. -     Hemoglobin A1c  Idiopathic chronic gout without tophus, unspecified site  Asymptomatic. On Furosemide. No changes in current management. Low purines diet recommended.  -     Uric acid  Encounter for HCV screening test for high risk patient -     Hepatitis C antibody  Dyspnea, unspecified type  ? Deconditioning,COPD,HF among some possible causes. Albuterol helps,caution with Albuterol neb due to Hx of atrial fib,CAD,and HF. I prefer Albuterol inh. He agrees with adding Advair and monitor for changes in symptoms and/or frequency of Albuterol use. F/U in 4 months.  -     Fluticasone-Salmeterol (ADVAIR DISKUS) 250-50 MCG/DOSE AEPB; Inhale 1 puff into the lungs 2 (two) times daily.    He is not fasting today.    Betty G. [place], MD  [facility]. [place] office.

## 2018-06-14 LAB — HEPATITIS C ANTIBODY
Hepatitis C Ab: NONREACTIVE
SIGNAL TO CUT-OFF: 0.08 (ref <1.00)

## 2018-06-14 NOTE — Telephone Encounter (Signed)
Patient checking status. Please advise. Call back [phone]

## 2018-06-15 NOTE — Telephone Encounter (Signed)
Please advise 

## 2018-06-18 ENCOUNTER — Other Ambulatory Visit: Payer: Self-pay | Admitting: *Deleted

## 2018-06-18 MED ORDER — VITAMIN D (ERGOCALCIFEROL) 1.25 MG (50000 UNIT) PO CAPS: 50000.0000 [IU] | ORAL_CAPSULE | ORAL | 0 refills | Status: AC

## 2018-06-19 ENCOUNTER — Other Ambulatory Visit: Payer: Self-pay | Admitting: Internal Medicine

## 2018-06-20 DIAGNOSIS — H353221 Exudative age-related macular degeneration, left eye, with active choroidal neovascularization: Secondary | ICD-10-CM | POA: Diagnosis not present

## 2018-06-28 ENCOUNTER — Other Ambulatory Visit: Payer: Self-pay | Admitting: Internal Medicine

## 2018-06-28 DIAGNOSIS — I5022 Chronic systolic (congestive) heart failure: Secondary | ICD-10-CM

## 2018-07-03 ENCOUNTER — Ambulatory Visit: Payer: Medicare Other | Admitting: Physician Assistant

## 2018-07-03 ENCOUNTER — Encounter: Payer: Self-pay | Admitting: Physician Assistant

## 2018-07-03 VITALS — BP 151/89 | HR 130 | Ht 70.5 in | Wt 331.0 lb

## 2018-07-03 DIAGNOSIS — I482 Chronic atrial fibrillation, unspecified: Secondary | ICD-10-CM

## 2018-07-03 DIAGNOSIS — I5042 Chronic combined systolic (congestive) and diastolic (congestive) heart failure: Secondary | ICD-10-CM | POA: Diagnosis not present

## 2018-07-03 DIAGNOSIS — I25810 Atherosclerosis of coronary artery bypass graft(s) without angina pectoris: Secondary | ICD-10-CM

## 2018-07-03 DIAGNOSIS — I10 Essential (primary) hypertension: Secondary | ICD-10-CM | POA: Diagnosis not present

## 2018-07-03 DIAGNOSIS — I255 Ischemic cardiomyopathy: Secondary | ICD-10-CM | POA: Diagnosis not present

## 2018-07-03 DIAGNOSIS — E785 Hyperlipidemia, unspecified: Secondary | ICD-10-CM

## 2018-07-03 MED ORDER — CARVEDILOL 12.5 MG PO TABS: 12.5000 mg | ORAL_TABLET | Freq: Two times a day (BID) | ORAL | 2 refills | Status: DC

## 2018-07-03 MED ORDER — SACUBITRIL-VALSARTAN 24-26 MG PO TABS: 1.0000 | ORAL_TABLET | Freq: Two times a day (BID) | ORAL | 0 refills | Status: DC

## 2018-07-03 NOTE — Progress Notes (Signed)
Cardiology Office Note    Date:  07/03/2018   ID:  Charles Mckinney, DOB [birth year], MRN [id number]  PCP:  [place], Betty G, MD  Cardiologist:  Dr. [name]   Chief Complaint  Patient presents with  . Follow-up    seen for Dr. [name], initiation of Entresto    History of Present Illness:  Charles Mckinney is a 67 y.o. male with PMH of CAD s/p CABG, ischemic cardiomyopathy with baseline EF 30 to 35%, atrial fibrillation, obesity, hypertension, and hyperlipidemia.  Patient was previously followed by [facility] and was recently established with Dr. [name] in February 2019.  He had a normal stress test was EF in the 50s in 2014.  He later was admitted in February 2015 with NSTEMI and acute heart failure with new EF of 30 to 35% and ended here hypokinesis.  He was lost to follow-up until January 2016 when he was admitted with atrial fibrillation with RVR, he left AMA initially but later returned in February 2016 and admitted again for atrial fibrillation with RVR and a CHF.  He eventually underwent DC cardioversion.  He underwent repeat cardioversion in April 2016.  He was readmitted in May 2016 and underwent cardiac catheterization revealing multivessel disease.  He eventually underwent CABG x4 with LIMA to LAD, SVG to diagonal, sequential SVG to RPL and RPDA.  Intraoperative TEE showed EF 30% with mild MR.  He was admitted with TIA in May 2018.  Later Xarelto was stopped for eye bleed by his ophthalmologist we will continue him on amiodarone and a beta-blocker.  Most recent admission was in June 2018, he was admitted for chest pain, Myoview was negative for ischemia.  Patient was most recently seen by Dr. [name] in May 2019.  He was on low-dose Eliquis at the time and he remained in atrial fibrillation.  Heart rate better controlled after restarting digoxin.  During the recent visit, he was started on higher dose of lisinopril and had spironolactone added for consideration of switch to [facility] on  follow-up.  Patient presents today for cardiology office visit.  His weight is quite stable at this time.  He continued to have bilateral lower extremity edema which is chronic for him.  I will continue him on the current dose of diuretic.  His initial heart rate was in the 130s, however after he sat down, his heart rate came to back down to 112.  The elevation of the heart rate it is likely related to deconditioning and morbid obesity.  I will increase his carvedilol to 12.5 mg twice daily.  His blood pressure is elevated today, apparently he never started on the 5 mg lisinopril, he remained on the 2.5 mg lisinopril.  His last dose was this morning.  I will start him on the Entresto 24-26 mg twice daily.  He will need a 36-hour washout.  After the last dose of lisinopril this morning, he was started on the [facility] tomorrow afternoon.  Otherwise he will need 1 week basic metabolic panel to assess renal function and electrolyte.  I will see him in 2 weeks for further up titration of Entresto.  Past Medical History:  Diagnosis Date  . Anxiety   . 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 --> CABG x 4: LIMA-LAD, SVG-Diag, SeqSVG-rPL-rPDA --> Intra-Op TEE showed EF  of 30% with only mild MR.   . 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  Suggestion of anterior infarct on Myoview/echo.  Marland Kitchen GERD (gastroesophageal reflux disease)   . Hyperlipidemia   . Hypertension   . Hypertensive heart disease    Mild to moderately dilated LV with mild LVH.  . Ischemic cardiomyopathy 01/2014   EF 30-35%.  Severe multivessel CAD noted on cath--CABG x4 -> Myoview (June 2018) shows large anterior-septal-apical infarct with no ischemia.  . Kidney stones   . Obesity (BMI 30-39.9)   .  Osteoarthritis   . Permanent atrial fibrillation (HCC)    On Amiodarone & Beta Blocker (on Xarelto)  . PONV (postoperative nausea and vomiting)     Past Surgical History:  Procedure Laterality Date  . CARDIAC CATHETERIZATION N/A 04/09/2015   Procedure: Right/Left Heart Cath and Coronary Angiography;  Surgeon: Orpah Cobb, MD;  Location: [facility] INVASIVE CV LAB CUPID::  LM 95%; ostLAD 99% - p-dLAD 80%, D1 99% - D2 80%; OCx 90%,pCx 50%; pRCA 80%, mRCA 50%, rPDA 80% & 90%.  . CARDIOVERSION N/A 01/19/2015   Procedure: CARDIOVERSION;  Surgeon: Othella Boyer, MD;  Location: [facility] ENDOSCOPY;  Service: Cardiovascular;  Laterality: N/A;  pt in Afib, 12:22 synched cardioversion @  120 joules using Propofol 100 mg,IV....unsuccessful, repeated at 200 joules  . CARDIOVERSION N/A 04/02/2015   Procedure: CARDIOVERSION;  Surgeon: Orpah Cobb, MD;  Location: [facility] ENDOSCOPY;  Service: Cardiovascular;  Laterality: N/A;  . CATARACT EXTRACTION    . CLIPPING OF ATRIAL APPENDAGE  04/13/2015   Procedure: CLIPPING OF ATRIAL APPENDAGE;  Surgeon: Alleen Borne, MD;  Location: [facility] OR;  Service: Open Heart Surgery;;  . CORONARY ARTERY BYPASS GRAFT N/A 04/13/2015   Procedure: CORONARY ARTERY BYPASS GRAFTING (CABG);  Surgeon: Alleen Borne, MD;  Location: [facility] OR;  Service: Open Heart Surgery;; LIMA-LAD, SVG-Diag, SeqSVG-rPL-rPDA   . NM MYOVIEW LTD  05/2017   EF 27%.  No reversible ischemia.  Large anterior-apical and septal infarct.  Diffuse LV HK and moderate LV dilation.  . TEE WITHOUT CARDIOVERSION N/A 04/13/2015   Procedure: TRANSESOPHAGEAL ECHOCARDIOGRAM (TEE);  Surgeon: Alleen Borne, MD;  Location: [facility] OR;  Service: Open Heart Surgery;  Laterality: N/A;  . TRANSTHORACIC ECHOCARDIOGRAM  2/'15, 2/'16, 4/'16   a) EF 30-35%.  Mild LVH.  GRII DD.  Mild LA dilation.  Moderate diastolic dysfunction. b) Technically difficult.  Mild LVH.  EF 35%.  Diffuse HK.  Moderate LA dilation.  Mildly dilated RV with mildly reduced function.  c)    Mild concentric LVH.  EF 30-35%, diffuse HK.  Severe HK of mid apical inferior wall.  Moderate LA dilation.  . TRANSTHORACIC ECHOCARDIOGRAM N/A 1/18, 5/18, 7/18   a) Moderate severely reduced LV.  EF 30 of 35% with diffuse HK.  Mild MR.  Mildly dilated RA.;; b) (In setting of TIA).  Technically difficult mild to moderate globally reduced LV function.  Mild LVH.  Mild MR.  EF estimated 40-45%.  Moderate LA dilation.;; c)  Afib. Mild to mod dilated LV with mild LVH. EF 30-35% with diffuse HK. Mildly dilated RV with mildly decrease Systolic Fxn. Mild MR    Current Medications: Outpatient Medications Prior to Visit  Medication Sig Dispense Refill  . albuterol (PROAIR HFA) 108 (90 Base) MCG/ACT inhaler INHALE 1 PUFF INTO THE LUNGS EVERY 6 HOURS AS NEEDED FOR WHEEZING OR SHORTNESS OF  BREATH 8.5 g 3  . albuterol (PROVENTIL) (2.5 MG/3ML) 0.083% nebulizer solution USE ONE VIAL USING NEBULIZER EVERY 6 (SIX) HOURS AS NEEDED FOR WHEEZING OR SHORTNESS OF BREATH. 150 mL 1  . allopurinol (ZYLOPRIM) 100 MG tablet Take 2 tablets (200 mg total) by mouth daily. 180 tablet 3  . amiodarone (PACERONE) 200 MG tablet TAKE 1 TABLET(200 MG) BY MOUTH DAILY 90 tablet 2  . apixaban (ELIQUIS) 2.5 MG TABS tablet Take 1 tablet (2.5 mg total) by mouth 2 (two) times daily. 60 tablet 3  . Cholecalciferol (VITAMIN D PO) Take 1 tablet by mouth daily.    . colchicine 0.6 MG tablet TAKE 1 TABLET(0.6 MG) BY MOUTH TWICE DAILY 180 tablet 0  . DIGOX 125 MCG tablet TK 1 T PO QD  1  . fluticasone (FLONASE) 50 MCG/ACT nasal spray Place 1 spray into both nostrils daily.    . Fluticasone-Salmeterol (ADVAIR DISKUS) 250-50 MCG/DOSE AEPB Inhale 1 puff into the lungs 2 (two) times daily. 1 each 3  . furosemide (LASIX) 80 MG tablet Take 80 mg by mouth 2 (two) times daily.    . nitroGLYCERIN (NITROSTAT) 0.4 MG SL tablet Place 0.4 mg under the tongue every 5 (five) minutes as needed for chest pain.    . potassium chloride SA (K-DUR,KLOR-CON) 20 MEQ  tablet Take 1 tablet (20 mEq total) by mouth 2 (two) times daily. 180 tablet 2  . pravastatin (PRAVACHOL) 40 MG tablet TAKE 1 TABLET(40 MG) BY MOUTH DAILY 90 tablet 1  . spironolactone (ALDACTONE) 25 MG tablet Take 0.5 tablets (12.5 mg total) by mouth daily. 45 tablet 3  . Vitamin D, Ergocalciferol, (DRISDOL) 50000 units CAPS capsule Take 1 capsule (50,000 Units total) by mouth every 7 (seven) days. 10 capsule 0  . carvedilol (COREG) 6.25 MG tablet TAKE 1 TABLET(6.25 MG) BY MOUTH TWICE DAILY WITH A MEAL 180 tablet 0  . lisinopril (PRINIVIL,ZESTRIL) 5 MG tablet Take 1 tablet (5 mg total) by mouth daily. 90 tablet 3   No facility-administered medications prior to visit.      Allergies:   Niacin and related; Xarelto [rivaroxaban]; and Penicillins   Social History   Socioeconomic History  . Marital status: Widowed    Spouse name: Not on file  . Number of children: Not on file  . Years of education: Not on file  . Highest education level: Not on file  Occupational History  . Occupation: disabled  Social Needs  . Financial resource strain: Not on file  . Food insecurity:    Worry: Not on file    Inability: Not on file  . Transportation needs:    Medical: Not on file    Non-medical: Not on file  Tobacco Use  . Smoking status: Former Smoker    Last attempt to quit: 2000    Years since quitting: 19.5  . Smokeless tobacco: Never Used  Substance and Sexual Activity  . Alcohol use: No  . Drug use: No  . Sexual activity: Yes  Lifestyle  . Physical activity:    Days per week: Not on file    Minutes per session: Not on file  . Stress: Not on file  Relationships  . Social connections:    Talks on phone: Not on file    Gets together: Not on file    Attends religious service: Not on file    Active member of club or organization: Not on file    Attends meetings of clubs or organizations: Not on  file    Relationship status: Not on file  Other Topics Concern  . Not on file  Social  History Narrative  . Not on file     Family History:  The patient's family history includes Congestive Heart Failure in his sister; Diabetes in his brother and mother; Heart disease in his brother and father; Stroke in his brother.   ROS:   Please see the history of present illness.    ROS All other systems reviewed and are negative.   PHYSICAL EXAM:   VS:  BP (!) 151/89   Pulse (!) 130   Ht 5' 10.5" (1.791 m)   Wt (!) 331 lb (150.1 kg)   BMI 46.82 kg/m    GEN: Well nourished, well developed, in no acute distress  HEENT: normal  Neck: no JVD, carotid bruits, or masses Cardiac: Irregularly irregular; no murmurs, rubs, or gallops. 1-2+ edema  Respiratory:  clear to auscultation bilaterally, normal work of breathing GI: soft, nontender, nondistended, + BS MS: no deformity or atrophy  Skin: warm and dry, no rash Neuro:  Alert and Oriented x 3, Strength and sensation are intact Psych: euthymic mood, full affect  Wt Readings from Last 3 Encounters:  07/03/18 (!) 331 lb (150.1 kg)  06/13/18 (!) 330 lb 4 oz (149.8 kg)  04/04/18 (!) 330 lb (149.7 kg)      Studies/Labs Reviewed:   EKG:  EKG is ordered today.  The ekg ordered today demonstrates atrial fibrillation with RVR, heart rate 112  Recent Labs: 01/23/2018: Pro B Natriuretic peptide (BNP) 411.0 06/13/2018: BUN 20; Creatinine, Ser 1.17; Potassium 4.1; Sodium 141   Lipid Panel    Component Value Date/Time   CHOL 191 05/30/2017 0510   TRIG 247 (H) 05/30/2017 0510   TRIG 243 (HH) 09/12/2006 1309   HDL 43 05/30/2017 0510   CHOLHDL 4.4 05/30/2017 0510   VLDL 49 (H) 05/30/2017 0510   LDLCALC 99 05/30/2017 0510   LDLDIRECT 164.8 09/12/2006 1309    Additional studies/ records that were reviewed today include:   Cath 05/31/2017 LV EF: 30% -   35% Study Conclusions  - Left ventricle: The cavity size was mildly to moderately dilated.   Wall thickness was increased in a pattern of mild LVH.   Indeterminant diastolic  function (atrial fibrillation). Systolic   function was moderately to severely reduced. The estimated   ejection fraction was in the range of 30% to 35%. Diffuse   hypokinesis. - Aortic valve: There was no stenosis. - Mitral valve: Mildly calcified annulus. Normal thickness leaflets   . There was mild regurgitation. - Left atrium: The atrium was mildly dilated. - Right ventricle: The cavity size was mildly dilated. Systolic   function was mildly reduced. - Right atrium: The atrium was mildly dilated. - Tricuspid valve: Peak RV-RA gradient (S): 20 mm Hg. - Pulmonary arteries: PA peak pressure: 23 mm Hg (S). - Inferior vena cava: The vessel was normal in size. The   respirophasic diameter changes were in the normal range (>= 50%),   consistent with normal central venous pressure. - Pericardium, extracardiac: A trivial pericardial effusion was   identified posterior to the heart.  Impressions:  - The patient was in atrial fibrillation. Mild to moderately   dilated LV with mild LV hypertrophy. EF 30-35% with diffuse   hypokinesis. Mildly dilated RV with mildly decreased systolic   function. Mild mitral regurgitation.   ASSESSMENT:    1. Chronic combined systolic and diastolic CHF, NYHA class  3 (HCC)   2. Coronary artery disease involving coronary bypass graft of native heart without angina pectoris   3. Ischemic cardiomyopathy   4. Chronic atrial fibrillation (HCC)   5. Essential hypertension   6. Hyperlipidemia, unspecified hyperlipidemia type      PLAN:  In order of problems listed above:  1. Chronic combined systolic and diastolic heart failure: Despite bilateral lower extremity edema, patient is currently at his baseline.  Continue sodium and fluid restriction.  His weight has been unchanged  2. Ischemic cardiomyopathy: baseline EF 30 to 35% on last echocardiogram 05/31/2017.  Continue carvedilol, digoxin and spironolactone.  Will transition his lisinopril to Entresto 24-  26 mg twice daily.  I did not transition to the moderate dose of Entresto as I had to increase his carvedilol for better rate control.  He will need 1 week basic metabolic panel and follow-up in 2 weeks for further up titration of medication.  Once his heart failure medication has been fully titrated, will need to consider 3 months with repeat echocardiogram.  3. Chronic atrial fibrillation: Patient has been persistently in atrial fibrillation for a long time.  He is currently taking Eliquis 2.5 mg twice daily, ideally he should be on 5 mg twice daily of Eliquis, however patient refused on the ground at that he had history of significant GI bleed and bleeding in the eye as well.  He does not wish to consider higher dose of Eliquis.  His heart rate is not very well controlled, will increase carvedilol to 12.5 mg twice daily.  This is likely related to his morbid obesity and deconditioning, his heart rate increases quite significantly with activity and slowly decrease back down to normal range with resting.  4. CAD s/p CABG: No recent anginal symptom.  Not on aspirin given the need for Eliquis.  Continue Pravachol  5. Hypertension: Blood pressure elevated today, increase carvedilol for better rate control  6. Hyperlipidemia: On Pravachol.  He is due for repeat lipid panel.  7. Morbid obesity: Likely has obesity hypoventilation syndrome.  Will need to consider sleep study at some point to rule out OSA.    Medication Adjustments/Labs and Tests Ordered: Current medicines are reviewed at length with the patient today.  Concerns regarding medicines are outlined above.  Medication changes, Labs and Tests ordered today are listed in the Patient Instructions below. Patient Instructions  Medication Instructions:  START Entresto 24/26mg  take 1 tablet twice a day-START THIS MEDICATION TOMORROW AFTERNOON  DISCONTINUE Lisinopril INCREASE Coreg to 12.5mg  Take 1 tablet twice a day   Labwork: Your physician  recommends that you return for lab work in: 1 wk BMET (07/10/2018)  Testing/Procedures: None   Follow-Up: Your physician recommends that you schedule a follow-up appointment in: 2 week with Azalee Course, PA-C  Any Other Special Instructions Will Be Listed Below (If Applicable). If you need a refill on your cardiac medications before your next appointment, please call your pharmacy.     Ramond Dial, [place]  07/03/2018 1:38 PM    [facility] [address] [place], [place], [place]  [address] Phone: [phone]; Fax: [phone]

## 2018-07-03 NOTE — Patient Instructions (Addendum)
Medication Instructions:  START Entresto 24/26mg  take 1 tablet twice a day-START THIS MEDICATION TOMORROW AFTERNOON  DISCONTINUE Lisinopril INCREASE Coreg to 12.5mg  Take 1 tablet twice a day   Labwork: Your physician recommends that you return for lab work in: 1 wk BMET (07/10/2018)  Testing/Procedures: None   Follow-Up: Your physician recommends that you schedule a follow-up appointment in: 2 week with Azalee Course, PA-C  Any Other Special Instructions Will Be Listed Below (If Applicable). If you need a refill on your cardiac medications before your next appointment, please call your pharmacy.

## 2018-07-05 ENCOUNTER — Other Ambulatory Visit: Payer: Self-pay | Admitting: Family Medicine

## 2018-07-05 NOTE — Telephone Encounter (Signed)
Copied from CRM [id number]. Topic: Quick Communication - Rx Refill/Question >> Jul 05, 2018 10:29 AM Mickel Baas B, NT wrote: Medication: furosemide (LASIX) 80 MG tablet  Has the patient contacted their pharmacy? Yes.   (Agent: If no, request that the patient contact the pharmacy for the refill.) (Agent: If yes, when and what did the pharmacy advise?)  Preferred Pharmacy (with phone number or street name): [id number] - [place], [place] - [address] AT [address]  Agent: Please be advised that RX refills may take up to 3 business days. We ask that you follow-up with your pharmacy.

## 2018-07-05 NOTE — Telephone Encounter (Signed)
LOV 06/13/18 Dr. [place] No provider name associated with medication. [facility] [employer]. [place]

## 2018-07-07 ENCOUNTER — Other Ambulatory Visit: Payer: Self-pay | Admitting: Family Medicine

## 2018-07-09 ENCOUNTER — Other Ambulatory Visit: Payer: Self-pay | Admitting: Family Medicine

## 2018-07-09 MED ORDER — FUROSEMIDE 80 MG PO TABS: 80.0000 mg | ORAL_TABLET | Freq: Two times a day (BID) | ORAL | 1 refills | Status: DC

## 2018-07-17 ENCOUNTER — Telehealth: Payer: Self-pay | Admitting: Cardiology

## 2018-07-17 ENCOUNTER — Other Ambulatory Visit: Payer: Self-pay

## 2018-07-17 ENCOUNTER — Ambulatory Visit: Payer: Medicare Other | Admitting: Physician Assistant

## 2018-07-17 VITALS — BP 128/74 | HR 98 | Ht 70.0 in | Wt 333.0 lb

## 2018-07-17 DIAGNOSIS — I5042 Chronic combined systolic (congestive) and diastolic (congestive) heart failure: Secondary | ICD-10-CM

## 2018-07-17 DIAGNOSIS — I10 Essential (primary) hypertension: Secondary | ICD-10-CM | POA: Diagnosis not present

## 2018-07-17 DIAGNOSIS — I25810 Atherosclerosis of coronary artery bypass graft(s) without angina pectoris: Secondary | ICD-10-CM

## 2018-07-17 DIAGNOSIS — Z5181 Encounter for therapeutic drug level monitoring: Secondary | ICD-10-CM

## 2018-07-17 DIAGNOSIS — I482 Chronic atrial fibrillation, unspecified: Secondary | ICD-10-CM

## 2018-07-17 DIAGNOSIS — E785 Hyperlipidemia, unspecified: Secondary | ICD-10-CM

## 2018-07-17 MED ORDER — FUROSEMIDE 80 MG PO TABS: ORAL_TABLET | ORAL | 2 refills | Status: DC

## 2018-07-17 MED ORDER — SACUBITRIL-VALSARTAN 49-51 MG PO TABS: 1.0000 | ORAL_TABLET | Freq: Two times a day (BID) | ORAL | 5 refills | Status: DC

## 2018-07-17 MED ORDER — DIGOX 125 MCG PO TABS: 0.1250 mg | ORAL_TABLET | Freq: Every day | ORAL | 6 refills | Status: DC

## 2018-07-17 NOTE — Patient Instructions (Addendum)
Medication Instructions:   START TAKING ENTRESTO 49-51 TWICE A DAY   START TAKING LASIX 80 MG  ONCE A DAY   If you need a refill on your cardiac medications before your next appointment, please call your pharmacy.  Labwork:  BMET  TODAY    Testing/Procedures:IN NOVEMEBER  Your physician has requested that you have an echocardiogram. Echocardiography is a painless test that uses sound waves to create images of your heart. It provides your doctor with information about the size and shape of your heart and how well your heart's chambers and valves are working. This procedure takes approximately one hour. There are no restrictions for this procedure.    Follow-Up: AFTER ECHO WITH DR [facility]    Any Other Special Instructions Will Be Listed Below (If Applicable).

## 2018-07-17 NOTE — Progress Notes (Signed)
Cardiology Office Note    Date:  07/18/2018   ID:  Charles Mckinney, DOB 11/14/1951, MRN [id number]  PCP:  [place], Betty G, MD  Cardiologist:  Dr. [name]    Chief Complaint  Patient presents with  . Follow-up    seen for Dr. [name].     History of Present Illness:  Charles Mckinney is a 66 y.o. male with PMH of CAD s/p CABG, chronic atrial fibrillation, ischemic cardiomyopathy with baseline EF 30 to 35%, obesity, hypertension, and hyperlipidemia.  Patient was previously followed by Charles Mckinney and was recently established with Dr. [name] in February 2019.  He had a normal stress test with EF in the 50s in 2014.  He later was admitted in February 2015 with NSTEMI and acute heart failure with new EF of 30 to 35% and ended here hypokinesis.  He was lost to follow-up until January 2016 when he was admitted with atrial fibrillation with RVR, he left AMA initially but later returned in February 2016 and admitted again for atrial fibrillation with RVR and a CHF.  He eventually underwent DC cardioversion.  He underwent repeat cardioversion in April 2016.  He was readmitted in May 2016 and underwent cardiac catheterization revealing multivessel disease.  He eventually underwent CABG x4 with LIMA to LAD, SVG to diagonal, sequential SVG to RPL and RPDA.  Intraoperative TEE showed EF 30% with mild MR.  He was admitted with TIA in May 2018.  Later Xarelto was stopped for eye bleed by his ophthalmologist and continued on amiodarone and a beta-blocker.  Most recent admission was in June 2018, he was admitted for chest pain, Myoview was negative for ischemia.  When he saw Dr. [name] in May 2019 he, he remained in atrial fibrillation and on low-dose Eliquis.  Heart rate better controlled after starting digoxin.  He was also started on higher dose of lisinopril and spironolactone with consideration of switching to Charles Mckinney.  I started him on Entresto during the last office visit 36 hours after discontinuing the  lisinopril.  I also increased his carvedilol to 12.5 mg twice daily.  He presents today for up titration of Entresto.  He presents today for cardiology office visit.  Overall he has been doing very well, increased dose of carvedilol is controlling his heart rate.  He is also tolerating Entresto without any issue.  I will obtain a basic metabolic panel today.  Otherwise I will increase his Entresto to 49-51 mg twice daily.  He can follow-up with Dr. [name], prior to his follow-up he will need a repeat echocardiogram.   Past Medical History:  Diagnosis Date  . Anxiety   . 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 --> CABG x 4: LIMA-LAD, SVG-Diag, SeqSVG-rPL-rPDA --> Intra-Op TEE showed EF of 30% with only mild MR.   . 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  Suggestion of anterior infarct on Myoview/echo.  Marland Kitchen GERD (gastroesophageal reflux disease)   . Hyperlipidemia   . Hypertension   . Hypertensive heart disease    Mild to moderately dilated LV with mild LVH.  . Ischemic cardiomyopathy 01/2014   EF 30-35%.  Severe multivessel CAD noted on cath--CABG x4 -> Myoview (June 2018) shows large anterior-septal-apical infarct  with no ischemia.  . Kidney stones   . Obesity (BMI 30-39.9)   . Osteoarthritis   . Permanent atrial fibrillation (HCC)    On Amiodarone & Beta Blocker (on Xarelto)  . PONV (postoperative nausea and vomiting)     Past Surgical History:  Procedure Laterality Date  . CARDIAC CATHETERIZATION N/A 04/09/2015   Procedure: Right/Left Heart Cath and Coronary Angiography;  Surgeon: Orpah Cobb, MD;  Location: [facility] INVASIVE CV LAB CUPID::  LM 95%; ostLAD 99% - p-dLAD 80%, D1 99% - D2 80%; OCx 90%,pCx 50%; pRCA 80%, mRCA 50%, rPDA 80% & 90%.  .  CARDIOVERSION N/A 01/19/2015   Procedure: CARDIOVERSION;  Surgeon: Othella Boyer, MD;  Location: [facility] ENDOSCOPY;  Service: Cardiovascular;  Laterality: N/A;  pt in Afib, 12:22 synched cardioversion @  120 joules using Propofol 100 mg,IV....unsuccessful, repeated at 200 joules  . CARDIOVERSION N/A 04/02/2015   Procedure: CARDIOVERSION;  Surgeon: Orpah Cobb, MD;  Location: [facility] ENDOSCOPY;  Service: Cardiovascular;  Laterality: N/A;  . CATARACT EXTRACTION    . CLIPPING OF ATRIAL APPENDAGE  04/13/2015   Procedure: CLIPPING OF ATRIAL APPENDAGE;  Surgeon: Alleen Borne, MD;  Location: [facility] OR;  Service: Open Heart Surgery;;  . CORONARY ARTERY BYPASS GRAFT N/A 04/13/2015   Procedure: CORONARY ARTERY BYPASS GRAFTING (CABG);  Surgeon: Alleen Borne, MD;  Location: [facility] OR;  Service: Open Heart Surgery;; LIMA-LAD, SVG-Diag, SeqSVG-rPL-rPDA   . NM MYOVIEW LTD  05/2017   EF 27%.  No reversible ischemia.  Large anterior-apical and septal infarct.  Diffuse LV HK and moderate LV dilation.  . TEE WITHOUT CARDIOVERSION N/A 04/13/2015   Procedure: TRANSESOPHAGEAL ECHOCARDIOGRAM (TEE);  Surgeon: Alleen Borne, MD;  Location: [facility];  Service: Open Heart Surgery;  Laterality: N/A;  . TRANSTHORACIC ECHOCARDIOGRAM  2/'15, 2/'16, 4/'16   a) EF 30-35%.  Mild LVH.  GRII DD.  Mild LA dilation.  Moderate diastolic dysfunction. b) Technically difficult.  Mild LVH.  EF 35%.  Diffuse HK.  Moderate LA dilation.  Mildly dilated RV with mildly reduced function.  c)   Mild concentric LVH.  EF 30-35%, diffuse HK.  Severe HK of mid apical inferior wall.  Moderate LA dilation.  . TRANSTHORACIC ECHOCARDIOGRAM N/A 1/18, 5/18, 7/18   a) Moderate severely reduced LV.  EF 30 of 35% with diffuse HK.  Mild MR.  Mildly dilated RA.;; b) (In setting of TIA).  Technically difficult mild to moderate globally reduced LV function.  Mild LVH.  Mild MR.  EF estimated 40-45%.  Moderate LA dilation.;; c)  Afib. Mild to mod dilated LV with mild LVH. EF 30-35% with  diffuse HK. Mildly dilated RV with mildly decrease Systolic Fxn. Mild MR    Current Medications: Outpatient Medications Prior to Visit  Medication Sig Dispense Refill  . albuterol (PROAIR HFA) 108 (90 Base) MCG/ACT inhaler INHALE 1 PUFF INTO THE LUNGS EVERY 6 HOURS AS NEEDED FOR WHEEZING OR SHORTNESS OF BREATH 8.5 g 3  . albuterol (PROVENTIL) (2.5 MG/3ML) 0.083% nebulizer solution USE ONE VIAL USING NEBULIZER EVERY 6 (SIX) HOURS AS NEEDED FOR WHEEZING OR SHORTNESS OF BREATH. 150 mL 1  . allopurinol (ZYLOPRIM) 100 MG tablet Take 2 tablets (200 mg total) by mouth daily. 180 tablet 3  . amiodarone (PACERONE) 200 MG tablet TAKE 1 TABLET(200 MG) BY MOUTH DAILY 90 tablet 2  . apixaban (ELIQUIS) 2.5 MG TABS tablet Take 1 tablet (2.5 mg total) by mouth 2 (two) times daily. 60 tablet 3  . carvedilol (  COREG) 12.5 MG tablet Take 1 tablet (12.5 mg total) by mouth 2 (two) times daily with a meal. 60 tablet 2  . Cholecalciferol (VITAMIN D PO) Take 1 tablet by mouth daily.    . colchicine 0.6 MG tablet TAKE 1 TABLET(0.6 MG) BY MOUTH TWICE DAILY 180 tablet 0  . fluticasone (FLONASE) 50 MCG/ACT nasal spray Place 1 spray into both nostrils daily.    . Fluticasone-Salmeterol (ADVAIR DISKUS) 250-50 MCG/DOSE AEPB Inhale 1 puff into the lungs 2 (two) times daily. 1 each 3  . nitroGLYCERIN (NITROSTAT) 0.4 MG SL tablet Place 0.4 mg under the tongue every 5 (five) minutes as needed for chest pain.    . potassium chloride SA (K-DUR,KLOR-CON) 20 MEQ tablet Take 1 tablet (20 mEq total) by mouth 2 (two) times daily. 180 tablet 2  . pravastatin (PRAVACHOL) 40 MG tablet TAKE 1 TABLET(40 MG) BY MOUTH DAILY 90 tablet 1  . Vitamin D, Ergocalciferol, (DRISDOL) 50000 units CAPS capsule Take 1 capsule (50,000 Units total) by mouth every 7 (seven) days. 10 capsule 0  . DIGOX 125 MCG tablet TK 1 T PO QD  1  . furosemide (LASIX) 80 MG tablet TAKE 1 TABLET(80 MG) BY MOUTH TWICE DAILY 180 tablet 1  . sacubitril-valsartan (ENTRESTO)  24-26 MG Take 1 tablet by mouth 2 (two) times daily. 60 tablet 0  . spironolactone (ALDACTONE) 25 MG tablet Take 0.5 tablets (12.5 mg total) by mouth daily. 45 tablet 3   No facility-administered medications prior to visit.      Allergies:   Niacin and related; Xarelto [rivaroxaban]; and Penicillins   Social History   Socioeconomic History  . Marital status: Widowed    Spouse name: Not on file  . Number of children: Not on file  . Years of education: Not on file  . Highest education level: Not on file  Occupational History  . Occupation: disabled  Social Needs  . Financial resource strain: Not on file  . Food insecurity:    Worry: Not on file    Inability: Not on file  . Transportation needs:    Medical: Not on file    Non-medical: Not on file  Tobacco Use  . Smoking status: Former Smoker    Last attempt to quit: 2000    Years since quitting: 19.6  . Smokeless tobacco: Never Used  Substance and Sexual Activity  . Alcohol use: No  . Drug use: No  . Sexual activity: Yes  Lifestyle  . Physical activity:    Days per week: Not on file    Minutes per session: Not on file  . Stress: Not on file  Relationships  . Social connections:    Talks on phone: Not on file    Gets together: Not on file    Attends religious service: Not on file    Active member of club or organization: Not on file    Attends meetings of clubs or organizations: Not on file    Relationship status: Not on file  Other Topics Concern  . Not on file  Social History Narrative  . Not on file     Family History:  The patient's family history includes Congestive Heart Failure in his sister; Diabetes in his brother and mother; Heart disease in his brother and father; Stroke in his brother.   ROS:   Please see the history of present illness.    ROS All other systems reviewed and are negative.   PHYSICAL EXAM:   VS:  BP 128/74   Pulse 98   Ht 5\' 10"  (1.778 m)   Wt (!) 333 lb (151 kg)   BMI 47.78  kg/m    GEN: Well nourished, well developed, in no acute distress  HEENT: normal  Neck: no JVD, carotid bruits, or masses Cardiac: Irregularly irregular; no murmurs, rubs, or gallops,no edema  Respiratory:  clear to auscultation bilaterally, normal work of breathing GI: soft, nontender, nondistended, + BS MS: no deformity or atrophy  Skin: warm and dry, no rash Neuro:  Alert and Oriented x 3, Strength and sensation are intact Psych: euthymic mood, full affect  Wt Readings from Last 3 Encounters:  07/17/18 (!) 333 lb (151 kg)  07/03/18 (!) 331 lb (150.1 kg)  06/13/18 (!) 330 lb 4 oz (149.8 kg)      Studies/Labs Reviewed:   EKG:  EKG is not ordered today.    Recent Labs: 01/23/2018: Pro B Natriuretic peptide (BNP) 411.0 07/17/2018: BUN 27; Creatinine, Ser 1.39; Potassium 4.5; Sodium 144   Lipid Panel    Component Value Date/Time   CHOL 191 05/30/2017 0510   TRIG 247 (H) 05/30/2017 0510   TRIG 243 (HH) 09/12/2006 1309   HDL 43 05/30/2017 0510   CHOLHDL 4.4 05/30/2017 0510   VLDL 49 (H) 05/30/2017 0510   LDLCALC 99 05/30/2017 0510   LDLDIRECT 164.8 09/12/2006 1309    Additional studies/ records that were reviewed today include:   Echo 05/31/2017 LV EF: 30% -   35% Study Conclusions  - Left ventricle: The cavity size was mildly to moderately dilated.   Wall thickness was increased in a pattern of mild LVH.   Indeterminant diastolic function (atrial fibrillation). Systolic   function was moderately to severely reduced. The estimated   ejection fraction was in the range of 30% to 35%. Diffuse   hypokinesis. - Aortic valve: There was no stenosis. - Mitral valve: Mildly calcified annulus. Normal thickness leaflets   . There was mild regurgitation. - Left atrium: The atrium was mildly dilated. - Right ventricle: The cavity size was mildly dilated. Systolic   function was mildly reduced. - Right atrium: The atrium was mildly dilated. - Tricuspid valve: Peak RV-RA  gradient (S): 20 mm Hg. - Pulmonary arteries: PA peak pressure: 23 mm Hg (S). - Inferior vena cava: The vessel was normal in size. The   respirophasic diameter changes were in the normal range (>= 50%),   consistent with normal central venous pressure. - Pericardium, extracardiac: A trivial pericardial effusion was   identified posterior to the heart.  Impressions:  - The patient was in atrial fibrillation. Mild to moderately   dilated LV with mild LV hypertrophy. EF 30-35% with diffuse   hypokinesis. Mildly dilated RV with mildly decreased systolic   function. Mild mitral regurgitation.    ASSESSMENT:    1. Chronic atrial fibrillation (HCC)   2. Medication monitoring encounter   3. Chronic combined systolic and diastolic CHF, NYHA class 3 (HCC)   4. Coronary artery disease involving coronary bypass graft of native heart without angina pectoris   5. Essential hypertension   6. Hyperlipidemia, unspecified hyperlipidemia type      PLAN:  In order of problems listed above:  1. Chronic atrial fibrillation: Heart rate very well controlled after increasing carvedilol.  Continue amiodarone and Eliquis  2. Chronic combined systolic and diastolic heart failure: Appears to be euvolemic on current diuretic therapy.  We will continue to uptitrate Entresto therapy, increased to 49-51 mg twice daily.  Continue carvedilol  3. CAD s/p CABG: Denies any significant chest discomfort  4. Hypertension: Blood pressure very well controlled, increase Entresto for his LV dysfunction.  5. Hyperlipidemia: On Pravachol 40 mg daily.  He is due for his annual lipid panel.    Medication Adjustments/Labs and Tests Ordered: Current medicines are reviewed at length with the patient today.  Concerns regarding medicines are outlined above.  Medication changes, Labs and Tests ordered today are listed in the Patient Instructions below. Patient Instructions  Medication Instructions:   START TAKING  ENTRESTO 49-51 TWICE A DAY   START TAKING LASIX 80 MG  ONCE A DAY   If you need a refill on your cardiac medications before your next appointment, please call your pharmacy.  Labwork:  BMET  TODAY    Testing/Procedures:IN NOVEMEBER  Your physician has requested that you have an echocardiogram. Echocardiography is a painless test that uses sound waves to create images of your heart. It provides your doctor with information about the size and shape of your heart and how well your heart's chambers and valves are working. This procedure takes approximately one hour. There are no restrictions for this procedure.    Follow-Up: AFTER ECHO WITH DR [facility]    Any Other Special Instructions Will Be Listed Below (If Applicable).                                                                                                                                                             Ramond Dial, [place]  07/18/2018 9:47 AM    [facility] [address] [place], [place], [place]  [address] Phone: [phone]; Fax: [phone]

## 2018-07-17 NOTE — Telephone Encounter (Signed)
New Message          *STAT* If patient is at the pharmacy, call can be transferred to refill team.   1. Which medications need to be refilled? (please list name of each medication and dose if known) Enstreo  2. Which pharmacy/location (including street and city if local pharmacy) is medication to be sent to? [facility]  3. Do they need a 30 day or 90 day supply? 90

## 2018-07-18 ENCOUNTER — Encounter: Payer: Self-pay | Admitting: Physician Assistant

## 2018-07-18 LAB — BASIC METABOLIC PANEL
BUN/Creatinine Ratio: 19 (ref 10–24)
BUN: 27 mg/dL (ref 8–27)
CO2: 23 mmol/L (ref 20–29)
Calcium: 9.3 mg/dL (ref 8.6–10.2)
Chloride: 107 mmol/L — ABNORMAL HIGH (ref 96–106)
Creatinine, Ser: 1.39 mg/dL — ABNORMAL HIGH (ref 0.76–1.27)
GFR calc Af Amer: 60 mL/min/{1.73_m2} (ref >59)
GFR calc non Af Amer: 52 mL/min/{1.73_m2} — ABNORMAL LOW (ref >59)
GLUCOSE: 87 mg/dL (ref 65–99)
POTASSIUM: 4.5 mmol/L (ref 3.5–5.2)
SODIUM: 144 mmol/L (ref 134–144)

## 2018-07-19 ENCOUNTER — Telehealth: Payer: Self-pay | Admitting: Physician Assistant

## 2018-07-19 DIAGNOSIS — I5022 Chronic systolic (congestive) heart failure: Secondary | ICD-10-CM

## 2018-07-19 DIAGNOSIS — Z79899 Other long term (current) drug therapy: Secondary | ICD-10-CM

## 2018-07-19 NOTE — Telephone Encounter (Signed)
Called, LVM to call to back to discuss lab work.   Left call back number.

## 2018-07-19 NOTE — Telephone Encounter (Signed)
New Message ° ° ° °Patient is returning call in reference to lab results. Please call.  °

## 2018-07-20 NOTE — Telephone Encounter (Signed)
Called and LVM advising patient to return phone call.  Left call back number.

## 2018-07-24 MED ORDER — POTASSIUM CHLORIDE CRYS ER 20 MEQ PO TBCR: 20.0000 meq | EXTENDED_RELEASE_TABLET | Freq: Every day | ORAL | 1 refills | Status: DC

## 2018-07-24 MED ORDER — FUROSEMIDE 20 MG PO TABS: 60.0000 mg | ORAL_TABLET | Freq: Every day | ORAL | 1 refills | Status: DC

## 2018-07-24 NOTE — Telephone Encounter (Signed)
Notes recorded by Azalee Course, PA on 07/18/2018 at 5:28 PM EDT Renal function worsened slightly, I think this is related to the fact entresto has a little diuretic in it, therefore I would recommend reduce lasix to 60mg  daily from now on and also reduce the potassium to daily. Repeat BMET in 1 week.  Pt notified he will adjust tomorrow and return in a week for labs  Future labs and medication were sent to requested pharmacy

## 2018-07-25 DIAGNOSIS — H353221 Exudative age-related macular degeneration, left eye, with active choroidal neovascularization: Secondary | ICD-10-CM | POA: Diagnosis not present

## 2018-09-17 NOTE — Progress Notes (Signed)
Subjective:   Charles Mckinney is a 67 y.o. male who presents for Medicare Annual/Subsequent preventive examination.  Reports health as fair Seen by dr. [place] Buys food from [employer] Lives alone Goes [facility] to get free food and gets help  Disabled VET and DSS helped with heating   Seeing a counselor now   Not breathing good  Legs hurt  Has not happened since last week Sat he had diarrhea  Lasix 20 - 2 in the am and one in the evening Took 20 this am and will take 40 when he gets home   Diet Wt is 333lb - 138 today   Exercise Is daily ADLs  Health Maintenance Due  Topic Date Due  . COLON CANCER SCREENING ANNUAL FOBT  04/02/2018   Tetanus - postponed due finances No colonoscopy [facility] - [facility]  [facility]obt [facility]  Flu vaccine   Cardiac Risk Factors include: advanced age (>82men, >76 women);dyslipidemia;family history of premature cardiovascular disease;hypertension;male gender;obesity (BMI >30kg/m2);sedentary lifestyle     Objective:    Vitals: BP 140/80   Pulse 78   Ht 5\' 10"  (1.778 m)   Wt (!) 338 lb (153.3 kg)   SpO2 96%   BMI 48.50 kg/m   Body mass index is 48.5 kg/m.  Advanced Directives 09/18/2018 11/01/2017 06/02/2017 05/30/2017 05/29/2017 04/28/2017 04/25/2017  Does Patient Have a Medical Advance Directive? Yes No Yes No No No No  Type of Advance Directive - Web designer;Living will - - - -  Does patient want to make changes to medical advance directive? - - No - Patient declined - - - -  Copy of Healthcare Power of Attorney in Chart? - - Yes - - - -  Would patient like information on creating a medical advance directive? - No - Patient declined - Yes (Inpatient - patient requests chaplain consult to create a medical advance directive) - No - Patient declined No - Patient declined  Pre-existing out of facility DNR order (yellow form or pink MOST form) - - - - - - -   Has a stepson and son  His previous wife lives near  him   Tobacco Social History   Tobacco Use  Smoking Status Former Smoker  . Last attempt to quit: 2000  . Years since quitting: 19.8  Smokeless Tobacco Never Used     Counseling given: Not Answered   Clinical Intake:  Past Medical History:  Diagnosis Date  . Anxiety   . 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 --> CABG x 4: LIMA-LAD, SVG-Diag, SeqSVG-rPL-rPDA --> Intra-Op TEE showed EF of 30% with only mild MR.   . 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  Suggestion of anterior infarct on Myoview/echo.  Marland Kitchen GERD (gastroesophageal reflux disease)   . Hyperlipidemia   . Hypertension   . Hypertensive heart disease    Mild to moderately dilated LV with mild LVH.  . Ischemic cardiomyopathy 01/2014   EF 30-35%.  Severe multivessel CAD noted on cath--CABG x4 -> Myoview (June 2018) shows large anterior-septal-apical infarct with no ischemia.  . Kidney stones   . Obesity (BMI 30-39.9)   . Osteoarthritis   . Permanent atrial fibrillation (  HCC)    On Amiodarone & Beta Blocker (on Xarelto)  . PONV (postoperative nausea and vomiting)    Past Surgical History:  Procedure Laterality Date  . CARDIAC CATHETERIZATION N/A 04/09/2015   Procedure: Right/Left Heart Cath and Coronary Angiography;  Surgeon: Orpah Cobb, MD;  Location: [facility] INVASIVE CV LAB CUPID::  LM 95%; ostLAD 99% - p-dLAD 80%, D1 99% - D2 80%; OCx 90%,pCx 50%; pRCA 80%, mRCA 50%, rPDA 80% & 90%.  . CARDIOVERSION N/A 01/19/2015   Procedure: CARDIOVERSION;  Surgeon: Othella Boyer, MD;  Location: [facility] ENDOSCOPY;  Service: Cardiovascular;  Laterality: N/A;  pt in Afib, 12:22 synched cardioversion @  120 joules using Propofol 100 mg,IV....unsuccessful, repeated at 200 joules  . CARDIOVERSION N/A  04/02/2015   Procedure: CARDIOVERSION;  Surgeon: Orpah Cobb, MD;  Location: [facility] ENDOSCOPY;  Service: Cardiovascular;  Laterality: N/A;  . CATARACT EXTRACTION    . CLIPPING OF ATRIAL APPENDAGE  04/13/2015   Procedure: CLIPPING OF ATRIAL APPENDAGE;  Surgeon: Alleen Borne, MD;  Location: [facility] OR;  Service: Open Heart Surgery;;  . CORONARY ARTERY BYPASS GRAFT N/A 04/13/2015   Procedure: CORONARY ARTERY BYPASS GRAFTING (CABG);  Surgeon: Alleen Borne, MD;  Location: [facility] OR;  Service: Open Heart Surgery;; LIMA-LAD, SVG-Diag, SeqSVG-rPL-rPDA   . NM MYOVIEW LTD  05/2017   EF 27%.  No reversible ischemia.  Large anterior-apical and septal infarct.  Diffuse LV HK and moderate LV dilation.  . TEE WITHOUT CARDIOVERSION N/A 04/13/2015   Procedure: TRANSESOPHAGEAL ECHOCARDIOGRAM (TEE);  Surgeon: Alleen Borne, MD;  Location: [facility] OR;  Service: Open Heart Surgery;  Laterality: N/A;  . TRANSTHORACIC ECHOCARDIOGRAM  2/'15, 2/'16, 4/'16   a) EF 30-35%.  Mild LVH.  GRII DD.  Mild LA dilation.  Moderate diastolic dysfunction. b) Technically difficult.  Mild LVH.  EF 35%.  Diffuse HK.  Moderate LA dilation.  Mildly dilated RV with mildly reduced function.  c)   Mild concentric LVH.  EF 30-35%, diffuse HK.  Severe HK of mid apical inferior wall.  Moderate LA dilation.  . TRANSTHORACIC ECHOCARDIOGRAM N/A 1/18, 5/18, 7/18   a) Moderate severely reduced LV.  EF 30 of 35% with diffuse HK.  Mild MR.  Mildly dilated RA.;; b) (In setting of TIA).  Technically difficult mild to moderate globally reduced LV function.  Mild LVH.  Mild MR.  EF estimated 40-45%.  Moderate LA dilation.;; c)  Afib. Mild to mod dilated LV with mild LVH. EF 30-35% with diffuse HK. Mildly dilated RV with mildly decrease Systolic Fxn. Mild MR   Family History  Problem Relation Age of Onset  . Diabetes Mother   . Heart disease Father   . Congestive Heart Failure Sister   . Stroke Brother   . Heart disease Brother   . Diabetes Brother    Social History    Socioeconomic History  . Marital status: Widowed    Spouse name: Not on file  . Number of children: Not on file  . Years of education: Not on file  . Highest education level: Not on file  Occupational History  . Occupation: disabled  Social Needs  . Financial resource strain: Not on file  . Food insecurity:    Worry: Not on file    Inability: Not on file  . Transportation needs:    Medical: Not on file    Non-medical: Not on file  Tobacco Use  . Smoking status: Former Smoker    Last attempt to quit: 2000  Years since quitting: 19.8  . Smokeless tobacco: Never Used  Substance and Sexual Activity  . Alcohol use: No  . Drug use: No  . Sexual activity: Yes  Lifestyle  . Physical activity:    Days per week: Not on file    Minutes per session: Not on file  . Stress: Not on file  Relationships  . Social connections:    Talks on phone: Not on file    Gets together: Not on file    Attends religious service: Not on file    Active member of club or organization: Not on file    Attends meetings of clubs or organizations: Not on file    Relationship status: Not on file  Other Topics Concern  . Not on file  Social History Narrative  . Not on file    Outpatient Encounter Medications as of 09/18/2018  Medication Sig  . albuterol (PROAIR HFA) 108 (90 Base) MCG/ACT inhaler INHALE 1 PUFF INTO THE LUNGS EVERY 6 HOURS AS NEEDED FOR WHEEZING OR SHORTNESS OF BREATH  . albuterol (PROVENTIL) (2.5 MG/3ML) 0.083% nebulizer solution USE ONE VIAL USING NEBULIZER EVERY 6 (SIX) HOURS AS NEEDED FOR WHEEZING OR SHORTNESS OF BREATH.  Marland Kitchen allopurinol (ZYLOPRIM) 100 MG tablet Take 2 tablets (200 mg total) by mouth daily.  Marland Kitchen amiodarone (PACERONE) 200 MG tablet TAKE 1 TABLET(200 MG) BY MOUTH DAILY  . apixaban (ELIQUIS) 2.5 MG TABS tablet Take 1 tablet (2.5 mg total) by mouth 2 (two) times daily.  . carvedilol (COREG) 12.5 MG tablet Take 1 tablet (12.5 mg total) by mouth 2 (two) times daily with a  meal.  . Cholecalciferol (VITAMIN D PO) Take 1 tablet by mouth daily.  . colchicine 0.6 MG tablet TAKE 1 TABLET(0.6 MG) BY MOUTH TWICE DAILY  . DIGOX 125 MCG tablet Take 1 tablet (0.125 mg total) by mouth daily.  . fluticasone (FLONASE) 50 MCG/ACT nasal spray Place 1 spray into both nostrils daily.  . Fluticasone-Salmeterol (ADVAIR DISKUS) 250-50 MCG/DOSE AEPB Inhale 1 puff into the lungs 2 (two) times daily.  . furosemide (LASIX) 20 MG tablet Take 3 tablets (60 mg total) by mouth daily.  . nitroGLYCERIN (NITROSTAT) 0.4 MG SL tablet Place 0.4 mg under the tongue every 5 (five) minutes as needed for chest pain.  . potassium chloride SA (K-DUR,KLOR-CON) 20 MEQ tablet Take 1 tablet (20 mEq total) by mouth daily.  . pravastatin (PRAVACHOL) 40 MG tablet TAKE 1 TABLET(40 MG) BY MOUTH DAILY  . sacubitril-valsartan (ENTRESTO) 49-51 MG Take 1 tablet by mouth 2 (two) times daily.  Marland Kitchen spironolactone (ALDACTONE) 25 MG tablet Take 0.5 tablets (12.5 mg total) by mouth daily.   No facility-administered encounter medications on file as of 09/18/2018.     Activities of Daily Living In your present state of health, do you have any difficulty performing the following activities: 09/18/2018  Hearing? N  Vision? Y  Comment can't see out of left eye   Difficulty concentrating or making decisions? N  Walking or climbing stairs? Y  Dressing or bathing? Y  Doing errands, shopping? Y  Preparing Food and eating ? Y  Using the Toilet? Y  In the past six months, have you accidently leaked urine? N  Do you have problems with loss of bowel control? N  Managing your Medications? N  Managing your Finances? N  Comment has difficulty with fianances   Housekeeping or managing your Housekeeping? N  Some recent data might be hidden    Patient Care  Team: [place], Betty G, MD as PCP - General (Family Medicine) Orpah Cobb, MD as Consulting Physician (Cardiology)   Assessment:   This is a routine wellness examination  for Charles Mckinney.  Exercise Activities and Dietary recommendations Current Exercise Habits: Home exercise routine(taking care of self ), Intensity: Moderate  Goals    . Patient Stated     Keep going! Take of Buddy        Fall Risk Fall Risk  09/18/2018 07/19/2017 07/06/2017 06/19/2017 06/06/2017  Falls in the past year? No (No Data) (No Data) (No Data) (No Data)  Comment - No new falls reported today No new falls reported today No new falls reported today Patient denies new falls today  Risk for fall due to : - - - - Impaired vision     Depression Screen PHQ 2/9 Scores 09/18/2018 05/17/2017 12/16/2016 09/25/2015  PHQ - 2 Score 0 1 1 0   Currently seeing a counselor   Cognitive Function MMSE - Mini Mental State Exam 09/18/2018  Not completed: (No Data)     Ad8 score reviewed for issues:  Issues making decisions:  Less interest in hobbies / activities:  Repeats questions, stories (family complaining):  Trouble using ordinary gadgets (microwave, computer, phone):  Forgets the month or year:   Mismanaging finances:   Remembering appts:  Daily problems with thinking and/or memory: Ad8 score is=0        Immunization History  Administered Date(s) Administered  . Influenza, High Dose Seasonal PF 08/31/2015, 08/31/2015, 08/22/2016  . Influenza,inj,Quad PF,6+ Mos 02/04/2014  . Influenza-Unspecified 09/06/2017, 09/04/2018  . Pneumococcal Conjugate-13 09/26/2016  . Pneumococcal Polysaccharide-23 01/13/2015  . Zoster 08/26/2016      Screening Tests Health Maintenance  Topic Date Due  . COLON CANCER SCREENING ANNUAL FOBT  04/02/2018  . COLONOSCOPY  09/19/2019 (Originally 08/10/2000)  . TETANUS/TDAP  09/19/2019 (Originally 08/10/1970)  . PNA vac Low Risk Adult (2 of 2 - PPSV23) 01/14/2020  . INFLUENZA VACCINE  Completed  . Hepatitis C Screening  Completed         Plan:     PCP Notes   Health Maintenance   Tetanus - postponed due finances No colonoscopy noted -  [facility]  [facility]obt [facility]  Flu vaccine had a pharmacy per his report   Abnormal Screens  None today   Referrals  [facility] fup on food, meds etc   Patient concerns; meds but appears all meds have been refilled. The patient had a few transferred to other pharmacy   The patient states he was short of breath today but resolved when sitting. Oxygen was 95/96 and pulse 78  Discussed his financial issues; states he took one lasix this am because of coming to the office but will take 2 when he gets home. Will skip one fluid pill x 2 days but will take today and tomorrow. Offered to call cardiology but declined. States he "knows what to do".   Nurse Concerns; Will refer [facility] for close fup on CHF home management and resources. Not sure if he weighs every day. Takes his lasix according to his apts that day   Next PCP apt Cardiology since  Apt with dr. [name] 10/31/2018 [facility]    I have personally reviewed and noted the following in the patient's chart:   . Medical and social history . Use of alcohol, tobacco or illicit drugs  . Current medications and supplements . Functional ability and status . Nutritional  status . Physical activity . Advanced directives . List of other physicians . Hospitalizations, surgeries, and ER visits in previous 12 months . Vitals . Screenings to include cognitive, depression, and falls . Referrals and appointments  In addition, I have reviewed and discussed with patient certain preventive protocols, quality metrics, and best practice recommendations. A written personalized care plan for preventive services as well as general preventive health recommendations were provided to patient.     Montine Circle, RN  09/18/2018

## 2018-09-18 ENCOUNTER — Ambulatory Visit (INDEPENDENT_AMBULATORY_CARE_PROVIDER_SITE_OTHER): Payer: Medicare Other

## 2018-09-18 VITALS — BP 140/80 | HR 78 | Ht 70.0 in | Wt 338.0 lb

## 2018-09-18 DIAGNOSIS — Z0000 Encounter for general adult medical examination without abnormal findings: Secondary | ICD-10-CM

## 2018-09-18 NOTE — Patient Instructions (Addendum)
[name] , Thank you for taking time to come for your Medicare Wellness Visit. I appreciate your ongoing commitment to your health goals. Please review the following plan we discussed and let me know if I can assist you in the future.   These are the goals we discussed: Goals    . Patient Stated     Keep going! Take of Buddy        This is a list of the screening recommended for you and due dates:  Health Maintenance  Topic Date Due  . Tetanus Vaccine  08/10/1969  . Colon Cancer Screening  08/10/2000  . Stool Blood Test  04/02/2018  . Flu Shot  07/05/2018  . Pneumonia vaccines (2 of 2 - PPSV23) 01/14/2020  .  Hepatitis C: One time screening is recommended by Center for Disease Control  (CDC) for  adults born from [birth year] through 1966.   Completed      Fall Prevention in the Home Falls can cause injuries. They can happen to people of all ages. There are many things you can do to make your home safe and to help prevent falls. What can I do on the outside of my home?  Regularly fix the edges of walkways and driveways and fix any cracks.  Remove anything that might make you trip as you walk through a door, such as a raised step or threshold.  Trim any bushes or trees on the path to your home.  Use bright outdoor lighting.  Clear any walking paths of anything that might make someone trip, such as rocks or tools.  Regularly check to see if handrails are loose or broken. Make sure that both sides of any steps have handrails.  Any raised decks and porches should have guardrails on the edges.  Have any leaves, snow, or ice cleared regularly.  Use sand or salt on walking paths during winter.  Clean up any spills in your garage right away. This includes oil or grease spills. What can I do in the bathroom?  Use night lights.  Install grab bars by the toilet and in the tub and shower. Do not use towel bars as grab bars.  Use non-skid mats or decals in the tub or shower.  If  you need to sit down in the shower, use a plastic, non-slip stool.  Keep the floor dry. Clean up any water that spills on the floor as soon as it happens.  Remove soap buildup in the tub or shower regularly.  Attach bath mats securely with double-sided non-slip rug tape.  Do not have throw rugs and other things on the floor that can make you trip. What can I do in the bedroom?  Use night lights.  Make sure that you have a light by your bed that is easy to reach.  Do not use any sheets or blankets that are too big for your bed. They should not hang down onto the floor.  Have a firm chair that has side arms. You can use this for support while you get dressed.  Do not have throw rugs and other things on the floor that can make you trip. What can I do in the kitchen?  Clean up any spills right away.  Avoid walking on wet floors.  Keep items that you use a lot in easy-to-reach places.  If you need to reach something above you, use a strong step stool that has a grab bar.  Keep electrical cords out of  the way.  Do not use floor polish or wax that makes floors slippery. If you must use wax, use non-skid floor wax.  Do not have throw rugs and other things on the floor that can make you trip. What can I do with my stairs?  Do not leave any items on the stairs.  Make sure that there are handrails on both sides of the stairs and use them. Fix handrails that are broken or loose. Make sure that handrails are as long as the stairways.  Check any carpeting to make sure that it is firmly attached to the stairs. Fix any carpet that is loose or worn.  Avoid having throw rugs at the top or bottom of the stairs. If you do have throw rugs, attach them to the floor with carpet tape.  Make sure that you have a light switch at the top of the stairs and the bottom of the stairs. If you do not have them, ask someone to add them for you. What else can I do to help prevent falls?  Wear shoes  that: ? Do not have high heels. ? Have rubber bottoms. ? Are comfortable and fit you well. ? Are closed at the toe. Do not wear sandals.  If you use a stepladder: ? Make sure that it is fully opened. Do not climb a closed stepladder. ? Make sure that both sides of the stepladder are locked into place. ? Ask someone to hold it for you, if possible.  Clearly mark and make sure that you can see: ? Any grab bars or handrails. ? First and last steps. ? Where the edge of each step is.  Use tools that help you move around (mobility aids) if they are needed. These include: ? Canes. ? Walkers. ? Scooters. ? Crutches.  Turn on the lights when you go into a dark area. Replace any light bulbs as soon as they burn out.  Set up your furniture so you have a clear path. Avoid moving your furniture around.  If any of your floors are uneven, fix them.  If there are any pets around you, be aware of where they are.  Review your medicines with your doctor. Some medicines can make you feel dizzy. This can increase your chance of falling. Ask your doctor what other things that you can do to help prevent falls. This information is not intended to replace advice given to you by your health care provider. Make sure you discuss any questions you have with your health care provider. Document Released: 09/17/2009 Document Revised: 04/28/2016 Document Reviewed: 12/26/2014 Elsevier Interactive Patient Education  2018 [employer].   Health Maintenance, Male A healthy lifestyle and preventive care is important for your health and wellness. Ask your health care provider about what schedule of regular examinations is right for you. What should I know about weight and diet? Eat a Healthy Diet  Eat plenty of vegetables, fruits, whole grains, low-fat dairy products, and lean protein.  Do not eat a lot of foods high in solid fats, added sugars, or salt.  Maintain a Healthy Weight Regular exercise can help  you achieve or maintain a healthy weight. You should:  Do at least 150 minutes of exercise each week. The exercise should increase your heart rate and make you sweat (moderate-intensity exercise).  Do strength-training exercises at least twice a week.  Watch Your Levels of Cholesterol and Blood Lipids  Have your blood tested for lipids and cholesterol every 5 years  starting at 68 years of age. If you are at high risk for heart disease, you should start having your blood tested when you are 68 years old. You may need to have your cholesterol levels checked more often if: ? Your lipid or cholesterol levels are high. ? You are older than 68 years of age. ? You are at high risk for heart disease.  What should I know about cancer screening? Many types of cancers can be detected early and may often be prevented. Lung Cancer  You should be screened every year for lung cancer if: ? You are a current smoker who has smoked for at least 30 years. ? You are a former smoker who has quit within the past 15 years.  Talk to your health care provider about your screening options, when you should start screening, and how often you should be screened.  Colorectal Cancer  Routine colorectal cancer screening usually begins at 68 years of age and should be repeated every 5-10 years until you are 68 years old. You may need to be screened more often if early forms of precancerous polyps or small growths are found. Your health care provider may recommend screening at an earlier age if you have risk factors for colon cancer.  Your health care provider may recommend using home test kits to check for hidden blood in the stool.  A small camera at the end of a tube can be used to examine your colon (sigmoidoscopy or colonoscopy). This checks for the earliest forms of colorectal cancer.  Prostate and Testicular Cancer  Depending on your age and overall health, your health care provider may do certain tests to  screen for prostate and testicular cancer.  Talk to your health care provider about any symptoms or concerns you have about testicular or prostate cancer.  Skin Cancer  Check your skin from head to toe regularly.  Tell your health care provider about any new moles or changes in moles, especially if: ? There is a change in a mole's size, shape, or color. ? You have a mole that is larger than a pencil eraser.  Always use sunscreen. Apply sunscreen liberally and repeat throughout the day.  Protect yourself by wearing long sleeves, pants, a wide-brimmed hat, and sunglasses when outside.  What should I know about heart disease, diabetes, and high blood pressure?  If you are 35-53 years of age, have your blood pressure checked every 3-5 years. If you are 69 years of age or older, have your blood pressure checked every year. You should have your blood pressure measured twice-once when you are at a hospital or clinic, and once when you are not at a hospital or clinic. Record the average of the two measurements. To check your blood pressure when you are not at a hospital or clinic, you can use: ? An automated blood pressure machine at a pharmacy. ? A home blood pressure monitor.  Talk to your health care provider about your target blood pressure.  If you are between 38-57 years old, ask your health care provider if you should take aspirin to prevent heart disease.  Have regular diabetes screenings by checking your fasting blood sugar level. ? If you are at a normal weight and have a low risk for diabetes, have this test once every three years after the age of 25. ? If you are overweight and have a high risk for diabetes, consider being tested at a younger age or more often.  A  one-time screening for abdominal aortic aneurysm (AAA) by ultrasound is recommended for men aged 65-75 years who are current or former smokers. What should I know about preventing infection? Hepatitis B If you have a  higher risk for hepatitis B, you should be screened for this virus. Talk with your health care provider to find out if you are at risk for hepatitis B infection. Hepatitis C Blood testing is recommended for:  Everyone born from [birth year] through 1966.  Anyone with known risk factors for hepatitis C.  Sexually Transmitted Diseases (STDs)  You should be screened each year for STDs including gonorrhea and chlamydia if: ? You are sexually active and are younger than 68 years of age. ? You are older than 68 years of age and your health care provider tells you that you are at risk for this type of infection. ? Your sexual activity has changed since you were last screened and you are at an increased risk for chlamydia or gonorrhea. Ask your health care provider if you are at risk.  Talk with your health care provider about whether you are at high risk of being infected with HIV. Your health care provider may recommend a prescription medicine to help prevent HIV infection.  What else can I do?  Schedule regular health, dental, and eye exams.  Stay current with your vaccines (immunizations).  Do not use any tobacco products, such as cigarettes, chewing tobacco, and e-cigarettes. If you need help quitting, ask your health care provider.  Limit alcohol intake to no more than 2 drinks per day. One drink equals 12 ounces of beer, 5 ounces of wine, or 1 ounces of hard liquor.  Do not use street drugs.  Do not share needles.  Ask your health care provider for help if you need support or information about quitting drugs.  Tell your health care provider if you often feel depressed.  Tell your health care provider if you have ever been abused or do not feel safe at home. This information is not intended to replace advice given to you by your health care provider. Make sure you discuss any questions you have with your health care provider. Document Released: 05/19/2008 Document Revised: 07/20/2016  Document Reviewed: 08/25/2015 Elsevier Interactive Patient Education  [employer].

## 2018-09-19 ENCOUNTER — Encounter: Payer: Self-pay | Admitting: *Deleted

## 2018-09-19 ENCOUNTER — Other Ambulatory Visit: Payer: Self-pay | Admitting: *Deleted

## 2018-09-19 NOTE — Progress Notes (Signed)
I have reviewed documentation from this visit and I agree with recommendations given.  Tameko Halder G. Jissell Trafton, MD  [facility]. [place] office.   

## 2018-09-19 NOTE — Patient Outreach (Signed)
[facility]) Care Management  09/19/2018  Charles Mckinney Jun 19, 1951 [id number]   Telephone Screen  Referral Date: 09/18/18 Referral Source: MD referral - [facility]  Referral Reason: CHF management and resource deficits   Diagnoses of Heart Failure   The patient is between MDs with Dr. Kirtland Bouchard retiring. In for AWV and c/o of sob but has not taken his evening fluid pill x 2 days due to apt. Agreed to take; o2 sats were good, pulse good, but lives with dog and declines assisted living.  States he does not have money for heat this winter.  Visit to check and reinforce medical management of his disease.  Diet rich in sodium difficult to manage due to income.   Insurance:united health care medicare and veteran's administration services [facility])    Outreach attempt # 1 successful at the listed home number Patient is able to verify HIPAA Reviewed and addressed referral to [facility] with patient  He began to complain about voiding a lot, not breathing good at all and having issues with heat in his home.  CM reviewed [facility] available services He discussed receiving services previously in 2018    Social: Mr Swigert lives alone in a home left to him by a deceased family member with his dog and per [employer] RN referral he declines assisted living. He reports his house payment is $560, he is on SSI fixed income and has financial concerns paying other bills and getting food.  He is divorced but his ex wife assists him at intervals with transportation to medical appointments to the [place] in [place] or to run errands when he does not trust using his truck.  He has sons, Minerva Areola and Minerva Areola is his POA. His sons assists at intervals to do house work like mowing his lawn. One son lives in [place]. 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 He also reports trying to get assistance for his poor vision   He reports having trouble with anger and depression. He is scheduled to see a counselor, "Dennie Bible" at the "health and wellness off [address]" He reports his  2nd visit to this counselor will be on 09/20/18 He confirms with CM he is not on medicine at this time for depression. He reports trying to get assist from 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 He reports having a state program to assist with medications and most of his medications cost $3-4 but his Eliquis and entresto cost $8.50 He discussed concerns with voiding excessively and his diuretic dose has been decreased by his MD He reports some resolution [facility] CM encouraged monitoring of high sodium food intake but Mr Decaire informed CM he can not afford low sodium foods and "  It does not matter." He ws referred to his cardiologist   Appointments: Saw a wellness RN in his primary MD Kathie Rhodes [place]) office on 09/18/18 He states he is now going to see a Dr Daphine Deutscher at the cardiology office off [address], [place] vs his previous Cardiologist, [name] related to cost concerns He is scheduled in Epic to be seen 10/31/18 by Dr [name] to follow up a 10/18/18 ECHO     Advance Directives: His son Minerva Areola is his POA and his has a living will He denies any need for any changes at this time  Consent: [facility] RN CM reviewed [facility] services with patient. Patient  gave verbal consent for services [facility] and [facility] Community RN CM. He recalls the name of a previous assigned [facility] RN CM   Plan: [facility] RN CM will refer Mr Spearin to [facility] (does not have money for heat this winter, community resources) and [facility] Community RN CM (CHF disease management, not taking medications as ordered, in between of Cardiologist, poor nutrition)    Kimberly L. Noelle Penner, RN, BSN, CCM [facility] Telephonic Care Management Care Coordinator Office number [phone] Mobile number [phone]  Main [facility] number [phone] Fax number [phone]

## 2018-09-20 ENCOUNTER — Other Ambulatory Visit: Payer: Self-pay

## 2018-09-20 NOTE — Patient Outreach (Signed)
[facility]) Care Management  09/20/2018  Charles Mckinney 1951/11/06 [id number]   Initial outreach to patient regarding social work referral for financial and community resources.  [name] reported that he is unable to afford heat in his home.  He and BSW discussed resources that provide assistance with utilities.  [name] reported that he has attempted to get assistance from [facility] and [employer] but was told that they currently do not have funds available.  He reported that he has also attempted to get assistance from the [employer] but was denied due to his yearly income exceeding $12,000.   BSW agreed to mail [name] a list of other agencies that can possibly provide assistance.  BSW and [name] discussed applying for Medicaid.  He is slightly over the income limit but requested that an application be mailed to him any way.   [name] receives $15 per month in food stamps.  BSW agreed to mail [employer] and [employer] to him which list local food pantries and kitchens. BSW will follow up with him next week to ensure receipt of resources mailed.   Malachy Chamber, BSW Social Worker [phone]

## 2018-09-21 ENCOUNTER — Other Ambulatory Visit: Payer: Self-pay | Admitting: Internal Medicine

## 2018-09-21 ENCOUNTER — Other Ambulatory Visit: Payer: Self-pay | Admitting: *Deleted

## 2018-09-21 ENCOUNTER — Other Ambulatory Visit: Payer: Self-pay | Admitting: Family Medicine

## 2018-09-21 DIAGNOSIS — I5022 Chronic systolic (congestive) heart failure: Secondary | ICD-10-CM

## 2018-09-21 NOTE — Patient Outreach (Signed)
[facility]) Care Management  09/21/2018  ABDULHADI HAMILL 1951/03/01 [id number]   Referral received from telephonic care manager for home assessment and management of heart failure.  Per chart, he also has history of atrial fibrillation, hypertension, cardiomyopathy, CAD, CABG, OSA, hyperlipidemia, and CKD.    Call placed to member, identity verified.  This care manager introduced self and purpose of call.  [facility] care management services explained.  He is familiar as he was active last year.  He expresses concern regarding financial problems and difficulty affording heat as well as food.  He has already been in contact with BSW to address these concerns.  Member admit that he was not taking medication as he was instructed because he didn't want to be up during the night or having to go to the bathroom a lot while he was out.  State he has restarted taking fluid pills as he should.  He also report he will begin daily weight again.  State he has a tablet from his insurance company ([employer]) but couldn't get it to work.  State he will call the company for further instructions.    Denies any urgent concerns at this time, agrees to home visit within the next 2 weeks.  Will complete home assessment and develop individualized care plan at that time.  Kemper Durie, [place], MSN [facility] Care Management  [facility] Manager [phone]

## 2018-09-27 ENCOUNTER — Telehealth: Payer: Self-pay | Admitting: Family Medicine

## 2018-09-27 NOTE — Telephone Encounter (Signed)
Left VM to clarify refill request; pt has 3 medications available at [facility] [address]. Does he want them sent to [facility]? Pt stated he has been Dietitian

## 2018-09-27 NOTE — Telephone Encounter (Signed)
Copied from CRM [id number]. Topic: Quick Communication - Rx Refill/Question >> Sep 27, 2018 11:59 AM Stephannie Li, NT wrote: Medication: amiodarone (PACERONE) 200 MG tablet  Has the patient contacted their pharmacy? yes  (Agent: If no, request that the patient contact the pharmacy for the refill. Yes  (Agent: If yes, when and what did the pharmacy advise?) [facility] and the patient wants it sent to [facility]   Preferred Pharmacy (with phone number or street name  [facility] - [place], [place] - [address] [phone] (Phone) [phone] (Fax)    Agent: Please be advised that RX refills may take up to 3 business days. We ask that you follow-up with your pharmacy.

## 2018-09-28 ENCOUNTER — Ambulatory Visit: Payer: Self-pay

## 2018-09-28 NOTE — Telephone Encounter (Signed)
Left vm to return call to see which medications need refilled and what pharmacy.

## 2018-10-01 ENCOUNTER — Other Ambulatory Visit: Payer: Self-pay | Admitting: *Deleted

## 2018-10-01 NOTE — Telephone Encounter (Signed)
Lm to give clinic a call back concerning what medications he needed refilled.

## 2018-10-01 NOTE — Patient Outreach (Signed)
[facility]) Care Management   10/01/2018  Charles Mckinney 1951-03-10 [id number]  Charles Mckinney is an 67 y.o. male  Subjective:   Member alert and oriented x3, denies complaints of pain or discomfort.  Report he has some shortness of breath at times, has rescue inhaler and nebulizer if needed.    Objective:   Review of Systems  Constitutional: Negative.   HENT: Negative.   Eyes: Negative.   Respiratory: Positive for cough and shortness of breath.        Intermittent  Cardiovascular: Negative.   Gastrointestinal: Negative.   Genitourinary: Negative.   Musculoskeletal: Negative.   Skin: Negative.   Neurological: Negative.   Endo/Heme/Allergies: Negative.   Psychiatric/Behavioral: Negative.     Physical Exam  Constitutional: He is oriented to person, place, and time. He appears well-developed and well-nourished.  Neck: Normal range of motion.  Cardiovascular: Normal rate.  Respiratory: Effort normal and breath sounds normal.  GI: Soft. Bowel sounds are normal.  Musculoskeletal: Normal range of motion.  Neurological: He is alert and oriented to person, place, and time.  Skin: Skin is warm and dry.   BP 128/80 (BP Location: Right Arm, Patient Position: Sitting, Cuff Size: Large)   Pulse 84   Resp 20   Ht 1.778 m (_0 )   Wt (!) 336 lb (152.4 kg)   SpO2 96%   BMI 48.21 kg/m   Encounter Medications:   Outpatient Encounter Medications as of 10/01/2018  Medication Sig Note  . albuterol (PROAIR HFA) 108 (90 Base) MCG/ACT inhaler INHALE 1 PUFF INTO THE LUNGS EVERY 6 HOURS AS NEEDED FOR WHEEZING OR SHORTNESS OF BREATH   . albuterol (PROVENTIL) (2.5 MG/3ML) 0.083% nebulizer solution USE ONE VIAL USING NEBULIZER EVERY 6 (SIX) HOURS AS NEEDED FOR WHEEZING OR SHORTNESS OF BREATH.   Marland Kitchen allopurinol (ZYLOPRIM) 100 MG tablet TAKE 2 TABLETS(200 MG) BY MOUTH DAILY   . amiodarone (PACERONE) 200 MG tablet TAKE 1 TABLET(200 MG) BY MOUTH DAILY   . apixaban (ELIQUIS)  2.5 MG TABS tablet Take 1 tablet (2.5 mg total) by mouth 2 (two) times daily.   . carvedilol (COREG) 12.5 MG tablet Take 1 tablet (12.5 mg total) by mouth 2 (two) times daily with a meal.   . carvedilol (COREG) 6.25 MG tablet TAKE 1 TABLET(6.25 MG) BY MOUTH TWICE DAILY WITH A MEAL   . Cholecalciferol (VITAMIN D PO) Take 1 tablet by mouth daily.   . colchicine 0.6 MG tablet TAKE 1 TABLET(0.6 MG) BY MOUTH TWICE DAILY 07/19/2017: Patient reports gout is better, so he is no longer taking  . DIGOX 125 MCG tablet Take 1 tablet (0.125 mg total) by mouth daily.   . fluticasone (FLONASE) 50 MCG/ACT nasal spray Place 1 spray into both nostrils daily.   . Fluticasone-Salmeterol (ADVAIR DISKUS) 250-50 MCG/DOSE AEPB Inhale 1 puff into the lungs 2 (two) times daily.   . furosemide (LASIX) 20 MG tablet Take 3 tablets (60 mg total) by mouth daily.   . nitroGLYCERIN (NITROSTAT) 0.4 MG SL tablet Place 0.4 mg under the tongue every 5 (five) minutes as needed for chest pain. 06/06/2017: Reports has not needed recently  . potassium chloride SA (K-DUR,KLOR-CON) 20 MEQ tablet Take 1 tablet (20 mEq total) by mouth daily.   . pravastatin (PRAVACHOL) 40 MG tablet TAKE 1 TABLET(40 MG) BY MOUTH DAILY   . sacubitril-valsartan (ENTRESTO) 49-51 MG Take 1 tablet by mouth 2 (two) times daily.   Marland Kitchen spironolactone (ALDACTONE) 25 MG tablet  Take 0.5 tablets (12.5 mg total) by mouth daily.    No facility-administered encounter medications on file as of 10/01/2018.     Functional Status:   In your present state of health, do you have any difficulty performing the following activities: 10/01/2018 09/18/2018  Hearing? N N  Vision? 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 N N  Walking or climbing stairs? Y Y  Dressing or bathing? N Y  Doing errands, shopping? N Y  Conservation officer, nature and eating ? N Y  Using the Toilet? Y Y  In the past six months, have you accidently leaked urine? Y N  Do  you have problems with loss of bowel control? - N  Managing your Medications? N N  Managing your Finances? N N  Comment - has difficulty with fianances   Housekeeping or managing your Housekeeping? Y N  Some recent data might be hidden    Fall/Depression Screening:    Fall Risk  10/01/2018 09/18/2018 07/19/2017  Falls in the past year? No No (No Data)  Comment - - No new falls reported today  Risk for fall due to : - - -   PHQ 2/9 Scores 09/19/2018 09/18/2018 05/17/2017 12/16/2016 09/25/2015  PHQ - 2 Score 2 0 1 1 0  PHQ- 9 Score 3 - - - -    Assessment:    Met with member at scheduled time.  Assessment complete.  He voices major concerns regarding finances.  He is unable to afford heat but state his counselor provided him with a radiator.  He still complains of inability to adhere to adequate diet due to finances.  Active with [facility] BSW for resources.  He does try to limit salt intake (canned foods) by rinsing food prior to eating.    Attempted to review medications however member state his dog is in the kitchen where they are located.  He is afraid if he open the door the dog will attack/bite this care manager.  He is adamant that he has all medications and is taking properly.  Member's home is very disorganized, tables/chairs/items covered with dirt, dust, and dog hair.  This care manager inquired about having friends/family to help with cleaning.  He state his sons come to mow the lawn but he has no one to clean inside the home.  State "I do the best I can."  He denies any urgent concerns, advised to contact this care manager with questions.  Provided contact information.  Plan:   Will follow up with member within the next month.  THN CM Care Plan Problem One     Most Recent Value  Care Plan Problem One  Knowledge deficit regarding importance of adherence to heart failure management  Role Documenting the Problem One  Care Management [place] for Problem One  Active   THN Long Term Goal   Member will verbalize understanding of yellow zone from heart failure action plan within the next 45 days  THN Long Term Goal Start Date  09/21/18  Interventions for Problem One Long Term Goal  Provided with copy of heart failure zones, re-educated on importance of management  THN CM Short Term Goal #1   Member will weigh self daily over the next 4 weeks  THN CM Short Term Goal #1 Start Date  09/21/18  Interventions for Short Term Goal #1  Confirmed member has telemonitoring equipment from [place]  Term Goal #2   Member will report compliance of medications, especially lasix, over the next 4 weeks  THN CM Short Term Goal #2 Start Date  09/21/18  Interventions for Short Term Goal #2  Attempted to review medications, educated on importance of compliance  THN CM Short Term Goal #3  Member will keep and attend appointment with cardiologist within the next 4 weeks  THN CM Short Term Goal #3 Start Date  10/01/18  Interventions for Short Tern Goal #3  Confirmed member has appointment scheduled, report he has transportation     Valente David, Therapist, sports, MSN [place] [phone]

## 2018-10-03 ENCOUNTER — Other Ambulatory Visit: Payer: Self-pay

## 2018-10-03 NOTE — Patient Outreach (Signed)
[facility]) Care Management  10/03/2018  Charles Mckinney Aug 18, 1951 [id number]   Follow up call to [name] to ensure receipt of resources mailed on 09/20/18.  [name] confirmed receipt of Medicaid application, list of agencies that can possibly provide assistance with purchase of kerosene, and list of local food pantries and kitchens.   [name] reported that someone gave him a radiator which is minimally helping to heat the house.  He has not yet reached out to agencies regarding financial assistance with purchasing kerosene. Per [name], he received assistance from church members, family, and [place] [employer] last year.  He intends to request assistance from the same resources this year.  BSW encouraged him to call the additional resources provided to inquire about what assistance they can provide.  [name] agreed to contact resources.  BSW will follow up again next week to get an update.  Malachy Chamber, BSW Social Worker [phone]

## 2018-10-05 NOTE — Telephone Encounter (Signed)
Spoke with patient and he stated that he has picked up all his medications. No further assistance needed at this time.

## 2018-10-09 ENCOUNTER — Other Ambulatory Visit: Payer: Self-pay

## 2018-10-09 NOTE — Patient Outreach (Signed)
[facility]) Care Management  10/09/2018  [name] 15-Oct-1951 [id number]   Follow up call to Mr. Flenniken regarding resources provided to possibly assist with financial assistance for purchasing kerosene for his heater.  Mr. Clavette contacted [place] [employer] and was linked to [facility].  A representative from this agency completed a home visit yesterday.  They are requesting verification of cost for kerosene.  Mr. Stickle reported that he plans to go to [employer] today so that they can fax this information to [employer].  Per [name], he also spoke with a representative from [employer].  This representative is supposed to contact [place] for cost and account information but they did not give confirmation during the call that they can provide assistance.  BSW encouraged Mr. Gerstner to follow up with them before the end of the week. BSW will follow up with Mr. Camerino again next week to get an update on assistance being provided.  Malachy Chamber, BSW Social Worker [phone]

## 2018-10-16 ENCOUNTER — Other Ambulatory Visit: Payer: Self-pay

## 2018-10-16 NOTE — Patient Outreach (Signed)
[facility]) Care Management  10/16/2018  SIDDARTH YERBY [birth year] [id number]   Follow up call to Mr. Brusseau regarding resources for financial assistance with purchase of kerosene for heat.  Per Mr. Blanken, [employer] representative provided assistance for 100 gallons of kerosene which he says will last until approximately February.  Mr. Mccarney was told to fax proof of pricing for his kerosene to the [facility] because they sometimes provide assistance for veterans.  Per Mr. Starkman, [employer] has submitted this information but he has not been able to connect with the Child psychotherapist to follow up.  BSW got contact information for this Social Worker, [employer], and left a voicemail message on his behalf.  Mr. Waltman said that [employer] will likely be able to provide assistance as well but they need proof of his social security income.  Mr. Like said that he plans to go to the Social Security Administration tomorrow to request this information.  BSW will follow up with Mr. Rozier again next week.  BSW will attempt to contact Ms. Southern again by the end of the week if no return call.  Malachy Chamber, BSW Social Worker [phone]

## 2018-10-18 ENCOUNTER — Other Ambulatory Visit: Payer: Self-pay

## 2018-10-18 ENCOUNTER — Ambulatory Visit (HOSPITAL_COMMUNITY): Payer: Medicare Other | Attending: Internal Medicine

## 2018-10-18 DIAGNOSIS — I5042 Chronic combined systolic (congestive) and diastolic (congestive) heart failure: Secondary | ICD-10-CM | POA: Insufficient documentation

## 2018-10-18 NOTE — Patient Outreach (Addendum)
[facility]) Care Management  10/18/2018  Charles Mckinney [birth year] [id number]  BSW received return call from Charles Mckinney at [facility] regarding request for financial assistance with purchasing kerosene.  Ms. Charles Mckinney reported that requested documentation must have been sent to wrong fax number because it has not been received.  She called Charles Mckinney and left him a message with the correct fax number. BSW attempted to reach him to ensure receipt of this message but he did not answer.  BSW left voicemail message requesting return call.  BSW will attempt to reach him again within four business days.   Addendum: Charles Mckinney returned call and confirmed receipt of correct fax number.  He said he plans to go to [facility] tomorrow to have requested documentation sent to [facility].  BSW will follow up again next week.   Charles Mckinney, BSW Social Worker [phone]

## 2018-10-19 ENCOUNTER — Encounter: Payer: Self-pay | Admitting: *Deleted

## 2018-10-23 ENCOUNTER — Other Ambulatory Visit: Payer: Self-pay

## 2018-10-23 ENCOUNTER — Ambulatory Visit: Payer: Self-pay

## 2018-10-23 NOTE — Patient Outreach (Signed)
[facility]) Care Management  10/23/2018  Charles Mckinney [birth year] [id number]   Follow up attempt to [name] regarding financial resources for purchase of kerosene.  BSW left voicemail message.  Will attempt to reach him again within four business days.  Malachy Chamber, BSW Social Worker [phone]

## 2018-10-23 NOTE — Patient Outreach (Signed)
[facility]) Care Management  10/23/2018  Charles Mckinney 1951-10-07 [id number]   Medication Adherence call to [name] spoke with patient he said he still has half of bottle and is going to need it but he can not afford to pay for his medication at this time. [name] is due on Pravastatin 40 mg.spoke with Information systems manager she is trying to find some financial help for his heating equipment. [name] is showing past due under [facility] Ins.   Charles Mckinney CPhT Pharmacy Technician [facility] Care Management Direct Dial [phone]  Fax [phone] [email or web address]

## 2018-10-25 ENCOUNTER — Other Ambulatory Visit: Payer: Self-pay

## 2018-10-25 NOTE — Patient Outreach (Signed)
[facility]) Care Management  10/25/2018  Charles Mckinney Jan 24, 1951 [id number]  Follow up call to Charles Mckinney regarding financial assistance for purchase of kerosene.  Charles Mckinney has received assistance from [employer] for 100 gallons of kerosene which he says should last until January or February.  He is seeking assistance from [facility] for additional kerosene.  They confirmed receipt of required documentation from [facility] [employer] [facility] on 10/23/18.  BSW requested that Charles Mckinney call if he is contacted with an update on assistance.  If no follow up from him by the end of the week, BSW will contact [facility] to get an update.    Malachy Chamber, BSW Social Worker [phone]

## 2018-10-29 ENCOUNTER — Other Ambulatory Visit: Payer: Self-pay

## 2018-10-29 ENCOUNTER — Ambulatory Visit: Payer: Self-pay

## 2018-10-29 NOTE — Patient Outreach (Signed)
[facility]) Care Management  10/29/2018  TAJAI LENNARTZ 12-10-1950 [id number]   Follow up call to Mr. Kleiber regarding social work referral for financial assistance with the purchase of kerosene.  BSW left voicemail message.  Will attempt to reach him again within four business days .  Malachy Chamber, BSW Social Worker [phone]

## 2018-10-30 ENCOUNTER — Other Ambulatory Visit: Payer: Self-pay | Admitting: Internal Medicine

## 2018-10-30 ENCOUNTER — Other Ambulatory Visit: Payer: Self-pay | Admitting: *Deleted

## 2018-10-30 DIAGNOSIS — Z87891 Personal history of nicotine dependence: Secondary | ICD-10-CM

## 2018-10-30 NOTE — Patient Outreach (Signed)
[facility]) Care Management  10/30/2018  Charles Mckinney 1951-02-16 [id number]   Call placed to member to follow up on current health status, no answer.  HIPAA compliant voice message left.  Will follow up within the next 4 business days.  Kemper Durie, [place], MSN [facility] Care Management  [facility] Manager [phone]

## 2018-10-31 ENCOUNTER — Ambulatory Visit (INDEPENDENT_AMBULATORY_CARE_PROVIDER_SITE_OTHER): Payer: Medicare Other | Admitting: Cardiology

## 2018-10-31 ENCOUNTER — Encounter: Payer: Self-pay | Admitting: Cardiology

## 2018-10-31 VITALS — BP 130/78 | HR 86 | Ht 70.0 in | Wt 329.8 lb

## 2018-10-31 DIAGNOSIS — I5042 Chronic combined systolic (congestive) and diastolic (congestive) heart failure: Secondary | ICD-10-CM | POA: Diagnosis not present

## 2018-10-31 DIAGNOSIS — I4819 Other persistent atrial fibrillation: Secondary | ICD-10-CM | POA: Diagnosis not present

## 2018-10-31 DIAGNOSIS — I10 Essential (primary) hypertension: Secondary | ICD-10-CM

## 2018-10-31 DIAGNOSIS — E785 Hyperlipidemia, unspecified: Secondary | ICD-10-CM | POA: Diagnosis not present

## 2018-10-31 DIAGNOSIS — Z5181 Encounter for therapeutic drug level monitoring: Secondary | ICD-10-CM | POA: Diagnosis not present

## 2018-10-31 DIAGNOSIS — I255 Ischemic cardiomyopathy: Secondary | ICD-10-CM

## 2018-10-31 MED ORDER — SACUBITRIL-VALSARTAN 97-103 MG PO TABS: 1.0000 | ORAL_TABLET | Freq: Two times a day (BID) | ORAL | 7 refills | Status: DC

## 2018-10-31 MED ORDER — FUROSEMIDE 20 MG PO TABS: 60.0000 mg | ORAL_TABLET | Freq: Every day | ORAL | 3 refills | Status: DC

## 2018-10-31 NOTE — Progress Notes (Signed)
PCP: Josiah Lobo, MD  Clinic Note: Chief Complaint  Patient presents with  . Follow-up    108-month.  No major complaints  . Coronary Artery Disease    CABG mild intermittent angina  . Atrial Fibrillation    Chronic  . Cardiomyopathy    Ischemic    HPI: Charles Mckinney is a 67 y.o. male with a PMH of ICM/CAD-CABG with long-standing Afib along with HTN,HLD - OSA but not on CPAP) who presents today for 3 month f/u. I saw him for the 1st visit in Feb 2019 -- > converted to ARB with plan to start [facility].  Added digoxin for CHF & Afib Rate control.    Charles Mckinney was last seen on 8/13 by Azalee Course, PA-C as f/u for a visit in July - started on Entresto & increased Carvedilol to 12.5 mg BID in July --> titrated Entresto dose up to 49-51.  Recent Hospitalizations:   None  Studies Personally Reviewed - (if available, images/films reviewed: From Epic Chart or Care Everywhere)  Echo 10/2018: EF 30-35%.  Diffuse HK. (with Afib - unable to eval Diastolic). (STABLE)  Interval History: Charles Mckinney returns here today for follow-up stating that he is doing relatively well overall.  He says he may take nitroglycerin for somewhat concerning symptoms once or twice a month but for the most part is not noticing any chest discomfort with rest or exertion.  The chest discomfort episodes are not necessarily associated with any particular activity.  They can happen both at rest and exertion.  He is not sure if this is more related to indigestion or true angina.  He has chronic orthopnea sleeping in a recliner at 45 degrees and has occasional PND.  He has chronic edema and uses a support stockings.  He does try to stay active as best he can with his arthritis pains, and notes that his exertional dyspnea has improved since starting Entresto.  He also notes that he is not using nearly as much Lasix.  He usually does about 40 mg in the morning and 1 in the afternoon but sometimes does not take the 1  in the afternoon. He has not had any sensation of fast irregular heartbeats.  He does not really notice being in A. fib.  No syncope or near syncope.  No TIA or amaurosis fugax. No claudication.  No melena, hematochezia, hematuria, or epstaxis.   ROS: A comprehensive was performed. Review of Systems  Constitutional: Positive for malaise/fatigue (Never really has much energy) and weight loss (Trying to lose weight).  HENT: Positive for congestion and sore throat. Negative for nosebleeds.        She may be coming down with some type of cold.  Not yet coughing, but has congestion and sore throat  Respiratory: Positive for shortness of breath (At baseline). Negative for cough and wheezing.   Gastrointestinal: Negative for abdominal pain, constipation and heartburn.       Indigestion  Musculoskeletal: Positive for joint pain. Negative for falls.  Neurological: Negative for dizziness, focal weakness, seizures and weakness.  Psychiatric/Behavioral: Negative for depression and memory loss. The patient does not have insomnia.   All other systems reviewed and are negative.   I have reviewed and (if needed) personally updated the patient's problem list, medications, allergies, past medical and surgical history, social and family history.   Past Medical History:  Diagnosis Date  . Anxiety   . CAD, multiple vessel 04/2015   Right and Left  Heart Catheterization Apr 09, 2015: LM 95%; ostLAD 99% - p-dLAD 80%, D1 99% - D2 80%; OCx 90%,pCx 50%; pRCA 80%, mRCA 50%, rPDA 80% & 90%. --> CABG x 4: LIMA-LAD, SVG-Diag, SeqSVG-rPL-rPDA --> Intra-Op TEE showed EF of 30% with only mild MR.   . 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  Suggestion of anterior infarct on Myoview/echo.  Marland Kitchen GERD (gastroesophageal reflux disease)   . Hyperlipidemia   . Hypertension   . Hypertensive heart disease    Mild to moderately  dilated LV with mild LVH.  . Ischemic cardiomyopathy 01/2014   EF 30-35%.  Severe multivessel CAD noted on cath--CABG x4 -> Myoview (June 2018) shows large anterior-septal-apical infarct with no ischemia.  . Kidney stones   . Obesity (BMI 30-39.9)   . Osteoarthritis   . Permanent atrial fibrillation    On Amiodarone & Beta Blocker (on Xarelto)  . PONV (postoperative nausea and vomiting)    Cardiac History:   2014: Nonischemic Cardiolite Stress Test with EF in the 50s.  (Followed by Dr. Donnie Aho)  February 2015: Admitted with positive troponins and acute CHF (echo showed EF of 30 to 35% with anterior hypokinesis) ? Was diuresed, but [facility] ->  Lost to follow-up until January 2016  January 2016: ER with A. fib RVR and CHF --> left AMA; wanted to follow-up at the [facility]  February 2016: Admitted again with A. fib RVR and CHF.  (Apparently had not been taking Xarelto because he "was told that it was dangerous ").  Eventually underwent DCCV --> went back to [facility] with [facility]  March 31, 2015 (admitted by Dr. Orpah Cobb) -W/ chest tightness and CHF (PND and orthopnea) in Afib RVR ->  DCCV; D/C on lisinopril, carvedilol, furosemide, amiodarone and Xarelto.  Readmitted Apr 05, 2015 - CHF (PND, orthopnea and edema with progressing exertional dyspnea).   hypoxic SaO2 % in 80s requiring BiPAP and IV Lasix.  -->   ? Finally had an ischemic evaluation.  CATH --> multivessel CAD   CABG x 4: LIMA-LAD, SVG-Diag, SeqSVG-rPL-rPDA -->  ? Intra-Op TEE showed EF of 30% with only mild MR.  EF did appear to be improved post bypass  January 2018: Admitted again for CHF -> changed lisinopril to losartan w/ plans to switch to [facility].  May 2018: Admitted with TIA - unable to afford Eliquis --> back on Xarelto.->  Apparently Xarelto was been stopped for eye bleed by his ophthalmologist; continued on amiodarone and beta-blocker for A. fib;    May 31, 2017: chest pain 8  out of 10 pressure-like nonradiating associated with dyspnea.  Mild congestive heart failure noted on chest x-ray.   ? Myoview showed no ischemia.   ? Remains rate controlled on amiodarone & Coreg Has been converted from Eliquis   Past Surgical History:  Procedure Laterality Date  . CARDIAC CATHETERIZATION N/A 04/09/2015   Procedure: Right/Left Heart Cath and Coronary Angiography;  Surgeon: Orpah Cobb, MD;  Location: [facility] INVASIVE CV LAB CUPID::  LM 95%; ostLAD 99% - p-dLAD 80%, D1 99% - D2 80%; OCx 90%,pCx 50%; pRCA 80%, mRCA 50%, rPDA 80% & 90%.  . CARDIOVERSION N/A 01/19/2015   Procedure: CARDIOVERSION;  Surgeon: Othella Boyer, MD;  Location: [facility] ENDOSCOPY;  Service: Cardiovascular;  Laterality: N/A;  pt in Afib, 12:22 synched cardioversion @  120 joules using Propofol  100 mg,IV....unsuccessful, repeated at 200 joules  . CARDIOVERSION N/A 04/02/2015   Procedure: CARDIOVERSION;  Surgeon: Orpah Cobb, MD;  Location: [facility] ENDOSCOPY;  Service: Cardiovascular;  Laterality: N/A;  . CATARACT EXTRACTION    . CLIPPING OF ATRIAL APPENDAGE  04/13/2015   Procedure: CLIPPING OF ATRIAL APPENDAGE;  Surgeon: Alleen Borne, MD;  Location: [facility] OR;  Service: Open Heart Surgery;;  . CORONARY ARTERY BYPASS GRAFT N/A 04/13/2015   Procedure: CORONARY ARTERY BYPASS GRAFTING (CABG);  Surgeon: Alleen Borne, MD;  Location[facility] OR;  Service: Open Heart Surgery;; LIMA-LAD, SVG-Diag, SeqSVG-rPL-rPDA   . NM MYOVIEW LTD  05/2017   EF 27%.  No reversible ischemia.  Large anterior-apical and septal infarct.  Diffuse LV HK and moderate LV dilation.  . TEE WITHOUT CARDIOVERSION N/A 04/13/2015   Procedure: TRANSESOPHAGEAL ECHOCARDIOGRAM (TEE);  Surgeon: Alleen Borne, MD;  Location[facility] OR;  Service: Open Heart Surgery;  Laterality: N/A;  . TRANSTHORACIC ECHOCARDIOGRAM  2/'15, 2/'16, 4/'16   a) EF 30-35%.  Mild LVH.  GRII DD.  Mild LA dilation.  Moderate diastolic dysfunction. b) Technically difficult.  Mild LVH.  EF 35%.  Diffuse HK.   Moderate LA dilation.  Mildly dilated RV with mildly reduced function.  c)   Mild concentric LVH.  EF 30-35%, diffuse HK.  Severe HK of mid apical inferior wall.  Moderate LA dilation.  . TRANSTHORACIC ECHOCARDIOGRAM N/A 1/18, 5/18, 7/18   a) Moderate severely reduced LV.  EF 30 of 35% with diffuse HK.  Mild MR.  Mildly dilated RA.;; b) (In setting of TIA).  Technically difficult mild to moderate globally reduced LV function.  Mild LVH.  Mild MR.  EF estimated 40-45%.  Moderate LA dilation.;; c)  Afib. Mild to mod dilated LV with mild LVH. EF 30-35% with diffuse HK. Mildly dilated RV with mildly decrease Systolic Fxn. Mild MR    [facility] Apr 09, 2015: LM 95%; ostLAD 99% - p-dLAD 80%, D1 99% - D2 80%; OCx 90%,pCx 50%; pRCA 80%, mRCA 50%, rPDA 80% & 90%. --> CABG x 4: LIMA-LAD, SVG-Diag, SeqSVG-rPL-rPDA, LAA clipping.       Current Meds  Medication Sig  . albuterol (PROAIR HFA) 108 (90 Base) MCG/ACT inhaler INHALE 1 PUFF INTO THE LUNGS EVERY 6 HOURS AS NEEDED FOR WHEEZING OR SHORTNESS OF BREATH  . albuterol (PROVENTIL) (2.5 MG/3ML) 0.083% nebulizer solution USE ONE VIAL USING NEBULIZER EVERY 6 (SIX) HOURS AS NEEDED FOR WHEEZING OR SHORTNESS OF BREATH.  Marland Kitchen allopurinol (ZYLOPRIM) 100 MG tablet TAKE 2 TABLETS(200 MG) BY MOUTH DAILY  . amiodarone (PACERONE) 200 MG tablet TAKE 1 TABLET(200 MG) BY MOUTH DAILY  . apixaban (ELIQUIS) 2.5 MG TABS tablet Take 1 tablet (2.5 mg total) by mouth 2 (two) times daily.  . carvedilol (COREG) 12.5 MG tablet Take 1 tablet (12.5 mg total) by mouth 2 (two) times daily with a meal.  . Cholecalciferol (VITAMIN D PO) Take 1 tablet by mouth daily.  . colchicine 0.6 MG tablet Take 0.6 mg by mouth 2 (two) times daily as needed.  Marland Kitchen DIGOX 125 MCG tablet Take 1 tablet (0.125 mg total) by mouth daily.  . fluticasone (FLONASE) 50 MCG/ACT nasal spray Place 1 spray into both nostrils daily.  . Fluticasone-Salmeterol (ADVAIR DISKUS) 250-50 MCG/DOSE AEPB Inhale 1 puff into the lungs 2  (two) times daily.  . nitroGLYCERIN (NITROSTAT) 0.4 MG SL tablet Place 0.4 mg under the tongue every 5 (five) minutes as needed for chest pain.  . potassium  chloride SA (K-DUR,KLOR-CON) 20 MEQ tablet Take 1 tablet (20 mEq total) by mouth daily.  . pravastatin (PRAVACHOL) 40 MG tablet TAKE 1 TABLET(40 MG) BY MOUTH DAILY  . [DISCONTINUED] furosemide (LASIX) 20 MG tablet Take 3 tablets (60 mg total) by mouth daily.  . [DISCONTINUED] sacubitril-valsartan (ENTRESTO) 49-51 MG Take 1 tablet by mouth 2 (two) times daily.    Allergies  Allergen Reactions  . Niacin And Related Other (See Comments)    FLUSHING  . Xarelto [Rivaroxaban] Other (See Comments)    Cause bloody stools  . Penicillins Itching, Rash and Other (See Comments)    Has patient had a PCN reaction causing immediate rash, facial/tongue/throat swelling, SOB or lightheadedness with hypotension: Yes Has patient had a PCN reaction causing severe rash involving mucus membranes or skin necrosis: No Has patient had a PCN reaction that required hospitalization: No Has patient had a PCN reaction occurring within the last 10 years: No If all of the above answers are "NO", then may proceed with Cephalosporin use.     Social History   Tobacco Use  . Smoking status: Former Smoker    Last attempt to quit: 2000    Years since quitting: 19.9  . Smokeless tobacco: Never Used  Substance Use Topics  . Alcohol use: No  . Drug use: No   Social History   Social History Narrative  . Not on file   Family History family history includes Congestive Heart Failure in his sister; Diabetes in his brother and mother; Heart disease in his brother and father; Stroke in his brother.  Wt Readings from Last 3 Encounters:  10/31/18 (!) 329 lb 12.8 oz (149.6 kg)  10/01/18 (!) 336 lb (152.4 kg)  09/18/18 (!) 338 lb (153.3 kg)    PHYSICAL EXAM BP 130/78   Pulse 86   Ht 5\' 10"  (1.778 m)   Wt (!) 329 lb 12.8 oz (149.6 kg)   SpO2 93%   BMI 47.32  kg/m  Physical Exam  Constitutional: He is oriented to person, place, and time. He appears well-developed. No distress.  Morbidly obese gentleman.  Chronically ill-appearing.  Occasionally uses oxygen.  Short of breath at baseline.  Slightly disheveled.  HENT:  Head: Normocephalic and atraumatic.  Mouth/Throat: Oropharynx is clear and moist.  Neck: Normal range of motion. Neck supple. Hepatojugular reflux and JVD (Roughly 8 cmH2O) present. Carotid bruit is not present.  Cardiovascular: Normal rate, S1 normal and S2 normal. An irregularly irregular rhythm present. PMI is not displaced (Cannot really palpate). Exam reveals distant heart sounds and decreased pulses (Bilateral pedal pulses reduced). Exam reveals no gallop and no friction rub.  Murmur (Soft musical HSM) heard. Pulmonary/Chest:  Mild baseline accessory muscle use, but normal work of breathing.  Respiratory distress.  Mild expiratory wheeze but no frank rales or rhonchi.  Abdominal: Soft. Bowel sounds are normal. There is no tenderness. There is no rebound.  Musculoskeletal: Normal range of motion. He exhibits edema (1-2+ lower extremity).  Neurological: He is alert and oriented to person, place, and time.  Psychiatric: He has a normal mood and affect. His behavior is normal. Judgment and thought content normal.  Vitals reviewed.   Adult ECG Report Not checked  Other studies Reviewed: Additional studies/ records that were reviewed today include:  Recent Labs:  [facility] May 30, 2017.  No labs available since.  (Apparently were checked by PCP -in [facility]   ASSESSMENT / PLAN: Problem List Items Addressed This Visit    Chronic  combined systolic and diastolic CHF, NYHA class 3 (HCC) - Primary (Chronic)    Mostly euvolemic.  Mild pedal edema.  Currently taking less Lasix when compared to May.  He is down to 40 mg in the morning with an additional 20 as needed (had been on 80 mg twice daily). --Suspect improved diuresis with being on  Entresto. Follow-up echo with stable EF 30 to 35% with clear diastolic dysfunction but not able to detect it because of A. fib.  Plan:   Increase Entresto to max dose 97-103 mg; did not tolerate spironolactone.    Did not tolerate spironolactone  Continue current doses of Lasix with initial 40 mg morning with 20 mg as needed  Currently on 12 and half twice daily of carvedilol.  Suspect that we can probably titrate up, but currently titrating up Entresto.  Will hold off until next visit.  Is also on digoxin.  I did not broach the topic of considering ICD at this time, but we did talk about last visit.  Would be something to consider once we made final titrations of medications.      Relevant Medications   sacubitril-valsartan (ENTRESTO) 97-103 MG   furosemide (LASIX) 20 MG tablet   Essential hypertension (Chronic)    Blood pressure pretty controlled.  Has room to titrate up Entresto with Plan to consider further titration up of carvedilol based on blood pressures in follow-up visits.  Is also on spironolactone.      Relevant Medications   sacubitril-valsartan (ENTRESTO) 97-103 MG   furosemide (LASIX) 20 MG tablet   Hyperlipidemia with target LDL less than 70 (Chronic)    I have not seen labs in quite some time.  We need to get fasting lipid panel and chemistries in December.      Relevant Medications   sacubitril-valsartan (ENTRESTO) 97-103 MG   furosemide (LASIX) 20 MG tablet   Other Relevant Orders   Lipid panel   Comprehensive metabolic panel   Ischemic cardiomyopathy (Chronic)    Multivessel disease with other risk factors as well including obesity, hypertension, hyperlipidemia and A. fib. Continue to titrate up Entresto dose.  Next option will be carvedilol.  Is on low-dose digoxin and on reduced dose of diuretic.  Continue current diuretic regimen. Unfortunately follow-up echo did not show improved EF.  May need to consider referral for ICD.      Relevant  Medications   sacubitril-valsartan (ENTRESTO) 97-103 MG   furosemide (LASIX) 20 MG tablet   Morbid obesity (HCC) (Chronic)    Slow weight loss.  Continue BiPAP for sleep apnea.  Discussed importance of increasing diet control.  Continue exercise as tolerated.      Persistent atrial fibrillation (HCC): CHA2DS2Vasc 6 (Chronic)    Rate now better controlled with combination of amiodarone, carvedilol and digoxin. Now on reduced dose Eliquis without any bleeding issues.      Relevant Medications   sacubitril-valsartan (ENTRESTO) 97-103 MG   furosemide (LASIX) 20 MG tablet    Other Visit Diagnoses    Medication monitoring encounter       Relevant Orders   Comprehensive metabolic panel      I spent a total of 30 minutes with the patient and chart review. >  50% of the time was spent in direct patient consultation.   Current medicines are reviewed at length with the patient today.  (+/- concerns) n/a The following changes have been made:  see below  Patient Instructions  Medication Instructions:   COMPLETE ENTRESTO  49/51  THEN START  ENTRESTO 97/103 ONE TABLET TWICE  A DAY.   MAY TAKE AN ADDITIONAL 20 MG OF LASIX IF NEED FOR SWELLING  NOTIFY OFFICE IF YOU USE NITROGLYCERIN TABLET MORE THAN TWICE A WEEK  If you need a refill on your cardiac medications before your next appointment, please call your pharmacy.   Lab work: Lipid  cmp-- DO LABS IN DEC 2019 If you have labs (blood work) drawn today and your tests are completely normal, you will receive your results only by: Marland Kitchen MyChart Message (if you have MyChart) OR . A paper copy in the mail If you have any lab test that is abnormal or we need to change your treatment, we will call you to review the results.  Testing/Procedures: Not needed  Follow-Up: At [facility], you and your health needs are our priority.  As part of our continuing mission to provide you with exceptional heart care, we have created designated Provider  Care Teams.  These Care Teams include your primary Cardiologist (physician) and Advanced Practice Providers (APPs -  Physician Assistants and Nurse Practitioners) who all work together to provide you with the care you need, when you need it. . Your physician recommends that you schedule a follow-up appointment in 3 months with [facility] . Your physician recommends that you schedule a follow-up appointment in: 6 month with DR Allon Costlow. .   Any Other Special Instructions Will Be Listed Below (If Applicable).    Studies Ordered:   Orders Placed This Encounter  Procedures  . Lipid panel  . Comprehensive metabolic panel      Bryan Lemma, M.D., M.S. Interventional Cardiologist   Pager # [phone] Phone # [phone] [address] [place], [place] [address]   Thank you for choosing Heartcare at [facility]!!

## 2018-10-31 NOTE — Patient Instructions (Addendum)
Medication Instructions:   COMPLETE ENTRESTO 49/51  THEN START  ENTRESTO 97/103 ONE TABLET TWICE  A DAY.   MAY TAKE AN ADDITIONAL 20 MG OF LASIX IF NEED FOR SWELLING  NOTIFY OFFICE IF YOU USE NITROGLYCERIN TABLET MORE THAN TWICE A WEEK  If you need a refill on your cardiac medications before your next appointment, please call your pharmacy.   Lab work: Lipid  cmp-- DO LABS IN DEC 2019 If you have labs (blood work) drawn today and your tests are completely normal, you will receive your results only by: Marland Kitchen MyChart Message (if you have MyChart) OR . A paper copy in the mail If you have any lab test that is abnormal or we need to change your treatment, we will call you to review the results.  Testing/Procedures: Not needed  Follow-Up: At [facility], you and your health needs are our priority.  As part of our continuing mission to provide you with exceptional heart care, we have created designated Provider Care Teams.  These Care Teams include your primary Cardiologist (physician) and Advanced Practice Providers (APPs -  Physician Assistants and Nurse Practitioners) who all work together to provide you with the care you need, when you need it. . Your physician recommends that you schedule a follow-up appointment in 3 months with [facility] . Your physician recommends that you schedule a follow-up appointment in: 6 month with DR HARDING. .   Any Other Special Instructions Will Be Listed Below (If Applicable).

## 2018-11-02 ENCOUNTER — Other Ambulatory Visit: Payer: Self-pay

## 2018-11-02 NOTE — Patient Outreach (Signed)
[facility]) Care Management  11/02/2018  Charles Mckinney Apr 28, 1951 [id number]   Follow up call to [name] regarding social work referral for financial assistance to purchase kerosene for heating.  [name] has now received assistance from [employer] and [employer].  He reported that he now has enough kerosene to heat his home for the winter.  BSW is closing case at this time.   Malachy Chamber, BSW Social Worker [phone]

## 2018-11-06 ENCOUNTER — Encounter: Payer: Self-pay | Admitting: Cardiology

## 2018-11-06 ENCOUNTER — Other Ambulatory Visit: Payer: Self-pay | Admitting: *Deleted

## 2018-11-06 NOTE — Assessment & Plan Note (Signed)
Rate now better controlled with combination of amiodarone, carvedilol and digoxin. Now on reduced dose Eliquis without any bleeding issues.

## 2018-11-06 NOTE — Assessment & Plan Note (Signed)
Mostly euvolemic.  Mild pedal edema.  Currently taking less Lasix when compared to May.  He is down to 40 mg in the morning with an additional 20 as needed (had been on 80 mg twice daily). --Suspect improved diuresis with being on Entresto. Follow-up echo with stable EF 30 to 35% with clear diastolic dysfunction but not able to detect it because of A. fib.  Plan:   Increase Entresto to max dose 97-103 mg; did not tolerate spironolactone.    Did not tolerate spironolactone  Continue current doses of Lasix with initial 40 mg morning with 20 mg as needed  Currently on 12 and half twice daily of carvedilol.  Suspect that we can probably titrate up, but currently titrating up Entresto.  Will hold off until next visit.  Is also on digoxin.  I did not broach the topic of considering ICD at this time, but we did talk about last visit.  Would be something to consider once we made final titrations of medications.

## 2018-11-06 NOTE — Assessment & Plan Note (Signed)
Multivessel disease with other risk factors as well including obesity, hypertension, hyperlipidemia and A. fib. Continue to titrate up Entresto dose.  Next option will be carvedilol.  Is on low-dose digoxin and on reduced dose of diuretic.  Continue current diuretic regimen. Unfortunately follow-up echo did not show improved EF.  May need to consider referral for ICD.

## 2018-11-06 NOTE — Assessment & Plan Note (Signed)
I have not seen labs in quite some time.  We need to get fasting lipid panel and chemistries in December.

## 2018-11-06 NOTE — Assessment & Plan Note (Signed)
Blood pressure pretty controlled.  Has room to titrate up Entresto with Plan to consider further titration up of carvedilol based on blood pressures in follow-up visits.  Is also on spironolactone.

## 2018-11-06 NOTE — Assessment & Plan Note (Signed)
Slow weight loss.  Continue BiPAP for sleep apnea.  Discussed importance of increasing diet control.  Continue exercise as tolerated.

## 2018-11-06 NOTE — Patient Outreach (Signed)
[place] [facility]) Care Management  11/06/2018  DARELL SAPUTO 04-04-1951 [id number]   Call placed to member to follow up on current health status.  No answer, HIPAA compliant voice message left.  Call received immediately back from member.  He report he is still having some shortness of breath but is now getting out more with his son going to the gym.  State he has been doing aerobics and his cardiologist was impressed during his last visit (11/27) that he has lost about 8 pounds over the last month.  He was advised to continue exercising to improve strength and breathing.    Member report that he has switched primary MD, this care manager is unsure if new MD is within the [facility] network.  Will have care management assistant check eligibility.  Will follow up with member within the next 2 weeks.  If he remain eligible and stable, will consider transition to health coach.  If no longer eligible, will close case at that time.  [facility] CM Care Plan Problem One     Most Recent Value  Care Plan Problem One  Knowledge deficit regarding importance of adherence to heart failure management  Role Documenting the Problem One  Care Management [place] for Problem One  Active  THN Long Term Goal   Member will verbalize understanding of yellow zone from heart failure action plan within the next 45 days  [facility]N Long Term Goal Start Date  09/21/18  Interventions for Problem One Long Term Goal  Reviewed heart failure zones with member.  [facility] CM Short Term Goal #1   Member will weigh self daily over the next 4 weeks  [facility] CM Short Term Goal #1 Start Date  11/06/18 [Not met, date reset]  Interventions for Short Term Goal #1  Advised of importance of daily monitoring.  Discussed alternative options for weighing and being able to read scale (use of flashlight)  [facility] CM Short Term Goal #2   Member will report compliance of medications, especially lasix, over the next 4 weeks  [facility]N CM Short Term Goal #2  Start Date  09/21/18  [facility] CM Short Term Goal #2 Met Date  11/06/18  [facility] CM Short Term Goal #3  Member will keep and attend appointment with cardiologist within the next 4 weeks  [facility]N CM Short Term Goal #3 Start Date  10/01/18  [facility] CM Short Term Goal #3 Met Date  11/06/18     Valente David, RN, MSN [place] Manager [phone]

## 2018-11-08 ENCOUNTER — Ambulatory Visit: Payer: Medicare Other

## 2018-11-14 ENCOUNTER — Ambulatory Visit
Admission: RE | Admit: 2018-11-14 | Discharge: 2018-11-14 | Disposition: A | Discharge status: Home / Self Care | Payer: Medicare Other | Source: Ambulatory Visit | Attending: Internal Medicine | Admitting: Internal Medicine

## 2018-11-14 DIAGNOSIS — Z87891 Personal history of nicotine dependence: Secondary | ICD-10-CM

## 2018-11-16 ENCOUNTER — Other Ambulatory Visit: Payer: Self-pay | Admitting: *Deleted

## 2018-11-16 NOTE — Patient Outreach (Signed)
[facility]) Care Management  11/16/2018  Charles Mckinney 04/09/1951 [id number]   After discussion with care management assistant, noted that member's new primary MD is not within the [facility] network.  Member no longer eligible for services.  Call placed to member for case closure, no answer.  HIPAA compliant voice message left.  Will close case at this time.  Will send case closure letter to both member and primary MD.  Kemper Durie, RN, MSN [facility] Care Management  [facility] Manager [phone]

## 2018-11-19 ENCOUNTER — Other Ambulatory Visit: Payer: Self-pay | Admitting: Internal Medicine

## 2018-11-19 DIAGNOSIS — I714 Abdominal aortic aneurysm, without rupture, unspecified: Secondary | ICD-10-CM

## 2018-11-20 ENCOUNTER — Telehealth: Payer: Self-pay | Admitting: Cardiology

## 2018-11-20 NOTE — Telephone Encounter (Signed)
New Message         Patient states he was asked to do a C-scan and he is asking for a second opinion. Pls call and advise.

## 2018-11-20 NOTE — Telephone Encounter (Signed)
Left message to call back  

## 2018-11-21 ENCOUNTER — Other Ambulatory Visit: Payer: Medicare Other

## 2018-11-21 NOTE — Telephone Encounter (Signed)
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 Patient advised that his insurance wouldn't cover a repeat test so soon and that he may have to pay out of pocket for a repeat AAA duplex. Patient is asking for Dr. [name] to give his opinion since another test has been ordered CT abdomen and pelvis. Patient says that he can not afford this CT scan. Advised patient that message would be sent to his provider for further advice.

## 2018-11-22 NOTE — Telephone Encounter (Signed)
Spoke to patient . Patient states he does not have the money to due ct scan now, and wanted to know if another facility would be cheaper. " they are talking that I may need surgery , but I am not having any more surgery"  RN informed patient Dr [name] response.

## 2018-11-22 NOTE — Telephone Encounter (Signed)
It may be 7 they can do percutaneously and not open.  If that is the case, may not be a bad idea.  dh

## 2018-11-22 NOTE — Telephone Encounter (Signed)
He should be seeing Azalee Course in ~2-3 months - can discuss screening testing at that time.    Bryan Lemma, MD

## 2018-11-22 NOTE — Telephone Encounter (Signed)
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  If that is truly the case, then he probably should have that evaluated.  I do not know who ordered the CT scan, so I would discuss it with the person who ordered the study to begin with.   Bryan Lemma, MD

## 2018-11-26 NOTE — Telephone Encounter (Signed)
Spoke to patient- informed patient  Dr [name]  States she should follow through with CT SCAN.  RN informed patient to contact  Dr Berenda Morale office to let them know he does not have the co pay money ($75) for  Ct scan. There maybe way away to acces the funds.   The patient states he he dose not have the money he had to have [employer] to aid in the  [employer] and brother died, funeral is Apr 17, 2023.   patient states if this surgery is like the heart surgery ,he does not want to have it. The pain was to bad. RN informed patient  The doctor's will not know what is needed until patient has the second test completed, patient stated again can not afford it and he will worry about it after the holidays

## 2018-12-04 ENCOUNTER — Other Ambulatory Visit: Payer: Self-pay | Admitting: Internal Medicine

## 2018-12-20 ENCOUNTER — Other Ambulatory Visit: Payer: Self-pay | Admitting: Family Medicine

## 2018-12-20 DIAGNOSIS — I5022 Chronic systolic (congestive) heart failure: Secondary | ICD-10-CM

## 2018-12-28 ENCOUNTER — Ambulatory Visit (HOSPITAL_COMMUNITY)
Admission: EM | Admit: 2018-12-28 | Discharge: 2018-12-28 | Disposition: A | Discharge status: Home / Self Care | Payer: Medicare Other | Attending: Internal Medicine | Admitting: Internal Medicine

## 2018-12-28 ENCOUNTER — Encounter (HOSPITAL_COMMUNITY): Payer: Self-pay | Admitting: Emergency Medicine

## 2018-12-28 DIAGNOSIS — R197 Diarrhea, unspecified: Secondary | ICD-10-CM

## 2018-12-28 NOTE — ED Triage Notes (Signed)
C/o diarrhea onset 0600 today and sts he has not been able to keep anything down.... sx also include weakness and fatigue  Taking pepto bismol w/no relief.   Hx of CHF and afib   A&O x4... NAD.

## 2018-12-28 NOTE — ED Provider Notes (Signed)
[facility]    CSN: [id number] Arrival date & time: 12/28/18  1324     History   Chief Complaint Chief Complaint  Patient presents with  . Diarrhea    HPI Charles Mckinney is a 68 y.o. male with a history of hypertension-controlled, hyperlipidemia on statins and ischemic cardiomyopathy, stable comes to the urgent care for diarrhea of 1 day duration.  Diarrhea started this morning.  He had 2 nonbloody diarrheal bowel movements.  Diarrhea was relieved by taking Pepto-Bismol.  Not associated with nausea or vomiting.  No known aggravating factors.  Patient denies any change in dietary habits.  No recent travel.  HPI  Past Medical History:  Diagnosis Date  . Anxiety   . 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 --> CABG x 4: LIMA-LAD, SVG-Diag, SeqSVG-rPL-rPDA --> Intra-Op TEE showed EF of 30% with only mild MR.   . 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  Suggestion of anterior infarct on Myoview/echo.  Marland Kitchen. GERD (gastroesophageal reflux disease)   . Hyperlipidemia   . Hypertension   . Hypertensive heart disease    Mild to moderately dilated LV with mild LVH.  . Ischemic cardiomyopathy 01/2014   EF 30-35%.  Severe multivessel CAD noted on cath--CABG x4 -> Myoview (June 2018) shows large anterior-septal-apical infarct with no ischemia.  . Kidney stones   . Obesity (BMI 30-39.9)   . Osteoarthritis   . Permanent atrial fibrillation    On Amiodarone & Beta Blocker (on Xarelto)  . PONV (postoperative nausea and vomiting)     Patient Active Problem List   Diagnosis Date Noted  . Ischemic cardiomyopathy 02/01/2018  . OSA (obstructive sleep apnea) 10/04/2017  . Essential hypertension 05/30/2017  . Gout 05/30/2017  . Chest pain 05/29/2017    . CKD (chronic kidney disease), stage III (HCC) 05/29/2017  . TIA (transient ischemic attack) 04/26/2017  . Hypokalemia 04/26/2017  . Prolonged QT interval 04/26/2017  . Coronary artery disease involving native coronary artery of native heart with angina pectoris (HCC) 02/02/2016  . 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 Chronic combined systolic and diastolic CHF, NYHA class 3 (HCC) 01/19/2015  . Long term current use of anticoagulant therapy 01/19/2015  . Persistent atrial fibrillation (HCC): CHA2DS2Vasc 6   . GERD 11/22/2007  . Hyperlipidemia with target LDL less than 70   . Morbid obesity (HCC)     Past Surgical History:  Procedure Laterality Date  . CARDIAC CATHETERIZATION N/A 04/09/2015   Procedure: Right/Left Heart Cath and Coronary Angiography;  Surgeon: Orpah CobbAjay Kadakia, MD;  [facility]: [facility] IN[facility] CUPID::  LM 95%; ostLAD 99% - p-dLAD 80%, D1 99% - D2 80%; OCx 90%,pCx 50%; pRCA 80%, mRCA 50%, rPDA 80% & 90%.  . CARDIOVERSION N/A 01/19/2015   Procedure: CARDIOVERSION;  Surgeon: Othella BoyerWilliam S Tilley, MD;  [facility];  Service: Cardiovascular;  Laterality: N/A;  pt in Afib, 12:22 synched cardioversion @  120 joules using Propofol 100 mg,IV....unsuccessful, repeated at 200 joules  . CARDIOVERSION N/A 04/02/2015   Procedure: CARDIOVERSION;  Surgeon: Orpah CobbAjay Kadakia, MD;  [facility];  Service: Cardiovascular;  Laterality: N/A;  . CATARACT EXTRACTION    . CLIPPING OF ATRIAL APPENDAGE  04/13/2015  Procedure: CLIPPING OF ATRIAL APPENDAGE;  Surgeon: Alleen Borne, MD;  Location: [facility] OR;  Service: Open Heart Surgery;;  . CORONARY ARTERY BYPASS GRAFT N/A 04/13/2015   Procedure: CORONARY ARTERY BYPASS GRAFTING (CABG);  Surgeon: Alleen Borne, MD;  Location: [facility] OR;  Service: Open Heart Surgery;; LIMA-LAD, SVG-Diag, SeqSVG-rPL-rPDA   . NM MYOVIEW LTD  05/2017   EF 27%.  No reversible ischemia.  Large anterior-apical and septal infarct.  Diffuse LV HK and moderate LV dilation.  . TEE WITHOUT  CARDIOVERSION N/A 04/13/2015   Procedure: TRANSESOPHAGEAL ECHOCARDIOGRAM (TEE);  Surgeon: Alleen Borne, MD;  Location: [facility] OR;  Service: Open Heart Surgery;  Laterality: N/A;  . TRANSTHORACIC ECHOCARDIOGRAM  2/'15, 2/'16, 4/'16   a) EF 30-35%.  Mild LVH.  GRII DD.  Mild LA dilation.  Moderate diastolic dysfunction. b) Technically difficult.  Mild LVH.  EF 35%.  Diffuse HK.  Moderate LA dilation.  Mildly dilated RV with mildly reduced function.  c)   Mild concentric LVH.  EF 30-35%, diffuse HK.  Severe HK of mid apical inferior wall.  Moderate LA dilation.  . TRANSTHORACIC ECHOCARDIOGRAM N/A 1/18, 5/18, 7/18   a) Moderate severely reduced LV.  EF 30 of 35% with diffuse HK.  Mild MR.  Mildly dilated RA.;; b) (In setting of TIA).  Technically difficult mild to moderate globally reduced LV function.  Mild LVH.  Mild MR.  EF estimated 40-45%.  Moderate LA dilation.;; c)  Afib. Mild to mod dilated LV with mild LVH. EF 30-35% with diffuse HK. Mildly dilated RV with mildly decrease Systolic Fxn. Mild MR       Home Medications    Prior to Admission medications   Medication Sig Start Date End Date Taking? Authorizing Provider  amiodarone (PACERONE) 200 MG tablet TAKE 1 TABLET(200 MG) BY MOUTH DAILY 09/21/18  Yes Roderick Pee, MD  apixaban (ELIQUIS) 2.5 MG TABS tablet Take 1 tablet (2.5 mg total) by mouth 2 (two) times daily. 01/03/18  Yes [name], MD  carvedilol (COREG) 12.5 MG tablet Take 1 tablet (12.5 mg total) by mouth 2 (two) times daily with a meal. 07/03/18  Yes Azalee Course, PA  Cholecalciferol (VITAMIN D PO) Take 1 tablet by mouth daily.   Yes [provider]  colchicine 0.6 MG tablet Take 0.6 mg by mouth 2 (two) times daily as needed.   Yes [provider]  DIGOX 125 MCG tablet Take 1 tablet (0.125 mg total) by mouth daily. 07/17/18  Yes Meng, Wynema Birch, PA  fluticasone (FLONASE) 50 MCG/ACT nasal spray Place 1 spray into both nostrils daily.   Yes [provider]    Fluticasone-Salmeterol (ADVAIR DISKUS) 250-50 MCG/DOSE AEPB Inhale 1 puff into the lungs 2 (two) times daily. 06/13/18  Yes [place], Betty G, MD  furosemide (LASIX) 20 MG tablet Take 3 tablets (60 mg total) by mouth daily. May take an additional 20 mg tablet as needed daily for increase swelling 10/31/18 01/29/19 Yes Marykay Lex, MD  nitroGLYCERIN (NITROSTAT) 0.4 MG SL tablet Place 0.4 mg under the tongue every 5 (five) minutes as needed for chest pain.   Yes [provider]  potassium chloride SA (K-DUR,KLOR-CON) 20 MEQ tablet Take 1 tablet (20 mEq total) by mouth daily. 07/24/18  Yes Meng, Wynema Birch, PA  pravastatin (PRAVACHOL) 40 MG tablet TAKE 1 TABLET(40 MG) BY MOUTH DAILY 06/15/18  Yes [place], Betty G, MD  sacubitril-valsartan (ENTRESTO) 97-103 MG Take 1 tablet by mouth 2 (two) times daily.  10/31/18  Yes Marykay Lex, MD  albuterol [facility] HFA) [phone] Base) MCG/ACT inhaler INHALE 1 PUFF INTO THE LUNGS EVERY 6 HOURS AS NEEDED FOR WHEEZING OR SHORTNESS OF BREATH 10/31/17   [name], MD  albuterol (PROVENTIL) (2.5 MG/3ML) 0.083% nebulizer solution USE ONE VIAL USING NEBULIZER EVERY 6 (SIX) HOURS AS NEEDED FOR WHEEZING OR SHORTNESS OF BREATH. 06/19/18   [name], MD  allopurinol (ZYLOPRIM) 100 MG tablet TAKE 2 TABLETS(200 MG) BY MOUTH DAILY 09/21/18   Roderick Pee, MD    Family History Family History  Problem Relation Age of Onset  . Diabetes Mother   . Heart disease Father   . Congestive Heart Failure Sister   . Stroke Brother   . Heart disease Brother   . Diabetes Brother     Social History Social History   Tobacco Use  . Smoking status: Former Smoker    Last attempt to quit: 2000    Years since quitting: 20.0  . Smokeless tobacco: Never Used  Substance Use Topics  . Alcohol use: No  . Drug use: No     Allergies   Niacin and related; Xarelto [rivaroxaban]; and Penicillins   Review of Systems Review of Systems  Constitutional: Positive  for activity change, appetite change and fatigue. Negative for fever.  HENT: Negative for postnasal drip and sore throat.   Respiratory: Negative for chest tightness and shortness of breath.   Gastrointestinal: Positive for abdominal pain. Negative for abdominal distention, nausea and vomiting.  Endocrine: Negative for cold intolerance and heat intolerance.  Musculoskeletal: Negative for arthralgias and joint swelling.  Allergic/Immunologic: Negative for environmental allergies and food allergies.  Hematological: Negative for adenopathy.     Physical Exam Triage Vital Signs ED Triage Vitals  Enc Vitals Group     BP 12/28/18 1415 138/78     Pulse Rate 12/28/18 1415 64     Resp 12/28/18 1415 18     Temp 12/28/18 1415 (!) 97.2 F (36.2 C)     Temp src --      SpO2 12/28/18 1415 95 %     Weight --      Height --      Head Circumference --      Peak Flow --      Pain Score 12/28/18 1417 0     Pain Loc --      Pain Edu? --      Excl. in GC? --    No data found.  Updated Vital Signs BP 138/78 (BP Location: Left Arm)   Pulse 64 Comment: Hx of afib   Temp (!) 97.2 F (36.2 C)   Resp 18   SpO2 95%   Visual Acuity Right Eye Distance:   Left Eye Distance:   Bilateral Distance:    Right Eye Near:   Left Eye Near:    Bilateral Near:     Physical Exam Constitutional:      Appearance: Normal appearance. He is obese.  HENT:     Head: Normocephalic.     Right Ear: Tympanic membrane normal.     Left Ear: Tympanic membrane normal.     Nose: No rhinorrhea.     Mouth/Throat:     Mouth: Mucous membranes are moist.     Pharynx: No posterior oropharyngeal erythema.  Eyes:     Conjunctiva/sclera: Conjunctivae normal.     Pupils: Pupils are equal, round, and reactive to light.  Neck:     Musculoskeletal:  Normal range of motion and neck supple.  Cardiovascular:     Rate and Rhythm: Normal rate and regular rhythm.     Pulses: Normal pulses.  Abdominal:     General: Bowel  sounds are normal. There is no distension.     Palpations: There is no mass.     Tenderness: There is no abdominal tenderness. There is no guarding or rebound.  Neurological:     Mental Status: He is alert.      UC Treatments / Results  Labs (all labs ordered are listed, but only abnormal results are displayed) Labs Reviewed - No data to display  EKG None  Radiology No results found.  Procedures Procedures (including critical care time)  Medications Ordered in UC Medications - No data to display  Initial Impression / Assessment and Plan / UC Course  I have reviewed the triage vital signs and the nursing notes.  Pertinent labs & imaging results that were available during my care of the patient were reviewed by me and considered in my medical decision making (see chart for details).     1.  Acute diarrhea: Symptomatic treatment OTC Prilosec Encourage electrolyte balanced fluid intake Pepto-Bismol as needed Patient has prolonged QTC so Zofran/Phenergan is not a good treatment option  2.Hypertension: Stable  3.Ischemic cardiomyopathy: Continue guideline directed medical therapy  Final Clinical Impressions(s) / UC Diagnoses   Final diagnoses:  None   Discharge Instructions   None    ED Prescriptions    None     Controlled Substance Prescriptions [place] Controlled Substance Registry consulted? No   Merrilee Jansky, MD 12/28/18 [phone]

## 2019-01-16 ENCOUNTER — Other Ambulatory Visit: Payer: Self-pay | Admitting: Cardiology

## 2019-01-16 DIAGNOSIS — E785 Hyperlipidemia, unspecified: Secondary | ICD-10-CM

## 2019-03-06 ENCOUNTER — Telehealth: Payer: Self-pay | Admitting: Cardiology

## 2019-03-06 NOTE — Telephone Encounter (Signed)
Patient is requesting an appointment and prefers a telephone visit. Advised that this message would be sent to his provider and nurse.  Advised patient if his symptoms get worse, he needed to go to the ED for an evaluation.

## 2019-03-06 NOTE — Telephone Encounter (Signed)
  Patient states someone called him regarding scheduling a visit with Dr [name]. Please return call.

## 2019-03-06 NOTE — Telephone Encounter (Signed)
Follow up    Pt is calling because he would like to schedule an appointment to see the doctor and not wait to see him  He said he has been trying to get an appointment since January.    Please call

## 2019-03-06 NOTE — Telephone Encounter (Signed)
   Cardiac Questionnaire:    Since your last visit or hospitalization:    1. Have you been having new or worsening chest pain? No   2. Have you been having new or worsening shortness of breath? Yes 3. Have you been having new or worsening leg swelling, wt gain, or increase in abdominal girth (pants fitting more tightly)? No   4. Have you had any passing out spells? No     _____   COVID-19 Pre-Screening Questions:  . Do you currently have a fever? No . Have you recently travelled on a cruise, internationally, or to [place], [place], [place], [place], [place], or [place], [place] [employer]) ? No . Have you been in contact with someone that is currently pending confirmation of Covid19 testing or has been confirmed to have the Covid19 virus? No . Are you currently experiencing fatigue or cough? Yes and thinks its from fluid in his lungs

## 2019-03-07 NOTE — Telephone Encounter (Signed)
  Patient is calling back regarding a telephone visit. Please call

## 2019-03-07 NOTE — Telephone Encounter (Signed)
He was originally supposed to be a 3 month f/u with Mr. Azalee Course (should have been Feb-March) then to see me in May.  We are currently trying to get appointments figured out -- will see what we can do about setting up a Virtual Visit appointment.   I am still trying to scrub my existing patients & trying to understand changes to our schedules due to changes in our In-patient coverage.  Will look into this over the next few work days.  Bryan Lemma, MD

## 2019-03-11 ENCOUNTER — Other Ambulatory Visit: Payer: Self-pay | Admitting: Family Medicine

## 2019-03-11 DIAGNOSIS — R0600 Dyspnea, unspecified: Secondary | ICD-10-CM

## 2019-03-14 ENCOUNTER — Encounter: Payer: Self-pay | Admitting: Cardiology

## 2019-03-14 ENCOUNTER — Telehealth (INDEPENDENT_AMBULATORY_CARE_PROVIDER_SITE_OTHER): Payer: Medicare Other | Admitting: Cardiology

## 2019-03-14 DIAGNOSIS — I714 Abdominal aortic aneurysm, without rupture, unspecified: Secondary | ICD-10-CM

## 2019-03-14 DIAGNOSIS — I4819 Other persistent atrial fibrillation: Secondary | ICD-10-CM

## 2019-03-14 DIAGNOSIS — I25119 Atherosclerotic heart disease of native coronary artery with unspecified angina pectoris: Secondary | ICD-10-CM | POA: Diagnosis not present

## 2019-03-14 DIAGNOSIS — I5042 Chronic combined systolic (congestive) and diastolic (congestive) heart failure: Secondary | ICD-10-CM

## 2019-03-14 DIAGNOSIS — Z7901 Long term (current) use of anticoagulants: Secondary | ICD-10-CM

## 2019-03-14 DIAGNOSIS — E785 Hyperlipidemia, unspecified: Secondary | ICD-10-CM

## 2019-03-14 DIAGNOSIS — I255 Ischemic cardiomyopathy: Secondary | ICD-10-CM

## 2019-03-14 HISTORY — DX: Abdominal aortic aneurysm, without rupture, unspecified: I71.40

## 2019-03-14 HISTORY — DX: Abdominal aortic aneurysm, without rupture: I71.4

## 2019-03-14 NOTE — Assessment & Plan Note (Addendum)
I need to see labs to know where we stand.  Is on pravastatin, but may need more.  Need to get labs forwarded by PCP.  It is very difficult for him to get out and about.  Therefore I would like to just have him get his labs checked once.  Was post to have lipids checked in December, but never had them checked.

## 2019-03-14 NOTE — Assessment & Plan Note (Signed)
Doing better on Eliquis.  Based on him having significant bleeding issues on Xarelto I think we will keep on lower dose.

## 2019-03-14 NOTE — Assessment & Plan Note (Addendum)
EF of 30 to 35% with diastolic dysfunction (unable to quantitate secondary to A. fib)  Relatively recent exacerbation with worsening edema.    As I mentioned in previous visit, he should take extra Lasix if this happens, but he clearly did not understand. At this point I think he is fine taking the 60 mg in the morning with additional 20 to 40 mg in the afternoon pending level of his edema.  I also told him that he needs to have someone help him out with evaluating his weight in order to know whether he should increase his diuretic dosing.  He can see the numbers on the scale so I told him that when his sons help him out with bringing food they should help him weigh himself.  Initially his level of diuretic requirement went down with Entresto being increased to max dose. Did not tolerate spironolactone in the past. Have tentatively increase his Lasix with additional PRN doses again discussed. He is on digoxin.  Levels need to be checked when he is having labs done by PCP.  Was not interested in ICD discussion.

## 2019-03-14 NOTE — Progress Notes (Signed)
Virtual Visit via Telephone Note   This visit type was conducted due to national recommendations for restrictions regarding the COVID-19 Pandemic (e.g. social distancing) in an effort to limit this patient's exposure and mitigate transmission in our community.  Due to his co-morbid illnesses, this patient is at least at moderate risk for complications without adequate follow up.  This format is felt to be most appropriate for this patient at this time.  The patient did not have access to video technology/had technical difficulties with video requiring transitioning to audio format only (telephone).  All issues noted in this document were discussed and addressed.  No physical exam could be performed with this format.  Please refer to the patient's chart for his  consent to telehealth for [facility] (See Below -was a work in visit today.).    Evaluation Performed:  Follow-up visit  Date:  03/14/2019   ID:  Charles Mckinney, DOB July 16, 1951, MRN [id number]  Patient Location: Home  Provider Location: Home  PCP:  Charles Lobo, MD  Cardiologist:  Charles Lemma, MD  Electrophysiologist:  None   Chief Complaint:  ~ 5 month f/u  History of Present Illness:    Charles Mckinney is a 68 y.o. male who presents via audio/video conferencing for a telehealth visit today.    Charles Mckinney is a 68 y.o. male with a PMH of ICM/CAD-CABG with long-standing Afib along with HTN,HLD - OSA but not on CPAP) who presents today for ~5 month f/u.  He also has AAA (5.7 cm)  I saw him for the 1st visit in Feb 2019 -- > converted to ARB with plan to start [facility].  Added digoxin for CHF & Afib Rate control.    I last saw him in November 2019 following an echocardiogram showing persistent EF of 30 to 35%.  He stated he is doing relatively well.  Occasionally (twice a month) would take nitroglycerin for chest pain.  Not necessarily associate biventricular activity.  Chronic orthopnea (recliner at 45  degrees).  Chronic edema with support stockings. --> Doing better with Entresto.  Not using as much Lasix.  No sensation of A. fib.  INTERVAL HISTORY  Charles Mckinney called in insistent upon being evaluated sooner than originally scheduled because of worsening edema and rattling in his chest.  He was actually doing relatively well until about couple weeks ago he started having more swelling in his feet and a "rattling sensation in his chest ".  He was seen by his PCP who told him to increase his Lasix (as was indicated in my last note for as needed reasons) he is currently taking a total of 60 mg Lasix in the morning and 20 in the afternoon.  Symptoms have improved some but he is noticing that he is peeing a lot more.  This swelling that is been having muscular but worse than it had when I saw him in November.  He did not indicate to me at all that he is wearing support stockings or not however. Also, he cannot see the numbers on his scale to build know what he is weighing.  He has chronic orthopnea and is unable to lie flat, still sleeps in his recliner.  He still thinks he is little more short breath than usual because he is having a lot of nasal congestion and is hoping that with a bit Benadryl and other medications his PCP prescribed including nasal spray, that this will improve as well.  As long as  he sleeps in a recliner, he does not really have any orthopnea symptoms.  Overall things are otherwise stable.  He says he still has some off-and-on chest discomfort here near with rest or exertion, but is not having to use any additional doses of not as well.  He denies any rapid heartbeats but does feel intermittent palpitations.  No syncope or near syncope.  No TIA or arm esophagitis.  No claudication.  He is more limited as far as any walking by various and sundry joint and muscle pains.   1 of the major things that he complained about during this visit was that at the time of our evaluation in November he  had had an abdominal ultrasound done (ordered by his PCP) which showed 5.7 mm abdominal aortic aneurysm and the plan was for him to undergo CTA.  He contacted our office to understand what he should do about these results and I did concur that he should have a CT angiogram of the abdomen done.  Apparently there was issues with affording the co-pay back in December.  As far as I understand, he was also referred to vascular surgery, but he never responded back because he was concerned about them wanting to "cut him "needed and he could live through it.  He never followed through with this.  What he tells me is that he contacted our office on multiple occasions and never heard back and therefore was quite upset that if he had heard a note about the possible percutaneous options he would have followed through with this and it would have been done. I do not see anywhere in our charting that would say that he called anytime after December.  We spent about 15 out of the 30 minutes talking today discussing this very issue.  ROS:  Please see the history of present illness.     The patient does not have symptoms concerning for COVID-19 infection (fever, chills, cough, or new shortness of breath).  Symptoms are not new.  Has chronic dyspnea  Review of Systems  Constitutional: Positive for malaise/fatigue (Chronic). Negative for chills and fever.  HENT: Positive for congestion (runny nose).   Eyes:       Almost blind  Respiratory: Positive for cough (before taking lasix & cough meds/inhaler + benadryl), shortness of breath (chronic) and wheezing. Negative for sputum production.   Cardiovascular: Positive for orthopnea (Chronic).  Gastrointestinal: Positive for abdominal pain and diarrhea.       Went to UC - took Pepto  Genitourinary: Negative for hematuria.  Musculoskeletal: Positive for back pain and joint pain (knees).       Really unable to get around  Neurological: Positive for weakness (global).  Negative for dizziness.       Poor balance  Endo/Heme/Allergies: Positive for environmental allergies.  Psychiatric/Behavioral: Positive for memory loss. The patient is nervous/anxious.   All other systems reviewed and are negative.   Past Medical History:  Diagnosis Date  . Anxiety   . 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 --> CABG x 4: LIMA-LAD, SVG-Diag, SeqSVG-rPL-rPDA --> Intra-Op TEE showed EF of 30% with only mild MR.   . 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  Suggestion of anterior  infarct on Myoview/echo.  Marland Kitchen GERD (gastroesophageal reflux disease)   . Hyperlipidemia   . Hypertension   . Hypertensive heart disease    Mild to moderately dilated LV with mild LVH.  . Ischemic cardiomyopathy 01/2014   EF 30-35%.  Severe multivessel CAD noted on cath--CABG x4 -> Myoview (June 2018) shows large anterior-septal-apical infarct with no ischemia.  . Kidney stones   . Obesity (BMI 30-39.9)   . Osteoarthritis   . Permanent atrial fibrillation    On Amiodarone & Beta Blocker (on Xarelto)  . PONV (postoperative nausea and vomiting)    Past Surgical History:  Procedure Laterality Date  . CARDIAC CATHETERIZATION N/A 04/09/2015   Procedure: Right/Left Heart Cath and Coronary Angiography;  Surgeon: Orpah Cobb, MD;  Location: [facility] INVASIVE CV LAB CUPID::  LM 95%; ostLAD 99% - p-dLAD 80%, D1 99% - D2 80%; OCx 90%,pCx 50%; pRCA 80%, mRCA 50%, rPDA 80% & 90%.  . CARDIOVERSION N/A 01/19/2015   Procedure: CARDIOVERSION;  Surgeon: Othella Boyer, MD;  Location: [facility] ENDOSCOPY;  Service: Cardiovascular;  Laterality: N/A;  pt in Afib, 12:22 synched cardioversion @  120 joules using Propofol 100 mg,IV....unsuccessful, repeated at 200 joules  . CARDIOVERSION N/A  04/02/2015   Procedure: CARDIOVERSION;  Surgeon: Orpah Cobb, MD;  Location[facility] ENDOSCOPY;  Service: Cardiovascular;  Laterality: N/A;  . CATARACT EXTRACTION    . CLIPPING OF ATRIAL APPENDAGE  04/13/2015   Procedure: CLIPPING OF ATRIAL APPENDAGE;  Surgeon: Alleen Borne, MD;  Location: [facility] OR;  Service: Open Heart Surgery;;  . CORONARY ARTERY BYPASS GRAFT N/A 04/13/2015   Procedure: CORONARY ARTERY BYPASS GRAFTING (CABG);  Surgeon: Alleen Borne, MD;  Location: [facility] OR;  Service: Open Heart Surgery;; LIMA-LAD, SVG-Diag, SeqSVG-rPL-rPDA   . NM MYOVIEW LTD  05/2017   EF 27%.  No reversible ischemia.  Large anterior-apical and septal infarct.  Diffuse LV HK and moderate LV dilation.  . TEE WITHOUT CARDIOVERSION N/A 04/13/2015   Procedure: TRANSESOPHAGEAL ECHOCARDIOGRAM (TEE);  Surgeon: Alleen Borne, MD;  Location: [facility] OR;  Service: Open Heart Surgery;  Laterality: N/A;  . TRANSTHORACIC ECHOCARDIOGRAM  2/'15, 2/'16, 4/'16   a) EF 30-35%.  Mild LVH.  GRII DD.  Mild LA dilation.  Moderate diastolic dysfunction. b) Technically difficult.  Mild LVH.  EF 35%.  Diffuse HK.  Moderate LA dilation.  Mildly dilated RV with mildly reduced function.  c)   Mild concentric LVH.  EF 30-35%, diffuse HK.  Severe HK of mid apical inferior wall.  Moderate LA dilation.  . TRANSTHORACIC ECHOCARDIOGRAM N/A 1/18, 5/18, 7/18   a) Moderate severely reduced LV.  EF 30 of 35% with diffuse HK.  Mild MR.  Mildly dilated RA.;; b) (In setting of TIA).  Technically difficult mild to moderate globally reduced LV function.  Mild LVH.  Mild MR.  EF estimated 40-45%.  Moderate LA dilation.;; c)  Afib. Mild to mod dilated LV with mild LVH. EF 30-35% with diffuse HK. Mildly dilated RV with mildly decrease Systolic Fxn. Mild MR     R&L HCMay 5, 2016: Pre-CABG LM 95%; ostLAD 99% - p-dLAD 80%, D1 99% - D2 80%; OCx 90%,pCx 50%; pRCA 80%, mRCA 50%, rPDA 80% & 90%. --> CABG x 4: LIMA-LAD, SVG-Diag, SeqSVG-rPL-rPDA, LAA clipping.      Current  Meds  Medication Sig  . albuterol (PROAIR HFA) 108 (90 Base) MCG/ACT inhaler INHALE 1 PUFF INTO THE LUNGS EVERY 6 HOURS AS NEEDED FOR WHEEZING OR SHORTNESS OF BREATH  .  albuterol (PROVENTIL) (2.5 MG/3ML) 0.083% nebulizer solution USE ONE VIAL USING NEBULIZER EVERY 6 (SIX) HOURS AS NEEDED FOR WHEEZING OR SHORTNESS OF BREATH.  Marland Kitchen. allopurinol (ZYLOPRIM) 100 MG tablet TAKE 2 TABLETS(200 MG) BY MOUTH DAILY  . amiodarone (PACERONE) 200 MG tablet TAKE 1 TABLET(200 MG) BY MOUTH DAILY  . apixaban (ELIQUIS) 2.5 MG TABS tablet Take 1 tablet (2.5 mg total) by mouth 2 (two) times daily.  . carvedilol (COREG) 12.5 MG tablet Take 1 tablet (12.5 mg total) by mouth 2 (two) times daily with a meal.  . Cholecalciferol (VITAMIN D PO) Take 1 tablet by mouth daily.  . colchicine 0.6 MG tablet Take 0.6 mg by mouth 2 (two) times daily as needed.  Marland Kitchen. DIGOX 125 MCG tablet Take 1 tablet (0.125 mg total) by mouth daily.  . fluticasone (FLONASE) 50 MCG/ACT nasal spray Place 1 spray into both nostrils daily.  . Fluticasone-Salmeterol (ADVAIR DISKUS) 250-50 MCG/DOSE AEPB Inhale 1 puff into the lungs 2 (two) times daily.  . furosemide (LASIX) 20 MG tablet Take 3 tablets (60 mg total) by mouth daily. May take an additional 20 mg tablet as needed daily for increase swelling  . nitroGLYCERIN (NITROSTAT) 0.4 MG SL tablet Place 0.4 mg under the tongue every 5 (five) minutes as needed for chest pain.  . potassium chloride SA (K-DUR,KLOR-CON) 20 MEQ tablet Take 1 tablet (20 mEq total) by mouth daily.  . pravastatin (PRAVACHOL) 40 MG tablet TAKE ONE TABLET BY MOUTH ONCE DAILY  . sacubitril-valsartan (ENTRESTO) 97-103 MG Take 1 tablet by mouth 2 (two) times daily.   -- taking 60 mg AM & 20 mg PM Lasix  Now taking Benadryl, nasal spray  Allergies:   Niacin and related; Xarelto [rivaroxaban]; and Penicillins   Social History   Tobacco Use  . Smoking status: Former Smoker    Last attempt to quit: 2000    Years since quitting: 20.2   . Smokeless tobacco: Never Used  Substance Use Topics  . Alcohol use: No  . Drug use: No     Family Hx: The patient's family history includes Congestive Heart Failure in his sister; Diabetes in his brother and mother; Heart disease in his brother and father; Stroke in his brother.   Prior CV studies:   The following studies were reviewed today:  Echocardiogram October 18 2018: EF remains 30-35%.  Complicated by A. fib.  Poor quality echo.  Diffuse hypokinesis.  Unchanged from 2018.  Labs/Other Tests and Data Reviewed:    EKG:  No ECG reviewed.  Recent Labs: 07/17/2018: BUN 27; Creatinine, Ser 1.39; Potassium 4.5; Sodium 144   Recent Lipid Panel -- need labs from PCP (Jan-Feb) Lab Results  Component Value Date/Time   CHOL 191 05/30/2017 05:10 AM   TRIG 247 (H) 05/30/2017 05:10 AM   TRIG 243 (HH) 09/12/2006 01:09 PM   HDL 43 05/30/2017 05:10 AM   CHOLHDL 4.4 05/30/2017 05:10 AM   LDLCALC 99 05/30/2017 05:10 AM   LDLDIRECT 164.8 09/12/2006 01:09 PM    Wt Readings from Last 3 Encounters:  10/31/18 (!) 329 lb 12.8 oz (149.6 kg)  10/01/18 (!) 336 lb (152.4 kg)  09/18/18 (!) 338 lb (153.3 kg)     Objective:    Vital Signs:  There were no vitals taken for this visit.  Patient is unable to see numbers on his blood pressure cuff or on scale.   Telephonically, he does not seem to be in any acute distress.  Very difficult to hear and  and speech is barely intelligible. Tends to perseverate on things, but otherwise normal mood.  Alert and oriented x3  ASSESSMENT & PLAN:    Problem List Items Addressed This Visit    AAA (abdominal aortic aneurysm) without rupture (HCC) (Chronic)    As noted, we spent a long time talking about this.  He never did follow through with getting the CT scan done, now we are under COVID-19 restrictions. I reiterated and stressed to him the importance of having this test done, because this is what would be needed for the vascular surgeons prior to being  seen in order to determine if he could to have endovascular procedure done.  Certainly he would be a very very high risk patient for a high risk surgery if this needed to be done as an open surgery and I do not think it would be approved. However endovascularly, if possibly doable, he should be okay.  I explained to him that we continue treating atherosclerotic disease with blood pressure and cholesterol.    I instructed him that we really need to get him in to get the CT scan done and have sent a referral into vascular surgery once COVID-19 restrictions are lifted.  He has very high risk for COVID-19.      Relevant Orders   Ambulatory referral to Vascular Surgery   Chronic combined systolic and diastolic CHF, NYHA class 3 (HCC) (Chronic)    EF of 30 to 35% with diastolic dysfunction (unable to quantitate secondary to A. fib)  Relatively recent exacerbation with worsening edema.    As I mentioned in previous visit, he should take extra Lasix if this happens, but he clearly did not understand. At this point I think he is fine taking the 60 mg in the morning with additional 20 to 40 mg in the afternoon pending level of his edema.  I also told him that he needs to have someone help him out with evaluating his weight in order to know whether he should increase his diuretic dosing.  He can see the numbers on the scale so I told him that when his sons help him out with bringing food they should help him weigh himself.  Initially his level of diuretic requirement went down with Entresto being increased to max dose. Did not tolerate spironolactone in the past. Have tentatively increase his Lasix with additional PRN doses again discussed. He is on digoxin.  Levels need to be checked when he is having labs done by PCP.  Was not interested in ICD discussion.      Coronary artery disease involving native coronary artery of native heart with angina pectoris (HCC) - Primary (Chronic)    Multivessel  CAD with ongoing stable angina.  He is on carvedilol and diuretic.  It seems like his pain is more related to volume levels.  At present, he did not mention any symptoms that would sound like worsening anginal symptoms, more distantly heart failure. Is on pravastatin which I am sure is not enough.  Unfortunate I do not have any labs nor have I ever had labs to fully assess.  Not on antiplatelet agent because of Eliquis. On stable dose of beta-blocker and Entresto.      Hyperlipidemia with target LDL less than 70 (Chronic)    I need to see labs to know where we stand.  Is on pravastatin, but may need more.  Need to get labs forwarded by PCP.  It is very difficult for him to  get out and about.  Therefore I would like to just have him get his labs checked once.  Was post to have lipids checked in December, but never had them checked.      Ischemic cardiomyopathy (Chronic)   Long term current use of anticoagulant therapy (Chronic)    Doing better on Eliquis.  Based on him having significant bleeding issues on Xarelto I think we will keep on lower dose.      Persistent atrial fibrillation (HCC): CHA2DS2Vasc 6 (Chronic)    On combination amiodarone, carvedilol and digoxin for rate/rhythm control.  I wonder if may be some of his recent exacerbation of heart failure was related to being in rapid A. fib, but he did not notice any rapid heart rates.  More likely related to dietary indiscretion with current covered restrictions limiting his diet.  Is on reduced dose Eliquis for anticoagulation based on renal function.          COVID-19 Education: The signs and symptoms of COVID-19 were discussed with the patient and how to seek care for testing (follow up with PCP or arrange E-visit).  The importance of social distancing was discussed today.  Time:   Today, I have spent 30 minutes with the patient with telehealth technology discussing the above problems.     Medication Adjustments/Labs  and Tests Ordered: Current medicines are reviewed at length with the patient today.  Concerns regarding medicines are outlined above.  Tests Ordered: Orders Placed This Encounter  Procedures  . Ambulatory referral to Vascular Surgery   Medication Changes: No orders of the defined types were placed in this encounter.   Medication Instructions:   CONTINUE WITH LASIX  60 MG IN THE MORNING AND 20 MG IN THE AFTERNOON  MAY TAKE AN ADDITIONAL 20 MG IF NEEDED.  Disposition:  Follow up Sept  Signed,  , MD  03/14/2019 7:28 PM    [facility]  ADDENDUM: CONSENT FOR TELE-HEALTH VISIT - PLEASE REVIEW   I hereby voluntarily request, consent and authorize [facility] and its employed or contracted physicians, physician assistants, nurse practitioners or other licensed health care professionals (the Practitioner), to provide me with telemedicine health care services (the "Services") as deemed necessary by the treating Practitioner.I acknowledge and consent to receive the Services by the Practitioner via telemedicine. I understand that the telemedicine visit will involve communicating with the Practitioner through live audiovisual communication technology and the disclosure of certain medical information by electronic transmission. I acknowledge that I have been given the opportunity to request an in-person assessment or other available alternative prior to the telemedicine visit and am voluntarily participating in the telemedicine visit.   I understand that I have the right to withhold or withdraw my consent to the use of telemedicine in the course of my care at any time, without affecting my right to future care or treatment, and that the Practitioner or I may terminate the telemedicine visit at any time. I understand that I have the right to inspect all information obtained and/or recorded in the course of the telemedicine visit and may receive copies of available  information for a reasonable fee. I understand that some of thepotential risksof receiving the Services via telemedicine include:  Delay or interruption in medical evaluation due to technological equipment failure or disruption;  Information transmitted may not be sufficient (e.g. poor resolution of images) to allow for appropriate medical decision making by the Practitioner; and/or  In rare instances, security protocols could fail,  causing a breach of personal health information.   Furthermore, I acknowledge that it is my responsibility to provide information about my medical history, conditions and care that is complete and accurate to the best of my ability. I acknowledge that Practitioner's advice, recommendations, and/or decision may be based on factors not within their control, such as incomplete or inaccurate data provided by me or distortions of diagnostic images or specimens that may result from electronic transmissions. I understand that the practice of medicine is not an exact science and that Practitioner makes no warranties or guarantees regarding treatment outcomes. I acknowledge that I will receive a copy of this consent concurrently upon execution via email to the email address I last provided but may also request a printed copy by calling the office of [facility].    I understand that my insurance will be billed for this visit.   I have read or had this consent read to me.  I understand the contents of this consent, which adequately explains the benefits and risks of the Services being provided via telemedicine.  I have been provided ample opportunity to ask questions regarding this consent and the Services and have had my questions answered to my satisfaction.  I give my informed consent for the services to be provided through the use of telemedicine in my medical care   By participating in this telemedicine visit I agree to the above.    Last read by [name] at  10:400 AM on 03/14/2019.

## 2019-03-14 NOTE — Assessment & Plan Note (Signed)
As noted, we spent a long time talking about this.  He never did follow through with getting the CT scan done, now we are under COVID-19 restrictions. I reiterated and stressed to him the importance of having this test done, because this is what would be needed for the vascular surgeons prior to being seen in order to determine if he could to have endovascular procedure done.  Certainly he would be a very very high risk patient for a high risk surgery if this needed to be done as an open surgery and I do not think it would be approved. However endovascularly, if possibly doable, he should be okay.  I explained to him that we continue treating atherosclerotic disease with blood pressure and cholesterol.    I instructed him that we really need to get him in to get the CT scan done and have sent a referral into vascular surgery once COVID-19 restrictions are lifted.  He has very high risk for COVID-19.

## 2019-03-14 NOTE — Assessment & Plan Note (Signed)
Multivessel CAD with ongoing stable angina.  He is on carvedilol and diuretic.  It seems like his pain is more related to volume levels.  At present, he did not mention any symptoms that would sound like worsening anginal symptoms, more distantly heart failure. Is on pravastatin which I am sure is not enough.  Unfortunate I do not have any labs nor have I ever had labs to fully assess.  Not on antiplatelet agent because of Eliquis. On stable dose of beta-blocker and Entresto.

## 2019-03-14 NOTE — Patient Instructions (Addendum)
Medication Instructions:   CONTINUE WITH LASIX  60 MG IN THE MORNING AND 20 MG IN THE AFTERNOON  MAY TAKE AN ADDITIONAL 20 MG IF NEEDED.  If you need a refill on your cardiac medications before your next appointment, please call your pharmacy.   Lab work: NOT NEEDED If you have labs (blood work) drawn today and your tests are completely normal, you will receive your results only by: Marland Kitchen MyChart Message (if you have MyChart) OR . A paper copy in the mail If you have any lab test that is abnormal or we need to change your treatment, we will call you to review the results.  Testing/Procedures:   ONCE  COVID IS LIFTED - NEED TO HAVE CT ANGIO OF ABDOMEN COMPLETED  Follow-Up: At [facility], you and your health needs are our priority.  As part of our continuing mission to provide you with exceptional heart care, we have created designated Provider Care Teams.  These Care Teams include your primary Cardiologist (physician) and Advanced Practice Providers (APPs -  Physician Assistants and Nurse Practitioners) who all work together to provide you with the care you need, when you need it. You will need a follow up appointment in 5 months SEPT 2020.  Please call our office 2 months in advance to schedule this appointment.  You may see Bryan Lemma, MD or one of the following Advanced Practice Providers on your designated Care Team:   Theodore Demark, PA-C . Joni Reining, DNP, ANP  You have been referred to  Vascular surgeons once ct angio of the abdomen is completed to discuss  aortic abd. aneurysm .  Any Other Special Instructions Will Be Listed Below (If Applicable)

## 2019-03-14 NOTE — Assessment & Plan Note (Signed)
On combination amiodarone, carvedilol and digoxin for rate/rhythm control.  I wonder if may be some of his recent exacerbation of heart failure was related to being in rapid A. fib, but he did not notice any rapid heart rates.  More likely related to dietary indiscretion with current covered restrictions limiting his diet.  Is on reduced dose Eliquis for anticoagulation based on renal function.

## 2019-03-20 NOTE — Telephone Encounter (Signed)
Patient appt was on 03/14/19

## 2019-05-03 ENCOUNTER — Other Ambulatory Visit: Payer: Self-pay

## 2019-05-03 ENCOUNTER — Emergency Department (HOSPITAL_COMMUNITY)
Admission: EM | Admit: 2019-05-03 | Discharge: 2019-05-05 | Disposition: A | Discharge status: Home / Self Care | Payer: Medicare Other | Attending: Emergency Medicine | Admitting: Emergency Medicine

## 2019-05-03 ENCOUNTER — Emergency Department (HOSPITAL_COMMUNITY): Payer: Medicare Other

## 2019-05-03 ENCOUNTER — Encounter (HOSPITAL_COMMUNITY): Payer: Self-pay

## 2019-05-03 DIAGNOSIS — I2510 Atherosclerotic heart disease of native coronary artery without angina pectoris: Secondary | ICD-10-CM | POA: Diagnosis not present

## 2019-05-03 DIAGNOSIS — I5042 Chronic combined systolic (congestive) and diastolic (congestive) heart failure: Secondary | ICD-10-CM | POA: Insufficient documentation

## 2019-05-03 DIAGNOSIS — Z79899 Other long term (current) drug therapy: Secondary | ICD-10-CM | POA: Insufficient documentation

## 2019-05-03 DIAGNOSIS — Z7409 Other reduced mobility: Secondary | ICD-10-CM | POA: Diagnosis not present

## 2019-05-03 DIAGNOSIS — M109 Gout, unspecified: Secondary | ICD-10-CM | POA: Insufficient documentation

## 2019-05-03 DIAGNOSIS — R269 Unspecified abnormalities of gait and mobility: Secondary | ICD-10-CM | POA: Diagnosis not present

## 2019-05-03 DIAGNOSIS — R0602 Shortness of breath: Secondary | ICD-10-CM | POA: Diagnosis present

## 2019-05-03 DIAGNOSIS — Z6841 Body Mass Index (BMI) 40.0 and over, adult: Secondary | ICD-10-CM | POA: Insufficient documentation

## 2019-05-03 DIAGNOSIS — K219 Gastro-esophageal reflux disease without esophagitis: Secondary | ICD-10-CM | POA: Diagnosis not present

## 2019-05-03 DIAGNOSIS — N183 Chronic kidney disease, stage 3 (moderate): Secondary | ICD-10-CM | POA: Diagnosis not present

## 2019-05-03 DIAGNOSIS — G4733 Obstructive sleep apnea (adult) (pediatric): Secondary | ICD-10-CM | POA: Diagnosis not present

## 2019-05-03 DIAGNOSIS — Z7901 Long term (current) use of anticoagulants: Secondary | ICD-10-CM | POA: Insufficient documentation

## 2019-05-03 DIAGNOSIS — Z8673 Personal history of transient ischemic attack (TIA), and cerebral infarction without residual deficits: Secondary | ICD-10-CM | POA: Insufficient documentation

## 2019-05-03 DIAGNOSIS — Z1159 Encounter for screening for other viral diseases: Secondary | ICD-10-CM | POA: Diagnosis not present

## 2019-05-03 DIAGNOSIS — Z951 Presence of aortocoronary bypass graft: Secondary | ICD-10-CM | POA: Insufficient documentation

## 2019-05-03 DIAGNOSIS — I130 Hypertensive heart and chronic kidney disease with heart failure and stage 1 through stage 4 chronic kidney disease, or unspecified chronic kidney disease: Secondary | ICD-10-CM | POA: Insufficient documentation

## 2019-05-03 DIAGNOSIS — Z87891 Personal history of nicotine dependence: Secondary | ICD-10-CM | POA: Diagnosis not present

## 2019-05-03 DIAGNOSIS — R69 Illness, unspecified: Secondary | ICD-10-CM

## 2019-05-03 LAB — CBC WITH DIFFERENTIAL/PLATELET
Abs Immature Granulocytes: 0.08 10*3/uL — ABNORMAL HIGH (ref 0.00–0.07)
Basophils Absolute: 0.1 10*3/uL (ref 0.0–0.1)
Basophils Relative: 1 %
Eosinophils Absolute: 0.3 10*3/uL (ref 0.0–0.5)
Eosinophils Relative: 5 %
HCT: 37.9 % — ABNORMAL LOW (ref 39.0–52.0)
Hemoglobin: 12.4 g/dL — ABNORMAL LOW (ref 13.0–17.0)
Immature Granulocytes: 1 %
Lymphocytes Relative: 16 %
Lymphs Abs: 0.9 10*3/uL (ref 0.7–4.0)
MCH: 32.1 pg (ref 26.0–34.0)
MCHC: 32.7 g/dL (ref 30.0–36.0)
MCV: 98.2 fL (ref 80.0–100.0)
Monocytes Absolute: 0.4 10*3/uL (ref 0.1–1.0)
Monocytes Relative: 7 %
Neutro Abs: 4.1 10*3/uL (ref 1.7–7.7)
Neutrophils Relative %: 70 %
Platelets: 193 10*3/uL (ref 150–400)
RBC: 3.86 MIL/uL — ABNORMAL LOW (ref 4.22–5.81)
RDW: 14.5 % (ref 11.5–15.5)
WBC: 5.9 10*3/uL (ref 4.0–10.5)
nRBC: 0 % (ref 0.0–0.2)

## 2019-05-03 LAB — COMPREHENSIVE METABOLIC PANEL
ALT: 42 U/L (ref 0–44)
AST: 45 U/L — ABNORMAL HIGH (ref 15–41)
Albumin: 3.5 g/dL (ref 3.5–5.0)
Alkaline Phosphatase: 41 U/L (ref 38–126)
Anion gap: 12 (ref 5–15)
BUN: 21 mg/dL (ref 8–23)
CO2: 27 mmol/L (ref 22–32)
Calcium: 8.9 mg/dL (ref 8.9–10.3)
Chloride: 103 mmol/L (ref 98–111)
Creatinine, Ser: 1.54 mg/dL — ABNORMAL HIGH (ref 0.61–1.24)
GFR calc Af Amer: 53 mL/min — ABNORMAL LOW (ref >60)
GFR calc non Af Amer: 46 mL/min — ABNORMAL LOW (ref >60)
Glucose, Bld: 164 mg/dL — ABNORMAL HIGH (ref 70–99)
Potassium: 3.2 mmol/L — ABNORMAL LOW (ref 3.5–5.1)
Sodium: 142 mmol/L (ref 135–145)
Total Bilirubin: 0.7 mg/dL (ref 0.3–1.2)
Total Protein: 5.9 g/dL — ABNORMAL LOW (ref 6.5–8.1)

## 2019-05-03 LAB — URINALYSIS, ROUTINE W REFLEX MICROSCOPIC
Bilirubin Urine: NEGATIVE
Glucose, UA: NEGATIVE mg/dL
Ketones, ur: NEGATIVE mg/dL
Nitrite: NEGATIVE
Protein, ur: 30 mg/dL — AB
Specific Gravity, Urine: 1.027 (ref 1.005–1.030)
pH: 5 (ref 5.0–8.0)

## 2019-05-03 LAB — PROTIME-INR
INR: 1.1 (ref 0.8–1.2)
Prothrombin Time: 13.7 seconds (ref 11.4–15.2)

## 2019-05-03 LAB — BRAIN NATRIURETIC PEPTIDE: B Natriuretic Peptide: 104.8 pg/mL — ABNORMAL HIGH (ref 0.0–100.0)

## 2019-05-03 LAB — TROPONIN I: Troponin I: <0.03 ng/mL (ref <0.03)

## 2019-05-03 LAB — SARS CORONAVIRUS 2 BY RT PCR (HOSPITAL ORDER, PERFORMED IN ~~LOC~~ HOSPITAL LAB): SARS Coronavirus 2: NEGATIVE

## 2019-05-03 MED ORDER — CARVEDILOL 12.5 MG PO TABS
12.5000 mg | ORAL_TABLET | Freq: Two times a day (BID) | ORAL | Status: DC
  Administered 2019-05-03 – 2019-05-05 (×4): 12.5 mg via ORAL
  Filled 2019-05-03 (×4): qty 1

## 2019-05-03 MED ORDER — POTASSIUM CHLORIDE CRYS ER 20 MEQ PO TBCR
20.0000 meq | EXTENDED_RELEASE_TABLET | Freq: Every day | ORAL | Status: DC
  Administered 2019-05-03 – 2019-05-05 (×3): 20 meq via ORAL
  Filled 2019-05-03 (×3): qty 1

## 2019-05-03 MED ORDER — APIXABAN 2.5 MG PO TABS
2.5000 mg | ORAL_TABLET | Freq: Two times a day (BID) | ORAL | Status: DC
  Administered 2019-05-03 – 2019-05-05 (×4): 2.5 mg via ORAL
  Filled 2019-05-03 (×4): qty 1

## 2019-05-03 MED ORDER — PRAVASTATIN SODIUM 40 MG PO TABS
40.0000 mg | ORAL_TABLET | Freq: Every day | ORAL | Status: DC
  Administered 2019-05-03 – 2019-05-05 (×3): 40 mg via ORAL
  Filled 2019-05-03 (×3): qty 1

## 2019-05-03 MED ORDER — DIGOXIN 125 MCG PO TABS
0.1250 mg | ORAL_TABLET | Freq: Every day | ORAL | Status: DC
  Administered 2019-05-03 – 2019-05-05 (×3): 0.125 mg via ORAL
  Filled 2019-05-03 (×3): qty 1

## 2019-05-03 NOTE — ED Notes (Signed)
Spoke with Jonny Ruiz from social work; waiting on consult from PT and/or hospice

## 2019-05-03 NOTE — ED Notes (Signed)
Dinner tray at bedside

## 2019-05-03 NOTE — Progress Notes (Addendum)
CSW completed assessment and created FL-2 with verbal permission from pt/pt's son and set referrals out via the hub.  PT consult to be completed on morning of 5/30.  EDP/RN updated.  2nd shift ED CSW will leave handoff for 1st shift ED CSW.  Pt also has been to [facility] in the past, per the notes and pt' son.  CSW will continue to follow for D/C needs.  Dorothe Pea. Kamarrion Stfort, LCSW, LCAS, CSI Transitions of Care Clinical Social Worker Care Coordination Department Ph: [phone]

## 2019-05-03 NOTE — ED Notes (Signed)
Heart healthy dinner tray ordered 

## 2019-05-03 NOTE — ED Provider Notes (Signed)
  Physical Exam  BP 130/84   Pulse 82   Temp 98.8 F (37.1 C) (Oral)   Resp 18   Ht 5\' 10"  (1.778 m)   Wt (!) 150.1 kg   SpO2 98%   BMI 47.49 kg/m   Physical Exam  ED Course/Procedures     Procedures  MDM  Patient care assumed at 3 pm.  Patient is here for failure to thrive and unable to care for himself.  Apparently family called hospice but hospice has not evaluated patient yet.  Patient does not meet inpatient criteria for admission.  Social work and physical therapy were consulted.  6:01 PM PT recommend nursing home placement. Social work also called hospice for evaluation. Patient will need placement either to nursing home or hospice. Will hold in the ED until that is sorted out. Restarted home meds        Charlynne Pander, MD 05/03/19 [phone]

## 2019-05-03 NOTE — ED Notes (Addendum)
PT at bedside and in communication with social work and hospice

## 2019-05-03 NOTE — Progress Notes (Signed)
CSW received a call from staff asking for updates from social work.   CSW reviewed chart and sees while CSW on 1st shift was consulted, the CSW from 1st shift was updated by Hospice that the pt will be assessed by Hospice at first opportunity at the request of family.  CSW spoke to PT who confirmed this with Hospice rep Wallis Bamberg.  CSW spoke with the [facility] ED TOC CM who is treaching out to [facility] with Hospice also.  Per the notes, pt's family is speaking to Hospice, but per pt's RN, pt is unaware.  CSW standing by to assist with sociasl work needs, once Hospice assesses pt.  CSW will continue to follow for D/C needs.  Dorothe Pea. Jailee Jaquez, LCSW, LCAS, CSI Transitions of Care Clinical Social Worker Care Coordination Department Ph: [phone]

## 2019-05-03 NOTE — Evaluation (Signed)
Physical Therapy Evaluation Patient Details Name: Charles Mckinney MRN: [id number] DOB: 12-09-1950 Today's Date: 05/03/2019   History of Present Illness  pt is a 68 y/o male admitted with SOB and weakness (unable to get out of his chair today.  pt had been trying to get assist at home, but came to ED before he met with hospice.  Ex wife called EMS and pt brought to ED.  PMH includes  HTN, ICM, OA (knees hips bil, gout, CKD, CAD, s/p CABG, OSA.  Clinical Impression  Pt admitted with/for worsening SOB and weakness.  Pt needing min assist to be safe as of the evaluation.  Pt currently limited functionally due to the problems listed. ( See problems list.)   Pt will benefit from PT to maximize function and safety in order to get ready for next venue listed below.     Follow Up Recommendations SNF    Equipment Recommendations  Other (comment)(TBA)    Recommendations for Other Services       Precautions / Restrictions Precautions Precautions: Fall(though pt says he hasn't fallen all the way to the floor.)      Mobility  Bed Mobility Overal bed mobility: Needs Assistance Bed Mobility: Supine to Sit;Sit to Supine     Supine to sit: HOB elevated;Min guard Sit to supine: Min guard;HOB elevated   General bed mobility comments: struggled, but no assist needed  Transfers Overall transfer level: Needs assistance Equipment used: Rolling walker (2 wheeled) Transfers: Sit to/from Stand;Lateral/Scoot Transfers Sit to Stand: Min assist        Lateral/Scoot Transfers: Min guard General transfer comment: cues for hand placement, knees buckling without warning, but pt catching himself with guarding/min assist  Ambulation/Gait Ambulation/Gait assistance: Min assist Gait Distance (Feet): 5 Feet(forward and back) Assistive device: Rolling walker (2 wheeled) Gait Pattern/deviations: Step-through pattern;Decreased step length - right;Decreased step length - left;Decreased stride length   Gait  velocity interpretation: <1.8 ft/sec, indicate of risk for recurrent falls General Gait Details: unsteady, abrupt buckling of legs with pain.  pt catches himself without complete collapse, but pt/therapist are neither confident in pt's steps.  Stairs            Wheelchair Mobility    Modified Rankin (Stroke Patients Only)       Balance Overall balance assessment: Needs assistance Sitting-balance support: No upper extremity supported Sitting balance-Leahy Scale: Good     Standing balance support: Single extremity supported;Bilateral upper extremity supported Standing balance-Leahy Scale: Poor Standing balance comment: reliant on stability assist                             Pertinent Vitals/Pain Pain Assessment: Faces Faces Pain Scale: Hurts whole lot Pain Location: knees/hips/feet. Pain Descriptors / Indicators: Sore;Grimacing;Guarding;Moaning Pain Intervention(s): Monitored during session;Limited activity within patient's tolerance    Home Living Family/patient expects to be discharged to:: Private residence Living Arrangements: Alone Available Help at Discharge: Available PRN/intermittently;Family(infrequent assist) Type of Home: House Home Access: Ramped entrance;Stairs to enter Entrance Stairs-Rails: Right;Left Entrance Stairs-Number of Steps: 3 Home Layout: One level Home Equipment: [place] - 4 wheels;Cane - quad(urinal)      Prior Function Level of Independence: Independent with assistive device(s);Needs assistance   Gait / Transfers Assistance Needed: Uses various AD that have become more useless whether too heavy or house too cluttered.  ADL's / Homemaking Assistance Needed: has trouble washing  (sink baths on toilet), cleaning after toileting)  Hand Dominance        Extremity/Trunk Assessment   Upper Extremity Assessment Upper Extremity Assessment: Generalized weakness    Lower Extremity Assessment Lower Extremity Assessment:  Generalized weakness       Communication   Communication: (quick, slurred speech.)  Cognition Arousal/Alertness: Awake/alert Behavior During Therapy: WFL for tasks assessed/performed Overall Cognitive Status: (NT formally)                                        General Comments      Exercises     Assessment/Plan    PT Assessment Patient needs continued PT services  PT Problem List Decreased strength;Decreased activity tolerance;Decreased balance;Decreased mobility;Decreased knowledge of use of DME;Cardiopulmonary status limiting activity;Obesity;Pain       PT Treatment Interventions      PT Goals (Current goals can be found in the Care Plan section)  Acute Rehab PT Goals Patient Stated Goal: get stronger so I can be safer at home,  get me some assist PT Goal Formulation: With patient Time For Goal Achievement: 05/17/19 Potential to Achieve Goals: Fair    Frequency Min 2X/week   Barriers to discharge Decreased caregiver support      Co-evaluation               AM-PAC PT "6 Clicks" Mobility  Outcome Measure Help needed turning from your back to your side while in a flat bed without using bedrails?: A Little Help needed moving from lying on your back to sitting on the side of a flat bed without using bedrails?: A Little Help needed moving to and from a bed to a chair (including a wheelchair)?: A Little Help needed standing up from a chair using your arms (e.g., wheelchair or bedside chair)?: A Little Help needed to walk in hospital room?: A Little Help needed climbing 3-5 steps with a railing? : A Lot 6 Click Score: 17    End of Session     Patient left: in bed;with call bell/phone within reach Nurse Communication: Mobility status PT Visit Diagnosis: Unsteadiness on feet (R26.81);Other abnormalities of gait and mobility (R26.89);Muscle weakness (generalized) (M62.81);Pain Pain - part of body: Arm;Knee(bil knees/hips/feet)    Time:  2951-8841 PT Time Calculation (min) (ACUTE ONLY): 36 min   Charges:   PT Evaluation $PT Eval Moderate Complexity: 1 Mod PT Treatments $Gait Training: 8-22 mins        05/03/2019  Donnella Sham, PT Acute Rehabilitation Services [phone]  (pager) [phone]  (office)  Tessie Fass Steve Youngberg 05/03/2019, 6:10 PM

## 2019-05-03 NOTE — Progress Notes (Signed)
CSW spoke to the pt who stated he would like SNF placement due to the pt's experiencing, per notes, "decreasing mobility since January".  Pt provider permission for the CSW to create a FL-2 and send referrals out via the hub to the [place] area.    CSW explained due to time-constraints and bed availability in the ED the pt would either have to D/C to the first SNF found, or to the pt's choice of the first available SNF(s) or D/C home to seek admission from there.  Pt voiced understanding.  Pt provided verbal permission to speak to the pt's son Hirving, Ellenbecker and family at ph: [phone] to see what pt's son who is a Education officer, environmental in this process, per the pt, hopes to see for the pt.  CSW will continue to follow for D/C needs.  Dorothe Pea. Hosey Burmester, LCSW, LCAS, CSI Transitions of Care Clinical Social Worker Care Coordination Department Ph: [phone]

## 2019-05-03 NOTE — NC FL2 (Signed)
[place] MEDICAID FL2 LEVEL OF CARE SCREENING TOOL     IDENTIFICATION  Patient Name: Charles Mckinney Birthdate: May 19, 1951 Sex: male Admission Date (Current Location): 05/03/2019  [facility] and [place] Number:  Producer, television/film/video and Address:  [facility], [address] [place], [place] [address]      Provider Number: [id number]  Attending Physician Name and Address:  Charlynne Pander, MD  Relative Name and Phone Number:       Current Level of Care: Hospital Recommended Level of Care: Skilled Nursing Facility Prior Approval Number:    Date Approved/Denied:   PASRR Number: [id number]  Discharge Plan:      Current Diagnoses: Patient Active Problem List   Diagnosis Date Noted  . AAA (abdominal aortic aneurysm) without rupture (HCC) 03/14/2019  . Ischemic cardiomyopathy 02/01/2018  . OSA (obstructive sleep apnea) 10/04/2017  . Essential hypertension 05/30/2017  . Gout 05/30/2017  . Chest pain 05/29/2017  . CKD (chronic kidney disease), stage III (HCC) 05/29/2017  . TIA (transient ischemic attack) 04/26/2017  . Hypokalemia 04/26/2017  . Prolonged QT interval 04/26/2017  . Coronary artery disease involving native coronary artery of native heart with angina pectoris (HCC) 02/02/2016  . 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 Chronic combined systolic and diastolic CHF, NYHA class 3 (HCC) 01/19/2015  . Long term current use of anticoagulant therapy 01/19/2015  . Persistent atrial fibrillation (HCC): CHA2DS2Vasc 6   . GERD 11/22/2007  . Hyperlipidemia with target LDL less than 70   . Morbid obesity (HCC)     Orientation RESPIRATION BLADDER Height & Weight     Self, Time, Situation, Place  Normal Continent Weight: (!) 331 lb (150.1 kg) Height:  5\' 10"  (177.8 cm)  BEHAVIORAL SYMPTOMS/MOOD NEUROLOGICAL BOWEL NUTRITION STATUS      Continent Diet(Heart Heslthy)  AMBULATORY STATUS COMMUNICATION OF NEEDS Skin   Limited Assist Verbally Normal                        Personal Care Assistance Level of Assistance  Bathing, Dressing Bathing Assistance: Limited assistance   Dressing Assistance: Limited assistance     Functional Limitations Info             SPECIAL CARE FACTORS FREQUENCY  PT (By licensed PT), OT (By licensed OT)     PT Frequency: 5 OT Frequency: 5            Contractures Contractures Info: Not present    Additional Factors Info  Allergies, Code Status Code Status Info: FULL CODE Allergies Info: Niacin And Related, Xarelto Rivaroxaban, Penicillins           Current Medications (05/03/2019):  This is the current hospital active medication list Current Facility-Administered Medications  Medication Dose Route Frequency Provider Last Rate Last Dose  . apixaban (ELIQUIS) tablet 2.5 mg  2.5 mg Oral BID Charlynne Pander, MD      . carvedilol (COREG) tablet 12.5 mg  12.5 mg Oral BID WC Charlynne Pander, MD   12.5 mg at 05/03/19 1811  . digoxin (LANOXIN) tablet 0.125 mg  0.125 mg Oral Daily Charlynne Pander, MD   0.125 mg at 05/03/19 1810  . potassium chloride SA (K-DUR) CR tablet 20 mEq  20 mEq Oral Daily Charlynne Pander, MD   20 mEq at 05/03/19 1812  . pravastatin (PRAVACHOL) tablet 40 mg  40 mg Oral Daily Charlynne Pander, MD   40 mg  at 05/03/19 1811   Current Outpatient Medications  Medication Sig Dispense Refill  . albuterol (PROAIR HFA) 108 (90 Base) MCG/ACT inhaler INHALE 1 PUFF INTO THE LUNGS EVERY 6 HOURS AS NEEDED FOR WHEEZING OR SHORTNESS OF BREATH 8.5 g 3  . albuterol (PROVENTIL) (2.5 MG/3ML) 0.083% nebulizer solution USE ONE VIAL USING NEBULIZER EVERY 6 (SIX) HOURS AS NEEDED FOR WHEEZING OR SHORTNESS OF BREATH. (Patient taking differently: Take 2.5 mg by nebulization every 6 (six) hours as needed for wheezing or shortness of breath. ) 150 mL 1  . allopurinol (ZYLOPRIM) 100 MG tablet TAKE 2 TABLETS(200 MG) BY MOUTH DAILY (Patient taking differently: Take 200 mg by mouth daily. ) 180 tablet 0   . amiodarone (PACERONE) 200 MG tablet TAKE 1 TABLET(200 MG) BY MOUTH DAILY (Patient taking differently: Take 200 mg by mouth daily. ) 90 tablet 0  . apixaban (ELIQUIS) 2.5 MG TABS tablet Take 1 tablet (2.5 mg total) by mouth 2 (two) times daily. 60 tablet 3  . carvedilol (COREG) 12.5 MG tablet Take 1 tablet (12.5 mg total) by mouth 2 (two) times daily with a meal. 60 tablet 2  . colchicine 0.6 MG tablet Take 0.6 mg by mouth 2 (two) times daily as needed (gout flare up).     . DIGOX 125 MCG tablet Take 1 tablet (0.125 mg total) by mouth daily. 30 tablet 6  . fluticasone (FLONASE) 50 MCG/ACT nasal spray Place 1 spray into both nostrils daily.    . furosemide (LASIX) 20 MG tablet Take 3 tablets (60 mg total) by mouth daily. May take an additional 20 mg tablet as needed daily for increase swelling (Patient taking differently: Take 20-40 mg by mouth See admin instructions. 40mg  in the am and 20mg  in the pm) 360 tablet 3  . nitroGLYCERIN (NITROSTAT) 0.4 MG SL tablet Place 0.4 mg under the tongue every 5 (five) minutes as needed for chest pain.    . potassium chloride SA (K-DUR,KLOR-CON) 20 MEQ tablet Take 1 tablet (20 mEq total) by mouth daily. 90 tablet 1  . pravastatin (PRAVACHOL) 40 MG tablet TAKE ONE TABLET BY MOUTH ONCE DAILY 90 tablet 1  . sacubitril-valsartan (ENTRESTO) 97-103 MG Take 1 tablet by mouth 2 (two) times daily. 60 tablet 7  . Cholecalciferol (VITAMIN D PO) Take 1 tablet by mouth daily.    . Fluticasone-Salmeterol (ADVAIR DISKUS) 250-50 MCG/DOSE AEPB Inhale 1 puff into the lungs 2 (two) times daily. (Patient not taking: Reported on 05/03/2019) 1 each 3     Discharge Medications: Please see discharge summary for a list of discharge medications.  Relevant Imaging Results:  Relevant Lab Results:   Additional Information [id number]  Dorothe Pea Avnoor Koury, LCSW

## 2019-05-03 NOTE — Care Management (Signed)
ED CM reviewed patient's record.  Patient is being evaluated  for SNF placement from [facility] ED.  PT eval performed recommendation for SNF placement.  Patient and family were made aware of  Hospice and Palliative supportive services that are available referral came from patient's PCP. Patient and family would like to see if him get better but if not they appear to be open to Hospice Supportive services.  TOC team will continue to follow and workup for SNF placement.

## 2019-05-03 NOTE — Progress Notes (Addendum)
CSW reviewed [place] MUST and see pt has a PASRR of: [id number] with an start date of: 0515/2016.  No end date.  CSW will continue to follow for D/C needs.  Dorothe Pea. Hewitt Garner, LCSW, LCAS, CSI Transitions of Care Clinical Social Worker Care Coordination Department Ph: [phone]

## 2019-05-03 NOTE — ED Notes (Signed)
Pt's watch in pt belongings bag

## 2019-05-03 NOTE — ED Provider Notes (Signed)
[facility] EMERGENCY DEPARTMENT Provider Note   CSN: [id number] Arrival date & time: 05/03/19  4401    History   Chief Complaint Chief Complaint  Patient presents with   Shortness of Breath    HPI Charles Mckinney is a 68 y.o. male.     HPI Patient reports that he is having a lot of trouble getting around in his home.  He reports that he is very short of breath when he tries to do anything.  He reports that he has a lot of pain into his hips and knees bilaterally.  Just trying to get to the bathroom and back is overwhelming for him.  He reports he has been talking to his doctor about getting in-home assistance and someone was supposed to come this week but that has not been arranged yet. 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 --> CABG x 4: LIMA-LAD, SVG-Diag, SeqSVG-rPL-rPDA --> Intra-Op TEE showed EF of 30% with only mild MR.    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  Suggestion of anterior infarct on Myoview/echo.   Congestive heart failure (CHF) (HCC)    GERD (gastroesophageal reflux disease)    Hyperlipidemia    Hypertension    Hypertensive heart disease    Mild to moderately dilated LV with mild LVH.   Ischemic cardiomyopathy 01/2014   EF 30-35%.  Severe multivessel CAD noted on cath--CABG x4 -> Myoview (June 2018) shows large anterior-septal-apical infarct with no ischemia.   Kidney stones    Obesity (BMI 30-39.9)    Osteoarthritis    Permanent atrial fibrillation    On Amiodarone & Beta Blocker (on Xarelto)   PONV (postoperative nausea and vomiting)     Patient Active Problem List   Diagnosis Date Noted   AAA (abdominal aortic aneurysm)  without rupture (HCC) 03/14/2019   Ischemic cardiomyopathy 02/01/2018   OSA (obstructive sleep apnea) 10/04/2017   Essential hypertension 05/30/2017   Gout 05/30/2017   Chest pain 05/29/2017   CKD (chronic kidney disease), stage III (HCC) 05/29/2017   TIA (transient ischemic attack) 04/26/2017   Hypokalemia 04/26/2017   Prolonged QT interval 04/26/2017   Coronary artery disease involving native coronary artery of native heart with angina pectoris (HCC) 02/02/2016   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   Chronic combined systolic and diastolic CHF, NYHA class 3 (HCC) 01/19/2015   Long term current use of anticoagulant therapy 01/19/2015   Persistent atrial fibrillation (HCC): CHA2DS2Vasc 6    GERD 11/22/2007   Hyperlipidemia with target LDL less than 70    Morbid obesity (HCC)     Past Surgical History:  Procedure Laterality Date   CARDIAC CATHETERIZATION N/A 04/09/2015   Procedure: Right/Left Heart Cath and Coronary Angiography;  Surgeon: Orpah Cobb, MD;  Location: [facility] INVASIVE CV LAB CUPID::  LM 95%; ostLAD 99% - p-dLAD 80%, D1 99% - D2 80%; OCx 90%,pCx 50%; pRCA 80%, mRCA 50%, rPDA 80% & 90%.   CARDIOVERSION N/A 01/19/2015   Procedure: CARDIOVERSION;  Surgeon: Othella Boyer, MD;  Location: [facility] ENDOSCOPY;  Service: Cardiovascular;  Laterality: N/A;  pt in Afib, 12:22 synched cardioversion @  120 joules using  Propofol 100 mg,IV....unsuccessful, repeated at 200 joules   CARDIOVERSION N/A 04/02/2015   Procedure: CARDIOVERSION;  Surgeon: Orpah CobbAjay Kadakia, MD;  Location: [facility] ENDOSCOPY;  Service: Cardiovascular;  Laterality: N/A;   CATARACT EXTRACTION     CLIPPING OF ATRIAL APPENDAGE  04/13/2015   Procedure: CLIPPING OF ATRIAL APPENDAGE;  Surgeon: Alleen BorneBryan K Bartle, MD;  Location: [facility] OR;  Service: Open Heart Surgery;;   CORONARY ARTERY BYPASS GRAFT N/A 04/13/2015   Procedure: CORONARY ARTERY BYPASS GRAFTING (CABG);  Surgeon: Alleen BorneBryan K Bartle, MD;  [facility];  Service: Open Heart Surgery;;  LIMA-LAD, SVG-Diag, SeqSVG-rPL-rPDA    NM MYOVIEW LTD  05/2017   EF 27%.  No reversible ischemia.  Large anterior-apical and septal infarct.  Diffuse LV HK and moderate LV dilation.   TEE WITHOUT CARDIOVERSION N/A 04/13/2015   Procedure: TRANSESOPHAGEAL ECHOCARDIOGRAM (TEE);  Surgeon: Alleen BorneBryan K Bartle, MD;  [facility];  Service: Open Heart Surgery;  Laterality: N/A;   TRANSTHORACIC ECHOCARDIOGRAM  2/'15, 2/'16, 4/'16   a) EF 30-35%.  Mild LVH.  GRII DD.  Mild LA dilation.  Moderate diastolic dysfunction. b) Technically difficult.  Mild LVH.  EF 35%.  Diffuse HK.  Moderate LA dilation.  Mildly dilated RV with mildly reduced function.  c)   Mild concentric LVH.  EF 30-35%, diffuse HK.  Severe HK of mid apical inferior wall.  Moderate LA dilation.   TRANSTHORACIC ECHOCARDIOGRAM N/A 1/18, 5/18, 7/18   a) Moderate severely reduced LV.  EF 30 of 35% with diffuse HK.  Mild MR.  Mildly dilated RA.;; b) (In setting of TIA).  Technically difficult mild to moderate globally reduced LV function.  Mild LVH.  Mild MR.  EF estimated 40-45%.  Moderate LA dilation.;; c)  Afib. Mild to mod dilated LV with mild LVH. EF 30-35% with diffuse HK. Mildly dilated RV with mildly decrease Systolic Fxn. Mild MR        Home Medications    Prior to Admission medications   Medication Sig Start Date End Date Taking? Authorizing Provider  albuterol (PROAIR HFA) 108 (90 Base) MCG/ACT inhaler INHALE 1 PUFF INTO THE LUNGS EVERY 6 HOURS AS NEEDED FOR WHEEZING OR SHORTNESS OF BREATH 10/31/17  Yes Gordy SaversKwiatkowski, Peter F, MD  albuterol (PROVENTIL) (2.5 MG/3ML) 0.083% nebulizer solution USE ONE VIAL USING NEBULIZER EVERY 6 (SIX) HOURS AS NEEDED FOR WHEEZING OR SHORTNESS OF BREATH. Patient taking differently: Take 2.5 mg by nebulization every 6 (six) hours as needed for wheezing or shortness of breath.  06/19/18  Yes Gordy SaversKwiatkowski, Peter F, MD  allopurinol (ZYLOPRIM) 100 MG tablet TAKE 2 TABLETS(200 MG) BY MOUTH DAILY Patient taking  differently: Take 200 mg by mouth daily.  09/21/18  Yes Roderick Peeodd, Jeffrey A, MD  amiodarone (PACERONE) 200 MG tablet TAKE 1 TABLET(200 MG) BY MOUTH DAILY Patient taking differently: Take 200 mg by mouth daily.  09/21/18  Yes Roderick Peeodd, Jeffrey A, MD  apixaban (ELIQUIS) 2.5 MG TABS tablet Take 1 tablet (2.5 mg total) by mouth 2 (two) times daily. 01/03/18  Yes Gordy SaversKwiatkowski, Peter F, MD  carvedilol (COREG) 12.5 MG tablet Take 1 tablet (12.5 mg total) by mouth 2 (two) times daily with a meal. 07/03/18  Yes Azalee CourseMeng, Hao, PA  colchicine 0.6 MG tablet Take 0.6 mg by mouth 2 (two) times daily as needed (gout flare up).    Yes [provider]  DIGOX 125 MCG tablet Take 1 tablet (0.125 mg total) by mouth daily. 07/17/18  Yes Meng, Wynema BirchHao, PA  fluticasone (FLONASE) 50  MCG/ACT nasal spray Place 1 spray into both nostrils daily.   Yes [provider]  furosemide (LASIX) 20 MG tablet Take 3 tablets (60 mg total) by mouth daily. May take an additional 20 mg tablet as needed daily for increase swelling Patient taking differently: Take 20-40 mg by mouth See admin instructions. 40mg  in the am and 20mg  in the pm 10/31/18 05/03/19 Yes Marykay Lex, MD  nitroGLYCERIN (NITROSTAT) 0.4 MG SL tablet Place 0.4 mg under the tongue every 5 (five) minutes as needed for chest pain.   Yes [provider]  potassium chloride SA (K-DUR,KLOR-CON) 20 MEQ tablet Take 1 tablet (20 mEq total) by mouth daily. 07/24/18  Yes Meng, Wynema Birch, PA  pravastatin (PRAVACHOL) 40 MG tablet TAKE ONE TABLET BY MOUTH ONCE DAILY 01/16/19  Yes Marykay Lex, MD  sacubitril-valsartan (ENTRESTO) 97-103 MG Take 1 tablet by mouth 2 (two) times daily. 10/31/18  Yes Marykay Lex, MD  Cholecalciferol (VITAMIN D PO) Take 1 tablet by mouth daily.    [provider]  Fluticasone-Salmeterol (ADVAIR DISKUS) 250-50 MCG/DOSE AEPB Inhale 1 puff into the lungs 2 (two) times daily. Patient not taking: Reported on 05/03/2019 06/13/18   [place], Betty G,  MD    Family History Family History  Problem Relation Age of Onset   Diabetes Mother    Heart disease Father    Congestive Heart Failure Sister    Stroke Brother    Heart disease Brother    Diabetes Brother     Social History Social History   Tobacco Use   Smoking status: Former Smoker    Last attempt to quit: 2000    Years since quitting: 20.4   Smokeless tobacco: Never Used  Substance Use Topics   Alcohol use: No   Drug use: No     Allergies   Niacin and related; Xarelto [rivaroxaban]; and Penicillins   Review of Systems Review of Systems 10 Systems reviewed and are negative for acute change except as noted in the HPI.  Physical Exam Updated Vital Signs BP 126/90    Pulse 81    Temp 98.8 F (37.1 C) (Oral)    Resp 16    Ht 5\' 10"  (1.778 m)    Wt (!) 150.1 kg    SpO2 94%    BMI 47.49 kg/m   Physical Exam Constitutional:      Comments: Alert and nontoxic.  No respiratory distress at rest.  Morbid obesity.  HENT:     Head: Normocephalic and atraumatic.     Mouth/Throat:     Pharynx: Oropharynx is clear.  Eyes:     Extraocular Movements: Extraocular movements intact.  Cardiovascular:     Rate and Rhythm: Normal rate and regular rhythm.     Comments: Heart sounds distant.  No gross rub murmur gallop.  Occasional ectopy. Pulmonary:     Comments: Lungs grossly clear.  No gross rales or rhonchi.  Sounds are soft to the bases.  Patient has very thick chest wall. Abdominal:     Comments: Abdomen is nontender.  Musculoskeletal:     Comments: 2+ pitting edema bilateral lower extremities.  Feet have pitting on the dorsum.  This is very symmetric.  Skin condition is good.  There are no areas of active wounds or significant cellulitis.  No effusions or redness around the knees or ankles.  Skin:    General: Skin is warm and dry.  Neurological:     General: No focal deficit present.  Mental Status: He is oriented to person, place, and time.      Coordination: Coordination normal.  Psychiatric:        Mood and Affect: Mood normal.      ED Treatments / Results  Labs (all labs ordered are listed, but only abnormal results are displayed) Labs Reviewed  COMPREHENSIVE METABOLIC PANEL - Abnormal; Notable for the following components:      Result Value   Potassium 3.2 (*)    Glucose, Bld 164 (*)    Creatinine, Ser 1.54 (*)    Total Protein 5.9 (*)    AST 45 (*)    GFR calc non Af Amer 46 (*)    GFR calc Af Amer 53 (*)    All other components within normal limits  BRAIN NATRIURETIC PEPTIDE - Abnormal; Notable for the following components:   B Natriuretic Peptide 104.8 (*)    All other components within normal limits  CBC WITH DIFFERENTIAL/PLATELET - Abnormal; Notable for the following components:   RBC 3.86 (*)    Hemoglobin 12.4 (*)    HCT 37.9 (*)    Abs Immature Granulocytes 0.08 (*)    All other components within normal limits  URINALYSIS, ROUTINE W REFLEX MICROSCOPIC - Abnormal; Notable for the following components:   APPearance HAZY (*)    Hgb urine dipstick SMALL (*)    Protein, ur 30 (*)    Leukocytes,Ua TRACE (*)    Bacteria, UA RARE (*)    All other components within normal limits  URINE CULTURE  TROPONIN I  PROTIME-INR    EKG EKG Interpretation  Date/Time:  Friday May 03 2019 10:00:03 EDT Ventricular Rate:  107 PR Interval:    QRS Duration: 146 QT Interval:  396 QTC Calculation: 516 R Axis:   -78 Text Interpretation:  Atrial fibrillation Ventricular premature complex Right bundle branch block Anterolateral infarct, age indeterminate old RBBB. no sig change from previous Confirmed by Arby Barrette [phone]) on 05/03/2019 10:35:48 AM   Radiology Dg Chest Port 1 View  Result Date: 05/03/2019 CLINICAL DATA:  Shortness of breath. Leg swelling. EXAM: PORTABLE CHEST 1 VIEW COMPARISON:  01/24/2018 FINDINGS: Sequelae of CABG and left atrial appendage clipping are again identified. The cardiac silhouette  remains enlarged. There is aortic atherosclerosis. Lung volumes are low. There is mild chronic prominence of the interstitial markings without overt edema, airspace consolidation, definite pleural effusion, or pneumothorax. No acute osseous abnormality is seen. IMPRESSION: Cardiomegaly and low lung volumes without evidence of acute airspace disease. Electronically Signed   By: Sebastian Ache M.D.   On: 05/03/2019 11:26    Procedures Procedures (including critical care time)  Medications Ordered in ED Medications - No data to display   Initial Impression / Assessment and Plan / ED Course  I have reviewed the triage vital signs and the nursing notes.  Pertinent labs & imaging results that were available during my care of the patient were reviewed by me and considered in my medical decision making (see chart for details).       Social work has been consulted.  At this time they are advising for OT PT evaluation.  May be difficult to place for today.  Patient is basic medical conditions appear to be stable.  Issue is that he reports he cannot manage in his home because he has so much hip and knee pain with basic instability and mobility limitations that he cannot go to the bathroom and get back to his bed or get  up to make food to feed himself.  Did discuss with the patient the fact that placement could take a significant amount of time.  Patient reports he does not feel that he can manage at home.. We will proceed with OT PT consults and attempts at placement.  Final Clinical Impressions(s) / ED Diagnoses   Final diagnoses:  Morbid obesity (HCC)  Severe comorbid illness  Impaired mobility and ADLs    ED Discharge Orders    None       Arby Barrette, MD 05/03/19 1525

## 2019-05-03 NOTE — ED Triage Notes (Signed)
Pt arrives to ED via EMS after reports of SOB w/ exertion causing increasing decreased mobility since Jan. EMS reports pt has CHF hx with BLE edema. EMS VS 124/93, HR 93, 20 RR, CBG 200, 92% ON RA.

## 2019-05-03 NOTE — ED Notes (Signed)
Patient denies pain and is resting comfortably.  

## 2019-05-03 NOTE — Progress Notes (Signed)
CSW called pt's son Jaxs, [place] and family at ph: [phone] and pt's son described that the pt detailed to the family today that pt had an extraordinarily hard time getting around and pt's family states they feel pt is not safe to be at home aone.  Per pt's son, pt lives alone and family cannot care for the pt due to working reasons and feared pt may have had a stroke due to pt's increased difficulty in speaking and in being understood.  Pt's son states another option available to the pt is the [facility] and that pt has D/C'd to the [place] from [facility] when opt had open heart surgery in the past, but is agreeable to the pt being referred out to SNF facilities in the [place] area at this time.  CSW will continue to follow for D/C needs.  Dorothe Pea. Lakedra Washington, LCSW, LCAS, CSI Transitions of Care Clinical Social Worker Care Coordination Department Ph: [phone]

## 2019-05-03 NOTE — TOC Initial Note (Signed)
Transition of Care [facility]) - Initial/Assessment Note    Patient Details  Name: Charles Mckinney MRN: [id number] Date of Birth: 11-Jun-1951  [facility] CM/SW Contact:    Mercy Riding, LCSW Phone Number: 05/03/2019, 10:05 PM  Clinical Narrative:     CSW spoke to the pt who stated he would like SNF placement due to the pt's experiencing, per notes, "decreasing mobility since January".  Pt provider permission for the CSW to create a FL-2 and send referrals out via the hub to the [place] area.    CSW explained due to time-constraints and bed availability in the ED the pt would either have to D/C to the first SNF found, or to the pt's choice of the first available SNF(s) or D/C home to seek admission from there.  Pt voiced understanding.  Pt provided verbal permission to speak to the pt's son Charles Mckinney, Charles Mckinney and family at ph: [phone] to see what pt's son who is a Education officer, environmental in this process, per the pt, hopes to see for the pt.  CSW called pt's sonIngold,Charles Ericand family at ph:[phone] and pt's son described that the pt detailed to the family today that pt had an extraordinarily hard time getting around and pt's family states they feel pt is not safe to be at home aone.  Per pt's son, pt lives alone and family cannot care for the pt due to working reasons and feared pt may have had a stroke due to pt's increased difficulty in speaking and in being understood.  Pt's son states another option available to the pt is the [facility] and that pt has D/C'd to the [place] from [facility] when opt had open heart surgery in the past, but is agreeable to the pt being referred out to SNF facilities in the [place] area at this time.  Expected Discharge Plan: Skilled Nursing Facility Barriers to Discharge: No Barriers Identified   Patient Goals and CMS Choice Patient states their goals for this hospitalization and ongoing recovery are:: (To "be able to get  and get around better".)      Expected Discharge Plan and Services Expected Discharge Plan: Skilled Nursing Facility       Living arrangements for the past 2 months: Single Family Home                                     Prior Living Arrangements/Services Living arrangements for the past 2 months: Single Family Home Lives with:: Self Patient language and need for interpreter reviewed:: Yes Do you feel safe going back to the place where you live?: No      Need for Family Participation in Patient Care: Yes (Comment) Care giver support system in place?: No (comment)      Activities of Daily Living      Permission Sought/Granted Permission sought to share information with : Facility Industrial/product designer granted to share information with : Yes, Verbal Permission Granted              Emotional Assessment     Affect (typically observed): Accepting, Adaptable, Calm, Stable Orientation: : Oriented to Self, Oriented to Situation, Oriented to Place, Fluctuating Orientation (Suspected and/or reported Sundowners), Oriented to  Time   Psych Involvement: No (comment)  Admission diagnosis:  Decreased Mobility Patient Active Problem List   Diagnosis Date Noted  . AAA (abdominal aortic aneurysm) without rupture (HCC) 03/14/2019  .  Ischemic cardiomyopathy 02/01/2018  . OSA (obstructive sleep apnea) 10/04/2017  . Essential hypertension 05/30/2017  . Gout 05/30/2017  . Chest pain 05/29/2017  . CKD (chronic kidney disease), stage III (HCC) 05/29/2017  . TIA (transient ischemic attack) 04/26/2017  . Hypokalemia 04/26/2017  . Prolonged QT interval 04/26/2017  . Coronary artery disease involving native coronary artery of native heart with angina pectoris (HCC) 02/02/2016  . 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 Chronic combined systolic and diastolic CHF, NYHA class 3 (HCC) 01/19/2015  . Long term current use of anticoagulant therapy 01/19/2015  . Persistent atrial  fibrillation (HCC): CHA2DS2Vasc 6   . GERD 11/22/2007  . Hyperlipidemia with target LDL less than 70   . Morbid obesity (HCC)    PCP:  Josiah Lobo, MD Pharmacy:   [facility] [phone] - [place], [place] - [address] AT [facility] [address] [place] [address] Phone: [phone] Fax: [phone]  [facility] - [place], [place] - [address] [place] [address] Phone: [phone] Fax: [phone]     Social Determinants of Health (SDOH) Interventions    Readmission Risk Interventions No flowsheet data found.

## 2019-05-03 NOTE — Progress Notes (Addendum)
Civil engineer, contracting [facility]) Hospice  Charles Mckinney was referred to [facility] yesterday by his PCP.  He has returned to the hospital prior to [facility] making contact and enrolling him in hospice services.  [facility] will follow and assist with anticipated discharge needs.  **Update, spoke with son Charles Mckinney, he confirms recent issues with SOB, wheezing and difficulty walking, states they felt he may have had a stroke.  They (son and ex wife) would like to have him evaluated to see if anything can be done to help him.  They are open to hospice support if that is the ultimate recommendation of the providers and of the pt.   Thank you, Wallis Bamberg RN, BSN, CCRN [name] Liaison (in [place] under Hospice and [facility]) [phone] (main #)

## 2019-05-03 NOTE — ED Notes (Signed)
Pt provided with food and beverage per Dr. Silverio Lay

## 2019-05-03 NOTE — Progress Notes (Addendum)
CSW received consult for patient for placement and completed chart review which revealed a note regarding a hospice referral. CSW spoke with Wallis Bamberg of [facility] to discuss this patient. Victorino Dike states that she will reach out to the patient's family to discuss and will return CSW call with more information. (Update: see [facility] hospice note)  CSW spoke with patient's RN Hannie to discuss plan for patient. RN stated that patient's test results had just been received and that she would call CSW with an update when available.  Edwin Dada, MSW, LCSW-A Clinical Social Worker [facility] [phone]

## 2019-05-03 NOTE — ED Notes (Signed)
Pt aware that a urine sample is needed; urinal at bedside 

## 2019-05-04 LAB — URINE CULTURE: Culture: <10000 — AB

## 2019-05-04 LAB — CBG MONITORING, ED: Glucose-Capillary: 139 mg/dL — ABNORMAL HIGH (ref 70–99)

## 2019-05-04 NOTE — ED Notes (Signed)
Patient is alert sitting in a chair in the doorway of his room patient is very difficult to understand when he talks.

## 2019-05-04 NOTE — ED Notes (Signed)
Joey, SW, in w/pt.  

## 2019-05-04 NOTE — ED Provider Notes (Signed)
BP 115/76 (BP Location: Right Arm)   Pulse (!) 53   Temp 98.1 F (36.7 C) (Oral)   Resp 16   Ht 5\' 10"  (1.778 m)   Wt (!) 150.1 kg   SpO2 95%   BMI 47.49 kg/m   1340: Chart, labs, VS reviewed.  Patient brought to ED for being unable to care for himself at home.  Medical evaluation revealed stable medical status.  CW and SW have been involved and consulted.  PT evaluated patient on 5/29, recommend SNF placement. Labs unremarkable. VS stable. Patient evaluated by me and he is sitting on his char, eating breakfast and lunch.  Pending SNF placement. COVID is negative on 5/29. [name] son updated by EMT.    Liberty Handy, PA-C 05/04/19 1345    Arby Barrette, MD 05/13/19 1436

## 2019-05-04 NOTE — ED Notes (Signed)
Encouraging pt to shower - pt declined - states he will shower tomorrow.

## 2019-05-04 NOTE — ED Notes (Signed)
Recliner moved to pts room for comfort. Pt states that he has a hard time sleeping in the bed and he likes to sleep in his recliner at home.

## 2019-05-04 NOTE — ED Notes (Signed)
Spoke with pt son concerning pts needs for his belongings. Son sts he is going to bring them to the main ED entrance for staff to give to pt. Son would like to speak with SW about plan.

## 2019-05-04 NOTE — Discharge Planning (Signed)
[facility] notes order for SNF placement.  Will await PT/OT recomendations.

## 2019-05-04 NOTE — ED Notes (Signed)
Pt sitting in recliner.  

## 2019-05-04 NOTE — ED Notes (Signed)
Pt noted to be sitting in recliner in room watching tv. Pt stated "I don't feel right. I need some coffee". After pt given coffee, sips it and says he feels better. Pt has done this twice earlier this RN's shift and was given Coke as requested. CBG was check w/first occurrence at 1040 - 139. Pt nad.

## 2019-05-04 NOTE — ED Notes (Signed)
Pt breakfast tray arrived. 

## 2019-05-04 NOTE — ED Notes (Signed)
Pt sitting in recliner talking on his cell phone.

## 2019-05-04 NOTE — ED Notes (Signed)
Breakfast ordered 

## 2019-05-04 NOTE — ED Notes (Signed)
Pt eating snack given. 

## 2019-05-04 NOTE — ED Notes (Signed)
Lunch tray ordered. Pt given snack. 

## 2019-05-04 NOTE — ED Notes (Signed)
Gave pt sandwich bag and drink along with cookies.

## 2019-05-05 MED ORDER — ALBUTEROL SULFATE HFA 108 (90 BASE) MCG/ACT IN AERS
2.0000 | INHALATION_SPRAY | Freq: Four times a day (QID) | RESPIRATORY_TRACT | Status: DC | PRN
  Administered 2019-05-05: 2 via RESPIRATORY_TRACT
  Filled 2019-05-05: qty 6.7

## 2019-05-05 NOTE — Progress Notes (Signed)
CSW met with patient and noted they selected [facility]. CSW notes they may have a bed today but if not will have one Monday. CSW will continue to follow to facilitate discharge.  Lamonte Richer, LCSW, [place] Worker II [phone]

## 2019-05-05 NOTE — ED Notes (Signed)
Lunch tray ordered 

## 2019-05-05 NOTE — ED Notes (Addendum)
PTAR has arrived to transport pt to [facility]. Pt voice he has all of his belongings incl cell phone. Pt unable to sign signature pad.

## 2019-05-05 NOTE — Progress Notes (Signed)
OT Cancellation Note  Patient Details Name: TACOMA DRUMM MRN: [id number] DOB: [birth year]   Cancelled Treatment:    Reason Eval/Treat Not Completed: OT screened, no needs identified, will sign off.  Pt for transfer to [facility] today.  OT needs can be addressed at SNF.  Jeani Hawking, OTR/L Acute Rehabilitation Services Pager [phone] Office [phone]   Jeani Hawking M 05/05/2019, 1:56 PM

## 2019-05-05 NOTE — ED Notes (Signed)
Joey, SW, in w/pt.  

## 2019-05-05 NOTE — Progress Notes (Addendum)
ED 050C [facility]) RN Note  Per LCSW notes and conversation with Joey, LCSW, pt seeking SNF placement. If patient and family wish for patient to receive hospice care, order will need to come from facility.  We will be happy to follow with patient in facility.  Addendum: Received phone call back from Joey, LCSW. Pt was not aware that a hospice referral was made from his PCP. He will follow up with his PCP once established in the facility. Joey states patient will be going to [facility].  Please call with any questions or concerns.  Thank you, Haynes Bast, RN, BSN [facility] Liaison [phone]  [facility] Liaisons are on AMION under [facility].

## 2019-05-05 NOTE — ED Notes (Signed)
Breakfast tray ordered 

## 2019-05-05 NOTE — Progress Notes (Addendum)
2:20PM: PTAR contacted. PTAR unsure of estimated time but added patient to their list for transfer.  Patient expressed a desire to speak with his son prior to going to [place]. At this time patient continued to agree to discharge plan to [facility].   Tenna Delaine, LCSW, LCAS-A Clinical Social Worker II [phone]

## 2019-05-05 NOTE — Progress Notes (Signed)
Patient has stated he preferred the bed at [facility] at this time. CSW notes they have a bed available Monday. CSW contacted other bed offers and they will have openings for beds on Monday. CSW will continue to follow and see if any beds will be available today for discharge.  Tenna Delaine, LCSW, LCAS-A Clinical Social Worker II [phone]

## 2019-05-05 NOTE — ED Notes (Signed)
Pt requested to see Chaplain. Advised pt will request while waiting for PTAR.

## 2019-05-05 NOTE — Progress Notes (Signed)
CSW spoke with patient regarding hospice referral from primary care doctor. CSW noted patient was unaware. CSW noted patient agreed to go to nursing facility and will follow up with his primary care doctor.  Tenna Delaine, LCSW, LCAS-A Clinical Social Worker II [phone]

## 2019-05-05 NOTE — ED Provider Notes (Addendum)
  Physical Exam  BP (!) 137/94 (BP Location: Right Arm)   Pulse 71   Temp 97.7 F (36.5 C) (Oral)   Resp 20   Ht 5\' 10"  (1.778 m)   Wt (!) 150.1 kg   SpO2 96%   BMI 47.49 kg/m   Physical Exam Constitutional:      Appearance: He is well-developed. He is not toxic-appearing.  HENT:     Head: Normocephalic.  Eyes:     General: Lids are normal.  Neck:     Musculoskeletal: Full passive range of motion without pain.  Cardiovascular:     Rate and Rhythm: Normal rate.  Pulmonary:     Effort: Pulmonary effort is normal. No tachypnea or respiratory distress.  Musculoskeletal: Normal range of motion.  Neurological:     Mental Status: He is alert and oriented to person, place, and time.  Psychiatric:        Behavior: Behavior normal.        Thought Content: Thought content normal.        Judgment: Judgment is not impulsive.     Comments: Sitting on chair watching TV, behavior is appropriate.     MDM   0830: Patient brought to ED on 5/29 for being unable to care for himself at home.  Medical evaluation revealed stable medical status.  CW and SW have been involved and consulted.  PT evaluated patient on 5/29, recommend SNF placement. Chart, labs, VS reviewed. Labs unremarkable. SW filed NCFL2 form on 5/29, unclear what placement status is.  Will call SW to f/u. Will renew VS.   [phone]: Patient evaluated, exam benign. Per CSW patient has a bed offers at a few places but no beds available until Monday. VSS. Pending placement by SW. COVID negative.      Liberty Handy, PA-C 05/05/19 7412    Derwood Kaplan, MD 05/05/19 [phone]

## 2019-05-05 NOTE — ED Notes (Signed)
Pt given portable phone so he could make phone calls as needed.

## 2019-05-05 NOTE — ED Notes (Signed)
Janene Madeira PA aware pt is asking for his Albuterol Inhaler. Order received. Readback for verification.

## 2019-05-05 NOTE — Progress Notes (Signed)
Patient will discharge too: [place] Discharge date: 05/05/2019 Family notified: Son, Ruel Tosi Transport by: Sharin Mons  Per MD patient is appropriate for discharge and will discharge too [facility] in room 119. RN, patient, patient's family, and facility have been notified of discharge. Assessment, FL-2, PASRR, and discharge summary sent to facillity. RN was provided with the following number for report: [phone].  CSW is signing off.   Tenna Delaine, LCSW, LCAS-A Clinical Social Worker II [phone]

## 2019-05-17 ENCOUNTER — Telehealth: Payer: Self-pay | Admitting: Cardiology

## 2019-05-17 NOTE — Telephone Encounter (Signed)
New Message     Patient c/o Palpitations:  High priority if patient c/o lightheadedness, shortness of breath, or chest pain  1) How long have you had palpitations/irregular HR/ Afib? Are you having the symptoms now? A few days   2) Are you currently experiencing lightheadedness, SOB or CP? Lightheadedness, SOB   3) Do you have a history of afib (atrial fibrillation) or irregular heart rhythm? Yes   4) Have you checked your BP or HR? (document readings if available): Hr in 40s and 70s           BP 90/85   5) Are you experiencing any other symptoms? No

## 2019-05-17 NOTE — Telephone Encounter (Signed)
Follow up   Per the previous message Charles Mckinney is calling from [place] in reference to patient being dizzy. She would like a call back at [phone].

## 2019-05-17 NOTE — Telephone Encounter (Signed)
Spoke with happy, the current heart rate is 82 and he has had his morning medications. She will check orthostatic bp's and call back.

## 2019-05-17 NOTE — Telephone Encounter (Signed)
Spoke with pt, he called to report dizziness. He is in [facility] and reports not feeling well and his heart rate fluctuating as well as his bp. He is not able to tell me his bp or pulse rates. Called and left message for nurse at [facility] to call me back.

## 2019-05-20 ENCOUNTER — Telehealth: Payer: Self-pay | Admitting: Cardiology

## 2019-05-20 NOTE — Telephone Encounter (Signed)
  Patient is in a nursing home but not sure which one and he states they won't give him his inhaler and he is having trouble breathing. He could not give me the name of the nursing home or a number for him to be called back on.

## 2019-05-20 NOTE — Telephone Encounter (Signed)
Called unable to leave message mailbox is full

## 2019-05-21 NOTE — Telephone Encounter (Signed)
CALLED NO ANSWER  - MAILBOX IS FULL PATIENT IS AT A NURSING HOME  AT PRESENT TIME -  DR HARDING IS UNABLE TO CHANGE ORDERS-  CONTACT THE PA OR DOCTOR ON CALL FOR THE NURSING HOME    CLOSING ENCOUNTER.

## 2019-07-15 ENCOUNTER — Emergency Department (HOSPITAL_COMMUNITY): Payer: Medicare Other

## 2019-07-15 ENCOUNTER — Other Ambulatory Visit: Payer: Self-pay

## 2019-07-15 ENCOUNTER — Inpatient Hospital Stay (HOSPITAL_COMMUNITY)
Admission: EM | Admit: 2019-07-15 | Discharge: 2019-07-25 | DRG: 291 | Disposition: A | Discharge status: Home / Self Care | Payer: Medicare Other | Attending: Family Medicine | Admitting: Family Medicine

## 2019-07-15 ENCOUNTER — Encounter (HOSPITAL_COMMUNITY): Payer: Self-pay | Admitting: Emergency Medicine

## 2019-07-15 DIAGNOSIS — I5082 Biventricular heart failure: Secondary | ICD-10-CM | POA: Diagnosis present

## 2019-07-15 DIAGNOSIS — Z8673 Personal history of transient ischemic attack (TIA), and cerebral infarction without residual deficits: Secondary | ICD-10-CM

## 2019-07-15 DIAGNOSIS — T462X5A Adverse effect of other antidysrhythmic drugs, initial encounter: Secondary | ICD-10-CM | POA: Diagnosis present

## 2019-07-15 DIAGNOSIS — I4821 Permanent atrial fibrillation: Secondary | ICD-10-CM | POA: Diagnosis present

## 2019-07-15 DIAGNOSIS — I130 Hypertensive heart and chronic kidney disease with heart failure and stage 1 through stage 4 chronic kidney disease, or unspecified chronic kidney disease: Secondary | ICD-10-CM | POA: Diagnosis present

## 2019-07-15 DIAGNOSIS — Z833 Family history of diabetes mellitus: Secondary | ICD-10-CM

## 2019-07-15 DIAGNOSIS — R059 Cough, unspecified: Secondary | ICD-10-CM | POA: Diagnosis not present

## 2019-07-15 DIAGNOSIS — I714 Abdominal aortic aneurysm, without rupture, unspecified: Secondary | ICD-10-CM | POA: Diagnosis present

## 2019-07-15 DIAGNOSIS — I5043 Acute on chronic combined systolic (congestive) and diastolic (congestive) heart failure: Secondary | ICD-10-CM | POA: Diagnosis present

## 2019-07-15 DIAGNOSIS — I10 Essential (primary) hypertension: Secondary | ICD-10-CM | POA: Diagnosis present

## 2019-07-15 DIAGNOSIS — I255 Ischemic cardiomyopathy: Secondary | ICD-10-CM | POA: Diagnosis present

## 2019-07-15 DIAGNOSIS — E032 Hypothyroidism due to medicaments and other exogenous substances: Secondary | ICD-10-CM

## 2019-07-15 DIAGNOSIS — K219 Gastro-esophageal reflux disease without esophagitis: Secondary | ICD-10-CM | POA: Diagnosis present

## 2019-07-15 DIAGNOSIS — Z888 Allergy status to other drugs, medicaments and biological substances status: Secondary | ICD-10-CM

## 2019-07-15 DIAGNOSIS — Z20828 Contact with and (suspected) exposure to other viral communicable diseases: Secondary | ICD-10-CM | POA: Diagnosis present

## 2019-07-15 DIAGNOSIS — G4733 Obstructive sleep apnea (adult) (pediatric): Secondary | ICD-10-CM | POA: Diagnosis present

## 2019-07-15 DIAGNOSIS — I454 Nonspecific intraventricular block: Secondary | ICD-10-CM | POA: Diagnosis present

## 2019-07-15 DIAGNOSIS — I509 Heart failure, unspecified: Secondary | ICD-10-CM

## 2019-07-15 DIAGNOSIS — R05 Cough: Secondary | ICD-10-CM | POA: Diagnosis not present

## 2019-07-15 DIAGNOSIS — I878 Other specified disorders of veins: Secondary | ICD-10-CM | POA: Diagnosis present

## 2019-07-15 DIAGNOSIS — Z880 Allergy status to penicillin: Secondary | ICD-10-CM

## 2019-07-15 DIAGNOSIS — E785 Hyperlipidemia, unspecified: Secondary | ICD-10-CM | POA: Diagnosis present

## 2019-07-15 DIAGNOSIS — M109 Gout, unspecified: Secondary | ICD-10-CM | POA: Diagnosis present

## 2019-07-15 DIAGNOSIS — E876 Hypokalemia: Secondary | ICD-10-CM | POA: Diagnosis not present

## 2019-07-15 DIAGNOSIS — R001 Bradycardia, unspecified: Secondary | ICD-10-CM | POA: Diagnosis not present

## 2019-07-15 DIAGNOSIS — I25119 Atherosclerotic heart disease of native coronary artery with unspecified angina pectoris: Secondary | ICD-10-CM | POA: Diagnosis present

## 2019-07-15 DIAGNOSIS — E039 Hypothyroidism, unspecified: Secondary | ICD-10-CM | POA: Diagnosis present

## 2019-07-15 DIAGNOSIS — I4819 Other persistent atrial fibrillation: Secondary | ICD-10-CM | POA: Diagnosis present

## 2019-07-15 DIAGNOSIS — I959 Hypotension, unspecified: Secondary | ICD-10-CM | POA: Diagnosis not present

## 2019-07-15 DIAGNOSIS — Z87442 Personal history of urinary calculi: Secondary | ICD-10-CM

## 2019-07-15 DIAGNOSIS — N183 Chronic kidney disease, stage 3 unspecified: Secondary | ICD-10-CM | POA: Diagnosis present

## 2019-07-15 DIAGNOSIS — K429 Umbilical hernia without obstruction or gangrene: Secondary | ICD-10-CM | POA: Diagnosis present

## 2019-07-15 DIAGNOSIS — Z6841 Body Mass Index (BMI) 40.0 and over, adult: Secondary | ICD-10-CM

## 2019-07-15 DIAGNOSIS — Z823 Family history of stroke: Secondary | ICD-10-CM

## 2019-07-15 DIAGNOSIS — Z951 Presence of aortocoronary bypass graft: Secondary | ICD-10-CM

## 2019-07-15 DIAGNOSIS — Z8249 Family history of ischemic heart disease and other diseases of the circulatory system: Secondary | ICD-10-CM

## 2019-07-15 DIAGNOSIS — Z7901 Long term (current) use of anticoagulants: Secondary | ICD-10-CM

## 2019-07-15 DIAGNOSIS — R531 Weakness: Secondary | ICD-10-CM | POA: Diagnosis present

## 2019-07-15 DIAGNOSIS — M1990 Unspecified osteoarthritis, unspecified site: Secondary | ICD-10-CM | POA: Diagnosis present

## 2019-07-15 DIAGNOSIS — I4811 Longstanding persistent atrial fibrillation: Secondary | ICD-10-CM | POA: Diagnosis not present

## 2019-07-15 DIAGNOSIS — Z79899 Other long term (current) drug therapy: Secondary | ICD-10-CM

## 2019-07-15 DIAGNOSIS — Z87891 Personal history of nicotine dependence: Secondary | ICD-10-CM

## 2019-07-15 DIAGNOSIS — Z9119 Patient's noncompliance with other medical treatment and regimen: Secondary | ICD-10-CM

## 2019-07-15 HISTORY — DX: Other persistent atrial fibrillation: I48.19

## 2019-07-15 HISTORY — DX: Cough, unspecified: R05.9

## 2019-07-15 HISTORY — DX: Hypothyroidism due to medicaments and other exogenous substances: E03.2

## 2019-07-15 HISTORY — DX: Morbid (severe) obesity due to excess calories: E66.01

## 2019-07-15 HISTORY — DX: Hyperlipidemia, unspecified: E78.5

## 2019-07-15 LAB — CBC WITH DIFFERENTIAL/PLATELET
Abs Immature Granulocytes: 0.06 10*3/uL (ref 0.00–0.07)
Basophils Absolute: 0.1 10*3/uL (ref 0.0–0.1)
Basophils Relative: 1 %
Eosinophils Absolute: 0.3 10*3/uL (ref 0.0–0.5)
Eosinophils Relative: 4 %
HCT: 39.9 % (ref 39.0–52.0)
Hemoglobin: 12.5 g/dL — ABNORMAL LOW (ref 13.0–17.0)
Immature Granulocytes: 1 %
Lymphocytes Relative: 16 %
Lymphs Abs: 1.2 10*3/uL (ref 0.7–4.0)
MCH: 32.7 pg (ref 26.0–34.0)
MCHC: 31.3 g/dL (ref 30.0–36.0)
MCV: 104.5 fL — ABNORMAL HIGH (ref 80.0–100.0)
Monocytes Absolute: 0.6 10*3/uL (ref 0.1–1.0)
Monocytes Relative: 8 %
Neutro Abs: 5.3 10*3/uL (ref 1.7–7.7)
Neutrophils Relative %: 70 %
Platelets: 185 10*3/uL (ref 150–400)
RBC: 3.82 MIL/uL — ABNORMAL LOW (ref 4.22–5.81)
RDW: 14.3 % (ref 11.5–15.5)
WBC: 7.5 10*3/uL (ref 4.0–10.5)
nRBC: 0 % (ref 0.0–0.2)

## 2019-07-15 LAB — COMPREHENSIVE METABOLIC PANEL
ALT: 26 U/L (ref 0–44)
AST: 26 U/L (ref 15–41)
Albumin: 3.4 g/dL — ABNORMAL LOW (ref 3.5–5.0)
Alkaline Phosphatase: 39 U/L (ref 38–126)
Anion gap: 10 (ref 5–15)
BUN: 20 mg/dL (ref 8–23)
CO2: 26 mmol/L (ref 22–32)
Calcium: 8.7 mg/dL — ABNORMAL LOW (ref 8.9–10.3)
Chloride: 106 mmol/L (ref 98–111)
Creatinine, Ser: 1.49 mg/dL — ABNORMAL HIGH (ref 0.61–1.24)
GFR calc Af Amer: 55 mL/min — ABNORMAL LOW (ref >60)
GFR calc non Af Amer: 48 mL/min — ABNORMAL LOW (ref >60)
Glucose, Bld: 98 mg/dL (ref 70–99)
Potassium: 3.9 mmol/L (ref 3.5–5.1)
Sodium: 142 mmol/L (ref 135–145)
Total Bilirubin: 0.8 mg/dL (ref 0.3–1.2)
Total Protein: 6 g/dL — ABNORMAL LOW (ref 6.5–8.1)

## 2019-07-15 LAB — URINALYSIS, ROUTINE W REFLEX MICROSCOPIC
Bilirubin Urine: NEGATIVE
Glucose, UA: NEGATIVE mg/dL
Ketones, ur: NEGATIVE mg/dL
Leukocytes,Ua: NEGATIVE
Nitrite: NEGATIVE
Protein, ur: NEGATIVE mg/dL
Specific Gravity, Urine: 1.005 (ref 1.005–1.030)
pH: 6 (ref 5.0–8.0)

## 2019-07-15 LAB — SARS CORONAVIRUS 2 BY RT PCR (HOSPITAL ORDER, PERFORMED IN ~~LOC~~ HOSPITAL LAB): SARS Coronavirus 2: NEGATIVE

## 2019-07-15 LAB — DIGOXIN LEVEL: Digoxin Level: <0.2 ng/mL — ABNORMAL LOW (ref 0.8–2.0)

## 2019-07-15 LAB — BRAIN NATRIURETIC PEPTIDE: B Natriuretic Peptide: 298 pg/mL — ABNORMAL HIGH (ref 0.0–100.0)

## 2019-07-15 MED ORDER — MOMETASONE FURO-FORMOTEROL FUM 200-5 MCG/ACT IN AERO
2.0000 | INHALATION_SPRAY | Freq: Two times a day (BID) | RESPIRATORY_TRACT | Status: DC
  Administered 2019-07-16 – 2019-07-24 (×17): 2 via RESPIRATORY_TRACT
  Filled 2019-07-15: qty 8.8

## 2019-07-15 MED ORDER — FUROSEMIDE 10 MG/ML IJ SOLN: 60.0000 mg | Freq: Every day | INTRAMUSCULAR | Status: DC

## 2019-07-15 MED ORDER — ACETAMINOPHEN 325 MG PO TABS
650.0000 mg | ORAL_TABLET | ORAL | Status: DC | PRN
  Administered 2019-07-18 – 2019-07-24 (×5): 650 mg via ORAL
  Filled 2019-07-15 (×5): qty 2

## 2019-07-15 MED ORDER — CARVEDILOL 12.5 MG PO TABS
12.5000 mg | ORAL_TABLET | Freq: Two times a day (BID) | ORAL | Status: DC
  Administered 2019-07-16: 12.5 mg via ORAL
  Filled 2019-07-15: qty 1

## 2019-07-15 MED ORDER — POTASSIUM CHLORIDE CRYS ER 20 MEQ PO TBCR
20.0000 meq | EXTENDED_RELEASE_TABLET | Freq: Every day | ORAL | Status: DC
  Administered 2019-07-16 – 2019-07-24 (×9): 20 meq via ORAL
  Filled 2019-07-15 (×9): qty 1

## 2019-07-15 MED ORDER — AMIODARONE HCL 200 MG PO TABS
200.0000 mg | ORAL_TABLET | Freq: Every day | ORAL | Status: DC
  Filled 2019-07-15: qty 1

## 2019-07-15 MED ORDER — SODIUM CHLORIDE 0.9% FLUSH: 3.0000 mL | INTRAVENOUS | Status: DC | PRN

## 2019-07-15 MED ORDER — APIXABAN 2.5 MG PO TABS
2.5000 mg | ORAL_TABLET | Freq: Two times a day (BID) | ORAL | Status: DC
  Administered 2019-07-15 – 2019-07-17 (×4): 2.5 mg via ORAL
  Filled 2019-07-15 (×4): qty 1

## 2019-07-15 MED ORDER — DIGOXIN 125 MCG PO TABS
0.1250 mg | ORAL_TABLET | Freq: Every day | ORAL | Status: DC
  Administered 2019-07-16: 0.125 mg via ORAL
  Filled 2019-07-15: qty 1

## 2019-07-15 MED ORDER — SODIUM CHLORIDE 0.9 % IV SOLN: 250.0000 mL | INTRAVENOUS | Status: DC | PRN

## 2019-07-15 MED ORDER — SACUBITRIL-VALSARTAN 97-103 MG PO TABS
1.0000 | ORAL_TABLET | Freq: Two times a day (BID) | ORAL | Status: DC
  Administered 2019-07-15 – 2019-07-18 (×4): 1 via ORAL
  Filled 2019-07-15 (×7): qty 1

## 2019-07-15 MED ORDER — SODIUM CHLORIDE 0.9% FLUSH
3.0000 mL | Freq: Two times a day (BID) | INTRAVENOUS | Status: DC
  Administered 2019-07-15 – 2019-07-25 (×20): 3 mL via INTRAVENOUS

## 2019-07-15 MED ORDER — PRAVASTATIN SODIUM 40 MG PO TABS
40.0000 mg | ORAL_TABLET | Freq: Every day | ORAL | Status: DC
  Administered 2019-07-16 – 2019-07-25 (×10): 40 mg via ORAL
  Filled 2019-07-15 (×4): qty 1
  Filled 2019-07-15: qty 4
  Filled 2019-07-15 (×5): qty 1

## 2019-07-15 MED ORDER — FUROSEMIDE 10 MG/ML IJ SOLN
40.0000 mg | Freq: Once | INTRAMUSCULAR | Status: AC
  Administered 2019-07-15: 40 mg via INTRAVENOUS
  Filled 2019-07-15: qty 4

## 2019-07-15 NOTE — Progress Notes (Signed)
Patient with symptoms of shortness of breath that is likely due to heart failure however patient also with new oxygen requirement.  Discussed with ED attending who states the patient is stable enough for [place] to wait for COVID results before admitting patient in case he may need to be transferred to [facility] [name] vital signs are stable and we will continue to monitor along in chart as we await COVID results.   Did call and talk with patient over the phone.  He is is able to talk in complete sentences without issue on 2 L.  He states that he has been having increased shortness of breath for the past few days.  He discussed with his doctor who told him to come into the emergency room so that he could be evaluated for admission for diuresis.  Patient reports that he has been urinating well in the ED since receiving the IV Lasix.  He reports that he has had increased shortness of breath, no chest pain.  He endorses chronic lower extremity edema.   [place] Jailen Lung, DO PGY-3, [facility]

## 2019-07-15 NOTE — Plan of Care (Signed)
?  Problem: Coping: ?Goal: Level of anxiety will decrease ?Outcome: Progressing ?  ?Problem: Safety: ?Goal: Ability to remain free from injury will improve ?Outcome: Progressing ?  ?

## 2019-07-15 NOTE — ED Triage Notes (Signed)
[facility]- pt sent from PCP. Pt has had an increase in fluid retention and SOB. HX of CHF. Pt is non-compliant with meds. High sodium diet. Pt was 90% on room air and lethargic. Pt placed on [facility] 2L and now fine. Crackles in lungs.   20 LH

## 2019-07-15 NOTE — H&P (Addendum)
[facility] Admission History and Physical Service Pager: [phone]  Patient name: Charles Mckinney Medical record number: [id number] Date of birth: 17-Apr-1951 Age: 68 y.o. Gender: male  Primary Care Provider: Tsosie Billing, MD (Inactive) Consultants: none Code Status: Full Emergency Contact: Lawrence Marseilles: [phone]  Chief Complaint: SOB  Assessment and Plan: Charles Mckinney is a 68 y.o. male presenting with worsening shortness of breath . PMH is significant for CAD s/p CABG x4, HTN, HLD, GERD, A fib on eliquis, HFrEF (EF 30-35%), gout, OSA,   Acute on Chronic HFrEF Patient reporting increased shortness of breath with minimal exertion.  COVID negative in ED.  He has been compliant with his 60 mg of Lasix daily and has been taking an additional 20 mg per his PCP.  Patient was instructed to come into the hospital by his PCP in order to have IV diuresis.  Patient reports that he was recently at a rehab facility but was discharged home where he has been living alone.  States that since [place] everything has started to go downhill.  Patient states that he has issues performing his ADLs.  Echo 10/2018 showed EF of 30 to 31% with diastolic dysfunction (unable to quantitate secondary to A. Fib).  Patient is followed closely with cardiology and has Entresto at max dose.  Has not tolerated spironolactone in the past.  Patient on digoxin.  Has had discussions regarding ICD in the past but has refused previously.  Vital signs stable in the ED.  Patient is requiring 2 L nasal cannula for increased work of breathing and desaturation with exertion.  Patient received 40 mg Lasix IV in the ED and has had good urine output. -Admit to tele for IV diuresis, attending Dr. Nori Riis -Consult cardiology in the a.m. -Continue IV diuresis with Lasix 60 mg in a.m. -Strict I's and O's, daily weights -Vitals per routine -Obtain digoxin level -PRN Wheeler for O2 saturation >88% -Continue  home Coreg, Entresto -PT/OT to eval and treat given that patient is living alone -Monitor electrolytes and replete as needed  CAD s/p CABG x4 A fib on eliquis Prolonged QT Patient rate controlled during admission.  Denies any pain -Continue cardiac monitoring -Consult cardiology as above -Continue digoxin, amiodarone and Coreg   HTN Patient reports compliance with his medications at home.  BP in the ED 128/90 -Monitor  HLD Continue home statin  GERD Monitor  Gout No signs of acute flare at this time and patient does not appear to be on allopurinol  OSA Patient reports noncompliance with his CPAP at home given that this causes him to not be able to sleep as well.  Patient counseled on the importance of wearing his CPAP while admitted. -CPAP nightly  FEN/GI: heart healthy diet Prophylaxis: continue home eliquis  Disposition: admit to inpatient   History of Present Illness:  Charles Mckinney is a 68 y.o. male presenting with worsening shortness of breath.  Patient reports that he has previously been discharged from a nursing home facility.  States that while he was there he had experienced a few episodes of shortness of breath.  He has been following up with his primary care physician who increased his home Lasix from 60 mg and added an additional 20 mg.  However in the past 2 to 3 days he has had even more worsening shortness of breath.  His PCP started him to come into the emergency room today given his increased shortness of breath.  Patient reports  that he lives alone.  He states that he was doing pretty well prior to COVID.  Patient reports that he has not been able to lay flat for 2 to 3 years and usually sleeps in his recliner.  He states that when he is previously tried to use a CPAP it has caused him to not be able to sleep and feel like he is choking so he does not wear this at night.  Patient states that he is not on any oxygen at home and that this oxygen requirement in  the emergency room is new for him.  He states that he feels like he has begun swelling more and more.  He reports compliance with all of his medications at home.  Review Of Systems: Per HPI with the following additions:   Review of Systems  Constitutional: Positive for malaise/fatigue. Negative for chills and fever.  Eyes: Negative for blurred vision and double vision.  Respiratory: Positive for shortness of breath. Negative for cough.   Cardiovascular: Positive for orthopnea and leg swelling. Negative for chest pain and palpitations.  Gastrointestinal: Positive for abdominal pain.       Difficult to reduce umbilical hernia present  Genitourinary: Positive for frequency.  Musculoskeletal: Positive for back pain.    Patient Active Problem List   Diagnosis Date Noted  . CHF exacerbation (HCC) 07/15/2019  . AAA (abdominal aortic aneurysm) without rupture (HCC) 03/14/2019  . Ischemic cardiomyopathy 02/01/2018  . OSA (obstructive sleep apnea) 10/04/2017  . Essential hypertension 05/30/2017  . Gout 05/30/2017  . Chest pain 05/29/2017  . CKD (chronic kidney disease), stage III (HCC) 05/29/2017  . TIA (transient ischemic attack) 04/26/2017  . Hypokalemia 04/26/2017  . Prolonged QT interval 04/26/2017  . Coronary artery disease involving native coronary artery of native heart with angina pectoris (HCC) 02/02/2016  . 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 Chronic combined systolic and diastolic CHF, NYHA class 3 (HCC) 01/19/2015  . Long term current use of anticoagulant therapy 01/19/2015  . Persistent atrial fibrillation (HCC): CHA2DS2Vasc 6   . GERD 11/22/2007  . Hyperlipidemia with target LDL less than 70   . Morbid obesity (HCC)     Past Medical History: Past Medical History:  Diagnosis Date  . Anxiety   . 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 -->  CABG x 4: LIMA-LAD, SVG-Diag, SeqSVG-rPL-rPDA --> Intra-Op TEE showed EF of 30% with only mild MR.   . 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  Suggestion of anterior infarct on Myoview/echo.  . Congestive heart failure (CHF) (HCC)   . GERD (gastroesophageal reflux disease)   . Hyperlipidemia   . Hypertension   . Hypertensive heart disease    Mild to moderately dilated LV with mild LVH.  . Ischemic cardiomyopathy 01/2014   EF 30-35%.  Severe multivessel CAD noted on cath--CABG x4 -> Myoview (June 2018) shows large anterior-septal-apical infarct with no ischemia.  . Kidney stones   . Obesity (BMI 30-39.9)   . Osteoarthritis   . Permanent atrial fibrillation    On Amiodarone & Beta Blocker (on Xarelto)  . PONV (postoperative nausea and vomiting)     Past Surgical History: Past Surgical History:  Procedure Laterality Date  . CARDIAC  CATHETERIZATION N/A 04/09/2015   Procedure: Right/Left Heart Cath and Coronary Angiography;  Surgeon: Orpah CobbAjay Kadakia, MD;  Location: [facility] INVASIVE CV LAB CUPID::  LM 95%; ostLAD 99% - p-dLAD 80%, D1 99% - D2 80%; OCx 90%,pCx 50%; pRCA 80%, mRCA 50%, rPDA 80% & 90%.  . CARDIOVERSION N/A 01/19/2015   Procedure: CARDIOVERSION;  Surgeon: Othella BoyerWilliam S Tilley, MD;  [facility];  Service: Cardiovascular;  Laterality: N/A;  pt in Afib, 12:22 synched cardioversion @  120 joules using Propofol 100 mg,IV....unsuccessful, repeated at 200 joules  . CARDIOVERSION N/A 04/02/2015   Procedure: CARDIOVERSION;  Surgeon: Orpah CobbAjay Kadakia, MD;  [facility];  Service: Cardiovascular;  Laterality: N/A;  . CATARACT EXTRACTION    . CLIPPING OF ATRIAL APPENDAGE  04/13/2015   Procedure: CLIPPING OF ATRIAL APPENDAGE;  Surgeon: Alleen BorneBryan K Bartle, MD;  Location: [facility] OR;  Service: Open Heart Surgery;;  . CORONARY ARTERY BYPASS GRAFT N/A 04/13/2015   Procedure: CORONARY ARTERY BYPASS GRAFTING  (CABG);  Surgeon: Alleen BorneBryan K Bartle, MD;  [facility];  Service: Open Heart Surgery;; LIMA-LAD, SVG-Diag, SeqSVG-rPL-rPDA   . NM MYOVIEW LTD  05/2017   EF 27%.  No reversible ischemia.  Large anterior-apical and septal infarct.  Diffuse LV HK and moderate LV dilation.  . TEE WITHOUT CARDIOVERSION N/A 04/13/2015   Procedure: TRANSESOPHAGEAL ECHOCARDIOGRAM (TEE);  Surgeon: Alleen BorneBryan K Bartle, MD;  [facility];  Service: Open Heart Surgery;  Laterality: N/A;  . TRANSTHORACIC ECHOCARDIOGRAM  2/'15, 2/'16, 4/'16   a) EF 30-35%.  Mild LVH.  GRII DD.  Mild LA dilation.  Moderate diastolic dysfunction. b) Technically difficult.  Mild LVH.  EF 35%.  Diffuse HK.  Moderate LA dilation.  Mildly dilated RV with mildly reduced function.  c)   Mild concentric LVH.  EF 30-35%, diffuse HK.  Severe HK of mid apical inferior wall.  Moderate LA dilation.  . TRANSTHORACIC ECHOCARDIOGRAM N/A 1/18, 5/18, 7/18   a) Moderate severely reduced LV.  EF 30 of 35% with diffuse HK.  Mild MR.  Mildly dilated RA.;; b) (In setting of TIA).  Technically difficult mild to moderate globally reduced LV function.  Mild LVH.  Mild MR.  EF estimated 40-45%.  Moderate LA dilation.;; c)  Afib. Mild to mod dilated LV with mild LVH. EF 30-35% with diffuse HK. Mildly dilated RV with mildly decrease Systolic Fxn. Mild MR    Social History: Social History   Tobacco Use  . Smoking status: Former Smoker    Quit date: 2000    Years since quitting: 20.6  . Smokeless tobacco: Never Used  Substance Use Topics  . Alcohol use: No  . Drug use: No   Additional social history: Former smoker, denies any alcohol use or illicit drug use.  Lives alone. Please also refer to relevant sections of EMR.  Family History: Family History  Problem Relation Age of Onset  . Diabetes Mother   . Heart disease Father   . Congestive Heart Failure Sister   . Stroke Brother   . Heart disease Brother   . Diabetes Brother     Allergies and  Medications: Allergies  Allergen Reactions  . Niacin And Related Other (See Comments)    FLUSHING  . Xarelto [Rivaroxaban] Other (See Comments)    Cause bloody stools  . Penicillins Itching, Rash and Other (See Comments)    Has patient had a PCN reaction causing immediate rash, facial/tongue/throat swelling, SOB or lightheadedness with hypotension: Yes Has patient had a PCN reaction  causing severe rash involving mucus membranes or skin necrosis: No Has patient had a PCN reaction that required hospitalization: No Has patient had a PCN reaction occurring within the last 10 years: No If all of the above answers are "NO", then may proceed with Cephalosporin use.    No current facility-administered medications on file prior to encounter.    Current Outpatient Medications on File Prior to Encounter  Medication Sig Dispense Refill  . albuterol (PROAIR HFA) 108 (90 Base) MCG/ACT inhaler INHALE 1 PUFF INTO THE LUNGS EVERY 6 HOURS AS NEEDED FOR WHEEZING OR SHORTNESS OF BREATH 8.5 g 3  . albuterol (PROVENTIL) (2.5 MG/3ML) 0.083% nebulizer solution USE ONE VIAL USING NEBULIZER EVERY 6 (SIX) HOURS AS NEEDED FOR WHEEZING OR SHORTNESS OF BREATH. (Patient taking differently: Take 2.5 mg by nebulization every 6 (six) hours as needed for wheezing or shortness of breath. ) 150 mL 1  . allopurinol (ZYLOPRIM) 100 MG tablet TAKE 2 TABLETS(200 MG) BY MOUTH DAILY (Patient taking differently: Take 200 mg by mouth daily. ) 180 tablet 0  . amiodarone (PACERONE) 200 MG tablet TAKE 1 TABLET(200 MG) BY MOUTH DAILY (Patient taking differently: Take 200 mg by mouth daily. ) 90 tablet 0  . apixaban (ELIQUIS) 2.5 MG TABS tablet Take 1 tablet (2.5 mg total) by mouth 2 (two) times daily. 60 tablet 3  . carvedilol (COREG) 12.5 MG tablet Take 1 tablet (12.5 mg total) by mouth 2 (two) times daily with a meal. 60 tablet 2  . carvedilol (COREG) 6.25 MG tablet     . Cholecalciferol (VITAMIN D PO) Take 1 tablet by mouth daily.    .  colchicine 0.6 MG tablet Take 0.6 mg by mouth 2 (two) times daily as needed (gout flare up).     . DIGOX 125 MCG tablet Take 1 tablet (0.125 mg total) by mouth daily. 30 tablet 6  . fluticasone (FLONASE) 50 MCG/ACT nasal spray Place 1 spray into both nostrils daily.    . Fluticasone-Salmeterol (ADVAIR DISKUS) 250-50 MCG/DOSE AEPB Inhale 1 puff into the lungs 2 (two) times daily. 1 each 3  . furosemide (LASIX) 20 MG tablet Take 3 tablets (60 mg total) by mouth daily. May take an additional 20 mg tablet as needed daily for increase swelling (Patient taking differently: Take 20-40 mg by mouth See admin instructions. 40mg  in the am and 20mg  in the pm) 360 tablet 3  . nitroGLYCERIN (NITROSTAT) 0.4 MG SL tablet Place 0.4 mg under the tongue every 5 (five) minutes as needed for chest pain.    . potassium chloride SA (K-DUR,KLOR-CON) 20 MEQ tablet Take 1 tablet (20 mEq total) by mouth daily. 90 tablet 1  . pravastatin (PRAVACHOL) 40 MG tablet TAKE ONE TABLET BY MOUTH ONCE DAILY 90 tablet 1  . sacubitril-valsartan (ENTRESTO) 97-103 MG Take 1 tablet by mouth 2 (two) times daily. 60 tablet 7  . spironolactone (ALDACTONE) 25 MG tablet Take 25 mg by mouth See admin instructions. Take 25 mg by mouth once a day for 14 days      Objective: BP (!) 145/103   Pulse 69   Temp 98.9 F (37.2 C) (Oral)   Resp 20   Ht 5\' 10"  (1.778 m)   Wt (!) 160.6 kg   SpO2 96%   BMI 50.79 kg/m  Exam: General: NAD, pleasant, obese male sitting up in bed Eyes: PERRL, EOMI, no conjunctival pallor or injection Neck: Supple, no LAD Cardiovascular: RRR, no m/r/g, 3+ pitting BL LE edema Respiratory:  Difficult exam due to body habitus, decreased breath sounds throughout, normal work of breathing on 2L [place] Gastrointestinal: soft, nontender, distended, normoactive BS, difficult to reduce umbilical hernia present Derm: Multiple chronic wounds on BL LE Neuro: CN II-XII grossly intact Psych: AOx3, appropriate affect   Labs and  Imaging: CBC BMET  Recent Labs  Lab 07/15/19 1512  WBC 7.5  HGB 12.5*  HCT 39.9  PLT 185   Recent Labs  Lab 07/15/19 1512  NA 142  K 3.9  CL 106  CO2 26  BUN 20  CREATININE 1.49*  GLUCOSE 98  CALCIUM 8.7*     Dg Chest Port 1 View  Result Date: 07/15/2019 CLINICAL DATA:  Shortness of breath. EXAM: PORTABLE CHEST 1 VIEW COMPARISON:  Radiographs of May 03, 2019. FINDINGS: Stable cardiomegaly. Sternotomy wires are noted. No pneumothorax is noted. Stable bibasilar atelectasis or scarring is noted with probable small pleural effusions. Bony thorax is unremarkable. IMPRESSION: Stable bibasilar subsegmental atelectasis or scarring is noted with probable small pleural effusions. Electronically Signed   By: Lupita RaiderJames  Green Jr M.D.   On: 07/15/2019 15:20    Corean Yoshimura, [place], DO 07/15/2019, 6:32 PM PGY-3, [facility] FPTS Intern pager: [phone], text pages welcome

## 2019-07-15 NOTE — ED Notes (Signed)
ED TO INPATIENT HANDOFF REPORT  ED Nurse Name and Phone #: Leatha Gilding [phone]  S Name/Age/Gender Charles Mckinney 68 y.o. male Room/Bed: 041C/041C  Code Status   Code Status: Prior  Home/SNF/Other Home Patient oriented to: self, place, time and situation Is this baseline? Yes   Triage Complete: Triage complete  Chief Complaint sob  Triage Note [facility]- pt sent from PCP. Pt has had an increase in fluid retention and SOB. HX of CHF. Pt is non-compliant with meds. High sodium diet. Pt was 90% on room air and lethargic. Pt placed on [facility] 2L and now fine. Crackles in lungs.   20 LH    Allergies Allergies  Allergen Reactions  . Niacin And Related Other (See Comments)    FLUSHING  . Xarelto [Rivaroxaban] Other (See Comments)    Cause bloody stools  . Penicillins Itching, Rash and Other (See Comments)    Has patient had a PCN reaction causing immediate rash, facial/tongue/throat swelling, SOB or lightheadedness with hypotension: Yes Has patient had a PCN reaction causing severe rash involving mucus membranes or skin necrosis: No Has patient had a PCN reaction that required hospitalization: No Has patient had a PCN reaction occurring within the last 10 years: No If all of the above answers are "NO", then may proceed with Cephalosporin use.     Level of Care/Admitting Diagnosis ED Disposition    ED Disposition Condition Comment   Admit  Hospital Area: [facility] [[id number]]  Level of Care: Med-Surg [16]  Covid Evaluation: Confirmed COVID Negative  Diagnosis: CHF exacerbation [facility]) [[id number]]  Admitting Physician: SHIRLEY, [place] [[id number]]  Attending Physician: Nestor Ramp [[id number]]  Estimated length of stay: past midnight tomorrow  Certification:: I certify this patient will need inpatient services for at least 2 midnights  PT Class (Do Not Modify): Inpatient [101]  PT Acc Code (Do Not Modify): Private [1]       B Medical/Surgery History Past Medical  History:  Diagnosis Date  . Anxiety   . 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 --> CABG x 4: LIMA-LAD, SVG-Diag, SeqSVG-rPL-rPDA --> Intra-Op TEE showed EF of 30% with only mild MR.   . 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  Suggestion of anterior infarct on Myoview/echo.  . Congestive heart failure (CHF) (HCC)   . GERD (gastroesophageal reflux disease)   . Hyperlipidemia   . Hypertension   . Hypertensive heart disease    Mild to moderately dilated LV with mild LVH.  . Ischemic cardiomyopathy 01/2014   EF 30-35%.  Severe multivessel CAD noted on cath--CABG x4 -> Myoview (June 2018) shows large anterior-septal-apical infarct with no ischemia.  . Kidney stones   . Obesity (BMI 30-39.9)   . Osteoarthritis   . Permanent atrial fibrillation    On Amiodarone & Beta Blocker (on Xarelto)  . PONV (postoperative nausea and vomiting)    Past Surgical History:  Procedure Laterality Date  . CARDIAC CATHETERIZATION N/A 04/09/2015   Procedure: Right/Left Heart Cath and Coronary Angiography;  Surgeon: Orpah Cobb, MD;  [facility] INVASIVE CV LAB CUPID::  LM 95%; ostLAD 99% - p-dLAD 80%, D1 99% - D2 80%; OCx 90%,pCx 50%; pRCA 80%, mRCA 50%, rPDA 80% & 90%.  Marland Kitchen  CARDIOVERSION N/A 01/19/2015   Procedure: CARDIOVERSION;  Surgeon: Othella BoyerWilliam S Tilley, MD;  [facility];  Service: Cardiovascular;  Laterality: N/A;  pt in Afib, 12:22 synched cardioversion @  120 joules using Propofol 100 mg,IV....unsuccessful, repeated at 200 joules  . CARDIOVERSION N/A 04/02/2015   Procedure: CARDIOVERSION;  Surgeon: Orpah CobbAjay Kadakia, MD;  [facility];  Service: Cardiovascular;  Laterality: N/A;  . CATARACT EXTRACTION    . CLIPPING OF ATRIAL APPENDAGE  04/13/2015   Procedure:  CLIPPING OF ATRIAL APPENDAGE;  Surgeon: Alleen BorneBryan K Bartle, MD;  Location: [facility] OR;  Service: Open Heart Surgery;;  . CORONARY ARTERY BYPASS GRAFT N/A 04/13/2015   Procedure: CORONARY ARTERY BYPASS GRAFTING (CABG);  Surgeon: Alleen BorneBryan K Bartle, MD;  [facility];  Service: Open Heart Surgery;; LIMA-LAD, SVG-Diag, SeqSVG-rPL-rPDA   . NM MYOVIEW LTD  05/2017   EF 27%.  No reversible ischemia.  Large anterior-apical and septal infarct.  Diffuse LV HK and moderate LV dilation.  . TEE WITHOUT CARDIOVERSION N/A 04/13/2015   Procedure: TRANSESOPHAGEAL ECHOCARDIOGRAM (TEE);  Surgeon: Alleen BorneBryan K Bartle, MD;  [facility];  Service: Open Heart Surgery;  Laterality: N/A;  . TRANSTHORACIC ECHOCARDIOGRAM  2/'15, 2/'16, 4/'16   a) EF 30-35%.  Mild LVH.  GRII DD.  Mild LA dilation.  Moderate diastolic dysfunction. b) Technically difficult.  Mild LVH.  EF 35%.  Diffuse HK.  Moderate LA dilation.  Mildly dilated RV with mildly reduced function.  c)   Mild concentric LVH.  EF 30-35%, diffuse HK.  Severe HK of mid apical inferior wall.  Moderate LA dilation.  . TRANSTHORACIC ECHOCARDIOGRAM N/A 1/18, 5/18, 7/18   a) Moderate severely reduced LV.  EF 30 of 35% with diffuse HK.  Mild MR.  Mildly dilated RA.;; b) (In setting of TIA).  Technically difficult mild to moderate globally reduced LV function.  Mild LVH.  Mild MR.  EF estimated 40-45%.  Moderate LA dilation.;; c)  Afib. Mild to mod dilated LV with mild LVH. EF 30-35% with diffuse HK. Mildly dilated RV with mildly decrease Systolic Fxn. Mild MR     A IV Location/Drains/Wounds Patient Lines/Drains/Airways Status   Active Line/Drains/Airways    Name:   Placement date:   Placement time:   Site:   Days:   Peripheral IV 07/15/19 Left Hand   07/15/19    1428    Hand   less than 1          Intake/Output Last 24 hours No intake or output data in the 24 hours ending 07/15/19 1852  Labs/Imaging Results for orders placed or performed during the hospital encounter of 07/15/19  (from the past 48 hour(s))  Comprehensive metabolic panel     Status: Abnormal   Collection Time: 07/15/19  3:12 PM  Result Value Ref Range   Sodium 142 135 - 145 mmol/L   Potassium 3.9 3.5 - 5.1 mmol/L   Chloride 106 98 - 111 mmol/L   CO2 26 22 - 32 mmol/L   Glucose, Bld 98 70 - 99 mg/dL   BUN 20 8 - 23 mg/dL   Creatinine, Ser 1.611.49 (H) 0.61 - 1.24 mg/dL   Calcium 8.7 (L) 8.9 - 10.3 mg/dL   Total Protein 6.0 (L) 6.5 - 8.1 g/dL   Albumin 3.4 (L) 3.5 - 5.0 g/dL   AST 26 15 - 41 U/L   ALT 26 0 - 44 U/L   Alkaline Phosphatase 39 38 - 126 U/L   Total Bilirubin 0.8 0.3 -  1.2 mg/dL   GFR calc non Af Amer 48 (L) >60 mL/min   GFR calc Af Amer 55 (L) >60 mL/min   Anion gap 10 5 - 15    Comment: Performed at [facility] [address] [place], [place] [address]  CBC WITH DIFFERENTIAL     Status: Abnormal   Collection Time: 07/15/19  3:12 PM  Result Value Ref Range   WBC 7.5 4.0 - 10.5 K/uL   RBC 3.82 (L) 4.22 - 5.81 MIL/uL   Hemoglobin 12.5 (L) 13.0 - 17.0 g/dL   HCT 39.9 39.0 - 52.0 %   MCV 104.5 (H) 80.0 - 100.0 fL   MCH 32.7 26.0 - 34.0 pg   MCHC 31.3 30.0 - 36.0 g/dL   RDW 14.3 11.5 - 15.5 %   Platelets 185 150 - 400 K/uL   nRBC 0.0 0.0 - 0.2 %   Neutrophils Relative % 70 %   Neutro Abs 5.3 1.7 - 7.7 K/uL   Lymphocytes Relative 16 %   Lymphs Abs 1.2 0.7 - 4.0 K/uL   Monocytes Relative 8 %   Monocytes Absolute 0.6 0.1 - 1.0 K/uL   Eosinophils Relative 4 %   Eosinophils Absolute 0.3 0.0 - 0.5 K/uL   Basophils Relative 1 %   Basophils Absolute 0.1 0.0 - 0.1 K/uL   Immature Granulocytes 1 %   Abs Immature Granulocytes 0.06 0.00 - 0.07 K/uL    Comment: Performed at [facility] [address], [place], [place] [address]  Brain natriuretic peptide     Status: Abnormal   Collection Time: 07/15/19  3:12 PM  Result Value Ref Range   B Natriuretic Peptide 298.0 (H) 0.0 - 100.0 pg/mL    Comment: Performed at [facility] [address], [place], [place]  [address]  SARS Coronavirus 2 [facility] order, Performed in [facility] hospital lab) Nasopharyngeal Nasopharyngeal Swab     Status: None   Collection Time: 07/15/19  5:03 PM   Specimen: Nasopharyngeal Swab  Result Value Ref Range   SARS Coronavirus 2 NEGATIVE NEGATIVE    Comment: (NOTE) If result is NEGATIVE SARS-CoV-2 target nucleic acids are NOT DETECTED. The SARS-CoV-2 RNA is generally detectable in upper and lower  respiratory specimens during the acute phase of infection. The lowest  concentration of SARS-CoV-2 viral copies this assay can detect is 250  copies / mL. A negative result does not preclude SARS-CoV-2 infection  and should not be used as the sole basis for treatment or other  patient management decisions.  A negative result may occur with  improper specimen collection / handling, submission of specimen other  than nasopharyngeal swab, presence of viral mutation(s) within the  areas targeted by this assay, and inadequate number of viral copies  (<250 copies / mL). A negative result must be combined with clinical  observations, patient history, and epidemiological information. If result is POSITIVE SARS-CoV-2 target nucleic acids are DETECTED. The SARS-CoV-2 RNA is generally detectable in upper and lower  respiratory specimens dur ing the acute phase of infection.  Positive  results are indicative of active infection with SARS-CoV-2.  Clinical  correlation with patient history and other diagnostic information is  necessary to determine patient infection status.  Positive results do  not rule out bacterial infection or co-infection with other viruses. If result is PRESUMPTIVE POSTIVE SARS-CoV-2 nucleic acids MAY BE PRESENT.   A presumptive positive result was obtained on the submitted specimen  and confirmed  on repeat testing.  While 2019 novel coronavirus  (SARS-CoV-2) nucleic acids may be present in the submitted sample  additional confirmatory testing may be necessary  for epidemiological  and / or clinical management purposes  to differentiate between  SARS-CoV-2 and other Sarbecovirus currently known to infect humans.  If clinically indicated additional testing with an alternate test  methodology [phone] is advised. The SARS-CoV-2 RNA is generally  detectable in upper and lower respiratory sp ecimens during the acute  phase of infection. The expected result is Negative. Fact Sheet for Patients:  BoilerBrush.com.cy Fact Sheet for Healthcare Providers: https://pope.com/ This test is not yet approved or cleared by the [place] FDA and has been authorized for detection and/or diagnosis of SARS-CoV-2 by FDA under an Emergency Use Authorization (EUA).  This EUA will remain in effect (meaning this test can be used) for the duration of the COVID-19 declaration under Section 564(b)(1) of the Act, 21 U.S.C. section 360bbb-3(b)(1), unless the authorization is terminated or revoked sooner. Performed at [facility], [address] [place], [place] [address]   Digoxin level     Status: Abnormal   Collection Time: 07/15/19  5:21 PM  Result Value Ref Range   Digoxin Level <0.2 (L) 0.8 - 2.0 ng/mL    Comment: RESULTS CONFIRMED BY MANUAL DILUTION Performed at [facility], [address] [place], [place] [address]   Urinalysis, Routine w reflex microscopic     Status: Abnormal   Collection Time: 07/15/19  5:58 PM  Result Value Ref Range   Color, Urine COLORLESS (A) YELLOW   APPearance CLEAR CLEAR   Specific Gravity, Urine 1.005 1.005 - 1.030   pH 6.0 5.0 - 8.0   Glucose, UA NEGATIVE NEGATIVE mg/dL   Hgb urine dipstick SMALL (A) NEGATIVE   Bilirubin Urine NEGATIVE NEGATIVE   Ketones, ur NEGATIVE NEGATIVE mg/dL   Protein, ur NEGATIVE NEGATIVE mg/dL   Nitrite NEGATIVE NEGATIVE   Leukocytes,Ua NEGATIVE NEGATIVE   RBC / HPF 0-5 0 - 5 RBC/hpf   Bacteria, UA RARE (A) NONE SEEN   Mucus  PRESENT    Hyaline Casts, UA PRESENT     Comment: Performed at [facility], [address], [place], [place] [address]   Dg Chest Port 1 View  Result Date: 07/15/2019 CLINICAL DATA:  Shortness of breath. EXAM: PORTABLE CHEST 1 VIEW COMPARISON:  Radiographs of May 03, 2019. FINDINGS: Stable cardiomegaly. Sternotomy wires are noted. No pneumothorax is noted. Stable bibasilar atelectasis or scarring is noted with probable small pleural effusions. Bony thorax is unremarkable. IMPRESSION: Stable bibasilar subsegmental atelectasis or scarring is noted with probable small pleural effusions. Electronically Signed   By: Lupita Raider M.D.   On: 07/15/2019 15:20    Pending Labs Unresulted Labs (From admission, onward)   None      Vitals/Pain Today's Vitals   07/15/19 1730 07/15/19 1807 07/15/19 1810 07/15/19 1830  BP:   (!) 145/103 (!) 128/98  Pulse: 88 73 69 87  Resp: (!) Temp:      TempSrc:      SpO2: 97% 96% 96% 95%  Weight:      Height:      PainSc:        Isolation Precautions Airborne and Contact precautions  Medications Medications  furosemide (LASIX) injection 40 mg (40 mg Intravenous Given 07/15/19 1630)    Mobility walks with device High fall risk   Focused Assessments Pulmonary Assessment  Handoff:  Lung sounds: Bilateral Breath Sounds: Coarse crackles L Breath Sounds: Coarse crackles R Breath Sounds: Coarse crackles O2 Device: Room Air O2 Flow Rate (L/min): 2 L/min      R Recommendations: See Admitting Provider Note  Report given to:   Additional Notes:

## 2019-07-15 NOTE — ED Notes (Signed)
Attempted report 

## 2019-07-15 NOTE — ED Provider Notes (Signed)
[facility] Provider Note   CSN: [id number] Arrival date & time: 07/15/19  1410     History   Chief Complaint No chief complaint on file.   HPI Charles Mckinney is a 68 y.o. male.     HPI Presents with dyspnea, fatigue.  Patient actually to his physician's office today, was sent here for evaluation. He acknowledges multiple medical issues including congestive heart failure. He is at least supposed to take Lasix daily. He states that he does take most of his medication regularly. He is not on home oxygen, but notes that with provision of oxygen after arrival he is feeling better. No focal pain. No fever, no chills, no nausea, no vomiting. He notes that he is previously been hospitalized, and and rehabilitation which resulted in both weight loss and improved functionality. However, he currently lives at home, notes some difficulty with obtaining his medication, and decrease, including increased weakness and difficulty breathing over the past 3 or 4 days at least.    Past Medical History:  Diagnosis Date  . Anxiety   . 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 --> CABG x 4: LIMA-LAD, SVG-Diag, SeqSVG-rPL-rPDA --> Intra-Op TEE showed EF of 30% with only mild MR.   . 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  Suggestion of anterior infarct on Myoview/echo.  . Congestive heart failure (CHF) (HCC)   . GERD (gastroesophageal reflux disease)   . Hyperlipidemia   . Hypertension   . Hypertensive heart disease    Mild to moderately dilated LV with mild LVH.  . Ischemic cardiomyopathy 01/2014   EF 30-35%.  Severe multivessel CAD noted on cath--CABG x4 -> Myoview (June 2018) shows large anterior-septal-apical  infarct with no ischemia.  . Kidney stones   . Obesity (BMI 30-39.9)   . Osteoarthritis   . Permanent atrial fibrillation    On Amiodarone & Beta Blocker (on Xarelto)  . PONV (postoperative nausea and vomiting)     Patient Active Problem List   Diagnosis Date Noted  . AAA (abdominal aortic aneurysm) without rupture (HCC) 03/14/2019  . Ischemic cardiomyopathy 02/01/2018  . OSA (obstructive sleep apnea) 10/04/2017  . Essential hypertension 05/30/2017  . Gout 05/30/2017  . Chest pain 05/29/2017  . CKD (chronic kidney disease), stage III (HCC) 05/29/2017  . TIA (transient ischemic attack) 04/26/2017  . Hypokalemia 04/26/2017  . Prolonged QT interval 04/26/2017  . Coronary artery disease involving native coronary artery of native heart with angina pectoris (HCC) 02/02/2016  . 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 Chronic combined systolic and diastolic CHF, NYHA class 3 (HCC) 01/19/2015  . Long term current use of anticoagulant therapy 01/19/2015  . Persistent atrial fibrillation (HCC): CHA2DS2Vasc 6   . GERD 11/22/2007  . Hyperlipidemia with target LDL less than 70   . Morbid obesity (HCC)     Past Surgical History:  Procedure Laterality Date  . CARDIAC CATHETERIZATION N/A 04/09/2015   Procedure: Right/Left Heart Cath and Coronary Angiography;  Surgeon: Orpah CobbAjay Kadakia, MD;  Location: [facility] INVASIVE CV LAB CUPID::  LM 95%; ostLAD 99% - p-dLAD 80%, D1 99% - D2 80%; OCx 90%,pCx 50%; pRCA 80%, mRCA 50%, rPDA 80% & 90%.  . CARDIOVERSION N/A  01/19/2015   Procedure: CARDIOVERSION;  Surgeon: Othella BoyerWilliam S Tilley, MD;  [facility];  Service: Cardiovascular;  Laterality: N/A;  pt in Afib, 12:22 synched cardioversion @  120 joules using Propofol 100 mg,IV....unsuccessful, repeated at 200 joules  . CARDIOVERSION N/A 04/02/2015   Procedure: CARDIOVERSION;  Surgeon: Orpah CobbAjay Kadakia, MD;  [facility];  Service: Cardiovascular;  Laterality: N/A;  . CATARACT EXTRACTION    . CLIPPING OF ATRIAL APPENDAGE   04/13/2015   Procedure: CLIPPING OF ATRIAL APPENDAGE;  Surgeon: Alleen BorneBryan K Bartle, MD;  Location: [facility] OR;  Service: Open Heart Surgery;;  . CORONARY ARTERY BYPASS GRAFT N/A 04/13/2015   Procedure: CORONARY ARTERY BYPASS GRAFTING (CABG);  Surgeon: Alleen BorneBryan K Bartle, MD;  [facility];  Service: Open Heart Surgery;; LIMA-LAD, SVG-Diag, SeqSVG-rPL-rPDA   . NM MYOVIEW LTD  05/2017   EF 27%.  No reversible ischemia.  Large anterior-apical and septal infarct.  Diffuse LV HK and moderate LV dilation.  . TEE WITHOUT CARDIOVERSION N/A 04/13/2015   Procedure: TRANSESOPHAGEAL ECHOCARDIOGRAM (TEE);  Surgeon: Alleen BorneBryan K Bartle, MD;  [facility];  Service: Open Heart Surgery;  Laterality: N/A;  . TRANSTHORACIC ECHOCARDIOGRAM  2/'15, 2/'16, 4/'16   a) EF 30-35%.  Mild LVH.  GRII DD.  Mild LA dilation.  Moderate diastolic dysfunction. b) Technically difficult.  Mild LVH.  EF 35%.  Diffuse HK.  Moderate LA dilation.  Mildly dilated RV with mildly reduced function.  c)   Mild concentric LVH.  EF 30-35%, diffuse HK.  Severe HK of mid apical inferior wall.  Moderate LA dilation.  . TRANSTHORACIC ECHOCARDIOGRAM N/A 1/18, 5/18, 7/18   a) Moderate severely reduced LV.  EF 30 of 35% with diffuse HK.  Mild MR.  Mildly dilated RA.;; b) (In setting of TIA).  Technically difficult mild to moderate globally reduced LV function.  Mild LVH.  Mild MR.  EF estimated 40-45%.  Moderate LA dilation.;; c)  Afib. Mild to mod dilated LV with mild LVH. EF 30-35% with diffuse HK. Mildly dilated RV with mildly decrease Systolic Fxn. Mild MR        Home Medications    Prior to Admission medications   Medication Sig Start Date End Date Taking? Authorizing Provider  albuterol (PROAIR HFA) 108 (90 Base) MCG/ACT inhaler INHALE 1 PUFF INTO THE LUNGS EVERY 6 HOURS AS NEEDED FOR WHEEZING OR SHORTNESS OF BREATH 10/31/17   Gordy SaversKwiatkowski, Peter F, MD  albuterol (PROVENTIL) (2.5 MG/3ML) 0.083% nebulizer solution USE ONE VIAL USING NEBULIZER EVERY 6 (SIX)  HOURS AS NEEDED FOR WHEEZING OR SHORTNESS OF BREATH. Patient taking differently: Take 2.5 mg by nebulization every 6 (six) hours as needed for wheezing or shortness of breath.  06/19/18   Gordy SaversKwiatkowski, Peter F, MD  allopurinol (ZYLOPRIM) 100 MG tablet TAKE 2 TABLETS(200 MG) BY MOUTH DAILY Patient taking differently: Take 200 mg by mouth daily.  09/21/18   Roderick Peeodd, Jeffrey A, MD  amiodarone (PACERONE) 200 MG tablet TAKE 1 TABLET(200 MG) BY MOUTH DAILY Patient taking differently: Take 200 mg by mouth daily.  09/21/18   Roderick Peeodd, Jeffrey A, MD  apixaban (ELIQUIS) 2.5 MG TABS tablet Take 1 tablet (2.5 mg total) by mouth 2 (two) times daily. 01/03/18   Gordy SaversKwiatkowski, Peter F, MD  carvedilol (COREG) 12.5 MG tablet Take 1 tablet (12.5 mg total) by mouth 2 (two) times daily with a meal. 07/03/18   Azalee CourseMeng, Hao, PA  Cholecalciferol (VITAMIN D PO) Take 1 tablet by mouth daily.    [provider]  colchicine 0.6 MG tablet Take 0.6 mg by mouth 2 (two) times daily as needed (gout flare up).     [provider]  DIGOX 125 MCG tablet Take 1 tablet (0.125 mg total) by mouth daily. 07/17/18   Azalee Course, PA  fluticasone (FLONASE) 50 MCG/ACT nasal spray Place 1 spray into both nostrils daily.    [provider]  Fluticasone-Salmeterol (ADVAIR DISKUS) 250-50 MCG/DOSE AEPB Inhale 1 puff into the lungs 2 (two) times daily. Patient not taking: Reported on 05/03/2019 06/13/18   [place], Betty G, MD  furosemide (LASIX) 20 MG tablet Take 3 tablets (60 mg total) by mouth daily. May take an additional 20 mg tablet as needed daily for increase swelling Patient taking differently: Take 20-40 mg by mouth See admin instructions. 40mg  in the am and 20mg  in the pm 10/31/18 05/03/19  Marykay Lex, MD  nitroGLYCERIN (NITROSTAT) 0.4 MG SL tablet Place 0.4 mg under the tongue every 5 (five) minutes as needed for chest pain.    [provider]  potassium chloride SA (K-DUR,KLOR-CON) 20 MEQ tablet Take 1 tablet (20 mEq  total) by mouth daily. 07/24/18   Azalee Course, PA  pravastatin (PRAVACHOL) 40 MG tablet TAKE ONE TABLET BY MOUTH ONCE DAILY 01/16/19   Marykay Lex, MD  sacubitril-valsartan (ENTRESTO) 97-103 MG Take 1 tablet by mouth 2 (two) times daily. 10/31/18   Marykay Lex, MD    Family History Family History  Problem Relation Age of Onset  . Diabetes Mother   . Heart disease Father   . Congestive Heart Failure Sister   . Stroke Brother   . Heart disease Brother   . Diabetes Brother     Social History Social History   Tobacco Use  . Smoking status: Former Smoker    Quit date: 2000    Years since quitting: 20.6  . Smokeless tobacco: Never Used  Substance Use Topics  . Alcohol use: No  . Drug use: No     Allergies   Niacin and related, Xarelto [rivaroxaban], and Penicillins   Review of Systems Review of Systems  Constitutional:       Per HPI, otherwise negative  HENT:       Per HPI, otherwise negative  Respiratory:       Per HPI, otherwise negative  Cardiovascular:       Per HPI, otherwise negative  Gastrointestinal: Negative for vomiting.  Endocrine:       Negative aside from HPI  Genitourinary:       Neg aside from HPI   Musculoskeletal:       Per HPI, otherwise negative  Skin: Positive for color change.  Neurological: Positive for weakness. Negative for syncope.     Physical Exam Updated Vital Signs BP (!) 144/127   Pulse (!) 101   Temp 98.9 F (37.2 C) (Oral)   Resp 15   Ht 5\' 10"  (1.778 m)   Wt (!) 160.6 kg   SpO2 99%   BMI 50.79 kg/m   Physical Exam Vitals signs and nursing note reviewed.  Constitutional:      Appearance: He is well-developed. He is obese. He is ill-appearing.  HENT:     Head: Normocephalic and atraumatic.  Eyes:     Conjunctiva/sclera: Conjunctivae normal.  Cardiovascular:     Rate and Rhythm: Tachycardia present. Rhythm irregular.  Pulmonary:     Effort: No respiratory distress.     Breath sounds: No stridor. Wheezing  present.  Comments: With nasal cannula in place saturation is 95%. Abdominal:     General: There is no distension.     Tenderness: There is no abdominal tenderness.     Comments: Soft, reducible hernia.  Musculoskeletal:     Right lower leg: Edema present.     Left lower leg: Edema present.  Skin:    General: Skin is warm and dry.     Comments: Weeping lesions both lower extremities with substantial edema, no gross erythema.  Dressing in place in both calves.  Neurological:     Mental Status: He is alert and oriented to person, place, and time.     Motor: Atrophy present.     Comments: Substantial atrophy. Cranial nerves grossly intact, though the patient's speech pattern is very difficult to understand, garbled.      ED Treatments / Results  Labs (all labs ordered are listed, but only abnormal results are displayed) Labs Reviewed  COMPREHENSIVE METABOLIC PANEL - Abnormal; Notable for the following components:      Result Value   Creatinine, Ser 1.49 (*)    Calcium 8.7 (*)    Total Protein 6.0 (*)    Albumin 3.4 (*)    GFR calc non Af Amer 48 (*)    GFR calc Af Amer 55 (*)    All other components within normal limits  CBC WITH DIFFERENTIAL/PLATELET - Abnormal; Notable for the following components:   RBC 3.82 (*)    Hemoglobin 12.5 (*)    MCV 104.5 (*)    All other components within normal limits  URINALYSIS, ROUTINE W REFLEX MICROSCOPIC  BRAIN NATRIURETIC PEPTIDE    EKG EKG Interpretation  Date/Time:  Monday July 15 2019 14:19:58 EDT Ventricular Rate:  98 PR Interval:    QRS Duration: 139 QT Interval:  400 QTC Calculation: 511 R Axis:   -70 Text Interpretation:  Age not entered, assumed to be  68 years old for purpose of ECG interpretation Atrial fibrillation Right bundle branch block Inferior infarct, old No significant change since last tracing Abnormal ECG Confirmed by Gerhard MunchLockwood, Marylou Wages [phone]([id number]) on 07/15/2019 3:09:22 PM   Radiology Dg Chest Port 1 View   Result Date: 07/15/2019 CLINICAL DATA:  Shortness of breath. EXAM: PORTABLE CHEST 1 VIEW COMPARISON:  Radiographs of May 03, 2019. FINDINGS: Stable cardiomegaly. Sternotomy wires are noted. No pneumothorax is noted. Stable bibasilar atelectasis or scarring is noted with probable small pleural effusions. Bony thorax is unremarkable. IMPRESSION: Stable bibasilar subsegmental atelectasis or scarring is noted with probable small pleural effusions. Electronically Signed   By: Lupita RaiderJames  Green Jr M.D.   On: 07/15/2019 15:20    Procedures Procedures (including critical care time)  Medications Ordered in ED Medications - No data to display   Initial Impression / Assessment and Plan / ED Course  I have reviewed the triage vital signs and the nursing notes.  Pertinent labs & imaging results that were available during my care of the patient were reviewed by me and considered in my medical decision making (see chart for details).    With concern for fluid overload status given the patient's new oxygen requirement, he will receive empiric Lasix.    Update:, Patient has received IV Lasix. Labs notable for slight elevation in creatinine, though not substantially so. Labs otherwise generally initially reassuring, no x-ray evidence for pneumonia, no fever.  This elderly male with heart failure, multiple other medical problems presents with new dyspnea, is found to have new oxygen requirement, after being found  to be hypoxic, 80% on room air. With 2 L nasal cannula, patient improved to 95%.  Patient is awake, alert, mentating well, afebrile, no x-ray evidence for pneumonia. Given his grossly fluid overloaded status, after initial Lasix, provision of oxygen, he will require admission for ongoing therapy. [name] COVID test pending on admission.  Final Clinical Impressions(s) / ED Diagnoses   Final diagnoses:  Acute on chronic congestive heart failure, unspecified heart failure type [facility])     Carmin Muskrat, MD 07/15/19 1757

## 2019-07-15 NOTE — ED Notes (Signed)
Culture sent in addition to UA 

## 2019-07-16 ENCOUNTER — Encounter (HOSPITAL_COMMUNITY): Payer: Self-pay | Admitting: General Practice

## 2019-07-16 ENCOUNTER — Other Ambulatory Visit: Payer: Self-pay

## 2019-07-16 DIAGNOSIS — I509 Heart failure, unspecified: Secondary | ICD-10-CM

## 2019-07-16 DIAGNOSIS — I10 Essential (primary) hypertension: Secondary | ICD-10-CM

## 2019-07-16 DIAGNOSIS — I4811 Longstanding persistent atrial fibrillation: Secondary | ICD-10-CM

## 2019-07-16 LAB — BASIC METABOLIC PANEL
Anion gap: 10 (ref 5–15)
BUN: 19 mg/dL (ref 8–23)
CO2: 30 mmol/L (ref 22–32)
Calcium: 8.5 mg/dL — ABNORMAL LOW (ref 8.9–10.3)
Chloride: 103 mmol/L (ref 98–111)
Creatinine, Ser: 1.61 mg/dL — ABNORMAL HIGH (ref 0.61–1.24)
GFR calc Af Amer: 50 mL/min — ABNORMAL LOW (ref >60)
GFR calc non Af Amer: 43 mL/min — ABNORMAL LOW (ref >60)
Glucose, Bld: 116 mg/dL — ABNORMAL HIGH (ref 70–99)
Potassium: 3.9 mmol/L (ref 3.5–5.1)
Sodium: 143 mmol/L (ref 135–145)

## 2019-07-16 LAB — HIV ANTIBODY (ROUTINE TESTING W REFLEX): HIV Screen 4th Generation wRfx: NONREACTIVE

## 2019-07-16 LAB — DIGOXIN LEVEL: Digoxin Level: <0.2 ng/mL — ABNORMAL LOW (ref 0.8–2.0)

## 2019-07-16 LAB — TSH: TSH: 81.127 u[IU]/mL — ABNORMAL HIGH (ref 0.350–4.500)

## 2019-07-16 MED ORDER — FUROSEMIDE 10 MG/ML IJ SOLN
120.0000 mg | Freq: Two times a day (BID) | INTRAVENOUS | Status: DC
  Administered 2019-07-16 – 2019-07-17 (×2): 120 mg via INTRAVENOUS
  Filled 2019-07-16: qty 10
  Filled 2019-07-16 (×2): qty 12

## 2019-07-16 MED ORDER — FUROSEMIDE 10 MG/ML IJ SOLN
80.0000 mg | Freq: Every day | INTRAMUSCULAR | Status: DC
  Administered 2019-07-16: 80 mg via INTRAVENOUS
  Filled 2019-07-16: qty 8

## 2019-07-16 MED ORDER — CARVEDILOL 6.25 MG PO TABS
6.2500 mg | ORAL_TABLET | Freq: Two times a day (BID) | ORAL | Status: DC
  Administered 2019-07-16 – 2019-07-18 (×2): 6.25 mg via ORAL
  Filled 2019-07-16 (×4): qty 1

## 2019-07-16 NOTE — Evaluation (Signed)
Physical Therapy Evaluation Patient Details Name: Charles Mckinney MRN: [id number] DOB: [birth year] Today's Date: 07/16/2019   History of Present Illness  68 yo admitted with SOB and CHF exacerbation. PMH is significant for CAD s/p CABG x4, HTN, HLD, GERD, A fib on eliquis, HFrEF (EF 30-35%), gout, OSA  Clinical Impression  Pt in chair on arrival reporting he has ben managing at home but not well. Pt is not able to successfully clean, prepare meals, bath his lower body or walk more than a few feet without having to grab onto objects due to near falls. Pt Relies or intermittent assist from ex-wife and does not have more sufficient assist at home. Pt with decreased strength, gait, transfers, cognition with impaired speech who is very difficult to understand. Pt will benefit from acute therapy to maximize mobility, function and safety to decrease burden of care.   Pt on 2L on arrival with SpO2 98% with drop to 90% on RA at rest. With limited 10' of ambulation pt 89% on Ra. Pt on 1 L end of session 95%. HR 71-95 BP 152/115 end of session    Follow Up Recommendations SNF;Supervision/Assistance - 24 hour    Equipment Recommendations  Wheelchair (measurements PT);Wheelchair cushion (measurements PT)    Recommendations for Other Services       Precautions / Restrictions Precautions Precautions: Fall Restrictions Weight Bearing Restrictions: No      Mobility  Bed Mobility               General bed mobility comments: sitting in chair without armrests on arrival  Transfers Overall transfer level: Needs assistance   Transfers: Sit to/from Stand Sit to Stand: Min guard         General transfer comment: guarding for safety and equipment to rise, min assist to back fully to chair pt impulsive and rushing without following commands  Ambulation/Gait Ambulation/Gait assistance: Min assist;+2 safety/equipment Gait Distance (Feet): 10 Feet Assistive device: 4-wheeled walker Gait  Pattern/deviations: Step-through pattern;Decreased stride length;Wide base of support;Trunk flexed   Gait velocity interpretation: 1.31 - 2.62 ft/sec, indicative of limited community ambulator General Gait Details: pt with flexed trunk, wide BOS. pt able to walk around bed but then reported fatigue without grossly 3' left with pt placing self too posterior to rollator, rushing and not following commands with increased likelihood for a fall  Stairs            Wheelchair Mobility    Modified Rankin (Stroke Patients Only)       Balance Overall balance assessment: Needs assistance   Sitting balance-Leahy Scale: Poor Sitting balance - Comments: pt able to sit in chair with back support   Standing balance support: Bilateral upper extremity supported Standing balance-Leahy Scale: Poor Standing balance comment: reliant on bil UE support in standing                             Pertinent Vitals/Pain Pain Assessment: 0-10 Pain Score: 4  Pain Location: bil knee pain, aching Pain Descriptors / Indicators: Aching;Constant Pain Intervention(s): Limited activity within patient's tolerance;Repositioned;Monitored during session    Home Living Family/patient expects to be discharged to:: Private residence Living Arrangements: Alone   Type of Home: House Home Access: Ramped entrance;Stairs to enter   Entrance Stairs-Number of Steps: 4 Home Layout: One level Home Equipment: Walker - 4 wheels;Cane - single point;Hospital bed Additional Comments: rollator, cane, hospital bed    Prior Function Level of  Independence: Independent with assistive device(s);Needs assistance   Gait / Transfers Assistance Needed: walks up 38' with cane or rollator  ADL's / Homemaking Assistance Needed: has trouble washing  (sink baths on toilet), cleaning after toileting). pt prepares quick prep food like sandwiches  Comments: ex wife brings meals and cleans on occasion     Hand Dominance         Extremity/Trunk Assessment   Upper Extremity Assessment Upper Extremity Assessment: Defer to OT evaluation    Lower Extremity Assessment Lower Extremity Assessment: RLE deficits/detail;LLE deficits/detail RLE Deficits / Details: hip flexion 2-/5, knee extension 3/5, knee flexion 3/5 LLE Deficits / Details: hip flexion 2-/5, knee extension 3/5, knee flexion 3/5    Cervical / Trunk Assessment Cervical / Trunk Assessment: Kyphotic  Communication   Communication: Expressive difficulties  Cognition Arousal/Alertness: Awake/alert Behavior During Therapy: Flat affect Overall Cognitive Status: No family/caregiver present to determine baseline cognitive functioning Area of Impairment: Memory;Safety/judgement;Problem solving                         Safety/Judgement: Decreased awareness of safety;Decreased awareness of deficits   Problem Solving: Slow processing        General Comments      Exercises Total Joint Exercises Long Arc Quad: AROM;10 reps;Both;Seated Marching in Standing: AROM;Both;5 reps;Seated   Assessment/Plan    PT Assessment Patient needs continued PT services  PT Problem List Decreased strength;Decreased mobility;Decreased safety awareness;Decreased range of motion;Decreased activity tolerance;Decreased cognition;Decreased balance;Decreased knowledge of use of DME       PT Treatment Interventions DME instruction;Therapeutic activities;Cognitive remediation;Gait training;Therapeutic exercise;Patient/family education;Balance training;Functional mobility training    PT Goals (Current goals can be found in the Care Plan section)  Acute Rehab PT Goals Patient Stated Goal: go to [facility] to be with my sister PT Goal Formulation: With patient Time For Goal Achievement: 07/30/19 Potential to Achieve Goals: Fair    Frequency Min 2X/week   Barriers to discharge Decreased caregiver support      Co-evaluation PT/OT/SLP Co-Evaluation/Treatment:  Yes Reason for Co-Treatment: For patient/therapist safety;Necessary to address cognition/behavior during functional activity PT goals addressed during session: Mobility/safety with mobility;Balance;Proper use of DME         AM-PAC PT "6 Clicks" Mobility  Outcome Measure Help needed turning from your back to your side while in a flat bed without using bedrails?: A Little Help needed moving from lying on your back to sitting on the side of a flat bed without using bedrails?: A Little Help needed moving to and from a bed to a chair (including a wheelchair)?: A Little Help needed standing up from a chair using your arms (e.g., wheelchair or bedside chair)?: A Little Help needed to walk in hospital room?: A Little Help needed climbing 3-5 steps with a railing? : Total 6 Click Score: 16    End of Session Equipment Utilized During Treatment: Gait belt Activity Tolerance: Patient limited by fatigue Patient left: in chair;with call bell/phone within reach;with chair alarm set Nurse Communication: Mobility status PT Visit Diagnosis: Other abnormalities of gait and mobility (R26.89);Muscle weakness (generalized) (M62.81);History of falling (Z91.81)    Time: 2706-2376 PT Time Calculation (min) (ACUTE ONLY): 24 min   Charges:   PT Evaluation $PT Eval Moderate Complexity: 1 Mod          Gali Spinney Abner Greenspan, PT Acute Rehabilitation Services Pager: [phone] Office: [phone]   Neo Yepiz B Caileb Rhue 07/16/2019, 11:55 AM

## 2019-07-16 NOTE — Consult Note (Signed)
Cardiology Consultation:   Patient ID: Charles Mckinney; [id number]; 06/20/1951   Admit date: 07/15/2019 Date of Consult: 07/16/2019  Primary Care Provider: Dr Randa LynnLamb at [facility], [address], [place] Primary Cardiologist: Bryan Lemmaavid Harding, MD 03/14/2019 Televisit Primary Electrophysiologist:  None   Patient Profile:   Charles Mckinney is a 68 y.o. male with a hx of CABG 2016 w/ LIMA-LAD, SVG-Diag, SVG-PL-PDA, S-D-CHF w/ EF 30-35% by echo 2015, perm Afib on amio and Eliquis, HTN, HLD, GERD, OSA not on CPAP, gout, who is being seen today for the evaluation of CHF, Bradycardia at the request of Dr Jennette KettleNeal.  History of Present Illness:   Charles Mckinney was admitted 08/10 with SOB, this am, HR has been low, cards asked to see.  Charles Mckinney breathing has been bad for months. He has not have a way to weigh himself. Does not have scales and could not see them if he did. Does not look at the sodium in foods. Describes worsening LE edema, +orthopnea, +PND (but also has sleep apnea and did not tolerate CPAP).    He is upset because he urinates on himself every day. He uses a jug at times, but still urinates on himself.  He cannot get to the bathroom fast enough and gets very short of breath doing it.  He cannot get out of bed and get to another room without getting SOB. He finally called his PCP because the SOB got to him. Came to the ER as requested and was admitted.   Charles Annie Mckinney was at [facility] till just after July 4th for rehab.  He now lives alone. He does not have in-home help. His children or other family members do the cooking/shopping. However, he eats cans of soup or other canned foods for meals.   His activity level is very poor. He will drive to the end of the driveway to get the mail. Getting in/out of the truck is tiring and makes him SOB. He cannot do anything without getting SOB. Has been like this for months, not sure how long it has been this bad.  Thinks his dry weight is  approximately 330 pounds.  This may be accurate as his weight was 331 pounds on 05/03/2019.  He was brought to the ER that day because of being unable to care for himself.  However, his medical status was felt to be stable.  He was admitted to [facility] that day.   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 --> CABG x 4: LIMA-LAD, SVG-Diag, SeqSVG-rPL-rPDA --> Intra-Op TEE showed EF of 30% with only mild Charles.    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  Suggestion of anterior infarct on Myoview/echo.   GERD (gastroesophageal reflux disease)    Hyperlipidemia    Hypertension    Hypertensive heart disease    Mild to moderately dilated LV with mild LVH.   Ischemic cardiomyopathy 01/2014   EF 30-35%.  Severe multivessel CAD noted on cath--CABG x4 -> Myoview (June 2018) shows large anterior-septal-apical infarct with no ischemia.   Kidney stones    Obesity (BMI 30-39.9)    Osteoarthritis    Permanent atrial  fibrillation    On Amiodarone & Beta Blocker (on Xarelto)   PONV (postoperative nausea and vomiting)     Past Surgical History:  Procedure Laterality Date   CARDIAC CATHETERIZATION N/A 04/09/2015   Procedure: Right/Left Heart Cath and Coronary Angiography;  Surgeon: Dixie Dials, MD;  Location: [place] INVASIVE CV LAB CUPID::  LM 95%; ostLAD 99% - p-dLAD 80%, D1 99% - D2 80%; OCx 90%,pCx 50%; pRCA 80%, mRCA 50%, rPDA 80% & 90%.   CARDIOVERSION N/A 01/19/2015   Procedure: CARDIOVERSION;  Surgeon: Jacolyn Reedy, MD;  Location: [facility] ENDOSCOPY;  Service: Cardiovascular;  Laterality: N/A;  pt in Afib, 12:22 synched cardioversion @  120 joules using Propofol 100 mg,IV....unsuccessful, repeated at 200  joules   CARDIOVERSION N/A 04/02/2015   Procedure: CARDIOVERSION;  Surgeon: Dixie Dials, MD;  Location: [place];  Service: Cardiovascular;  Laterality: N/A;   CATARACT EXTRACTION     CLIPPING OF ATRIAL APPENDAGE  04/13/2015   Procedure: CLIPPING OF ATRIAL APPENDAGE;  Surgeon: Gaye Pollack, MD;  Location: [place];  Service: Open Heart Surgery;;   CORONARY ARTERY BYPASS GRAFT N/A 04/13/2015   Procedure: CORONARY ARTERY BYPASS GRAFTING (CABG);  Surgeon: Gaye Pollack, MD;  Location: [facility] OR;  Service: Open Heart Surgery;; LIMA-LAD, SVG-Diag, SeqSVG-rPL-rPDA    NM MYOVIEW LTD  05/2017   EF 27%.  No reversible ischemia.  Large anterior-apical and septal infarct.  Diffuse LV HK and moderate LV dilation.   TEE WITHOUT CARDIOVERSION N/A 04/13/2015   Procedure: TRANSESOPHAGEAL ECHOCARDIOGRAM (TEE);  Surgeon: Gaye Pollack, MD;  Location: [place];  Service: Open Heart Surgery;  Laterality: N/A;   TRANSTHORACIC ECHOCARDIOGRAM  2/'15, 2/'16, 4/'16   a) EF 30-35%.  Mild LVH.  GRII DD.  Mild LA dilation.  Moderate diastolic dysfunction. b) Technically difficult.  Mild LVH.  EF 35%.  Diffuse HK.  Moderate LA dilation.  Mildly dilated RV with mildly reduced function.  c)   Mild concentric LVH.  EF 30-35%, diffuse HK.  Severe HK of mid apical inferior wall.  Moderate LA dilation.   TRANSTHORACIC ECHOCARDIOGRAM N/A 1/18, 5/18, 7/18   a) Moderate severely reduced LV.  EF 30 of 35% with diffuse HK.  Mild Charles.  Mildly dilated RA.;; b) (In setting of TIA).  Technically difficult mild to moderate globally reduced LV function.  Mild LVH.  Mild Charles.  EF estimated 40-45%.  Moderate LA dilation.;; c)  Afib. Mild to mod dilated LV with mild LVH. EF 30-35% with diffuse HK. Mildly dilated RV with mildly decrease Systolic Fxn. Mild Charles     Prior to Admission medications   Medication Sig Start Date End Date Taking? Authorizing Provider  albuterol (PROAIR HFA) 108 (90 Base) MCG/ACT inhaler INHALE 1 PUFF INTO THE LUNGS EVERY 6  HOURS AS NEEDED FOR WHEEZING OR SHORTNESS OF BREATH 10/31/17  Yes Marletta Lor, MD  albuterol (PROVENTIL) (2.5 MG/3ML) 0.083% nebulizer solution USE ONE VIAL USING NEBULIZER EVERY 6 (SIX) HOURS AS NEEDED FOR WHEEZING OR SHORTNESS OF BREATH. Patient taking differently: Take 2.5 mg by nebulization every 6 (six) hours as needed for wheezing or shortness of breath.  06/19/18  Yes Marletta Lor, MD  amiodarone (PACERONE) 200 MG tablet TAKE 1 TABLET(200 MG) BY MOUTH DAILY Patient taking differently: Take 200 mg by mouth daily.  09/21/18  Yes Dorena Cookey, MD  apixaban (ELIQUIS) 2.5 MG TABS tablet Take 1 tablet (2.5 mg total) by mouth 2 (two) times daily. 01/03/18  Yes Bluford Kaufmann  F, MD  carvedilol (COREG) 12.5 MG tablet Take 1 tablet (12.5 mg total) by mouth 2 (two) times daily with a meal. 07/03/18  Yes Meng, Blue Island[place], PA  DIGOX 125 MCG tablet Take 1 tablet (0.125 mg total) by mouth daily. 07/17/18  Yes Meng, Wynema BirchHao, PA  fluticasone (FLONASE) 50 MCG/ACT nasal spray Place 1 spray into both nostrils daily.   Yes [provider]  Fluticasone-Salmeterol (ADVAIR DISKUS) 250-50 MCG/DOSE AEPB Inhale 1 puff into the lungs 2 (two) times daily. 06/13/18  Yes [place], Betty G, MD  potassium chloride SA (K-DUR,KLOR-CON) 20 MEQ tablet Take 1 tablet (20 mEq total) by mouth daily. 07/24/18  Yes Meng, Wynema BirchHao, PA  pravastatin (PRAVACHOL) 40 MG tablet TAKE ONE TABLET BY MOUTH ONCE DAILY 01/16/19  Yes Marykay LexHarding, David W, MD  sacubitril-valsartan (ENTRESTO) 97-103 MG Take 1 tablet by mouth 2 (two) times daily. 10/31/18  Yes Marykay LexHarding, David W, MD  allopurinol (ZYLOPRIM) 100 MG tablet TAKE 2 TABLETS(200 MG) BY MOUTH DAILY Patient taking differently: Take 200 mg by mouth daily.  09/21/18   Roderick Peeodd, Jeffrey A, MD  carvedilol (COREG) 6.25 MG tablet  07/03/19   [provider]  Cholecalciferol (VITAMIN D PO) Take 1 tablet by mouth daily.    [provider]  colchicine 0.6 MG tablet Take 0.6 mg by mouth 2  (two) times daily as needed (gout flare up).     [provider]  furosemide (LASIX) 20 MG tablet Take 3 tablets (60 mg total) by mouth daily. May take an additional 20 mg tablet as needed daily for increase swelling Patient taking differently: Take 20-40 mg by mouth See admin instructions. 40mg  in the am and 20mg  in the pm 10/31/18 07/15/19  Marykay LexHarding, David W, MD  nitroGLYCERIN (NITROSTAT) 0.4 MG SL tablet Place 0.4 mg under the tongue every 5 (five) minutes as needed for chest pain.    [provider]  spironolactone (ALDACTONE) 25 MG tablet Take 25 mg by mouth See admin instructions. Take 25 mg by mouth once a day for 14 days 07/10/19   [provider]    Inpatient Medications: Scheduled Meds:  amiodarone  200 mg Oral Daily   apixaban  2.5 mg Oral BID   carvedilol  6.25 mg Oral BID WC   furosemide  80 mg Intravenous Daily   mometasone-formoterol  2 puff Inhalation BID   potassium chloride SA  20 mEq Oral Daily   pravastatin  40 mg Oral Daily   sacubitril-valsartan  1 tablet Oral BID   sodium chloride flush  3 mL Intravenous Q12H   Continuous Infusions:  sodium chloride     PRN Meds: sodium chloride, acetaminophen, sodium chloride flush  Allergies:    Allergies  Allergen Reactions   Niacin And Related Other (See Comments)    FLUSHING   Xarelto [Rivaroxaban] Other (See Comments)    Cause bloody stools. Switched to eliquis low dose due to bleeding.    Penicillins Itching, Rash and Other (See Comments)    Has patient had a PCN reaction causing immediate rash, facial/tongue/throat swelling, SOB or lightheadedness with hypotension: Yes Has patient had a PCN reaction causing severe rash involving mucus membranes or skin necrosis: No Has patient had a PCN reaction that required hospitalization: No Has patient had a PCN reaction occurring within the last 10 years: No If all of the above answers are "NO", then may proceed with Cephalosporin use.      Social History:   Social History   Socioeconomic History  Marital status: Divorced    Spouse name: Not on file   Number of children: Not on file   Years of education: Not on file   Highest education level: Not on file  Occupational History   Occupation: disabled  Social Network engineer strain: Not on file   Food insecurity    Worry: Not on file    Inability: Not on file   Transportation needs    Medical: Not on file    Non-medical: Not on file  Tobacco Use   Smoking status: Former Smoker    Quit date: 2000    Years since quitting: 20.6   Smokeless tobacco: Never Used  Substance and Sexual Activity   Alcohol use: No   Drug use: No   Sexual activity: Yes  Lifestyle   Physical activity    Days per week: Not on file    Minutes per session: Not on file   Stress: Not on file  Relationships   Social connections    Talks on phone: Not on file    Gets together: Not on file    Attends religious service: Not on file    Active member of club or organization: Not on file    Attends meetings of clubs or organizations: Not on file    Relationship status: Not on file   Intimate partner violence    Fear of current or ex partner: Not on file    Emotionally abused: Not on file    Physically abused: Not on file    Forced sexual activity: Not on file  Other Topics Concern   Not on file  Social History Narrative   Not on file    Family History:   Family History  Problem Relation Age of Onset   Diabetes Mother    Heart disease Father    Congestive Heart Failure Sister    Stroke Brother    Heart disease Brother    Diabetes Brother    Family Status:  Family Status  Relation Name Status   Mother  Deceased at age 12       complications of surgery   Father  Deceased at age 76       died of MI   Sister  Alive       CHF   Brother  Alive       CABG   Brother  Alive       history of CVA, lives in [place]    ROS:  Please see the  history of present illness.  All other ROS reviewed and negative.     Physical Exam/Data:   Vitals:   07/16/19 0023 07/16/19 0353 07/16/19 0951 07/16/19 0959  BP: 129/86 115/79 105/75   Pulse: 99 (!) 52  72  Resp: 20 20    Temp: 98 F (36.7 C) 98.2 F (36.8 C)    TempSrc: Oral Oral    SpO2: 96% 97%    Weight:      Height:        Intake/Output Summary (Last 24 hours) at 07/16/2019 1229 Last data filed at 07/16/2019 1116 Gross per 24 hour  Intake 480 ml  Output 800 ml  Net -320 ml   Filed Weights   07/15/19 1418 07/15/19 2048  Weight: (!) 160.6 kg (!) 158.7 kg   Body mass index is 50.21 kg/m.  General:  Well nourished, morbidly obese male, in no acute distress HEENT: normal Lymph: no adenopathy Neck: no JVD seen,  difficult to assess secondary to body habitus Endocrine:  No thryomegaly Vascular: No carotid bruits; upper extremity pulses 2+, unable to palpate pedal pulses due to edema Cardiac:  normal S1, S2; irregular rate and rhythm; no murmur  Lungs: Decreased breath sounds bases bilaterally, no wheezing, rhonchi or rales  Abd: Obese, soft, nontender, no hepatomegaly  Ext: 2-3+ lower extremity edema, wrappings on lower legs and feet were not disturbed Musculoskeletal:  No deformities, BUE and BLE strength normal and equal Skin: warm and dry  Neuro:  CNs 2-12 intact, no focal abnormalities noted Psych:  Normal affect   EKG:  The EKG was personally reviewed and demonstrates:  08/11 ECG is Atrial fib, HR 85, RBBB is old Telemetry:  Telemetry was personally reviewed and demonstrates: Atrial fib with slow ventricular response at times, some pauses are over 2 seconds, but no pauses over 3 seconds.  Heart rate is not sustained less than 50  Relevant CV Studies:  ECHO: 10/18/2018 - Procedure narrative: Technically difficult study with poor echo   windows. Definity contrast not given. - Left ventricle: The cavity size was normal. Wall thickness was   increased in a pattern  of mild LVH. Systolic function was   moderately to severely reduced. The estimated ejection fraction   was in the range of 30% to 35%. Diffuse hypokinesis. The study is   not technically sufficient to allow evaluation of LV diastolic   function. - Mitral valve: Mildly thickened leaflets . There was mild   regurgitation. - Left atrium: The atrium was normal in size. - Atrial septum: No defect or patent foramen ovale was identified. - Inferior vena cava: The vessel was normal in size. The   respirophasic diameter changes were in the normal range (>= 50%),   consistent with normal central venous pressure.  Impressions:  - Technically difficult study. Compared to a prior study in 2018,   the LVEF is unchanged at 30-35% wtih global hypokinesis.  MYOVIEW: 05/30/2017 IMPRESSION: 1. No evidence of reversible myocardial ischemia. Large infarct involving the anterior, apical, and septal walls.  2. Diffuse left ventricular hypokinesis with moderate left ventricular dilatation.  3. Left ventricular ejection fraction 27%  4. Non invasive risk stratification*: High   CATH: 04/09/2015 (pre-CABG)  LM lesion, 95% stenosed.  Ost LAD lesion, 99% stenosed.  Ost Cx lesion, 90% stenosed.  Prox Cx lesion, 50% stenosed.  Mid Cx lesion, 70% stenosed.  Prox LAD to Dist LAD lesion, 80% stenosed.  1st Diag-1 lesion, 99% stenosed.  1st Diag-2 lesion, 80% stenosed.  Prox RCA lesion, 80% stenosed.  RPDA-1 lesion, 80% stenosed.  RPDA-2 lesion, 90% stenosed.  Mid RCA lesion, 50% stenosed.  Left internal mammary artery injection showed patent LIMA.   Patient will be admitted to step down unit with IV NTG and referred to CVTS surgeon for [facility]. Will hold Xarelto for now and continue IV heparin.  Laboratory Data:  Chemistry Recent Labs  Lab 07/15/19 1512 07/16/19 0337  NA 142 143  K 3.9 3.9  CL 106 103  CO2 26 30  GLUCOSE 98 116*  BUN 20 19   CREATININE 1.49* 1.61*  CALCIUM 8.7* 8.5*  GFRNONAA 48* 43*  GFRAA 55* 50*  ANIONGAP 10 10    Lab Results  Component Value Date   ALT 26 07/15/2019   AST 26 07/15/2019   ALKPHOS 39 07/15/2019   BILITOT 0.8 07/15/2019   Hematology Recent Labs  Lab 07/15/19 1512  WBC 7.5  RBC 3.82*  HGB 12.5*  HCT 39.9  MCV 104.5*  MCH 32.7  MCHC 31.3  RDW 14.3  PLT 185   Cardiac Enzymes High Sensitivity Troponin:  No results for input(s): TROPONINIHS in the last 720 hours.    BNP Recent Labs  Lab 07/15/19 1512  BNP 298.0*   Digoxin Level  Date Value Ref Range Status  07/16/2019 <0.2 (L) 0.8 - 2.0 ng/mL Final    Comment:    RESULTS CONFIRMED BY MANUAL DILUTION Performed at [facility], [address] [place], [place] [address]     TSH:  Lab Results  Component Value Date   TSH 5.204 (H) 04/05/2015   Lipids: Lab Results  Component Value Date   CHOL 191 05/30/2017   HDL 43 05/30/2017   LDLCALC 99 05/30/2017   LDLDIRECT 164.8 09/12/2006   TRIG 247 (H) 05/30/2017   CHOLHDL 4.4 05/30/2017   HgbA1c: Lab Results  Component Value Date   HGBA1C 6.4 06/13/2018   Magnesium:  Magnesium  Date Value Ref Range Status  05/31/2017 1.9 1.7 - 2.4 mg/dL Final     Radiology/Studies:  Dg Chest Port 1 View  Result Date: 07/15/2019 CLINICAL DATA:  Shortness of breath. EXAM: PORTABLE CHEST 1 VIEW COMPARISON:  Radiographs of May 03, 2019. FINDINGS: Stable cardiomegaly. Sternotomy wires are noted. No pneumothorax is noted. Stable bibasilar atelectasis or scarring is noted with probable small pleural effusions. Bony thorax is unremarkable. IMPRESSION: Stable bibasilar subsegmental atelectasis or scarring is noted with probable small pleural effusions. Electronically Signed   By: Lupita Raider M.D.   On: 07/15/2019 15:20    Assessment and Plan:   1.  Acute on chronic combined systolic and diastolic CHF - He seems to have mostly right heart failure. -Massive lower  extremity edema and likely abdominal edema as well. -His weight is up at least 20 pounds - With his elevated creatinine, may have to use higher dose of Lasix to get good urine output.  For Dr. [place], go to 120 mg IV twice daily Lasix - If higher dose of Lasix does not lead to effective diuresis, add metolazone -Continue to follow BMET, intake/output and weights daily.  2.  Atrial fibrillation: - He had some bradycardia, with pauses between 2 and 3 seconds, but did not see any sustained heart rate less than 50 on telemetry review. - Prior to admission, he was on amiodarone 200 mg daily and dig plus carvedilol 12.5 mg twice daily. -However, his digoxin level is extremely low, he may not be compliant with it, discontinued by [facility] - With the bradycardia, would discontinue the amiodarone -His carvedilol has already been decreased from 12.5 mg twice daily down to 6.25 mg twice daily - Follow heart rate on reduced meds -No indication for pacemaker at this time  3.  Chronic kidney disease, stage III - Follow creatinine daily while diuresing  Otherwise, per Family Medicine team Active Problems:   CKD (chronic kidney disease), stage III (HCC)   Essential hypertension   Ischemic cardiomyopathy   Coronary artery disease involving native coronary artery of native heart with angina pectoris (HCC)   AAA (abdominal aortic aneurysm) without rupture (HCC)   CHF exacerbation (HCC)     For questions or updates, please contact [facility] Please consult [email or web address] for contact info under Cardiology/STEMI.   Melida Quitter, PA-C  07/16/2019 12:29 PM

## 2019-07-16 NOTE — Evaluation (Signed)
Occupational Therapy Evaluation Patient Details Name: Charles Mckinney MRN: [id number] DOB: 1951-08-04 Today's Date: 07/16/2019    History of Present Illness 68 yo admitted with SOB and CHF exacerbation. PMH is significant for CAD s/p CABG x4, HTN, HLD, GERD, A fib on eliquis, HFrEF (EF 30-35%), gout, OSA   Clinical Impression   PTA patient reports living alone, managing ADLs/ IADLs/mobiilty but not well.  He is able to walk with rollator/cane short distances, not able to clean, prepare meals or wash B LEs. He reports having intermittent assistance from ex-wife, but inconsistent.  Patient admitted for above and limited by problem list below, including impaired balance, decreased strength, decreased endurance and poor safety awareness.  VSS during session, on 2L via [place] upon entry with SpO2 at 98% on RA with drop to 90% at rest; HR 71-95, BP 152/115 at end of session.   He requires min assist for UB ADLS, max assist for LB ADls and min guard for transfers. He will benefit from continued OT services while admitted and after dc at SNF level in order to maximize independence and safety with ADLs/mobility.     Follow Up Recommendations  SNF;Supervision/Assistance - 24 hour    Equipment Recommendations  3 in 1 bedside commode    Recommendations for Other Services       Precautions / Restrictions Precautions Precautions: Fall Restrictions Weight Bearing Restrictions: No      Mobility Bed Mobility               General bed mobility comments: sitting in chair without armrests on arrival  Transfers Overall transfer level: Needs assistance   Transfers: Sit to/from Stand Sit to Stand: Min guard         General transfer comment: guarding for safety and equipment to rise, min assist to back fully to chair pt impulsive and rushing without following commands    Balance Overall balance assessment: Needs assistance Sitting-balance support: No upper extremity supported;Feet  supported Sitting balance-Leahy Scale: Fair Sitting balance - Comments: pt able to sit in chair with back support   Standing balance support: Bilateral upper extremity supported;During functional activity Standing balance-Leahy Scale: Poor Standing balance comment: reliant on bil UE support in standing                           ADL either performed or assessed with clinical judgement   ADL Overall ADL's : Needs assistance/impaired     Grooming: Set up;Sitting   Upper Body Bathing: Minimal assistance;Sitting   Lower Body Bathing: Moderate assistance;Sit to/from stand Lower Body Bathing Details (indicate cue type and reason): decreased reach to B feet and buttocks, min guard sit<>Stand  Upper Body Dressing : Sitting;Minimal assistance   Lower Body Dressing: Maximal assistance;Sit to/from stand Lower Body Dressing Details (indicate cue type and reason): decreased reach to B feet and buttocks, min guard <>stand  Toilet Transfer: Min guard;Ambulation;RW Toilet Transfer Details (indicate cue type and reason): simulated to recliner          Functional mobility during ADLs: Min guard;Minimal assistance;Rolling walker;Cueing for safety General ADL Comments: patient limited by safety, body habitus, generalized weakness and decreased activity tolerance     Vision         Perception     Praxis      Pertinent Vitals/Pain Pain Assessment: 0-10 Pain Score: 4  Pain Location: bil knee pain, aching Pain Descriptors / Indicators: Aching;Constant Pain Intervention(s): Limited activity within patient's  tolerance     Hand Dominance     Extremity/Trunk Assessment Upper Extremity Assessment Upper Extremity Assessment: Generalized weakness   Lower Extremity Assessment Lower Extremity Assessment: Defer to PT evaluation RLE Deficits / Details: hip flexion 2-/5, knee extension 3/5, knee flexion 3/5 LLE Deficits / Details: hip flexion 2-/5, knee extension 3/5, knee flexion  3/5   Cervical / Trunk Assessment Cervical / Trunk Assessment: Kyphotic   Communication Communication Communication: Expressive difficulties   Cognition Arousal/Alertness: Awake/alert Behavior During Therapy: Flat affect Overall Cognitive Status: No family/caregiver present to determine baseline cognitive functioning Area of Impairment: Memory;Safety/judgement;Problem solving                     Memory: Decreased short-term memory;Decreased recall of precautions   Safety/Judgement: Decreased awareness of safety;Decreased awareness of deficits   Problem Solving: Slow processing;Requires verbal cues     General Comments  VSS    Exercises Total Joint Exercises Long Arc Quad: AROM;10 reps;Both;Seated Marching in Standing: AROM;Both;5 reps;Seated   Shoulder Instructions      Home Living Family/patient expects to be discharged to:: Private residence Living Arrangements: Alone   Type of Home: House Home Access: Ramped entrance;Stairs to enter Entrance Stairs-Number of Steps: 4   Home Layout: One level     Bathroom Shower/Tub: Chief Strategy Officer: Standard     Home Equipment: Environmental consultant - 4 wheels;Cane - single point;Hospital bed          Prior Functioning/Environment Level of Independence: Independent with assistive device(s);Needs assistance  Gait / Transfers Assistance Needed: walks up 7' with cane or rollator ADL's / Homemaking Assistance Needed: has trouble washing  (sink baths on toilet), cleaning after toileting). pt prepares quick prep food like sandwiches   Comments: ex wife brings meals and cleans on occasion        OT Problem List: Decreased strength;Decreased activity tolerance;Impaired balance (sitting and/or standing);Decreased cognition;Decreased safety awareness;Decreased knowledge of use of DME or AE;Decreased knowledge of precautions;Cardiopulmonary status limiting activity;Obesity;Increased edema      OT  Treatment/Interventions: Self-care/ADL training;Therapeutic exercise;Energy conservation;DME and/or AE instruction;Therapeutic activities;Cognitive remediation/compensation;Patient/family education;Balance training    OT Goals(Current goals can be found in the care plan section) Acute Rehab OT Goals Patient Stated Goal: go to [facility] to be with my sister OT Goal Formulation: With patient Time For Goal Achievement: 07/30/19 Potential to Achieve Goals: Good  OT Frequency: Min 2X/week   Barriers to D/C: Decreased caregiver support          Co-evaluation PT/OT/SLP Co-Evaluation/Treatment: Yes Reason for Co-Treatment: For patient/therapist safety;Necessary to address cognition/behavior during functional activity PT goals addressed during session: Mobility/safety with mobility;Balance;Proper use of DME OT goals addressed during session: ADL's and self-care      AM-PAC OT "6 Clicks" Daily Activity     Outcome Measure Help from another person eating meals?: A Little Help from another person taking care of personal grooming?: A Little Help from another person toileting, which includes using toliet, bedpan, or urinal?: A Lot Help from another person bathing (including washing, rinsing, drying)?: A Lot Help from another person to put on and taking off regular upper body clothing?: A Little Help from another person to put on and taking off regular lower body clothing?: A Lot 6 Click Score: 15   End of Session Equipment Utilized During Treatment: Gait belt;Rolling walker;Oxygen Nurse Communication: Mobility status  Activity Tolerance: Patient limited by fatigue Patient left: in chair;with call bell/phone within reach;with chair alarm set  OT  Visit Diagnosis: Other abnormalities of gait and mobility (R26.89);Muscle weakness (generalized) (M62.81)                Time: [id number] OT Time Calculation (min): 25 min Charges:  OT General Charges $OT Visit: 1 Visit OT Evaluation $OT Eval  Moderate Complexity: 1 Mod  Chancy Milroyhristie S Eean Buss, OT Acute Rehabilitation Services Pager [phone] Office [phone]   Chancy MilroyChristie S Kayelyn Lemon 07/16/2019, 12:52 PM

## 2019-07-16 NOTE — Progress Notes (Signed)
Paged at [place] re HR 38, nonsustained lasted 1 second, then back up to 80s. Pt asymptomatic sitting at bedside.  Lattie Haw, MD PGY-1, [place] [facility]

## 2019-07-16 NOTE — Progress Notes (Signed)
Family Medicine Teaching Service Daily Progress Note Intern Pager: [phone]  Patient name: Charles Mckinney Medical record number: [id number] Date of birth: [birth year] Age: 68 y.o. Gender: male  Primary Care Provider: Tsosie Billing, MD (Inactive) Consultants: Cardiology  Code Status: Full Code   Pt Overview and Major Events to Date:  07/15/2019 - admitted   Assessment and Plan: [name] is a 68 year old male presenting with   Acute on Chronic HFrEF (30-35%)  Patient reports SOB that has not improved Sounds congested on pulmonary exam, rhonchi, 2+ pitting edema on bilateral LEs Down 60 (371mL) output so far, Dry weight noted to be 330pounds, patient has gained 20pounds   -continue home Entresto BID  -strict I/Os -IV Lasix increased from 60 to 120mg  BID, will consider adding Metolazone if no response -Reduce Coreg to 6.25 for PM dose  -will hold Digoxin 0.125, patient sub therapeutic so may not continue   -discontinued Digoxin 0.125  -holding Aldactone  -monitor renal function with AM BMP -daily weights  -Dulera  -daily potassum 12mEq   CAD (s/p CABGx4) -on cadiac monitoring Continue pravastatin 40   A Fib Chronic  Patient had heart rates drop into 30s overnight and into 20s per tele this am around 9:50. Patient was asymptomatic with stable vital signs.  Patient qualifies for increased Eliquis dose but will leave at 2.5mg  as he has been tolerating well and has history of bleeding on Xarelto.  -will plan to continue home Eliquis 2.5mg   -Discontinued Amiodarone 200mg  this AM due to episodes of bradycardia  -continue with cardiac monitoring   CKD, Stage 3, Cr 1.61 on admission, BL appears to be 1.5  -daily AM BMPs while diuresing  On admission   HTN 115/79  Has been normotensive overnight  Will continue to monitor with vitals   HLD  Continue home Pravastatin 40mg    GERD: Patient does not report symptoms of GERD at this time  Left Lower Extremity Venous  Stasis -seen by wound care -unna boot replaced and wound care instructions given to nursing   Gout No active flare, not on allopurinol  -will continue to monitor    FEN/GI: heart healthy PPx: on Eliquis 2.5 BID   Disposition: patient will likely discharge to SNF as recommended by PT/OT  Subjective:  Patient reports continued SOB   Objective: Temp:  [98 F (36.7 C)-98.4 F (36.9 C)] 98.2 F (36.8 C) (08/11 0353) Pulse Rate:  [52-119] 72 (08/11 0959) Resp:  [11-21] 20 (08/11 0353) BP: (105-145)/(62-127) 105/75 (08/11 0951) SpO2:  [95 %-99 %] 97 % (08/11 0353) Weight:  [158.5 kg-158.7 kg] 158.5 kg (08/11 1502)  Physical Exam: General: Alert and cooperative and appears to be in NAD but uncomfortable appearing  HEENT: moist mucous membranes  Cardio: Normal S1 and S2, no S3 or S4. Rhythm is regular. No murmurs or rubs.   Pulm:Rhochus breath sounds, with congestions, increased respiratory effort, on 2 L via [place]  Abdomen: Bowel sounds normal. Abdomen soft and non-tender.  Extremities: 2+ pitting edema on bilateral lower extremities, Left LE in unna boot.  Laboratory: Recent Labs  Lab 07/15/19 1512  WBC 7.5  HGB 12.5*  HCT 39.9  PLT 185   Recent Labs  Lab 07/15/19 1512 07/16/19 0337  NA 142 143  K 3.9 3.9  CL 106 103  CO2 26 30  BUN 20 19  CREATININE 1.49* 1.61*  CALCIUM 8.7* 8.5*  PROT 6.0*  --   BILITOT 0.8  --   [facility]  39  --   ALT 26  --   AST 26  --   GLUCOSE 98 116*    Imaging/Diagnostic Tests: CXR:  IMPRESSION: Stable bibasilar subsegmental atelectasis or scarring is noted with probable small pleural effusions.  Nicki Guadalajara, MD 07/16/2019, 3:27 PM PGY-1, [facility] Family Medicine FPTS Intern pager: [phone], text pages welcome

## 2019-07-16 NOTE — Progress Notes (Signed)
Amiodorone, coreg and digoxin discontinued for bradycardia   Wilber Oliphant, M.D.  PGY-2  Family Medicine  [phone] 07/16/2019 1:54 PM

## 2019-07-16 NOTE — Progress Notes (Signed)
Unable to obtain standing weight. Pt. To weak to stand. Pt. Requesting to sit on side of bed at this time.

## 2019-07-16 NOTE — Progress Notes (Signed)
Patient HR drops down to the 20['s per central tele, patient asymptomatic, family med resident made aware, give RN verbal order to hold amiodarone.

## 2019-07-16 NOTE — Progress Notes (Signed)
Orthopedic Tech Progress Note Patient Details:  Charles Mckinney 10-19-1951 [id number] Applied unna boot with RN at bedside. Ortho Devices Type of Ortho Device: Haematologist Ortho Device/Splint Location: LLE Ortho Device/Splint Interventions: Adjustment, Ordered, Application   Post Interventions Patient Tolerated: Well Instructions Provided: Care of device, Adjustment of device   Janit Pagan 07/16/2019, 4:06 PM

## 2019-07-16 NOTE — Progress Notes (Signed)
Patient refused CPAP for the night  

## 2019-07-16 NOTE — Progress Notes (Signed)
Pt. HR down to 30s non-sustained. Sustaining in the 80s. Pt. Asymptomatic. VSS. On call for FMTS paged to make aware.

## 2019-07-16 NOTE — Progress Notes (Signed)
RT came to patient room to give AM [facility].  Patient stated he wanted to take inhaler but he wanted to do it later this AM.  Left inhaler in room.  Will check back a little later this AM.

## 2019-07-16 NOTE — Consult Note (Signed)
[place] Nurse wound consult note Patient receiving care in [place].  Patient arrived with an unna boot to the left lower extremity, which was removed to reveal a weeping wound with a yellow color, per the telephone conversation I had with RNs Shirlee Limerick and Mikle Bosworth.  The consult was completed remotely after review of record. Reason for Consult: LLEwound Wound type: venous stasis Measurement: To be provided by the bedside RN in the flowsheet section Wound bed: yellow Drainage (amount, consistency, odor) serous, no odor Periwound: To be provided by the bedside RN in the flowsheet section Dressing procedure/placement/frequency: Wash leg with soap and water. Pat dry. Apply Sween moisturizing ointment to intact skin. Cover wound with Xeroform and 4 x 4 foam pad.  Then call Ortho Tech for [employer] placement. Monitor the wound area(s) for worsening of condition such as: Signs/symptoms of infection,  Increase in size,  Development of or worsening of odor, Development of pain, or increased pain at the affected locations.  Notify the medical team if any of these develop.  Thank you for the consult.  Discussed plan of care with the bedside nurses.  [place] nurse will not follow at this time.  Please re-consult the [place] team if needed.  Val Riles, RN, MSN, CWOCN, CNS-BC, pager [phone]

## 2019-07-17 DIAGNOSIS — E032 Hypothyroidism due to medicaments and other exogenous substances: Secondary | ICD-10-CM

## 2019-07-17 DIAGNOSIS — I5043 Acute on chronic combined systolic (congestive) and diastolic (congestive) heart failure: Secondary | ICD-10-CM

## 2019-07-17 DIAGNOSIS — I255 Ischemic cardiomyopathy: Secondary | ICD-10-CM

## 2019-07-17 DIAGNOSIS — I25119 Atherosclerotic heart disease of native coronary artery with unspecified angina pectoris: Secondary | ICD-10-CM

## 2019-07-17 DIAGNOSIS — N183 Chronic kidney disease, stage 3 (moderate): Secondary | ICD-10-CM

## 2019-07-17 LAB — BASIC METABOLIC PANEL
Anion gap: 11 (ref 5–15)
BUN: 20 mg/dL (ref 8–23)
CO2: 33 mmol/L — ABNORMAL HIGH (ref 22–32)
Calcium: 8.5 mg/dL — ABNORMAL LOW (ref 8.9–10.3)
Chloride: 96 mmol/L — ABNORMAL LOW (ref 98–111)
Creatinine, Ser: 1.71 mg/dL — ABNORMAL HIGH (ref 0.61–1.24)
GFR calc Af Amer: 47 mL/min — ABNORMAL LOW (ref >60)
GFR calc non Af Amer: 40 mL/min — ABNORMAL LOW (ref >60)
Glucose, Bld: 94 mg/dL (ref 70–99)
Potassium: 3.8 mmol/L (ref 3.5–5.1)
Sodium: 140 mmol/L (ref 135–145)

## 2019-07-17 LAB — TSH: TSH: 79.146 u[IU]/mL — ABNORMAL HIGH (ref 0.350–4.500)

## 2019-07-17 LAB — MAGNESIUM: Magnesium: 2.2 mg/dL (ref 1.7–2.4)

## 2019-07-17 MED ORDER — APIXABAN 5 MG PO TABS
5.0000 mg | ORAL_TABLET | Freq: Two times a day (BID) | ORAL | Status: DC
  Administered 2019-07-17 – 2019-07-25 (×16): 5 mg via ORAL
  Filled 2019-07-17 (×16): qty 1

## 2019-07-17 MED ORDER — FUROSEMIDE 10 MG/ML IJ SOLN
80.0000 mg | Freq: Two times a day (BID) | INTRAMUSCULAR | Status: DC
  Administered 2019-07-17 – 2019-07-18 (×2): 80 mg via INTRAVENOUS
  Filled 2019-07-17 (×3): qty 8

## 2019-07-17 MED ORDER — FUROSEMIDE 10 MG/ML IJ SOLN: 80.0000 mg | Freq: Every day | INTRAMUSCULAR | Status: DC

## 2019-07-17 MED ORDER — LEVOTHYROXINE SODIUM 50 MCG PO TABS: 50.0000 ug | ORAL_TABLET | Freq: Every day | ORAL | Status: DC

## 2019-07-17 MED ORDER — FUROSEMIDE 10 MG/ML IJ SOLN: 120.0000 mg | Freq: Every day | INTRAVENOUS | Status: DC

## 2019-07-17 MED ORDER — LEVOTHYROXINE SODIUM 50 MCG PO TABS
50.0000 ug | ORAL_TABLET | Freq: Every day | ORAL | Status: DC
  Administered 2019-07-18 – 2019-07-25 (×8): 50 ug via ORAL
  Filled 2019-07-17 (×8): qty 1

## 2019-07-17 NOTE — Plan of Care (Signed)
  Problem: Clinical Measurements: Goal: Will remain free from infection Outcome: Progressing   Problem: Activity: Goal: Risk for activity intolerance will decrease Outcome: Progressing   Problem: Elimination: Goal: Will not experience complications related to bowel motility Outcome: Progressing   Problem: Safety: Goal: Ability to remain free from injury will improve Outcome: Progressing   

## 2019-07-17 NOTE — Progress Notes (Addendum)
Progress Note  Patient Name: Charles Mckinney Date of Encounter: 07/17/2019  Primary Cardiologist: Bryan Lemma, MD   Subjective   Sitting up in the chair. Reports his breathing seems to be somewhat better.   Inpatient Medications    Scheduled Meds: . apixaban  2.5 mg Oral BID  . carvedilol  6.25 mg Oral BID WC  . mometasone-formoterol  2 puff Inhalation BID  . potassium chloride SA  20 mEq Oral Daily  . pravastatin  40 mg Oral Daily  . sacubitril-valsartan  1 tablet Oral BID  . sodium chloride flush  3 mL Intravenous Q12H   Continuous Infusions: . sodium chloride    . furosemide 120 mg (07/17/19 0847)   PRN Meds: sodium chloride, acetaminophen, sodium chloride flush   Vital Signs    Vitals:   07/17/19 0029 07/17/19 0426 07/17/19 0756 07/17/19 0826  BP: 108/80 111/70 108/64   Pulse: 68 74 69 71  Resp: 18 18 15 18   Temp: 98.2 F (36.8 C) 97.8 F (36.6 C) (!) 97.5 F (36.4 C)   TempSrc: Oral Oral Oral   SpO2: 95% 95% 99% 98%  Weight:  (!) 156.7 kg    Height:        Intake/Output Summary (Last 24 hours) at 07/17/2019 0932 Last data filed at 07/17/2019 0755 Gross per 24 hour  Intake 1080.34 ml  Output 4450 ml  Net -3369.66 ml   Last 3 Weights 07/17/2019 07/16/2019 07/16/2019  Weight (lbs) 345 lb 8 oz 349 lb 6.4 oz (No Data)  Weight (kg) 156.718 kg 158.487 kg (No Data)      Telemetry    Afib rate mostly round 50, but some episodes of tachycardia- Personally Reviewed  ECG    No new tracing - Personally Reviewed  Physical Exam  Morbidly obese WM, sitting up in the chair.  GEN: No acute distress.   Neck: Difficult to assess JVD due to girth Cardiac: Irreg Irreg, no murmurs, rubs, or gallops.  Respiratory: Rhonchi bilaterally. GI: Soft, obese Ext: 3+ LE edema bilaterally; No deformity. Neuro:  Nonfocal  Psych: Normal affect   Labs    High Sensitivity Troponin:  No results for input(s): TROPONINIHS in the last 720 hours.    Cardiac EnzymesNo  results for input(s): TROPONINI in the last 168 hours. No results for input(s): TROPIPOC in the last 168 hours.   Chemistry Recent Labs  Lab 07/15/19 1512 07/16/19 0337 07/17/19 0353  NA 142 143 140  K 3.9 3.9 3.8  CL 106 103 96*  CO2 26 30 33*  GLUCOSE 98 116* 94  BUN 20 19 20   CREATININE 1.49* 1.61* 1.71*  CALCIUM 8.7* 8.5* 8.5*  PROT 6.0*  --   --   ALBUMIN 3.4*  --   --   AST 26  --   --   ALT 26  --   --   ALKPHOS 39  --   --   BILITOT 0.8  --   --   GFRNONAA 48* 43* 40*  GFRAA 55* 50* 47*  ANIONGAP 10 10 11      Hematology Recent Labs  Lab 07/15/19 1512  WBC 7.5  RBC 3.82*  HGB 12.5*  HCT 39.9  MCV 104.5*  MCH 32.7  MCHC 31.3  RDW 14.3  PLT 185    BNP Recent Labs  Lab 07/15/19 1512  BNP 298.0*     DDimer No results for input(s): DDIMER in the last 168 hours.   Radiology  Dg Chest Port 1 View  Result Date: 07/15/2019 CLINICAL DATA:  Shortness of breath. EXAM: PORTABLE CHEST 1 VIEW COMPARISON:  Radiographs of May 03, 2019. FINDINGS: Stable cardiomegaly. Sternotomy wires are noted. No pneumothorax is noted. Stable bibasilar atelectasis or scarring is noted with probable small pleural effusions. Bony thorax is unremarkable. IMPRESSION: Stable bibasilar subsegmental atelectasis or scarring is noted with probable small pleural effusions. Electronically Signed   By: Marijo Conception M.D.   On: 07/15/2019 15:20    Cardiac Studies   N/a  Patient Profile     68 y.o. 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  Assessment & Plan    1. Acute on Chronic combined HF: remains on IV lasix 120mg  BID. Weight down 349>>345lbs overnight. Net - 3.1L. Reports some improvement in his breathing. Still with significant overload on exam. Continue diuresis with  monitoring of lytes and Cr.   2. Permanent Afib: Had been on dig and amiodarone. Dig was stopped prior to admission. Amiodarone stopped yesterday as he has been in afib for quite some time. Also TSH is now 81. Heart rate somewhat variable on telemetry with episodes of tachycardia. BB was held this morning by primary. Will continue to follow response.  -- on Eliquis (reduced dose). Clear why he is on reduced dosing, notes indicated 2/2 to renal function? Will increase to 5mg  BID.   3. CKD: Cr increased from 1.61>>1.71 today. Will follow BMET with diuresis.   4. Hypothyroidism: TSH noted at 81. Amiodarone has been stopped. Further management per primary.   For questions or updates, please contact [place] Please consult [email or web address] for contact info under        Signed, Reino Bellis, NP  07/17/2019, 9:32 AM

## 2019-07-17 NOTE — Progress Notes (Signed)
Pt. BP 97/67. On call for FMTS paged to make aware. Hold Entresto for [employer].

## 2019-07-17 NOTE — Progress Notes (Signed)
condon cath removed, patient given urinal.

## 2019-07-17 NOTE — Progress Notes (Signed)
Patient refused CPAP tonight 

## 2019-07-17 NOTE — Progress Notes (Signed)
Family Medicine Teaching Service Daily Progress Note Intern Pager: [phone]  Patient name: Charles Mckinney Medical record number: [id number] Date of birth: 1951-05-17 Age: 68 y.o. Gender: male  Primary Care Provider: Josiah Lobo, MD (Inactive) Consultants: cardiology, Wound Care Code Status: full code   Assessment and Plan:  Charles Mckinney is a 68 year old male who presented with acute on chronic heart failure with reduced ejection fraction exacerbation.   Acute on Chronic HFrEF 30-35% exacerbation  Patient reports that his SOB has improved on 2 Liters nasal canula. On exam, he has less rhonchi and transmitted upper airway sounds. Patient continues to have 2+ pitting bilateral lower extremity edema with unna boots. Urine output >3 liters with weight loss of 2kg on 120mg  of IV Lasix. Cr. Elevated at 1.7 from 1.5 on admission, will decrease lasix dose and continue to diurese.  -decrease lasix to 80mg  BID  -continue with daily weights and strict I/Os -hold Coreg 6.25 (bradycardia), patient will continue with outpatient -hold Aldactone  -continue to monitor renal function with daily BMP  -continue Dulera   Elevated TSH Patient with a TSH of 81, 79 on repeat.  This is likely due to patient's chronic use of amiodarone that has since been discontinued.  Will treat patient for hypothyroidism. -Start 50 mcg levothyroxine nightly   Atrial fibrillation Patient has discontinued digoxin and amiodarone.  Cardiology has increased Eliquis from 2.5 mg twice daily to 5 mg twice daily. -Continue Eliquis 5 mg twice daily  Hypotension/bradycardia Patient has been hypotensive overnight with blood pressure as low as 94/69. Patient reports feeling dizziness during episodes of hypotension. No episodes of bradycardia overnight.  -We will continue to hold Entresto and Coreg  CKD, Stage 3, Cr 1.61 on admission, now 1.7, BL appears to be 1.5  -daily AM BMPs while diuresing    HTN 115/79  Has been  normotensive overnight  Will continue to monitor with vitals   HLD  Continue home Pravastatin 40mg    GERD: Patient does not report symptoms of GERD at this time  Left Lower Extremity Venous Stasis -seen by wound care -We will maintain Unna bootS   Gout No active flare, not on allopurinol  -will continue to monitor    FEN/GI: heart healthy PPx: on Eliquis 5 mg BID    Disposition: plan to discharge to SNF per recommendations from PT/OT  Subjective:  [place] reports improved SOB with decreased   Objective: Temp:  [97.2 F (36.2 C)-98.2 F (36.8 C)] 97.8 F (36.6 C) (08/12 0426) Pulse Rate:  [61-74] 74 (08/12 0426) Resp:  [18-20] 18 (08/12 0426) BP: (94-111)/(56-80) 111/70 (08/12 0426) SpO2:  [91 %-97 %] 95 % (08/12 0426) Weight:  [156.7 kg-158.5 kg] 156.7 kg (08/12 0426)  Physical Exam: General: Patient sitting in recliner in no acute distress with nasal cannula in place Cardiovascular: Irregularly irregular no no friction rub or gallop, bilateral 2+ lower extremity edema, bilateral radial pulses palpable Respiratory: Decreased lung sounds at bases, improved rhonchi Abdomen: Obese abdomen, positive bowel sounds throughout, no tenderness to palpation Extremities: 2+ pitting edema in bilateral lower extremities  Laboratory: Recent Labs  Lab 07/15/19 1512  WBC 7.5  HGB 12.5*  HCT 39.9  PLT 185   Recent Labs  Lab 07/15/19 1512 07/16/19 0337 07/17/19 0353  NA 142 143 140  K 3.9 3.9 3.8  CL 106 103 96*  CO2 26 30 33*  BUN 20 19 20   CREATININE 1.49* 1.61* 1.71*  CALCIUM 8.7* 8.5* 8.5*  PROT  6.0*  --   --   BILITOT 0.8  --   --   ALKPHOS 39  --   --   ALT 26  --   --   AST 26  --   --   GLUCOSE 98 116* 94    Imaging/Diagnostic Tests: No new imaging  Charles Klein, MD 07/17/2019, 6:28 AM PGY-1, [place] Intern pager: [phone], text pages welcome

## 2019-07-17 NOTE — Plan of Care (Signed)
°  Problem: Education: °Goal: Knowledge of General Education information will improve °Description: Including pain rating scale, medication(s)/side effects and non-pharmacologic comfort measures °Outcome: Progressing °  °Problem: Clinical Measurements: °Goal: Will remain free from infection °Outcome: Progressing °  °Problem: Activity: °Goal: Risk for activity intolerance will decrease °Outcome: Progressing °  °Problem: Elimination: °Goal: Will not experience complications related to bowel motility °Outcome: Progressing °  °

## 2019-07-17 NOTE — Discharge Summary (Signed)
Family Medicine Teaching [facility] Discharge Summary  Patient name: Charles Mckinney Medical record number: [id number] Date of birth: Sep 23, 1951 Age: 68 y.o. Gender: male Date of Admission: 07/15/2019  Date of Discharge: 07/25/2019 Admitting Physician: Nestor Ramp, MD  Primary Care Provider: Josiah Lobo, MD (Inactive) Consultants: Audiology, wound care  Indication for Hospitalization: Acute on chronic heart failure exacerbation  Discharge Diagnoses/Problem List:  Principal Problem:   Acute on chronic combined systolic and diastolic CHF (congestive heart failure) (HCC) Active Problems:   Morbid obesity (HCC)   Persistent atrial fibrillation (HCC): CHA2DS2Vasc 6   CKD (chronic kidney disease), stage III (HCC)   Essential hypertension   Ischemic cardiomyopathy   Coronary artery disease involving native coronary artery of native heart with angina pectoris (HCC)   AAA (abdominal aortic aneurysm) without rupture (HCC)   Hypothyroidism due to medication  Disposition: Discharge to [facility] facility  Discharge Condition: Stable  Discharge Exam:  General: Alert and cooperative and appears to be in no acute distress HEENT: Neck non-tender without lymphadenopathy, masses or thyromegaly Cardio: Normal S1 and S2, no S3 or S4. Rhythm is regular. No murmurs or rubs.   Pulm: Clear to auscultation bilaterally, no crackles, wheezing, or diminished breath sounds. Normal respiratory effort Abdomen: Bowel sounds normal. Abdomen soft and non-tender.  Extremities: Lower extremity pitting edema. Warm/ well perfused.  Neuro: Cranial nerves grossly intact   Brief Hospital Course:  [name] was admitted with increased shortness of breath and fluid overload evidenced by bilateral lower extremity edema.  Patient was treated with IV Lasix and diuresed with a goal of returning to patient's estimated dry weight of 330 pounds today he is 307 pounds. On admission patient weighed 354  pounds. Cardiology recommended discontinuation of Amiodarone during this admission, of note digoxin was discontinued prior to admission.  Patient was transitioned to metoprolol from coreg given low blood pressures. He will need metoprolol 12.5 mg twice daily, yesterday metoprolol was held due to hypotension. Eliquis was increased to 5 mg twice daily from 2.5 mg twice daily to be more therapeutic. Patient diuresed 10.5 liters since admission and his volume status improved.   Patient was evaluated by physical therapy and Occupational Therapy as he reported having trouble taking care of himself at home and being able to perform ADLs successfully.  Patient was also evaluated by social work. Patient considered to be a candidate for SNF upon discharge.  Patient discharged to SNF.   Atrial fibrillation: Was controlled during this admission patient continued his Eliquis that was increased to 5 mg throughout the admission.  Hypotension/bradycardia: Patient exhibited intermittent episodes of bradycardia as well as hypotension throughout admission.  She was given 1 fluid bolus.  Entresto, Lasix, and Coreg were held immediately throughout admission in order to prevent worsening hypotension/bradycardia.  Metoprolol held last day of admission. Patient is expected to resume these medications upon discharge.  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  TSH should be repeated 2 to 4 weeks from now.  Cough: Patient reported productive cough during admission, patient was afebrile.  Patient was prescribed Mucinex.  Patient continued to have intermittent coughing and chest x-ray was performed showing bilateral pleural effusions left greater than right with underlying atelectasis.  These these findings were consistent with findings on admission chest x-ray.  Issues for Follow Up:  1. Volume status, patient sent home on lasix 60mg  BID.  2. Entresto unable to be started during stay  due to low  blood pressures. Patient will need close follow up with cardiology.  3. Hypothyroidism 2/2 amiodarone therapy, Synthroid 50 mg nightly started and amiodarone discontinued. Will need recheck of TSH in 2-4 weeks.    Significant Procedures: none  Significant Labs and Imaging:  No results for input(s): WBC, HGB, HCT, PLT in the last 168 hours. Recent Labs  Lab 07/15/19 1512 07/16/19 0337 07/17/19 0353 07/17/19 0917 07/18/19 0538 07/19/19 0543 07/20/19 0437  NA 142 143 140  --  139 138 140  K 3.9 3.9 3.8  --  3.6 3.7 3.4*  CL 106 103 96*  --  95* 95* 95*  CO2 26 30 33*  --  33* 31 34*  GLUCOSE 98 116* 94  --  96 92 98  BUN 20 19 20   --  20 21 21   CREATININE 1.49* 1.61* 1.71*  --  1.58* 1.58* 1.54*  CALCIUM 8.7* 8.5* 8.5*  --  8.4* 8.5* 8.6*  MG  --   --   --  2.2  --   --   --   ALKPHOS 39  --   --   --   --   --   --   AST 26  --   --   --   --   --   --   ALT 26  --   --   --   --   --   --   ALBUMIN 3.4*  --   --   --   --   --   --     Results/Tests Pending at Time of Discharge: None.  Discharge Medications:  Allergies as of 07/25/2019      Reactions   Niacin And Related Other (See Comments)   FLUSHING   Xarelto [rivaroxaban] Other (See Comments)   Cause bloody stools. Switched to eliquis low dose due to bleeding.    Penicillins Itching, Rash, Other (See Comments)   Has patient had a PCN reaction causing immediate rash, facial/tongue/throat swelling, SOB or lightheadedness with hypotension: Yes Has patient had a PCN reaction causing severe rash involving mucus membranes or skin necrosis: No Has patient had a PCN reaction that required hospitalization: No Has patient had a PCN reaction occurring within the last 10 years: No If all of the above answers are "NO", then may proceed with Cephalosporin use.      Medication List    STOP taking these medications   amiodarone 200 MG tablet Commonly known as: PACERONE   carvedilol 12.5 MG tablet Commonly known as:  COREG   Digox 0.125 MG tablet Generic drug: digoxin   Entresto 49-51 MG Generic drug: sacubitril-valsartan     TAKE these medications   albuterol 108 (90 Base) MCG/ACT inhaler Commonly known as: ProAir HFA INHALE 1 PUFF INTO THE LUNGS EVERY 6 HOURS AS NEEDED FOR WHEEZING OR SHORTNESS OF BREATH What changed: Another medication with the same name was changed. Make sure you understand how and when to take each.   albuterol (2.5 MG/3ML) 0.083% nebulizer solution Commonly known as: PROVENTIL USE ONE VIAL USING NEBULIZER EVERY 6 (SIX) HOURS AS NEEDED FOR WHEEZING OR SHORTNESS OF BREATH. What changed: See the new instructions.   allopurinol 100 MG tablet Commonly known as: ZYLOPRIM TAKE 2 TABLETS(200 MG) BY MOUTH DAILY What changed: See the new instructions.   apixaban 5 MG Tabs tablet Commonly known as: ELIQUIS Take 1 tablet (5 mg total) by mouth 2 (two) times daily. What changed:   medication strength  how much to take   fluticasone 50 MCG/ACT nasal spray Commonly known as: FLONASE Place 1 spray into both nostrils daily.   Fluticasone-Salmeterol 250-50 MCG/DOSE Aepb Commonly known as: Advair Diskus Inhale 1 puff into the lungs 2 (two) times daily. What changed: when to take this   furosemide 20 MG tablet Commonly known as: LASIX Take 3 tablets (60 mg total) by mouth 2 (two) times daily. What changed:   when to take this  additional instructions   levothyroxine 50 MCG tablet Commonly known as: SYNTHROID Take 1 tablet (50 mcg total) by mouth daily at 6 (six) AM. Start taking on: July 26, 2019   metoprolol tartrate 25 MG tablet Commonly known as: LOPRESSOR Take 0.5 tablets (12.5 mg total) by mouth 2 (two) times daily.   nitroGLYCERIN 0.4 MG SL tablet Commonly known as: NITROSTAT Place 0.4 mg under the tongue every 5 (five) minutes as needed for chest pain.   potassium chloride SA 20 MEQ tablet Commonly known as: K-DUR Take 1 tablet (20 mEq total) by mouth  daily. What changed: when to take this   pravastatin 40 MG tablet Commonly known as: PRAVACHOL TAKE ONE TABLET BY MOUTH ONCE DAILY            Durable Medical Equipment  (From admission, onward)         Start     Ordered   07/16/19 1302  For home use only DME 3 n 1  Once     07/16/19 1302   07/16/19 1302  For home use only DME wheelchair cushion (seat and back)  Once     07/16/19 1302   07/16/19 1302  For home use only DME high strength lightweight manual wheelchair with seat cushion  Once    Comments: Patient suffers from CHF and SOB which impairs their ability to perform daily activities like bathing and dressing in the home.  A cane will not resolve  issue with performing activities of daily living. A wheelchair will allow patient to safely perform daily activities.Length of need Lifetime. (THEN ONE OF THESE TWO:) Patient self-propels the wheelchair while engaging in frequent activities such as toileting which cannot be performed in a standard or lightweight wheelchair due to the weight of the chair. Accessories: elevating leg rests (ELRs), wheel locks, extensions and anti-tippers.   07/16/19 1302          Discharge Instructions: Please refer to Patient Instructions section of EMR for full details.  Patient was counseled important signs and symptoms that should prompt return to medical care, changes in medications, dietary instructions, activity restrictions, and follow up appointments.   Follow-Up Appointments: Follow-up with your PCP and cardiologist.   Gerlene Fee, DO 07/25/2019, 8:26 AM PGY-1, [place]

## 2019-07-18 LAB — BASIC METABOLIC PANEL
Anion gap: 11 (ref 5–15)
BUN: 20 mg/dL (ref 8–23)
CO2: 33 mmol/L — ABNORMAL HIGH (ref 22–32)
Calcium: 8.4 mg/dL — ABNORMAL LOW (ref 8.9–10.3)
Chloride: 95 mmol/L — ABNORMAL LOW (ref 98–111)
Creatinine, Ser: 1.58 mg/dL — ABNORMAL HIGH (ref 0.61–1.24)
GFR calc Af Amer: 51 mL/min — ABNORMAL LOW (ref >60)
GFR calc non Af Amer: 44 mL/min — ABNORMAL LOW (ref >60)
Glucose, Bld: 96 mg/dL (ref 70–99)
Potassium: 3.6 mmol/L (ref 3.5–5.1)
Sodium: 139 mmol/L (ref 135–145)

## 2019-07-18 MED ORDER — SACUBITRIL-VALSARTAN 24-26 MG PO TABS: 1.0000 | ORAL_TABLET | Freq: Two times a day (BID) | ORAL | Status: DC

## 2019-07-18 MED ORDER — FLUTICASONE PROPIONATE 50 MCG/ACT NA SUSP
1.0000 | Freq: Every day | NASAL | Status: DC
  Administered 2019-07-18 – 2019-07-25 (×8): 1 via NASAL
  Filled 2019-07-18: qty 16

## 2019-07-18 MED ORDER — GUAIFENESIN ER 600 MG PO TB12
600.0000 mg | ORAL_TABLET | Freq: Two times a day (BID) | ORAL | Status: DC
  Administered 2019-07-18 – 2019-07-25 (×14): 600 mg via ORAL
  Filled 2019-07-18 (×15): qty 1

## 2019-07-18 MED ORDER — SODIUM CHLORIDE 0.9 % IV BOLUS
250.0000 mL | Freq: Once | INTRAVENOUS | Status: AC
  Administered 2019-07-18: 250 mL via INTRAVENOUS

## 2019-07-18 MED ORDER — SACUBITRIL-VALSARTAN 24-26 MG PO TABS
1.0000 | ORAL_TABLET | Freq: Two times a day (BID) | ORAL | Status: DC
  Administered 2019-07-20 – 2019-07-23 (×5): 1 via ORAL
  Filled 2019-07-18 (×8): qty 1

## 2019-07-18 MED ORDER — CARVEDILOL 3.125 MG PO TABS
3.1250 mg | ORAL_TABLET | Freq: Two times a day (BID) | ORAL | Status: DC
  Administered 2019-07-19 – 2019-07-21 (×4): 3.125 mg via ORAL
  Filled 2019-07-18 (×5): qty 1

## 2019-07-18 MED ORDER — FUROSEMIDE 10 MG/ML IJ SOLN
100.0000 mg | Freq: Two times a day (BID) | INTRAVENOUS | Status: DC
  Administered 2019-07-19 – 2019-07-20 (×4): 100 mg via INTRAVENOUS
  Filled 2019-07-18 (×5): qty 10

## 2019-07-18 NOTE — Progress Notes (Addendum)
Patient having some dizziness while ambulating in room.   Per patient did not have his O2 on.  Told patient not to get up without RN or NT in room.   Check vital signs  BP was low, O2 was 88%.   Placed patient back on 4 L O2 - sats went up 94.    Paged MD   Spoke to cardiology- about patient BP. Will give 250 bolus of NS and decrease entresto dose.   Will notify primary MD

## 2019-07-18 NOTE — Progress Notes (Signed)
Progress Note  Patient Name: Charles Mckinney Date of Encounter: 07/18/2019  Primary Cardiologist: Glenetta Hew, MD   Subjective   Sitting up in the chair. Reports breathing is better. Wants to work with PT  Inpatient Medications    Scheduled Meds: . apixaban  5 mg Oral BID  . carvedilol  6.25 mg Oral BID WC  . furosemide  80 mg Intravenous BID  . levothyroxine  50 mcg Oral Q0600  . mometasone-formoterol  2 puff Inhalation BID  . potassium chloride SA  20 mEq Oral Daily  . pravastatin  40 mg Oral Daily  . sacubitril-valsartan  1 tablet Oral BID  . sodium chloride flush  3 mL Intravenous Q12H   Continuous Infusions: . sodium chloride     PRN Meds: sodium chloride, acetaminophen, sodium chloride flush   Vital Signs    Vitals:   07/17/19 1641 07/17/19 1919 07/17/19 1932 07/18/19 0436  BP: 95/76  97/67 100/63  Pulse: 62  66   Resp: 20  18 18   Temp: 98.4 F (36.9 C)  98.6 F (37 C) (!) 97.5 F (36.4 C)  TempSrc: Oral  Oral Oral  SpO2: 96% 92% 100%   Weight:    (!) 154.9 kg  Height:        Intake/Output Summary (Last 24 hours) at 07/18/2019 0809 Last data filed at 07/18/2019 0440 Gross per 24 hour  Intake 1680 ml  Output 2825 ml  Net -1145 ml   Last 3 Weights 07/18/2019 07/17/2019 07/16/2019  Weight (lbs) 341 lb 9.6 oz 345 lb 8 oz 349 lb 6.4 oz  Weight (kg) 154.949 kg 156.718 kg 158.487 kg      Telemetry    Afib rate well controlled- Personally Reviewed  ECG    No new tracing - Personally Reviewed  Physical Exam  Morbidly obese WM, sitting up in the chair.  GEN: No acute distress.   Neck: Difficult to assess JVD due to girth Cardiac: Irreg Irreg, no murmurs, rubs, or gallops.  Respiratory: clear GI: Soft, obese Ext: 3+ LE edema bilaterally clearly less tense than initial; No deformity. Neuro:  Nonfocal  Psych: Normal affect   Labs    High Sensitivity Troponin:  No results for input(s): TROPONINIHS in the last 720 hours.    Cardiac EnzymesNo  results for input(s): TROPONINI in the last 168 hours. No results for input(s): TROPIPOC in the last 168 hours.   Chemistry Recent Labs  Lab 07/15/19 1512 07/16/19 0337 07/17/19 0353 07/18/19 0538  NA 142 143 140 139  K 3.9 3.9 3.8 3.6  CL 106 103 96* 95*  CO2 26 30 33* 33*  GLUCOSE 98 116* 94 96  BUN 20 19 20 20   CREATININE 1.49* 1.61* 1.71* 1.58*  CALCIUM 8.7* 8.5* 8.5* 8.4*  PROT 6.0*  --   --   --   ALBUMIN 3.4*  --   --   --   AST 26  --   --   --   ALT 26  --   --   --   ALKPHOS 39  --   --   --   BILITOT 0.8  --   --   --   GFRNONAA 48* 43* 40* 44*  GFRAA 55* 50* 47* 51*  ANIONGAP 10 10 11 11      Hematology Recent Labs  Lab 07/15/19 1512  WBC 7.5  RBC 3.82*  HGB 12.5*  HCT 39.9  MCV 104.5*  MCH 32.7  MCHC 31.3  RDW 14.3  PLT 185    BNP Recent Labs  Lab 07/15/19 1512  BNP 298.0*     DDimer No results for input(s): DDIMER in the last 168 hours.   Radiology    No results found.  Cardiac Studies   N/a  Patient Profile     68 y.o. 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  Assessment & Plan    1. Acute on Chronic combined HF: remains on IV lasix 80 mg BID- dose reduced yesterday with continued good diuresis. Weight down 350>>341 bs. Net - 4.3 L. Reports some improvement in his breathing. Still with significant overload on exam. Continue diuresis with monitoring of lytes and Cr.   2. Permanent Afib: Had been on dig and amiodarone. Dig was stopped prior to admission. Amiodarone stopped on admission as he has been in afib chronically. Also TSH is now 81 c/w hypothyroidism. Heart rate controlled.  BB dose reduced yesterday due to bradycardia. Will continue to follow response.  -- on Eliquis was on lower dose due to history of bleeding on Xarelto.  Switch to Eliquis appropriate but needs to be on 5 mg bid dose.  3. CKD: Cr increased from 1.61>>1.71. now down to 1.58.  Will follow BMET with diuresis.   4. Hypothyroidism: TSH noted at 81. Amiodarone has been stopped. Started on synthroid  per primary.   For questions or updates, please contact [facility] Please consult [email or web address] for contact info under        Signed, Peter [place], MD  07/18/2019, 8:09 AM

## 2019-07-18 NOTE — Progress Notes (Signed)
Family Medicine Teaching Service Daily Progress Note Intern Pager: [phone]  Patient name: Charles Mckinney Medical record number: [id number] Date of birth: [birth year] Age: 68 y.o. Gender: male  Primary Care Provider: Tsosie Billing, MD (Inactive) Consultants: Cardiology Code Status: Full  Assessment and Plan: Acute on Chronic HFrEF 30-35% exacerbation  Patient reports that his SOB has had some improvement, now on 3 Liters nasal canula.  On exam, patient continues to have rhonchi bilaterally, with decreased basilar lung sounds. Patient continues to have 2+ pitting bilateral lower extremity edema.    Urine output 2.9 liters with weight loss of 5.1kg since admission, now on 80mg  of IV Lasix. Cr. Now 1.58 from 1.7  -continue lasix to 80mg  BID  -continue with daily weights and strict I/Os -continuing Coreg 6.25mg  BID (bradycardia) -hold Aldactone  -continue to monitor renal function with daily BMP  -continue Dulera    Elevated TSH Patient with a TSH of 81, 79 on repeat.  This is likely due to patient's chronic use of amiodarone that has since been discontinued.  Will treat patient for hypothyroidism. -Continue 50 mcg levothyroxine nightly   Atrial fibrillation, chronic Patient continues to be in atrial fibrillation. Patient has discontinued digoxin and amiodarone.  Cardiology has increased Eliquis from 2.5 mg twice daily to 5 mg twice daily. -Continue Eliquis 5 mg twice daily    CKD, Stage 3, Cr 1.61 on admission, now 1.58, BL appears to be 1.5  -daily AM BMPs while patient undergoing diuresis   HTN ranges 93-108 sys/53-64 dias BP have been soft  -continue to hold Entresto  Will continue to monitor with vitals    HLD  Continue home Pravastatin 40mg     GERD: Patient does not report symptoms of GERD at this time   Left Lower Extremity Venous Stasis -We will maintain [place] boots     Gout No active flare, not on allopurinol  -will continue to monitor    FEN/GI: heart  healthy PPx: on Eliquis 5 mg BID      Disposition: plan to discharge to SNF per recommendations from PT/OT with continued diuresis and transition to PO lasix   Subjective:  Patient reports minimally improved shortness of breath.  Patient continues to require oxygen, is increased to 3 L via nasal cannula overnight.  Patient continues to report leg swelling and orthopnea.   Objective: Temp:  [97.5 F (36.4 C)-98.6 F (37 C)] 97.5 F (36.4 C) (08/13 0436) Pulse Rate:  [59-76] 76 (08/13 0851) Resp:  [17-20] 18 (08/13 0436) BP: (93-101)/(53-76) 101/62 (08/13 0851) SpO2:  [91 %-100 %] 91 % (08/13 0851) Weight:  [154.9 kg] 154.9 kg (08/13 0436)  Physical Exam: General: Obese male, chronically ill-appearing, in no acute distress sitting up in recliner Cardiovascular: Regular rate and rhythm with no murmurs or gallops, bilateral lower extremity edema Respiratory: Decreased basilar lung sounds, rhonchi diffuse and bilateral Abdomen: Obese abdomen, nontender to palpation, positive bowel sounds Extremities: Bilateral lower extremity edema, pitting 2+  Laboratory: Recent Labs  Lab 07/15/19 1512  WBC 7.5  HGB 12.5*  HCT 39.9  PLT 185   Recent Labs  Lab 07/15/19 1512 07/16/19 0337 07/17/19 0353 07/18/19 0538  NA 142 143 140 139  K 3.9 3.9 3.8 3.6  CL 106 103 96* 95*  CO2 26 30 33* 33*  BUN 20 19 20 20   CREATININE 1.49* 1.61* 1.71* 1.58*  CALCIUM 8.7* 8.5* 8.5* 8.4*  PROT 6.0*  --   --   --   BILITOT 0.8  --   --   --  ALKPHOS 39  --   --   --   ALT 26  --   --   --   AST 26  --   --   --   GLUCOSE 98 116* 94 96    Imaging/Diagnostic Tests: No new imaging  Nicki Guadalajara, MD 07/18/2019, 9:23 AM PGY-1, [facility] Medicine FPTS Intern pager: [phone], text pages welcome

## 2019-07-18 NOTE — Progress Notes (Addendum)
Patient has 2 wounds (1 on abdomen and one on bottom).  Per Patient ruptured blister on abdomen rub from pants- placed foam. Sore on bottom- from sitting in chair at home-  Cleansed and placed foam  Patient able to turn on his own- encouraged patient to shift his weight every 2 hours to take pressure off wound

## 2019-07-18 NOTE — Progress Notes (Addendum)
Family Medicine Teaching Service Daily Progress Note Intern Pager: [phone]  Patient name: Charles Mckinney Medical record number: [id number] Date of birth: [birth year] Age: 68 y.o. Gender: male  Primary Care Provider: Tsosie Billing, MD (Inactive) Consultants: Cardiology Code Status: Full code  Assessment and Plan: Mr. Thoma is a 68 year old male presented with acute exacerbation of heart failure with reduced ejection fraction and volume overload.  Acute on chronic combined HFrEF exacerbation:  Patient reports improved shortness of breath Patient not currently on nasal cannula and breathing comfortably. Patient reports continued cough with sputum production.  Patient had Mucinex last night. Output 1121mL o/n, intake 1101 only down Net -24 mLs.  Patient did not have nighttime Lasix due to hypotension and was administered 150 mLs of fluids. Will increase Lasix.    Creatinine now stable at 1.58 Hypotensive to 89/69 overnight, held p.m. Coreg  -reduce Coreg to 3.06 mg twice daily -Held [place] due to hypotension overnight, plan to resume 24-26 dose twice daily -Held nighttime Lasix due to hypotension -Increase Lasix 80 mg to 100 mg  New productive cough: Patient developed productive cough overnight.  Patient remained afebrile.  Sputum contains no blood.  Patient continues to have rhonchi bilaterally on pulmonary exam. -Will do chest x-ray  -Patient given Mucinex overnight  Hypotension and bradycardia: Systolic blood pressure range 71-112/54-76 dias, 98/58 today -Patient seen by cardiology overnight for hypertension given 1 fluid bolus  Atrial fibrillation: Continue Eliquis 5 mg twice daily  CKD: -Creatinine now down to 1.5 and stable -Sinew to monitor with BMP  Hypothyroidism: Amiodarone induced hypothyroidism. -Continue 50 mcg Synthroid  FEN/GI: Heart healthy PPx: Eliquis 5 mg twice daily  Disposition: Discharge to skilled nursing facility pending successful  diuresis and volume reduction  Subjective:   Patient reports proved shortness of breath.  Patient states that he has been coughing up sputum.  Patient states that Mucinex and Flonase have minimally helped him yet he still feels congested.  Objective: Temp:  [98 F (36.7 C)-98.4 F (36.9 C)] 98 F (36.7 C) (08/14 0357) Pulse Rate:  [55-83] 60 (08/14 0940) Resp:  [20] 20 (08/14 0357) BP: (71-112)/(54-76) 98/58 (08/14 0943) SpO2:  [88 %-100 %] 94 % (08/14 0940) Weight:  [155.8 kg] 155.8 kg (08/14 0457)   Intake/Output Summary (Last 24 hours) at 07/19/2019 0954 Last data filed at 07/19/2019 1478 Gross per 24 hour  Intake 861.02 ml  Output 1325 ml  Net -463.98 ml    Physical Exam: General: Obese male, with nasal cannula, sitting in recliner in no acute distress Cardiovascular: Regular rate and rhythm, no murmurs or gallops.  2+ pitting edema in bilateral lower extremities Respiratory: Rhonchi on exam, patient with no increased work of breathing, patient does not cough during exam Abdomen: Obese abdomen with distention, positive bowel nontender to palpation Extremities: Left leg in boot, bilateral pitting edema in lower extremities  Laboratory: Recent Labs  Lab 07/15/19 1512  WBC 7.5  HGB 12.5*  HCT 39.9  PLT 185   Recent Labs  Lab 07/15/19 1512  07/17/19 0353 07/18/19 0538 07/19/19 0543  NA 142   < > 140 139 138  K 3.9   < > 3.8 3.6 3.7  CL 106   < > 96* 95* 95*  CO2 26   < > 33* 33* 31  BUN 20   < > 20 20 21   CREATININE 1.49*   < > 1.71* 1.58* 1.58*  CALCIUM 8.7*   < > 8.5* 8.4* 8.5*  PROT 6.0*  --   --   --   --  BILITOT 0.8  --   --   --   --   ALKPHOS 39  --   --   --   --   ALT 26  --   --   --   --   AST 26  --   --   --   --   GLUCOSE 98   < > 94 96 92   < > = values in this interval not displayed.    Imaging/Diagnostic Tests: Chest x-ray results: Bilateral pleural effusions with underlying atelectasis  Nicki Guadalajara, MD 07/19/2019, 9:54  AM PGY-1, [place] [facility] FPTS Intern pager: [phone], text pages welcome

## 2019-07-18 NOTE — Progress Notes (Signed)
Orthopedic Tech Progress Note Patient Details:  Charles Mckinney 02/03/1951 [id number]  Ortho Devices Type of Ortho Device: Louretta Parma boot Ortho Device/Splint Location: Bilateral unna boots Ortho Device/Splint Interventions: Application   Post Interventions Patient Tolerated: Well Instructions Provided: Care of device   [place] Pink 07/18/2019, 2:10 PM

## 2019-07-18 NOTE — Progress Notes (Addendum)
Check BP prior to giving evening medications.   BP dropped after bolus.     Patient also having some congestions/SOB.    Paged MD-  MD is going to come see patient.  Also going to order some flonase and mucinex for congestion.    Per MD will hold evening lasix and coreg

## 2019-07-18 NOTE — Progress Notes (Signed)
   Notified by RN that patient was hypotensive to the 70s/50s. RN reported he was feeling lightheaded, diaphoretic, and pale. Medications reviewed. Appears he was re-started on max dose entresto today despite soft blood pressures over the past 24 hours. Will give a small IVF bolus and monitor for improvement in BP. Will hold entresto today with plans to start at a lower dose tomorrow which can be uptitrated pending BP response.   Abigail Butts, PA-C 07/18/19; 1:56 PM

## 2019-07-18 NOTE — Care Management Important Message (Signed)
Important Message  Patient Details  Name: TERRIAN SENTELL MRN: [id number] Date of Birth: [birth year]   Medicare Important Message Given:  Yes     Shelda Altes 07/18/2019, 12:28 PM

## 2019-07-19 ENCOUNTER — Encounter (HOSPITAL_COMMUNITY): Payer: Self-pay | Admitting: Family Medicine

## 2019-07-19 ENCOUNTER — Inpatient Hospital Stay (HOSPITAL_COMMUNITY): Payer: Medicare Other

## 2019-07-19 DIAGNOSIS — R059 Cough, unspecified: Secondary | ICD-10-CM | POA: Diagnosis not present

## 2019-07-19 DIAGNOSIS — I4819 Other persistent atrial fibrillation: Secondary | ICD-10-CM

## 2019-07-19 DIAGNOSIS — I714 Abdominal aortic aneurysm, without rupture: Secondary | ICD-10-CM

## 2019-07-19 DIAGNOSIS — R05 Cough: Secondary | ICD-10-CM

## 2019-07-19 LAB — BASIC METABOLIC PANEL
Anion gap: 12 (ref 5–15)
BUN: 21 mg/dL (ref 8–23)
CO2: 31 mmol/L (ref 22–32)
Calcium: 8.5 mg/dL — ABNORMAL LOW (ref 8.9–10.3)
Chloride: 95 mmol/L — ABNORMAL LOW (ref 98–111)
Creatinine, Ser: 1.58 mg/dL — ABNORMAL HIGH (ref 0.61–1.24)
GFR calc Af Amer: 51 mL/min — ABNORMAL LOW (ref >60)
GFR calc non Af Amer: 44 mL/min — ABNORMAL LOW (ref >60)
Glucose, Bld: 92 mg/dL (ref 70–99)
Potassium: 3.7 mmol/L (ref 3.5–5.1)
Sodium: 138 mmol/L (ref 135–145)

## 2019-07-19 NOTE — Progress Notes (Signed)
Pt wants to sleep on recliner chair. Will monitor

## 2019-07-19 NOTE — TOC Progression Note (Addendum)
Transition of Care (TOC) - Progression Note  *07/19/19 - Patient given Bed Offers   Patient Details  Name: Charles Mckinney MRN: [id number] Date of Birth: 09-Mar-1951  Transition of [facility]) CM/SW Contact  Sharlet Salina Mila Homer, LCSW Phone Number: 07/19/2019, 6:05 PM  Clinical Narrative:  CSW visited with patient and provided him with his bed offers, less [facility] as he does not want to return there. Patient is interested in [place] as his sister is there. Also talked by phone with patient's son [name] ([phone]) regarding the discharge plan. He plans to visit with dad tomorrow-Sat., 8/15.      Expected Discharge Plan: Skilled Nursing Facility Barriers to Discharge: Continued Medical Work up  Expected Discharge Plan and Services Expected Discharge Plan: [place] In-house Referral: Clinical Social Work                                           Social Determinants of Health (SDOH) Interventions  No SDOH interventions needed at this time.  Readmission Risk Interventions No flowsheet data found.

## 2019-07-19 NOTE — Progress Notes (Signed)
Patient would not give Dulera back to me, stated that he wanted to keep it at bedside in case he needed it.  I explained that he should have RN call us or let RN know that he wanted his medication.  I explained that this medication was maintenance med.  Tried to explain about rescue inhalers and maintenance meds.  Patient stated that he could have hid it and never let me know that he had it.  Will alert RN.

## 2019-07-19 NOTE — Progress Notes (Signed)
Physical Therapy Treatment Patient Details Name: Charles Mckinney MRN: [id number] DOB: 05/22/1951 Today's Date: 07/19/2019    History of Present Illness 68 yo admitted with SOB and CHF exacerbation. PMH is significant for CAD s/p CABG x4, HTN, HLD, GERD, A fib on eliquis, HFrEF (EF 30-35%), gout, OSA    PT Comments    Pt pleasant and reports wanting to be able to move more and walk today. BP in sitting 89/60 (71), HR 66 with drop to 80-52 (60) with HR 112 with standing. Pt able to perform initial stand very briefly then immediately sat due to knee pain, pt then able to stand 2 additional trials for strengthening and pericare but unable to walk due to BP and knee pain. Pt educated for HEP and encouraged to mobilize with nursing but continues to demonstrate poor activity tolerance and inability to care for himself at home with SNF appropriate.     Follow Up Recommendations  SNF;Supervision/Assistance - 24 hour     Equipment Recommendations  Wheelchair (measurements PT);Wheelchair cushion (measurements PT)    Recommendations for Other Services       Precautions / Restrictions Precautions Precautions: Fall Precaution Comments: watch BP Restrictions Weight Bearing Restrictions: No    Mobility  Bed Mobility               General bed mobility comments: sitting in recliner on arrival  Transfers Overall transfer level: Needs assistance   Transfers: Sit to/from Stand Sit to Stand: Min guard         General transfer comment: guarding for lines and safety, cues for hand placement. Pt stood x 3 trials: 5 sec, 1 min, 1 min unable to stand longer due to knee pain with drop in BP  Ambulation/Gait             General Gait Details: unable due to drop in BP and poor tolerance for standing with knee pain this session   Stairs             Wheelchair Mobility    Modified Rankin (Stroke Patients Only)       Balance Overall balance assessment: Needs  assistance Sitting-balance support: No upper extremity supported;Feet supported Sitting balance-Leahy Scale: Fair     Standing balance support: Bilateral upper extremity supported Standing balance-Leahy Scale: Poor Standing balance comment: reliant on bil UE support in standing                            Cognition Arousal/Alertness: Awake/alert Behavior During Therapy: Flat affect Overall Cognitive Status: No family/caregiver present to determine baseline cognitive functioning Area of Impairment: Memory;Safety/judgement;Problem solving                     Memory: Decreased short-term memory;Decreased recall of precautions   Safety/Judgement: Decreased awareness of safety;Decreased awareness of deficits   Problem Solving: Slow processing        Exercises Total Joint Exercises Hip ABduction/ADduction: AAROM;10 reps;Both;Seated Long Arc Quad: AROM;Both;Seated;20 reps(5 reps at a time) Marching in Standing: AROM;Both;Seated;15 reps(5 reps at a time)    General Comments        Pertinent Vitals/Pain Pain Score: 5  Pain Location: bil knee pain, aching Pain Descriptors / Indicators: Aching;Constant Pain Intervention(s): Limited activity within patient's tolerance;Repositioned    Home Living                      Prior Function  PT Goals (current goals can now be found in the care plan section) Progress towards PT goals: Not progressing toward goals - comment(limited by BP and knee pain)    Frequency    Min 2X/week      PT Plan Current plan remains appropriate    Co-evaluation              AM-PAC PT "6 Clicks" Mobility   Outcome Measure  Help needed turning from your back to your side while in a flat bed without using bedrails?: A Little Help needed moving from lying on your back to sitting on the side of a flat bed without using bedrails?: A Little Help needed moving to and from a bed to a chair (including a  wheelchair)?: A Little Help needed standing up from a chair using your arms (e.g., wheelchair or bedside chair)?: A Little Help needed to walk in hospital room?: A Little Help needed climbing 3-5 steps with a railing? : Total 6 Click Score: 16    End of Session   Activity Tolerance: Patient limited by fatigue;Patient limited by pain;Other (comment)(orthostatic hypotension) Patient left: in chair;with call bell/phone within reach;with chair alarm set(chair alarm system not working in room with nursing aware) Nurse Communication: Mobility status PT Visit Diagnosis: Other abnormalities of gait and mobility (R26.89);Muscle weakness (generalized) (M62.81);History of falling (Z91.81)     Time: 6314-9702 PT Time Calculation (min) (ACUTE ONLY): 27 min  Charges:  $Therapeutic Exercise: 8-22 mins $Therapeutic Activity: 8-22 mins                     Dhruvi Crenshaw Abner Greenspan, PT Acute Rehabilitation Services Pager: [phone] Office: [phone]    Levina Boyack B Rhondalyn Clingan 07/19/2019, 1:11 PM

## 2019-07-19 NOTE — Progress Notes (Signed)
Patient also refused CPAP.

## 2019-07-19 NOTE — Progress Notes (Signed)
Pt refused fall precaution/ bed alarm and chair alarm. Pt stated he will sleep on the recliner after pt been on bed for 2 hrs.  Spoke to pt about the dulera/ inhaler, to call if pt needs it. Ruthe Mannan was kept on nurses computer, away from pt. Will monitor

## 2019-07-19 NOTE — TOC Initial Note (Signed)
Transition of Care [facility]) - Initial/Assessment Note    Patient Details  Name: Charles Mckinney MRN: [id number] Date of Birth: 1951-10-26  Transition of Care [facility]) CM/SW Contact:    Charles Goldmann, LCSW Phone Number: 07/19/2019, 12:09 PM  Clinical Narrative:  Talked with patient at the bedside regarding his discharge disposition and recommendation of ST rehab. Charles Mckinney was sitting up in the chair and was alert, oriented and agreeable to talking with CSW. Patient 's speech was difficult to understand and he was asked repeatedly by CSW to repeat things he said.   Charles Mckinney reported that he has recently been to a facility for rehab this year and was only there for about 2 weeks and then was discharge CSW learned through chart review that patient went to [place] in May. Charles Mckinney agreeable to returning to rehab and the facility search process was explained and he was provided with the Medicare.gov facility list for [facility] CSW advised that his sister is currently at [facility]. When asked, patient responded that he has 2 sons, Charles Mckinney and Charles Mckinney, but they don't stay with him. Charles Mckinney added that their mother comes by at times to see him.                 Expected Discharge Plan: Skilled Nursing Facility Barriers to Discharge: Continued Medical Work up   Patient Goals and CMS Choice Patient states their goals for this hospitalization and ongoing recovery are:: Patient expressed understanding that he needs rehab to help him get stronger before going home because he lives alone CMS Medicare.gov Compare Post Acute Care list provided to:: Patient Choice offered to / list presented to : Patient  Expected Discharge Plan and Services Expected Discharge Plan: Skilled Nursing Facility In-house Referral: Clinical Social Work                                           Prior Living Arrangements/Services   Lives with:: Self Patient language and  need for interpreter reviewed:: No Do you feel safe going back to the place where you live?: No      Need for Family Participation in Patient Care: Yes (Comment) Care giver support system in place?: No (comment) Current home services: DME Criminal Activity/Legal Involvement Pertinent to Current Situation/Hospitalization: No - Comment as needed  Activities of Daily Living Home Assistive Devices/Equipment: Eyeglasses, Dan Humphreys (specify type) ADL Screening (condition at time of admission) [name] cognitive ability adequate to safely complete daily activities?: No Is the patient deaf or have difficulty hearing?: No Does the patient have difficulty seeing, even when wearing glasses/contacts?: No Does the patient have difficulty concentrating, remembering, or making decisions?: Yes Patient able to express need for assistance with ADLs?: Yes Does the patient have difficulty dressing or bathing?: No Independently performs ADLs?: Yes (appropriate for developmental age) Does the patient have difficulty walking or climbing stairs?: Yes Weakness of Legs: Both Weakness of Arms/Hands: None  Permission Sought/Granted Permission sought to share information with : Family Supports Permission granted to share information with : Yes, Verbal Permission Granted  Share Information with NAME: Charles Mckinney     Permission granted to share info w Relationship: Son  Permission granted to share info w Contact Information: [phone]  Emotional Assessment Appearance:: Appears stated age Attitude/Demeanor/Rapport: Engaged Affect (typically observed): Appropriate Orientation: : Oriented to Self, Oriented to Place, Oriented  to  Time, Oriented to Situation Alcohol / Substance Use: Tobacco Use, Alcohol Use, Illicit Drugs(Per H&P, patient reported that he quit smoking and does not drink or use illicit drugs) Psych Involvement: No (comment)  Admission diagnosis:  Acute on chronic congestive heart failure,  unspecified heart failure type ([name]) [I50.9] CHF exacerbation ([name]) [I50.9] Patient Active Problem List   Diagnosis Date Noted  . Hypothyroidism due to medication   . CHF exacerbation ([place]) 07/15/2019  . AAA (abdominal aortic aneurysm) without rupture ([place]) 03/14/2019  . Ischemic cardiomyopathy 02/01/2018  . OSA (obstructive sleep apnea) 10/04/2017  . Essential hypertension 05/30/2017  . Gout 05/30/2017  . Chest pain 05/29/2017  . CKD (chronic kidney disease), stage III ([place]) 05/29/2017  . TIA (transient ischemic attack) 04/26/2017  . Hypokalemia 04/26/2017  . Prolonged QT interval 04/26/2017  . Coronary artery disease involving native coronary artery of native heart with angina pectoris ([place]) 02/02/2016  . 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 Acute on chronic combined systolic and diastolic CHF (congestive heart failure) ([place]) 01/19/2015  . Chronic combined systolic and diastolic CHF, NYHA class 3 ([place]) 01/19/2015  . Long term current use of anticoagulant therapy 01/19/2015  . Persistent atrial fibrillation ([place]): CHA2DS2Vasc 6   . GERD 11/22/2007  . Hyperlipidemia with target LDL less than 70   . Morbid obesity ([place])    PCP:  Charles Billing, MD (Inactive) Pharmacy:   [place], [place] - [address] [place] [address] Phone: [phone] Fax: [place], [place] [address] Phone: [phone] Fax: [phone]     Social Determinants of Health (SDOH) Interventions  No SDOH interventions needed at this time  Readmission Risk Interventions No flowsheet data found.

## 2019-07-19 NOTE — Progress Notes (Signed)
Progress Note  Patient Name: Charles Mckinney Date of Encounter: 07/19/2019  Primary Cardiologist: Bryan Lemma, MD   Subjective   Sitting up in the chair. Reports breathing is better. Worried about DC plans  Inpatient Medications    Scheduled Meds: . apixaban  5 mg Oral BID  . carvedilol  3.125 mg Oral BID WC  . fluticasone  1 spray Each Nare Daily  . guaiFENesin  600 mg Oral BID  . levothyroxine  50 mcg Oral Q0600  . mometasone-formoterol  2 puff Inhalation BID  . potassium chloride SA  20 mEq Oral Daily  . pravastatin  40 mg Oral Daily  . sacubitril-valsartan  1 tablet Oral BID  . sodium chloride flush  3 mL Intravenous Q12H   Continuous Infusions: . sodium chloride    . furosemide     PRN Meds: sodium chloride, acetaminophen, sodium chloride flush   Vital Signs    Vitals:   07/18/19 1808 07/18/19 1955 07/19/19 0357 07/19/19 0457  BP: (!) 88/59 111/71 112/76   Pulse:  83 66   Resp:  20 20   Temp:  98.4 F (36.9 C) 98 F (36.7 C)   TempSrc:  Oral Oral   SpO2:  100% 95%   Weight:    (!) 155.8 kg  Height:        Intake/Output Summary (Last 24 hours) at 07/19/2019 0912 Last data filed at 07/19/2019 7035 Gross per 24 hour  Intake 1101.02 ml  Output 1325 ml  Net -223.98 ml   Last 3 Weights 07/19/2019 07/18/2019 07/17/2019  Weight (lbs) 343 lb 6.4 oz 341 lb 9.6 oz 345 lb 8 oz  Weight (kg) 155.765 kg 154.949 kg 156.718 kg      Telemetry    Afib rate well controlled- Personally Reviewed  ECG    No new tracing - Personally Reviewed  Physical Exam  Morbidly obese WM, sitting up in the chair.  GEN: No acute distress.   Neck: Difficult to assess JVD due to girth Cardiac: Irreg Irreg, no murmurs, rubs, or gallops.  Respiratory: clear GI: Soft, obese Ext: 3+ LE edema bilaterally clearly less tense than initial; No deformity. Neuro:  Nonfocal  Psych: Normal affect   Labs    High Sensitivity Troponin:  No results for input(s): TROPONINIHS in the last  720 hours.    Cardiac EnzymesNo results for input(s): TROPONINI in the last 168 hours. No results for input(s): TROPIPOC in the last 168 hours.   Chemistry Recent Labs  Lab 07/15/19 1512  07/17/19 0353 07/18/19 0538 07/19/19 0543  NA 142   < > 140 139 138  K 3.9   < > 3.8 3.6 3.7  CL 106   < > 96* 95* 95*  CO2 26   < > 33* 33* 31  GLUCOSE 98   < > 94 96 92  BUN 20   < > 20 20 21   CREATININE 1.49*   < > 1.71* 1.58* 1.58*  CALCIUM 8.7*   < > 8.5* 8.4* 8.5*  PROT 6.0*  --   --   --   --   ALBUMIN 3.4*  --   --   --   --   AST 26  --   --   --   --   ALT 26  --   --   --   --   ALKPHOS 39  --   --   --   --   BILITOT 0.8  --   --   --   --  GFRNONAA 48*   < > 40* 44* 44*  GFRAA 55*   < > 47* 51* 51*  ANIONGAP 10   < > 11 11 12    < > = values in this interval not displayed.     Hematology Recent Labs  Lab 07/15/19 1512  WBC 7.5  RBC 3.82*  HGB 12.5*  HCT 39.9  MCV 104.5*  MCH 32.7  MCHC 31.3  RDW 14.3  PLT 185    BNP Recent Labs  Lab 07/15/19 1512  BNP 298.0*     DDimer No results for input(s): DDIMER in the last 168 hours.   Radiology    No results found.  Cardiac Studies   N/a  Patient Profile     68 y.o. 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  Assessment & Plan    1. Acute on Chronic combined HF: remains on IV lasix 100 mg BID. I/O negative 1325 cc yesterday.  Weight down from admit but a little higher than yesterday. Net - 4.6 L.  Still with significant overload on exam. Continue diuresis with monitoring of lytes and Cr. He became hypotensive yesterday and Delene Loll was held. Will resume at 24/26 mg bid and monitor. May be able to titrate as BP allows.  2. Permanent Afib: Had been on dig and amiodarone. Dig was stopped prior to admission.  Amiodarone stopped on admission as he has been in afib chronically. Also TSH is now 25 c/w hypothyroidism. Heart rate controlled.  BB dose reduced yesterday due to bradycardia. Afib rate appears well controlled on monitor.  -- on Eliquis  5 mg bid dose.  3. CKD: Cr increased from 1.61>>1.71. now down to 1.58.  Will follow BMET with diuresis.   4. Hypothyroidism: TSH noted at 81. Amiodarone has been stopped. Started on synthroid  per primary.   For questions or updates, please contact [place] Please consult [email or web address] for contact info under        Signed, Peter Martinique, MD  07/19/2019, 9:12 AM

## 2019-07-19 NOTE — NC FL2 (Signed)
[place] MEDICAID FL2 LEVEL OF CARE SCREENING TOOL     IDENTIFICATION  Patient Name: Charles Mckinney Birthdate: 01-Jun-1951 Sex: male Admission Date (Current Location): 07/15/2019  [facility] and [place] Number:  Producer, television/film/video and Address:  [facility] [place], [address], [place], [place] [address]      Provider Number: [id number]  Attending Physician Name and Address:  Nestor Ramp, MD  Relative Name and Phone Number:  Odes Pint - son - [phone]    Current Level of Care: Hospital Recommended Level of Care: Skilled Nursing Facility Prior Approval Number:    Date Approved/Denied:   PASRR Number: [id number]  Discharge Plan: SNF    Current Diagnoses: Patient Active Problem List   Diagnosis Date Noted  . Hypothyroidism due to medication   . CHF exacerbation (HCC) 07/15/2019  . AAA (abdominal aortic aneurysm) without rupture (HCC) 03/14/2019  . Ischemic cardiomyopathy 02/01/2018  . OSA (obstructive sleep apnea) 10/04/2017  . Essential hypertension 05/30/2017  . Gout 05/30/2017  . Chest pain 05/29/2017  . CKD (chronic kidney disease), stage III (HCC) 05/29/2017  . TIA (transient ischemic attack) 04/26/2017  . Hypokalemia 04/26/2017  . Prolonged QT interval 04/26/2017  . Coronary artery disease involving native coronary artery of native heart with angina pectoris (HCC) 02/02/2016  . 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 Acute on chronic combined systolic and diastolic CHF (congestive heart failure) (HCC) 01/19/2015  . Chronic combined systolic and diastolic CHF, NYHA class 3 (HCC) 01/19/2015  . Long term current use of anticoagulant therapy 01/19/2015  . Persistent atrial fibrillation (HCC): CHA2DS2Vasc 6   . GERD 11/22/2007  . Hyperlipidemia with target LDL less than 70   . Morbid obesity (HCC)     Orientation RESPIRATION BLADDER Height & Weight     Self, Time, Situation, Place  O2(4 Liters oxygen) External catheter(placed 07/16/19) Weight:  (!) 343 lb 6.4 oz (155.8 kg) Height:  5\' 10"  (177.8 cm)  BEHAVIORAL SYMPTOMS/MOOD NEUROLOGICAL BOWEL NUTRITION STATUS      Continent Diet(2 gram sodium)  AMBULATORY STATUS COMMUNICATION OF NEEDS Skin   Limited Assist(Upper body min assist and lower mod mod assist) Verbally Other (Comment)(Stage 2 wound to posterior, medial buttocks; abrasion right, lateral lower leg and abdomen; Blister abdomen; MASD bilateral buttocks, groin, abdomen-cleansed and barrier cream applied.)                       Personal Care Assistance Level of Assistance  Bathing, Feeding, Dressing Bathing Assistance: Maximum assistance(Min assist upper body and mod assist lower body) Feeding assistance: Limited assistance(Assistance with set-up) Dressing Assistance: Maximum assistance     Functional Limitations Info  Sight, Hearing, Speech Sight Info: Impaired Hearing Info: Impaired Speech Info: Impaired(Very difficult to understand; speech slurred, Dysarthria)    SPECIAL CARE FACTORS FREQUENCY  PT (By licensed PT), OT (By licensed OT)     PT Frequency: Evaluated 8.11 at hospital. PT at SNF eval and treat, a minimum of 5 times per week OT Frequency: Evaluated 8/11 at hospital; OT at SNF eval and treat, a minimum of 5 times per week            Contractures Contractures Info: Not present    Additional Factors Info  Code Status, Allergies Code Status Info: Full Allergies Info: Niacin and related; Xarelto, Penicillins           Current Medications (07/19/2019):  This is the current hospital active medication list Current Facility-Administered  Medications  Medication Dose Route Frequency Provider Last Rate Last Dose  . 0.9 %  sodium chloride infusion  250 mL Intravenous PRN Shirley, [place], DO      . acetaminophen (TYLENOL) tablet 650 mg  650 mg Oral Q4H PRN Shirley, [place], DO   650 mg at 07/18/19 0935  . apixaban (ELIQUIS) tablet 5 mg  5 mg Oral BID Reino Bellis B, NP   5 mg at 07/19/19 0947  .  carvedilol (COREG) tablet 3.125 mg  3.125 mg Oral BID WC Enid Derry, Martin[place], DO      . fluticasone (FLONASE) 50 MCG/ACT nasal spray 1 spray  1 spray Each Nare Daily Stark Klein, MD   1 spray at 07/19/19 1034  . furosemide (LASIX) 100 mg in dextrose 5 % 50 mL IVPB  100 mg Intravenous BID Shirley, [place], DO 60 mL/hr at 07/19/19 0956 100 mg at 07/19/19 0956  . guaiFENesin (MUCINEX) 12 hr tablet 600 mg  600 mg Oral BID Stark Klein, MD   600 mg at 07/19/19 0947  . levothyroxine (SYNTHROID) tablet 50 mcg  50 mcg Oral Q0600 Shirley, [place], DO   50 mcg at 07/19/19 0456  . mometasone-formoterol (DULERA) 200-5 MCG/ACT inhaler 2 puff  2 puff Inhalation BID Shirley, [place], DO   2 puff at 07/19/19 0093  . potassium chloride SA (K-DUR) CR tablet 20 mEq  20 mEq Oral Daily Enid Derry, [place], DO   20 mEq at 07/19/19 0947  . pravastatin (PRAVACHOL) tablet 40 mg  40 mg Oral Daily Enid Derry, [place], DO   40 mg at 07/19/19 0947  . sacubitril-valsartan (ENTRESTO) 24-26 mg per tablet  1 tablet Oral BID Tommye Standard, Krista M., PA-C      . sodium chloride flush (NS) 0.9 % injection 3 mL  3 mL Intravenous Q12H Enid Derry, [place], DO   3 mL at 07/19/19 0947  . sodium chloride flush (NS) 0.9 % injection 3 mL  3 mL Intravenous PRN Shirley, [place], DO         Discharge Medications: Please see discharge summary for a list of discharge medications.  Relevant Imaging Results:  Relevant Lab Results:   Additional Information [phone]  Sable Feil, LCSW

## 2019-07-20 LAB — BASIC METABOLIC PANEL
Anion gap: 11 (ref 5–15)
BUN: 21 mg/dL (ref 8–23)
CO2: 34 mmol/L — ABNORMAL HIGH (ref 22–32)
Calcium: 8.6 mg/dL — ABNORMAL LOW (ref 8.9–10.3)
Chloride: 95 mmol/L — ABNORMAL LOW (ref 98–111)
Creatinine, Ser: 1.54 mg/dL — ABNORMAL HIGH (ref 0.61–1.24)
GFR calc Af Amer: 53 mL/min — ABNORMAL LOW (ref >60)
GFR calc non Af Amer: 46 mL/min — ABNORMAL LOW (ref >60)
Glucose, Bld: 98 mg/dL (ref 70–99)
Potassium: 3.4 mmol/L — ABNORMAL LOW (ref 3.5–5.1)
Sodium: 140 mmol/L (ref 135–145)

## 2019-07-20 MED ORDER — POTASSIUM CHLORIDE CRYS ER 20 MEQ PO TBCR
40.0000 meq | EXTENDED_RELEASE_TABLET | Freq: Once | ORAL | Status: AC
  Administered 2019-07-20: 40 meq via ORAL
  Filled 2019-07-20: qty 2

## 2019-07-20 NOTE — Discharge Instructions (Signed)

## 2019-07-20 NOTE — TOC Transition Note (Deleted)
Transition of Care [facility]) - CM/SW Discharge Note   Patient Details  Name: GENEVA PALLAS MRN: [id number] Date of Birth: Apr 07, 1951  Transition of Care [facility]) CM/SW Contact:  Bary Castilla, LCSW Phone Number: [phone] 07/20/2019, 4:33 PM   Clinical Narrative:     CSW met with patient bedside to let him know that CSW had reached out to [facility] as requested and they did not extend a bed offers. CSW reviewed current bed offers again. Patient asked CSW to call son.  CSW called son and he stated that him and the patient decided on [facility]. CSW confirmed decision with patient and he was in agreement.  CSW called Abigail Butts at [employer] and she stated that she has started the authorization with [facility].  CSW will continue to follow for discharge planning.   Final next level of care: Skilled Nursing Facility Barriers to Discharge: Continued Medical Work up   Patient Goals and CMS Choice Patient states their goals for this hospitalization and ongoing recovery are:: Patient expressed understanding that he needs rehab to help him get stronger before going home because he lives alone CMS Medicare.gov Compare Post Acute Care list provided to:: Patient Choice offered to / list presented to : Patient  Discharge Placement                       Discharge Plan and Services In-house Referral: Clinical Social Work                                   Social Determinants of Health ([place]) Interventions     Readmission Risk Interventions No flowsheet data found.

## 2019-07-20 NOTE — Progress Notes (Signed)
Received a call form CCMD stating  Pt. HR went down to 20's but non sustained, pt asymptomatic. MD paged spoke with Dr. Rosita Fire, no new orders.

## 2019-07-20 NOTE — Progress Notes (Signed)
Progress Note  Patient Name: Charles Mckinney Date of Encounter: 07/20/2019  Primary Cardiologist: Glenetta Hew, MD   Subjective   No complaints. Diuresed another 2L negative overnight. Creatinine stable at 1.54 today. Mild hypokalemia at 3.4.  Inpatient Medications    Scheduled Meds: . apixaban  5 mg Oral BID  . carvedilol  3.125 mg Oral BID WC  . fluticasone  1 spray Each Nare Daily  . guaiFENesin  600 mg Oral BID  . levothyroxine  50 mcg Oral Q0600  . mometasone-formoterol  2 puff Inhalation BID  . potassium chloride SA  20 mEq Oral Daily  . pravastatin  40 mg Oral Daily  . sacubitril-valsartan  1 tablet Oral BID  . sodium chloride flush  3 mL Intravenous Q12H   Continuous Infusions: . sodium chloride    . furosemide 100 mg (07/20/19 0903)   PRN Meds: sodium chloride, acetaminophen, sodium chloride flush   Vital Signs    Vitals:   07/20/19 0416 07/20/19 0802 07/20/19 0900 07/20/19 0909  BP: 91/61  95/61 98/60  Pulse: 65  68   Resp: 18     Temp: 97.7 F (36.5 C)     TempSrc: Oral     SpO2: 99% 98%    Weight: (!) 154.2 kg     Height:        Intake/Output Summary (Last 24 hours) at 07/20/2019 0940 Last data filed at 07/20/2019 0600 Gross per 24 hour  Intake 840 ml  Output 2950 ml  Net -2110 ml   Last 3 Weights 07/20/2019 07/19/2019 07/18/2019  Weight (lbs) 339 lb 14.4 oz 343 lb 6.4 oz 341 lb 9.6 oz  Weight (kg) 154.178 kg 155.765 kg 154.949 kg      Telemetry    Afib with CVR- Personally Reviewed  ECG    N/A  Physical Exam   GEN: No acute distress, awake, morbidly obese.   Neck: Difficult to assess JVP due to neck thickness Cardiac: Irreg Irreg, no murmurs, rubs, or gallops.  Respiratory: clear GI: Soft, obese Ext: 3+ LE edema bilaterally, legs wrapped Neuro:  Nonfocal  Psych: Normal affect   Labs    High Sensitivity Troponin:  No results for input(s): TROPONINIHS in the last 720 hours.    Cardiac EnzymesNo results for input(s):  TROPONINI in the last 168 hours. No results for input(s): TROPIPOC in the last 168 hours.   Chemistry Recent Labs  Lab 07/15/19 1512  07/18/19 0538 07/19/19 0543 07/20/19 0437  NA 142   < > 139 138 140  K 3.9   < > 3.6 3.7 3.4*  CL 106   < > 95* 95* 95*  CO2 26   < > 33* 31 34*  GLUCOSE 98   < > 96 92 98  BUN 20   < > 20 21 21   CREATININE 1.49*   < > 1.58* 1.58* 1.54*  CALCIUM 8.7*   < > 8.4* 8.5* 8.6*  PROT 6.0*  --   --   --   --   ALBUMIN 3.4*  --   --   --   --   AST 26  --   --   --   --   ALT 26  --   --   --   --   ALKPHOS 39  --   --   --   --   BILITOT 0.8  --   --   --   --   GFRNONAA 48*   < >  44* 44* 46*  GFRAA 55*   < > 51* 51* 53*  ANIONGAP 10   < > 11 12 11    < > = values in this interval not displayed.     Hematology Recent Labs  Lab 07/15/19 1512  WBC 7.5  RBC 3.82*  HGB 12.5*  HCT 39.9  MCV 104.5*  MCH 32.7  MCHC 31.3  RDW 14.3  PLT 185    BNP Recent Labs  Lab 07/15/19 1512  BNP 298.0*     DDimer No results for input(s): DDIMER in the last 168 hours.   Radiology    Dg Chest 2 View  Result Date: 07/19/2019 CLINICAL DATA:  Shortness of breath.  Cough. EXAM: CHEST - 2 VIEW COMPARISON:  July 15, 2019 FINDINGS: There is a left-sided pleural effusion with underlying opacity, likely atelectasis, unchanged. There is a probable tiny right effusion with atelectasis, unchanged. No other changes. IMPRESSION: Bilateral pleural effusions, left greater than right, with underlying atelectasis. No other changes. Electronically Signed   By: Gerome Sam III M.D   On: 07/19/2019 09:36    Cardiac Studies   N/a  Patient Profile     68 y.o. 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  Assessment & Plan    1. Acute on Chronic combined HF: remains  on IV lasix 100 mg BID. I/O negative 1325 cc yesterday.  Weight down from admit but a little higher than yesterday. Net - 4.6 L.  Still with significant overload on exam.   - BP remains soft on Entresto and low dose coreg - monitor  2. Permanent Afib: Had been on dig and amiodarone. Dig was stopped prior to admission. Amiodarone stopped on admission as he has been in afib chronically. Also TSH is now 81 c/w hypothyroidism. Heart rate controlled.  BB dose reduced yesterday due to bradycardia. Afib rate appears well controlled on monitor.  -- on Eliquis  5 mg bid dose.  3. CKD: Creatinine stable with diuresis.  4. Hypothyroidism: TSH noted at 81. Amiodarone has been stopped. Started on synthroid  per primary, however, suspect he will need a higher dose given body weight and significant TSH elevation in the 70's - consider 75-100 mcg daily.  5. Hypokalemia - replete  For questions or updates, please contact [facility] Please consult [email or web address] for contact info under   Chrystie Nose, MD, Milagros Loll  [place]  [facility]  Medical Director of the [facility]  Card[facility]ovascular [facility] Reduction Clinic Diplomate of the American Board of Clinical Lipidology Attending Cardiologist  Direct Dial: [phone]  Fax: [phone]  Website:  [email or web address]  Chrystie Nose, MD  07/20/2019, 9:40 AM

## 2019-07-20 NOTE — Progress Notes (Addendum)
Family Medicine Teaching Service Daily Progress Note Intern Pager: [phone]  Patient name: Charles Mckinney Medical record number: [id number] Date of birth: [birth year] Age: 68 y.o. Gender: male  Primary Care Provider: Josiah Lobo, MD (Inactive) Consultants: Cardiology, PT/OT Code Status: Full code   Assessment and Plan: Charles Mckinney is 68 year old male with acute on chronic heart failure with reduced ejection fraction exacerbation with hypotension and bradycardia.  Acute on HF Exacerbation Patient reports improved SOB with resting but contiues to feel SOB while walking down the hall. Patient prefers to have nasal canula on while sleeping upright in the recliner.  Patient has a urine output of 1.9 L overnight, weight is down 1.6 kg overnight.  Patient is approaching estimated dry weight of 330 pounds at 339 pounds today.  On admission, patient weighed 354 pounds Creatinine is stable at 1.5 -Continue to diurese with 100mg  Lasix  -start 12.5 spironolactone   Productive cough: Patient continues to have productive cough.  Patient denies hemoptysis.  Patient remains afebrile.  Chest x-ray was completed showing no changes from chest x-ray on admission. -Continue to monitor for signs of fever or worsening cough  Atrial fibrillation: Patient previously on digoxin and amiodarone, these have since been discontinued.  Atrial fibrillation well controlled. -Cardiology following -Continue Eliquis 5 mg twice daily  Hypokalemia: -Continue home potassium 20 mEq -Will replete with 40 additional mEq  Hypothyroidism: Continue Synthroid 50 mg  CKD stage 3: Cr. Stable at 1.5  -continue to monitor with daily BMPs  Hypotension/Bradycardia: BP ranges overnight 91-99/53-71 Patient asymptomatic Patient still with low Bps overnight  -Low-dose Coreg and Entresto, with soft blood pressures overnight. -We will continue to monitor vitals  Gout: patient does not report of joint pain at this  time  -will continue to monitor for evidence of gout flare   Lower Extremity Venous Stasis: bilateral unna boots maintained    FEN/GI: heart healthy  PPx: Eliquis 5mg  BID   Disposition: medically improved with continued diuresis and decreased oxygen requirement, planning for discharge to SNF pending acceptance   Subjective:   Patient reports still feeling SOB when he exerts himself but that his breathing is better while sitting down. Patient also reports weakness in his legs and that he has a hard time walking into his home.  Patient reports having trouble standing for long periods of time and prefers sleeping in the recliner because he can breathe better   Objective: Temp:  [97.7 F (36.5 C)-98.5 F (36.9 C)] 97.7 F (36.5 C) (08/15 0416) Pulse Rate:  [58-72] 68 (08/15 0900) Resp:  [18-20] 18 (08/15 0416) BP: (91-99)/(53-71) 98/60 (08/15 0909) SpO2:  [93 %-99 %] 98 % (08/15 0802) Weight:  [154.2 kg] 154.2 kg (08/15 0416)   Physical Exam: General: obese male sitting in recliner, in NAD, patient has no nasal canula this morning  Cardiovascular: Irregularly irregular, no rubs, no gallops, edema Respiratory: Bilateral diffuse rhonchi, no increased work of breathing Abdomen: Obese abdomen, mild distention, no tenderness to palpation, positive bowel sounds Extremities: Bilateral lower extremities with eating edema, wrapped in Unna boots  Laboratory: Recent Labs  Lab 07/15/19 1512  WBC 7.5  HGB 12.5*  HCT 39.9  PLT 185   Recent Labs  Lab 07/15/19 1512  07/18/19 0538 07/19/19 0543 07/20/19 0437  NA 142   < > 139 138 140  K 3.9   < > 3.6 3.7 3.4*  CL 106   < > 95* 95* 95*  CO2 26   < >  33* 31 34*  BUN 20   < > 20 21 21   CREATININE 1.49*   < > 1.58* 1.58* 1.54*  CALCIUM 8.7*   < > 8.4* 8.5* 8.6*  PROT 6.0*  --   --   --   --   BILITOT 0.8  --   --   --   --   ALKPHOS 39  --   --   --   --   ALT 26  --   --   --   --   AST 26  --   --   --   --   GLUCOSE 98   < >  96 92 98   < > = values in this interval not displayed.    Imaging/Diagnostic Tests: 07/19/19: Chest x-ray: Shows no changes since admission with pleural effusions bilaterally and no pulmonary infiltrates, some atelectasis  Stark Klein, MD 07/20/2019, 10:24 AM PGY-1, [place] Intern pager: [phone], text pages welcome

## 2019-07-21 LAB — BASIC METABOLIC PANEL
Anion gap: 13 (ref 5–15)
BUN: 22 mg/dL (ref 8–23)
CO2: 29 mmol/L (ref 22–32)
Calcium: 8.4 mg/dL — ABNORMAL LOW (ref 8.9–10.3)
Chloride: 93 mmol/L — ABNORMAL LOW (ref 98–111)
Creatinine, Ser: 1.61 mg/dL — ABNORMAL HIGH (ref 0.61–1.24)
GFR calc Af Amer: 50 mL/min — ABNORMAL LOW (ref >60)
GFR calc non Af Amer: 43 mL/min — ABNORMAL LOW (ref >60)
Glucose, Bld: 90 mg/dL (ref 70–99)
Potassium: 3.8 mmol/L (ref 3.5–5.1)
Sodium: 135 mmol/L (ref 135–145)

## 2019-07-21 MED ORDER — FUROSEMIDE 10 MG/ML IJ SOLN
100.0000 mg | Freq: Two times a day (BID) | INTRAVENOUS | Status: DC
  Administered 2019-07-21 – 2019-07-25 (×8): 100 mg via INTRAVENOUS
  Filled 2019-07-21 (×9): qty 10

## 2019-07-21 MED ORDER — METOPROLOL TARTRATE 12.5 MG HALF TABLET
12.5000 mg | ORAL_TABLET | Freq: Two times a day (BID) | ORAL | Status: DC
  Administered 2019-07-21 – 2019-07-25 (×7): 12.5 mg via ORAL
  Filled 2019-07-21 (×7): qty 1

## 2019-07-21 MED ORDER — TORSEMIDE 20 MG PO TABS
40.0000 mg | ORAL_TABLET | Freq: Two times a day (BID) | ORAL | Status: DC
  Administered 2019-07-21: 40 mg via ORAL
  Filled 2019-07-21: qty 2

## 2019-07-21 NOTE — Progress Notes (Signed)
Occupational Therapy Treatment Patient Details Name: Charles Mckinney MRN: [id number] DOB: 1951-08-12 Today's Date: 07/21/2019    History of present illness 68 y.o. admitted with SOB and CHF exacerbation. PMH is significant for CAD s/p CABG x4, HTN, HLD, GERD, A fib on eliquis, HFrEF (EF 30-35%), gout, OSA   OT comments  Continue to recommend SNF for rehab. Pt concerned about getting his clothes prior to leaving hospital and would not put on nasal cannula. Education provided on AE and attempted in session but pt Not progressing toward goals from last session. Will continue to monitor.  Follow Up Recommendations  SNF;Supervision/Assistance - 24 hour    Equipment Recommendations  3 in 1 bedside commode    Recommendations for Other Services      Precautions / Restrictions Precautions Precautions: Fall Precaution Comments: watch BP Restrictions Weight Bearing Restrictions: No       Mobility Bed Mobility               General bed mobility comments: not assessed  Transfers Overall transfer level: Needs assistance Equipment used: Rolling walker (2 wheeled) Transfers: Sit to/from Stand;Stand Pivot Transfers Sit to Stand: Min guard Stand pivot transfers: Min guard            Balance                                           ADL either performed or assessed with clinical judgement   ADL Overall ADL's : Needs assistance/impaired                     Lower Body Dressing: Total assistance;Sitting/lateral leans;With adaptive equipment   Toilet Transfer: Min guard;Ambulation;RW           Functional mobility during ADLs: Min guard;Rolling walker General ADL Comments: Educated on AE and pt attempted to don/doff socks, but had difficulty and said he could not see and the wrap on his leg was hindering him. Educated on safety and energy conservation.      Vision       Perception     Praxis      Cognition Arousal/Alertness:  Awake/alert Behavior During Therapy: Flat affect Overall Cognitive Status: No family/caregiver present to determine baseline cognitive functioning Area of Impairment: Safety/judgement                         Safety/Judgement: Decreased awareness of safety     General Comments: pt not agreeable to putting on Oxygen and kept talking about getting his clothes        Exercises     Shoulder Instructions       General Comments      Pertinent Vitals/ Pain       Pain Assessment: 0-10 Pain Score: (5-6) Pain Location: buttocks Pain Descriptors / Indicators: Sore Pain Intervention(s): Monitored during session  Home Living                                          Prior Functioning/Environment              Frequency  Min 2X/week        Progress Toward Goals  OT Goals(current goals can now be found in the care plan section)  Progress towards OT goals: Not progressing toward goals - comment-will continue to monitor  Acute Rehab OT Goals Patient Stated Goal: get his clothes OT Goal Formulation: With patient Time For Goal Achievement: 07/30/19 Potential to Achieve Goals: Good ADL Goals Pt Will Perform Grooming: with modified independence;standing;sitting Pt Will Perform Lower Body Bathing: with modified independence;sit to/from stand;with adaptive equipment Pt Will Perform Lower Body Dressing: with modified independence;sit to/from stand;with adaptive equipment Pt Will Transfer to Toilet: with modified independence;bedside commode;ambulating Pt Will Perform Toileting - Clothing Manipulation and hygiene: with modified independence;sit to/from stand Pt/caregiver will Perform Home Exercise Program: Increased strength;Both right and left upper extremity;With written HEP provided;With theraband  Plan Discharge plan remains appropriate    Co-evaluation                 AM-PAC OT "6 Clicks" Daily Activity     Outcome Measure   Help from  another person eating meals?: None Help from another person taking care of personal grooming?: A Little Help from another person toileting, which includes using toliet, bedpan, or urinal?: A Little Help from another person bathing (including washing, rinsing, drying)?: A Lot Help from another person to put on and taking off regular upper body clothing?: A Little Help from another person to put on and taking off regular lower body clothing?: Total 6 Click Score: 16    End of Session Equipment Utilized During Treatment: Gait belt;Rolling walker;Other (comment)(AE)  OT Visit Diagnosis: Muscle weakness (generalized) (M62.81);Other (comment)(low activity tolerance)   Activity Tolerance Other (comment)(pt not very pleasant )   Patient Left in chair;with call bell/phone within reach   Nurse Communication Other (comment)(notified her about session/O2)        Time: 1205-1224 OT Time Calculation (min): 19 min  Charges: OT General Charges $OT Visit: 1 Visit OT Treatments $Self Care/Home Management : 8-22 mins     Ardeen Jourdain Alsie Younes OTR/L 07/21/2019, 12:51 PM

## 2019-07-21 NOTE — Progress Notes (Addendum)
RN walked in patient room , patient sitting in chair, O2 off, sats 88-89%on RA, put patient on 2L sats up to 95% on 2L.  Patient B/P reading 80/60 with automatic,RN rechecked manual 84/64, MD paged from [facility] services and Cardiologist MD made aware while rounding with patient, per MD Hilty with CARDs RN should give torsemide and hold entresto. Family med is aware of the above.

## 2019-07-21 NOTE — Progress Notes (Signed)
Patient or surrogate decision maker refused CPAP HS as ordered by the provider. The risks, benefits and any alternative device/therapy (if applicable) was discussed with the patient or surrogate decision-maker. Patient appeared or surrogate decision maker appeared to comprehend the decision.  

## 2019-07-21 NOTE — Progress Notes (Signed)
Family Medicine Teaching Service Daily Progress Note Intern Pager: [phone]  Patient name: Charles Mckinney Medical record number: [id number] Date of birth: [birth year] Age: 68 y.o. Gender: male  Primary Care Provider: Josiah Lobo, MD (Inactive) Consultants: Cardiology, PT/OT Code Status: Full code   Assessment and Plan: Charles Mckinney is 68 year old male with acute on chronic heart failure with reduced ejection fraction exacerbation with hypotension and bradycardia.  Acute on HF Exacerbation-improving Patient reports improved SOB. Patient prefers to have nasal canula on while sleeping upright in the recliner but not hypoxic to require it.  Patient has a urine output of 1.4 L on 8/15.  Patient is approaching estimated dry weight of 330 pounds at 339 pounds today on 8/15.  On admission, patient weighed 354 pounds Creatinine has been ~1.5-1.6 and 8/16 AM labs are pending. -Discontinue IV Lasix -Start torsemide 40 mg twice daily, will monitor for response to choose home oral dose (transition from Lasix to torsemide) -can start spironolactone if BP tolerates it  Productive cough:resolved Patient continues to have productive cough.  Patient denies hemoptysis.  Patient remains afebrile.  Chest x-ray was completed showing no changes from chest x-ray on admission. -Continue to monitor for signs of fever or worsening cough.  CKD stage 3a-b: stable during this admission while diuresing.    Atrial fibrillation:rate controlled Patient previously on digoxin and amiodarone, these have since been discontinued.  Atrial fibrillation well controlled. -Cardiology following -Continue Eliquis 5 mg twice daily  Hypokalemia: resolved/stable.  K 3.8 am 8/16 -Continue home potassium 20 mEq -Will replete with 40 additional mEq  Hypothyroidism: Continue Synthroid 50 mg  CKD stage 3: Cr. Stable at 1.5  -continue to monitor with daily BMPs  Hypotension/Bradycardia: stable overnight  8/15 Patient asymptomatic Patient still with low Bps overnight  -Low-dose Coreg and Entresto, with soft blood pressures overnight. -We will continue to monitor vitals   Gout: patient does not report of joint pain at this time  -will continue to monitor for evidence of gout flare   Lower Extremity Venous Stasis: bilateral unna boots maintained   FEN/GI: heart healthy  PPx: Eliquis 5mg  BID   Disposition: expect d/c 8/17-18.  medically improved with continued diuresis and decreased oxygen requirement, planning for discharge to SNF pending acceptance   Subjective:  Patient still wanting to go to SNF, comfortable with transition to PO diuretic.  Feels improved acutely but is nervous about his chronic decline in health.  Objective: Temp:  [98.2 F (36.8 C)-98.6 F (37 C)] 98.2 F (36.8 C) (08/16 0439) Pulse Rate:  [60-79] 75 (08/16 0439) Resp:  [18-19] 18 (08/16 0439) BP: (95-121)/(60-69) 121/69 (08/16 0439) SpO2:  [90 %-98 %] 97 % (08/16 0439)   Physical Exam: General: obese male, NAD in bed, wearing [place] from overnight at 2 L Cardiovascular: irregular, not tachy, no murmur noted Respiratory: rhonchi, no IWB, no wheeze Abdomen: obese, no TTP, bowel sounds present Extremities: bilateral 2+ edema above unna boots, patient describes as baseline  Laboratory: Recent Labs  Lab 07/15/19 1512  WBC 7.5  HGB 12.5*  HCT 39.9  PLT 185   Recent Labs  Lab 07/15/19 1512  07/18/19 0538 07/19/19 0543 07/20/19 0437  NA 142   < > 139 138 140  K 3.9   < > 3.6 3.7 3.4*  CL 106   < > 95* 95* 95*  CO2 26   < > 33* 31 34*  BUN 20   < > 20 21 21   CREATININE 1.49*   < >  1.58* 1.58* 1.54*  CALCIUM 8.7*   < > 8.4* 8.5* 8.6*  PROT 6.0*  --   --   --   --   BILITOT 0.8  --   --   --   --   ALKPHOS 39  --   --   --   --   ALT 26  --   --   --   --   AST 26  --   --   --   --   GLUCOSE 98   < > 96 92 98   < > = values in this interval not displayed.    Imaging/Diagnostic Tests: 07/19/19:  Chest x-ray: Shows no changes since admission with pleural effusions bilaterally and no pulmonary infiltrates, some atelectasis  Sherene Sires, DO 07/21/2019, 4:44 AM PGY-3, [place] Intern pager: [phone], text pages welcome

## 2019-07-21 NOTE — TOC Progression Note (Signed)
Transition of Care [facility]) - Progression Note    Patient Details  Name: Charles Mckinney MRN: [id number] Date of Birth: [birth year]  [facility] CM/SW [place], LCSW Phone Number: 07/21/2019, 8:39 AM  Clinical Narrative:       CSW met with patient bedside yesterday to let him know that CSW had reached out to [facility] as requested and they did not extend a bed offers. CSW reviewed current bed offers again. Patient asked CSW to call son.  CSW called son and he stated that him and the patient decided on [facility]. CSW confirmed decision with patient and he was in agreement.  CSW called Abigail Butts at [employer] and she stated that she has started the authorization with [facility].  CSW will continue to follow for discharge planning.    Expected Discharge Plan: [place] Barriers to Discharge: Continued Medical Work up  Expected Discharge Plan and Services Expected Discharge Plan: [place] In-house Referral: Clinical Social Work                                             Social Determinants of Health (SDOH) Interventions    Readmission Risk Interventions No flowsheet data found.

## 2019-07-21 NOTE — Progress Notes (Addendum)
Progress Note  Patient Name: Charles Mckinney Date of Encounter: 07/21/2019  Primary Cardiologist: Bryan Lemma, MD   Subjective   Net negative another 1.5L overnight. BP remains marginal.  Creatinine stable.  Inpatient Medications    Scheduled Meds: . apixaban  5 mg Oral BID  . carvedilol  3.125 mg Oral BID WC  . fluticasone  1 spray Each Nare Daily  . guaiFENesin  600 mg Oral BID  . levothyroxine  50 mcg Oral Q0600  . mometasone-formoterol  2 puff Inhalation BID  . potassium chloride SA  20 mEq Oral Daily  . pravastatin  40 mg Oral Daily  . sacubitril-valsartan  1 tablet Oral BID  . sodium chloride flush  3 mL Intravenous Q12H  . torsemide  40 mg Oral BID   Continuous Infusions: . sodium chloride     PRN Meds: sodium chloride, acetaminophen, sodium chloride flush   Vital Signs    Vitals:   07/20/19 1942 07/21/19 0439 07/21/19 0708 07/21/19 0842  BP:  121/69  (!) 84/64  Pulse: 79 75  90  Resp: 19 18  18   Temp:  98.2 F (36.8 C)    TempSrc:  Oral    SpO2: 95% 97% (S) (!) 84% (!) 89%  Weight:  (!) 152.7 kg    Height:        Intake/Output Summary (Last 24 hours) at 07/21/2019 0850 Last data filed at 07/21/2019 0811 Gross per 24 hour  Intake 1440 ml  Output 3050 ml  Net -1610 ml   Last 3 Weights 07/21/2019 07/20/2019 07/19/2019  Weight (lbs) 336 lb 11.2 oz 339 lb 14.4 oz 343 lb 6.4 oz  Weight (kg) 152.726 kg 154.178 kg 155.765 kg      Telemetry    Afib with CVR- Personally Reviewed  ECG    N/A  Physical Exam   GEN: No acute distress, awake, morbidly obese.   Neck: Difficult to assess JVP due to neck thickness Cardiac: Irreg Irreg, no murmurs, rubs, or gallops.  Respiratory: clear GI: Soft, obese Ext: 3+ LE edema bilaterally, legs wrapped Neuro:  Nonfocal  Psych: Normal affect   Labs    High Sensitivity Troponin:  No results for input(s): TROPONINIHS in the last 720 hours.    Cardiac EnzymesNo results for input(s): TROPONINI in the last 168  hours. No results for input(s): TROPIPOC in the last 168 hours.   Chemistry Recent Labs  Lab 07/15/19 1512  07/19/19 0543 07/20/19 0437 07/21/19 0441  NA 142   < > 138 140 135  K 3.9   < > 3.7 3.4* 3.8  CL 106   < > 95* 95* 93*  CO2 26   < > 31 34* 29  GLUCOSE 98   < > 92 98 90  BUN 20   < > 21 21 22   CREATININE 1.49*   < > 1.58* 1.54* 1.61*  CALCIUM 8.7*   < > 8.5* 8.6* 8.4*  PROT 6.0*  --   --   --   --   ALBUMIN 3.4*  --   --   --   --   AST 26  --   --   --   --   ALT 26  --   --   --   --   ALKPHOS 39  --   --   --   --   BILITOT 0.8  --   --   --   --   GFRNONAA 48*   < >  44* 46* 43*  GFRAA 55*   < > 51* 53* 50*  ANIONGAP 10   < > 12 11 13    < > = values in this interval not displayed.     Hematology Recent Labs  Lab 07/15/19 1512  WBC 7.5  RBC 3.82*  HGB 12.5*  HCT 39.9  MCV 104.5*  MCH 32.7  MCHC 31.3  RDW 14.3  PLT 185    BNP Recent Labs  Lab 07/15/19 1512  BNP 298.0*     DDimer No results for input(s): DDIMER in the last 168 hours.   Radiology    Dg Chest 2 View  Result Date: 07/19/2019 CLINICAL DATA:  Shortness of breath.  Cough. EXAM: CHEST - 2 VIEW COMPARISON:  July 15, 2019 FINDINGS: There is a left-sided pleural effusion with underlying opacity, likely atelectasis, unchanged. There is a probable tiny right effusion with atelectasis, unchanged. No other changes. IMPRESSION: Bilateral pleural effusions, left greater than right, with underlying atelectasis. No other changes. Electronically Signed   By: Dorise Bullion III M.D   On: 07/19/2019 09:36    Cardiac Studies   N/a  Patient Profile     68 y.o. 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  Assessment & Plan    1. Acute on Chronic combined HF: remains on IV lasix 100 mg BID. I/O  negative 1325 cc yesterday.  Weight down from admit but a little higher than yesterday. Net - 4.6 L.  Still with significant overload on exam.   - BP remains soft on Entresto, he is still significantly volume overloaded - hold am dose today, ok to give diuretic  - recommend change coreg to low dose metoprolol 12.5 mg BID - less likely to contribute to hypotension and will still help with afib rate control  2. Permanent Afib: Had been on dig and amiodarone. Dig was stopped prior to admission. Amiodarone stopped on admission as he has been in afib chronically. Also TSH is now 64 c/w hypothyroidism. Heart rate controlled.  BB dose reduced yesterday due to bradycardia. Afib rate appears well controlled on monitor.  -- on Eliquis  5 mg bid dose. - change coreg to metoprolol for rate control  3. CKD: Creatinine stable with diuresis.  4. Hypothyroidism: TSH noted at 81. Amiodarone has been stopped. Started on synthroid  per primary, however, suspect he will need a higher dose given body weight and significant TSH elevation in the 70's - consider 75-100 mcg daily.  5. Hypokalemia - 3.8 today  For questions or updates, please contact [place] Please consult [email or web address] for contact info under   Pixie Casino, MD, FACC, [place] Director of the [facility] Diplomate of the American Board of Clinical Lipidology Attending Cardiologist  Direct Dial: [phone]  Fax: [phone]  Website:  [email or web address]  Pixie Casino, MD  07/21/2019, 8:50 AM

## 2019-07-21 NOTE — Progress Notes (Signed)
Pt refusing CPAP for the night.  

## 2019-07-22 LAB — BASIC METABOLIC PANEL
Anion gap: 10 (ref 5–15)
BUN: 23 mg/dL (ref 8–23)
CO2: 35 mmol/L — ABNORMAL HIGH (ref 22–32)
Calcium: 8.9 mg/dL (ref 8.9–10.3)
Chloride: 93 mmol/L — ABNORMAL LOW (ref 98–111)
Creatinine, Ser: 1.59 mg/dL — ABNORMAL HIGH (ref 0.61–1.24)
GFR calc Af Amer: 51 mL/min — ABNORMAL LOW (ref >60)
GFR calc non Af Amer: 44 mL/min — ABNORMAL LOW (ref >60)
Glucose, Bld: 146 mg/dL — ABNORMAL HIGH (ref 70–99)
Potassium: 3.4 mmol/L — ABNORMAL LOW (ref 3.5–5.1)
Sodium: 138 mmol/L (ref 135–145)

## 2019-07-22 NOTE — Progress Notes (Addendum)
Progress Note  Patient Name: Charles Mckinney Date of Encounter: 07/22/2019  Primary Cardiologist: Bryan Lemmaavid Harding, MD   Subjective   Patient is feeling some general fatigue this morning. Patient still urinating well. - 1.7 L since yesterday. Says he took off his O2 this morning because his nose was burning. Shortness of breath improved. At baseline patient does not use O2.   Inpatient Medications    Scheduled Meds: . apixaban  5 mg Oral BID  . fluticasone  1 spray Each Nare Daily  . guaiFENesin  600 mg Oral BID  . levothyroxine  50 mcg Oral Q0600  . metoprolol tartrate  12.5 mg Oral BID  . mometasone-formoterol  2 puff Inhalation BID  . potassium chloride SA  20 mEq Oral Daily  . pravastatin  40 mg Oral Daily  . sacubitril-valsartan  1 tablet Oral BID  . sodium chloride flush  3 mL Intravenous Q12H   Continuous Infusions: . sodium chloride    . furosemide 100 mg (07/22/19 0910)   PRN Meds: sodium chloride, acetaminophen, sodium chloride flush   Vital Signs    Vitals:   07/21/19 1944 07/22/19 0627 07/22/19 0629 07/22/19 0856  BP:  105/65  110/65  Pulse:  (!) 41  60  Resp:  20    Temp:  98.1 F (36.7 C)    TempSrc:  Oral    SpO2: 96% 94%    Weight:   (!) 151.7 kg   Height:        Intake/Output Summary (Last 24 hours) at 07/22/2019 0930 Last data filed at 07/22/2019 0905 Gross per 24 hour  Intake 1320 ml  Output 2351 ml  Net -1031 ml   Last 3 Weights 07/22/2019 07/21/2019 07/20/2019  Weight (lbs) 334 lb 6.4 oz 336 lb 11.2 oz 339 lb 14.4 oz  Weight (kg) 151.683 kg 152.726 kg 154.178 kg      Telemetry    Afib, rates 70-80s- Personally Reviewed  ECG    No new - Personally Reviewed  Physical Exam   GEN: No acute distress.   Neck:  JVD difficult to assess due to body habitus Cardiac: RRR, no murmurs, rubs, or gallops.  Respiratory: Clear to auscultation bilaterally; 2L O2 (PRN, patient taking it on and off as needed) GI: Soft, nontender, non-distended   MS: 2+ edema with compression stockings; No deformity. Neuro:  Nonfocal  Psych: Normal affect   Labs    High Sensitivity Troponin:  No results for input(s): TROPONINIHS in the last 720 hours.    Cardiac EnzymesNo results for input(s): TROPONINI in the last 168 hours. No results for input(s): TROPIPOC in the last 168 hours.   Chemistry Recent Labs  Lab 07/15/19 1512  07/19/19 0543 07/20/19 0437 07/21/19 0441  NA 142   < > 138 140 135  K 3.9   < > 3.7 3.4* 3.8  CL 106   < > 95* 95* 93*  CO2 26   < > 31 34* 29  GLUCOSE 98   < > 92 98 90  BUN 20   < > 21 21 22   CREATININE 1.49*   < > 1.58* 1.54* 1.61*  CALCIUM 8.7*   < > 8.5* 8.6* 8.4*  PROT 6.0*  --   --   --   --   ALBUMIN 3.4*  --   --   --   --   AST 26  --   --   --   --   ALT 26  --   --   --   --  ALKPHOS 39  --   --   --   --   BILITOT 0.8  --   --   --   --   GFRNONAA 48*   < > 44* 46* 43*  GFRAA 55*   < > 51* 53* 50*  ANIONGAP 10   < > 12 11 13    < > = values in this interval not displayed.     Hematology Recent Labs  Lab 07/15/19 1512  WBC 7.5  RBC 3.82*  HGB 12.5*  HCT 39.9  MCV 104.5*  MCH 32.7  MCHC 31.3  RDW 14.3  PLT 185    BNP Recent Labs  Lab 07/15/19 1512  BNP 298.0*     DDimer No results for input(s): DDIMER in the last 168 hours.   Radiology    No results found.  Cardiac Studies   Echo 2019 Study Conclusions - Procedure narrative: Technically difficult study with poor echo   windows. Definity contrast not given. - Left ventricle: The cavity size was normal. Wall thickness was   increased in a pattern of mild LVH. Systolic function was   moderately to severely reduced. The estimated ejection fraction   was in the range of 30% to 35%. Diffuse hypokinesis. The study is   not technically sufficient to allow evaluation of LV diastolic   function. - Mitral valve: Mildly thickened leaflets . There was mild   regurgitation. - Left atrium: The atrium was normal in size. -  Atrial septum: No defect or patent foramen ovale was identified. - Inferior vena cava: The vessel was normal in size. The   respirophasic diameter changes were in the normal range (>= 50%),   consistent with normal central venous pressure.  Patient Profile     68 y.o. 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  Assessment & Plan    Acute on Chronic combined CHF - On IV Lasix 100mg  BID - Volume status -9 L since admission, -1.7 L in the last 24 hours.  - Weight down 20 lbs since admission. 354 > 334 lbs. Patient says his baseline weight is 330 lbs.  - Patient has been on 2L O2, but took it off this morning and is feeling OK. At baseline patient does not use O2. Shortness of breath improving.  - Creatinine appears stable. AM labs pending - BPs were soft. Entresto AM dose was held yesterday. Coreg was changed to Metoprolol 12.5 BID.  - Appears to be fluid overloaded, but difficult to fully assess with compression wraps. Likely need to be changed.  - BP this am 110/65.  - Continue Entresto - Continue diuresis - Monitor BP  Permanent Afib - Had been on dig and amiodarone before. Dig was stopped prior to admission. Amiodarone was stopped on admission. - TSH was found to be 81 - Heart rates have been controlled, 60-70s. Coreg was changed to metorporlol 12.5 BID yesterday for bradycardia.  - Continue Eliquis 5mg  BID  CKD - Creatinine 1.54 > 1.61 > pending - Appears to be around baseline  Hypothyroidism - TSH 81. Amio was stopped at admission - Patient was started on Synthroid - per IM  Hypokalemia - 3.8 yesterday and was placed on Potassium daily - AM labs pending  Hyperlipidemia - Continue Pravastatin 40 mg  daily  For questions or updates, please contact [place]  Please consult [email or web address] for contact info under        Signed, Cadence Ninfa Meeker, PA-C  07/22/2019, 9:30 AM    I have personally seen and examined this patient. I agree with the assessment and plan as outlined above.  He is diuresing well but still appears volume overloaded. Would continue Lasix 100 mg IV BID today. HR controlled. Renal function pending. I would anticipate that he would be ready for d/c to a SNF by tomorrow or Wednesday.   Lauree Chandler 07/22/2019 10:23 AM

## 2019-07-22 NOTE — Progress Notes (Signed)
Patient swabed for COVID for d/c to SNF

## 2019-07-22 NOTE — Progress Notes (Signed)
Patient refused CPAP HS. Patient states he does not wear one at home.  

## 2019-07-22 NOTE — Progress Notes (Signed)
Orthopedic Tech Progress Note Patient Details:  Charles Mckinney 1951/05/15 [id number] Washed legs and applied lotion before adding new unna boots Ortho Devices Type of Ortho Device: Haematologist Ortho Device/Splint Location: bilateral Ortho Device/Splint Interventions: Adjustment, Application, Ordered   Post Interventions Patient Tolerated: Well Instructions Provided: Care of device, Adjustment of device   Janit Pagan 07/22/2019, 3:15 PM

## 2019-07-22 NOTE — Progress Notes (Signed)
Family Medicine Teaching Service Daily Progress Note Intern Pager: [phone]  Patient name: Charles Mckinney Medical record number: [id number] Date of birth: [birth year] Age: 68 y.o. Gender: male  Primary Care Provider: Josiah Lobo, MD (Inactive) Consultants: Cardiology, PT/OT Code Status: Full code   Assessment and Plan: Charles Mckinney is 68 year old male with acute on chronic heart failure with reduced ejection fraction exacerbation with hypotension and bradycardia.  Patient is medically stable and is nearing discharge.  Likely discharge in 1 to 2 days.  Acute on HF Exacerbation-improving He is doing well on room air.  Breathing and talking normal.  Weight loss of 1 kg overnight.  Total output yesterday was 2.8 L with cumulative output of 10 L since admission.  Cardiology on board.  We will continue to diurese with likely discharge in 1 to 2 days. -Continue IV Lasix -Continue metoprolol  Atrial fibrillation:rate controlled Patient previously on digoxin and amiodarone, these have since been discontinued.  No episodes of RVR in the last 24 hours.  Patient has continued to be in A. fib with evidence of bundle branch block on telemetry.  Pulse is ranging from 41-78 in the last 24 hours. -Cardiology following -Continue Eliquis 5 mg twice daily -Continue metoprolol for rate control  Hypotension/Bradycardia [name] admission has been significant for episodes of hypotension and bradycardia intermittently.  We have intermittently changed cardiac medications prior to dosing.  At this time, have discharge continued Coreg and Entresto.  Continue metoprolol at 12.5 mg.   Overnight, patient continued to have stable blood pressures in the 100s over 60s to 70s. No episodes of bradycardia overnight. -Appreciate cardiology recommendations -Continue 12.5 metoprolol -Continue vitals per unit protocol.    Hypokalemia, acute on chronic [name] potassium is 3.4 this morning. -Continue home  potassium 20 mEq -Will replete with 40 additional mEq  Hypothyroidism: Continue Synthroid 50 mg  CKD stage 3: Cr. Stable at 1.5  -continue to monitor with daily BMPs   CKD stage 3a-b: stable during this admission while diuresing.   -Avoid nephrotoxic medications -Fluid restriction  Lower Extremity Venous Stasis - bilateral unna boots maintained   FEN/GI: heart healthy  PPx: Eliquis 5mg  BID   Disposition: expect d/c 8/18-19.  medically improved with continued diuresis and decreased oxygen requirement, planning for discharge to SNF.   Subjective:  States he is doing okay, could be better.  Wants his son to bring his regular close to the hospital before he has to move to the SNF facility. Today he is also complaining of not having adequate padding when sitting up in the chair.  Objective: Temp:  [98.1 F (36.7 C)-98.7 F (37.1 C)] 98.1 F (36.7 C) (08/17 0627) Pulse Rate:  [41-90] 41 (08/17 0627) Resp:  [18-20] 20 (08/17 0627) BP: (84-114)/(57-66) 105/65 (08/17 0627) SpO2:  [84 %-96 %] 94 % (08/17 0627) Weight:  [151.7 kg] 151.7 kg (08/17 0629)  Physical Exam: General: Appears well, no acute distress. Age appropriate. Cardiac: RRR, normal heart sounds, no murmurs Respiratory: CTAB, normal effort.  Audible rub with exhalation. Abdomen: soft, nontender, nondistended Extremities: Lower extremity edema bilaterally.  No cyanosis.  Distal pulses are intact Skin: Warm and dry, no rashes noted Neuro: alert and oriented, no focal deficits Psych: normal affect  Laboratory: Recent Labs  Lab 07/15/19 1512  WBC 7.5  HGB 12.5*  HCT 39.9  PLT 185   Recent Labs  Lab 07/15/19 1512  07/19/19 0543 07/20/19 0437 07/21/19 0441  NA 142   < > 138  140 135  K 3.9   < > 3.7 3.4* 3.8  CL 106   < > 95* 95* 93*  CO2 26   < > 31 34* 29  BUN 20   < > 21 21 22   CREATININE 1.49*   < > 1.58* 1.54* 1.61*  CALCIUM 8.7*   < > 8.5* 8.6* 8.4*  PROT 6.0*  --   --   --   --   BILITOT 0.8  --    --   --   --   ALKPHOS 39  --   --   --   --   ALT 26  --   --   --   --   AST 26  --   --   --   --   GLUCOSE 98   < > 92 98 90   < > = values in this interval not displayed.    Imaging/Diagnostic Tests: 07/19/19: Chest x-ray: Shows no changes since admission with pleural effusions bilaterally and no pulmonary infiltrates, some atelectasis  Autry-Lott, Keviana Guida, DO 07/22/2019, 6:40 AM PGY-1, [place] Intern pager: [phone], text pages welcome

## 2019-07-22 NOTE — Care Management Important Message (Signed)
Important Message  Patient Details  Name: CONOR LATA MRN: [id number] Date of Birth: 10-06-1951   Medicare Important Message Given:  Yes     Shelda Altes 07/22/2019, 1:03 PM

## 2019-07-22 NOTE — Progress Notes (Signed)
Pt c/o numbness and tingling in feet associated with unna boots. On call provider notified and Unna boots removed. Pt states numbness and tingling has subsided. Will continue to monitor.

## 2019-07-23 LAB — NOVEL CORONAVIRUS, NAA (HOSP ORDER, SEND-OUT TO REF LAB; TAT 18-24 HRS): SARS-CoV-2, NAA: NOT DETECTED

## 2019-07-23 LAB — BASIC METABOLIC PANEL
Anion gap: 13 (ref 5–15)
BUN: 24 mg/dL — ABNORMAL HIGH (ref 8–23)
CO2: 29 mmol/L (ref 22–32)
Calcium: 8.6 mg/dL — ABNORMAL LOW (ref 8.9–10.3)
Chloride: 95 mmol/L — ABNORMAL LOW (ref 98–111)
Creatinine, Ser: 1.57 mg/dL — ABNORMAL HIGH (ref 0.61–1.24)
GFR calc Af Amer: 52 mL/min — ABNORMAL LOW (ref >60)
GFR calc non Af Amer: 45 mL/min — ABNORMAL LOW (ref >60)
Glucose, Bld: 99 mg/dL (ref 70–99)
Potassium: 3.6 mmol/L (ref 3.5–5.1)
Sodium: 137 mmol/L (ref 135–145)

## 2019-07-23 LAB — MAGNESIUM: Magnesium: 2.4 mg/dL (ref 1.7–2.4)

## 2019-07-23 NOTE — Plan of Care (Signed)

## 2019-07-23 NOTE — Progress Notes (Addendum)
Family Medicine Teaching Service Daily Progress Note Intern Pager: [phone]  Patient name: Charles Mckinney Medical record number: [id number] Date of birth: 27-Jun-1951 Age: 68 y.o. Gender: male  Primary Care Provider: Tsosie Billing, MD (Inactive) Consultants: Cardiology, PT/OT Code Status: Full code   Assessment and Plan: Charles Mckinney is 68 year old male with acute on chronic heart failure with reduced ejection fraction exacerbation with hypotension and bradycardia.  Patient is medically stable and is nearing discharge.  Likely discharge in 1 to 2 days.  Acute on HF Exacerbation-improving He is doing well on room air.  Breathing and talking normal.  Weight loss 3.3 kg overnight.  Total output yesterday was 2.4 L.  Cardiology on board.  We will continue to diurese with likely discharge in 1 to 2 days. -Continue IV Lasix -Continue metoprolol  Atrial fibrillation:rate controlled Patient previously on digoxin and amiodarone, these have since been discontinued.  No episodes of RVR in the last 24 hours.  Patient has continued to be in A. fib with evidence of bundle branch block on telemetry.  Pulse is ranging from 70-76 in the last 24 hours. -Cardiology following -Continue Eliquis 5 mg twice daily -Continue metoprolol for rate control  Hypotension/Bradycardia [name] admission has been significant for episodes of hypotension and bradycardia intermittently.  We have intermittently changed cardiac medications prior to dosing.  At this time, have discharge continued Coreg and Entresto.  Continue metoprolol at 12.5 mg.   Overnight, patient continued to have stable blood pressures in the 100s over 60s. No episodes of bradycardia overnight. -Appreciate cardiology recommendations -Continue 12.5 metoprolol -Continue vitals per unit protocol.    Hypokalemia, acute on chronic [name] potassium is 3.6 this morning. -Continue home potassium 20 mEq -Will replete with 40 additional mEq  if necessary  Hypothyroidism: Continue Synthroid 50 mg  CKD stage 3: Cr. Stable at 1.5  -continue to monitor with daily BMPs   CKD stage 3a-b: stable during this admission while diuresing.   -Avoid nephrotoxic medications -Fluid restriction  Lower Extremity Venous Stasis - bilateral unna boots taken off palpation at night due to tightness  FEN/GI: heart healthy  PPx: Eliquis 5mg  BID   Disposition: expect d/c 8/19.  medically improved with continued diuresis and decreased oxygen requirement, planning for discharge to SNF.   Subjective:  He is doing well. Up in his recliner. Today he states he is doing well and ready to go to the SNF facility. He states his right hip is sore from sitting in the chair. Pillow offered for more cushion.   Objective: Temp:  [97.9 F (36.6 C)-98.7 F (37.1 C)] 97.9 F (36.6 C) (08/18 0518) Pulse Rate:  [47-99] 73 (08/18 0518) Resp:  [18-19] 18 (08/18 0518) BP: (98-110)/(58-78) 101/64 (08/18 0518) SpO2:  [90 %-93 %] 91 % (08/17 2050) Weight:  [148.4 kg] 148.4 kg (08/18 0359)  Physical Exam: General: Appears well, no acute distress. Age appropriate. Cardiac: RRR, distant heart sounds, no murmurs Respiratory: CTAB, normal effort Abdomen: soft, nontender, nondistended Extremities: No edema or cyanosis. Skin: Warm and dry, no rashes noted Neuro: alert and oriented, no focal deficits Psych: normal affect   Laboratory: No results for input(s): WBC, HGB, HCT, PLT in the last 168 hours. Recent Labs  Lab 07/21/19 0441 07/22/19 1001 07/23/19 0447  NA 135 138 137  K 3.8 3.4* 3.6  CL 93* 93* 95*  CO2 29 35* 29  BUN 22 23 24*  CREATININE 1.61* 1.59* 1.57*  CALCIUM 8.4* 8.9 8.6*  GLUCOSE  90 146* 99    Imaging/Diagnostic Tests: 07/19/19: Chest x-ray: Shows no changes since admission with pleural effusions bilaterally and no pulmonary infiltrates, some atelectasis  Autry-Lott, Charles Szymborski, DO 07/23/2019, 6:43 AM PGY-1, [facility] Intern pager: [phone], text pages welcome

## 2019-07-23 NOTE — Progress Notes (Addendum)
Progress Note  Patient Name: Charles Mckinney Date of Encounter: 07/23/2019  Primary Cardiologist: Bryan Lemma, MD   Subjective   Patient says breathing has improved. He is using O2 as needed. Still diuresing well. Patient removed his compression due to tightness.   Inpatient Medications    Scheduled Meds: . apixaban  5 mg Oral BID  . fluticasone  1 spray Each Nare Daily  . guaiFENesin  600 mg Oral BID  . levothyroxine  50 mcg Oral Q0600  . metoprolol tartrate  12.5 mg Oral BID  . mometasone-formoterol  2 puff Inhalation BID  . potassium chloride SA  20 mEq Oral Daily  . pravastatin  40 mg Oral Daily  . sacubitril-valsartan  1 tablet Oral BID  . sodium chloride flush  3 mL Intravenous Q12H   Continuous Infusions: . sodium chloride    . furosemide 100 mg (07/23/19 0946)   PRN Meds: sodium chloride, acetaminophen, sodium chloride flush   Vital Signs    Vitals:   07/23/19 0359 07/23/19 0518 07/23/19 0716 07/23/19 0900  BP:  101/64  100/69  Pulse:  73  75  Resp:  18    Temp:  97.9 F (36.6 C)    TempSrc:  Oral    SpO2:   91%   Weight: (!) 148.4 kg     Height:        Intake/Output Summary (Last 24 hours) at 07/23/2019 1010 Last data filed at 07/23/2019 0912 Gross per 24 hour  Intake 1490 ml  Output 2525 ml  Net -1035 ml   Last 3 Weights 07/23/2019 07/22/2019 07/21/2019  Weight (lbs) 327 lb 3.2 oz 334 lb 6.4 oz 336 lb 11.2 oz  Weight (kg) 148.417 kg 151.683 kg 152.726 kg      Telemetry    Afib, rates 70-80s, occasional PVCs - Personally Reviewed  ECG    No new - Personally Reviewed  Physical Exam   GEN: No acute distress.   Neck: No JVD Cardiac: Irreg Irreg, no murmurs, rubs, or gallops.  Respiratory: Clear to auscultation bilaterally. GI: Soft, nontender, non-distended  MS: 2+ bilateral edema; No deformity. Neuro:  Nonfocal  Psych: Normal affect   Labs    High Sensitivity Troponin:  No results for input(s): TROPONINIHS in the last 720 hours.     Cardiac EnzymesNo results for input(s): TROPONINI in the last 168 hours. No results for input(s): TROPIPOC in the last 168 hours.   Chemistry Recent Labs  Lab 07/21/19 0441 07/22/19 1001 07/23/19 0447  NA 135 138 137  K 3.8 3.4* 3.6  CL 93* 93* 95*  CO2 29 35* 29  GLUCOSE 90 146* 99  BUN 22 23 24*  CREATININE 1.61* 1.59* 1.57*  CALCIUM 8.4* 8.9 8.6*  GFRNONAA 43* 44* 45*  GFRAA 50* 51* 52*  ANIONGAP 13 10 13      HematologyNo results for input(s): WBC, RBC, HGB, HCT, MCV, MCH, MCHC, RDW, PLT in the last 168 hours.  BNPNo results for input(s): BNP, PROBNP in the last 168 hours.   DDimer No results for input(s): DDIMER in the last 168 hours.   Radiology    No results found.  Cardiac Studies   Echo 2019 Study Conclusions - Procedure narrative: Technically difficult study with poor echo windows. Definity contrast not given. - Left ventricle: The cavity size was normal. Wall thickness was increased in a pattern of mild LVH. Systolic function was moderately to severely reduced. The estimated ejection fraction was in the range of  30% to 35%. Diffuse hypokinesis. The study is not technically sufficient to allow evaluation of LV diastolic function. - Mitral valve: Mildly thickened leaflets . There was mild regurgitation. - Left atrium: The atrium was normal in size. - Atrial septum: No defect or patent foramen ovale was identified. - Inferior vena cava: The vessel was normal in size. The respirophasic diameter changes were in the normal range (>= 50%), consistent with normal central venous pressure.  Patient Profile     68 y.o. 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   Assessment & Plan    Acute on Chronic combined CHF - On IV Lasix 100mg  BID - Volume status -10.6  L since admission,-2.4 L in the last 24 hours.  - Weight down >20 lbs since admission. 354 > 327 lbs. Patient says his baseline weight is around 330 lbs.  -Shortness of breath improving. Using O2 as needed - Creatinine appears stable. 1.59 > 1.57 - BPs continue to be soft. Coreg had been changed to Metoprolol 12.5 BID. Entresto and Lopressor were both given this AM. BP this am 100/69.  - Continue Entresto and BB for now and continue to monitor. Can hold Entresto if pressures get any lower. - Patient is still fluid overloaded. Compression wraps were removed yesterday due to tightness. - Recommend continue diuresis  Permanent Afib - Had been on dig and amiodarone before. Dig was stopped prior to admission. Amiodarone was stopped on admission. - TSH was found to be 81 - Heart rates have been controlled,70-80s. Coreg was changed to metorporlol 12.5 BID yesterday for bradycardia.  - Continue Eliquis 5mg  BID  CKD - Creatinine stable, 1.59  > 1.57  - Appears to be around baseline  Hypothyroidism - TSH 81. Amio was stopped at admission - Patient was started on Synthroid 64mcg - per IM  Hypokalemia - 3.8 yesterday and was placed on Potassium daily - AM labs pending  Hyperlipidemia - Continue Pravastatin 40 mg daily  For questions or updates, please contact [facility] Please consult [email or web address] for contact info under     Signed, Cadence Ninfa Meeker, PA-C  07/23/2019, 10:10 AM    Patient seen, examined. Available data reviewed. Agree with findings, assessment, and plan as outlined by Cadence Kathlen Mody, PA-C. No CP or shortness of breath. Uncomfortable - wants another pillow and the entire unit is out of pillows. On my exam, he is in NAD, sitting in chair at bedside. HEENT: nl, JVP moderately elevated, lungs CTA, abdomen soft, obese. Extremities with 2+ BL pretibial edema.   He is diuresing well, but BP is soft. Would hold entresto to allow for continued IV diuresis, then restart at  discharge. He is still volume overloaded. Continue lasix 100 mg IV BID. Follow daily lytes/renal function.  Sherren Mocha, M.D. 07/23/2019 11:06 AM

## 2019-07-23 NOTE — Progress Notes (Signed)
Patient refuses CPAP.  Does not wear one at home.

## 2019-07-23 NOTE — Progress Notes (Signed)
NCM will continue to monitor for TOC needs.  

## 2019-07-24 LAB — BASIC METABOLIC PANEL
Anion gap: 14 (ref 5–15)
BUN: 26 mg/dL — ABNORMAL HIGH (ref 8–23)
CO2: 30 mmol/L (ref 22–32)
Calcium: 8.8 mg/dL — ABNORMAL LOW (ref 8.9–10.3)
Chloride: 95 mmol/L — ABNORMAL LOW (ref 98–111)
Creatinine, Ser: 1.58 mg/dL — ABNORMAL HIGH (ref 0.61–1.24)
GFR calc Af Amer: 51 mL/min — ABNORMAL LOW (ref >60)
GFR calc non Af Amer: 44 mL/min — ABNORMAL LOW (ref >60)
Glucose, Bld: 96 mg/dL (ref 70–99)
Potassium: 3.3 mmol/L — ABNORMAL LOW (ref 3.5–5.1)
Sodium: 139 mmol/L (ref 135–145)

## 2019-07-24 MED ORDER — POTASSIUM CHLORIDE CRYS ER 20 MEQ PO TBCR
40.0000 meq | EXTENDED_RELEASE_TABLET | Freq: Every day | ORAL | Status: DC
  Administered 2019-07-25: 40 meq via ORAL
  Filled 2019-07-24 (×2): qty 2

## 2019-07-24 NOTE — Progress Notes (Addendum)
Patient has bed at [facility], they confirm avail tomorrow 8/20 if patient is medically ready. They have started insurance auth. CSW has reached out to MD to inquire as to if patient will potentially be medically ready to dc to [facility] tomorrow 8/20.   Update: plans for patient to dc to [facility] tomorrow. [name] son Tayshun informed, he will complete admissions paperwork for [facility] at 10:00 am tomorrow 8/20 prior to patient's arrival at facility.   [place], [place]

## 2019-07-24 NOTE — Progress Notes (Signed)
Upon RT morning rounds patient's SAT['s were 79% on room air. RT attempted to place patient on O2 via nasal cannula. Patient adamantly refused to be placed on nasal cannula. Patient states "I do not wear oxygen at home & I will not wear it when I go to the nursing home" This RT took time with the patient & tried to explain the importance of wearing his oxygen & patient still refused. RT will make RN aware. Nasal cannula is set at 3L & is at the bedside within patient reach.

## 2019-07-24 NOTE — Progress Notes (Signed)
Physical Therapy Treatment Patient Details Name: Charles Mckinney MRN: [id number] DOB: 1951/04/10 Today's Date: 07/24/2019    History of Present Illness 68 y.o. admitted with SOB and CHF exacerbation. PMH is significant for CAD s/p CABG x4, HTN, HLD, GERD, A fib on eliquis, HFrEF (EF 30-35%), gout, OSA    PT Comments    Patient received sitting up in recliner. Reports his right hip hurts from lying on his side in bed. Patient agrees to PT session. On 3 lpm O2 via [place] with sats at 95%. Patient is able to perform sit to stand transfer with supervision. Cues for hand placement. Ambulated 20 feet with 4 wheeled walker, min guard assist. Occasional unsteadiness noted, but no overt LOB. Patient performed seated LE strengthening exercises. He will benefit from continued skilled PT while here to improve strength, safety and functional independence.     Follow Up Recommendations  SNF;Supervision/Assistance - 24 hour     Equipment Recommendations  Wheelchair cushion (measurements PT);Wheelchair (measurements PT)    Recommendations for Other Services       Precautions / Restrictions Restrictions Weight Bearing Restrictions: No    Mobility  Bed Mobility               General bed mobility comments: not assessed  Transfers Overall transfer level: Needs assistance Equipment used: Rolling walker (2 wheeled) Transfers: Sit to/from Stand Sit to Stand: Modified independent (Device/Increase time);Supervision         General transfer comment: guarding for lines and safety, cues for hand placement.  Ambulation/Gait Ambulation/Gait assistance: Min guard Gait Distance (Feet): 20 Feet Assistive device: 4-wheeled walker Gait Pattern/deviations: Step-to pattern;Decreased step length - right;Decreased step length - left;Decreased stride length;Trunk flexed;Wide base of support Gait velocity: decreased   General Gait Details: unsteady at times   Stairs             Wheelchair  Mobility    Modified Rankin (Stroke Patients Only)       Balance Overall balance assessment: Modified Independent;Needs assistance Sitting-balance support: Feet supported Sitting balance-Leahy Scale: Fair     Standing balance support: Bilateral upper extremity supported Standing balance-Leahy Scale: Fair                              Cognition Arousal/Alertness: Awake/alert Behavior During Therapy: Flat affect Overall Cognitive Status: No family/caregiver present to determine baseline cognitive functioning Area of Impairment: Safety/judgement                         Safety/Judgement: Decreased awareness of safety   Problem Solving: Slow processing;Requires verbal cues        Exercises Other Exercises Other Exercises: seated B LE exercises: LAQ, marching, toe taps x 10 reps each    General Comments        Pertinent Vitals/Pain Pain Assessment: Faces Faces Pain Scale: Hurts little more Pain Location: right hip Pain Descriptors / Indicators: Aching;Sore Pain Intervention(s): Monitored during session    Home Living Family/patient expects to be discharged to:: Skilled nursing facility Living Arrangements: Alone Available Help at Discharge: Available PRN/intermittently;Family Type of Home: House Home Access: Stairs to enter;Ramped entrance Entrance Stairs-Rails: Right;Left Home Layout: One level Home Equipment: Environmental consultant - 4 wheels;Cane - single point;Hospital bed      Prior Function Level of Independence: Independent with assistive device(s);Needs assistance  Gait / Transfers Assistance Needed: walks up 77' with cane or rollator ADL's / Homemaking  Assistance Needed: has trouble washing  (sink baths on toilet), cleaning after toileting). pt prepares quick prep food like sandwiches Comments: ex wife brings meals and cleans on occasion   PT Goals (current goals can now be found in the care plan section) Acute Rehab PT Goals Patient Stated Goal:  to go to rehab PT Goal Formulation: With patient Time For Goal Achievement: 07/30/19 Potential to Achieve Goals: Good Progress towards PT goals: Progressing toward goals    Frequency    Min 2X/week      PT Plan Current plan remains appropriate    Co-evaluation              AM-PAC PT "6 Clicks" Mobility   Outcome Measure  Help needed turning from your back to your side while in a flat bed without using bedrails?: A Little Help needed moving from lying on your back to sitting on the side of a flat bed without using bedrails?: A Little Help needed moving to and from a bed to a chair (including a wheelchair)?: A Little Help needed standing up from a chair using your arms (e.g., wheelchair or bedside chair)?: A Little Help needed to walk in hospital room?: A Little Help needed climbing 3-5 steps with a railing? : A Lot 6 Click Score: 17    End of Session Equipment Utilized During Treatment: Gait belt Activity Tolerance: Patient limited by fatigue;Patient limited by pain Patient left: in chair;with call bell/phone within reach Nurse Communication: Mobility status PT Visit Diagnosis: Unsteadiness on feet (R26.81);Muscle weakness (generalized) (M62.81);Difficulty in walking, not elsewhere classified (R26.2);History of falling (Z91.81);Pain Pain - Right/Left: Right Pain - part of body: Hip     Time: 2774-1287 PT Time Calculation (min) (ACUTE ONLY): 30 min  Charges:  $Gait Training: 8-22 mins $Therapeutic Exercise: 8-22 mins                     Keonta Alsip, PT, GCS 07/24/19,10:28 AM

## 2019-07-24 NOTE — Progress Notes (Signed)
SATURATION QUALIFICATIONS: (This note is used to comply with regulatory documentation for home oxygen)  Patient Saturations on Room Air at Rest = 93%  Patient Saturations on Room Air while Ambulating = 86%  Patient Saturations on 3Liters of oxygen while Ambulating = 95%  Please briefly explain why patient needs home oxygen:

## 2019-07-24 NOTE — Progress Notes (Signed)
BP 88/51 and HR 84. MD paged about Metoprolol this PM. Orders received to hold med tonight. Will continue to monitor.

## 2019-07-24 NOTE — Progress Notes (Addendum)
Family Medicine Teaching Service Daily Progress Note Intern Pager: [phone]  Patient name: Charles Mckinney Medical record number: [id number] Date of birth: Jul 12, 1951 Age: 68 y.o. Gender: male  Primary Care Provider: Tsosie Billing, MD (Inactive) Consultants: Cardiology, PT/OT Code Status: Full code   Assessment and Plan: Charles Mckinney is 68 year old male with acute on chronic heart failure with reduced ejection fraction exacerbation with hypotension and bradycardia.  Patient is medically stable and is nearing discharge.  Likely discharge in 1 to 2 days.  Acute on HF Exacerbation-improving He was 79% on room air really refusing oxygen.  Now breathing and talking normal with 3 L nasal cannula.  Weight loss 1.1 kg overnight.  Total output yesterday was 1.2L.  Cardiology on board.  We will continue to diurese with likely discharge according to cardiology in 1 to 2 days cardiology's recommendation. -Continue IV Lasix -Continue metoprolol - check pulse ox with ambulating  Atrial fibrillation:rate controlled Patient previously on digoxin and amiodarone, these have since been discontinued.  No episodes of RVR in the last 24 hours.  Patient has continued to be in A. fib with evidence of bundle branch block on telemetry.  Pulse is ranging from 69-93 in the last 24 hours. -Cardiology following -Continue Eliquis 5 mg twice daily -Continue metoprolol for rate control  Hypotension/Bradycardia [name] admission has been significant for episodes of hypotension and bradycardia intermittently.  We have intermittently changed cardiac medications prior to dosing.  At this time, have discharge continued Coreg and Entresto.  Continue metoprolol at 12.5 mg.   Overnight, patient continued to have stable blood pressures in the 90s over 60-70s.  As one episode of 140/96.  We will continue to hold Entresto. No episodes of bradycardia overnight. -Appreciate cardiology recommendations -Continue 12.5  metoprolol -Continue vitals per unit protocol.    Hypokalemia, acute on chronic [name] potassium is 3.3 this morning. -Continue home potassium 20 mEq -Will replete with 40 additional mEq if necessary  Hypothyroidism: Continue Synthroid 50 mg  CKD stage 3: Cr. Stable at 1.5  -continue to monitor with daily BMPs   CKD stage 3a-b: stable during this admission while diuresing.   -Avoid nephrotoxic medications -Fluid restriction  Lower Extremity Venous Stasis - bilateral unna boots taken off palpation at night due to tightness  FEN/GI: heart healthy  PPx: Eliquis 5mg  BID   Disposition: expect d/c 8/19.  medically improved with continued diuresis and decreased oxygen requirement, planning for discharge to SNF.   Subjective:  He is doing well. Up in his recliner. Today he states he is doing well and ready to go to the SNF facility but will require oxygen and is afraid there will be none at the facility.  He has been reassured that he should be able to get oxygen.  Objective: Temp:  [97.6 F (36.4 C)-99 F (37.2 C)] 98.6 F (37 C) (08/19 0603) Pulse Rate:  [69-86] 83 (08/19 0603) Resp:  [18-19] 18 (08/19 0603) BP: (91-100)/(64-74) 96/74 (08/19 0603) SpO2:  [91 %-93 %] 91 % (08/19 0603) Weight:  [149.3 kg] 149.3 kg (08/19 0603)  Physical Exam: General: Appears well, no acute distress. Age appropriate. Cardiac: RRR, normal but distant heart sounds, no murmurs Respiratory: Audible rub present bilaterally, normal effort Abdomen: soft, nontender, nondistended Extremities: Bilateral lower leg edema. No cyanosis. Skin: Warm and dry, no rashes noted Neuro: alert and oriented, no focal deficits Psych: normal affect    Laboratory: No results for input(s): WBC, HGB, HCT, PLT in the last 168  hours. Recent Labs  Lab 07/22/19 1001 07/23/19 0447 07/24/19 0442  NA 138 137 139  K 3.4* 3.6 3.3*  CL 93* 95* 95*  CO2 35* 29 30  BUN 23 24* 26*  CREATININE 1.59* 1.57* 1.58*   CALCIUM 8.9 8.6* 8.8*  GLUCOSE 146* 99 96    Imaging/Diagnostic Tests: 07/19/19: Chest x-ray: Shows no changes since admission with pleural effusions bilaterally and no pulmonary infiltrates, some atelectasis  Charles Mckinney, Charles Mcdiarmid, DO 07/24/2019, 6:54 AM PGY-1, [facility] FPTS Intern pager: [phone], text pages welcome

## 2019-07-24 NOTE — Progress Notes (Addendum)
Progress Note  Patient Name: Charles Mckinney Date of Encounter: 07/24/2019  Primary Cardiologist: Glenetta Hew, MD   Subjective   This morning O2 was noted to be 79%. Patient slept with O2 but it came off overnight. Now on 3L O2. Patient feeling OK. Still fluid overloaded.   Inpatient Medications    Scheduled Meds: . apixaban  5 mg Oral BID  . fluticasone  1 spray Each Nare Daily  . guaiFENesin  600 mg Oral BID  . levothyroxine  50 mcg Oral Q0600  . metoprolol tartrate  12.5 mg Oral BID  . mometasone-formoterol  2 puff Inhalation BID  . potassium chloride SA  20 mEq Oral Daily  . pravastatin  40 mg Oral Daily  . sodium chloride flush  3 mL Intravenous Q12H   Continuous Infusions: . sodium chloride    . furosemide 100 mg (07/24/19 0920)   PRN Meds: sodium chloride, acetaminophen, sodium chloride flush   Vital Signs    Vitals:   07/23/19 2149 07/24/19 0603 07/24/19 0731 07/24/19 0858  BP: 91/64 96/74  (!) 140/96  Pulse: 69 83  91  Resp:  18  18  Temp:  98.6 F (37 C)  98.7 F (37.1 C)  TempSrc:  Oral  Oral  SpO2:  91% (S) (!) 79% 96%  Weight:  (!) 149.3 kg    Height:        Intake/Output Summary (Last 24 hours) at 07/24/2019 0929 Last data filed at 07/24/2019 0905 Gross per 24 hour  Intake 660 ml  Output 1150 ml  Net -490 ml   Last 3 Weights 07/24/2019 07/23/2019 07/22/2019  Weight (lbs) 329 lb 3.2 oz 327 lb 3.2 oz 334 lb 6.4 oz  Weight (kg) 149.324 kg 148.417 kg 151.683 kg      Telemetry    Afib, rates 70-80s - Personally Reviewed  ECG    No new - Personally Reviewed  Physical Exam   GEN: No acute distress.   Neck: minimal, but difficult to assess JVD Cardiac: Irreg Irreg, no murmurs, rubs, or gallops.  Respiratory: Clear to auscultation bilaterally; 3L O2 GI: Soft, nontender, non-distended  MS: + edema; No deformity. Neuro:  Nonfocal  Psych: Normal affect   Labs    High Sensitivity Troponin:  No results for input(s): TROPONINIHS in the  last 720 hours.    Cardiac EnzymesNo results for input(s): TROPONINI in the last 168 hours. No results for input(s): TROPIPOC in the last 168 hours.   Chemistry Recent Labs  Lab 07/22/19 1001 07/23/19 0447 07/24/19 0442  NA 138 137 139  K 3.4* 3.6 3.3*  CL 93* 95* 95*  CO2 35* 29 30  GLUCOSE 146* 99 96  BUN 23 24* 26*  CREATININE 1.59* 1.57* 1.58*  CALCIUM 8.9 8.6* 8.8*  GFRNONAA 44* 45* 44*  GFRAA 51* 52* 51*  ANIONGAP 10 13 14      HematologyNo results for input(s): WBC, RBC, HGB, HCT, MCV, MCH, MCHC, RDW, PLT in the last 168 hours.  BNPNo results for input(s): BNP, PROBNP in the last 168 hours.   DDimer No results for input(s): DDIMER in the last 168 hours.   Radiology    No results found.  Cardiac Studies   Echo 2019 Study Conclusions - Procedure narrative: Technically difficult study with poor echo windows. Definity contrast not given. - Left ventricle: The cavity size was normal. Wall thickness was increased in a pattern of mild LVH. Systolic function was moderately to severely reduced. The estimated  ejection fraction was in the range of 30% to 35%. Diffuse hypokinesis. The study is not technically sufficient to allow evaluation of LV diastolic function. - Mitral valve: Mildly thickened leaflets . There was mild regurgitation. - Left atrium: The atrium was normal in size. - Atrial septum: No defect or patent foramen ovale was identified. - Inferior vena cava: The vessel was normal in size. The respirophasic diameter changes were in the normal range (>= 50%), consistent with normal central venous pressure.  Patient Profile     68 y.o. 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   Assessment & Plan    Acute on Chronic combined CHF - On IV Lasix 100mg  BID -  Overnight O2 levels dropped to 79%. Now on 3L O2 - Volume status -11 L since admission,-1.2 L in the last 24 hours.  - Weight down >20 lbs since admission. 354 >329 lbs. Patient says his baseline weight is around 330 lbs.  - Creatinine appears stable. 1.57 > 1.58 - K+ low, will replete - BPs were soft. Sherryll Burger was held yesterday. BP this am 140/96.  - Continue Metoprolol - Patient is still fluid overloaded. Compression wraps were previously removed due to tightness. - Recommend continued diuresis  Permanent Afib - Had been on dig and amiodarone before. Dig was stopped prior to admission. Amiodarone was stopped on admission. - TSH was found to be 81 - Heart rates have been controlled,70-80s - Continue metorporlol 12.5 BID  - Continue Eliquis 5mg  BID  CKD - Creatinine stable, 1.59  > 1.57  - Appears to be around baseline  Hypothyroidism - TSH 81. Amio was stopped at admission - Patient was started on Synthroid - per IM  Hypokalemia - 3.6 > 3.3 patient was placed on K+ 20 mEq daily - Will increase to 40 daily  Hyperlipidemia - Continue Pravastatin 40 mg daily   For questions or updates, please contact [facility] Please consult [email or web address] for contact info under        Signed, Cadence David Stall, PA-C  07/24/2019, 9:29 AM    I have personally seen and examined this patient. I agree with the assessment and plan as outlined above.  He is feeling better this am. Still some dyspnea and LE edema.  He still appears to be volume overloaded.  Vitals reviewed by me.  Labs reviewed by me.  My exam:  General: Well developed, well nourished, NAD  HEENT: OP clear, mucus membranes moist  SKIN: warm, dry. No rashes. Neuro: No focal deficits  Musculoskeletal: Muscle strength 5/5 all ext  Psychiatric: Mood and affect normal  Neck: hard to assess JVD given size.   Lungs:Clear bilaterally, no wheezes, rhonci, crackles Cardiovascular: Irregular irregular. No murmurs,  gallops or rubs. Abdomen:Soft. Bowel sounds present. Non-tender.  Extremities: 1-2+ bilatera lower extremity edema.   I think he is still volume overloaded. Nearing baseline weight. Continue IV Lasix today. Possibly ready for d/c  tomorrow.   Verne Carrow 07/24/2019 10:55 AM

## 2019-07-24 NOTE — Care Management (Signed)
Spoke w [name] CSW for updates on SNFs. [facility] and [facility] cannot offer a bed currently. I presented patient with options of [facility], [facility], and [facility]. He is not happy about any of these options. He continues to ask for [facility] and [facility]. He will call his son and discuss options with him. CM/ CSW will continue to follow.

## 2019-07-24 NOTE — Plan of Care (Signed)

## 2019-07-24 NOTE — Progress Notes (Signed)
RT inform RN that patient O2 sat was 79% on RA and patient refused to use the supplemental O2. RN went to patient room to check on him, patient sitting up on side of bed eating breakfast, RN asked patient if he could please wears the oxygen since his sats were low, but patient also refused stating"[facility], there will be no oxygen there for me to wear and I never wore oxygen at home till I came here." RN tried to explained to patient the reasons we put him on oxygen but patient refused to listen and asked RN to get out of his room so he could eat his breakfast. Will continue to monitor patient.

## 2019-07-25 LAB — BASIC METABOLIC PANEL
Anion gap: 11 (ref 5–15)
BUN: 28 mg/dL — ABNORMAL HIGH (ref 8–23)
CO2: 32 mmol/L (ref 22–32)
Calcium: 9.1 mg/dL (ref 8.9–10.3)
Chloride: 98 mmol/L (ref 98–111)
Creatinine, Ser: 1.51 mg/dL — ABNORMAL HIGH (ref 0.61–1.24)
GFR calc Af Amer: 54 mL/min — ABNORMAL LOW (ref >60)
GFR calc non Af Amer: 47 mL/min — ABNORMAL LOW (ref >60)
Glucose, Bld: 97 mg/dL (ref 70–99)
Potassium: 3.4 mmol/L — ABNORMAL LOW (ref 3.5–5.1)
Sodium: 141 mmol/L (ref 135–145)

## 2019-07-25 MED ORDER — APIXABAN 5 MG PO TABS: 5.0000 mg | ORAL_TABLET | Freq: Two times a day (BID) | ORAL | 0 refills | Status: DC

## 2019-07-25 MED ORDER — FUROSEMIDE 20 MG PO TABS: 60.0000 mg | ORAL_TABLET | Freq: Two times a day (BID) | ORAL | Status: DC

## 2019-07-25 MED ORDER — METOPROLOL TARTRATE 25 MG PO TABS: 12.5000 mg | ORAL_TABLET | Freq: Two times a day (BID) | ORAL | Status: DC

## 2019-07-25 MED ORDER — FUROSEMIDE 10 MG/ML IJ SOLN: 60.0000 mg | Freq: Two times a day (BID) | INTRAMUSCULAR | Status: DC

## 2019-07-25 MED ORDER — LEVOTHYROXINE SODIUM 50 MCG PO TABS: 50.0000 ug | ORAL_TABLET | Freq: Every day | ORAL | Status: DC

## 2019-07-25 NOTE — TOC Transition Note (Signed)
Transition of Care [facility]) - CM/SW Discharge Note   Patient Details  Name: Charles Mckinney MRN: [id number] Date of Birth: [birth year]  [facility]) CM/SW Contact:  Alberteen Sam, LCSW Phone Number: 07/25/2019, 1:20 PM   Clinical Narrative:     Patient will DC to: [facility] Anticipated DC date: 07/25/2019 Family notified:  [name] Transport by: Corey Harold  Per MD patient ready for DC to [facility] . RN, patient, patient's family, and facility notified of DC. Discharge Summary sent to facility. RN given number for report   [phone] Room 3213. DC packet on chart. Ambulance transport requested for patient.  CSW signing off.  [place], [place]    Final next level of care: Skilled Nursing Facility Barriers to Discharge: No Barriers Identified   Patient Goals and CMS Choice Patient states their goals for this hospitalization and ongoing recovery are:: Patient expressed understanding that he needs rehab to help him get stronger before going home because he lives alone CMS Medicare.gov Compare Post Acute Care list provided to:: Patient Represenative (must comment)(Alexio (son)) Choice offered to / list presented to : Adult Children  Discharge Placement PASRR number recieved: 07/19/19            Patient [facility] bed at: [facility] Patient to be transferred to facility by: [facility] Name of family member notified: [place] (son) Patient and family notified of of transfer: 07/25/19  Discharge Plan and Services In-house Referral: Clinical Social Work                                   Social Determinants of Health (SDOH) Interventions     Readmission Risk Interventions No flowsheet data found.

## 2019-07-25 NOTE — Progress Notes (Signed)
Progress Note  Patient Name: Charles Mckinney Date of Encounter: 07/25/2019  Primary Cardiologist: Bryan Lemma, MD   Subjective   No chest pain. Breathing better  Inpatient Medications    Scheduled Meds: . apixaban  5 mg Oral BID  . fluticasone  1 spray Each Nare Daily  . guaiFENesin  600 mg Oral BID  . levothyroxine  50 mcg Oral Q0600  . metoprolol tartrate  12.5 mg Oral BID  . mometasone-formoterol  2 puff Inhalation BID  . potassium chloride SA  40 mEq Oral Daily  . pravastatin  40 mg Oral Daily  . sodium chloride flush  3 mL Intravenous Q12H   Continuous Infusions: . sodium chloride    . furosemide 100 mg (07/24/19 1754)   PRN Meds: sodium chloride, acetaminophen, sodium chloride flush   Vital Signs    Vitals:   07/24/19 2024 07/24/19 2040 07/24/19 2100 07/25/19 0546  BP:  (!) 86/58 (!) 88/51 (!) 89/60  Pulse:  (!) 101 87 69  Resp:  16  18  Temp:  98.4 F (36.9 C)  98.6 F (37 C)  TempSrc:  Oral  Oral  SpO2: 91% 96% 96% 96%  Weight:    (!) 139.6 kg  Height:        Intake/Output Summary (Last 24 hours) at 07/25/2019 0824 Last data filed at 07/25/2019 0752 Gross per 24 hour  Intake 2190 ml  Output 1650 ml  Net 540 ml   Last 3 Weights 07/25/2019 07/24/2019 07/23/2019  Weight (lbs) 307 lb 12.8 oz 329 lb 3.2 oz 327 lb 3.2 oz  Weight (kg) 139.617 kg 149.324 kg 148.417 kg      Telemetry    Atrial fib, rate 90s- Personally Reviewed  ECG    No new - Personally Reviewed  Physical Exam   General: Well developed, well nourished, NAD  HEENT: OP clear, mucus membranes moist  SKIN: warm, dry. No rashes. Neuro: No focal deficits  Musculoskeletal: Muscle strength 5/5 all ext  Psychiatric: Mood and affect normal  Neck: Unable to assess JvD due to pt size.   Lungs:Clear bilaterally, no wheezes, rhonci, crackles Cardiovascular: Irregular, irregular. No murmurs, gallops or rubs. Abdomen:Soft. Bowel sounds present. Non-tender.  Extremities: 1-2+ bilateral  lower extremity edema.    Labs    High Sensitivity Troponin:  No results for input(s): TROPONINIHS in the last 720 hours.    Cardiac EnzymesNo results for input(s): TROPONINI in the last 168 hours. No results for input(s): TROPIPOC in the last 168 hours.   Chemistry Recent Labs  Lab 07/23/19 0447 07/24/19 0442 07/25/19 0556  NA 137 139 141  K 3.6 3.3* 3.4*  CL 95* 95* 98  CO2 29 30 32  GLUCOSE 99 96 97  BUN 24* 26* 28*  CREATININE 1.57* 1.58* 1.51*  CALCIUM 8.6* 8.8* 9.1  GFRNONAA 45* 44* 47*  GFRAA 52* 51* 54*  ANIONGAP 13 14 11      HematologyNo results for input(s): WBC, RBC, HGB, HCT, MCV, MCH, MCHC, RDW, PLT in the last 168 hours.  BNPNo results for input(s): BNP, PROBNP in the last 168 hours.   DDimer No results for input(s): DDIMER in the last 168 hours.   Radiology    No results found.  Cardiac Studies   Echo 2019 Study Conclusions - Procedure narrative: Technically difficult study with poor echo windows. Definity contrast not given. - Left ventricle: The cavity size was normal. Wall thickness was increased in a pattern of mild LVH. Systolic function  was moderately to severely reduced. The estimated ejection fraction was in the range of 30% to 35%. Diffuse hypokinesis. The study is not technically sufficient to allow evaluation of LV diastolic function. - Mitral valve: Mildly thickened leaflets . There was mild regurgitation. - Left atrium: The atrium was normal in size. - Atrial septum: No defect or patent foramen ovale was identified. - Inferior vena cava: The vessel was normal in size. The respirophasic diameter changes were in the normal range (>= 50%), consistent with normal central venous pressure.  Patient Profile     68 y.o. male with a hx of CABG 2016 w/ LIMA-LAD, SVG-Diag, SVG-PL-PDA, S-D-CHF w/ EF 30-35% by echo 2015, perm Afib on amio and Eliquis, HTN, HLD, GERD, OSAnot on CPAP, gout,who was seen for the evaluation  ofCHF and Bradycardia.   Assessment & Plan    1. Acute on Chronic combined CHF: Pt is at his baseline weight. The weight this morning appears to be wrong (was 329 yesterday and now 307 today). He is down 10.7 liters since admission. BP is soft. Would not restart Entresto yet. This can be started at time of follow up in our office.  -I would discharge him on Lasix 60 mg po BID.   2. Persistent atrial fib: Digoxin and amiodarone have been stopped (TSH was 81).  Continue metoprolol for rate control. Continue Eliquis.  OK to d/c today from cardiac perspective. We will arrange follow up with Dr. Ellyn Hack in several weeks.   For questions or updates, please contact [place] Please consult [email or web address] for contact info under        Signed, Lauree Chandler, MD  07/25/2019, 8:24 AM

## 2019-08-05 NOTE — Progress Notes (Signed)
Cardiology Office Note   Date:  08/07/2019   ID:  Charles SpiceMichael E Cambria, DOB 03/26/1951, MRN [id number]  PCP:  Virl CageyLamb, Andrew S, MD  Cardiologist: Dr. Herbie BaltimoreHarding CC: Hospital Follow up   History of Present Illness: Charles Mckinney is a 68 y.o. male who presents for posthospitalization follow-up after admission for chest pain.  He has a history of CAD with CABG in 2016 (LIMA to LAD, SVG to diagonal, SVG to PL-PDA), mixed CHF with an EF of 30% to 35% by echo, permanent atrial fibrillation on amiodarone and Eliquis, hypertension, hyperlipidemia, with other history to include GERD, OSA not on CPAP, and gout.  The patient was seen on evaluation for recurrent chest pain and decompensated CHF.  He was diuresed with IV Lasix.  He was not started on Entresto with decision to be made on follow-up.  A repeat echocardiogram was not completed during hospitalization.  Discharge weight was 327 pounds.  He was sent home on Lasix 60 mg twice daily.  Digoxin and amiodarone were stopped due to abnormal TSH.  He was continued on metoprolol for rate control along with Eliquis for anticoagulation.  Mr. Annie Parasngold comes today without any complaints.  [facility] and is undergoing physical therapy.  He remains on a low-sodium diet.  He states that he is feeling much better having had diuresis.  He continues to have good diuresis on current medication regimen.  He denies any shortness of breath, chest pain, or dizziness with changing positions.  He is compliant with CPAP.  He denies any bleeding or excessive bruising on Eliquis.    Past Medical History:  Diagnosis Date  . AAA (abdominal aortic aneurysm) without rupture (HCC) 03/14/2019  . Anxiety   . 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 --> CABG x 4: LIMA-LAD, SVG-Diag, SeqSVG-rPL-rPDA --> Intra-Op TEE  showed EF of 30% with only mild MR.   . Chronic combined systolic and diastolic CHF, NYHA class 3 (HCC) 01/19/2015  . 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  Suggestion of anterior infarct on Myoview/echo.  . CKD (chronic kidney disease), stage III (HCC) 05/29/2017  . Coronary artery disease involving native coronary artery of native heart with angina pectoris (HCC) 02/02/2016  . Cough   . Essential hypertension 05/30/2017  . GERD 11/22/2007  . GERD (gastroesophageal reflux disease)   . Gout 05/30/2017  . Hyperlipidemia   . Hyperlipidemia with target LDL less than 70   . Hypertension   . Hypertensive heart disease    Mild to moderately dilated LV with mild LVH.  Marland Kitchen. Hypothyroidism due to medication   . Ischemic cardiomyopathy 01/2014   EF 30-35%.  Severe multivessel CAD noted on cath--CABG x4 -> Myoview (June 2018) shows large anterior-septal-apical infarct with no ischemia.  . Kidney stones   . Long term current use of anticoagulant therapy 01/19/2015  . Morbid obesity (HCC)   . Obesity (BMI 30-39.9)   . OSA (obstructive sleep apnea) 10/04/2017  . Osteoarthritis   . Permanent atrial fibrillation    On Amiodarone & Beta Blocker (on Xarelto)  . Persistent atrial fibrillation (HCC): CHA2DS2Vasc 6    Cardioversion 01/19/15 and amiodarone begun  This patients CHA2DS2-VASc Score and unadjusted  Ischemic Stroke Rate (% per year) is equal to 9.7 % stroke rate/year from a score of 6  Above score calculated as 1 point each if present [CHF, HTN, DM, Vascular=MI/PAD/Aortic Plaque, Age if 65-74, or Male] Above score calculated as 2 points each if present [Age > 75, or Stroke/TIA/TE]   . PONV (postoperative nausea and vomiting)   . Prolonged QT interval 04/26/2017  . 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 TIA (transient ischemic attack) 04/26/2017    Past Surgical History:  Procedure Laterality Date  . CARDIAC  CATHETERIZATION N/A 04/09/2015   Procedure: Right/Left Heart Cath and Coronary Angiography;  Surgeon: Orpah Cobb, MD;  Location: [facility] INVASIVE CV LAB CUPID::  LM 95%; ostLAD 99% - p-dLAD 80%, D1 99% - D2 80%; OCx 90%,pCx 50%; pRCA 80%, mRCA 50%, rPDA 80% & 90%.  . CARDIOVERSION N/A 01/19/2015   Procedure: CARDIOVERSION;  Surgeon: Othella Boyer, MD;  Location: [facility] ENDOSCOPY;  Service: Cardiovascular;  Laterality: N/A;  pt in Afib, 12:22 synched cardioversion @  120 joules using Propofol 100 mg,IV....unsuccessful, repeated at 200 joules  . CARDIOVERSION N/A 04/02/2015   Procedure: CARDIOVERSION;  Surgeon: Orpah Cobb, MD;  Location: [facility] ENDOSCOPY;  Service: Cardiovascular;  Laterality: N/A;  . CATARACT EXTRACTION    . CLIPPING OF ATRIAL APPENDAGE  04/13/2015   Procedure: CLIPPING OF ATRIAL APPENDAGE;  Surgeon: Alleen Borne, MD;  Location: [facility] OR;  Service: Open Heart Surgery;;  . CORONARY ARTERY BYPASS GRAFT N/A 04/13/2015   Procedure: CORONARY ARTERY BYPASS GRAFTING (CABG);  Surgeon: Alleen Borne, MD;  Location: [facility] OR;  Service: Open Heart Surgery;; LIMA-LAD, SVG-Diag, SeqSVG-rPL-rPDA   . NM MYOVIEW LTD  05/2017   EF 27%.  No reversible ischemia.  Large anterior-apical and septal infarct.  Diffuse LV HK and moderate LV dilation.  . TEE WITHOUT CARDIOVERSION N/A 04/13/2015   Procedure: TRANSESOPHAGEAL ECHOCARDIOGRAM (TEE);  Surgeon: Alleen Borne, MD;  Location: [facility] OR;  Service: Open Heart Surgery;  Laterality: N/A;  . TRANSTHORACIC ECHOCARDIOGRAM  2/'15, 2/'16, 4/'16   a) EF 30-35%.  Mild LVH.  GRII DD.  Mild LA dilation.  Moderate diastolic dysfunction. b) Technically difficult.  Mild LVH.  EF 35%.  Diffuse HK.  Moderate LA dilation.  Mildly dilated RV with mildly reduced function.  c)   Mild concentric LVH.  EF 30-35%, diffuse HK.  Severe HK of mid apical inferior wall.  Moderate LA dilation.  . TRANSTHORACIC ECHOCARDIOGRAM N/A 1/18, 5/18, 7/18   a) Moderate severely reduced LV.  EF 30 of 35% with  diffuse HK.  Mild MR.  Mildly dilated RA.;; b) (In setting of TIA).  Technically difficult mild to moderate globally reduced LV function.  Mild LVH.  Mild MR.  EF estimated 40-45%.  Moderate LA dilation.;; c)  Afib. Mild to mod dilated LV with mild LVH. EF 30-35% with diffuse HK. Mildly dilated RV with mildly decrease Systolic Fxn. Mild MR     Current Outpatient Medications  Medication Sig Dispense Refill  . albuterol (PROAIR HFA) 108 (90 Base) MCG/ACT inhaler INHALE 1 PUFF INTO THE LUNGS EVERY 6 HOURS AS NEEDED FOR WHEEZING OR SHORTNESS OF BREATH 8.5 g 3  . albuterol (PROVENTIL) (2.5 MG/3ML) 0.083% nebulizer solution USE ONE VIAL USING NEBULIZER EVERY 6 (SIX) HOURS AS NEEDED FOR WHEEZING OR SHORTNESS OF BREATH. (Patient taking differently: Take 2.5 mg by nebulization every 6 (six) hours as needed for wheezing or shortness of breath. ) 150 mL 1  . allopurinol (ZYLOPRIM) 100  MG tablet Take 200 mg by mouth daily.    Marland Kitchen apixaban (ELIQUIS) 5 MG TABS tablet Take 1 tablet (5 mg total) by mouth 2 (two) times daily. 60 tablet 0  . fluticasone (FLONASE) 50 MCG/ACT nasal spray Place 1 spray into both nostrils daily.    . Fluticasone-Salmeterol (ADVAIR) 250-50 MCG/DOSE AEPB Inhale 1 puff into the lungs 2 (two) times daily.    . furosemide (LASIX) 20 MG tablet Take 3 tablets (60 mg total) by mouth 2 (two) times daily.    Marland Kitchen levothyroxine (SYNTHROID) 50 MCG tablet Take 1 tablet (50 mcg total) by mouth daily at 6 (six) AM.    . metoprolol tartrate (LOPRESSOR) 25 MG tablet Take 0.5 tablets (12.5 mg total) by mouth 2 (two) times daily.    . nitroGLYCERIN (NITROSTAT) 0.4 MG SL tablet Place 0.4 mg under the tongue every 5 (five) minutes as needed for chest pain.    . potassium chloride SA (K-DUR) 20 MEQ tablet Take 20 mEq by mouth daily.    . pravastatin (PRAVACHOL) 40 MG tablet TAKE ONE TABLET BY MOUTH ONCE DAILY (Patient taking differently: Take 40 mg by mouth daily. ) 90 tablet 1   No current facility-administered  medications for this visit.     Allergies:   Niacin and related, Xarelto [rivaroxaban], and Penicillins    Social History:  The patient  reports that he quit smoking about 20 years ago. He has never used smokeless tobacco. He reports that he does not drink alcohol or use drugs.   Family History:  The patient's family history includes Congestive Heart Failure in his sister; Diabetes in his brother and mother; Heart disease in his brother and father; Stroke in his brother.    ROS: All other systems are reviewed and negative. Unless otherwise mentioned in H&P    PHYSICAL EXAM: VS:  BP 104/64   Pulse 80   Ht 5\' 10"  (1.778 m)   Wt (!) 327 lb 3.2 oz (148.4 kg)   BMI 46.95 kg/m  , BMI Body mass index is 46.95 kg/m. GEN: Well nourished, well developed, in no acute distress HEENT: normal Neck: no JVD, carotid bruits, or masses Cardiac: IRRR; no murmurs, rubs, or gallops,no edema  Respiratory:  Clear to auscultation bilaterally, normal work of breathing GI: soft, nontender, nondistended, + BS MS: no deformity or atrophy Skin: warm and dry, no rash Neuro:  Strength and sensation are intact Psych: euthymic mood, full affect   EKG: Not completed this office visit.  Recent Labs: 07/15/2019: ALT 26; B Natriuretic Peptide 298.0; Hemoglobin 12.5; Platelets 185 07/17/2019: TSH 79.146 07/23/2019: Magnesium 2.4 07/25/2019: BUN 28; Creatinine, Ser 1.51; Potassium 3.4; Sodium 141    Lipid Panel    Component Value Date/Time   CHOL 191 05/30/2017 0510   TRIG 247 (H) 05/30/2017 0510   TRIG 243 (HH) 09/12/2006 1309   HDL 43 05/30/2017 0510   CHOLHDL 4.4 05/30/2017 0510   VLDL 49 (H) 05/30/2017 0510   LDLCALC 99 05/30/2017 0510   LDLDIRECT 164.8 09/12/2006 1309      Wt Readings from Last 3 Encounters:  08/07/19 (!) 327 lb 3.2 oz (148.4 kg)  07/25/19 (!) 307 lb 12.8 oz (139.6 kg)  05/03/19 (!) 331 lb (150.1 kg)      Other studies Reviewed: Echocardiogram 11/07/18 Procedure  narrative: Technically difficult study with poor echo   windows. Definity contrast not given. - Left ventricle: The cavity size was normal. Wall thickness was   increased in a pattern  of mild LVH. Systolic function was   moderately to severely reduced. The estimated ejection fraction   was in the range of 30% to 35%. Diffuse hypokinesis. The study is   not technically sufficient to allow evaluation of LV diastolic   function. - Mitral valve: Mildly thickened leaflets . There was mild   regurgitation. - Left atrium: The atrium was normal in size. - Atrial septum: No defect or patent foramen ovale was identified. - Inferior vena cava: The vessel was normal in size. The   respirophasic diameter changes were in the normal range (>= 50%),   consistent with normal central venous pressure.  ASSESSMENT AND PLAN:  1.  Ischemic cardiomyopathy: Most recent echocardiogram revealing EF of 30 to 35%.  He is currently not on Entresto due to hypotension.  He has tolerating metoprolol, furosemide without dizziness.  Lower extremity edema is well-managed at this time although he does have some mild dependent edema while sitting in a wheelchair.  I will continue his current medication regimen for now without any additions or changes.  Follow-up BMET in a couple weeks.  He is advised on a low-sodium diet which is also [facility] which are being sent back to the skilled nursing facility.  He is recommended for knee-high support hose.  He is also given some socks from our office with tread as he states that he has been running out of socks where he currently lives because they are getting lost in the laundry.  He has asked his son to bring him additional socks.  A brochure for elastic therapy is given to him to get to his son to purchase support hose.  2.  Atrial fibrillation: Heart rate is controlled currently he will continue apixaban 5 mg twice daily as directed.  No excessive  bruising or reports of hemoptysis or melena.  3.  OSA: He is compliant with CPAP.  This is followed by PCP and physician at skilled nursing facility.  Current medicines are reviewed at length with the patient today.  We will see him on close follow-up in 1 month  Labs/ tests ordered today include: BMET Bettey MareKathryn M. Liborio NixonLawrence DNP, ANP, AACC     08/07/2019 10:12 AM    [facility] [address] Office [phone] Fax [phone]

## 2019-08-07 ENCOUNTER — Other Ambulatory Visit: Payer: Self-pay

## 2019-08-07 ENCOUNTER — Encounter: Payer: Self-pay | Admitting: Adult Health

## 2019-08-07 ENCOUNTER — Ambulatory Visit (INDEPENDENT_AMBULATORY_CARE_PROVIDER_SITE_OTHER): Payer: Medicare Other | Admitting: Adult Health

## 2019-08-07 VITALS — BP 104/64 | HR 80 | Ht 70.0 in | Wt 327.2 lb

## 2019-08-07 DIAGNOSIS — G4733 Obstructive sleep apnea (adult) (pediatric): Secondary | ICD-10-CM

## 2019-08-07 DIAGNOSIS — Z79899 Other long term (current) drug therapy: Secondary | ICD-10-CM | POA: Diagnosis not present

## 2019-08-07 DIAGNOSIS — I5042 Chronic combined systolic (congestive) and diastolic (congestive) heart failure: Secondary | ICD-10-CM

## 2019-08-07 DIAGNOSIS — I43 Cardiomyopathy in diseases classified elsewhere: Secondary | ICD-10-CM | POA: Diagnosis not present

## 2019-08-07 DIAGNOSIS — I4819 Other persistent atrial fibrillation: Secondary | ICD-10-CM

## 2019-08-07 DIAGNOSIS — Z9989 Dependence on other enabling machines and devices: Secondary | ICD-10-CM

## 2019-08-07 NOTE — Patient Instructions (Addendum)
Medication Instructions:  Continue current medications  If you need a refill on your cardiac medications before your next appointment, please call your pharmacy.  Labwork: BMP in 2 weeks  Testing/Procedures: None ordered   Follow-Up: . Your physician recommends that you schedule a follow-up appointment in: 1 Month October 7th @ 11:15 am   At [facility], you and your health needs are our priority.  As part of our continuing mission to provide you with exceptional heart care, we have created designated Provider Care Teams.  These Care Teams include your primary Cardiologist (physician) and Advanced Practice Providers (APPs -  Physician Assistants and Nurse Practitioners) who all work together to provide you with the care you need, when you need it.  Thank you for choosing [facility]!!

## 2019-09-10 NOTE — Progress Notes (Signed)
Cardiology Office Note   Date:  09/11/2019   ID:  Charles Mckinney, DOB Feb 18, 1951, MRN [id number]  PCP:  Charles Cagey, MD  Cardiologist: Dr. [name] CC: Follow-up  History of Present Illness: Charles Mckinney is a 68 y.o. 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  He was last seen in the office on 08/07/2019 status post hospitalization for chest pain and decompensated CHF.  Discharge weight was 327 pounds.  When seen last he was at [facility] skilled nursing facility and was undergoing physical therapy.  He was remaining on a low-sodium diet, was compliant with medications through nursing home staff, he is not compliant with CPAP, and was continued to have good diuresis on medication regimen.  He was also advised on support hose, to the knees, and was given some socks with tread on them to take home to the nursing home with him. He had run out of socks and his son was to be bringing him more socks.  He is here today without any significant complaints.  He continues to lose weight on a low-sodium diet with medical compliance is a patient of [facility] nursing facility.  He is just restarted physical therapy as it had been discontinued temporarily due to pandemic.  He walks about in his room and sometimes goes outside in a Courtyard.  He denies any pain or significant dyspnea on exertion.  He does have some bilateral pretibial wounds which are being addressed by wound care nurse at skilled nursing facility.  He has a good bit of dependent edema and is unable to wear support hose due to his large legs which causes him to have pain.  The patient is advised to keep his legs elevated as much as possible.   He states that the skilled nursing facility does not have recliners or other meds in order to keep legs elevated.  His family will have to bring him something for this.  He is somewhat depressed concerning his current living status.  He did not like having to leave his home but he is in a much safer place and is more compliant since his admission there.  He is aware of that.  Past Medical History:  Diagnosis Date   AAA (abdominal aortic aneurysm) without rupture (HCC) 03/14/2019   Anxiety    CAD, multiple vessel 04/2015   Right and Left Heart Catheterization Apr 09, 2015: LM 95%; ostLAD 99% - p-dLAD 80%, D1 99% - D2 80%; OCx 90%,pCx 50%; pRCA 80%, mRCA 50%, rPDA 80% & 90%. --> CABG x 4: LIMA-LAD, SVG-Diag, SeqSVG-rPL-rPDA --> Intra-Op TEE showed EF of 30% with only mild MR.    Chronic combined systolic and diastolic CHF, NYHA class 3 (HCC) 01/19/2015   Chronic combined systolic and diastolic heart failure, NYHA class 3 (HCC) 01/2014   EF is been ranging from 30-35% since February 2015.  Suggestion of anterior infarct on Myoview/echo.   CKD (chronic kidney disease), stage III 05/29/2017   Coronary artery disease involving native coronary artery of native heart with angina pectoris (HCC) 02/02/2016   Cough    Essential hypertension 05/30/2017   GERD 11/22/2007   GERD (gastroesophageal reflux disease)    Gout 05/30/2017  Hyperlipidemia    Hyperlipidemia with target LDL less than 70    Hypertension    Hypertensive heart disease    Mild to moderately dilated LV with mild LVH.   Hypothyroidism due to medication    Ischemic cardiomyopathy 01/2014   EF 30-35%.  Severe multivessel CAD noted on cath--CABG x4 -> Myoview (June 2018) shows large anterior-septal-apical infarct with no ischemia.   Kidney stones    Long term current use of anticoagulant therapy 01/19/2015   Morbid obesity (HCC)    Obesity (BMI 30-39.9)    OSA (obstructive sleep apnea) 10/04/2017   Osteoarthritis     Permanent atrial fibrillation (HCC)    On Amiodarone & Beta Blocker (on Xarelto)   Persistent atrial fibrillation ([place]): CHA2DS2Vasc 6    Cardioversion 01/19/15 and amiodarone begun  This patients CHA2DS2-VASc Score and unadjusted Ischemic Stroke Rate (% per year) is equal to 9.7 % stroke rate/year from a score of 6  Above score calculated as 1 point each if present [CHF, HTN, DM, Vascular=MI/PAD/Aortic Plaque, Age if 65-74, or Male] Above score calculated as 2 points each if present [Age > 75, or Stroke/TIA/TE]    PONV (postoperative nausea and vomiting)    Prolonged QT interval 04/26/2017   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   CARDIOVERSION N/A 01/19/2015   Procedure: CARDIOVERSION;  Surgeon: Jacolyn Reedy, MD;  Location: [facility] ENDOSCOPY;  Service: Cardiovascular;  Laterality: N/A;  pt in Afib, 12:22 synched cardioversion @  120 joules using Propofol 100 mg,IV....unsuccessful, repeated at 200 joules   CARDIOVERSION N/A 04/02/2015   Procedure: CARDIOVERSION;  Surgeon: Dixie Dials, MD;  Location: [facility];  Service: Cardiovascular;  Laterality: N/A;   CATARACT EXTRACTION     CLIPPING OF ATRIAL APPENDAGE  04/13/2015   Procedure: CLIPPING OF ATRIAL APPENDAGE;  Surgeon: Gaye Pollack, MD;  Location: [place];  Service: Open Heart Surgery;;   CORONARY ARTERY BYPASS GRAFT N/A 04/13/2015   Procedure: CORONARY ARTERY BYPASS GRAFTING (CABG);  Surgeon: Gaye Pollack, MD;  Location: [facility] OR;  Service: Open Heart Surgery;; LIMA-LAD, SVG-Diag, SeqSVG-rPL-rPDA    NM MYOVIEW LTD  05/2017   EF 27%.  No reversible ischemia.  Large anterior-apical and  septal infarct.  Diffuse LV HK and moderate LV dilation.   TEE WITHOUT CARDIOVERSION N/A 04/13/2015   Procedure: TRANSESOPHAGEAL ECHOCARDIOGRAM (TEE);  Surgeon: Gaye Pollack, MD;  Location: [place];  Service: Open Heart Surgery;  Laterality: N/A;   TRANSTHORACIC ECHOCARDIOGRAM  2/'15, 2/'16, 4/'16   a) EF 30-35%.  Mild LVH.  GRII DD.  Mild LA dilation.  Moderate diastolic dysfunction. b) Technically difficult.  Mild LVH.  EF 35%.  Diffuse HK.  Moderate LA dilation.  Mildly dilated RV with mildly reduced function.  c)   Mild concentric LVH.  EF 30-35%, diffuse HK.  Severe HK of mid apical inferior wall.  Moderate LA dilation.   TRANSTHORACIC ECHOCARDIOGRAM N/A 1/18, 5/18, 7/18   a) Moderate severely reduced LV.  EF 30 of 35% with diffuse HK.  Mild MR.  Mildly dilated RA.;; b) (In setting of TIA).  Technically difficult  mild to moderate globally reduced LV function.  Mild LVH.  Mild MR.  EF estimated 40-45%.  Moderate LA dilation.;; c)  Afib. Mild to mod dilated LV with mild LVH. EF 30-35% with diffuse HK. Mildly dilated RV with mildly decrease Systolic Fxn. Mild MR     Current Outpatient Medications  Medication Sig Dispense Refill   albuterol (PROAIR HFA) 108 (90 Base) MCG/ACT inhaler INHALE 1 PUFF INTO THE LUNGS EVERY 6 HOURS AS NEEDED FOR WHEEZING OR SHORTNESS OF BREATH 8.5 g 3   albuterol (PROVENTIL) (2.5 MG/3ML) 0.083% nebulizer solution USE ONE VIAL USING NEBULIZER EVERY 6 (SIX) HOURS AS NEEDED FOR WHEEZING OR SHORTNESS OF BREATH. (Patient taking differently: Take 2.5 mg by nebulization every 6 (six) hours as needed for wheezing or shortness of breath. ) 150 mL 1   allopurinol (ZYLOPRIM) 100 MG tablet Take 200 mg by mouth daily.     apixaban (ELIQUIS) 5 MG TABS tablet Take 1 tablet (5 mg total) by mouth 2 (two) times daily. 60 tablet 0   fluticasone (FLONASE) 50 MCG/ACT nasal spray Place 1 spray into both nostrils daily.     Fluticasone-Salmeterol (ADVAIR) 250-50 MCG/DOSE AEPB Inhale 1  puff into the lungs 2 (two) times daily.     furosemide (LASIX) 80 MG tablet Take 1 tablet by mouth 2 (two) times daily.     levothyroxine (SYNTHROID) 100 MCG tablet Take 1 tablet by mouth daily.     metoprolol succinate (TOPROL-XL) 25 MG 24 hr tablet Take 12.5 mg by mouth daily.     nitroGLYCERIN (NITROSTAT) 0.4 MG SL tablet Place 0.4 mg under the tongue every 5 (five) minutes as needed for chest pain.     potassium chloride SA (K-DUR) 20 MEQ tablet Take 20 mEq by mouth daily.     pravastatin (PRAVACHOL) 40 MG tablet TAKE ONE TABLET BY MOUTH ONCE DAILY (Patient taking differently: Take 40 mg by mouth daily. ) 90 tablet 1   No current facility-administered medications for this visit.     Allergies:   Niacin and related, Xarelto [rivaroxaban], and Penicillins    Social History:  The patient  reports that he quit smoking about 20 years ago. He has never used smokeless tobacco. He reports that he does not drink alcohol or use drugs.   Family History:  The patient's family history includes Congestive Heart Failure in his sister; Diabetes in his brother and mother; Heart disease in his brother and father; Stroke in his brother.    ROS: All other systems are reviewed and negative. Unless otherwise mentioned in H&P    PHYSICAL EXAM: VS:  BP 130/79    Pulse 72    Temp 97.7 F (36.5 C)    Ht 5\' 10"  (1.778 m)    Wt (!) 318 lb (144.2 kg)    SpO2 95%    BMI 45.63 kg/m  , BMI Body mass index is 45.63 kg/m. GEN: Well nourished, well developed, in no acute distress HEENT: normal Neck: no JVD, carotid bruits, or masses Cardiac: I monitor and there was another lady I put her upright with me RRR; distant heart sounds unable to auscultate murmurs, rubs, or gallops, 1+ to 2+ bilateral pretibial edema. Respiratory:  Clear to auscultation bilaterally, with, GI: soft, nontender, nondistended, + BS MS: no deformity or atrophy.,  Bilateral lower extremity Ace wraps are noted.  There is no oozing or  wetness on view.-Order lab recommended by 1-1/2 x 1 Skin: warm and dry, no rash Neuro:  Strength  and sensation are intact Psych: euthymic mood, flat affect.   EKG: Not completed this office visit.  Recent Labs: 07/15/2019: ALT 26; B Natriuretic Peptide 298.0; Hemoglobin 12.5; Platelets 185 07/17/2019: TSH 79.146 07/23/2019: Magnesium 2.4 07/25/2019: BUN 28; Creatinine, Ser 1.51; Potassium 3.4; Sodium 141    Lipid Panel    Component Value Date/Time   CHOL 191 05/30/2017 0510   TRIG 247 (H) 05/30/2017 0510   TRIG 243 (HH) 09/12/2006 1309   HDL 43 05/30/2017 0510   CHOLHDL 4.4 05/30/2017 0510   VLDL 49 (H) 05/30/2017 0510   LDLCALC 99 05/30/2017 0510   LDLDIRECT 164.8 09/12/2006 1309      Wt Readings from Last 3 Encounters:  09/11/19 (!) 318 lb (144.2 kg)  08/07/19 (!) 327 lb 3.2 oz (148.4 kg)  07/25/19 (!) 307 lb 12.8 oz (139.6 kg)      Other studies Reviewed: Echocardiogram 10/22/18 Procedure narrative: Technically difficult study with poor echo windows. Definity contrast not given. - Left ventricle: The cavity size was normal. Wall thickness was increased in a pattern of mild LVH. Systolic function was moderately to severely reduced. The estimated ejection fraction was in the range of 30% to 35%. Diffuse hypokinesis. The study is not technically sufficient to allow evaluation of LV diastolic function. - Mitral valve: Mildly thickened leaflets . There was mild regurgitation. - Left atrium: The atrium was normal in size. - Atrial septum: No defect or patent foramen ovale was identified. - Inferior vena cava: The vessel was normal in size. The respirophasic diameter changes were in the normal range (>= 50%), consistent with normal central venous pressure.   ASSESSMENT AND PLAN:  1.  Coronary artery disease: Currently without complaints of chest discomfort.  He is not very active, he is undergoing physical therapy now and uses a walker and a  wheelchair for ambulation.  With minimal ambulation and activity he is not having any angina symptoms.  We will not make any changes in his regimen at this time.  Continue secondary prevention.  2.  Permanent atrial fibrillation: Remains on apixaban 5 mg twice daily, metoprolol 12.5 mg twice daily.  He denies any episodes of heart racing, bleeding, melena, or excessive bruising.  Heart rate is well controlled currently on metoprolol.  3.  Ischemic cardiomyopathy: Most recent echocardiogram revealed an LVEF of 30% to 35%.  He is on Lasix 60 mg twice daily.  Blood pressures not optimal for reduced EF.  Not a candidate for Entresto, ARB, or ACE due to chronic kidney disease.  Can consider hydralazine although this will intensify lower extremity edema as he has a good bit of dependent edema now.  On next office visit would like to repeat his echocardiogram for changes, and hopeful improvement of his LV function.  He is lost about 15 pounds since being a patient at [facility] skilled nursing facility and has been provided with a better nutritional regimen.  He is keeping fluids down and does not have evidence of volume overload on this office visit.  Continue furosemide as directed.  4.  Hypercholesterolemia: Continues on pravastatin 40 mg daily.  He will need to have repeat labs on next office visit if not completed by primary care.   Current medicines are reviewed at length with the patient today.    Labs/ tests ordered today include: None Bettey Mare. Liborio Nixon, ANP, AACC   09/11/2019 12:06 PM    [facility] [address] Office [phone] Fax [phone]  Notice: This dictation was prepared with Dragon dictation along with smaller phrase technology. Any transcriptional errors that result from this process are unintentional and may not be corrected upon review.

## 2019-09-11 ENCOUNTER — Other Ambulatory Visit: Payer: Self-pay

## 2019-09-11 ENCOUNTER — Encounter: Payer: Self-pay | Admitting: Adult Health

## 2019-09-11 ENCOUNTER — Ambulatory Visit (INDEPENDENT_AMBULATORY_CARE_PROVIDER_SITE_OTHER): Payer: Medicare Other | Admitting: Adult Health

## 2019-09-11 VITALS — BP 130/79 | HR 72 | Temp 97.7°F | Ht 70.0 in | Wt 318.0 lb

## 2019-09-11 DIAGNOSIS — I43 Cardiomyopathy in diseases classified elsewhere: Secondary | ICD-10-CM

## 2019-09-11 DIAGNOSIS — I5042 Chronic combined systolic (congestive) and diastolic (congestive) heart failure: Secondary | ICD-10-CM

## 2019-09-11 DIAGNOSIS — I10 Essential (primary) hypertension: Secondary | ICD-10-CM | POA: Diagnosis not present

## 2019-09-11 DIAGNOSIS — E7800 Pure hypercholesterolemia, unspecified: Secondary | ICD-10-CM | POA: Diagnosis not present

## 2019-09-11 DIAGNOSIS — I4819 Other persistent atrial fibrillation: Secondary | ICD-10-CM

## 2019-09-11 NOTE — Patient Instructions (Signed)
Medication Instructions:  Continue current medications  If you need a refill on your cardiac medications before your next appointment, please call your pharmacy.  Labwork: None Ordered  Testing/Procedures: None ordered  Follow-Up: . Your physician recommends that you schedule a follow-up appointment in: 6 Months with Merna, you and your health needs are our priority.  As part of our continuing mission to provide you with exceptional heart care, we have created designated Provider Care Teams.  These Care Teams include your primary Cardiologist (physician) and Advanced Practice Providers (APPs -  Physician Assistants and Nurse Practitioners) who all work together to provide you with the care you need, when you need it.  Thank you for choosing [facility] at [facility]!!

## 2020-03-12 ENCOUNTER — Telehealth: Payer: Self-pay | Admitting: *Deleted

## 2020-03-12 NOTE — Telephone Encounter (Signed)
A message was left, re: his follow up visit. 

## 2020-04-21 ENCOUNTER — Telehealth: Payer: Self-pay | Admitting: *Deleted

## 2020-04-21 NOTE — Telephone Encounter (Signed)
A message was left, re: his follow up visit. 

## 2023-03-24 ENCOUNTER — Other Ambulatory Visit: Payer: Self-pay

## 2023-03-24 ENCOUNTER — Inpatient Hospital Stay (HOSPITAL_COMMUNITY)
Admission: EM | Admit: 2023-03-24 | Discharge: 2023-04-08 | DRG: 871 | Disposition: A | Discharge status: Hospice | Payer: No Typology Code available for payment source | Source: Skilled Nursing Facility | Attending: Internal Medicine | Admitting: Internal Medicine

## 2023-03-24 DIAGNOSIS — Z01810 Encounter for preprocedural cardiovascular examination: Secondary | ICD-10-CM

## 2023-03-24 DIAGNOSIS — R911 Solitary pulmonary nodule: Secondary | ICD-10-CM | POA: Diagnosis present

## 2023-03-24 DIAGNOSIS — J449 Chronic obstructive pulmonary disease, unspecified: Secondary | ICD-10-CM | POA: Diagnosis present

## 2023-03-24 DIAGNOSIS — Z9981 Dependence on supplemental oxygen: Secondary | ICD-10-CM

## 2023-03-24 DIAGNOSIS — I5042 Chronic combined systolic (congestive) and diastolic (congestive) heart failure: Secondary | ICD-10-CM | POA: Diagnosis present

## 2023-03-24 DIAGNOSIS — K219 Gastro-esophageal reflux disease without esophagitis: Secondary | ICD-10-CM | POA: Diagnosis present

## 2023-03-24 DIAGNOSIS — Z8249 Family history of ischemic heart disease and other diseases of the circulatory system: Secondary | ICD-10-CM

## 2023-03-24 DIAGNOSIS — F419 Anxiety disorder, unspecified: Secondary | ICD-10-CM | POA: Diagnosis present

## 2023-03-24 DIAGNOSIS — K760 Fatty (change of) liver, not elsewhere classified: Secondary | ICD-10-CM | POA: Diagnosis present

## 2023-03-24 DIAGNOSIS — Z7401 Bed confinement status: Secondary | ICD-10-CM

## 2023-03-24 DIAGNOSIS — I5041 Acute combined systolic (congestive) and diastolic (congestive) heart failure: Secondary | ICD-10-CM

## 2023-03-24 DIAGNOSIS — D62 Acute posthemorrhagic anemia: Secondary | ICD-10-CM | POA: Diagnosis present

## 2023-03-24 DIAGNOSIS — E876 Hypokalemia: Secondary | ICD-10-CM | POA: Diagnosis not present

## 2023-03-24 DIAGNOSIS — Z87891 Personal history of nicotine dependence: Secondary | ICD-10-CM

## 2023-03-24 DIAGNOSIS — D649 Anemia, unspecified: Secondary | ICD-10-CM | POA: Diagnosis present

## 2023-03-24 DIAGNOSIS — Z87442 Personal history of urinary calculi: Secondary | ICD-10-CM

## 2023-03-24 DIAGNOSIS — Z951 Presence of aortocoronary bypass graft: Secondary | ICD-10-CM

## 2023-03-24 DIAGNOSIS — Z7189 Other specified counseling: Secondary | ICD-10-CM

## 2023-03-24 DIAGNOSIS — K259 Gastric ulcer, unspecified as acute or chronic, without hemorrhage or perforation: Secondary | ICD-10-CM | POA: Diagnosis present

## 2023-03-24 DIAGNOSIS — N1832 Chronic kidney disease, stage 3b: Secondary | ICD-10-CM | POA: Diagnosis present

## 2023-03-24 DIAGNOSIS — Z79890 Hormone replacement therapy (postmenopausal): Secondary | ICD-10-CM

## 2023-03-24 DIAGNOSIS — I5043 Acute on chronic combined systolic (congestive) and diastolic (congestive) heart failure: Secondary | ICD-10-CM | POA: Diagnosis present

## 2023-03-24 DIAGNOSIS — Z8673 Personal history of transient ischemic attack (TIA), and cerebral infarction without residual deficits: Secondary | ICD-10-CM

## 2023-03-24 DIAGNOSIS — Z7901 Long term (current) use of anticoagulants: Secondary | ICD-10-CM

## 2023-03-24 DIAGNOSIS — D6869 Other thrombophilia: Secondary | ICD-10-CM | POA: Diagnosis present

## 2023-03-24 DIAGNOSIS — I130 Hypertensive heart and chronic kidney disease with heart failure and stage 1 through stage 4 chronic kidney disease, or unspecified chronic kidney disease: Secondary | ICD-10-CM | POA: Diagnosis present

## 2023-03-24 DIAGNOSIS — R748 Abnormal levels of other serum enzymes: Secondary | ICD-10-CM | POA: Diagnosis present

## 2023-03-24 DIAGNOSIS — K229 Disease of esophagus, unspecified: Secondary | ICD-10-CM | POA: Diagnosis present

## 2023-03-24 DIAGNOSIS — L89322 Pressure ulcer of left buttock, stage 2: Secondary | ICD-10-CM | POA: Diagnosis present

## 2023-03-24 DIAGNOSIS — I493 Ventricular premature depolarization: Secondary | ICD-10-CM | POA: Diagnosis present

## 2023-03-24 DIAGNOSIS — Z6841 Body Mass Index (BMI) 40.0 and over, adult: Secondary | ICD-10-CM

## 2023-03-24 DIAGNOSIS — Z880 Allergy status to penicillin: Secondary | ICD-10-CM

## 2023-03-24 DIAGNOSIS — Z833 Family history of diabetes mellitus: Secondary | ICD-10-CM

## 2023-03-24 DIAGNOSIS — E871 Hypo-osmolality and hyponatremia: Secondary | ICD-10-CM | POA: Diagnosis present

## 2023-03-24 DIAGNOSIS — C19 Malignant neoplasm of rectosigmoid junction: Secondary | ICD-10-CM | POA: Diagnosis present

## 2023-03-24 DIAGNOSIS — E7800 Pure hypercholesterolemia, unspecified: Secondary | ICD-10-CM | POA: Diagnosis present

## 2023-03-24 DIAGNOSIS — I25119 Atherosclerotic heart disease of native coronary artery with unspecified angina pectoris: Secondary | ICD-10-CM | POA: Diagnosis present

## 2023-03-24 DIAGNOSIS — I255 Ischemic cardiomyopathy: Secondary | ICD-10-CM | POA: Diagnosis present

## 2023-03-24 DIAGNOSIS — D123 Benign neoplasm of transverse colon: Secondary | ICD-10-CM | POA: Diagnosis present

## 2023-03-24 DIAGNOSIS — M170 Bilateral primary osteoarthritis of knee: Secondary | ICD-10-CM | POA: Diagnosis present

## 2023-03-24 DIAGNOSIS — L89312 Pressure ulcer of right buttock, stage 2: Secondary | ICD-10-CM | POA: Diagnosis present

## 2023-03-24 DIAGNOSIS — I959 Hypotension, unspecified: Secondary | ICD-10-CM

## 2023-03-24 DIAGNOSIS — J9611 Chronic respiratory failure with hypoxia: Secondary | ICD-10-CM | POA: Diagnosis present

## 2023-03-24 DIAGNOSIS — K648 Other hemorrhoids: Secondary | ICD-10-CM | POA: Diagnosis present

## 2023-03-24 DIAGNOSIS — D124 Benign neoplasm of descending colon: Secondary | ICD-10-CM | POA: Diagnosis present

## 2023-03-24 DIAGNOSIS — R6520 Severe sepsis without septic shock: Secondary | ICD-10-CM | POA: Diagnosis present

## 2023-03-24 DIAGNOSIS — Z993 Dependence on wheelchair: Secondary | ICD-10-CM

## 2023-03-24 DIAGNOSIS — N179 Acute kidney failure, unspecified: Secondary | ICD-10-CM | POA: Diagnosis present

## 2023-03-24 DIAGNOSIS — Z8679 Personal history of other diseases of the circulatory system: Secondary | ICD-10-CM

## 2023-03-24 DIAGNOSIS — Z79899 Other long term (current) drug therapy: Secondary | ICD-10-CM

## 2023-03-24 DIAGNOSIS — I452 Bifascicular block: Secondary | ICD-10-CM | POA: Diagnosis present

## 2023-03-24 DIAGNOSIS — D125 Benign neoplasm of sigmoid colon: Secondary | ICD-10-CM | POA: Diagnosis present

## 2023-03-24 DIAGNOSIS — Z7951 Long term (current) use of inhaled steroids: Secondary | ICD-10-CM

## 2023-03-24 DIAGNOSIS — R5381 Other malaise: Secondary | ICD-10-CM | POA: Diagnosis present

## 2023-03-24 DIAGNOSIS — R4589 Other symptoms and signs involving emotional state: Secondary | ICD-10-CM

## 2023-03-24 DIAGNOSIS — C189 Malignant neoplasm of colon, unspecified: Secondary | ICD-10-CM

## 2023-03-24 DIAGNOSIS — Z515 Encounter for palliative care: Secondary | ICD-10-CM

## 2023-03-24 DIAGNOSIS — Z823 Family history of stroke: Secondary | ICD-10-CM

## 2023-03-24 DIAGNOSIS — I4819 Other persistent atrial fibrillation: Secondary | ICD-10-CM | POA: Diagnosis present

## 2023-03-24 DIAGNOSIS — E039 Hypothyroidism, unspecified: Secondary | ICD-10-CM | POA: Diagnosis present

## 2023-03-24 DIAGNOSIS — D122 Benign neoplasm of ascending colon: Secondary | ICD-10-CM | POA: Diagnosis present

## 2023-03-24 DIAGNOSIS — A4151 Sepsis due to Escherichia coli [E. coli]: Principal | ICD-10-CM | POA: Diagnosis present

## 2023-03-24 DIAGNOSIS — G479 Sleep disorder, unspecified: Secondary | ICD-10-CM

## 2023-03-24 DIAGNOSIS — Z66 Do not resuscitate: Secondary | ICD-10-CM | POA: Diagnosis not present

## 2023-03-24 DIAGNOSIS — R0602 Shortness of breath: Secondary | ICD-10-CM

## 2023-03-24 DIAGNOSIS — K254 Chronic or unspecified gastric ulcer with hemorrhage: Secondary | ICD-10-CM | POA: Diagnosis present

## 2023-03-24 DIAGNOSIS — R410 Disorientation, unspecified: Secondary | ICD-10-CM

## 2023-03-24 DIAGNOSIS — I4821 Permanent atrial fibrillation: Secondary | ICD-10-CM

## 2023-03-24 DIAGNOSIS — K5669 Other partial intestinal obstruction: Secondary | ICD-10-CM | POA: Diagnosis present

## 2023-03-24 DIAGNOSIS — G4700 Insomnia, unspecified: Secondary | ICD-10-CM | POA: Diagnosis present

## 2023-03-24 DIAGNOSIS — R531 Weakness: Secondary | ICD-10-CM

## 2023-03-24 DIAGNOSIS — R7881 Bacteremia: Secondary | ICD-10-CM

## 2023-03-24 DIAGNOSIS — K922 Gastrointestinal hemorrhage, unspecified: Principal | ICD-10-CM

## 2023-03-24 DIAGNOSIS — K8020 Calculus of gallbladder without cholecystitis without obstruction: Secondary | ICD-10-CM | POA: Diagnosis present

## 2023-03-24 DIAGNOSIS — D120 Benign neoplasm of cecum: Secondary | ICD-10-CM | POA: Diagnosis present

## 2023-03-24 DIAGNOSIS — G4733 Obstructive sleep apnea (adult) (pediatric): Secondary | ICD-10-CM | POA: Diagnosis present

## 2023-03-24 DIAGNOSIS — E662 Morbid (severe) obesity with alveolar hypoventilation: Secondary | ICD-10-CM | POA: Diagnosis present

## 2023-03-24 DIAGNOSIS — L8990 Pressure ulcer of unspecified site, unspecified stage: Secondary | ICD-10-CM | POA: Insufficient documentation

## 2023-03-24 NOTE — ED Triage Notes (Signed)
BIBA from [facility], GI bleed x 1 week. Pt has been refusing to come to the hospital. Dark red rectal bleeding.  82/54 BP 96% 2lpm O2 96 HR 169 cbg

## 2023-03-24 NOTE — ED Provider Notes (Addendum)
[place] EMERGENCY DEPARTMENT AT [facility] Provider Note   CSN: [id number] Arrival date & time: 03/24/23  2230     History {Add pertinent medical, surgical, social history, OB history to HPI:1} Chief Complaint  Patient presents with   Rectal Bleeding    Charles Mckinney is a 72 y.o. male.  HPI     This is a 72 year old male who comes in with concern for GI bleed.  He was brought in by EMS with concern for rectal bleeding x 1 week.  He lives at [facility].  Reportedly had dark red blood in his diaper multiple occasions.  He refused coming to the hospital earlier.  Patient states he is not having any abdominal pain.  He does not look at his stools so he cannot tell me what color they are.  He is on Eliquis.  I have reviewed his transfer documentation.  Hemoglobin 8.4.  I further reviewed the patient's chart.  No prior endoscopies or colonoscopies noted.  He did have an echocardiogram with reduced EF of 30 to 35% in 2019.  Home Medications Prior to Admission medications   Medication Sig Start Date End Date Taking? Authorizing Provider  albuterol (PROAIR HFA) 108 (90 Base) MCG/ACT inhaler INHALE 1 PUFF INTO THE LUNGS EVERY 6 HOURS AS NEEDED FOR WHEEZING OR SHORTNESS OF BREATH 10/31/17   [name], MD  albuterol (PROVENTIL) (2.5 MG/3ML) 0.083% nebulizer solution USE ONE VIAL USING NEBULIZER EVERY 6 (SIX) HOURS AS NEEDED FOR WHEEZING OR SHORTNESS OF BREATH. Patient taking differently: Take 2.5 mg by nebulization every 6 (six) hours as needed for wheezing or shortness of breath.  06/19/18   [name], MD  allopurinol (ZYLOPRIM) 100 MG tablet Take 200 mg by mouth daily.    [provider]  apixaban (ELIQUIS) 5 MG TABS tablet Take 1 tablet (5 mg total) by mouth 2 (two) times daily. 07/25/19   Melene Plan, MD  fluticasone (FLONASE) 50 MCG/ACT nasal spray Place 1 spray into both nostrils daily.    [provider]  Fluticasone-Salmeterol  (ADVAIR) 250-50 MCG/DOSE AEPB Inhale 1 puff into the lungs 2 (two) times daily.    [provider]  furosemide (LASIX) 80 MG tablet Take 1 tablet by mouth 2 (two) times daily. 08/16/19   [provider]  levothyroxine (SYNTHROID) 100 MCG tablet Take 1 tablet by mouth daily. 09/10/19   [provider]  metoprolol succinate (TOPROL-XL) 25 MG 24 hr tablet Take 12.5 mg by mouth daily. 08/21/19   [provider]  nitroGLYCERIN (NITROSTAT) 0.4 MG SL tablet Place 0.4 mg under the tongue every 5 (five) minutes as needed for chest pain.    [provider]  potassium chloride SA (K-DUR) 20 MEQ tablet Take 20 mEq by mouth daily.    [provider]  pravastatin (PRAVACHOL) 40 MG tablet TAKE ONE TABLET BY MOUTH ONCE DAILY Patient taking differently: Take 40 mg by mouth daily.  01/16/19   Marykay Lex, MD      Allergies    Niacin and related, Xarelto [rivaroxaban], and Penicillins    Review of Systems   Review of Systems  Gastrointestinal:  Positive for blood in stool. Negative for abdominal pain, nausea and vomiting.  All other systems reviewed and are negative.   Physical Exam Updated Vital Signs BP 114/75   Pulse (!) 120   Temp 98.1 F (36.7 C) (Oral)   Resp 20   SpO2 98%  Physical Exam Vitals and  nursing note reviewed.  Constitutional:      Appearance: He is well-developed. He is obese.     Comments: Pale, chronically ill-appearing, no acute distress  HENT:     Head: Normocephalic and atraumatic.     Nose: Nose normal.     Mouth/Throat:     Mouth: Mucous membranes are dry.  Eyes:     Pupils: Pupils are equal, round, and reactive to light.  Cardiovascular:     Rate and Rhythm: Normal rate and regular rhythm.     Heart sounds: Normal heart sounds. No murmur heard. Pulmonary:     Effort: Pulmonary effort is normal. No respiratory distress.     Breath sounds: Normal breath sounds. No wheezing.     Comments: Nasal cannula in  place Abdominal:     Palpations: Abdomen is soft.     Tenderness: There is no abdominal tenderness. There is no rebound.  Musculoskeletal:     Cervical back: Neck supple.  Lymphadenopathy:     Cervical: No cervical adenopathy.  Skin:    General: Skin is warm and dry.  Neurological:     Mental Status: He is alert and oriented to person, place, and time.  Psychiatric:        Mood and Affect: Mood normal.     ED Results / Procedures / Treatments   Labs (all labs ordered are listed, but only abnormal results are displayed) Labs Reviewed  COMPREHENSIVE METABOLIC PANEL  CBC  POC OCCULT BLOOD, ED  TYPE AND SCREEN    EKG None  Radiology No results found.  Procedures Procedures  {Document cardiac monitor, telemetry assessment procedure when appropriate:1}  Medications Ordered in ED Medications - No data to display  ED Course/ Medical Decision Making/ A&P   {   Click here for ABCD2, HEART and other calculatorsREFRESH Note before signing :1}                          Medical Decision Making Amount and/or Complexity of Data Reviewed Labs: ordered.   ***  {Document critical care time when appropriate:1} {Document review of labs and clinical decision tools ie heart score, Chads2Vasc2 etc:1}  {Document your independent review of radiology images, and any outside records:1} {Document your discussion with family members, caretakers, and with consultants:1} {Document social determinants of health affecting pt's care:1} {Document your decision making why or why not admission, treatments were needed:1} Final Clinical Impression(s) / ED Diagnoses Final diagnoses:  None    Rx / DC Orders ED Discharge Orders     None

## 2023-03-25 ENCOUNTER — Encounter (HOSPITAL_COMMUNITY): Payer: Self-pay | Admitting: Family Medicine

## 2023-03-25 DIAGNOSIS — D649 Anemia, unspecified: Secondary | ICD-10-CM | POA: Diagnosis present

## 2023-03-25 DIAGNOSIS — R531 Weakness: Secondary | ICD-10-CM | POA: Diagnosis not present

## 2023-03-25 DIAGNOSIS — R748 Abnormal levels of other serum enzymes: Secondary | ICD-10-CM | POA: Diagnosis present

## 2023-03-25 DIAGNOSIS — E662 Morbid (severe) obesity with alveolar hypoventilation: Secondary | ICD-10-CM | POA: Diagnosis present

## 2023-03-25 DIAGNOSIS — R7881 Bacteremia: Secondary | ICD-10-CM | POA: Diagnosis not present

## 2023-03-25 DIAGNOSIS — Z515 Encounter for palliative care: Secondary | ICD-10-CM | POA: Diagnosis not present

## 2023-03-25 DIAGNOSIS — K760 Fatty (change of) liver, not elsewhere classified: Secondary | ICD-10-CM | POA: Diagnosis present

## 2023-03-25 DIAGNOSIS — N179 Acute kidney failure, unspecified: Secondary | ICD-10-CM | POA: Diagnosis present

## 2023-03-25 DIAGNOSIS — L89312 Pressure ulcer of right buttock, stage 2: Secondary | ICD-10-CM | POA: Diagnosis present

## 2023-03-25 DIAGNOSIS — A4151 Sepsis due to Escherichia coli [E. coli]: Secondary | ICD-10-CM | POA: Diagnosis present

## 2023-03-25 DIAGNOSIS — K254 Chronic or unspecified gastric ulcer with hemorrhage: Secondary | ICD-10-CM | POA: Diagnosis present

## 2023-03-25 DIAGNOSIS — E876 Hypokalemia: Secondary | ICD-10-CM

## 2023-03-25 DIAGNOSIS — R6520 Severe sepsis without septic shock: Secondary | ICD-10-CM | POA: Diagnosis present

## 2023-03-25 DIAGNOSIS — Z6841 Body Mass Index (BMI) 40.0 and over, adult: Secondary | ICD-10-CM | POA: Diagnosis not present

## 2023-03-25 DIAGNOSIS — E871 Hypo-osmolality and hyponatremia: Secondary | ICD-10-CM | POA: Diagnosis present

## 2023-03-25 DIAGNOSIS — I4821 Permanent atrial fibrillation: Secondary | ICD-10-CM | POA: Diagnosis present

## 2023-03-25 DIAGNOSIS — C186 Malignant neoplasm of descending colon: Secondary | ICD-10-CM | POA: Diagnosis not present

## 2023-03-25 DIAGNOSIS — K922 Gastrointestinal hemorrhage, unspecified: Secondary | ICD-10-CM | POA: Diagnosis present

## 2023-03-25 DIAGNOSIS — R410 Disorientation, unspecified: Secondary | ICD-10-CM | POA: Diagnosis not present

## 2023-03-25 DIAGNOSIS — I82403 Acute embolism and thrombosis of unspecified deep veins of lower extremity, bilateral: Secondary | ICD-10-CM | POA: Diagnosis not present

## 2023-03-25 DIAGNOSIS — K625 Hemorrhage of anus and rectum: Secondary | ICD-10-CM | POA: Diagnosis not present

## 2023-03-25 DIAGNOSIS — I130 Hypertensive heart and chronic kidney disease with heart failure and stage 1 through stage 4 chronic kidney disease, or unspecified chronic kidney disease: Secondary | ICD-10-CM | POA: Diagnosis present

## 2023-03-25 DIAGNOSIS — I5042 Chronic combined systolic (congestive) and diastolic (congestive) heart failure: Secondary | ICD-10-CM | POA: Diagnosis not present

## 2023-03-25 DIAGNOSIS — I959 Hypotension, unspecified: Secondary | ICD-10-CM | POA: Diagnosis present

## 2023-03-25 DIAGNOSIS — D6869 Other thrombophilia: Secondary | ICD-10-CM | POA: Diagnosis present

## 2023-03-25 DIAGNOSIS — I4819 Other persistent atrial fibrillation: Secondary | ICD-10-CM

## 2023-03-25 DIAGNOSIS — G4733 Obstructive sleep apnea (adult) (pediatric): Secondary | ICD-10-CM

## 2023-03-25 DIAGNOSIS — J449 Chronic obstructive pulmonary disease, unspecified: Secondary | ICD-10-CM | POA: Diagnosis present

## 2023-03-25 DIAGNOSIS — N1832 Chronic kidney disease, stage 3b: Secondary | ICD-10-CM | POA: Diagnosis present

## 2023-03-25 DIAGNOSIS — D638 Anemia in other chronic diseases classified elsewhere: Secondary | ICD-10-CM | POA: Diagnosis not present

## 2023-03-25 DIAGNOSIS — N1831 Chronic kidney disease, stage 3a: Secondary | ICD-10-CM

## 2023-03-25 DIAGNOSIS — Z01810 Encounter for preprocedural cardiovascular examination: Secondary | ICD-10-CM | POA: Diagnosis not present

## 2023-03-25 DIAGNOSIS — D126 Benign neoplasm of colon, unspecified: Secondary | ICD-10-CM | POA: Diagnosis not present

## 2023-03-25 DIAGNOSIS — I452 Bifascicular block: Secondary | ICD-10-CM | POA: Diagnosis present

## 2023-03-25 DIAGNOSIS — J9611 Chronic respiratory failure with hypoxia: Secondary | ICD-10-CM | POA: Diagnosis present

## 2023-03-25 DIAGNOSIS — Z7189 Other specified counseling: Secondary | ICD-10-CM | POA: Diagnosis not present

## 2023-03-25 DIAGNOSIS — C19 Malignant neoplasm of rectosigmoid junction: Secondary | ICD-10-CM | POA: Diagnosis present

## 2023-03-25 DIAGNOSIS — Z66 Do not resuscitate: Secondary | ICD-10-CM | POA: Diagnosis not present

## 2023-03-25 DIAGNOSIS — K5669 Other partial intestinal obstruction: Secondary | ICD-10-CM | POA: Diagnosis present

## 2023-03-25 DIAGNOSIS — D62 Acute posthemorrhagic anemia: Secondary | ICD-10-CM | POA: Diagnosis present

## 2023-03-25 DIAGNOSIS — E039 Hypothyroidism, unspecified: Secondary | ICD-10-CM | POA: Diagnosis present

## 2023-03-25 DIAGNOSIS — I5043 Acute on chronic combined systolic (congestive) and diastolic (congestive) heart failure: Secondary | ICD-10-CM | POA: Diagnosis present

## 2023-03-25 DIAGNOSIS — I5041 Acute combined systolic (congestive) and diastolic (congestive) heart failure: Secondary | ICD-10-CM | POA: Diagnosis not present

## 2023-03-25 DIAGNOSIS — K259 Gastric ulcer, unspecified as acute or chronic, without hemorrhage or perforation: Secondary | ICD-10-CM | POA: Diagnosis not present

## 2023-03-25 LAB — COMPREHENSIVE METABOLIC PANEL
ALT: 32 U/L (ref 0–44)
ALT: 28 U/L (ref 0–44)
AST: 56 U/L — ABNORMAL HIGH (ref 15–41)
AST: 35 U/L (ref 15–41)
Albumin: 2.1 g/dL — ABNORMAL LOW (ref 3.5–5.0)
Albumin: 2.1 g/dL — ABNORMAL LOW (ref 3.5–5.0)
Alkaline Phosphatase: 409 U/L — ABNORMAL HIGH (ref 38–126)
Alkaline Phosphatase: 366 U/L — ABNORMAL HIGH (ref 38–126)
Anion gap: 11 (ref 5–15)
Anion gap: 14 (ref 5–15)
BUN: 57 mg/dL — ABNORMAL HIGH (ref 8–23)
BUN: 49 mg/dL — ABNORMAL HIGH (ref 8–23)
CO2: 28 mmol/L (ref 22–32)
CO2: 26 mmol/L (ref 22–32)
Calcium: 8.1 mg/dL — ABNORMAL LOW (ref 8.9–10.3)
Calcium: 7.6 mg/dL — ABNORMAL LOW (ref 8.9–10.3)
Chloride: 91 mmol/L — ABNORMAL LOW (ref 98–111)
Chloride: 83 mmol/L — ABNORMAL LOW (ref 98–111)
Creatinine, Ser: 1.9 mg/dL — ABNORMAL HIGH (ref 0.61–1.24)
Creatinine, Ser: 1.69 mg/dL — ABNORMAL HIGH (ref 0.61–1.24)
GFR, Estimated: 37 mL/min — ABNORMAL LOW (ref >60)
GFR, Estimated: 43 mL/min — ABNORMAL LOW (ref >60)
Glucose, Bld: 134 mg/dL — ABNORMAL HIGH (ref 70–99)
Glucose, Bld: 406 mg/dL — ABNORMAL HIGH (ref 70–99)
Potassium: 2.2 mmol/L — CL (ref 3.5–5.1)
Potassium: 2.6 mmol/L — CL (ref 3.5–5.1)
Sodium: 130 mmol/L — ABNORMAL LOW (ref 135–145)
Sodium: 123 mmol/L — ABNORMAL LOW (ref 135–145)
Total Bilirubin: 0.6 mg/dL (ref 0.3–1.2)
Total Bilirubin: 0.7 mg/dL (ref 0.3–1.2)
Total Protein: 6 g/dL — ABNORMAL LOW (ref 6.5–8.1)
Total Protein: 5.7 g/dL — ABNORMAL LOW (ref 6.5–8.1)

## 2023-03-25 LAB — CBC
HCT: 25 % — ABNORMAL LOW (ref 39.0–52.0)
Hemoglobin: 7.4 g/dL — ABNORMAL LOW (ref 13.0–17.0)
MCH: 24.9 pg — ABNORMAL LOW (ref 26.0–34.0)
MCHC: 29.6 g/dL — ABNORMAL LOW (ref 30.0–36.0)
MCV: 84.2 fL (ref 80.0–100.0)
Platelets: 379 10*3/uL (ref 150–400)
RBC: 2.97 MIL/uL — ABNORMAL LOW (ref 4.22–5.81)
RDW: 16.2 % — ABNORMAL HIGH (ref 11.5–15.5)
WBC: 12.1 10*3/uL — ABNORMAL HIGH (ref 4.0–10.5)
nRBC: 0 % (ref 0.0–0.2)

## 2023-03-25 LAB — PREPARE RBC (CROSSMATCH)

## 2023-03-25 LAB — MAGNESIUM
Magnesium: 9.7 mg/dL (ref 1.7–2.4)
Magnesium: 2.4 mg/dL (ref 1.7–2.4)

## 2023-03-25 LAB — HEMATOCRIT
HCT: 28.8 % — ABNORMAL LOW (ref 39.0–52.0)
HCT: 31.5 % — ABNORMAL LOW (ref 39.0–52.0)

## 2023-03-25 LAB — TYPE AND SCREEN

## 2023-03-25 LAB — BASIC METABOLIC PANEL
Anion gap: 12 (ref 5–15)
BUN: 51 mg/dL — ABNORMAL HIGH (ref 8–23)
CO2: 29 mmol/L (ref 22–32)
Calcium: 8 mg/dL — ABNORMAL LOW (ref 8.9–10.3)
Chloride: 92 mmol/L — ABNORMAL LOW (ref 98–111)
Creatinine, Ser: 1.54 mg/dL — ABNORMAL HIGH (ref 0.61–1.24)
GFR, Estimated: 48 mL/min — ABNORMAL LOW (ref >60)
Glucose, Bld: 113 mg/dL — ABNORMAL HIGH (ref 70–99)
Potassium: 2.6 mmol/L — CL (ref 3.5–5.1)
Sodium: 133 mmol/L — ABNORMAL LOW (ref 135–145)

## 2023-03-25 LAB — HEMOGLOBIN: Hemoglobin: 8.5 g/dL — ABNORMAL LOW (ref 13.0–17.0)

## 2023-03-25 LAB — HEMOGLOBIN AND HEMATOCRIT, BLOOD
HCT: 31 % — ABNORMAL LOW (ref 39.0–52.0)
Hemoglobin: 9.2 g/dL — ABNORMAL LOW (ref 13.0–17.0)

## 2023-03-25 LAB — MRSA NEXT GEN BY PCR, NASAL: MRSA by PCR Next Gen: NOT DETECTED

## 2023-03-25 LAB — BPAM RBC: Unit Type and Rh: 600

## 2023-03-25 LAB — SODIUM, URINE, RANDOM: Sodium, Ur: 62 mmol/L

## 2023-03-25 LAB — GAMMA GT: GGT: 611 U/L — ABNORMAL HIGH (ref 7–50)

## 2023-03-25 LAB — POTASSIUM: Potassium: 2.3 mmol/L — CL (ref 3.5–5.1)

## 2023-03-25 MED ORDER — POTASSIUM CHLORIDE CRYS ER 20 MEQ PO TBCR
40.0000 meq | EXTENDED_RELEASE_TABLET | ORAL | Status: AC
  Administered 2023-03-25 (×2): 40 meq via ORAL
  Filled 2023-03-25 (×2): qty 2

## 2023-03-25 MED ORDER — ACETAMINOPHEN 650 MG RE SUPP: 650.0000 mg | Freq: Four times a day (QID) | RECTAL | Status: DC | PRN

## 2023-03-25 MED ORDER — ACETAMINOPHEN 325 MG PO TABS
650.0000 mg | ORAL_TABLET | Freq: Four times a day (QID) | ORAL | Status: DC | PRN
  Administered 2023-03-25 – 2023-04-06 (×7): 650 mg via ORAL
  Filled 2023-03-25 (×7): qty 2

## 2023-03-25 MED ORDER — SODIUM CHLORIDE 0.9% IV SOLUTION: Freq: Once | INTRAVENOUS | Status: AC

## 2023-03-25 MED ORDER — SODIUM CHLORIDE 0.9 % IV SOLN: INTRAVENOUS | Status: DC

## 2023-03-25 MED ORDER — SODIUM CHLORIDE 0.9% FLUSH
3.0000 mL | Freq: Two times a day (BID) | INTRAVENOUS | Status: DC
  Administered 2023-03-25 – 2023-04-08 (×28): 3 mL via INTRAVENOUS

## 2023-03-25 MED ORDER — POTASSIUM CHLORIDE CRYS ER 20 MEQ PO TBCR
40.0000 meq | EXTENDED_RELEASE_TABLET | Freq: Once | ORAL | Status: AC
  Administered 2023-03-25: 40 meq via ORAL
  Filled 2023-03-25: qty 2

## 2023-03-25 MED ORDER — PEG 3350-KCL-NA BICARB-NACL 420 G PO SOLR
4000.0000 mL | Freq: Once | ORAL | Status: AC
  Administered 2023-03-25: 4000 mL via ORAL
  Filled 2023-03-25: qty 4000

## 2023-03-25 MED ORDER — PANTOPRAZOLE SODIUM 40 MG IV SOLR
40.0000 mg | Freq: Two times a day (BID) | INTRAVENOUS | Status: DC
  Administered 2023-03-25 – 2023-04-07 (×27): 40 mg via INTRAVENOUS
  Filled 2023-03-25 (×28): qty 10

## 2023-03-25 MED ORDER — ALBUTEROL SULFATE (2.5 MG/3ML) 0.083% IN NEBU
3.0000 mL | INHALATION_SOLUTION | RESPIRATORY_TRACT | Status: DC | PRN
  Administered 2023-03-28 – 2023-04-08 (×9): 3 mL via RESPIRATORY_TRACT
  Filled 2023-03-25 (×11): qty 3

## 2023-03-25 MED ORDER — POTASSIUM CHLORIDE 10 MEQ/100ML IV SOLN
10.0000 meq | INTRAVENOUS | Status: AC
  Administered 2023-03-25 (×3): 10 meq via INTRAVENOUS
  Filled 2023-03-25 (×3): qty 100

## 2023-03-25 MED ORDER — MAGNESIUM SULFATE IN D5W 1-5 GM/100ML-% IV SOLN
1.0000 g | Freq: Once | INTRAVENOUS | Status: AC
  Administered 2023-03-25: 1 g via INTRAVENOUS
  Filled 2023-03-25 (×2): qty 100

## 2023-03-25 MED ORDER — LEVOTHYROXINE SODIUM 100 MCG PO TABS
100.0000 ug | ORAL_TABLET | Freq: Every day | ORAL | Status: DC
  Administered 2023-03-25 – 2023-04-08 (×14): 100 ug via ORAL
  Filled 2023-03-25 (×14): qty 1

## 2023-03-25 MED ORDER — MOMETASONE FURO-FORMOTEROL FUM 200-5 MCG/ACT IN AERO
2.0000 | INHALATION_SPRAY | Freq: Two times a day (BID) | RESPIRATORY_TRACT | Status: DC
  Administered 2023-03-25 – 2023-04-08 (×27): 2 via RESPIRATORY_TRACT
  Filled 2023-03-25 (×2): qty 8.8

## 2023-03-25 MED ORDER — POTASSIUM CHLORIDE 10 MEQ/100ML IV SOLN
10.0000 meq | INTRAVENOUS | Status: AC
  Administered 2023-03-25 (×6): 10 meq via INTRAVENOUS
  Filled 2023-03-25 (×6): qty 100

## 2023-03-25 NOTE — H&P (Signed)
History and Physical    Charles Mckinney ZOX:[id number] DOB: 1951-08-04 DOA: 03/24/2023  PCP: Virl Cagey, MD   Patient coming from: SNF  Chief Complaint: Blood in diaper   HPI: Charles Mckinney is a 72 y.o. male with medical history significant for hypertension, COPD, chronic hypoxic respiratory failure, CKD 3A, atrial fibrillation on Eliquis, chronic combined systolic and diastolic CHF, OSA, and AAA who presents to the emergency department for evaluation of GI bleeding.  Patient was reportedly noted to have dark red blood in his diaper multiple times over the past week but had been refusing transport to the emergency department for this.  He had labs performed at his SNF yesterday which were notable for potassium 2.6, hemoglobin 8.4, and creatinine 1.37.   Patient denies abdominal pain, nausea, or vomiting.  He reports increasing fatigue.  He was noted to have blood pressure of 82/54 with EMS.  ED Course: Upon arrival to the ED, patient is found to be afebrile and saturating well on 2 L/min of supplemental oxygen with tachycardia and systolic blood pressure of 97 and greater.  Labs are most notable for WBC 12,100, hemoglobin 7.4, sodium 130, potassium 2.2, BUN 57, creatinine 1.90, alkaline phosphatase 209, and albumin 2.1.  GI (Dr. Marca Ancona) was consulted by the ED physician, type and screen was performed, 30 mill equivalents of IV potassium was administered, and 2 units of RBC were ordered for transfusion.  Review of Systems:  All other systems reviewed and apart from HPI, are negative.  Past Medical History:  Diagnosis Date   AAA (abdominal aortic aneurysm) without rupture 03/14/2019   Anxiety    CAD, multiple vessel 04/2015   Right and Left Heart Catheterization Apr 09, 2015: LM 95%; ostLAD 99% - p-dLAD 80%, D1 99% - D2 80%; OCx 90%,pCx 50%; pRCA 80%, mRCA 50%, rPDA 80% & 90%. --> CABG x 4: LIMA-LAD, SVG-Diag, SeqSVG-rPL-rPDA --> Intra-Op TEE showed EF of 30% with only mild MR.     Chronic combined systolic and diastolic CHF, NYHA class 3 01/19/2015   Chronic combined systolic and diastolic heart failure, NYHA class 3 01/2014   EF is been ranging from 30-35% since February 2015.  Suggestion of anterior infarct on Myoview/echo.   CKD (chronic kidney disease), stage III 05/29/2017   Coronary artery disease involving native coronary artery of native heart with angina pectoris 02/02/2016   Cough    Essential hypertension 05/30/2017   GERD 11/22/2007   GERD (gastroesophageal reflux disease)    Gout 05/30/2017   Hyperlipidemia    Hyperlipidemia with target LDL less than 70    Hypertension    Hypertensive heart disease    Mild to moderately dilated LV with mild LVH.   Hypothyroidism due to medication    Ischemic cardiomyopathy 01/2014   EF 30-35%.  Severe multivessel CAD noted on cath--CABG x4 -> Myoview (June 2018) shows large anterior-septal-apical infarct with no ischemia.   Kidney stones    Long term current use of anticoagulant therapy 01/19/2015   Morbid obesity    Obesity (BMI 30-39.9)    OSA (obstructive sleep apnea) 10/04/2017   Osteoarthritis    Permanent atrial fibrillation    On Amiodarone & Beta Blocker (on Xarelto)   Persistent atrial fibrillation (HCC): CHA2DS2Vasc 6    Cardioversion 01/19/15 and amiodarone begun  This patients CHA2DS2-VASc Score and unadjusted Ischemic Stroke Rate (% per year) is equal to 9.7 % stroke rate/year from a score of 6  Above score calculated as 1 point  each if present [CHF, HTN, DM, Vascular=MI/PAD/Aortic Plaque, Age if 65-74, or Male] Above score calculated as 2 points each if present [Age > 75, or Stroke/TIA/TE]    PONV (postoperative nausea and vomiting)    Prolonged QT interval 04/26/2017   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   CARDIOVERSION N/A 01/19/2015   Procedure: CARDIOVERSION;  Surgeon: Othella Boyer, MD;  Location: [facility] ENDOSCOPY;  Service: Cardiovascular;  Laterality: N/A;  pt in Afib, 12:22 synched cardioversion @  120 joules using Propofol 100 mg,IV....unsuccessful, repeated at 200 joules   CARDIOVERSION N/A 04/02/2015   Procedure: CARDIOVERSION;  Surgeon: Orpah Cobb, MD;  Location: [facility] ENDOSCOPY;  Service: Cardiovascular;  Laterality: N/A;   CATARACT EXTRACTION     CLIPPING OF ATRIAL APPENDAGE  04/13/2015   Procedure: CLIPPING OF ATRIAL APPENDAGE;  Surgeon: Alleen Borne, MD;  Location: [facility] OR;  Service: Open Heart Surgery;;   CORONARY ARTERY BYPASS GRAFT N/A 04/13/2015   Procedure: CORONARY ARTERY BYPASS GRAFTING (CABG);  Surgeon: Alleen Borne, MD;  Location: [facility] OR;  Service: Open Heart Surgery;; LIMA-LAD, SVG-Diag, SeqSVG-rPL-rPDA    NM MYOVIEW LTD  05/2017   EF 27%.  No reversible ischemia.  Large anterior-apical and septal infarct.  Diffuse LV HK and moderate LV dilation.   TEE WITHOUT CARDIOVERSION N/A 04/13/2015   Procedure: TRANSESOPHAGEAL ECHOCARDIOGRAM (TEE);  Surgeon: Alleen Borne, MD;  Location: [facility] OR;  Service: Open Heart Surgery;  Laterality: N/A;   TRANSTHORACIC ECHOCARDIOGRAM  2/'15, 2/'16, 4/'16   a) EF 30-35%.  Mild LVH.  GRII DD.  Mild LA dilation.  Moderate diastolic dysfunction. b) Technically difficult.  Mild LVH.  EF 35%.  Diffuse HK.  Moderate LA dilation.  Mildly dilated RV with mildly reduced function.  c)   Mild concentric LVH.  EF 30-35%, diffuse HK.  Severe HK of mid apical inferior wall.  Moderate LA dilation.   TRANSTHORACIC ECHOCARDIOGRAM N/A 1/18, 5/18, 7/18   a) Moderate severely reduced LV.  EF 30 of 35% with diffuse HK.  Mild MR.  Mildly dilated RA.;; b) (In setting of TIA).  Technically difficult mild to moderate globally  reduced LV function.  Mild LVH.  Mild MR.  EF estimated 40-45%.  Moderate LA dilation.;; c)  Afib. Mild to mod dilated LV with mild LVH. EF 30-35% with diffuse HK. Mildly dilated RV with mildly decrease Systolic Fxn. Mild MR    Social History:   reports that he quit smoking about 24 years ago. He has never used smokeless tobacco. He reports that he does not drink alcohol and does not use drugs.  Allergies  Allergen Reactions   Niacin And Related Other (See Comments)    FLUSHING   Xarelto [Rivaroxaban] Other (See Comments)    Cause bloody stools. Switched to eliquis low dose due to bleeding.    Penicillins Itching, Rash and Other (See Comments)    Has patient had a PCN reaction causing immediate rash, facial/tongue/throat swelling, SOB or lightheadedness with hypotension: Yes Has patient had a PCN reaction causing severe rash involving mucus membranes or skin necrosis: No Has patient had  a PCN reaction that required hospitalization: No Has patient had a PCN reaction occurring within the last 10 years: No If all of the above answers are "NO", then may proceed with Cephalosporin use.     Family History  Problem Relation Age of Onset   Diabetes Mother    Heart disease Father    Congestive Heart Failure Sister    Stroke Brother    Heart disease Brother    Diabetes Brother      Prior to Admission medications   Medication Sig Start Date End Date Taking? Authorizing Provider  albuterol (PROAIR HFA) 108 (90 Base) MCG/ACT inhaler INHALE 1 PUFF INTO THE LUNGS EVERY 6 HOURS AS NEEDED FOR WHEEZING OR SHORTNESS OF BREATH 10/31/17   [name], MD  albuterol (PROVENTIL) (2.5 MG/3ML) 0.083% nebulizer solution USE ONE VIAL USING NEBULIZER EVERY 6 (SIX) HOURS AS NEEDED FOR WHEEZING OR SHORTNESS OF BREATH. Patient taking differently: Take 2.5 mg by nebulization every 6 (six) hours as needed for wheezing or shortness of breath.  06/19/18   [name], MD  allopurinol (ZYLOPRIM)  100 MG tablet Take 200 mg by mouth daily.    [provider]  apixaban (ELIQUIS) 5 MG TABS tablet Take 1 tablet (5 mg total) by mouth 2 (two) times daily. 07/25/19   Melene Plan, MD  fluticasone (FLONASE) 50 MCG/ACT nasal spray Place 1 spray into both nostrils daily.    [provider]  Fluticasone-Salmeterol (ADVAIR) 250-50 MCG/DOSE AEPB Inhale 1 puff into the lungs 2 (two) times daily.    [provider]  furosemide (LASIX) 80 MG tablet Take 1 tablet by mouth 2 (two) times daily. 08/16/19   [provider]  levothyroxine (SYNTHROID) 100 MCG tablet Take 1 tablet by mouth daily. 09/10/19   [provider]  metoprolol succinate (TOPROL-XL) 25 MG 24 hr tablet Take 12.5 mg by mouth daily. 08/21/19   [provider]  nitroGLYCERIN (NITROSTAT) 0.4 MG SL tablet Place 0.4 mg under the tongue every 5 (five) minutes as needed for chest pain.    [provider]  potassium chloride SA (K-DUR) 20 MEQ tablet Take 20 mEq by mouth daily.    [provider]  pravastatin (PRAVACHOL) 40 MG tablet TAKE ONE TABLET BY MOUTH ONCE DAILY Patient taking differently: Take 40 mg by mouth daily.  01/16/19   Marykay Lex, MD    Physical Exam: Vitals:   03/25/23 0200 03/25/23 0230 03/25/23 0330 03/25/23 0400  BP: (!) 120/56 120/72 112/72 105/74  Pulse: 83 74 100 69  Resp: 17 16 19 19   Temp:   98.5 F (36.9 C) 98.5 F (36.9 C)  TempSrc:   Oral Oral  SpO2: 98% 97% 97% 98%    Constitutional: NAD, no diaphoresis   Eyes: PERTLA, lids and conjunctivae normal ENMT: Mucous membranes are moist. Posterior pharynx clear of any exudate or lesions.   Neck: supple, no masses  Respiratory: no wheezing, no crackles. No accessory muscle use.  Cardiovascular: S1 & S2 heard, regular rate and rhythm. No JVD. Abdomen: No distension, no tenderness, soft. Bowel sounds active.  Musculoskeletal: no clubbing / cyanosis. No joint deformity upper and lower extremities.    Skin: Feet wrapped b/l. Warm, dry, well-perfused. Neurologic: No dysarthria or aphasia, EOMI. Moving all extremities. Alert and oriented.  Psychiatric: Calm. Cooperative.    Labs and Imaging on Admission: I have personally reviewed following labs and imaging studies  CBC: Recent Labs  Lab 03/24/23 2255  WBC  12.1*  HGB 7.4*  HCT 25.0*  MCV 84.2  PLT 379   Basic Metabolic Panel: Recent Labs  Lab 03/24/23 2255  NA 130*  K 2.2*  CL 91*  CO2 28  GLUCOSE 134*  BUN 57*  CREATININE 1.90*  CALCIUM 8.1*   GFR: CrCl cannot be calculated (Unknown ideal weight.). Liver Function Tests: Recent Labs  Lab 03/24/23 2255  AST 56*  ALT 32  ALKPHOS 409*  BILITOT 0.6  PROT 6.0*  ALBUMIN 2.1*   No results for input(s): "LIPASE", "AMYLASE" in the last 168 hours. No results for input(s): "AMMONIA" in the last 168 hours. Coagulation Profile: No results for input(s): "INR", "PROTIME" in the last 168 hours. Cardiac Enzymes: No results for input(s): "CKTOTAL", "CKMB", "CKMBINDEX", "TROPONINI" in the last 168 hours. BNP (last 3 results) No results for input(s): "PROBNP" in the last 8760 hours. HbA1C: No results for input(s): "HGBA1C" in the last 72 hours. CBG: No results for input(s): "GLUCAP" in the last 168 hours. Lipid Profile: No results for input(s): "CHOL", "HDL", "LDLCALC", "TRIG", "CHOLHDL", "LDLDIRECT" in the last 72 hours. Thyroid Function Tests: No results for input(s): "TSH", "T4TOTAL", "FREET4", "T3FREE", "THYROIDAB" in the last 72 hours. Anemia Panel: No results for input(s): "VITAMINB12", "FOLATE", "FERRITIN", "TIBC", "IRON", "RETICCTPCT" in the last 72 hours. Urine analysis:    Component Value Date/Time   COLORURINE COLORLESS (A) 07/15/2019 1758   APPEARANCEUR CLEAR 07/15/2019 1758   LABSPEC 1.005 07/15/2019 1758   PHURINE 6.0 07/15/2019 1758   GLUCOSEU NEGATIVE 07/15/2019 1758   HGBUR SMALL (A) 07/15/2019 1758   BILIRUBINUR NEGATIVE 07/15/2019 1758    BILIRUBINUR n 01/23/2018 1539   KETONESUR NEGATIVE 07/15/2019 1758   PROTEINUR NEGATIVE 07/15/2019 1758   UROBILINOGEN 0.2 01/23/2018 1539   UROBILINOGEN 0.2 09/21/2015 1557   NITRITE NEGATIVE 07/15/2019 1758   LEUKOCYTESUR NEGATIVE 07/15/2019 1758   Sepsis Labs: @LABRCNTIP (procalcitonin:4,lacticidven:4) )No results found for this or any previous visit (from the past 240 hour(s)).   Radiological Exams on Admission: No results found.  EKG: Independently reviewed. Atrial fibrillation, PVC, RBBB, LAFB.   Assessment/Plan   1. GI bleeding; symptomatic anemia  - Sent for dark red blood in diaper x1 wk and noted to have Hgb 7.4 (8.4 the day prior)  - ED physician consulted GI and ordered 2 units RBC for transfusion  - Hold Eliquis, trend H&H, transfuse additional RBC if needed, follow-up on GI recommendations    2. AKI superimposed on CKD IIIa  - SCr is 1.90 on admission, up from 1.37 yesterday  - Likely prerenal in setting of blood loss and low BP  - Hold antihypertensives and diuretics, transfuse RBC as above, renally-dose medications, repeat chem panel in am  3. Chronic combined systolic & diastolic CHF  - Appears compensated  - Holding diuretics initially given blood loss with hypotension and AKI, monitor wt and I/Os   4. Atrial fibrillation  - Hold Eliquis and metoprolol for now given GIB and low BP with EMS   5. Hypokalemia  - Replacing, will repeat chem panel    6. OSA  - CPAP qHS   7. Elevated alkaline phosphatase  - Check GGT, trend LFTs    8. COPD; chronic hypoxic respiratory failure  - Not in exacerbation on admission  - Continue inhalers, supplemental O2    DVT prophylaxis: SCDs  Code Status: Full  Level of Care: Level of care: Progressive Family Communication: None present   Disposition Plan:  Patient is from: SNF  Anticipated d/c is  to: SNF  Anticipated d/c date is: 03/28/23  Patient currently: Pending GI consultation, stable H&H, stable renal function   Consults called: GI  Admission status: Inpatient     Briscoe Deutscher, MD [facility]  03/25/2023, 4:10 AM

## 2023-03-25 NOTE — ED Notes (Signed)
ED TO INPATIENT HANDOFF REPORT  Name/Age/Gender Charles Mckinney 72 y.o. male  Code Status    Code Status Orders  (From admission, onward)           Start     Ordered   03/25/23 0408  Full code  Continuous       Question:  By:  Answer:  Consent: discussion documented in EHR   03/25/23 0410           Code Status History     Date Active Date Inactive Code Status Order ID Comments User Context   07/15/2019 2048 07/25/2019 1734 Full Code [id number]  Shirley, [place], DO Inpatient   05/29/2017 2143 05/31/2017 2144 Full Code [id number]  Lorretta Harp, MD ED   04/26/2017 1139 04/27/2017 2205 Full Code [id number]  Alberteen Sam, MD Inpatient   12/13/2016 2158 12/15/2016 1417 Full Code [id number]  Jonah Blue, MD Inpatient   04/13/2015 1615 04/22/2015 1153 Full Code [id number]  Rowe Clack, PA-C Inpatient   04/09/2015 1648 04/13/2015 1615 Full Code [id number]  Orpah Cobb, MD Inpatient   04/05/2015 1547 04/09/2015 1648 Full Code [id number]  Rinaldo Cloud, MD Inpatient   04/02/2015 1231 04/02/2015 2151 Full Code [id number]  Orpah Cobb, MD Inpatient   04/01/2015 0103 04/02/2015 1231 Full Code [id number]  Orpah Cobb, MD Inpatient   01/12/2015 1521 01/20/2015 1536 Full Code [id number]  Jerald Kief, MD ED   01/31/2014 2125 02/04/2014 1340 Full Code [id number]  Othella Boyer, MD Inpatient       Home/SNF/Other Skilled nursing facility  Chief Complaint GI bleeding [K92.2]  Level of Care/Admitting Diagnosis ED Disposition     ED Disposition  Admit   Condition  --   Comment  Hospital Area: [facility] [[id number]]  Level of Care: Progressive [102]  Admit to Progressive based on following criteria: GI, ENDOCRINE disease patients with GI bleeding, acute liver failure or pancreatitis, stable with diabetic ketoacidosis or thyrotoxicosis (hypothyroid) state.  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 --> CABG x 4: LIMA-LAD, SVG-Diag, SeqSVG-rPL-rPDA --> Intra-Op TEE showed EF of 30% with only mild MR.    Chronic combined systolic and diastolic CHF, NYHA class 3 01/19/2015   Chronic combined systolic and diastolic heart failure, NYHA class 3 01/2014   EF is been ranging from 30-35% since February 2015.  Suggestion of anterior infarct on Myoview/echo.   CKD (chronic kidney disease), stage III 05/29/2017   Coronary artery disease involving native coronary artery of native heart with angina pectoris 02/02/2016   Cough    Essential hypertension 05/30/2017   GERD 11/22/2007   GERD (gastroesophageal reflux disease)    Gout 05/30/2017   Hyperlipidemia    Hyperlipidemia with  target LDL less than 70    Hypertension    Hypertensive heart disease    Mild to moderately dilated LV with mild LVH.   Hypothyroidism due to medication    Ischemic cardiomyopathy 01/2014   EF 30-35%.  Severe multivessel CAD noted on cath--CABG x4 -> Myoview (June 2018) shows large anterior-septal-apical infarct with no ischemia.   Kidney stones    Long term current use of anticoagulant therapy 01/19/2015   Morbid obesity    Obesity (BMI 30-39.9)    OSA (obstructive sleep apnea) 10/04/2017   Osteoarthritis    Permanent atrial fibrillation    On Amiodarone & Beta Blocker (on Xarelto)    Persistent atrial fibrillation (HCC): CHA2DS2Vasc 6    Cardioversion 01/19/15 and amiodarone begun  This patients CHA2DS2-VASc Score and unadjusted Ischemic Stroke Rate (% per year) is equal to 9.7 % stroke rate/year from a score of 6  Above score calculated as 1 point each if present [CHF, HTN, DM, Vascular=MI/PAD/Aortic Plaque, Age if 65-74, or Male] Above score calculated as 2 points each if present [Age > 75, or Stroke/TIA/TE]    PONV (postoperative nausea and vomiting)    Prolonged QT interval 04/26/2017   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 Switched to eliquis low dose due to bleeding.    Penicillins Itching, Rash and Other (See Comments)    Has patient had a PCN reaction causing immediate rash, facial/tongue/throat swelling, SOB or lightheadedness with hypotension: Yes Has patient had a PCN reaction causing severe rash involving mucus membranes or skin necrosis: No Has patient had a PCN reaction that required hospitalization: No Has patient had a PCN reaction occurring within the last 10 years: No If all of the above answers are "NO", then may proceed with Cephalosporin use.     IV Location/Drains/Wounds Patient Lines/Drains/Airways Status     Active Line/Drains/Airways     Name Placement date Placement time Site Days   Peripheral IV 03/24/23 18 G 1.16" Anterior;Proximal;Right Forearm 03/24/23  2255  Forearm  1   Peripheral IV 03/24/23 20 G 1" Anterior;Right Forearm 03/24/23  2250  Forearm  1   Wound / Incision (Open or Dehisced) 07/16/19 Venous stasis ulcer Leg Left yellow 07/16/19  0900  Leg  1348   Wound / Incision (Open or Dehisced) 07/16/19 Other (Comment) Buttocks Posterior;Medial stage 2 07/16/19  1000  Buttocks  1348            Labs/Imaging Results for orders  placed or performed during the hospital encounter of 03/24/23 (from the past 48 hour(s))  Comprehensive metabolic panel     Status: Abnormal   Collection Time: 03/24/23 10:55 PM  Result Value Ref Range   Sodium 130 (L) 135 - 145 mmol/L   Potassium 2.2 (LL) 3.5 - 5.1 mmol/L    Comment: CRITICAL RESULT CALLED TO, READ BACK BY AND VERIFIED WITH JASPER,A AT 0227 ON 03/25/23 BY LUZOLOP    Chloride 91 (L) 98 - 111 mmol/L   CO2 28 22 - 32 mmol/L   Glucose, Bld 134 (H) 70 - 99 mg/dL    Comment: Glucose reference range applies only to samples taken after fasting for at least 8 hours.   BUN 57 (H) 8 - 23 mg/dL   Creatinine, Ser 1.61 (H) 0.61 -  1.24 mg/dL   Calcium 8.1 (L) 8.9 - 10.3 mg/dL   Total Protein 6.0 (L) 6.5 - 8.1 g/dL   Albumin 2.1 (L) 3.5 - 5.0 g/dL   AST 56 (H) 15 - 41 U/L   ALT 32 0 - 44 U/L   Alkaline Phosphatase 409 (H) 38 - 126 U/L   Total Bilirubin 0.6 0.3 - 1.2 mg/dL   GFR, Estimated 37 (L) >60 mL/min    Comment: (NOTE) Calculated using the CKD-EPI Creatinine Equation (2021)    Anion gap 11 5 - 15    Comment: Performed at [facility], [address] [place], [place] [address]  CBC     Status: Abnormal   Collection Time: 03/24/23 10:55 PM  Result Value Ref Range   WBC 12.1 (H) 4.0 - 10.5 K/uL   RBC 2.97 (L) 4.22 - 5.81 MIL/uL   Hemoglobin 7.4 (L) 13.0 - 17.0 g/dL   HCT 91.4 (L) 78.2 - 95.6 %   MCV 84.2 80.0 - 100.0 fL   MCH 24.9 (L) 26.0 - 34.0 pg   MCHC 29.6 (L) 30.0 - 36.0 g/dL   RDW 21.3 (H) 08.6 - 57.8 %   Platelets 379 150 - 400 K/uL   nRBC 0.0 0.0 - 0.2 %    Comment: Performed at [facility], [address] [place], [place] [address]  Type and screen Ordered by PROVIDER DEFAULT     Status: None (Preliminary result)   Collection Time: 03/24/23 11:54 PM  Result Value Ref Range   ABO/RH(D) A NEG    Antibody Screen NEG    Sample Expiration 03/27/2023,2359    Unit Number [id number]    Blood Component Type RED  CELLS,LR    Unit division 00    Status of Unit ISSUED    Transfusion Status OK TO TRANSFUSE    Crossmatch Result      Compatible Performed at [facility], [address], [place], [place] [address]    Unit Number [id number]    Blood Component Type RED CELLS,LR    Unit division 00    Status of Unit ISSUED    Transfusion Status OK TO TRANSFUSE    Crossmatch Result Compatible   Prepare RBC (crossmatch)     Status: None   Collection Time: 03/25/23 12:10 AM  Result Value Ref Range   Order Confirmation      ORDER PROCESSED BY BLOOD BANK Performed at [facility], [address], [place], [place] [address]   Gamma GT     Status: Abnormal   Collection Time: 03/25/23  6:00 AM  Result Value Ref Range   GGT 611 (H) 7 - 50 U/L    Comment: Performed at [facility], [address], [place], [place] [address]  Comprehensive metabolic panel     Status: Abnormal   Collection Time: 03/25/23  6:00 AM  Result Value Ref Range   Sodium 123 (L) 135 - 145 mmol/L    Comment: DELTA CHECK NOTED   Potassium 2.6 (LL) 3.5 - 5.1 mmol/L    Comment: CRITICAL RESULT CALLED TO, READ BACK BY AND VERIFIED WITH SAVOIE, B RN AT [phone] 03/25/23 BY TIBBITTS,K    Chloride 83 (L) 98 - 111 mmol/L   CO2 26 22 - 32 mmol/L   Glucose, Bld 406 (H) 70 - 99 mg/dL    Comment: Glucose reference range applies only to samples taken after fasting for at least 8 hours.  BUN 49 (H) 8 - 23 mg/dL   Creatinine, Ser 1.69 (H) 0.61 - 1.24 mg/dL   Calcium 7.6 (L) 8.9 - 10.3 mg/dL   Total Protein 5.7 (L) 6.5 - 8.1 g/dL   Albumin 2.1 (L) 3.5 - 5.0 g/dL  1.61AST 35 15 - 41 U/L   ALT 28 0 - 44 U/L   Alkaline Phosphatase 366 (H) 38 - 126 U/L   Total Bilirubin 0.7 0.3 - 1.2 mg/dL   GFR, Estimated 43 (L) >60 mL/min    Comment: (NOTE) Calculated using the CKD-EPI Creatinine Equation (2021)    Anion gap 14 5 - 15    Comment: Performed at [facility], [address]  [place], [place] [address]  Magnesium     Status: Abnormal   Collection Time: 03/25/23  6:00 AM  Result Value Ref Range   Magnesium 9.7 (HH) 1.7 - 2.4 mg/dL    Comment: RESULTS CONFIRMED BY MANUAL DILUTION CRITICAL RESULT CALLED TO, READ BACK BY AND VERIFIED WITH: Derl Barrow RN AT 5409 03/25/23 BY TIBBITTS, K Performed at [facility], [address], [place], [place] [address]   Hemoglobin     Status: Abnormal   Collection Time: 03/25/23  6:00 AM  Result Value Ref Range   Hemoglobin 8.5 (L) 13.0 - 17.0 g/dL    Comment: Performed at [facility], [address] [place], [place] [address]  Hematocrit     Status: Abnormal   Collection Time: 03/25/23  6:00 AM  Result Value Ref Range   HCT 28.8 (L) 39.0 - 52.0 %    Comment: Performed at [facility], [address], [place], [place] [address]  Magnesium     Status: None   Collection Time: 03/25/23  8:59 AM  Result Value Ref Range   Magnesium 2.4 1.7 - 2.4 mg/dL    Comment: Performed at [facility], [address], [place], [place] [address]  Potassium     Status: Abnormal   Collection Time: 03/25/23  9:50 AM  Result Value Ref Range   Potassium 2.3 (LL) 3.5 - 5.1 mmol/L    Comment: CRITICAL RESULT CALLED TO, READ BACK BY AND VERIFIED WITH SINCLAIR, L RN AT 1128 03/25/23 BY TIBBITTS, K Performed at [facility], [address] [place], [place] [address]   Hematocrit     Status: Abnormal   Collection Time: 03/25/23 10:10 AM  Result Value Ref Range   HCT 31.5 (L) 39.0 - 52.0 %    Comment: Performed at [facility], [address], [place], [place] [address]   No results found.  Pending Labs Unresulted Labs (From admission, onward)     Start     Ordered   03/26/23 0500  CBC  Tomorrow morning,   R        03/25/23 0852   03/25/23 1700  Basic metabolic panel  Once,   R        03/25/23 1141   03/25/23 1143  Sodium, urine,  random  Once,   R        03/25/23 1142   03/25/23 0500  Comprehensive metabolic panel  Daily,   R      03/25/23 0410   03/25/23 0410  Hematocrit  Now then every 6 hours,   R (with TIMED occurrences)      03/25/23 0410            Vitals/Pain Today's Vitals  03/25/23 1230 03/25/23 1245 03/25/23 1300 03/25/23 1315  BP: 100/66 100/66 104/86 (!) 104/56  Pulse:  (!) 101 77 80  Resp: Temp:      TempSrc:      SpO2: 92% 97% 97% 96%    Isolation Precautions No active isolations  Medications Medications  levothyroxine (SYNTHROID) tablet 100 mcg (100 mcg Oral Given 03/25/23 0616)  mometasone-formoterol (DULERA) 200-5 MCG/ACT inhaler 2 puff (2 puffs Inhalation Given 03/25/23 0948)  albuterol (PROVENTIL) (2.5 MG/3ML) 0.083% nebulizer solution 3 mL (has no administration in time range)  sodium chloride flush (NS) 0.9 % injection 3 mL (3 mLs Intravenous Given 03/25/23 1114)  acetaminophen (TYLENOL) tablet 650 mg (has no administration in time range)    Or  acetaminophen (TYLENOL) suppository 650 mg (has no administration in time range)  potassium chloride 10 mEq in 100 mL IVPB (10 mEq Intravenous New Bag/Given 03/25/23 1333)  polyethylene glycol-electrolytes (NuLYTELY) solution 4,000 mL (has no administration in time range)  pantoprazole (PROTONIX) injection 40 mg (40 mg Intravenous Given 03/25/23 1229)  0.9 %  sodium chloride infusion ( Intravenous New Bag/Given 03/25/23 1228)  potassium chloride SA (KLOR-CON M) CR tablet 40 mEq (40 mEq Oral Given 03/25/23 1224)  0.9 %  sodium chloride infusion (Manually program via Guardrails IV Fluids) (0 mLs Intravenous Stopped 03/25/23 0615)  potassium chloride 10 mEq in 100 mL IVPB (0 mEq Intravenous Stopped 03/25/23 0558)  magnesium sulfate IVPB 1 g 100 mL (0 g Intravenous Stopped 03/25/23 0700)  potassium chloride SA (KLOR-CON M) CR tablet 40 mEq (40 mEq Oral Given 03/25/23 0455)    Mobility non-ambulatory

## 2023-03-25 NOTE — Consult Note (Signed)
[facility] Gastroenterology Consult  Referring Provider: ER Primary Care Physician:  Virl Cagey, MD Primary Gastroenterologist: Gentry Fitz  Reason for Consultation: Bloody and black stools  HPI: Charles Mckinney is a 72 y.o. male was admitted via ER for GI bleeding. Patient stays at SNF and has been having dark blood and black stools over the course of last 2 months. He was found to have low hemoglobin, complained of increased fatigue and was brought to the ER. Patient states he has mostly been bedbound for the last year or so, is on oxygen via nasal cannula around-the-clock and states he is very debilitated. He has never had an endoscopy or colonoscopy in the past. He states that at nursing home he has been told that his stools have been bloody and black. He has mild generalized abdominal pain. He denies nausea or vomiting. He denies acid reflux or heartburn. He has trouble swallowing pills but denies difficulty with solids and liquids. He denies unintentional weight loss or loss of appetite. He denies family history of colon cancer. He is on Eliquis for atrial fibrillation.   Past Medical History:  Diagnosis Date   AAA (abdominal aortic aneurysm) without rupture 03/14/2019   Anxiety    CAD, multiple vessel 04/2015   Right and Left Heart Catheterization Apr 09, 2015: LM 95%; ostLAD 99% - p-dLAD 80%, D1 99% - D2 80%; OCx 90%,pCx 50%; pRCA 80%, mRCA 50%, rPDA 80% & 90%. --> CABG x 4: LIMA-LAD, SVG-Diag, SeqSVG-rPL-rPDA --> Intra-Op TEE showed EF of 30% with only mild MR.    Chronic combined systolic and diastolic CHF, NYHA class 3 01/19/2015   Chronic combined systolic and diastolic heart failure, NYHA class 3 01/2014   EF is been ranging from 30-35% since February 2015.  Suggestion of anterior infarct on Myoview/echo.   CKD (chronic kidney disease), stage III 05/29/2017   Coronary artery disease involving native coronary artery of native heart with angina pectoris 02/02/2016   Cough     Essential hypertension 05/30/2017   GERD 11/22/2007   GERD (gastroesophageal reflux disease)    Gout 05/30/2017   Hyperlipidemia    Hyperlipidemia with target LDL less than 70    Hypertension    Hypertensive heart disease    Mild to moderately dilated LV with mild LVH.   Hypothyroidism due to medication    Ischemic cardiomyopathy 01/2014   EF 30-35%.  Severe multivessel CAD noted on cath--CABG x4 -> Myoview (June 2018) shows large anterior-septal-apical infarct with no ischemia.   Kidney stones    Long term current use of anticoagulant therapy 01/19/2015   Morbid obesity    Obesity (BMI 30-39.9)    OSA (obstructive sleep apnea) 10/04/2017   Osteoarthritis    Permanent atrial fibrillation    On Amiodarone & Beta Blocker (on Xarelto)   Persistent atrial fibrillation (HCC): CHA2DS2Vasc 6    Cardioversion 01/19/15 and amiodarone begun  This patients CHA2DS2-VASc Score and unadjusted Ischemic Stroke Rate (% per year) is equal to 9.7 % stroke rate/year from a score of 6  Above score calculated as 1 point each if present [CHF, HTN, DM, Vascular=MI/PAD/Aortic Plaque, Age if 65-74, or Male] Above score calculated as 2 points each if present [Age > 75, or Stroke/TIA/TE]    PONV (postoperative nausea and vomiting)    Prolonged QT interval 04/26/2017   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   CARDIOVERSION N/A 01/19/2015   Procedure: CARDIOVERSION;  Surgeon: Othella Boyer, MD;  Location: [facility] ENDOSCOPY;  Service: Cardiovascular;  Laterality: N/A;  pt in Afib, 12:22 synched cardioversion @  120 joules using Propofol 100 mg,IV....unsuccessful, repeated at  200 joules   CARDIOVERSION N/A 04/02/2015   Procedure: CARDIOVERSION;  Surgeon: Orpah Cobb, MD;  Location: [facility] ENDOSCOPY;  Service: Cardiovascular;  Laterality: N/A;   CATARACT EXTRACTION     CLIPPING OF ATRIAL APPENDAGE  04/13/2015   Procedure: CLIPPING OF ATRIAL APPENDAGE;  Surgeon: Alleen Borne, MD;  Location: [facility] OR;  Service: Open Heart Surgery;;   CORONARY ARTERY BYPASS GRAFT N/A 04/13/2015   Procedure: CORONARY ARTERY BYPASS GRAFTING (CABG);  Surgeon: Alleen Borne, MD;  Location: [facility] OR;  Service: Open Heart Surgery;; LIMA-LAD, SVG-Diag, SeqSVG-rPL-rPDA    NM MYOVIEW LTD  05/2017   EF 27%.  No reversible ischemia.  Large anterior-apical and septal infarct.  Diffuse LV HK and moderate LV dilation.   TEE WITHOUT CARDIOVERSION N/A 04/13/2015   Procedure: TRANSESOPHAGEAL ECHOCARDIOGRAM (TEE);  Surgeon: Alleen Borne, MD;  Location: [facility] OR;  Service: Open Heart Surgery;  Laterality: N/A;   TRANSTHORACIC ECHOCARDIOGRAM  2/'15, 2/'16, 4/'16   a) EF 30-35%.  Mild LVH.  GRII DD.  Mild LA dilation.  Moderate diastolic dysfunction. b) Technically difficult.  Mild LVH.  EF 35%.  Diffuse HK.  Moderate LA dilation.  Mildly dilated RV with mildly reduced function.  c)   Mild concentric LVH.  EF 30-35%, diffuse HK.  Severe HK of mid apical inferior wall.  Moderate LA dilation.   TRANSTHORACIC ECHOCARDIOGRAM N/A 1/18, 5/18, 7/18   a) Moderate severely reduced LV.  EF 30 of 35% with diffuse HK.  Mild MR.  Mildly dilated RA.;; b) (In setting of TIA).  Technically difficult mild to moderate globally reduced LV function.  Mild LVH.  Mild MR.  EF estimated 40-45%.  Moderate LA dilation.;; c)  Afib. Mild to mod dilated LV with mild LVH. EF 30-35% with diffuse HK. Mildly dilated RV with mildly decrease Systolic Fxn. Mild MR    Prior to Admission medications   Medication Sig Start Date End Date Taking? Authorizing Provider  albuterol (PROAIR HFA) 108 (90 Base) MCG/ACT inhaler INHALE 1 PUFF INTO THE LUNGS EVERY 6 HOURS  AS NEEDED FOR WHEEZING OR SHORTNESS OF BREATH 10/31/17   [name], MD  albuterol (PROVENTIL) (2.5 MG/3ML) 0.083% nebulizer solution USE ONE VIAL USING NEBULIZER EVERY 6 (SIX) HOURS AS NEEDED FOR WHEEZING OR SHORTNESS OF BREATH. Patient taking differently: Take 2.5 mg by nebulization every 6 (six) hours as needed for wheezing or shortness of breath.  06/19/18   [name], MD  allopurinol (ZYLOPRIM) 100 MG tablet Take 200 mg by mouth daily.    [provider]  apixaban (ELIQUIS) 5 MG TABS tablet Take 1 tablet (5 mg total) by mouth 2 (two) times daily. 07/25/19   Melene Plan, MD  fluticasone (FLONASE) 50 MCG/ACT nasal spray Place 1 spray into both nostrils daily.    [provider]  Fluticasone-Salmeterol (ADVAIR) 250-50 MCG/DOSE AEPB Inhale 1 puff into the lungs 2 (two) times daily.    [provider]  furosemide (LASIX) 80 MG tablet  Take 1 tablet by mouth 2 (two) times daily. 08/16/19   [provider]  levothyroxine (SYNTHROID) 100 MCG tablet Take 1 tablet by mouth daily. 09/10/19   [provider]  metoprolol succinate (TOPROL-XL) 25 MG 24 hr tablet Take 12.5 mg by mouth daily. 08/21/19   [provider]  nitroGLYCERIN (NITROSTAT) 0.4 MG SL tablet Place 0.4 mg under the tongue every 5 (five) minutes as needed for chest pain.    [provider]  potassium chloride SA (K-DUR) 20 MEQ tablet Take 20 mEq by mouth daily.    [provider]  pravastatin (PRAVACHOL) 40 MG tablet TAKE ONE TABLET BY MOUTH ONCE DAILY Patient taking differently: Take 40 mg by mouth daily.  01/16/19   Marykay Lex, MD    Current Facility-Administered Medications  Medication Dose Route Frequency Provider Last Rate Last Admin   0.9 %  sodium chloride infusion   Intravenous Continuous Burnadette Pop, MD 75 mL/hr at 03/25/23 1538 New Bag at 03/25/23 1538   acetaminophen (TYLENOL) tablet 650 mg  650 mg Oral Q6H PRN Opyd, Lavone Neri, MD        Or   acetaminophen (TYLENOL) suppository 650 mg  650 mg Rectal Q6H PRN Opyd, Lavone Neri, MD       albuterol (PROVENTIL) (2.5 MG/3ML) 0.083% nebulizer solution 3 mL  3 mL Inhalation Q4H PRN Opyd, Lavone Neri, MD       levothyroxine (SYNTHROID) tablet 100 mcg  100 mcg Oral Q0600 Briscoe Deutscher, MD   100 mcg at 03/25/23 6213   mometasone-formoterol (DULERA) 200-5 MCG/ACT inhaler 2 puff  2 puff Inhalation BID Briscoe Deutscher, MD   2 puff at 03/25/23 0948   pantoprazole (PROTONIX) injection 40 mg  40 mg Intravenous Q12H Kerin Salen, MD   40 mg at 03/25/23 1229   potassium chloride SA (KLOR-CON M) CR tablet 40 mEq  40 mEq Oral Q2H Adhikari, Amrit, MD   40 mEq at 03/25/23 1652   sodium chloride flush (NS) 0.9 % injection 3 mL  3 mL Intravenous Q12H Opyd, Lavone Neri, MD   3 mL at 03/25/23 1114    Allergies as of 03/24/2023 - Review Complete 03/24/2023  Allergen Reaction Noted   Niacin and related Other (See Comments) 03/31/2015   Xarelto [rivaroxaban] Other (See Comments) 05/29/2017   Penicillins Itching, Rash, and Other (See Comments)     Family History  Problem Relation Age of Onset   Diabetes Mother    Heart disease Father    Congestive Heart Failure Sister    Stroke Brother    Heart disease Brother    Diabetes Brother     Social History   Socioeconomic History   Marital status: Divorced    Spouse name: Not on file   Number of children: Not on file   Years of education: Not on file   Highest education level: Not on file  Occupational History   Occupation: disabled  Tobacco Use   Smoking status: Former    Types: Cigarettes    Quit date: 2000    Years since quitting: 24.3   Smokeless tobacco: Never  Vaping Use   Vaping Use: Never used  Substance and Sexual Activity   Alcohol use: No   Drug use: No   Sexual activity: Yes  Other Topics Concern   Not on file  Social History Narrative   Not on file   Social Determinants of Health   Financial Resource Strain: Not on file  Food Insecurity: No Food Insecurity (03/25/2023)   Hunger Vital Sign    Worried About Running Out of Food in the Last Year: Never true    Ran Out of Food in the Last Year: Never true  Transportation Needs: No Transportation Needs (03/25/2023)   PRAPARE - Administrator, Civil Service (Medical): No    Lack of Transportation (Non-Medical): No  Physical Activity: Not on file  Stress: Not on file  Social Connections: Not on file  Intimate Partner Violence: Not At Risk (03/25/2023)   Humiliation, Afraid, Rape, and Kick questionnaire    Fear of Current or Ex-Partner: No    Emotionally Abused: No    Physically Abused: No    Sexually Abused: No    Review of Systems: As per HPI  Physical Exam: Vital signs in last 24 hours: Temp:  [97.7 F (36.5 C)-98.8 F (37.1 C)] 97.9 F (36.6 C) (04/20 1515) Pulse Rate:  [55-120] 85 (04/20 1515) Resp:  [15-20] 18 (04/20 1515) BP: (97-127)/(55-86) 111/74 (04/20 1515) SpO2:  [92 %-99 %] 99 % (04/20 1515) Weight:  [129.4 kg] 129.4 kg (04/20 1515) Last BM Date : 03/25/23  General:   Alert,  Well-developed, overweight, pleasant and cooperative in NAD Head:  Normocephalic and atraumatic. Eyes:  Sclera clear, no icterus.   Prominent pallor. Ears:  Normal auditory acuity. Nose:  No deformity, discharge,  or lesions. Mouth:  No deformity or lesions.  Oropharynx pink & moist. Neck:  Supple; no masses or thyromegaly. Lungs: On oxygen via nasal cannula, 2 L/min, clear throughout to auscultation.   No wheezes, crackles, or rhonchi. No acute distress. Heart: Irregular rate Extremities: Bilateral compression bandages in lower extremities. Neurologic:  Alert and  oriented x4;  grossly normal neurologically. Skin:  Intact without significant lesions or rashes. Psych:  Alert and cooperative. Normal mood and affect. Abdomen: Small umbilical hernia ,soft, nontender and nondistended. No masses, hepatosplenomegaly or hernias noted. Normal bowel sounds,  without guarding, and without rebound.         Lab Results: Recent Labs    03/24/23 2255 03/25/23 0600 03/25/23 1010 03/25/23 1534  WBC 12.1*  --   --   --   HGB 7.4* 8.5*  --  9.2*  HCT 25.0* 28.8* 31.5* 31.0*  PLT 379  --   --   --    BMET Recent Labs    03/24/23 2255 03/25/23 0600 03/25/23 0950 03/25/23 1534  NA 130* 123*  --  133*  K 2.2* 2.6* 2.3* 2.6*  CL 91* 83*  --  92*  CO2 28 26  --  29  GLUCOSE 134* 406*  --  113*  BUN 57* 49*  --  51*  CREATININE 1.90* 1.69*  --  1.54*  CALCIUM 8.1* 7.6*  --  8.0*   LFT Recent Labs    03/25/23 0600  PROT 5.7*  ALBUMIN 2.1*  AST 35  ALT 28  ALKPHOS 366*  BILITOT 0.7   PT/INR No results for input(s): "LABPROT", "INR" in the last 72 hours.  Studies/Results: No results found.  Impression: Bloody and black stools Anemia, hemoglobin 9.2 after 2 unit PRBC transfusion, was 7.4 on admission Chronic anticoagulation on Eliquis  Multiple comorbidities: Overweight, COPD, chronic hypoxic respiratory failure, chronic kidney disease stage III, chronic combined systolic and diastolic congestive heart failure, obstructive sleep apnea, abdominal aortic aneurysm  Hypokalemia, potassium 2.6 BUN 51, creatinine 1.54, GFR 48 Low albumin 2.1 Colonic diverticulosis noted on CAT scan of abdomen and  pelvis from 2016 Elevated alk phos 366 with T. bili 0.7, AST 35, ALT 28  Plan: Patient was initially hesitant to undergo endoscopic procedures. Discussed with patient that I cannot resume anticoagulation without identifying source of GI bleed. Recommended EGD and colonoscopy while in the hospital, which will be a high risk procedure considering patient's comorbidities. Patient understands and verbalizes consent and is agreeable to a 2-day prep. Will start him on clear liquid diet and colonic prep today. Reevaluate tomorrow in a.m., will likely need prep again tomorrow. Plan EGD and colonoscopy on Monday with me. Will start patient on  pantoprazole 40 mg twice daily, monitor H&H and transfuse if hemoglobin is less than 7.   LOS: 0 days   Kerin Salen, MD  03/25/2023, 4:54 PM

## 2023-03-25 NOTE — Progress Notes (Signed)
Pt refused cpap he said he never worn one at home. Only use oxygen.

## 2023-03-25 NOTE — Progress Notes (Addendum)
Brief same day note:  Patient is a 72 year old male from [place] [facility] with history of hypertension, COPD, chronic hypoxic respiratory failure on 2 L of oxygen , CKD stage IIIb, atrial fibrillation on Eliquis, combined systolic/diastolic CHF, OSA, AAA who presents to the emergency department with complaint of dark blood in the stool multiple times.  He noticed dark red blood in diaper multiple times over the past week.  Takes Eliquis.  On presentation, lab work showed potassium of 2.2, hemoglobin 7.4, creatinine of 1.9 . He denies any nausea, abdomen pain or vomiting.  Blood pressure was soft on presentation.  Patient was admitted for the management of lower GI bleed, GI consulted.  Patient seen and examined the bedside this morning.  During my evaluation, he was lying on bed, appears very weak and deconditioned.  Heart rate ranging from 90-120.  Denies any abdominal pain or chest pain.   Assessment and plan:  Acute lower GI bleed: Presented with hematochezia.  Hemoglobin of 7.4 on presentation.  Ordered 2 units of blood transfusion.  GI consulted.  Hemoglobin stable in the range of 8.5 this morning. Will continue monitor H&H.  Patient was reluctant for colonoscopy but now has agreed.  AKI in CKD stage IIIa: Baseline creatinine was 1.5 ,3 years ago  Presented with creatinine of 1.9.  Most likely prerenal in the setting of blood loss.  antihypertensives on hold.  Kidney function improving  Combined systolic/diastolic CHF: Currently euvolemic.  Diuretics on hold due to low blood pressure, AKI.  Echo done on 10/2018 showed EF of 30 to 35%, diffuse hypokinesis. Has chronic edema bilateral lower extremities, wrapped with Ace  Persistent A-fib: On Eliquis at home and metoprolol for rate control.  Blood pressure soft so metoprolol on hold, might resume at low-dose.  Eliquis on hold  Hypokalemia: Being aggressively supplemented and monitored  OSA: On CPAP  COPD: Not in exacerbation.  Uses 2L of at  home.  Elevated alkaline phosphatase: Elevated GGT. liver enzymes normal  Hyponatremia: Sodium level dropped from 130 to123, continue gentle IV fluids for today  Leukocytosis: Mild.  Most likely reactive, continue to monitor  Debility/deconditioning/obesity: Morbidly obese patient.  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  Ambulates with the help of wheelchair   I called and discussed our management plan with the son on phone on 4/20

## 2023-03-25 NOTE — ED Notes (Signed)
When pt arrived with EMS he was incontinent of urine and stool. Pt BM had blood in it.

## 2023-03-26 ENCOUNTER — Inpatient Hospital Stay (HOSPITAL_COMMUNITY): Payer: No Typology Code available for payment source

## 2023-03-26 DIAGNOSIS — K922 Gastrointestinal hemorrhage, unspecified: Secondary | ICD-10-CM | POA: Diagnosis not present

## 2023-03-26 LAB — URINALYSIS, ROUTINE W REFLEX MICROSCOPIC
Bilirubin Urine: NEGATIVE
Glucose, UA: NEGATIVE mg/dL
Ketones, ur: NEGATIVE mg/dL
Nitrite: NEGATIVE
Protein, ur: NEGATIVE mg/dL
Specific Gravity, Urine: 1.012 (ref 1.005–1.030)
WBC, UA: >50 WBC/hpf (ref 0–5)
pH: 5 (ref 5.0–8.0)

## 2023-03-26 LAB — COMPREHENSIVE METABOLIC PANEL
ALT: 26 U/L (ref 0–44)
AST: 29 U/L (ref 15–41)
Albumin: 1.7 g/dL — ABNORMAL LOW (ref 3.5–5.0)
Alkaline Phosphatase: 334 U/L — ABNORMAL HIGH (ref 38–126)
Anion gap: 9 (ref 5–15)
BUN: 42 mg/dL — ABNORMAL HIGH (ref 8–23)
CO2: 26 mmol/L (ref 22–32)
Calcium: 7.9 mg/dL — ABNORMAL LOW (ref 8.9–10.3)
Chloride: 100 mmol/L (ref 98–111)
Creatinine, Ser: 1.37 mg/dL — ABNORMAL HIGH (ref 0.61–1.24)
GFR, Estimated: 55 mL/min — ABNORMAL LOW (ref >60)
Glucose, Bld: 121 mg/dL — ABNORMAL HIGH (ref 70–99)
Potassium: 3.1 mmol/L — ABNORMAL LOW (ref 3.5–5.1)
Sodium: 135 mmol/L (ref 135–145)
Total Bilirubin: 0.7 mg/dL (ref 0.3–1.2)
Total Protein: 5.3 g/dL — ABNORMAL LOW (ref 6.5–8.1)

## 2023-03-26 LAB — CBC
HCT: 29.1 % — ABNORMAL LOW (ref 39.0–52.0)
Hemoglobin: 8.5 g/dL — ABNORMAL LOW (ref 13.0–17.0)
MCH: 25 pg — ABNORMAL LOW (ref 26.0–34.0)
MCHC: 29.2 g/dL — ABNORMAL LOW (ref 30.0–36.0)
MCV: 85.6 fL (ref 80.0–100.0)
Platelets: 388 10*3/uL (ref 150–400)
RBC: 3.4 MIL/uL — ABNORMAL LOW (ref 4.22–5.81)
RDW: 16.3 % — ABNORMAL HIGH (ref 11.5–15.5)
WBC: 11.8 10*3/uL — ABNORMAL HIGH (ref 4.0–10.5)
nRBC: 0 % (ref 0.0–0.2)

## 2023-03-26 LAB — HEMOGLOBIN AND HEMATOCRIT, BLOOD
HCT: 29.9 % — ABNORMAL LOW (ref 39.0–52.0)
Hemoglobin: 8.7 g/dL — ABNORMAL LOW (ref 13.0–17.0)

## 2023-03-26 MED ORDER — PEG 3350-KCL-NA BICARB-NACL 420 G PO SOLR
4000.0000 mL | Freq: Once | ORAL | Status: AC
  Administered 2023-03-26: 4000 mL via ORAL

## 2023-03-26 MED ORDER — SODIUM CHLORIDE 0.9 % IV SOLN
2.0000 g | INTRAVENOUS | Status: DC
  Administered 2023-03-26: 2 g via INTRAVENOUS
  Filled 2023-03-26: qty 20

## 2023-03-26 MED ORDER — FUROSEMIDE 10 MG/ML IJ SOLN
40.0000 mg | Freq: Once | INTRAMUSCULAR | Status: AC
  Administered 2023-03-26: 40 mg via INTRAVENOUS
  Filled 2023-03-26: qty 4

## 2023-03-26 MED ORDER — PEG-KCL-NACL-NASULF-NA ASC-C 100 G PO SOLR
1.0000 | Freq: Once | ORAL | Status: AC
  Administered 2023-03-26: 200 g via ORAL
  Filled 2023-03-26: qty 1

## 2023-03-26 MED ORDER — METOPROLOL TARTRATE 12.5 MG HALF TABLET
12.5000 mg | ORAL_TABLET | Freq: Two times a day (BID) | ORAL | Status: DC
  Administered 2023-03-26 – 2023-03-30 (×10): 12.5 mg via ORAL
  Filled 2023-03-26 (×10): qty 1

## 2023-03-26 MED ORDER — ONDANSETRON HCL 4 MG/2ML IJ SOLN
4.0000 mg | Freq: Four times a day (QID) | INTRAMUSCULAR | Status: DC | PRN
  Administered 2023-03-26 – 2023-04-08 (×11): 4 mg via INTRAVENOUS
  Filled 2023-03-26 (×12): qty 2

## 2023-03-26 MED ORDER — POTASSIUM CHLORIDE CRYS ER 20 MEQ PO TBCR
40.0000 meq | EXTENDED_RELEASE_TABLET | ORAL | Status: AC
  Administered 2023-03-26 (×2): 40 meq via ORAL
  Filled 2023-03-26 (×2): qty 2

## 2023-03-26 MED ORDER — MELATONIN 3 MG PO TABS
3.0000 mg | ORAL_TABLET | Freq: Once | ORAL | Status: AC
  Administered 2023-03-26: 3 mg via ORAL
  Filled 2023-03-26: qty 1

## 2023-03-26 MED ORDER — SODIUM CHLORIDE 0.9 % IV SOLN
500.0000 mg | INTRAVENOUS | Status: DC
  Administered 2023-03-26: 500 mg via INTRAVENOUS
  Filled 2023-03-26: qty 5

## 2023-03-26 MED ORDER — METOPROLOL TARTRATE 5 MG/5ML IV SOLN
2.5000 mg | Freq: Once | INTRAVENOUS | Status: AC
  Administered 2023-03-26: 2.5 mg via INTRAVENOUS
  Filled 2023-03-26: qty 5

## 2023-03-26 NOTE — Progress Notes (Signed)
   03/26/23 0801  Assess: MEWS Score  Temp 99.7 F (37.6 C)  BP (!) 113/57  MAP (mmHg) 75  Pulse Rate (!) 119  Resp 14  SpO2 99 %  O2 Device Nasal Cannula  O2 Flow Rate (L/min) 3 L/min  Assess: MEWS Score  MEWS Temp 0  MEWS Systolic 0  MEWS Pulse 2  MEWS RR 0  MEWS LOC 0  MEWS Score 2  MEWS Score Color Yellow  Assess: if the MEWS score is Yellow or Red  Were vital signs taken at a resting state? Yes  Focused Assessment Change from prior assessment (see assessment flowsheet)  Does the patient meet 2 or more of the SIRS criteria? No  Does the patient have a confirmed or suspected source of infection? No  Provider and Rapid Response Notified? No  MEWS guidelines implemented  Yes, yellow  Treat  MEWS Interventions Considered administering scheduled or prn medications/treatments as ordered  Take Vital Signs  Increase Vital Sign Frequency  Yellow: Q2hr x1, continue Q4hrs until patient remains green for 12hrs  Escalate  MEWS: Escalate Yellow: Discuss with charge nurse and consider notifying provider and/or RRT  Notify: Charge Nurse/RN  Name of Charge Nurse/RN Notified Launa Flight, RN  Assess: SIRS CRITERIA  SIRS Temperature  0  SIRS Pulse 1  SIRS Respirations  0  SIRS WBC 0  SIRS Score Sum  1

## 2023-03-26 NOTE — Progress Notes (Addendum)
Subjective: Patient has finished about 90% of colonic prep.  Stool in rectal tube appears to take dark cream.  As per nurse it was initially dark and black.  He denies any abdominal pain.  Objective: Vital signs in last 24 hours: Temp:  [97.7 F (36.5 C)-100.9 F (38.3 C)] 100.9 F (38.3 C) (04/21 1025) Pulse Rate:  [77-142] 121 (04/21 1025) Resp:  [14-19] 16 (04/21 1024) BP: (100-127)/(56-86) 104/68 (04/21 1024) SpO2:  [92 %-99 %] 99 % (04/21 1025) Weight:  [129.4 kg-131.2 kg] 131.2 kg (04/21 0500) Weight change:  Last BM Date : 03/26/23  PE: Overweight, on oxygen via nasal cannula at 2 L/min GENERAL: Prominent pallor  ABDOMEN: Nondistended, soft, nontender EXTREMITIES: No edema  Lab Results: Results for orders placed or performed during the hospital encounter of 03/24/23 (from the past 48 hour(s))  Comprehensive metabolic panel     Status: Abnormal   Collection Time: 03/24/23 10:55 PM  Result Value Ref Range   Sodium 130 (L) 135 - 145 mmol/L   Potassium 2.2 (LL) 3.5 - 5.1 mmol/L    Comment: CRITICAL RESULT CALLED TO, READ BACK BY AND VERIFIED WITH JASPER,A AT 0227 ON 03/25/23 BY LUZOLOP    Chloride 91 (L) 98 - 111 mmol/L   CO2 28 22 - 32 mmol/L   Glucose, Bld 134 (H) 70 - 99 mg/dL    Comment: Glucose reference range applies only to samples taken after fasting for at least 8 hours.   BUN 57 (H) 8 - 23 mg/dL   Creatinine, Ser 1.61 (H) 0.61 - 1.24 mg/dL   Calcium 8.1 (L) 8.9 - 10.3 mg/dL   Total Protein 6.0 (L) 6.5 - 8.1 g/dL   Albumin 2.1 (L) 3.5 - 5.0 g/dL   AST 56 (H) 15 - 41 U/L   ALT 32 0 - 44 U/L   Alkaline Phosphatase 409 (H) 38 - 126 U/L   Total Bilirubin 0.6 0.3 - 1.2 mg/dL   GFR, Estimated 37 (L) >60 mL/min    Comment: (NOTE) Calculated using the CKD-EPI Creatinine Equation (2021)    Anion gap 11 5 - 15    Comment: Performed at [facility], [address], [place], [place] [address]  CBC     Status: Abnormal   Collection Time: 03/24/23  10:55 PM  Result Value Ref Range   WBC 12.1 (H) 4.0 - 10.5 K/uL   RBC 2.97 (L) 4.22 - 5.81 MIL/uL   Hemoglobin 7.4 (L) 13.0 - 17.0 g/dL   HCT 54.0 (L) 98.1 - 19.1 %   MCV 84.2 80.0 - 100.0 fL   MCH 24.9 (L) 26.0 - 34.0 pg   MCHC 29.6 (L) 30.0 - 36.0 g/dL   RDW 47.8 (H) 29.5 - 62.1 %   Platelets 379 150 - 400 K/uL   nRBC 0.0 0.0 - 0.2 %    Comment: Performed at [facility], [address], [place], [place] [address]  Type and screen Ordered by PROVIDER DEFAULT     Status: None (Preliminary result)   Collection Time: 03/24/23 11:54 PM  Result Value Ref Range   ABO/RH(D) A NEG    Antibody Screen NEG    Sample Expiration 03/27/2023,2359    Unit Number [id number]    Blood Component Type RED CELLS,LR    Unit division 00    Status of Unit ISSUED    Transfusion Status OK TO TRANSFUSE    Crossmatch Result      Compatible Performed at [facility], [address] [place], [place] [address]    Unit Number [id number]    Blood Component Type RED CELLS,LR    Unit division 00    Status of Unit ISSUED    Transfusion Status OK TO TRANSFUSE    Crossmatch Result Compatible   Prepare RBC (crossmatch)     Status: None   Collection Time: 03/25/23 12:10 AM  Result Value Ref Range   Order Confirmation      ORDER PROCESSED BY BLOOD BANK Performed at [facility], [address], [place], [place] [address]   Gamma GT     Status: Abnormal   Collection Time: 03/25/23  6:00 AM  Result Value Ref Range   GGT 611 (H) 7 - 50 U/L    Comment: Performed at [facility], [address] [place], [place] [address]  Comprehensive metabolic panel     Status: Abnormal   Collection Time: 03/25/23  6:00 AM  Result Value Ref Range   Sodium 123 (L) 135 - 145 mmol/L    Comment: DELTA CHECK NOTED   Potassium 2.6 (LL) 3.5 - 5.1 mmol/L    Comment: CRITICAL RESULT CALLED TO, READ BACK BY AND VERIFIED WITH SAVOIE, B RN AT [phone] 03/25/23 BY  TIBBITTS,K    Chloride 83 (L) 98 - 111 mmol/L   CO2 26 22 - 32 mmol/L   Glucose, Bld 406 (H) 70 - 99 mg/dL    Comment: Glucose reference range applies only to samples taken after fasting for at least 8 hours.   BUN 49 (H) 8 - 23 mg/dL   Creatinine, Ser 0.27 (H) 0.61 - 1.24 mg/dL   Calcium 7.6 (L) 8.9 - 10.3 mg/dL   Total Protein 5.7 (L) 6.5 - 8.1 g/dL   Albumin 2.1 (L) 3.5 - 5.0 g/dL   AST 35 15 - 41 U/L   ALT 28 0 - 44 U/L   Alkaline Phosphatase 366 (H) 38 - 126 U/L   Total Bilirubin 0.7 0.3 - 1.2 mg/dL   GFR, Estimated 43 (L) >60 mL/min    Comment: (NOTE) Calculated using the CKD-EPI Creatinine Equation (2021)    Anion gap 14 5 - 15    Comment: Performed at [facility], [address], [place], [place] [address]  Magnesium     Status: Abnormal   Collection Time: 03/25/23  6:00 AM  Result Value Ref Range   Magnesium 9.7 (HH) 1.7 - 2.4 mg/dL    Comment: RESULTS CONFIRMED BY MANUAL DILUTION CRITICAL RESULT CALLED TO, READ BACK BY AND VERIFIED WITH: Derl Barrow RN AT 4403 03/25/23 BY TIBBITTS, K Performed at [facility], [address] [place], [place] [address]   Hemoglobin     Status: Abnormal   Collection Time: 03/25/23  6:00 AM  Result Value Ref Range   Hemoglobin 8.5 (L) 13.0 - 17.0 g/dL    Comment: Performed at [facility], [address], [place], [place] [address]  Hematocrit     Status: Abnormal   Collection Time: 03/25/23  6:00 AM  Result Value Ref Range   HCT 28.8 (L) 39.0 - 52.0 %    Comment: Performed at [facility], [address] [place], [place] [address]  Magnesium     Status: None   Collection Time: 03/25/23  8:59 AM  Result Value Ref Range   Magnesium 2.4 1.7 - 2.4 mg/dL    Comment: Performed at [facility], [address] [place], [place] [address]  Potassium     Status: Abnormal   Collection Time: 03/25/23  9:50 AM  Result Value Ref Range   Potassium  2.3 (LL) 3.5 - 5.1 mmol/L    Comment: CRITICAL RESULT CALLED TO, READ BACK BY AND VERIFIED WITH SINCLAIR, L RN AT 1128 03/25/23 BY TIBBITTS, K Performed at [facility], [address], [place], [place] [address]   Hematocrit     Status: Abnormal   Collection Time: 03/25/23 10:10 AM  Result Value Ref Range   HCT 31.5 (L) 39.0 - 52.0 %    Comment: Performed at [facility], [address] [place], [place] [address]  Sodium, urine, random     Status: None   Collection Time: 03/25/23 11:43 AM  Result Value Ref Range   Sodium, Ur 62 mmol/L    Comment: Performed at [facility], [address], [place], [place] [address]  MRSA Next Gen by PCR, Nasal     Status: None   Collection Time: 03/25/23  3:31 PM   Specimen: Nasal Mucosa; Nasal Swab  Result Value Ref Range   MRSA by PCR Next Gen NOT DETECTED NOT DETECTED    Comment: (NOTE) The GeneXpert MRSA Assay (FDA approved for NASAL specimens only), is one component of a comprehensive MRSA colonization surveillance program. It is not intended to diagnose MRSA infection nor to guide or monitor treatment for MRSA infections. Test performance is not FDA approved in patients less than 71 years old. Performed at [facility], [address], [place], [place] [address]   Basic metabolic panel     Status: Abnormal   Collection Time: 03/25/23  3:34 PM  Result Value Ref Range   Sodium 133 (L) 135 - 145 mmol/L   Potassium 2.6 (LL) 3.5 - 5.1 mmol/L    Comment: CRITICAL RESULT CALLED TO, READ BACK BY AND VERIFIED WITH DICKEY, E RN AT 1635 03/25/23 BY TIBBITTS, K    Chloride 92 (L) 98 - 111 mmol/L   CO2 29 22 - 32 mmol/L   Glucose, Bld 113 (H) 70 - 99 mg/dL    Comment: Glucose reference range applies only to samples taken after fasting for at least 8 hours.   BUN 51 (H) 8 - 23 mg/dL   Creatinine, Ser 1.30 (H) 0.61 - 1.24 mg/dL   Calcium 8.0 (L) 8.9 - 10.3 mg/dL   GFR,  Estimated 48 (L) >60 mL/min    Comment: (NOTE) Calculated using the CKD-EPI Creatinine Equation (2021)    Anion gap 12 5 - 15    Comment: Performed at [facility], [address], [place], [place] [address]  Hemoglobin and hematocrit, blood     Status: Abnormal   Collection Time: 03/25/23  3:34 PM  Result Value Ref Range   Hemoglobin 9.2 (L) 13.0 - 17.0 g/dL   HCT 46.9 (L) 62.9 - 52.8 %    Comment: Performed at [facility], [address] [place], [place] [address]  Comprehensive metabolic panel     Status: Abnormal   Collection Time: 03/26/23  7:31 AM  Result Value Ref Range   Sodium 135 135 - 145 mmol/L   Potassium 3.1 (L) 3.5 - 5.1 mmol/L   Chloride 100 98 - 111 mmol/L   CO2 26 22 - 32 mmol/L   Glucose, Bld 121 (H) 70 - 99 mg/dL    Comment: Glucose reference range applies only  to samples taken after fasting for at least 8 hours.   BUN 42 (H) 8 - 23 mg/dL   Creatinine, Ser 9.60 (H) 0.61 - 1.24 mg/dL   Calcium 7.9 (L) 8.9 - 10.3 mg/dL   Total Protein 5.3 (L) 6.5 - 8.1 g/dL   Albumin 1.7 (L) 3.5 - 5.0 g/dL   AST 29 15 - 41 U/L   ALT 26 0 - 44 U/L   Alkaline Phosphatase 334 (H) 38 - 126 U/L   Total Bilirubin 0.7 0.3 - 1.2 mg/dL   GFR, Estimated 55 (L) >60 mL/min    Comment: (NOTE) Calculated using the CKD-EPI Creatinine Equation (2021)    Anion gap 9 5 - 15    Comment: Performed at [facility], [address], [place], [place] [address]  CBC     Status: Abnormal   Collection Time: 03/26/23  7:31 AM  Result Value Ref Range   WBC 11.8 (H) 4.0 - 10.5 K/uL   RBC 3.40 (L) 4.22 - 5.81 MIL/uL   Hemoglobin 8.5 (L) 13.0 - 17.0 g/dL   HCT 81.1 (L) 91.4 - 78.2 %   MCV 85.6 80.0 - 100.0 fL   MCH 25.0 (L) 26.0 - 34.0 pg   MCHC 29.2 (L) 30.0 - 36.0 g/dL   RDW 95.6 (H) 21.3 - 08.6 %   Platelets 388 150 - 400 K/uL   nRBC 0.0 0.0 - 0.2 %    Comment: Performed at [facility], [address], [place], [place] [address]     Studies/Results: No results found.  Medications: I have reviewed the patient's current medications.  Assessment: Black/bloody stools Hemoglobin 8.5 today Renal function has improved to BUN/creatinine/GFR of 42/1.37/55  Elevated ALP of 334 Improvement in hypokalemia, 3.1 today, was 2.6  Plan: Encouraged patient that he did well with colonic prep yesterday, will give him 1 dose of MoviPrep today.  Discussed about importance of 2-day prep, which allows better mucosal visualization. Patient scheduled for an EGD and colonoscopy tomorrow, the risks and the benefits of the procedure were discussed with the patient in details.  He understands and verbalizes consent.  Will order an ultrasound as patient continues to have elevated ALP(?  Source: Liver/bone/intestine) and may need ALP fractionation.   Kerin Salen, MD 03/26/2023, 11:07 AM

## 2023-03-26 NOTE — Progress Notes (Signed)
PROGRESS NOTE  AUDIEL SCHEIBER  ZOX:[id number] DOB: 1951/07/13 DOA: 03/24/2023 PCP: Virl Cagey, MD   Brief Narrative: Patient is a 72 year old male from [facility] with history of hypertension, COPD, chronic hypoxic respiratory failure on 2 L of oxygen , CKD stage IIIb, atrial fibrillation on Eliquis, combined systolic/diastolic CHF, OSA, AAA who presents to the emergency department with complaint of dark blood in the stool multiple times.  He noticed dark red blood in diaper multiple times over the past week.  Takes Eliquis.  On presentation, lab work showed potassium of 2.2, hemoglobin 7.4, creatinine of 1.9 . He denies any nausea, abdomen pain or vomiting.  Blood pressure was soft on presentation.  Patient was admitted for the management of  GI bleed, GI consulted. Patient seen and examined the bedside this morning.  Plan for colonoscopy tomorrow  Assessment & Plan:  Acute lower GI bleed: Presented with hematochezia, dark stool.  Hemoglobin of 7.4 on presentation.  Ordered 2 units of blood transfusion.  GI consulted.  Hemoglobin stable in the range of 8.5 this morning. Will continue monitor H&H.  Patient was reluctant for colonoscopy but now has agreed.  Plan for tomorrow   AKI in CKD stage IIIa: Baseline creatinine was 1.5 ,3 years ago  Presented with creatinine of 1.9.  Most likely prerenal in the setting of blood loss.  antihypertensives on hold.  Kidney function improved with IV fluid, IV fluid stopped today   Combined systolic/diastolic CHF: Currently euvolemic.  Diuretics was held due to low blood pressure, AKI.  Echo done on 10/2018 showed EF of 30 to 35%, diffuse hypokinesis. Has chronic edema bilateral lower extremities.   Persistent A-fib: On Eliquis at home and metoprolol for rate control.  Blood pressure soft so metoprolol was held, now restarted on low-dose.  Eliquis on hold. Heart rate in the range of 100-120, expect improvement after starting metoprolol  Hypokalemia:  Being aggressively supplemented and monitored   OSA: On CPAP   COPD: Not in exacerbation.  Uses 2L of at home.   Elevated alkaline phosphatase: Elevated GGT. liver enzymes normal.  Right upper quadrant ultrasound pending   Hyponatremia: Normalized as  Leukocytosis/mild fever: Has mild leukocytosis.  Will check UA, chest x-ray.  Blood culture sent.  Will hold on starting on antibiotics   Debility/deconditioning/obesity: Morbidly obese patient.  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  Ambulates with the help of wheelchair.  Lives at a SNF, TOC consulted     Pressure Injury 03/25/23 Buttocks Right Stage 2 -  Partial thickness loss of dermis presenting as a shallow open injury with a red, pink wound bed without slough. (Active)  03/25/23 1522  Location: Buttocks  Location Orientation: Right  Staging: Stage 2 -  Partial thickness loss of dermis presenting as a shallow open injury with a red, pink wound bed without slough.  Wound Description (Comments):   Present on Admission: Yes  Dressing Type Foam - Lift dressing to assess site every shift 03/26/23 0802     Pressure Injury 03/25/23 Buttocks Right Stage 2 -  Partial thickness loss of dermis presenting as a shallow open injury with a red, pink wound bed without slough. (Active)  03/25/23 1523  Location: Buttocks  Location Orientation: Right  Staging: Stage 2 -  Partial thickness loss of dermis presenting as a shallow open injury with a red, pink wound bed without slough.  Wound Description (Comments):   Present on Admission: Yes  Dressing Type Foam - Lift  dressing to assess site every shift 03/26/23 0802    DVT prophylaxis:SCDs Start: 03/25/23 0408     Code Status: Full Code  Family Communication:Called and discussed with son on phone on 4/20  Patient status: Inpatient  Patient is from : SNF  Anticipated discharge to: SNF if  Estimated DC date: 2 to 3 days   Consultants: GI  Procedures:  None  Antimicrobials:  Anti-infectives (From admission, onward)    None       Subjective: Patient is in and examined the bedside today.  He was in A-fib with heart rate in the range of 100-120.  Lies in bed, appears very weak, deconditioned.  Rectal tube was placed yesterday and is draining black stool.  Intense Foley smell in the room.  Denies any nausea, vomiting or abdominal pain.  Not happy because he is only on clear liquid diet.  Objective: Vitals:   03/26/23 0801 03/26/23 0802 03/26/23 1024 03/26/23 1025  BP: (!) 113/57  104/68   Pulse: (!) 119  (!) 142 (!) 121  Resp: 14  16   Temp: 99.7 F (37.6 C)   (!) 100.9 F (38.3 C)  TempSrc: Oral   Oral  SpO2: 99% 97% 98% 99%  Weight:      Height:        Intake/Output Summary (Last 24 hours) at 03/26/2023 1149 Last data filed at 03/26/2023 0646 Gross per 24 hour  Intake 2534.52 ml  Output 1450 ml  Net 1084.52 ml   Filed Weights   03/25/23 1515 03/26/23 0500  Weight: 129.4 kg 131.2 kg    Examination:   General exam: Overall comfortable, not in distress, morbidly obese, very deconditioned, weak HEENT: PERRL Respiratory system:  no wheezes or crackles, or sounds bilaterally Cardiovascular system: S1 & S2 heard, RRR.  Gastrointestinal system: Abdomen is nondistended, soft and nontender. Central nervous system: Alert and oriented Extremities: 1+ bilateral lower extremity pitting edema, no clubbing ,no cyanosis Skin: No rashes, no ulcers,no icterus   GU: Rectal tube with dark liquid stool   Data Reviewed: I have personally reviewed following labs and imaging studies  CBC: Recent Labs  Lab 03/24/23 2255 03/25/23 0600 03/25/23 1010 03/25/23 1534 03/26/23 0731  WBC 12.1*  --   --   --  11.8*  HGB 7.4* 8.5*  --  9.2* 8.5*  HCT 25.0* 28.8* 31.5* 31.0* 29.1*  MCV 84.2  --   --   --  85.6  PLT 379  --   --   --  388   Basic Metabolic Panel: Recent Labs  Lab 03/24/23 2255 03/25/23 0600 03/25/23 0859  03/25/23 0950 03/25/23 1534 03/26/23 0731  NA 130* 123*  --   --  133* 135  K 2.2* 2.6*  --  2.3* 2.6* 3.1*  CL 91* 83*  --   --  92* 100  CO2 28 26  --   --  29 26  GLUCOSE 134* 406*  --   --  113* 121*  BUN 57* 49*  --   --  51* 42*  CREATININE 1.90* 1.69*  --   --  1.54* 1.37*  CALCIUM 8.1* 7.6*  --   --  8.0* 7.9*  MG  --  9.7* 2.4  --   --   --      Recent Results (from the past 240 hour(s))  MRSA Next Gen by PCR, Nasal     Status: None   Collection Time: 03/25/23  3:31 PM  Specimen: Nasal Mucosa; Nasal Swab  Result Value Ref Range Status   MRSA by PCR Next Gen NOT DETECTED NOT DETECTED Final    Comment: (NOTE) The GeneXpert MRSA Assay (FDA approved for NASAL specimens only), is one component of a comprehensive MRSA colonization surveillance program. It is not intended to diagnose MRSA infection nor to guide or monitor treatment for MRSA infections. Test performance is not FDA approved in patients less than 66 years old. Performed at [facility], [address], [place], [place] [address]      Radiology Studies: No results found.  Scheduled Meds:  levothyroxine  100 mcg Oral Q0600   metoprolol tartrate  12.5 mg Oral BID   mometasone-formoterol  2 puff Inhalation BID   pantoprazole (PROTONIX) IV  40 mg Intravenous Q12H   peg 3350 powder  1 kit Oral Once   potassium chloride  40 mEq Oral Q4H   sodium chloride flush  3 mL Intravenous Q12H   Continuous Infusions:   LOS: 1 day   Burnadette Pop, MD [facility] P4/21/2024, 11:49 AM

## 2023-03-26 NOTE — Progress Notes (Signed)
    Patient Name: Charles Mckinney           DOB: 06/17/1951  MRN: [id number]      Admission Date: 03/24/2023  Attending Provider: Burnadette Pop, MD  Primary Diagnosis: GI bleeding   Level of care: Progressive    CROSS COVER NOTE   Date of Service   03/26/2023   Charles Mckinney, 72 y.o. male, was admitted on 03/24/2023 for GI bleeding.    HPI/Events of Note   Reports patient HR 120-140s.  Asymptomatic.  Hemodynamically stable.  EKG confirms A-fib RVR.  BP 113/70.  RN may give 2.5 mg metoprolol to help control rate. RN to report back if HR sustaining > 120 bpm.   Interventions/ Plan   2.5 mg IV metoprolol x 1        Anthoney Harada, DNP, [employer] [facility] [employer]

## 2023-03-26 NOTE — Progress Notes (Signed)
Pt refused cpap again tonight.

## 2023-03-26 NOTE — Progress Notes (Signed)
Pt reeducated on need to turn to prevent pressure injuries. Pt refused. Pt refused a different mattress.

## 2023-03-26 NOTE — Progress Notes (Addendum)
Pt is refusing to turn. He is refusing to put pillows under hips to alternate pressure. He is refusing the air mattress. Pt educated on the need to turn to heal his current pressure injuries and to prevent future ones.

## 2023-03-27 ENCOUNTER — Inpatient Hospital Stay (HOSPITAL_COMMUNITY): Payer: No Typology Code available for payment source

## 2023-03-27 DIAGNOSIS — E876 Hypokalemia: Secondary | ICD-10-CM | POA: Diagnosis not present

## 2023-03-27 DIAGNOSIS — I4821 Permanent atrial fibrillation: Secondary | ICD-10-CM | POA: Diagnosis not present

## 2023-03-27 DIAGNOSIS — K922 Gastrointestinal hemorrhage, unspecified: Secondary | ICD-10-CM | POA: Diagnosis not present

## 2023-03-27 DIAGNOSIS — I5042 Chronic combined systolic (congestive) and diastolic (congestive) heart failure: Secondary | ICD-10-CM | POA: Diagnosis not present

## 2023-03-27 DIAGNOSIS — D649 Anemia, unspecified: Secondary | ICD-10-CM | POA: Diagnosis not present

## 2023-03-27 LAB — BLOOD CULTURE ID PANEL (REFLEXED) - BCID2

## 2023-03-27 LAB — COMPREHENSIVE METABOLIC PANEL
ALT: 23 U/L (ref 0–44)
AST: 23 U/L (ref 15–41)
Albumin: 2 g/dL — ABNORMAL LOW (ref 3.5–5.0)
Alkaline Phosphatase: 289 U/L — ABNORMAL HIGH (ref 38–126)
Anion gap: 11 (ref 5–15)
BUN: 35 mg/dL — ABNORMAL HIGH (ref 8–23)
CO2: 26 mmol/L (ref 22–32)
Calcium: 8 mg/dL — ABNORMAL LOW (ref 8.9–10.3)
Chloride: 100 mmol/L (ref 98–111)
Creatinine, Ser: 1.11 mg/dL (ref 0.61–1.24)
GFR, Estimated: >60 mL/min (ref >60)
Glucose, Bld: 128 mg/dL — ABNORMAL HIGH (ref 70–99)
Potassium: 2.6 mmol/L — CL (ref 3.5–5.1)
Sodium: 137 mmol/L (ref 135–145)
Total Bilirubin: 0.6 mg/dL (ref 0.3–1.2)
Total Protein: 5.8 g/dL — ABNORMAL LOW (ref 6.5–8.1)

## 2023-03-27 LAB — BPAM RBC
Blood Product Expiration Date: 202405162359
Blood Product Expiration Date: 202405162359
ISSUE DATE / TIME: 202404200133
ISSUE DATE / TIME: 202404200415
Unit Type and Rh: 600

## 2023-03-27 LAB — TYPE AND SCREEN
ABO/RH(D): A NEG
Antibody Screen: NEGATIVE
Unit division: 0
Unit division: 0

## 2023-03-27 LAB — HEMOGLOBIN AND HEMATOCRIT, BLOOD
HCT: 30.1 % — ABNORMAL LOW (ref 39.0–52.0)
Hemoglobin: 8.9 g/dL — ABNORMAL LOW (ref 13.0–17.0)

## 2023-03-27 LAB — MAGNESIUM: Magnesium: 2.3 mg/dL (ref 1.7–2.4)

## 2023-03-27 LAB — CULTURE, BLOOD (ROUTINE X 2): Special Requests: ADEQUATE

## 2023-03-27 LAB — POTASSIUM: Potassium: 2.8 mmol/L — ABNORMAL LOW (ref 3.5–5.1)

## 2023-03-27 MED ORDER — PEG 3350-KCL-NA BICARB-NACL 420 G PO SOLR
4000.0000 mL | Freq: Once | ORAL | Status: AC
  Administered 2023-03-27: 4000 mL via ORAL

## 2023-03-27 MED ORDER — HYDROMORPHONE HCL 1 MG/ML IJ SOLN
0.2500 mg | Freq: Once | INTRAMUSCULAR | Status: AC | PRN
  Administered 2023-03-27: 0.25 mg via INTRAVENOUS
  Filled 2023-03-27: qty 1

## 2023-03-27 MED ORDER — ORAL CARE MOUTH RINSE: 15.0000 mL | OROMUCOSAL | Status: DC | PRN

## 2023-03-27 MED ORDER — POTASSIUM CHLORIDE 10 MEQ/100ML IV SOLN
10.0000 meq | INTRAVENOUS | Status: AC
  Administered 2023-03-27 (×6): 10 meq via INTRAVENOUS
  Filled 2023-03-27 (×2): qty 100

## 2023-03-27 MED ORDER — AMIODARONE HCL IN DEXTROSE 360-4.14 MG/200ML-% IV SOLN
30.0000 mg/h | INTRAVENOUS | Status: DC
  Administered 2023-03-27 – 2023-03-31 (×7): 30 mg/h via INTRAVENOUS
  Filled 2023-03-27 (×7): qty 200

## 2023-03-27 MED ORDER — POTASSIUM CHLORIDE 20 MEQ PO PACK
40.0000 meq | PACK | ORAL | Status: DC
  Administered 2023-03-27: 40 meq via ORAL
  Filled 2023-03-27: qty 2

## 2023-03-27 MED ORDER — POTASSIUM CHLORIDE 20 MEQ PO PACK
40.0000 meq | PACK | ORAL | Status: DC
  Administered 2023-03-27: 40 meq via ORAL

## 2023-03-27 MED ORDER — SODIUM CHLORIDE 0.9 % IV SOLN
2.0000 g | INTRAVENOUS | Status: AC
  Administered 2023-03-27 – 2023-04-02 (×7): 2 g via INTRAVENOUS
  Filled 2023-03-27 (×7): qty 20

## 2023-03-27 MED ORDER — AMIODARONE HCL IN DEXTROSE 360-4.14 MG/200ML-% IV SOLN
60.0000 mg/h | INTRAVENOUS | Status: DC
  Administered 2023-03-27 (×2): 60 mg/h via INTRAVENOUS
  Filled 2023-03-27 (×2): qty 200

## 2023-03-27 MED ORDER — CHLORHEXIDINE GLUCONATE CLOTH 2 % EX PADS
6.0000 | MEDICATED_PAD | Freq: Every day | CUTANEOUS | Status: DC
  Administered 2023-03-27 – 2023-04-08 (×8): 6 via TOPICAL

## 2023-03-27 MED ORDER — PROCHLORPERAZINE EDISYLATE 10 MG/2ML IJ SOLN
5.0000 mg | Freq: Once | INTRAMUSCULAR | Status: AC | PRN
  Administered 2023-03-27: 5 mg via INTRAVENOUS
  Filled 2023-03-27: qty 2

## 2023-03-27 MED ORDER — POTASSIUM CHLORIDE 10 MEQ/100ML IV SOLN
10.0000 meq | INTRAVENOUS | Status: AC
  Administered 2023-03-28 (×4): 10 meq via INTRAVENOUS
  Filled 2023-03-27 (×4): qty 100

## 2023-03-27 MED ORDER — AMIODARONE LOAD VIA INFUSION
150.0000 mg | Freq: Once | INTRAVENOUS | Status: AC
  Administered 2023-03-27: 150 mg via INTRAVENOUS
  Filled 2023-03-27: qty 83.34

## 2023-03-27 NOTE — Progress Notes (Signed)
PT refused CPAP.  

## 2023-03-27 NOTE — Progress Notes (Signed)
PROGRESS NOTE  Charles Mckinney  MVH:[id number] DOB: December 02, 1951 DOA: 03/24/2023 PCP: Virl Cagey, MD   Brief Narrative: Patient is a 72 year old male from [facility] with history of hypertension, COPD, chronic hypoxic respiratory failure on 2 L of oxygen , CKD stage IIIb, atrial fibrillation on Eliquis, combined systolic/diastolic CHF, OSA, AAA who presents to the emergency department with complaint of dark blood in the stool multiple times.  He noticed dark red blood in diaper multiple times over the past week.  Takes Eliquis.  On presentation, lab work showed potassium of 2.2, hemoglobin 7.4, creatinine of 1.9 . He denies any nausea, abdomen pain or vomiting.  Blood pressure was soft on presentation.  Patient was admitted for the management of  GI bleed, GI consulted.  Plan for EGD/colonoscopy at some point.  Hospital course remarkable for persistent A-fib with RVR, hypokalemia, melanotic stools  Assessment & Plan:  Acute lower GI bleed: Presented with hematochezia, dark stool.  Hemoglobin of 7.4 on presentation.  Given 2 units of blood transfusion.  GI consulted.  Hemoglobin stable in the range of 8 this morning. Will continue monitor H&H.  GI planning for EGD/colonoscopy  Persistent A-fib with RVR: On Eliquis at home and metoprolol for rate control.   Eliquis on hold. Remains in place and A-fib with RVR today.  Blood pressure is soft.  Cardiology consulted  Gram-negative bacteremia: Had mild leukocytosis, became febrile on 4/21.  UA suggestive of UTI.  Blood cultures showing E. coli.  Also has mild erythema bilateral lower extremities.  Started on ceftriaxone.  Follow-up final blood culture report   AKI in CKD stage IIIa: Baseline creatinine was 1.5 ,3 years ago . Presented with creatinine of 1.9.  Most likely prerenal in the setting of blood loss.  antihypertensives on hold.  Kidney function has normalized  Combined systolic/diastolic CHF: .  Diuretics on hold due to low blood pressure,  AKI.  Echo done on 10/2018 showed EF of 30 to 35%, diffuse hypokinesis. Has chronic edema bilateral lower extremities.  Cardiology consulted   Hypokalemia: Being aggressively supplemented and monitored.  Magnesium level normal   OSA: On CPAP   COPD: Not in exacerbation.  Uses 2L of at home.   Elevated alkaline phosphatase: Elevated GGT. liver enzymes normal.  Right upper quadrant ultrasound showed hepatic steatosis  Hyponatremia: Normalized    Debility/deconditioning/obesity: Morbidly obese patient.  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  Ambulates with the help of wheelchair.  Lives at a SNF, [facility] consulted.  Very poor quality of life due to several comorbidities.  Still full code.  Palliaitve care, culture requested.  I also discussed with his son about this    Pressure Injury 03/25/23 Buttocks Right Stage 2 -  Partial thickness loss of dermis presenting as a shallow open injury with a red, pink wound bed without slough. (Active)  03/25/23 1522  Location: Buttocks  Location Orientation: Right  Staging: Stage 2 -  Partial thickness loss of dermis presenting as a shallow open injury with a red, pink wound bed without slough.  Wound Description (Comments):   Present on Admission: Yes  Dressing Type Foam - Lift dressing to assess site every shift 03/26/23 2030     Pressure Injury 03/25/23 Buttocks Right Stage 2 -  Partial thickness loss of dermis presenting as a shallow open injury with a red, pink wound bed without slough. (Active)  03/25/23 1523  Location: Buttocks  Location Orientation: Right  Staging: Stage 2 -  Partial thickness loss of dermis presenting as a shallow open injury with a red, pink wound bed without slough.  Wound Description (Comments):   Present on Admission: Yes  Dressing Type Foam - Lift dressing to assess site every shift 03/26/23 2030    DVT prophylaxis:SCDs Start: 03/25/23 0408     Code Status: Full Code  Family Communication:Called and  discussed with son on phone on 4/22  Patient status: Inpatient  Patient is from : SNF  Anticipated discharge to: SNF   Estimated DC date: 2 to 3 days   Consultants: GI, cardiology, palliative care  Procedures: None  Antimicrobials:  Anti-infectives (From admission, onward)    Start     Dose/Rate Route Frequency Ordered Stop   03/27/23 1000  cefTRIAXone (ROCEPHIN) 2 g in sodium chloride 0.9 % 100 mL IVPB        2 g 200 mL/hr over 30 Minutes Intravenous Every 24 hours 03/27/23 0737     03/26/23 1600  cefTRIAXone (ROCEPHIN) 2 g in sodium chloride 0.9 % 100 mL IVPB  Status:  Discontinued        2 g 200 mL/hr over 30 Minutes Intravenous Every 24 hours 03/26/23 1510 03/27/23 0737   03/26/23 1600  azithromycin (ZITHROMAX) 500 mg in sodium chloride 0.9 % 250 mL IVPB  Status:  Discontinued        500 mg 250 mL/hr over 60 Minutes Intravenous Every 24 hours 03/26/23 1510 03/27/23 0736       Subjective: Patient seen and examined the bedside today.  He was in A-fib with RVR with heart rate in the range of 120.  Blood pressure better today.  He was complaining of pain everywhere but mainly on the upper thighs.  Still will rectal tube.  Denies any nausea, vomiting or abdominal pain.  Objective: Vitals:   03/27/23 0449 03/27/23 0453 03/27/23 0750 03/27/23 1018  BP:  106/62  125/78  Pulse:  100  (!) 104  Resp:  19  18  Temp:  99.2 F (37.3 C)  98.9 F (37.2 C)  TempSrc:  Oral  Oral  SpO2:  98% 97% 98%  Weight: 127.8 kg     Height:        Intake/Output Summary (Last 24 hours) at 03/27/2023 1145 Last data filed at 03/27/2023 2130 Gross per 24 hour  Intake 1300.01 ml  Output 4500 ml  Net -3199.99 ml   Filed Weights   03/25/23 1515 03/26/23 0500 03/27/23 0449  Weight: 129.4 kg 131.2 kg 127.8 kg    Examination:  General exam: Very deconditioned, weak, morbidly obese HEENT: PERRL Respiratory system:  no wheezes or crackles, diminished sounds bilaterally Cardiovascular  system: A-fib with RVR Gastrointestinal system: Abdomen is nondistended, soft and nontender.  Rectal tube Central nervous system: Alert and oriented Extremities: Trace edema to lower extremities , no clubbing ,no cyanosis Skin: Erythematous bilateral  legs,pressure ulcer   Data Reviewed: I have personally reviewed following labs and imaging studies  CBC: Recent Labs  Lab 03/24/23 2255 03/25/23 0600 03/25/23 1010 03/25/23 1534 03/26/23 0731 03/26/23 1652 03/27/23 0524  WBC 12.1*  --   --   --  11.8*  --   --   HGB 7.4* 8.5*  --  9.2* 8.5* 8.7* 8.9*  HCT 25.0* 28.8* 31.5* 31.0* 29.1* 29.9* 30.1*  MCV 84.2  --   --   --  85.6  --   --   PLT 379  --   --   --  388  --   --  Basic Metabolic Panel: Recent Labs  Lab 03/24/23 2255 03/25/23 0600 03/25/23 0859 03/25/23 0950 03/25/23 1534 03/26/23 0731 03/27/23 0523 03/27/23 0524  NA 130* 123*  --   --  133* 135  --  137  K 2.2* 2.6*  --  2.3* 2.6* 3.1*  --  2.6*  CL 91* 83*  --   --  92* 100  --  100  CO2 28 26  --   --  29 26  --  26  GLUCOSE 134* 406*  --   --  113* 121*  --  128*  BUN 57* 49*  --   --  51* 42*  --  35*  CREATININE 1.90* 1.69*  --   --  1.54* 1.37*  --  1.11  CALCIUM 8.1* 7.6*  --   --  8.0* 7.9*  --  8.0*  MG  --  9.7* 2.4  --   --   --  2.3  --      Recent Results (from the past 240 hour(s))  MRSA Next Gen by PCR, Nasal     Status: None   Collection Time: 03/25/23  3:31 PM   Specimen: Nasal Mucosa; Nasal Swab  Result Value Ref Range Status   MRSA by PCR Next Gen NOT DETECTED NOT DETECTED Final    Comment: (NOTE) The GeneXpert MRSA Assay (FDA approved for NASAL specimens only), is one component of a comprehensive MRSA colonization surveillance program. It is not intended to diagnose MRSA infection nor to guide or monitor treatment for MRSA infections. Test performance is not FDA approved in patients less than 44 years old. Performed at [facility], [address], [place], [place] [address]   Culture, blood (Routine X 2) w Reflex to ID Panel     Status: None (Preliminary result)   Collection Time: 03/26/23  2:36 PM   Specimen: BLOOD  Result Value Ref Range Status   Specimen Description   Final    BLOOD BLOOD LEFT ARM Performed at [facility], [address], [place], [place] [address]    Special Requests   Final    AEROBIC BOTTLE ONLY Blood Culture adequate volume Performed at [facility], [address] [place], [place] [address]    Culture  Setup Time   Final    GRAM NEGATIVE RODS AEROBIC BOTTLE ONLY CRITICAL RESULT CALLED TO, READ BACK BY AND VERIFIED WITH: PHARMD N. GLOGOVAC 03/27/23 @ 0625 BY AB Performed at [facility], [address] [place], [place] [address]    Culture GRAM NEGATIVE RODS  Final   Report Status PENDING  Incomplete  Blood Culture ID Panel (Reflexed)     Status: Abnormal   Collection Time: 03/26/23  2:36 PM  Result Value Ref Range Status   Enterococcus faecalis NOT DETECTED NOT DETECTED Final   Enterococcus Faecium NOT DETECTED NOT DETECTED Final   Listeria monocytogenes NOT DETECTED NOT DETECTED Final   Staphylococcus species NOT DETECTED NOT DETECTED Final   Staphylococcus aureus (BCID) NOT DETECTED NOT DETECTED Final   Staphylococcus epidermidis NOT DETECTED NOT DETECTED Final   Staphylococcus lugdunensis NOT DETECTED NOT DETECTED Final   Streptococcus species NOT DETECTED NOT DETECTED Final   Streptococcus agalactiae NOT DETECTED NOT DETECTED Final   Streptococcus pneumoniae NOT DETECTED NOT DETECTED Final   Streptococcus pyogenes NOT DETECTED NOT DETECTED Final   A.calcoaceticus-baumannii NOT DETECTED NOT DETECTED Final   Bacteroides fragilis NOT DETECTED NOT DETECTED Final  Enterobacterales DETECTED (A) NOT DETECTED Final    Comment: Enterobacterales represent a large order of gram negative bacteria, not a single organism. CRITICAL RESULT CALLED TO, READ  BACK BY AND VERIFIED WITH: PHARMD N. GLOGOVAC 03/27/23 @ 0625 BY AB    Enterobacter cloacae complex NOT DETECTED NOT DETECTED Final   Escherichia coli DETECTED (A) NOT DETECTED Final    Comment: CRITICAL RESULT CALLED TO, READ BACK BY AND VERIFIED WITH: PHARMD N. GLOGOVAC 03/27/23 @ 0625 BY AB    Klebsiella aerogenes NOT DETECTED NOT DETECTED Final   Klebsiella oxytoca NOT DETECTED NOT DETECTED Final   Klebsiella pneumoniae NOT DETECTED NOT DETECTED Final   Proteus species NOT DETECTED NOT DETECTED Final   Salmonella species NOT DETECTED NOT DETECTED Final   Serratia marcescens NOT DETECTED NOT DETECTED Final   Haemophilus influenzae NOT DETECTED NOT DETECTED Final   Neisseria meningitidis NOT DETECTED NOT DETECTED Final   Pseudomonas aeruginosa NOT DETECTED NOT DETECTED Final   Stenotrophomonas maltophilia NOT DETECTED NOT DETECTED Final   Candida albicans NOT DETECTED NOT DETECTED Final   Candida auris NOT DETECTED NOT DETECTED Final   Candida glabrata NOT DETECTED NOT DETECTED Final   Candida krusei NOT DETECTED NOT DETECTED Final   Candida parapsilosis NOT DETECTED NOT DETECTED Final   Candida tropicalis NOT DETECTED NOT DETECTED Final   Cryptococcus neoformans/gattii NOT DETECTED NOT DETECTED Final   CTX-M ESBL NOT DETECTED NOT DETECTED Final   Carbapenem resistance IMP NOT DETECTED NOT DETECTED Final   Carbapenem resistance KPC NOT DETECTED NOT DETECTED Final   Carbapenem resistance NDM NOT DETECTED NOT DETECTED Final   Carbapenem resist OXA 48 LIKE NOT DETECTED NOT DETECTED Final   Carbapenem resistance VIM NOT DETECTED NOT DETECTED Final    Comment: Performed at [facility], [address], [place], [place] [address]  Culture, blood (Routine X 2) w Reflex to ID Panel     Status: None (Preliminary result)   Collection Time: 03/26/23  4:52 PM   Specimen: BLOOD  Result Value Ref Range Status   Specimen Description   Final    BLOOD BLOOD LEFT ARM Performed at [facility], [address] [place], [place] [address]    Special Requests   Final    AEROBIC BOTTLE ONLY Blood Culture adequate volume Performed at [facility], [address], [place], [place] [address]    Culture   Final    NO GROWTH < 12 HOURS Performed at [facility], [address] [place], [place] [address]    Report Status PENDING  Incomplete     Radiology Studies: US Abdomen Limited RUQ (LIVER/GB)  Result Date: 03/27/2023 CLINICAL DATA:  Elevated LFTs EXAM: ULTRASOUND ABDOMEN LIMITED RIGHT UPPER QUADRANT COMPARISON:  CT abdomen and pelvis with contrast September 21, 2015 FINDINGS: Gallbladder: Cholelithiasis, measuring up to 7 mm. No gallbladder wall thickening or pericholecystic fluid. No sonographic Murphy sign noted by sonographer. Common bile duct: Diameter: 5 mm Liver: No focal lesion identified. Increased hepatic parenchymal echogenicity. Portal vein is patent on color Doppler imaging with normal direction of blood flow towards the liver. Other: None. IMPRESSION: 1. Cholelithiasis without sonographic evidence of acute cholecystitis. 2. Increased hepatic parenchymal echogenicity, which is nonspecific, but most commonly seen in the setting of hepatic steatosis. Electronically Signed   By: Jacob Moores M.D.   On: 03/27/2023 08:11   DG CHEST PORT 1 VIEW  Result Date: 03/26/2023 CLINICAL DATA:  Cough EXAM: PORTABLE  CHEST 1 VIEW COMPARISON:  X-ray 07/19/2019 and older FINDINGS: Film is rotated to the left. Enlarged cardiopericardial silhouette with sternal wires. Calcified aorta. Atrial occlusion clip. Vascular congestion. Small left effusion with some opacity. No pneumothorax. Film is under penetrated. IMPRESSION: Postop chest. Enlarged heart with vascular congestion. Atrial occlusion clip. Small left effusion with adjacent opacity. Infiltrates not excluded. Recommend follow-up. Under penetrated and rotated radiograph Electronically Signed    By: Karen Kays M.D.   On: 03/26/2023 14:17    Scheduled Meds:  levothyroxine  100 mcg Oral Q0600   metoprolol tartrate  12.5 mg Oral BID   mometasone-formoterol  2 puff Inhalation BID   pantoprazole (PROTONIX) IV  40 mg Intravenous Q12H   sodium chloride flush  3 mL Intravenous Q12H   Continuous Infusions:  cefTRIAXone (ROCEPHIN)  IV 2 g (03/27/23 1026)   potassium chloride 10 mEq (03/27/23 1016)     LOS: 2 days   Burnadette Pop, MD [facility] P4/22/2024, 11:45 AM

## 2023-03-27 NOTE — Progress Notes (Signed)
[facility] provided emotional and spiritual support for patient and son who was bedside. No needs expressed to this author. Patient has expressed to medical staff that he is tired but unsure what this means due to support system being present. Will need to follow up.  Casilda Carls, M.Div. Healthcare Chaplain

## 2023-03-27 NOTE — Progress Notes (Signed)
Per report from previous RN, patient needs to finish bowel prep for tomorrow morning for scheduled procedure. Served prep in cup and at bedside with patient, encouraged intake but patient states he is tired and is falling asleep. Education provided to patient and patient verbalizes understanding of importance.

## 2023-03-27 NOTE — H&P (View-Only) (Signed)
Subjective: EGD and colonoscopy, initially scheduled for today was canceled as patient had severe hypokalemia, potassium 2.6, was in A-fib with RVR, heart rate up to 142/min with a low blood pressure of 95 /60 mmHg.  Objective: Vital signs in last 24 hours: Temp:  [97.8 F (36.6 C)-100.9 F (38.3 C)] 98.9 F (37.2 C) (04/22 1018) Pulse Rate:  [45-142] 104 (04/22 1018) Resp:  [14-20] 18 (04/22 1018) BP: (92-125)/(54-78) 125/78 (04/22 1018) SpO2:  [97 %-100 %] 98 % (04/22 1018) Weight:  [127.8 kg] 127.8 kg (04/22 0449) Weight change: -1.6 kg Last BM Date : 03/26/23  PE: On oxygen via nasal cannula, alert, oriented x 3 GENERAL: Obese, prominent pallor  ABDOMEN: Nondistended, nontender, normoactive bowel sounds EXTREMITIES: No edema  Lab Results: Results for orders placed or performed during the hospital encounter of 03/24/23 (from the past 48 hour(s))  Sodium, urine, random     Status: None   Collection Time: 03/25/23 11:43 AM  Result Value Ref Range   Sodium, Ur 62 mmol/L    Comment: Performed at [facility], [address] [place], [place] [address]  MRSA Next Gen by PCR, Nasal     Status: None   Collection Time: 03/25/23  3:31 PM   Specimen: Nasal Mucosa; Nasal Swab  Result Value Ref Range   MRSA by PCR Next Gen NOT DETECTED NOT DETECTED    Comment: (NOTE) The GeneXpert MRSA Assay (FDA approved for NASAL specimens only), is one component of a comprehensive MRSA colonization surveillance program. It is not intended to diagnose MRSA infection nor to guide or monitor treatment for MRSA infections. Test performance is not FDA approved in patients less than 73 years old. Performed at [facility], [address] [place], [place] [address]   Basic metabolic panel     Status: Abnormal   Collection Time: 03/25/23  3:34 PM  Result Value Ref Range   Sodium 133 (L) 135 - 145 mmol/L   Potassium 2.6 (LL) 3.5 - 5.1 mmol/L    Comment: CRITICAL  RESULT CALLED TO, READ BACK BY AND VERIFIED WITH DICKEY, E RN AT 1635 03/25/23 BY TIBBITTS, K    Chloride 92 (L) 98 - 111 mmol/L   CO2 29 22 - 32 mmol/L   Glucose, Bld 113 (H) 70 - 99 mg/dL    Comment: Glucose reference range applies only to samples taken after fasting for at least 8 hours.   BUN 51 (H) 8 - 23 mg/dL   Creatinine, Ser 0.98 (H) 0.61 - 1.24 mg/dL   Calcium 8.0 (L) 8.9 - 10.3 mg/dL   GFR, Estimated 48 (L) >60 mL/min    Comment: (NOTE) Calculated using the CKD-EPI Creatinine Equation (2021)    Anion gap 12 5 - 15    Comment: Performed at [facility], [address] [place], [place] [address]  Hemoglobin and hematocrit, blood     Status: Abnormal   Collection Time: 03/25/23  3:34 PM  Result Value Ref Range   Hemoglobin 9.2 (L) 13.0 - 17.0 g/dL   HCT 78.2 (L) 95.6 - 21.3 %    Comment: Performed at [facility], [address], [place], [place] [address]  Comprehensive metabolic panel     Status: Abnormal   Collection Time: 03/26/23  7:31 AM  Result Value Ref Range   Sodium 135 135 - 145 mmol/L   Potassium 3.1 (L) 3.5 - 5.1 mmol/L   Chloride 100 98 - 111 mmol/L  CO2 26 22 - 32 mmol/L   Glucose, Bld 121 (H) 70 - 99 mg/dL    Comment: Glucose reference range applies only to samples taken after fasting for at least 8 hours.   BUN 42 (H) 8 - 23 mg/dL   Creatinine, Ser 4.09 (H) 0.61 - 1.24 mg/dL   Calcium 7.9 (L) 8.9 - 10.3 mg/dL   Total Protein 5.3 (L) 6.5 - 8.1 g/dL   Albumin 1.7 (L) 3.5 - 5.0 g/dL   AST 29 15 - 41 U/L   ALT 26 0 - 44 U/L   Alkaline Phosphatase 334 (H) 38 - 126 U/L   Total Bilirubin 0.7 0.3 - 1.2 mg/dL   GFR, Estimated 55 (L) >60 mL/min    Comment: (NOTE) Calculated using the CKD-EPI Creatinine Equation (2021)    Anion gap 9 5 - 15    Comment: Performed at [facility], [address], [place], [place] [address]  CBC     Status: Abnormal   Collection Time: 03/26/23  7:31 AM  Result Value Ref Range    WBC 11.8 (H) 4.0 - 10.5 K/uL   RBC 3.40 (L) 4.22 - 5.81 MIL/uL   Hemoglobin 8.5 (L) 13.0 - 17.0 g/dL   HCT 47.8 (L) 29.5 - 62.1 %   MCV 85.6 80.0 - 100.0 fL   MCH 25.0 (L) 26.0 - 34.0 pg   MCHC 29.2 (L) 30.0 - 36.0 g/dL   RDW 30.8 (H) 65.7 - 84.6 %   Platelets 388 150 - 400 K/uL   nRBC 0.0 0.0 - 0.2 %    Comment: Performed at [facility], [address], [place], [place] [address]  Urinalysis, Routine w reflex microscopic -Urine, Clean Catch     Status: Abnormal   Collection Time: 03/26/23 10:39 AM  Result Value Ref Range   Color, Urine YELLOW YELLOW   APPearance CLOUDY (A) CLEAR   Specific Gravity, Urine 1.012 1.005 - 1.030   pH 5.0 5.0 - 8.0   Glucose, UA NEGATIVE NEGATIVE mg/dL   Hgb urine dipstick MODERATE (A) NEGATIVE   Bilirubin Urine NEGATIVE NEGATIVE   Ketones, ur NEGATIVE NEGATIVE mg/dL   Protein, ur NEGATIVE NEGATIVE mg/dL   Nitrite NEGATIVE NEGATIVE   Leukocytes,Ua LARGE (A) NEGATIVE   RBC / HPF 11-20 0 - 5 RBC/hpf   WBC, UA >50 0 - 5 WBC/hpf   Bacteria, UA MANY (A) NONE SEEN   Squamous Epithelial / HPF 0-5 0 - 5 /HPF   WBC Clumps PRESENT    Amorphous Crystal PRESENT     Comment: Performed at [facility], [address] [place], [place] [address]  Culture, blood (Routine X 2) w Reflex to ID Panel     Status: None (Preliminary result)   Collection Time: 03/26/23  2:36 PM   Specimen: BLOOD  Result Value Ref Range   Specimen Description      BLOOD BLOOD LEFT ARM Performed at [facility], [address], [place], [place] [address]    Special Requests      AEROBIC BOTTLE ONLY Blood Culture adequate volume Performed at [facility], [address], [place], [place] [address]    Culture  Setup Time      GRAM NEGATIVE RODS AEROBIC BOTTLE ONLY CRITICAL RESULT CALLED TO, READ BACK BY AND VERIFIED WITH: PHARMD N. GLOGOVAC 03/27/23 @ 0625 BY AB Performed at [facility], [address] [place], [place] [address]  Culture GRAM NEGATIVE RODS    Report Status PENDING   Blood Culture ID Panel (Reflexed)     Status: Abnormal   Collection Time: 03/26/23  2:36 PM  Result Value Ref Range   Enterococcus faecalis NOT DETECTED NOT DETECTED   Enterococcus Faecium NOT DETECTED NOT DETECTED   Listeria monocytogenes NOT DETECTED NOT DETECTED   Staphylococcus species NOT DETECTED NOT DETECTED   Staphylococcus aureus (BCID) NOT DETECTED NOT DETECTED   Staphylococcus epidermidis NOT DETECTED NOT DETECTED   Staphylococcus lugdunensis NOT DETECTED NOT DETECTED   Streptococcus species NOT DETECTED NOT DETECTED   Streptococcus agalactiae NOT DETECTED NOT DETECTED   Streptococcus pneumoniae NOT DETECTED NOT DETECTED   Streptococcus pyogenes NOT DETECTED NOT DETECTED   A.calcoaceticus-baumannii NOT DETECTED NOT DETECTED   Bacteroides fragilis NOT DETECTED NOT DETECTED   Enterobacterales DETECTED (A) NOT DETECTED    Comment: Enterobacterales represent a large order of gram negative bacteria, not a single organism. CRITICAL RESULT CALLED TO, READ BACK BY AND VERIFIED WITH: PHARMD N. GLOGOVAC 03/27/23 @ 0625 BY AB    Enterobacter cloacae complex NOT DETECTED NOT DETECTED   Escherichia coli DETECTED (A) NOT DETECTED    Comment: CRITICAL RESULT CALLED TO, READ BACK BY AND VERIFIED WITH: PHARMD N. GLOGOVAC 03/27/23 @ 0625 BY AB    Klebsiella aerogenes NOT DETECTED NOT DETECTED   Klebsiella oxytoca NOT DETECTED NOT DETECTED   Klebsiella pneumoniae NOT DETECTED NOT DETECTED   Proteus species NOT DETECTED NOT DETECTED   Salmonella species NOT DETECTED NOT DETECTED   Serratia marcescens NOT DETECTED NOT DETECTED   Haemophilus influenzae NOT DETECTED NOT DETECTED   Neisseria meningitidis NOT DETECTED NOT DETECTED   Pseudomonas aeruginosa NOT DETECTED NOT DETECTED   Stenotrophomonas maltophilia NOT DETECTED NOT DETECTED   Candida albicans NOT DETECTED NOT DETECTED   Candida auris NOT  DETECTED NOT DETECTED   Candida glabrata NOT DETECTED NOT DETECTED   Candida krusei NOT DETECTED NOT DETECTED   Candida parapsilosis NOT DETECTED NOT DETECTED   Candida tropicalis NOT DETECTED NOT DETECTED   Cryptococcus neoformans/gattii NOT DETECTED NOT DETECTED   CTX-M ESBL NOT DETECTED NOT DETECTED   Carbapenem resistance IMP NOT DETECTED NOT DETECTED   Carbapenem resistance KPC NOT DETECTED NOT DETECTED   Carbapenem resistance NDM NOT DETECTED NOT DETECTED   Carbapenem resist OXA 48 LIKE NOT DETECTED NOT DETECTED   Carbapenem resistance VIM NOT DETECTED NOT DETECTED    Comment: Performed at [facility], [address] [place], [place] [address]  Hemoglobin and hematocrit, blood     Status: Abnormal   Collection Time: 03/26/23  4:52 PM  Result Value Ref Range   Hemoglobin 8.7 (L) 13.0 - 17.0 g/dL   HCT 62.1 (L) 30.8 - 65.7 %    Comment: Performed at [facility], [address], [place], [place] [address]  Culture, blood (Routine X 2) w Reflex to ID Panel     Status: None (Preliminary result)   Collection Time: 03/26/23  4:52 PM   Specimen: BLOOD  Result Value Ref Range   Specimen Description      BLOOD BLOOD LEFT ARM Performed at [facility], [address] [place], [place] [address]    Special Requests      AEROBIC BOTTLE ONLY Blood Culture adequate volume Performed at [facility], [address], [place], [place] [address]    Culture      NO GROWTH < 12 HOURS Performed at [facility], [address] [place], [place] [address]    Report Status PENDING   Magnesium     Status: None   Collection Time: 03/27/23  5:23 AM  Result Value Ref Range   Magnesium 2.3 1.7 - 2.4 mg/dL    Comment: Performed at [facility], [address], [place], [place] [address]  Comprehensive metabolic panel     Status: Abnormal   Collection Time: 03/27/23  5:24 AM  Result Value Ref Range   Sodium  137 135 - 145 mmol/L   Potassium 2.6 (LL) 3.5 - 5.1 mmol/L    Comment: CRITICAL RESULT CALLED TO, READ BACK BY AND VERIFIED WITH SELARCA,J. RN AT 0981 03/27/23 MULLINS,T    Chloride 100 98 - 111 mmol/L   CO2 26 22 - 32 mmol/L   Glucose, Bld 128 (H) 70 - 99 mg/dL    Comment: Glucose reference range applies only to samples taken after fasting for at least 8 hours.   BUN 35 (H) 8 - 23 mg/dL   Creatinine, Ser 1.91 0.61 - 1.24 mg/dL   Calcium 8.0 (L) 8.9 - 10.3 mg/dL   Total Protein 5.8 (L) 6.5 - 8.1 g/dL   Albumin 2.0 (L) 3.5 - 5.0 g/dL   AST 23 15 - 41 U/L   ALT 23 0 - 44 U/L   Alkaline Phosphatase 289 (H) 38 - 126 U/L   Total Bilirubin 0.6 0.3 - 1.2 mg/dL   GFR, Estimated >47 >82 mL/min    Comment: (NOTE) Calculated using the CKD-EPI Creatinine Equation (2021)    Anion gap 11 5 - 15    Comment: Performed at [facility], [address], [place], [place] [address]  Hemoglobin and hematocrit, blood     Status: Abnormal   Collection Time: 03/27/23  5:24 AM  Result Value Ref Range   Hemoglobin 8.9 (L) 13.0 - 17.0 g/dL   HCT 30.8 (L) 65.7 - 84.6 %    Comment: Performed at [facility], [address], [place], [place] [address]    Studies/Results: US Abdomen Limited RUQ (LIVER/GB)  Result Date: 03/27/2023 CLINICAL DATA:  Elevated LFTs EXAM: ULTRASOUND ABDOMEN LIMITED RIGHT UPPER QUADRANT COMPARISON:  CT abdomen and pelvis with contrast September 21, 2015 FINDINGS: Gallbladder: Cholelithiasis, measuring up to 7 mm. No gallbladder wall thickening or pericholecystic fluid. No sonographic Murphy sign noted by sonographer. Common bile duct: Diameter: 5 mm Liver: No focal lesion identified. Increased hepatic parenchymal echogenicity. Portal vein is patent on color Doppler imaging with normal direction of blood flow towards the liver. Other: None. IMPRESSION: 1. Cholelithiasis without sonographic evidence of acute cholecystitis. 2. Increased hepatic  parenchymal echogenicity, which is nonspecific, but most commonly seen in the setting of hepatic steatosis. Electronically Signed   By: Jacob Moores M.D.   On: 03/27/2023 08:11   DG CHEST PORT 1 VIEW  Result Date: 03/26/2023 CLINICAL DATA:  Cough EXAM: PORTABLE CHEST 1 VIEW COMPARISON:  X-ray 07/19/2019 and older FINDINGS: Film is rotated to the left. Enlarged cardiopericardial silhouette with sternal wires. Calcified aorta. Atrial occlusion clip. Vascular congestion. Small left effusion with some opacity. No pneumothorax. Film is under penetrated. IMPRESSION: Postop chest. Enlarged heart with vascular congestion. Atrial occlusion clip. Small left effusion with adjacent opacity. Infiltrates not excluded. Recommend follow-up. Under penetrated and rotated radiograph Electronically Signed   By: Karen Kays M.D.   On: 03/26/2023 14:17    Medications: I have reviewed the patient's current medications.  Assessment: Black and bloody stools, hemoglobin stable at 8.9  Escherichia coli/Enterobacterales bacteremia, he has been started on IV ceftriaxone 2 g every 24 hours  Severe hypokalemia, potassium 2.6  A-fib with RVR, Eliquis on hold  COPD, chronic hypoxic respiratory failure on 2 L oxygen via nasal cannula  Combined systolic and diastolic CHF  Obstructive sleep apnea, refuses CPAP at nighttime  History of AAA  Elevated alk phos, 289 today, ultrasound showed increased hepatic parenchymal echogenicity, hepatic steatosis, cholelithiasis, 7 mm, normal CBD 5 mm   Plan: Endoscopic procedures, EGD/colonoscopy postponed till tomorrow. I will put patient on clear liquids and encouraged him to finish second gallon of colonic prep.   Patient is being transferred to progressive unit for closer monitoring as he has hypotension, tachycardia, A-fib with RVR and severe hypokalemia. His hospitalist, Dr. Renford Dills will be requesting a cardiology consult/evaluation for further management.   Kerin Salen,  MD 03/27/2023, 10:18 AM

## 2023-03-27 NOTE — Progress Notes (Signed)
PHARMACY - PHYSICIAN COMMUNICATION CRITICAL VALUE ALERT - BLOOD CULTURE IDENTIFICATION (BCID)  Charles Mckinney is an 72 y.o. male who presented to [facility] on 03/24/2023 with a chief complaint of  Chief Complaint  Patient presents with   Rectal Bleeding     Assessment:  possible of urinary origin  Name of physician (or Provider) Contacted:  - Dr. Katherine Basset  Current antibiotics: azithromycin and ceftriaxone  Changes to prescribed antibiotics recommended:  - Ok to stop azithromycin - continue ceftriaxone 2 gr IV q24h   Results for orders placed or performed during the hospital encounter of 03/24/23  Blood Culture ID Panel (Reflexed) (Collected: 03/26/2023  2:36 PM)  Result Value Ref Range   Enterococcus faecalis NOT DETECTED NOT DETECTED   Enterococcus Faecium NOT DETECTED NOT DETECTED   Listeria monocytogenes NOT DETECTED NOT DETECTED   Staphylococcus species NOT DETECTED NOT DETECTED   Staphylococcus aureus (BCID) NOT DETECTED NOT DETECTED   Staphylococcus epidermidis NOT DETECTED NOT DETECTED   Staphylococcus lugdunensis NOT DETECTED NOT DETECTED   Streptococcus species NOT DETECTED NOT DETECTED   Streptococcus agalactiae NOT DETECTED NOT DETECTED   Streptococcus pneumoniae NOT DETECTED NOT DETECTED   Streptococcus pyogenes NOT DETECTED NOT DETECTED   A.calcoaceticus-baumannii NOT DETECTED NOT DETECTED   Bacteroides fragilis NOT DETECTED NOT DETECTED   Enterobacterales DETECTED (A) NOT DETECTED   Enterobacter cloacae complex NOT DETECTED NOT DETECTED   Escherichia coli DETECTED (A) NOT DETECTED   Klebsiella aerogenes NOT DETECTED NOT DETECTED   Klebsiella oxytoca NOT DETECTED NOT DETECTED   Klebsiella pneumoniae NOT DETECTED NOT DETECTED   Proteus species NOT DETECTED NOT DETECTED   Salmonella species NOT DETECTED NOT DETECTED   Serratia marcescens NOT DETECTED NOT DETECTED   Haemophilus influenzae NOT DETECTED NOT DETECTED   Neisseria meningitidis NOT DETECTED NOT  DETECTED   Pseudomonas aeruginosa NOT DETECTED NOT DETECTED   Stenotrophomonas maltophilia NOT DETECTED NOT DETECTED   Candida albicans NOT DETECTED NOT DETECTED   Candida auris NOT DETECTED NOT DETECTED   Candida glabrata NOT DETECTED NOT DETECTED   Candida krusei NOT DETECTED NOT DETECTED   Candida parapsilosis NOT DETECTED NOT DETECTED   Candida tropicalis NOT DETECTED NOT DETECTED   Cryptococcus neoformans/gattii NOT DETECTED NOT DETECTED   CTX-M ESBL NOT DETECTED NOT DETECTED   Carbapenem resistance IMP NOT DETECTED NOT DETECTED   Carbapenem resistance KPC NOT DETECTED NOT DETECTED   Carbapenem resistance NDM NOT DETECTED NOT DETECTED   Carbapenem resist OXA 48 LIKE NOT DETECTED NOT DETECTED   Carbapenem resistance VIM NOT DETECTED NOT DETECTED   Adalberto Cole, PharmD, BCPS 03/27/2023 7:38 AM

## 2023-03-27 NOTE — Progress Notes (Signed)
       Overnight   NAME: ALGIE WESTRY MRN: [id number] DOB : July 08, 1951    Date of Service   03/27/2023   HPI/Events of Note   Notified by RN for patient refusal to take PO potassium. Ok with IV     Interventions/ Plan   40 mEq Potassium IV to replace the PO dose that patient did not want to take. AM Lab draw already ordered       Chinita Greenland BSN MSNA MSN ACNPC-AG Acute Care Nurse Practitioner [facility]

## 2023-03-27 NOTE — Progress Notes (Signed)
Subjective: EGD and colonoscopy, initially scheduled for today was canceled as patient had severe hypokalemia, potassium 2.6, was in A-fib with RVR, heart rate up to 142/min with a low blood pressure of 95 /60 mmHg.  Objective: Vital signs in last 24 hours: Temp:  [97.8 F (36.6 C)-100.9 F (38.3 C)] 98.9 F (37.2 C) (04/22 1018) Pulse Rate:  [45-142] 104 (04/22 1018) Resp:  [14-20] 18 (04/22 1018) BP: (92-125)/(54-78) 125/78 (04/22 1018) SpO2:  [97 %-100 %] 98 % (04/22 1018) Weight:  [127.8 kg] 127.8 kg (04/22 0449) Weight change: -1.6 kg Last BM Date : 03/26/23  PE: On oxygen via nasal cannula, alert, oriented x 3 GENERAL: Obese, prominent pallor  ABDOMEN: Nondistended, nontender, normoactive bowel sounds EXTREMITIES: No edema  Lab Results: Results for orders placed or performed during the hospital encounter of 03/24/23 (from the past 48 hour(s))  Sodium, urine, random     Status: None   Collection Time: 03/25/23 11:43 AM  Result Value Ref Range   Sodium, Ur 62 mmol/L    Comment: Performed at [facility], [address] [place], [place] [address]  MRSA Next Gen by PCR, Nasal     Status: None   Collection Time: 03/25/23  3:31 PM   Specimen: Nasal Mucosa; Nasal Swab  Result Value Ref Range   MRSA by PCR Next Gen NOT DETECTED NOT DETECTED    Comment: (NOTE) The GeneXpert MRSA Assay (FDA approved for NASAL specimens only), is one component of a comprehensive MRSA colonization surveillance program. It is not intended to diagnose MRSA infection nor to guide or monitor treatment for MRSA infections. Test performance is not FDA approved in patients less than 73 years old. Performed at [facility], [address] [place], [place] [address]   Basic metabolic panel     Status: Abnormal   Collection Time: 03/25/23  3:34 PM  Result Value Ref Range   Sodium 133 (L) 135 - 145 mmol/L   Potassium 2.6 (LL) 3.5 - 5.1 mmol/L    Comment: CRITICAL  RESULT CALLED TO, READ BACK BY AND VERIFIED WITH DICKEY, E RN AT 1635 03/25/23 BY TIBBITTS, K    Chloride 92 (L) 98 - 111 mmol/L   CO2 29 22 - 32 mmol/L   Glucose, Bld 113 (H) 70 - 99 mg/dL    Comment: Glucose reference range applies only to samples taken after fasting for at least 8 hours.   BUN 51 (H) 8 - 23 mg/dL   Creatinine, Ser 0.98 (H) 0.61 - 1.24 mg/dL   Calcium 8.0 (L) 8.9 - 10.3 mg/dL   GFR, Estimated 48 (L) >60 mL/min    Comment: (NOTE) Calculated using the CKD-EPI Creatinine Equation (2021)    Anion gap 12 5 - 15    Comment: Performed at [facility], [address] [place], [place] [address]  Hemoglobin and hematocrit, blood     Status: Abnormal   Collection Time: 03/25/23  3:34 PM  Result Value Ref Range   Hemoglobin 9.2 (L) 13.0 - 17.0 g/dL   HCT 78.2 (L) 95.6 - 21.3 %    Comment: Performed at [facility], [address], [place], [place] [address]  Comprehensive metabolic panel     Status: Abnormal   Collection Time: 03/26/23  7:31 AM  Result Value Ref Range   Sodium 135 135 - 145 mmol/L   Potassium 3.1 (L) 3.5 - 5.1 mmol/L   Chloride 100 98 - 111 mmol/L  CO2 26 22 - 32 mmol/L   Glucose, Bld 121 (H) 70 - 99 mg/dL    Comment: Glucose reference range applies only to samples taken after fasting for at least 8 hours.   BUN 42 (H) 8 - 23 mg/dL   Creatinine, Ser 9.60 (H) 0.61 - 1.24 mg/dL   Calcium 7.9 (L) 8.9 - 10.3 mg/dL   Total Protein 5.3 (L) 6.5 - 8.1 g/dL   Albumin 1.7 (L) 3.5 - 5.0 g/dL   AST 29 15 - 41 U/L   ALT 26 0 - 44 U/L   Alkaline Phosphatase 334 (H) 38 - 126 U/L   Total Bilirubin 0.7 0.3 - 1.2 mg/dL   GFR, Estimated 55 (L) >60 mL/min    Comment: (NOTE) Calculated using the CKD-EPI Creatinine Equation (2021)    Anion gap 9 5 - 15    Comment: Performed at [facility], [address] [place], [place] [address]  CBC     Status: Abnormal   Collection Time: 03/26/23  7:31 AM  Result Value Ref Range    WBC 11.8 (H) 4.0 - 10.5 K/uL   RBC 3.40 (L) 4.22 - 5.81 MIL/uL   Hemoglobin 8.5 (L) 13.0 - 17.0 g/dL   HCT 81.1 (L) 91.4 - 78.2 %   MCV 85.6 80.0 - 100.0 fL   MCH 25.0 (L) 26.0 - 34.0 pg   MCHC 29.2 (L) 30.0 - 36.0 g/dL   RDW 95.6 (H) 21.3 - 08.6 %   Platelets 388 150 - 400 K/uL   nRBC 0.0 0.0 - 0.2 %    Comment: Performed at [facility], [address] [place], [place] [address]  Urinalysis, Routine w reflex microscopic -Urine, Clean Catch     Status: Abnormal   Collection Time: 03/26/23 10:39 AM  Result Value Ref Range   Color, Urine YELLOW YELLOW   APPearance CLOUDY (A) CLEAR   Specific Gravity, Urine 1.012 1.005 - 1.030   pH 5.0 5.0 - 8.0   Glucose, UA NEGATIVE NEGATIVE mg/dL   Hgb urine dipstick MODERATE (A) NEGATIVE   Bilirubin Urine NEGATIVE NEGATIVE   Ketones, ur NEGATIVE NEGATIVE mg/dL   Protein, ur NEGATIVE NEGATIVE mg/dL   Nitrite NEGATIVE NEGATIVE   Leukocytes,Ua LARGE (A) NEGATIVE   RBC / HPF 11-20 0 - 5 RBC/hpf   WBC, UA >50 0 - 5 WBC/hpf   Bacteria, UA MANY (A) NONE SEEN   Squamous Epithelial / HPF 0-5 0 - 5 /HPF   WBC Clumps PRESENT    Amorphous Crystal PRESENT     Comment: Performed at [facility], [address], [place], [place] [address]  Culture, blood (Routine X 2) w Reflex to ID Panel     Status: None (Preliminary result)   Collection Time: 03/26/23  2:36 PM   Specimen: BLOOD  Result Value Ref Range   Specimen Description      BLOOD BLOOD LEFT ARM Performed at [facility], [address], [place], [place] [address]    Special Requests      AEROBIC BOTTLE ONLY Blood Culture adequate volume Performed at [facility], [address], [place], [place] [address]    Culture  Setup Time      GRAM NEGATIVE RODS AEROBIC BOTTLE ONLY CRITICAL RESULT CALLED TO, READ BACK BY AND VERIFIED WITH: PHARMD N. GLOGOVAC 03/27/23 @ 0625 BY AB Performed at [facility], [address] [place], [place] [address]  Culture GRAM NEGATIVE RODS    Report Status PENDING   Blood Culture ID Panel (Reflexed)     Status: Abnormal   Collection Time: 03/26/23  2:36 PM  Result Value Ref Range   Enterococcus faecalis NOT DETECTED NOT DETECTED   Enterococcus Faecium NOT DETECTED NOT DETECTED   Listeria monocytogenes NOT DETECTED NOT DETECTED   Staphylococcus species NOT DETECTED NOT DETECTED   Staphylococcus aureus (BCID) NOT DETECTED NOT DETECTED   Staphylococcus epidermidis NOT DETECTED NOT DETECTED   Staphylococcus lugdunensis NOT DETECTED NOT DETECTED   Streptococcus species NOT DETECTED NOT DETECTED   Streptococcus agalactiae NOT DETECTED NOT DETECTED   Streptococcus pneumoniae NOT DETECTED NOT DETECTED   Streptococcus pyogenes NOT DETECTED NOT DETECTED   A.calcoaceticus-baumannii NOT DETECTED NOT DETECTED   Bacteroides fragilis NOT DETECTED NOT DETECTED   Enterobacterales DETECTED (A) NOT DETECTED    Comment: Enterobacterales represent a large order of gram negative bacteria, not a single organism. CRITICAL RESULT CALLED TO, READ BACK BY AND VERIFIED WITH: PHARMD N. GLOGOVAC 03/27/23 @ 0625 BY AB    Enterobacter cloacae complex NOT DETECTED NOT DETECTED   Escherichia coli DETECTED (A) NOT DETECTED    Comment: CRITICAL RESULT CALLED TO, READ BACK BY AND VERIFIED WITH: PHARMD N. GLOGOVAC 03/27/23 @ 0625 BY AB    Klebsiella aerogenes NOT DETECTED NOT DETECTED   Klebsiella oxytoca NOT DETECTED NOT DETECTED   Klebsiella pneumoniae NOT DETECTED NOT DETECTED   Proteus species NOT DETECTED NOT DETECTED   Salmonella species NOT DETECTED NOT DETECTED   Serratia marcescens NOT DETECTED NOT DETECTED   Haemophilus influenzae NOT DETECTED NOT DETECTED   Neisseria meningitidis NOT DETECTED NOT DETECTED   Pseudomonas aeruginosa NOT DETECTED NOT DETECTED   Stenotrophomonas maltophilia NOT DETECTED NOT DETECTED   Candida albicans NOT DETECTED NOT DETECTED   Candida auris NOT  DETECTED NOT DETECTED   Candida glabrata NOT DETECTED NOT DETECTED   Candida krusei NOT DETECTED NOT DETECTED   Candida parapsilosis NOT DETECTED NOT DETECTED   Candida tropicalis NOT DETECTED NOT DETECTED   Cryptococcus neoformans/gattii NOT DETECTED NOT DETECTED   CTX-M ESBL NOT DETECTED NOT DETECTED   Carbapenem resistance IMP NOT DETECTED NOT DETECTED   Carbapenem resistance KPC NOT DETECTED NOT DETECTED   Carbapenem resistance NDM NOT DETECTED NOT DETECTED   Carbapenem resist OXA 48 LIKE NOT DETECTED NOT DETECTED   Carbapenem resistance VIM NOT DETECTED NOT DETECTED    Comment: Performed at [facility], [address], [place], [place] [address]  Hemoglobin and hematocrit, blood     Status: Abnormal   Collection Time: 03/26/23  4:52 PM  Result Value Ref Range   Hemoglobin 8.7 (L) 13.0 - 17.0 g/dL   HCT 21.3 (L) 08.6 - 57.8 %    Comment: Performed at [facility], [address] [place], [place] [address]  Culture, blood (Routine X 2) w Reflex to ID Panel     Status: None (Preliminary result)   Collection Time: 03/26/23  4:52 PM   Specimen: BLOOD  Result Value Ref Range   Specimen Description      BLOOD BLOOD LEFT ARM Performed at [facility], [address] [place], [place] [address]    Special Requests      AEROBIC BOTTLE ONLY Blood Culture adequate volume Performed at [facility], [address], [place], [place] [address]    Culture      NO GROWTH < 12 HOURS Performed at [facility], [address], [place], [place] [address]    Report Status PENDING   Magnesium     Status: None   Collection Time: 03/27/23  5:23 AM  Result Value Ref Range   Magnesium 2.3 1.7 - 2.4 mg/dL    Comment: Performed at [facility], [address] [place], [place] [address]  Comprehensive metabolic panel     Status: Abnormal   Collection Time: 03/27/23  5:24 AM  Result Value Ref Range   Sodium  137 135 - 145 mmol/L   Potassium 2.6 (LL) 3.5 - 5.1 mmol/L    Comment: CRITICAL RESULT CALLED TO, READ BACK BY AND VERIFIED WITH SELARCA,J. RN AT 1914 03/27/23 MULLINS,T    Chloride 100 98 - 111 mmol/L   CO2 26 22 - 32 mmol/L   Glucose, Bld 128 (H) 70 - 99 mg/dL    Comment: Glucose reference range applies only to samples taken after fasting for at least 8 hours.   BUN 35 (H) 8 - 23 mg/dL   Creatinine, Ser 7.82 0.61 - 1.24 mg/dL   Calcium 8.0 (L) 8.9 - 10.3 mg/dL   Total Protein 5.8 (L) 6.5 - 8.1 g/dL   Albumin 2.0 (L) 3.5 - 5.0 g/dL   AST 23 15 - 41 U/L   ALT 23 0 - 44 U/L   Alkaline Phosphatase 289 (H) 38 - 126 U/L   Total Bilirubin 0.6 0.3 - 1.2 mg/dL   GFR, Estimated >95 >62 mL/min    Comment: (NOTE) Calculated using the CKD-EPI Creatinine Equation (2021)    Anion gap 11 5 - 15    Comment: Performed at [facility], [address], [place], [place] [address]  Hemoglobin and hematocrit, blood     Status: Abnormal   Collection Time: 03/27/23  5:24 AM  Result Value Ref Range   Hemoglobin 8.9 (L) 13.0 - 17.0 g/dL   HCT 57.8 (L) 46.9 - 62.9 %    Comment: Performed at [facility], [address] [place], [place] [address]    Studies/Results: US Abdomen Limited RUQ (LIVER/GB)  Result Date: 03/27/2023 CLINICAL DATA:  Elevated LFTs EXAM: ULTRASOUND ABDOMEN LIMITED RIGHT UPPER QUADRANT COMPARISON:  CT abdomen and pelvis with contrast September 21, 2015 FINDINGS: Gallbladder: Cholelithiasis, measuring up to 7 mm. No gallbladder wall thickening or pericholecystic fluid. No sonographic Murphy sign noted by sonographer. Common bile duct: Diameter: 5 mm Liver: No focal lesion identified. Increased hepatic parenchymal echogenicity. Portal vein is patent on color Doppler imaging with normal direction of blood flow towards the liver. Other: None. IMPRESSION: 1. Cholelithiasis without sonographic evidence of acute cholecystitis. 2. Increased hepatic  parenchymal echogenicity, which is nonspecific, but most commonly seen in the setting of hepatic steatosis. Electronically Signed   By: Jacob Moores M.D.   On: 03/27/2023 08:11   DG CHEST PORT 1 VIEW  Result Date: 03/26/2023 CLINICAL DATA:  Cough EXAM: PORTABLE CHEST 1 VIEW COMPARISON:  X-ray 07/19/2019 and older FINDINGS: Film is rotated to the left. Enlarged cardiopericardial silhouette with sternal wires. Calcified aorta. Atrial occlusion clip. Vascular congestion. Small left effusion with some opacity. No pneumothorax. Film is under penetrated. IMPRESSION: Postop chest. Enlarged heart with vascular congestion. Atrial occlusion clip. Small left effusion with adjacent opacity. Infiltrates not excluded. Recommend follow-up. Under penetrated and rotated radiograph Electronically Signed   By: Karen Kays M.D.   On: 03/26/2023 14:17    Medications: I have reviewed the patient's current medications.  Assessment: Black and bloody stools, hemoglobin stable at 8.9  Escherichia coli/Enterobacterales bacteremia, he has been started on IV ceftriaxone 2 g every 24 hours  Severe hypokalemia, potassium 2.6  A-fib with RVR, Eliquis on hold  COPD, chronic hypoxic respiratory failure on 2 L oxygen via nasal cannula  Combined systolic and diastolic CHF  Obstructive sleep apnea, refuses CPAP at nighttime  History of AAA  Elevated alk phos, 289 today, ultrasound showed increased hepatic parenchymal echogenicity, hepatic steatosis, cholelithiasis, 7 mm, normal CBD 5 mm   Plan: Endoscopic procedures, EGD/colonoscopy postponed till tomorrow. I will put patient on clear liquids and encouraged him to finish second gallon of colonic prep.   Patient is being transferred to progressive unit for closer monitoring as he has hypotension, tachycardia, A-fib with RVR and severe hypokalemia. His hospitalist, Dr. Renford Dills will be requesting a cardiology consult/evaluation for further management.   Kerin Salen,  MD 03/27/2023, 10:18 AM

## 2023-03-27 NOTE — Consult Note (Signed)
Cardiology Consultation   Patient ID: Charles Mckinney MRN: [id number]; DOB: Apr 30, 1951  Admit date: 03/24/2023 Date of Consult: 03/27/2023  PCP:  Virl Cagey, MD   [facility] Providers Cardiologist:  Bryan Lemma, MD        Patient Profile:   Charles Mckinney is a 72 y.o. 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 Renford Dills.  History of Present Illness:   Mr. Schmuck presented to the hospital from [facility]F for evaluation of bloody bowel movements, noted over the past week. [name] initial lab workup notable for K 2.2, HBG 7.4, alk phos 409, AST 56, WBC 12.1. Initial BP with EMS was reportedly 82/54. In the ED, patient received 2 units PRBC as well as IV potassium. Blood cultures also obtained, positive for E.coli. Due to hypotension on admission, metoprolol and Eliquis held and GI was consulted. Plan was for colonoscopy today but patient found with severe hypokalemia, K 2.6 as well as increased RVR with afib and procedure was canceled.   On my exam today, patient with limited contribution to HPI. He says that he is not sure when he began noticing bloody stools. In general does not have symptoms of afib but does admit to palpitations and generally not feeling well with current RVR. Patient overall appears ill appearing.    Past Medical History:  Diagnosis Date   AAA (abdominal aortic aneurysm) without rupture 03/14/2019   Anxiety    CAD, multiple vessel 04/2015   Right and Left Heart Catheterization Apr 09, 2015: LM 95%; ostLAD 99% - p-dLAD 80%, D1 99% - D2 80%; OCx 90%,pCx 50%; pRCA 80%, mRCA 50%, rPDA 80% & 90%. --> CABG x 4: LIMA-LAD, SVG-Diag, SeqSVG-rPL-rPDA --> Intra-Op  TEE showed EF of 30% with only mild MR.    Chronic combined systolic and diastolic CHF, NYHA class 3 01/19/2015   Chronic combined systolic and diastolic heart failure, NYHA class 3 01/2014   EF is been ranging from 30-35% since February 2015.  Suggestion of anterior infarct on Myoview/echo.   CKD (chronic kidney disease), stage III 05/29/2017   Coronary artery disease involving native coronary artery of native heart with angina pectoris 02/02/2016   Cough    Essential hypertension 05/30/2017   GERD 11/22/2007   GERD (gastroesophageal reflux disease)    Gout 05/30/2017   Hyperlipidemia    Hyperlipidemia with target LDL less than 70    Hypertension    Hypertensive heart disease    Mild to moderately dilated LV with mild LVH.   Hypothyroidism due to medication    Ischemic cardiomyopathy 01/2014   EF 30-35%.  Severe multivessel CAD noted on cath--CABG x4 -> Myoview (June 2018) shows large anterior-septal-apical infarct with no ischemia.   Kidney stones    Long term current use of anticoagulant therapy 01/19/2015   Morbid obesity    Obesity (BMI 30-39.9)    OSA (obstructive sleep apnea) 10/04/2017   Osteoarthritis    Permanent atrial fibrillation    On Amiodarone & Beta Blocker (on Xarelto)   Persistent atrial fibrillation (HCC): CHA2DS2Vasc 6    Cardioversion 01/19/15 and amiodarone begun  This patients CHA2DS2-VASc Score and unadjusted Ischemic Stroke Rate (% per  year) is equal to 9.7 % stroke rate/year from a score of 6  Above score calculated as 1 point each if present [CHF, HTN, DM, Vascular=MI/PAD/Aortic Plaque, Age if 65-74, or Male] Above score calculated as 2 points each if present [Age > 75, or Stroke/TIA/TE]    PONV (postoperative nausea and vomiting)    Prolonged QT interval 04/26/2017   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   TIA (transient ischemic attack) 04/26/2017    Past Surgical History:  Procedure Laterality Date   CARDIAC CATHETERIZATION N/A 04/09/2015   Procedure: Right/Left Heart Cath  and Coronary Angiography;  Surgeon: Orpah Cobb, MD;  Location: [facility] INVASIVE CV LAB CUPID::  LM 95%; ostLAD 99% - p-dLAD 80%, D1 99% - D2 80%; OCx 90%,pCx 50%; pRCA 80%, mRCA 50%, rPDA 80% & 90%.   CARDIOVERSION N/A 01/19/2015   Procedure: CARDIOVERSION;  Surgeon: Othella Boyer, MD;  Location: [facility] ENDOSCOPY;  Service: Cardiovascular;  Laterality: N/A;  pt in Afib, 12:22 synched cardioversion @  120 joules using Propofol 100 mg,IV....unsuccessful, repeated at 200 joules   CARDIOVERSION N/A 04/02/2015   Procedure: CARDIOVERSION;  Surgeon: Orpah Cobb, MD;  Location: [facility] ENDOSCOPY;  Service: Cardiovascular;  Laterality: N/A;   CATARACT EXTRACTION     CLIPPING OF ATRIAL APPENDAGE  04/13/2015   Procedure: CLIPPING OF ATRIAL APPENDAGE;  Surgeon: Alleen Borne, MD;  Location: [facility] OR;  Service: Open Heart Surgery;;   CORONARY ARTERY BYPASS GRAFT N/A 04/13/2015   Procedure: CORONARY ARTERY BYPASS GRAFTING (CABG);  Surgeon: Alleen Borne, MD;  Location: [facility] OR;  Service: Open Heart Surgery;; LIMA-LAD, SVG-Diag, SeqSVG-rPL-rPDA    NM MYOVIEW LTD  05/2017   EF 27%.  No reversible ischemia.  Large anterior-apical and septal infarct.  Diffuse LV HK and moderate LV dilation.   TEE WITHOUT CARDIOVERSION N/A 04/13/2015   Procedure: TRANSESOPHAGEAL ECHOCARDIOGRAM (TEE);  Surgeon: Alleen Borne, MD;  Location: [facility] OR;  Service: Open Heart Surgery;  Laterality: N/A;   TRANSTHORACIC ECHOCARDIOGRAM  2/'15, 2/'16, 4/'16   a) EF 30-35%.  Mild LVH.  GRII DD.  Mild LA dilation.  Moderate diastolic dysfunction. b) Technically difficult.  Mild LVH.  EF 35%.  Diffuse HK.  Moderate LA dilation.  Mildly dilated RV with mildly reduced function.  c)   Mild concentric LVH.  EF 30-35%, diffuse HK.  Severe HK of mid apical inferior wall.  Moderate LA dilation.   TRANSTHORACIC ECHOCARDIOGRAM N/A 1/18, 5/18, 7/18   a) Moderate severely reduced LV.  EF 30 of 35% with diffuse HK.  Mild MR.  Mildly dilated RA.;; b) (In setting of TIA).   Technically difficult mild to moderate globally reduced LV function.  Mild LVH.  Mild MR.  EF estimated 40-45%.  Moderate LA dilation.;; c)  Afib. Mild to mod dilated LV with mild LVH. EF 30-35% with diffuse HK. Mildly dilated RV with mildly decrease Systolic Fxn. Mild MR     Home Medications:  Prior to Admission medications   Medication Sig Start Date End Date Taking? Authorizing Provider  acetaminophen (TYLENOL) 325 MG tablet Take 650 mg by mouth every 6 (six) hours as needed (for pain).   Yes [provider]  acetaminophen (TYLENOL) 500 MG tablet Take 1,000 mg by mouth in the morning and at bedtime.   Yes [provider]  albuterol (PROAIR HFA) 108 (90 Base) MCG/ACT inhaler INHALE 1 PUFF INTO THE LUNGS EVERY 6 HOURS AS NEEDED FOR WHEEZING OR SHORTNESS OF BREATH Patient taking differently: Inhale 1 puff  into the lungs every 6 (six) hours as needed for wheezing or shortness of breath. 10/31/17  Yes [name], MD  albuterol (PROVENTIL) (2.5 MG/3ML) 0.083% nebulizer solution USE ONE VIAL USING NEBULIZER EVERY 6 (SIX) HOURS AS NEEDED FOR WHEEZING OR SHORTNESS OF BREATH. Patient taking differently: Take 2.5 mg by nebulization every 6 (six) hours as needed for wheezing or shortness of breath. 06/19/18  Yes [name], MD  allopurinol (ZYLOPRIM) 300 MG tablet Take 300 mg by mouth in the morning.   Yes [provider]  apixaban (ELIQUIS) 5 MG TABS tablet Take 1 tablet (5 mg total) by mouth 2 (two) times daily. 07/25/19  Yes Melene Plan, MD  ASPERCREME LIDOCAINE 4 % Place 1 patch onto the skin See admin instructions. Apply 1 patch to the right and left hips once a day (12 hours on/12 hours off)   Yes [provider]  ASPERCREME ORIGINAL 10 % cream Apply 1 Application topically every 4 (four) hours as needed for muscle pain (LEFT TOES).   Yes [provider]  Benzocaine-Menthol (CEPACOL MT) 1 lozenge See admin instructions. Dissove 1 lozenge  in the mouth every four hours as needed for soreness   Yes [provider]  bumetanide (BUMEX) 2 MG tablet Take 2-4 mg by mouth See admin instructions. Take 4 mg by mouth at 5 PM daily and 2 mg once a day as needed for an overnight weight gain of 3 pounds or 5 pounds in one week   Yes [provider]  Carboxymethylcellulose Sodium (ARTIFICIAL TEARS OP) Place 1 drop into both eyes every 6 (six) hours as needed (for irritation).   Yes [provider]  Cholecalciferol (VITAMIN D3) 125 MCG (5000 UT) CAPS Take 5,000 Units by mouth daily.   Yes [provider]  DESITIN 40 % PSTE Apply 1 Application topically See admin instructions. Apply to buttocks 2 times a day   Yes [provider]  ferrous sulfate 325 (65 FE) MG tablet Take 325 mg by mouth 2 (two) times daily with a meal.   Yes [provider]  fluticasone (FLONASE) 50 MCG/ACT nasal spray Place 1 spray into both nostrils daily.   Yes [provider]  gabapentin (NEURONTIN) 300 MG capsule Take 300 mg by mouth in the morning and at bedtime.   Yes [provider]  hydrocortisone 1 % lotion Apply 1 Application topically 3 (three) times daily as needed for itching (LOWER EXTREMETIES).   Yes [provider]  [employer] [facility]) OINT Apply 1 application  topically See admin instructions. Apply to buttocks 2 times a day   Yes [provider]  Lactobacillus (ACIDOPHILUS) 100 MG CAPS Take 100 mg by mouth daily. 03/15/23 04/14/23 Yes [provider]  levothyroxine (SYNTHROID) 50 MCG tablet Take 50 mcg by mouth daily before breakfast.   Yes [provider]  loperamide (IMODIUM A-D) 2 MG tablet Take 2 mg by mouth every 8 (eight) hours as needed (for loose stools).   Yes [provider]  Menthol, Topical Analgesic, (BIOFREEZE) 4 % GEL Apply 1 application  topically at bedtime as needed (for lower back pain).   Yes [provider]   metolazone (ZAROXOLYN) 2.5 MG tablet Take 2.5 mg by mouth every Monday, Wednesday, and Friday.   Yes [provider]  metoprolol succinate (TOPROL-XL) 25 MG 24 hr tablet Take 12.5 mg by mouth in the morning and at bedtime. 08/21/19  Yes [provider]  nitroGLYCERIN (  NITROSTAT) 0.4 MG SL tablet Place 0.4 mg under the tongue every 5 (five) minutes as needed for chest pain.   Yes [provider]  OXYGEN Inhale 2 L/min into the lungs See admin instructions. 2 L/min to MAINTAIN SATS ABOVE 90% AND as needed for shortness of breath at bedtime   Yes [provider]  pantoprazole (PROTONIX) 40 MG tablet Take 40 mg by mouth daily before breakfast. 03/16/23 04/27/23 Yes [provider]  potassium chloride SA (K-DUR) 20 MEQ tablet Take 40 mEq by mouth daily.   Yes [provider]  pravastatin (PRAVACHOL) 40 MG tablet TAKE ONE TABLET BY MOUTH ONCE DAILY Patient taking differently: Take 40 mg by mouth at bedtime. 01/16/19  Yes Marykay Lex, MD  Sertraline HCl 200 MG CAPS Take 200 mg by mouth in the morning.   Yes [provider]  Skin Protectants, Misc. (MINERIN CREME) CREA Apply 1 Application topically See admin instructions. Apply to both lower legs once a day   Yes [provider]  SPIRIVA HANDIHALER 18 MCG inhalation capsule Place 36 mcg into inhaler and inhale daily.   Yes [provider]  sucralfate (CARAFATE) 1 g tablet Take 1 g by mouth 4 (four) times daily.   Yes [provider]  traMADol (ULTRAM) 50 MG tablet Take 50 mg by mouth in the morning and at bedtime.   Yes [provider]  traZODone (DESYREL) 50 MG tablet Take 50 mg by mouth at bedtime.   Yes [provider]  Monte Fantasia INHUB 250-50 MCG/ACT AEPB Inhale 1 puff into the lungs See admin instructions. Inhale 1 puff into the lungs every twelve hours- rinse and spit after each use   Yes [provider]  ZOFRAN 4 MG tablet Take 4 mg by  mouth every 6 (six) hours as needed for nausea.   Yes [provider]    Inpatient Medications: Scheduled Meds:  amiodarone  150 mg Intravenous Once   Chlorhexidine Gluconate Cloth  6 each Topical Daily   levothyroxine  100 mcg Oral Q0600   metoprolol tartrate  12.5 mg Oral BID   mometasone-formoterol  2 puff Inhalation BID   pantoprazole (PROTONIX) IV  40 mg Intravenous Q12H   sodium chloride flush  3 mL Intravenous Q12H   Continuous Infusions:  amiodarone 60 mg/hr (03/27/23 1626)   Followed by   amiodarone     cefTRIAXone (ROCEPHIN)  IV Stopped (03/27/23 1059)   PRN Meds: acetaminophen **OR** acetaminophen, albuterol, ondansetron (ZOFRAN) IV, mouth rinse  Allergies:    Allergies  Allergen Reactions   Niacin And Related Other (See Comments)    FLUSHING   Xarelto [Rivaroxaban] Other (See Comments)    Cause bloody stools. Switched to eliquis low dose due to bleeding.    Penicillins Itching, Rash and Other (See Comments)    Has patient had a PCN reaction causing immediate rash, facial/tongue/throat swelling, SOB or lightheadedness with hypotension: Yes Has patient had a PCN reaction causing severe rash involving mucus membranes or skin necrosis: No Has patient had a PCN reaction that required hospitalization: No Has patient had a PCN reaction occurring within the last 10 years: No If all of the above answers are "NO", then may proceed with Cephalosporin use.     Social History:   Social History   Socioeconomic History   Marital status: Divorced    Spouse name: Not on file   Number of children: Not on file   Years of education: Not on file  Highest education level: Not on file  Occupational History   Occupation: disabled  Tobacco Use   Smoking status: Former    Types: Cigarettes    Quit date: 2000    Years since quitting: 24.3   Smokeless tobacco: Never  Vaping Use   Vaping Use: Never used  Substance and Sexual Activity   Alcohol use: No   Drug use:  No   Sexual activity: Yes  Other Topics Concern   Not on file  Social History Narrative   Not on file   Social Determinants of Health   Financial Resource Strain: Not on file  Food Insecurity: No Food Insecurity (03/25/2023)   Hunger Vital Sign    Worried About Running Out of Food in the Last Year: Never true    Ran Out of Food in the Last Year: Never true  Transportation Needs: No Transportation Needs (03/25/2023)   PRAPARE - Administrator, Civil Service (Medical): No    Lack of Transportation (Non-Medical): No  Physical Activity: Not on file  Stress: Not on file  Social Connections: Not on file  Intimate Partner Violence: Not At Risk (03/25/2023)   Humiliation, Afraid, Rape, and Kick questionnaire    Fear of Current or Ex-Partner: No    Emotionally Abused: No    Physically Abused: No    Sexually Abused: No    Family History:    Family History  Problem Relation Age of Onset   Diabetes Mother    Heart disease Father    Congestive Heart Failure Sister    Stroke Brother    Heart disease Brother    Diabetes Brother      ROS:  Please see the history of present illness.   All other ROS reviewed and negative.     Physical Exam/Data:   Vitals:   03/27/23 1526 03/27/23 1531 03/27/23 1600 03/27/23 1619  BP:  (!) 136/92 (!) 100/58 119/81  Pulse: (!) 121 64 60 95  Resp:  (!) 24 (!) 23 (!) 22  Temp:      TempSrc:      SpO2: 99% 99% 98% 98%  Weight:      Height:   (1.778 m)      Intake/Output Summary (Last 24 hours) at 03/27/2023 1632 Last data filed at 03/27/2023 1627 Gross per 24 hour  Intake 1526.13 ml  Output 3000 ml  Net -1473.87 ml      03/27/2023    4:49 AM 03/26/2023    5:00 AM 03/25/2023    3:15 PM  Last 3 Weights  Weight (lbs) 281 lb 12 oz 289 lb 3.9 oz 285 lb 4.4 oz  Weight (kg) 127.8 kg 131.2 kg 129.4 kg     Body mass index is 40.43 kg/m.  General:  ill/frail appearing HEENT: normal Neck: no JVD Vascular: No carotid bruits;  Distal pulses 2+ bilaterally Cardiac:  normal S1, S2; irregularly irregular and rapid Lungs: exam limited due to patient weakness/poor inspiratory effort. Lower lobes diminished. No rales/rhonchi Abd: soft, nontender, no hepatomegaly  Ext: non-pitting edema in bilateral lower extremities. Notable erythema bilaterally to mid-calf. Musculoskeletal:  No deformities, BUE and BLE strength normal and equal Skin: warm and dry  Neuro:  CNs 2-12 intact, no focal abnormalities noted Psych:  Normal affect   EKG:  The EKG was personally reviewed and demonstrates:  afib with RVR, RBBB morphology Telemetry:  Telemetry was personally reviewed and demonstrates:  afib with RVR, ventricular rates increased today, as high as  140s BPM.   Relevant CV Studies:  10/18/18 TTE  Study Conclusions   - Procedure narrative: Technically difficult study with poor echo    windows. Definity contrast not given.  - Left ventricle: The cavity size was normal. Wall thickness was    increased in a pattern of mild LVH. Systolic function was    moderately to severely reduced. The estimated ejection fraction    was in the range of 30% to 35%. Diffuse hypokinesis. The study is    not technically sufficient to allow evaluation of LV diastolic    function.  - Mitral valve: Mildly thickened leaflets . There was mild    regurgitation.  - Left atrium: The atrium was normal in size.  - Atrial septum: No defect or patent foramen ovale was identified.  - Inferior vena cava: The vessel was normal in size. The    respirophasic diameter changes were in the normal range (>= 50%),    consistent with normal central venous pressure.   Impressions:   - Technically difficult study. Compared to a prior study in 2018,    the LVEF is unchanged at 30-35% wtih global hypokinesis.   Laboratory Data:  High Sensitivity Troponin:  No results for input(s): "TROPONINIHS" in the last 720 hours.   Chemistry Recent Labs  Lab 03/25/23 0600  03/25/23 0859 03/25/23 0950 03/25/23 1534 03/26/23 0731 03/27/23 0523 03/27/23 0524  NA 123*  --   --  133* 135  --  137  K 2.6*  --    < > 2.6* 3.1*  --  2.6*  CL 83*  --   --  92* 100  --  100  CO2 26  --   --  29 26  --  26  GLUCOSE 406*  --   --  113* 121*  --  128*  BUN 49*  --   --  51* 42*  --  35*  CREATININE 1.69*  --   --  1.54* 1.37*  --  1.11  CALCIUM 7.6*  --   --  8.0* 7.9*  --  8.0*  MG 9.7* 2.4  --   --   --  2.3  --   GFRNONAA 43*  --   --  48* 55*  --  >60  ANIONGAP 14  --   --  12 9  --  11   < > = values in this interval not displayed.    Recent Labs  Lab 03/25/23 0600 03/26/23 0731 03/27/23 0524  PROT 5.7* 5.3* 5.8*  ALBUMIN 2.1* 1.7* 2.0*  AST 35 29 23  ALT ALKPHOS 366* 334* 289*  BILITOT 0.7 0.7 0.6   Lipids No results for input(s): "CHOL", "TRIG", "HDL", "LABVLDL", "LDLCALC", "CHOLHDL" in the last 168 hours.  Hematology Recent Labs  Lab 03/24/23 2255 03/25/23 0600 03/26/23 0731 03/26/23 1652 03/27/23 0524  WBC 12.1*  --  11.8*  --   --   RBC 2.97*  --  3.40*  --   --   HGB 7.4*   < > 8.5* 8.7* 8.9*  HCT 25.0*   < > 29.1* 29.9* 30.1*  MCV 84.2  --  85.6  --   --   MCH 24.9*  --  25.0*  --   --   MCHC 29.6*  --  29.2*  --   --   RDW 16.2*  --  16.3*  --   --   PLT 379  --  388  --   --    < > = values in this interval not displayed.   Thyroid No results for input(s): "TSH", "FREET4" in the last 168 hours.  BNPNo results for input(s): "BNP", "PROBNP" in the last 168 hours.  DDimer No results for input(s): "DDIMER" in the last 168 hours.   Radiology/Studies:  US Abdomen Limited RUQ (LIVER/GB)  Result Date: 03/27/2023 CLINICAL DATA:  Elevated LFTs EXAM: ULTRASOUND ABDOMEN LIMITED RIGHT UPPER QUADRANT COMPARISON:  CT abdomen and pelvis with contrast September 21, 2015 FINDINGS: Gallbladder: Cholelithiasis, measuring up to 7 mm. No gallbladder wall thickening or pericholecystic fluid. No sonographic Murphy sign noted by sonographer.  Common bile duct: Diameter: 5 mm Liver: No focal lesion identified. Increased hepatic parenchymal echogenicity. Portal vein is patent on color Doppler imaging with normal direction of blood flow towards the liver. Other: None. IMPRESSION: 1. Cholelithiasis without sonographic evidence of acute cholecystitis. 2. Increased hepatic parenchymal echogenicity, which is nonspecific, but most commonly seen in the setting of hepatic steatosis. Electronically Signed   By: Jacob Moores M.D.   On: 03/27/2023 08:11   DG CHEST PORT 1 VIEW  Result Date: 03/26/2023 CLINICAL DATA:  Cough EXAM: PORTABLE CHEST 1 VIEW COMPARISON:  X-ray 07/19/2019 and older FINDINGS: Film is rotated to the left. Enlarged cardiopericardial silhouette with sternal wires. Calcified aorta. Atrial occlusion clip. Vascular congestion. Small left effusion with some opacity. No pneumothorax. Film is under penetrated. IMPRESSION: Postop chest. Enlarged heart with vascular congestion. Atrial occlusion clip. Small left effusion with adjacent opacity. Infiltrates not excluded. Recommend follow-up. Under penetrated and rotated radiograph Electronically Signed   By: Karen Kays M.D.   On: 03/26/2023 14:17     Assessment and Plan:   Chronic atrial fibrillation, acute RVR Secondary hypercoagulable state  Patient with hx longstanding afib. Previously was on amiodarone, metoprolol, Eliquis. At some point since being seen by [facility] in 2020, Amiodarone was discontinued. Patient now admitted with acute GI bleed, E.coli bacteremia and having RVR.  Patient is acutely ill, very much in need of having a colonoscopy but had his case cancelled today due to RVR. Given low BP, no ability to increase metoprolol and Digoxin contraindicated with CKD. Will reload Amiodarone for rate control to facilitate colonoscopy. Patient unlikely to convert given longstanding afib, and risk of poor rate control outweighs possible stroke risk at this time.  Continue holding  Eliquis with acute bleed and anemia Continue low dose Metoprolol 12.5mg  BID.  Chronic HFrEF  Last echo in 2019 showed LVEF 30-35%. Patient home meds list Bumex 4mg  daily with PRN 2mg  extra for weight gain in addition to Monday/Wednesday/Friday Metolazone. This is quite different from patient's regimen at his [facility] with [facility]. Per patient, cardiac medication management has been by facility medical staff. CXR underpenetrated but does show enlarged heart with vascular congestion, small left effusion.   Not a great candidate for Entresto/ARB/ACEi, MRA given CKD and acute hypotension Continue holding diuretics with low K/low BP  CAD  Hypercholesterolemia  Patient without anginal symptoms per limited HPI. Continue secondary prevention.  Per primary team: GI bleed E.coli bacteremia OSA COPD Elevated alk phos Deconditioning   Risk Assessment/Risk Scores:        New York Heart Association (NYHA) Functional Class NYHA Class III (difficult to determine with poor baseline condition/inability to ambulate)   CHA2DS2-VASc Score = 4   This indicates a 4.8% annual risk of stroke. The patient's score is based upon: CHF History: 1 HTN History: 1  Diabetes History: 0 Stroke History: 0 Vascular Disease History: 1 Age Score: 1 Gender Score: 0         For questions or updates, please contact [facility] Please consult [email or web address] for contact info under    Signed, Perlie Gold, PA-C  03/27/2023 4:32 PM

## 2023-03-27 NOTE — TOC Initial Note (Signed)
Transition of Care [facility]) - Initial/Assessment Note    Patient Details  Name: Charles Mckinney MRN: [id number] Date of Birth: [birth year]  Transition of Care [facility]) CM/SW Contact:    Lanier Clam, RN Phone Number: 03/27/2023, 3:02 PM  Clinical Narrative: From [facility] to return.No PT cons needed. Await medical stability.                  Expected Discharge Plan: Long Term Nursing Home Barriers to Discharge: Continued Medical Work up   Patient Goals and CMS Choice            Expected Discharge Plan and Services                                              Prior Living Arrangements/Services                       Activities of Daily Living Home Assistive Devices/Equipment: Hospital bed ADL Screening (condition at time of admission) [name] cognitive ability adequate to safely complete daily activities?: Yes Is the patient deaf or have difficulty hearing?: No Does the patient have difficulty seeing, even when wearing glasses/contacts?: No Does the patient have difficulty concentrating, remembering, or making decisions?: No Patient able to express need for assistance with ADLs?: Yes Does the patient have difficulty dressing or bathing?: Yes Independently performs ADLs?: No Communication: Independent Dressing (OT): Needs assistance Is this a change from baseline?: Pre-admission baseline Grooming: Needs assistance Is this a change from baseline?: Pre-admission baseline Feeding: Independent Bathing: Needs assistance Is this a change from baseline?: Pre-admission baseline Toileting: Needs assistance Is this a change from baseline?: Pre-admission baseline In/Out Bed: Needs assistance Is this a change from baseline?: Pre-admission baseline Walks in Home: Needs assistance Is this a change from baseline?: Pre-admission baseline Does the patient have difficulty walking or climbing stairs?: Yes Weakness of Legs: Both Weakness of Arms/Hands:  Both  Permission Sought/Granted                  Emotional Assessment              Admission diagnosis:  Hypokalemia [E87.6] GI bleeding [K92.2] Acute GI bleeding [K92.2] Symptomatic anemia [D64.9] Patient Active Problem List   Diagnosis Date Noted   GI bleeding 03/25/2023   Symptomatic anemia 03/25/2023   Acute renal failure superimposed on stage 3a chronic kidney disease 03/25/2023   Elevated alkaline phosphatase level 03/25/2023   Hypothyroidism due to medication    AAA (abdominal aortic aneurysm) without rupture 03/14/2019   Ischemic cardiomyopathy 02/01/2018   OSA (obstructive sleep apnea) 10/04/2017   Essential hypertension 05/30/2017   Gout 05/30/2017   CKD (chronic kidney disease), stage III (HCC) 05/29/2017   Hypokalemia 04/26/2017   Acute on chronic congestive heart failure 12/13/2016   Coronary artery disease involving native coronary artery of native heart with angina pectoris 02/02/2016   Acute on chronic combined systolic and diastolic CHF (congestive heart failure) 01/19/2015   Chronic combined systolic and diastolic CHF, NYHA class 3 01/19/2015   Long term current use of anticoagulant therapy 01/19/2015   Persistent atrial fibrillation (HCC): CHA2DS2Vasc 6    GERD 11/22/2007   Hyperlipidemia with target LDL less than 70    Morbid obesity (HCC)    PCP:  Virl Cagey, MD Pharmacy:   [facility] - [place], [place] -  [employer] [address] [place] [address] Phone: [phone] Fax: [phone]     Social Determinants of Health (SDOH) Social History: SDOH Screenings   Food Insecurity: No Food Insecurity (03/25/2023)  Housing: Low Risk  (03/25/2023)  Transportation Needs: No Transportation Needs (03/25/2023)  Utilities: Not At Risk (03/25/2023)  Tobacco Use: Medium Risk (03/25/2023)   SDOH Interventions:     Readmission Risk Interventions     No data to display

## 2023-03-27 NOTE — [place] FL2 (Signed)
[place] MEDICAID FL2 LEVEL OF CARE FORM     IDENTIFICATION  Patient Name: Charles Mckinney Birthdate: [birth year] Sex: male Admission Date (Current Location): 03/24/2023  [place] and [place] Number:  Haynes Bast [id number] Facility and Address:  [facility],  [address], [place] [address]      Provider Number: [id number]  Attending Physician Name and Address:  Burnadette Pop, MD  Relative Name and Phone Number:  Minerva Areola Kailin(son)[phone]    Current Level of Care: Hospital Recommended Level of Care: Nursing Facility Prior Approval Number:    Date Approved/Denied:   PASRR Number: [id number]  Discharge Plan: Other (Comment) (LTC)    Current Diagnoses: Patient Active Problem List   Diagnosis Date Noted   GI bleeding 03/25/2023   Symptomatic anemia 03/25/2023   Acute renal failure superimposed on stage 3a chronic kidney disease 03/25/2023   Elevated alkaline phosphatase level 03/25/2023   Hypothyroidism due to medication    AAA (abdominal aortic aneurysm) without rupture 03/14/2019   Ischemic cardiomyopathy 02/01/2018   OSA (obstructive sleep apnea) 10/04/2017   Essential hypertension 05/30/2017   Gout 05/30/2017   CKD (chronic kidney disease), stage III (HCC) 05/29/2017   Hypokalemia 04/26/2017   Acute on chronic congestive heart failure 12/13/2016   Coronary artery disease involving native coronary artery of native heart with angina pectoris 02/02/2016   Acute on chronic combined systolic and diastolic CHF (congestive heart failure) 01/19/2015   Chronic combined systolic and diastolic CHF, NYHA class 3 01/19/2015   Long term current use of anticoagulant therapy 01/19/2015   Persistent atrial fibrillation (HCC): CHA2DS2Vasc 6    GERD 11/22/2007   Hyperlipidemia with target LDL less than 70    Morbid obesity (HCC)     Orientation RESPIRATION BLADDER Height & Weight     Self, Time, Situation, Place  O2 External catheter Weight: 127.8  kg Height:   (177.8 cm)  BEHAVIORAL SYMPTOMS/MOOD NEUROLOGICAL BOWEL NUTRITION STATUS      Continent Diet (clear,thin)  AMBULATORY STATUS COMMUNICATION OF NEEDS Skin   Extensive Assist   PU Stage and Appropriate Care (R buttock-wound care see d/c summary)                       Personal Care Assistance Level of Assistance  Bathing, Feeding, Dressing Bathing Assistance: Limited assistance Feeding assistance: Limited assistance Dressing Assistance: Maximum assistance     Functional Limitations Info  Sight, Hearing, Speech Sight Info: Impaired Hearing Info: Impaired Speech Info: Impaired    SPECIAL CARE FACTORS FREQUENCY  PT (By licensed PT), OT (By licensed OT)     PT Frequency: 2x week OT Frequency: 2x week            Contractures Contractures Info: Not present    Additional Factors Info  Code Status, Allergies Code Status Info: Full Allergies Info: Niacin And Related, Xarelto (Rivaroxaban), Penicillins           Current Medications (03/27/2023):  This is the current hospital active medication list Current Facility-Administered Medications  Medication Dose Route Frequency Provider Last Rate Last Admin   acetaminophen (TYLENOL) tablet 650 mg  650 mg Oral Q6H PRN Opyd, Lavone Neri, MD   650 mg at 03/26/23 2230   Or   acetaminophen (TYLENOL) suppository 650 mg  650 mg Rectal Q6H PRN Opyd, Lavone Neri, MD       albuterol (PROVENTIL) (2.5 MG/3ML) 0.083% nebulizer solution 3 mL  3 mL Inhalation Q4H PRN Opyd, Lavone Neri,  MD       amiodarone (NEXTERONE) 1.8 mg/mL load via infusion 150 mg  150 mg Intravenous Once Perlie Gold, PA-C       Followed by   amiodarone (NEXTERONE PREMIX) 360-4.14 MG/200ML-% (1.8 mg/mL) IV infusion  60 mg/hr Intravenous Continuous Perlie Gold, PA-C       Followed by   amiodarone (NEXTERONE PREMIX) 360-4.14 MG/200ML-% (1.8 mg/mL) IV infusion  30 mg/hr Intravenous Continuous Perlie Gold, PA-C       cefTRIAXone (ROCEPHIN) 2 g in sodium  chloride 0.9 % 100 mL IVPB  2 g Intravenous Q24H Adhikari, Amrit, MD 200 mL/hr at 03/27/23 1026 2 g at 03/27/23 1026   Chlorhexidine Gluconate Cloth 2 % PADS 6 each  6 each Topical Daily Adhikari, Willia Craze, MD       levothyroxine (SYNTHROID) tablet 100 mcg  100 mcg Oral Q0600 Briscoe Deutscher, MD   100 mcg at 03/26/23 0624   metoprolol tartrate (LOPRESSOR) tablet 12.5 mg  12.5 mg Oral BID Burnadette Pop, MD   12.5 mg at 03/27/23 0940   mometasone-formoterol (DULERA) 200-5 MCG/ACT inhaler 2 puff  2 puff Inhalation BID Opyd, Lavone Neri, MD   2 puff at 03/27/23 0750   ondansetron (ZOFRAN) injection 4 mg  4 mg Intravenous Q6H PRN Burnadette Pop, MD   4 mg at 03/26/23 1720   Oral care mouth rinse  15 mL Mouth Rinse PRN Burnadette Pop, MD       pantoprazole (PROTONIX) injection 40 mg  40 mg Intravenous Q12H Kerin Salen, MD   40 mg at 03/27/23 1021   sodium chloride flush (NS) 0.9 % injection 3 mL  3 mL Intravenous Q12H Opyd, Lavone Neri, MD   3 mL at 03/27/23 1017     Discharge Medications: Please see discharge summary for a list of discharge medications.  Relevant Imaging Results:  Relevant Lab Results:   Additional Information ss#[id number]  Lanier Clam, RN

## 2023-03-28 ENCOUNTER — Encounter (HOSPITAL_COMMUNITY): Payer: Self-pay | Admitting: Family Medicine

## 2023-03-28 ENCOUNTER — Encounter (HOSPITAL_COMMUNITY)
Admission: EM | Disposition: A | Discharge status: Hospice | Payer: Self-pay | Source: Skilled Nursing Facility | Attending: Internal Medicine

## 2023-03-28 ENCOUNTER — Ambulatory Visit: Payer: Medicare Other | Admitting: Internal Medicine

## 2023-03-28 ENCOUNTER — Inpatient Hospital Stay (HOSPITAL_COMMUNITY): Payer: No Typology Code available for payment source | Admitting: Anesthesiology

## 2023-03-28 DIAGNOSIS — Z515 Encounter for palliative care: Secondary | ICD-10-CM

## 2023-03-28 DIAGNOSIS — R7881 Bacteremia: Secondary | ICD-10-CM

## 2023-03-28 DIAGNOSIS — K922 Gastrointestinal hemorrhage, unspecified: Secondary | ICD-10-CM | POA: Diagnosis not present

## 2023-03-28 DIAGNOSIS — I959 Hypotension, unspecified: Secondary | ICD-10-CM

## 2023-03-28 DIAGNOSIS — K259 Gastric ulcer, unspecified as acute or chronic, without hemorrhage or perforation: Secondary | ICD-10-CM

## 2023-03-28 DIAGNOSIS — E039 Hypothyroidism, unspecified: Secondary | ICD-10-CM

## 2023-03-28 DIAGNOSIS — Z7189 Other specified counseling: Secondary | ICD-10-CM

## 2023-03-28 DIAGNOSIS — N179 Acute kidney failure, unspecified: Secondary | ICD-10-CM | POA: Diagnosis not present

## 2023-03-28 DIAGNOSIS — D126 Benign neoplasm of colon, unspecified: Secondary | ICD-10-CM

## 2023-03-28 DIAGNOSIS — D638 Anemia in other chronic diseases classified elsewhere: Secondary | ICD-10-CM

## 2023-03-28 DIAGNOSIS — R410 Disorientation, unspecified: Secondary | ICD-10-CM

## 2023-03-28 DIAGNOSIS — R4589 Other symptoms and signs involving emotional state: Secondary | ICD-10-CM

## 2023-03-28 HISTORY — PX: BIOPSY: SHX5522

## 2023-03-28 HISTORY — PX: POLYPECTOMY: SHX5525

## 2023-03-28 HISTORY — PX: COLONOSCOPY WITH PROPOFOL: SHX5780

## 2023-03-28 HISTORY — PX: SUBMUCOSAL TATTOO INJECTION: SHX6856

## 2023-03-28 HISTORY — PX: ESOPHAGOGASTRODUODENOSCOPY (EGD) WITH PROPOFOL: SHX5813

## 2023-03-28 LAB — URINE CULTURE: Culture: >=100000 — AB

## 2023-03-28 LAB — CBC
HCT: 32.2 % — ABNORMAL LOW (ref 39.0–52.0)
Hemoglobin: 9.1 g/dL — ABNORMAL LOW (ref 13.0–17.0)
MCH: 25.4 pg — ABNORMAL LOW (ref 26.0–34.0)
MCHC: 28.3 g/dL — ABNORMAL LOW (ref 30.0–36.0)
MCV: 89.9 fL (ref 80.0–100.0)
Platelets: 467 10*3/uL — ABNORMAL HIGH (ref 150–400)
RBC: 3.58 MIL/uL — ABNORMAL LOW (ref 4.22–5.81)
RDW: 16.8 % — ABNORMAL HIGH (ref 11.5–15.5)
WBC: 10.4 10*3/uL (ref 4.0–10.5)
nRBC: 0 % (ref 0.0–0.2)

## 2023-03-28 LAB — COMPREHENSIVE METABOLIC PANEL
ALT: 22 U/L (ref 0–44)
AST: 22 U/L (ref 15–41)
Albumin: 2.2 g/dL — ABNORMAL LOW (ref 3.5–5.0)
Alkaline Phosphatase: 260 U/L — ABNORMAL HIGH (ref 38–126)
Anion gap: 13 (ref 5–15)
BUN: 26 mg/dL — ABNORMAL HIGH (ref 8–23)
CO2: 25 mmol/L (ref 22–32)
Calcium: 8.2 mg/dL — ABNORMAL LOW (ref 8.9–10.3)
Chloride: 98 mmol/L (ref 98–111)
Creatinine, Ser: 1.24 mg/dL (ref 0.61–1.24)
GFR, Estimated: >60 mL/min (ref >60)
Glucose, Bld: 112 mg/dL — ABNORMAL HIGH (ref 70–99)
Potassium: 3.2 mmol/L — ABNORMAL LOW (ref 3.5–5.1)
Sodium: 136 mmol/L (ref 135–145)
Total Bilirubin: 0.6 mg/dL (ref 0.3–1.2)
Total Protein: 5.7 g/dL — ABNORMAL LOW (ref 6.5–8.1)

## 2023-03-28 LAB — CULTURE, BLOOD (ROUTINE X 2)
Culture: NO GROWTH
Special Requests: ADEQUATE

## 2023-03-28 SURGERY — ESOPHAGOGASTRODUODENOSCOPY (EGD) WITH PROPOFOL
Anesthesia: Monitor Anesthesia Care

## 2023-03-28 MED ORDER — LACTATED RINGERS IV SOLN: INTRAVENOUS | Status: DC

## 2023-03-28 MED ORDER — POTASSIUM CHLORIDE 10 MEQ/100ML IV SOLN
10.0000 meq | INTRAVENOUS | Status: AC
  Administered 2023-03-28 (×5): 10 meq via INTRAVENOUS
  Filled 2023-03-28 (×5): qty 100

## 2023-03-28 MED ORDER — HYDROMORPHONE HCL 1 MG/ML IJ SOLN
0.5000 mg | INTRAMUSCULAR | Status: DC | PRN
  Administered 2023-03-28 – 2023-04-01 (×7): 0.5 mg via INTRAVENOUS
  Filled 2023-03-28: qty 1
  Filled 2023-03-28: qty 0.5
  Filled 2023-03-28 (×5): qty 1

## 2023-03-28 MED ORDER — LACTATED RINGERS IV SOLN: INTRAVENOUS | Status: DC | PRN

## 2023-03-28 MED ORDER — SPOT INK MARKER SYRINGE KIT
PACK | SUBMUCOSAL | Status: DC | PRN
  Administered 2023-03-28: 3 mL via SUBMUCOSAL

## 2023-03-28 MED ORDER — PROPOFOL 500 MG/50ML IV EMUL
INTRAVENOUS | Status: DC | PRN
  Administered 2023-03-28: 75 ug/kg/min via INTRAVENOUS

## 2023-03-28 MED ORDER — LIDOCAINE HCL (CARDIAC) PF 100 MG/5ML IV SOSY
PREFILLED_SYRINGE | INTRAVENOUS | Status: DC | PRN
  Administered 2023-03-28: 50 mg via INTRAVENOUS

## 2023-03-28 MED ORDER — OXYCODONE HCL 5 MG PO TABS
5.0000 mg | ORAL_TABLET | Freq: Four times a day (QID) | ORAL | Status: DC | PRN
  Administered 2023-03-28 – 2023-04-01 (×10): 5 mg via ORAL
  Filled 2023-03-28 (×10): qty 1

## 2023-03-28 MED ORDER — PROPOFOL 1000 MG/100ML IV EMUL
INTRAVENOUS | Status: AC
  Filled 2023-03-28: qty 100

## 2023-03-28 MED ORDER — PHENYLEPHRINE HCL-NACL 20-0.9 MG/250ML-% IV SOLN
INTRAVENOUS | Status: DC | PRN
  Administered 2023-03-28: 55 ug/min via INTRAVENOUS

## 2023-03-28 MED ORDER — ONDANSETRON HCL 4 MG/2ML IJ SOLN
INTRAMUSCULAR | Status: DC | PRN
  Administered 2023-03-28: 4 mg via INTRAVENOUS

## 2023-03-28 SURGICAL SUPPLY — 25 items
BLOCK BITE 60FR ADLT L/F BLUE (MISCELLANEOUS) ×2 IMPLANT
ELECT REM PT RETURN 9FT ADLT (ELECTROSURGICAL)
ELECTRODE REM PT RTRN 9FT ADLT (ELECTROSURGICAL) IMPLANT
FCP BXJMBJMB 240X2.8X (CUTTING FORCEPS)
FLOOR PAD 36X40 (MISCELLANEOUS) ×2
FORCEP RJ3 GP 1.8X160 W-NEEDLE (CUTTING FORCEPS) IMPLANT
FORCEPS BIOP RAD 4 LRG CAP 4 (CUTTING FORCEPS) IMPLANT
FORCEPS BIOP RJ4 240 W/NDL (CUTTING FORCEPS)
FORCEPS BXJMBJMB 240X2.8X (CUTTING FORCEPS) IMPLANT
INJECTOR/SNARE I SNARE (MISCELLANEOUS) IMPLANT
LUBRICANT JELLY 4.5OZ STERILE (MISCELLANEOUS) IMPLANT
MANIFOLD NEPTUNE II (INSTRUMENTS) IMPLANT
NDL SCLEROTHERAPY 25GX240 (NEEDLE) IMPLANT
NEEDLE SCLEROTHERAPY 25GX240 (NEEDLE) IMPLANT
PAD FLOOR 36X40 (MISCELLANEOUS) ×2 IMPLANT
PROBE APC STR FIRE (PROBE) IMPLANT
PROBE INJECTION GOLD (MISCELLANEOUS)
PROBE INJECTION GOLD 7FR (MISCELLANEOUS) IMPLANT
SNARE ROTATE MED OVAL 20MM (MISCELLANEOUS) IMPLANT
SNARE SHORT THROW 13M SML OVAL (MISCELLANEOUS) IMPLANT
SYR 50ML LL SCALE MARK (SYRINGE) IMPLANT
TRAP SPECIMEN MUCOUS 40CC (MISCELLANEOUS) IMPLANT
TUBING ENDO SMARTCAP PENTAX (MISCELLANEOUS) ×4 IMPLANT
TUBING IRRIGATION ENDOGATOR (MISCELLANEOUS) ×4 IMPLANT
WATER STERILE IRR 1000ML POUR (IV SOLUTION) IMPLANT

## 2023-03-28 NOTE — Progress Notes (Signed)
PROGRESS NOTE  Charles Mckinney  GNF:[id number] DOB: 1951/04/25 DOA: 03/24/2023 PCP: Virl Cagey, MD   Brief Narrative: Patient is a 72 year old male from [facility] with history of hypertension, COPD, chronic hypoxic respiratory failure on 2 L of oxygen , CKD stage IIIb, atrial fibrillation on Eliquis, combined systolic/diastolic CHF, OSA, AAA who presents to the emergency department with complaint of dark blood in the stool multiple times.  He noticed dark red blood in diaper multiple times over the past week.  Takes Eliquis.  On presentation, lab work showed potassium of 2.2, hemoglobin 7.4, creatinine of 1.9 . He denies any nausea, abdomen pain or vomiting.  Blood pressure was soft on presentation.  Patient was admitted for the management of  GI bleed, GI consulted.  Plan for EGD/colonoscopy .Hospital course remarkable for persistent A-fib with RVR, hypokalemia, melanotic stools.  Currently on amiodarone.  Cardiology following.  Palliative care also consulted for goals of care.  Assessment & Plan:  Acute lower GI bleed: Presented with hematochezia, dark stool.  Hemoglobin of 7.4 on presentation.  Given 2 units of blood transfusion.  GI consulted.  Hemoglobin stable in the range of 9 this morning. Will continue monitor H&H.  GI planning for EGD/colonoscopy.  On Protonix  Persistent A-fib with RVR: On Eliquis at home and metoprolol for rate control.   Eliquis on hold. Blood pressure was soft on 4/22 and he was not rapid A-fib so he was sent down to stepdown and started on amiodarone drip.  Rate is better today.  Gram-negative bacteremia: Had mild leukocytosis, became febrile on 4/21.  Urine culture/blood cultures showing E. coli.  Also has mild erythema bilateral lower extremities.  Currently on ceftriaxone.  Follow-up final blood culture report   AKI in CKD stage IIIa: Baseline creatinine was 1.5 ,3 years ago . Presented with creatinine of 1.9.  Most likely prerenal in the setting of blood  loss.  antihypertensives on hold.  Kidney function has normalized  Combined systolic/diastolic CHF: .  Diuretics on hold due to low blood pressure, AKI.  Echo done on 10/2018 showed EF of 30 to 35%, diffuse hypokinesis. Has chronic edema bilateral lower extremities.  Cardiology consulted   Hypokalemia: Being aggressively supplemented and monitored.  Magnesium level normal   OSA: On CPAP   COPD: Not in exacerbation.  Uses 2L of at home.   Elevated alkaline phosphatase: Elevated GGT. liver enzymes normal.  Right upper quadrant ultrasound showed hepatic steatosis  Hyponatremia: Normalized    Debility/deconditioning/obesity: Morbidly obese patient.  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  Ambulates with the help of wheelchair.  Lives at a SNF, [facility] consulted.  Very poor quality of life due to several comorbidities.  Still full code.  Palliaitve care following.   Pressure Injury 03/25/23 Buttocks Right Stage 2 -  Partial thickness loss of dermis presenting as a shallow open injury with a red, pink wound bed without slough. (Active)  03/25/23 1522  Location: Buttocks  Location Orientation: Right  Staging: Stage 2 -  Partial thickness loss of dermis presenting as a shallow open injury with a red, pink wound bed without slough.  Wound Description (Comments):   Present on Admission: Yes  Dressing Type Foam - Lift dressing to assess site every shift 03/28/23 0800     Pressure Injury 03/25/23 Buttocks Right Stage 2 -  Partial thickness loss of dermis presenting as a shallow open injury with a red, pink wound bed without slough. (Active)  03/25/23 1523  Location: Buttocks  Location Orientation: Right  Staging: Stage 2 -  Partial thickness loss of dermis presenting as a shallow open injury with a red, pink wound bed without slough.  Wound Description (Comments):   Present on Admission: Yes  Dressing Type Foam - Lift dressing to assess site every shift 03/28/23 0800    DVT  prophylaxis:SCDs Start: 03/25/23 0408     Code Status: Full Code  Family Communication:Called and discussed with son on phone on 4/23  Patient status: Inpatient  Patient is from : SNF  Anticipated discharge to: SNF   Estimated DC date: 2 to 3 days   Consultants: GI, cardiology, palliative care  Procedures: None  Antimicrobials:  Anti-infectives (From admission, onward)    Start     Dose/Rate Route Frequency Ordered Stop   03/27/23 1000  cefTRIAXone (ROCEPHIN) 2 g in sodium chloride 0.9 % 100 mL IVPB        2 g 200 mL/hr over 30 Minutes Intravenous Every 24 hours 03/27/23 0737     03/26/23 1600  cefTRIAXone (ROCEPHIN) 2 g in sodium chloride 0.9 % 100 mL IVPB  Status:  Discontinued        2 g 200 mL/hr over 30 Minutes Intravenous Every 24 hours 03/26/23 1510 03/27/23 0737   03/26/23 1600  azithromycin (ZITHROMAX) 500 mg in sodium chloride 0.9 % 250 mL IVPB  Status:  Discontinued        500 mg 250 mL/hr over 60 Minutes Intravenous Every 24 hours 03/26/23 1510 03/27/23 0736       Subjective: Patient seen and examined bedside today.  He looks more comfortable today.  Heart rate is better now ranging from 100 -110.  Blood pressure soft but stable.  Diarrhea might have resolved.  Denies abdominal pain or compression of nausea.  Continues to be irritated because he is n.p.o. and cannot eat  Objective: Vitals:   03/28/23 0800 03/28/23 0804 03/28/23 0900 03/28/23 0912  BP:  (!) 99/57 104/69   Pulse:  (!) 54 100 (!) 113  Resp:  20 20   Temp: 98.7 F (37.1 C)     TempSrc: Oral     SpO2:  100% 99%   Weight:      Height:        Intake/Output Summary (Last 24 hours) at 03/28/2023 1030 Last data filed at 03/28/2023 0700 Gross per 24 hour  Intake 1879.55 ml  Output 825 ml  Net 1054.55 ml   Filed Weights   03/25/23 1515 03/26/23 0500 03/27/23 0449  Weight: 129.4 kg 131.2 kg 127.8 kg    Examination:   General exam: Overall comfortable, not in distress, morbidly obese,  deconditioned HEENT: PERRL Respiratory system:  no wheezes or crackles, diminished air sounds bilaterally Cardiovascular system: Irregularly irregular rhythm.  Gastrointestinal system: Abdomen is distended, soft and nontender. Central nervous system: Alert and oriented Extremities: Trace lower EXTR edema Skin: Erythematous change in the lateral lower extremities, patient also  Data Reviewed: I have personally reviewed following labs and imaging studies  CBC: Recent Labs  Lab 03/24/23 2255 03/25/23 0600 03/25/23 1534 03/26/23 0731 03/26/23 1652 03/27/23 0524 03/28/23 0326  WBC 12.1*  --   --  11.8*  --   --  10.4  HGB 7.4*   < > 9.2* 8.5* 8.7* 8.9* 9.1*  HCT 25.0*   < > 31.0* 29.1* 29.9* 30.1* 32.2*  MCV 84.2  --   --  85.6  --   --  89.9  PLT 379  --   --  388  --   --  467*   < > = values in this interval not displayed.   Basic Metabolic Panel: Recent Labs  Lab 03/25/23 0600 03/25/23 0859 03/25/23 0950 03/25/23 1534 03/26/23 0731 03/27/23 0523 03/27/23 0524 03/27/23 1734 03/28/23 0326  NA 123*  --   --  133* 135  --  137  --  136  K 2.6*  --    < > 2.6* 3.1*  --  2.6* 2.8* 3.2*  CL 83*  --   --  92* 100  --  100  --  98  CO2 26  --   --  29 26  --  26  --  25  GLUCOSE 406*  --   --  113* 121*  --  128*  --  112*  BUN 49*  --   --  51* 42*  --  35*  --  26*  CREATININE 1.69*  --   --  1.54* 1.37*  --  1.11  --  1.24  CALCIUM 7.6*  --   --  8.0* 7.9*  --  8.0*  --  8.2*  MG 9.7* 2.4  --   --   --  2.3  --   --   --    < > = values in this interval not displayed.     Recent Results (from the past 240 hour(s))  MRSA Next Gen by PCR, Nasal     Status: None   Collection Time: 03/25/23  3:31 PM   Specimen: Nasal Mucosa; Nasal Swab  Result Value Ref Range Status   MRSA by PCR Next Gen NOT DETECTED NOT DETECTED Final    Comment: (NOTE) The GeneXpert MRSA Assay (FDA approved for NASAL specimens only), is one component of a comprehensive MRSA colonization  surveillance program. It is not intended to diagnose MRSA infection nor to guide or monitor treatment for MRSA infections. Test performance is not FDA approved in patients less than 36 years old. Performed at [facility], [address], [place], [place] [address]   Urine Culture (for pregnant, neutropenic or urologic patients or patients with an indwelling urinary catheter)     Status: Abnormal   Collection Time: 03/26/23 10:39 AM   Specimen: Urine, Clean Catch  Result Value Ref Range Status   Specimen Description   Final    URINE, CLEAN CATCH Performed at [facility], [address] [place], [place] [address]    Special Requests   Final    NONE Performed at [facility], [address], [place], [place] [address]    Culture >=100,000 COLONIES/mL ESCHERICHIA COLI (A)  Final   Report Status 03/28/2023 FINAL  Final   Organism ID, Bacteria ESCHERICHIA COLI (A)  Final      Susceptibility   Escherichia coli - MIC*    AMPICILLIN >=32 RESISTANT Resistant     CEFAZOLIN <=4 SENSITIVE Sensitive     CEFEPIME <=0.12 SENSITIVE Sensitive     CEFTRIAXONE <=0.25 SENSITIVE Sensitive     CIPROFLOXACIN <=0.25 SENSITIVE Sensitive     GENTAMICIN <=1 SENSITIVE Sensitive     IMIPENEM <=0.25 SENSITIVE Sensitive     NITROFURANTOIN <=16 SENSITIVE Sensitive     TRIMETH/SULFA >=320 RESISTANT Resistant     AMPICILLIN/SULBACTAM 8 SENSITIVE Sensitive     PIP/TAZO <=4 SENSITIVE Sensitive     * >=100,000 COLONIES/mL ESCHERICHIA COLI  Culture, blood (Routine X 2) w Reflex to ID Panel  Status: Abnormal (Preliminary result)   Collection Time: 03/26/23  2:36 PM   Specimen: BLOOD  Result Value Ref Range Status   Specimen Description   Final    BLOOD BLOOD LEFT ARM Performed at [facility], [address] [place], [place] [address]    Special Requests   Final    AEROBIC BOTTLE ONLY Blood Culture adequate volume Performed at  [facility], [address] [place], [place] [address]    Culture  Setup Time   Final    GRAM NEGATIVE RODS AEROBIC BOTTLE ONLY CRITICAL RESULT CALLED TO, READ BACK BY AND VERIFIED WITH: PHARMD N. GLOGOVAC 03/27/23 @ 0625 BY AB    Culture (A)  Final    ESCHERICHIA COLI SUSCEPTIBILITIES TO FOLLOW Performed at [facility], [address], [place], [place] [address]    Report Status PENDING  Incomplete  Blood Culture ID Panel (Reflexed)     Status: Abnormal   Collection Time: 03/26/23  2:36 PM  Result Value Ref Range Status   Enterococcus faecalis NOT DETECTED NOT DETECTED Final   Enterococcus Faecium NOT DETECTED NOT DETECTED Final   Listeria monocytogenes NOT DETECTED NOT DETECTED Final   Staphylococcus species NOT DETECTED NOT DETECTED Final   Staphylococcus aureus (BCID) NOT DETECTED NOT DETECTED Final   Staphylococcus epidermidis NOT DETECTED NOT DETECTED Final   Staphylococcus lugdunensis NOT DETECTED NOT DETECTED Final   Streptococcus species NOT DETECTED NOT DETECTED Final   Streptococcus agalactiae NOT DETECTED NOT DETECTED Final   Streptococcus pneumoniae NOT DETECTED NOT DETECTED Final   Streptococcus pyogenes NOT DETECTED NOT DETECTED Final   A.calcoaceticus-baumannii NOT DETECTED NOT DETECTED Final   Bacteroides fragilis NOT DETECTED NOT DETECTED Final   Enterobacterales DETECTED (A) NOT DETECTED Final    Comment: Enterobacterales represent a large order of gram negative bacteria, not a single organism. CRITICAL RESULT CALLED TO, READ BACK BY AND VERIFIED WITH: PHARMD N. GLOGOVAC 03/27/23 @ 0625 BY AB    Enterobacter cloacae complex NOT DETECTED NOT DETECTED Final   Escherichia coli DETECTED (A) NOT DETECTED Final    Comment: CRITICAL RESULT CALLED TO, READ BACK BY AND VERIFIED WITH: PHARMD N. GLOGOVAC 03/27/23 @ 0625 BY AB    Klebsiella aerogenes NOT DETECTED NOT DETECTED Final   Klebsiella oxytoca NOT DETECTED NOT DETECTED Final    Klebsiella pneumoniae NOT DETECTED NOT DETECTED Final   Proteus species NOT DETECTED NOT DETECTED Final   Salmonella species NOT DETECTED NOT DETECTED Final   Serratia marcescens NOT DETECTED NOT DETECTED Final   Haemophilus influenzae NOT DETECTED NOT DETECTED Final   Neisseria meningitidis NOT DETECTED NOT DETECTED Final   Pseudomonas aeruginosa NOT DETECTED NOT DETECTED Final   Stenotrophomonas maltophilia NOT DETECTED NOT DETECTED Final   Candida albicans NOT DETECTED NOT DETECTED Final   Candida auris NOT DETECTED NOT DETECTED Final   Candida glabrata NOT DETECTED NOT DETECTED Final   Candida krusei NOT DETECTED NOT DETECTED Final   Candida parapsilosis NOT DETECTED NOT DETECTED Final   Candida tropicalis NOT DETECTED NOT DETECTED Final   Cryptococcus neoformans/gattii NOT DETECTED NOT DETECTED Final   CTX-M ESBL NOT DETECTED NOT DETECTED Final   Carbapenem resistance IMP NOT DETECTED NOT DETECTED Final   Carbapenem resistance KPC NOT DETECTED NOT DETECTED Final   Carbapenem resistance NDM NOT DETECTED NOT DETECTED Final   Carbapenem resist OXA 48 LIKE NOT DETECTED NOT DETECTED Final   Carbapenem resistance VIM NOT DETECTED NOT DETECTED Final  Comment: Performed at [facility], [address] [place], [place] [address]  Culture, blood (Routine X 2) w Reflex to ID Panel     Status: None (Preliminary result)   Collection Time: 03/26/23  4:52 PM   Specimen: BLOOD  Result Value Ref Range Status   Specimen Description   Final    BLOOD BLOOD LEFT ARM Performed at [facility], [address] [place], [place] [address]    Special Requests   Final    AEROBIC BOTTLE ONLY Blood Culture adequate volume Performed at [facility], [address] [place], [place] [address]    Culture   Final    NO GROWTH 2 DAYS Performed at [facility], [address], [place], [place] [address]    Report Status PENDING  Incomplete     Radiology  Studies: US Abdomen Limited RUQ (LIVER/GB)  Result Date: 03/27/2023 CLINICAL DATA:  Elevated LFTs EXAM: ULTRASOUND ABDOMEN LIMITED RIGHT UPPER QUADRANT COMPARISON:  CT abdomen and pelvis with contrast September 21, 2015 FINDINGS: Gallbladder: Cholelithiasis, measuring up to 7 mm. No gallbladder wall thickening or pericholecystic fluid. No sonographic Murphy sign noted by sonographer. Common bile duct: Diameter: 5 mm Liver: No focal lesion identified. Increased hepatic parenchymal echogenicity. Portal vein is patent on color Doppler imaging with normal direction of blood flow towards the liver. Other: None. IMPRESSION: 1. Cholelithiasis without sonographic evidence of acute cholecystitis. 2. Increased hepatic parenchymal echogenicity, which is nonspecific, but most commonly seen in the setting of hepatic steatosis. Electronically Signed   By: Jacob Moores M.D.   On: 03/27/2023 08:11   DG CHEST PORT 1 VIEW  Result Date: 03/26/2023 CLINICAL DATA:  Cough EXAM: PORTABLE CHEST 1 VIEW COMPARISON:  X-ray 07/19/2019 and older FINDINGS: Film is rotated to the left. Enlarged cardiopericardial silhouette with sternal wires. Calcified aorta. Atrial occlusion clip. Vascular congestion. Small left effusion with some opacity. No pneumothorax. Film is under penetrated. IMPRESSION: Postop chest. Enlarged heart with vascular congestion. Atrial occlusion clip. Small left effusion with adjacent opacity. Infiltrates not excluded. Recommend follow-up. Under penetrated and rotated radiograph Electronically Signed   By: Karen Kays M.D.   On: 03/26/2023 14:17    Scheduled Meds:  Chlorhexidine Gluconate Cloth  6 each Topical Daily   levothyroxine  100 mcg Oral Q0600   metoprolol tartrate  12.5 mg Oral BID   mometasone-formoterol  2 puff Inhalation BID   pantoprazole (PROTONIX) IV  40 mg Intravenous Q12H   sodium chloride flush  3 mL Intravenous Q12H   Continuous Infusions:  amiodarone 30 mg/hr (03/28/23 0700)    cefTRIAXone (ROCEPHIN)  IV Stopped (03/28/23 5784)   potassium chloride 10 mEq (03/28/23 1020)     LOS: 3 days   Burnadette Pop, MD [facility] P4/23/2024, 10:30 AM

## 2023-03-28 NOTE — Op Note (Signed)
[facility] Patient Name: Charles Mckinney Procedure Date: 03/28/2023 MRN: [id number] Attending MD: Kerin Salen , MD, [id number] Date of Birth: Feb 13, 1951 CSN: [id number] Age: 72 Admit Type: Inpatient Procedure:                Upper GI endoscopy Indications:              Acute post hemorrhagic anemia, Melena Providers:                Kerin Salen, MD, Stephens Shire RN, RN, Rozetta Nunnery, Technician Referring MD:             [facility] Medicines:                See the Anesthesia note for documentation of the                            administered medications Complications:            No immediate complications. Estimated blood loss:                            Minimal. Estimated Blood Loss:     Estimated blood loss was minimal. Procedure:                Pre-Anesthesia Assessment:                           - Prior to the procedure, a History and Physical                            was performed, and patient medications and                            allergies were reviewed. The patient's tolerance of                            previous anesthesia was also reviewed. The risks                            and benefits of the procedure and the sedation                            options and risks were discussed with the patient.                            All questions were answered, and informed consent                            was obtained. Prior Anticoagulants: The patient has                            taken Eliquis (apixaban), last dose was 4 days  prior to procedure. ASA Grade Assessment: III - A                            patient with severe systemic disease. After                            reviewing the risks and benefits, the patient was                            deemed in satisfactory condition to undergo the                            procedure.                           After obtaining informed consent,  the endoscope was                            passed under direct vision. Throughout the                            procedure, the patient's blood pressure, pulse, and                            oxygen saturations were monitored continuously. The                            [id number]-H190 ([id number]) Olympus endoscope was introduced                            through the mouth, and advanced to the second part                            of duodenum. The upper GI endoscopy was                            accomplished without difficulty. The patient                            tolerated the procedure well. Scope In: Scope Out: Findings:      Diffuse, white plaques were found in the upper third of the esophagus,       in the middle third of the esophagus and in the lower third of the       esophagus. Biopsies were taken with a cold forceps for histology.      Few non-bleeding superficial gastric ulcers with a clean ulcer base       (Forrest Class III) were found in the gastric antrum. The largest lesion       was 5 mm in largest dimension. Biopsies were taken with a cold forceps       for Helicobacter pylori testing.      The cardia and gastric fundus were normal on retroflexion.      The examined duodenum was normal. Impression:               - Esophageal  plaques were found, suspicious for                            candidiasis. Biopsied.                           - Non-bleeding gastric ulcers with a clean ulcer                            base (Forrest Class III). Biopsied.                           - Normal examined duodenum. Moderate Sedation:      Patient did not receive moderate sedation for this procedure, but       instead received monitored anesthesia care. Recommendation:           - Perform a colonoscopy today.                           - Await pathology results.                           - PPI once a day for 2 weeks. Procedure Code(s):        --- Professional ---                            [phone], Esophagogastroduodenoscopy, flexible,                            transoral; with biopsy, single or multiple Diagnosis Code(s):        --- Professional ---                           K22.9, Disease of esophagus, unspecified                           K25.9, Gastric ulcer, unspecified as acute or                            chronic, without hemorrhage or perforation                           D62, Acute posthemorrhagic anemia                           K92.1, Melena (includes Hematochezia) CPT copyright 2022 American Medical Association. All rights reserved. The codes documented in this report are preliminary and upon coder review may  be revised to meet current compliance requirements. Kerin Salen, MD 03/28/2023 2:33:00 PM This report has been signed electronically. Number of Addenda: 0

## 2023-03-28 NOTE — Progress Notes (Signed)
Initial Nutrition Assessment  DOCUMENTATION CODES:   Morbid obesity  INTERVENTION:  - Full Liquid diet per GI.  - Recommend Ensure Plus High Protein po BID, each supplement provides 350 kcal and 20 grams of protein.  - Monitor weight trends.   NUTRITION DIAGNOSIS:   Increased nutrient needs related to chronic illness (COPD, CKD, CHF) as evidenced by estimated needs.  GOAL:   Patient will meet greater than or equal to 90% of their needs  MONITOR:   PO intake, Supplement acceptance, Diet advancement, Weight trends  REASON FOR ASSESSMENT:   Malnutrition Screening Tool    ASSESSMENT:   72 y.o. male with PMH HTN, COPD, chronic hypoxic respiratory failure on 2 L of oxygen , CKD stage IIIb, combined systolic/diastolic CHF who presented with complaint of dark blood in the stool multiple times, admitted for acute GI bleed.  Patient reports he is in pain and discomfort and just wants to "be done". Frustrated that he is not allowed to eat as he is NPO for EGD today. Feels he can still answer some questions.   Patient reports his UBW is likely around 300# but unsure of exact number. Endorses he has been losing weight but unsure for how long. Per EMR, last weight prior to this admission was from 2020. He is currently weighed at 181#.   Pt shares he was eating fair PTA, doesn't like the food at his SNF.  Patient again expressed frustration about not being able to eat this morning. He would like nutrition supplements (chocolate) when able to eat something. Diet later advanced to full liquids after EGD and colonoscopy.    Medications reviewed and include: -  Labs reviewed:  K+ 3.2   NUTRITION - FOCUSED PHYSICAL EXAM:  Patient in pain, will attempt at next visit  Diet Order:   Diet Order             Diet full liquid Room service appropriate? Yes; Fluid consistency: Thin  Diet effective now                   EDUCATION NEEDS:  Education needs have been  addressed  Skin:  Skin Assessment: Skin Integrity Issues: Skin Integrity Issues:: Stage II Stage II: R buttocks  Last BM:  4/23  Height:  Ht Readings from Last 1 Encounters:  03/27/23  (1.778 m)   Weight:  Wt Readings from Last 1 Encounters:  03/27/23 127.8 kg   BMI:  Body mass index is 40.43 kg/m.  Estimated Nutritional Needs:  Kcal:  2000-2200 kcals Protein:  100-110 grams Fluid:  >/= 2L    Shelle Iron RD, LDN For contact information, refer to [facility].

## 2023-03-28 NOTE — Progress Notes (Signed)
Patient refusing bowel prep overnight and refusing oral potassium. Pt has expressed several times overnight that he doesn't want to be in the hospital anymore and "wants to go home and be comfortable". Patient has expressed that he "does not want any of this." This RN has provided reassurance to pt and notified provider of noncompliance with treatment plan.

## 2023-03-28 NOTE — Op Note (Signed)
[facility] Patient Name: Charles Mckinney Procedure Date: 03/28/2023 MRN: [id number] Attending MD: Kerin Salen , MD, [id number] Date of Birth: [birth year] CSN: [id number] Age: 72 Admit Type: Inpatient Procedure:                Colonoscopy Indications:              This is the patient's first colonoscopy, Rectal                            bleeding Providers:                Kerin Salen, MD, Stephens Shire RN, RN, Rozetta Nunnery, Technician Referring MD:             [facility] Medicines:                See the Anesthesia note for documentation of the                            administered medications Complications:            No immediate complications. Estimated blood loss:                            Minimal. Estimated Blood Loss:     Estimated blood loss was minimal. Procedure:                Pre-Anesthesia Assessment:                           - Prior to the procedure, a History and Physical                            was performed, and patient medications and                            allergies were reviewed. The patient's tolerance of                            previous anesthesia was also reviewed. The risks                            and benefits of the procedure and the sedation                            options and risks were discussed with the patient.                            All questions were answered, and informed consent                            was obtained. Prior Anticoagulants: The patient has                            taken Eliquis (apixaban), last  dose was 4 days                            prior to procedure. ASA Grade Assessment: III - A                            patient with severe systemic disease. After                            reviewing the risks and benefits, the patient was                            deemed in satisfactory condition to undergo the                            procedure.                            - Prior to the procedure, a History and Physical                            was performed, and patient medications and                            allergies were reviewed. The patient's tolerance of                            previous anesthesia was also reviewed. The risks                            and benefits of the procedure and the sedation                            options and risks were discussed with the patient.                            All questions were answered, and informed consent                            was obtained. Prior Anticoagulants: The patient has                            taken Eliquis (apixaban), last dose was 4 days                            prior to procedure. ASA Grade Assessment: III - A                            patient with severe systemic disease. After                            reviewing the risks and benefits, the patient was  deemed in satisfactory condition to undergo the                            procedure.                           After obtaining informed consent, the colonoscope                            was passed under direct vision. Throughout the                            procedure, the patient's blood pressure, pulse, and                            oxygen saturations were monitored continuously. The                            PCF-HQ190L ([id number] Olympus colonoscope was                            introduced through the anus and advanced to the the                            cecum, identified by appendiceal orifice and                            ileocecal valve. The colonoscopy was performed                            without difficulty. The patient tolerated the                            procedure well. The quality of the bowel                            preparation was fair. The ileocecal valve,                            appendiceal orifice, and rectum were photographed. Scope In: 1:52:58 PM Scope  Out: 2:29:17 PM Scope Withdrawal Time: 0 hours 26 minutes 40 seconds  Total Procedure Duration: 0 hours 36 minutes 19 seconds  Findings:      Hemorrhoids were found on perianal exam.      A frond-like/villous, fungating, infiltrative, polypoid, sessile and       ulcerated partially obstructing large mass was found in the       recto-sigmoid colon, from 15 cm to 30 cm from anus. The mass was       circumferential. The mass measured fifteen cm in length. Oozing was       present. Area was tattooed with an injection of 5 mL of Spot (carbon       black). This was biopsied with a hot snare for histology.      Seventeen, 17, sessile polyps were found in the sigmoid colon (1),       descending colon(1), transverse colon(4), ascending  colon(9) and       cecum(2). The polyps were 6 to 20 mm in size. These polyps were removed       with a hot snare. Resection and retrieval were complete.      Multiple large-mouthed and medium-mouthed diverticula were found in the       sigmoid colon, descending colon and transverse colon.      Non-bleeding internal hemorrhoids were found during retroflexion. Impression:               - Preparation of the colon was fair.                           - Hemorrhoids found on perianal exam.                           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 Biopsied. Tattooed.                           - Seventeen, 17, 6 to 20 mm polyps in the sigmoid                            colon (1), descending colon(1), transverse                            colon(4), ascending colon(9) and cecum(2), removed                            with a hot snare. Resected and retrieved.                           - Diverticulosis in the sigmoid colon, in the                            descending colon and in the transverse colon.                           - Non-bleeding internal  hemorrhoids. Moderate Sedation:      Patient did not receive moderate sedation for this procedure, but       instead received monitored anesthesia care. Recommendation:           - Full liquid diet.                           - Continue present medications.                           - Await pathology results.                           - Perform a CT scan (computed tomography)  of chest                            with contrast, abdomen with contrast and pelvis                            with contrast today.                           - Refer to a colo-rectal surgeon today. Procedure Code(s):        --- Professional ---                           [phone], Colonoscopy, flexible; with removal of                            tumor(s), polyp(s), or other lesion(s) by snare                            technique                           [id number], Colonoscopy, flexible; with directed                            submucosal injection(s), any substance Diagnosis Code(s):        --- Professional ---                           K64.8, Other hemorrhoids                           D49.0, Neoplasm of unspecified behavior of                            digestive system                           K56.690, Other partial intestinal obstruction                           D12.5, Benign neoplasm of sigmoid colon                           D12.4, Benign neoplasm of descending colon                           D12.3, Benign neoplasm of transverse colon (hepatic                            flexure or splenic flexure)                           D12.2, Benign neoplasm of ascending colon                           D12.0, Benign neoplasm of cecum  K62.5, Hemorrhage of anus and rectum                           K57.30, Diverticulosis of large intestine without                            perforation or abscess without bleeding CPT copyright 2022 [employer]. All rights reserved. The codes documented  in this report are preliminary and upon coder review may  be revised to meet current compliance requirements. Kerin Salen, MD 03/28/2023 2:40:12 PM This report has been signed electronically. Number of Addenda: 0

## 2023-03-28 NOTE — Progress Notes (Signed)
Rounding Note    Patient Name: Charles Mckinney Date of Encounter: 03/28/2023  [facility] Cardiologist: Bryan Lemma, MD   Subjective   Patient appears to be very discouraged/frustrated with his hospitalization. Unclear that he understands the severity of illness/has been declining bowel prep and other medications.   Inpatient Medications    Scheduled Meds:  Chlorhexidine Gluconate Cloth  6 each Topical Daily   levothyroxine  100 mcg Oral Q0600   metoprolol tartrate  12.5 mg Oral BID   mometasone-formoterol  2 puff Inhalation BID   pantoprazole (PROTONIX) IV  40 mg Intravenous Q12H   sodium chloride flush  3 mL Intravenous Q12H   Continuous Infusions:  amiodarone 30 mg/hr (03/28/23 0700)   cefTRIAXone (ROCEPHIN)  IV Stopped (03/27/23 1059)   potassium chloride 10 mEq (03/28/23 0912)   PRN Meds: acetaminophen **OR** acetaminophen, albuterol, ondansetron (ZOFRAN) IV, mouth rinse   Vital Signs    Vitals:   03/28/23 0700 03/28/23 0800 03/28/23 0804 03/28/23 0912  BP: (!) 99/53  (!) 99/57   Pulse: (!) 110  (!) 54 (!) 113  Resp: 20  20   Temp:  98.7 F (37.1 C)    TempSrc:  Oral    SpO2: 100%  100%   Weight:      Height:        Intake/Output Summary (Last 24 hours) at 03/28/2023 0921 Last data filed at 03/28/2023 0700 Gross per 24 hour  Intake 1879.55 ml  Output 825 ml  Net 1054.55 ml      03/27/2023    4:49 AM 03/26/2023    5:00 AM 03/25/2023    3:15 PM  Last 3 Weights  Weight (lbs) 281 lb 12 oz 289 lb 3.9 oz 285 lb 4.4 oz  Weight (kg) 127.8 kg 131.2 kg 129.4 kg      Telemetry    Atrial fibrillation with improved rate control, 90s-100s overnight with amiodarone - Personally Reviewed  ECG    No new tracing - Personally Reviewed  Physical Exam   GEN: No acute distress.   Neck: No JVD Cardiac: irregularly irregular and rapid (improved).  Respiratory: Poor inspiratory effort this AM. Right lower lobe diminished. Left lower lobe with faint  crackles. GI: Soft, nontender, non-distended  MS: non-pitting edema in bilateral lower extremities. Notable erythema bilaterally to mid-calf.  Neuro:  Nonfocal  Psych: Normal affect   Labs    High Sensitivity Troponin:  No results for input(s): "TROPONINIHS" in the last 720 hours.   Chemistry Recent Labs  Lab 03/25/23 0600 03/25/23 0859 03/25/23 0950 03/26/23 0731 03/27/23 0523 03/27/23 0524 03/27/23 1734 03/28/23 0326  NA 123*  --    < > 135  --  137  --  136  K 2.6*  --    < > 3.1*  --  2.6* 2.8* 3.2*  CL 83*  --    < > 100  --  100  --  98  CO2 26  --    < > 26  --  26  --  25  GLUCOSE 406*  --    < > 121*  --  128*  --  112*  BUN 49*  --    < > 42*  --  35*  --  26*  CREATININE 1.69*  --    < > 1.37*  --  1.11  --  1.24  CALCIUM 7.6*  --    < > 7.9*  --  8.0*  --  8.2*  MG 9.7* 2.4  --   --  2.3  --   --   --   PROT 5.7*  --   --  5.3*  --  5.8*  --  5.7*  ALBUMIN 2.1*  --   --  1.7*  --  2.0*  --  2.2*  AST 35  --   --  29  --  23  --  22  ALT 28  --   --  26  --  23  --  22  ALKPHOS 366*  --   --  334*  --  289*  --  260*  BILITOT 0.7  --   --  0.7  --  0.6  --  0.6  GFRNONAA 43*  --    < > 55*  --  >60  --  >60  ANIONGAP 14  --    < > 9  --  11  --  13   < > = values in this interval not displayed.    Lipids No results for input(s): "CHOL", "TRIG", "HDL", "LABVLDL", "LDLCALC", "CHOLHDL" in the last 168 hours.  Hematology Recent Labs  Lab 03/24/23 2255 03/25/23 0600 03/26/23 0731 03/26/23 1652 03/27/23 0524 03/28/23 0326  WBC 12.1*  --  11.8*  --   --  10.4  RBC 2.97*  --  3.40*  --   --  3.58*  HGB 7.4*   < > 8.5* 8.7* 8.9* 9.1*  HCT 25.0*   < > 29.1* 29.9* 30.1* 32.2*  MCV 84.2  --  85.6  --   --  89.9  MCH 24.9*  --  25.0*  --   --  25.4*  MCHC 29.6*  --  29.2*  --   --  28.3*  RDW 16.2*  --  16.3*  --   --  16.8*  PLT 379  --  388  --   --  467*   < > = values in this interval not displayed.   Thyroid No results for input(s): "TSH", "FREET4" in  the last 168 hours.  BNPNo results for input(s): "BNP", "PROBNP" in the last 168 hours.  DDimer No results for input(s): "DDIMER" in the last 168 hours.   Radiology    US Abdomen Limited RUQ (LIVER/GB)  Result Date: 03/27/2023 CLINICAL DATA:  Elevated LFTs EXAM: ULTRASOUND ABDOMEN LIMITED RIGHT UPPER QUADRANT COMPARISON:  CT abdomen and pelvis with contrast September 21, 2015 FINDINGS: Gallbladder: Cholelithiasis, measuring up to 7 mm. No gallbladder wall thickening or pericholecystic fluid. No sonographic Murphy sign noted by sonographer. Common bile duct: Diameter: 5 mm Liver: No focal lesion identified. Increased hepatic parenchymal echogenicity. Portal vein is patent on color Doppler imaging with normal direction of blood flow towards the liver. Other: None. IMPRESSION: 1. Cholelithiasis without sonographic evidence of acute cholecystitis. 2. Increased hepatic parenchymal echogenicity, which is nonspecific, but most commonly seen in the setting of hepatic steatosis. Electronically Signed   By: Jacob Moores M.D.   On: 03/27/2023 08:11   DG CHEST PORT 1 VIEW  Result Date: 03/26/2023 CLINICAL DATA:  Cough EXAM: PORTABLE CHEST 1 VIEW COMPARISON:  X-ray 07/19/2019 and older FINDINGS: Film is rotated to the left. Enlarged cardiopericardial silhouette with sternal wires. Calcified aorta. Atrial occlusion clip. Vascular congestion. Small left effusion with some opacity. No pneumothorax. Film is under penetrated. IMPRESSION: Postop chest. Enlarged heart with vascular congestion. Atrial occlusion clip. Small left effusion with adjacent opacity. Infiltrates not excluded. Recommend follow-up.  Under penetrated and rotated radiograph Electronically Signed   By: Karen Kays M.D.   On: 03/26/2023 14:17    Cardiac Studies   10/18/18 TTE   Study Conclusions   - Procedure narrative: Technically difficult study with poor echo    windows. Definity contrast not given.  - Left ventricle: The cavity size was  normal. Wall thickness was    increased in a pattern of mild LVH. Systolic function was    moderately to severely reduced. The estimated ejection fraction    was in the range of 30% to 35%. Diffuse hypokinesis. The study is    not technically sufficient to allow evaluation of LV diastolic    function.  - Mitral valve: Mildly thickened leaflets . There was mild    regurgitation.  - Left atrium: The atrium was normal in size.  - Atrial septum: No defect or patent foramen ovale was identified.  - Inferior vena cava: The vessel was normal in size. The    respirophasic diameter changes were in the normal range (>= 50%),    consistent with normal central venous pressure.   Impressions:   - Technically difficult study. Compared to a prior study in 2018,    the LVEF is unchanged at 30-35% wtih global hypokinesis.   Patient Profile     Charles Mckinney is a 72 y.o. 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 Renford Dills.   Mr. Litsey presented to the hospital from SNF for evaluation of bloody bowel movements, noted over the past week. [name] initial lab workup notable for K 2.2, HBG 7.4, alk phos 409, AST 56, WBC 12.1. Initial BP with EMS was reportedly 82/54. In the ED, patient received 2 units PRBC as well as IV potassium. Blood cultures also obtained, positive for E.coli. Due to hypotension on admission, metoprolol and Eliquis held and GI was consulted. Plan was for colonoscopy but patient found with severe hypokalemia, K 2.6 as well as increased RVR with afib and procedure was canceled.   Assessment & Plan    Chronic atrial fibrillation, acute RVR Secondary hypercoagulable state   Patient with hx  longstanding afib. Previously was on amiodarone, metoprolol, Eliquis. At some point since being seen by [facility] in 2020, Amiodarone was discontinued. Patient now admitted with acute GI bleed, E.coli bacteremia and having RVR.   Patient is acutely ill, very much in need of having a colonoscopy but had his case cancelled due to RVR.  Amiodarone initiated yesterday given low BP, no ability to increase metoprolol and Digoxin contraindicated with CKD. Patient remains in afib this AM but rates are much better controlled, low 100s. Continue IV amiodarone loading today, would transition to PO tomorrow. Continue holding Eliquis with acute bleed and anemia Continue low dose Metoprolol 12.5mg  BID. Appears that patient is declining bowel prep and potassium supplementation. Would consider palliative care involvement to review goals of care.   Chronic HFrEF   Last echo in 2019 showed LVEF 30-35%. Patient home meds list Bumex 4mg  daily with PRN 2mg  extra for weight gain in addition to Monday/Wednesday/Friday Metolazone. This is quite different from patient's regimen at [facility]. Per patient, cardiac medication management has  been by facility medical staff. CXR underpenetrated but does show enlarged heart with vascular congestion, small left effusion.    Not a great candidate for Entresto/ARB/ACEi, MRA given CKD and acute hypotension Continue holding diuretics with low K/low BP   CAD  Hypercholesterolemia   Patient without anginal symptoms per limited HPI. Continue secondary prevention.   Hypokalemia  K remains low but is improved this AM, 3.2. Recommend replacing to reach K>4.  Per primary team: GI bleed E.coli bacteremia OSA COPD Elevated alk phos Deconditioning     For questions or updates, please contact [facility] Please consult [email or web address] for contact info under        Signed, Perlie Gold, PA-C  03/28/2023, 9:21 AM

## 2023-03-28 NOTE — Transfer of Care (Signed)
Immediate Anesthesia Transfer of Care Note  Patient: CHARBEL LOS  Procedure(s) Performed: Procedure(s): ESOPHAGOGASTRODUODENOSCOPY (EGD) WITH PROPOFOL (N/A) COLONOSCOPY WITH PROPOFOL (N/A) BIOPSY POLYPECTOMY  Patient Location: PACU  Anesthesia Type:MAC  Level of Consciousness:  sedated, patient cooperative and responds to stimulation  Airway & Oxygen Therapy:Patient Spontanous Breathing and Patient connected to face mask oxgen  Post-op Assessment:  Report given to PACU RN and Post -op Vital signs reviewed and stable  Post vital signs:  Reviewed and stable  Last Vitals:  Vitals:   03/28/23 1158 03/28/23 1256  BP: 99/63 121/63  Pulse: (!) 51 (!) 123  Resp: 16 (!) 21  Temp: 37.1 C (!) 36.1 C  SpO2: 100% 100%    Complications: No apparent anesthesia complications

## 2023-03-28 NOTE — Anesthesia Preprocedure Evaluation (Signed)
Anesthesia Evaluation  Patient identified by MRN, date of birth, ID band Patient awake    History of Anesthesia Complications (+) PONV and history of anesthetic complications  Airway Mallampati: III  TM Distance: >3 FB Neck ROM: Full    Dental   Pulmonary sleep apnea , former smoker   breath sounds clear to auscultation       Cardiovascular hypertension, Pt. on medications and Pt. on home beta blockers + CAD, + CABG, + Peripheral Vascular Disease and +CHF  + dysrhythmias Atrial Fibrillation  Rhythm:Irregular Rate:Tachycardia  EF 30-35%   Neuro/Psych TIA   GI/Hepatic Neg liver ROS,GERD  ,,  Endo/Other  Hypothyroidism    Renal/GU CRFRenal disease     Musculoskeletal  (+) Arthritis ,    Abdominal   Peds  Hematology  (+) Blood dyscrasia, anemia   Anesthesia Other Findings   Reproductive/Obstetrics                             Anesthesia Physical Anesthesia Plan  ASA: 4  Anesthesia Plan: MAC   Post-op Pain Management:    Induction:   PONV Risk Score and Plan: 2 and Propofol infusion  Airway Management Planned: Natural Airway and Nasal Cannula  Additional Equipment:   Intra-op Plan:   Post-operative Plan:   Informed Consent: I have reviewed the patients History and Physical, chart, labs and discussed the procedure including the risks, benefits and alternatives for the proposed anesthesia with the patient or authorized representative who has indicated his/her understanding and acceptance.       Plan Discussed with:   Anesthesia Plan Comments:        Anesthesia Quick Evaluation

## 2023-03-28 NOTE — Interval H&P Note (Signed)
History and Physical Interval Note: 71/male, was on eliquis, admitted with black and bloody stools and anemia for an EGD and colonoscopy with propofol.  03/28/2023 1:17 PM  Charles Mckinney  has presented today for EGD and colonoscopy, with the diagnosis of black and bloody stools, anemia, was on eliquis.  The various methods of treatment have been discussed with the patient and family. After consideration of risks, benefits and other options for treatment, the patient has consented to  Procedure(s): ESOPHAGOGASTRODUODENOSCOPY (EGD) WITH PROPOFOL (N/A) COLONOSCOPY WITH PROPOFOL (N/A) as a surgical intervention.  The patient's history has been reviewed, patient examined, no change in status, stable for surgery.  I have reviewed the patient's chart and labs.  Questions were answered to the patient's satisfaction.     Kerin Salen

## 2023-03-28 NOTE — Consult Note (Signed)
Consultation Note Date: 03/28/2023   Patient Name: Charles Mckinney  DOB: Jan 15, 1951  MRN: [id number]  Age / Sex: 72 y.o., male   PCP: Virl Cagey, MD Referring Physician: Burnadette Pop, MD  Reason for Consultation: Establishing goals of care     Chief Complaint/History of Present Illness:   Patient is a 72 year old male with a past medical history of hypertension, COPD, chronic hypoxic respiratory failure on 2 L of oxygen, CKD stage IIIb, atrial fibrillation on Eliquis, combined systolic/diastolic CHF, OSA, AAA, and severe arthritis leading to bedbound status with pressure ulcers who was admitted on 03/24/2019 for for management of multiple dark stools.  Since admission, GI has been consulted for workup of acute lower GI bleed.  Patient also noted to have severe sepsis secondary to gram-negative bacteremia for which she is currently receiving antibiotics and is on pressor support.  Palliative medicine team consulted to assist with complex medical decision making.  Discussed care with hospitalist prior to seeing patient today. Discussed care with bedside RN for medical updates.  Has been refusing bowel prep and stating he just wants to "be comfortable".  Presented to bedside to see patient.  No family present at bedside upon presentation.  Introduced myself and role of the palliative medicine team to patient.  Patient seen laying comfortably in bed.  Patient able to participate in conversation though unable to answer detailed questions.  Patient is not sure why he was brought to the hospital.  Patient notes he is scheduled to have procedures today though cannot tell me why.  Patient voices immense frustration at being n.p.o. and so wants to get the procedures over so he can eat again.  Patient noted he is also frustrated with being in the hospital at all and wants to return to his long-term care facility.  Explained to him the procedure today would be searching for source of GI bleeding. On  further exploring patient's wishes for care, patient is able to state that he would like to have the procedure to see if source of bleeding can be found.  He just wants the procedure with so he can eat again.  Did discuss possible pathway of foregoing procedure and focusing on his comfort with symptom management back at his facility.  Patient again stating he is willing to proceed with procedure, wants that over soon because he wants to be able to eat.  He acknowledged his wishes on this. Did explore why patient had been refusing bowel prep for procedure.  Patient is confused and stated that he was never offered bowel prep; noted he has been offered multiple times though he does not remember this.  During conversation patient had issues with inattention.  Noted would reach out to his son to further discuss care.  Able to call patient's listed NOK/son in chart, Charles Mckinney.  Introduced myself and the role of the palliative medicine team.  Notes medical plans for scope to find source of GI bleed.  Charles Mckinney acknowledges patient is likely frustrated because he has been unable to eat.  Charles Mckinney notes patient can become "hard headed" when he is not allowed to do what he wants.  Acknowledged this.  Charles Mckinney would like to proceed with further GI workup at this time. Inquired about what patient likes to do at his long-term care facility to provide more psychosocial support.  Charles Mckinney acknowledges that patient's mobility has been quite limited.  Charles Mckinney states that patient does enjoy spending time with visitors and watching TV.  About other  family support and Charles Mckinney notes that he, his brother, and his mother have been supporting the patient.  Patient and his wife are divorced so next of kin and decision makers would be 2 sons. Charles Mckinney requested regular updates on patient regarding scope findings and changes in medical care.  Acknowledged  and noted would pass on to care team.  All questions answered at that time.  Thanked son  for allowing me to speak with him today.  Updated care team regarding discussion with patient and son.  Primary Diagnoses  Present on Admission:  GI bleeding  Symptomatic anemia  Persistent atrial fibrillation (HCC): CHA2DS2Vasc 6  OSA (obstructive sleep apnea)  Hypokalemia  Coronary artery disease involving native coronary artery of native heart with angina pectoris  Acute renal failure superimposed on stage 3a chronic kidney disease  Chronic combined systolic and diastolic CHF, NYHA class 3  Elevated alkaline phosphatase level   Palliative Review of Systems: hungry  Past Medical History:  Diagnosis Date   AAA (abdominal aortic aneurysm) without rupture 03/14/2019   Anxiety    CAD, multiple vessel 04/2015   Right and Left Heart Catheterization Apr 09, 2015: LM 95%; ostLAD 99% - p-dLAD 80%, D1 99% - D2 80%; OCx 90%,pCx 50%; pRCA 80%, mRCA 50%, rPDA 80% & 90%. --> CABG x 4: LIMA-LAD, SVG-Diag, SeqSVG-rPL-rPDA --> Intra-Op TEE showed EF of 30% with only mild MR.    Chronic combined systolic and diastolic CHF, NYHA class 3 01/19/2015   Chronic combined systolic and diastolic heart failure, NYHA class 3 01/2014   EF is been ranging from 30-35% since February 2015.  Suggestion of anterior infarct on Myoview/echo.   CKD (chronic kidney disease), stage III 05/29/2017   Coronary artery disease involving native coronary artery of native heart with angina pectoris 02/02/2016   Cough    Essential hypertension 05/30/2017   GERD 11/22/2007   GERD (gastroesophageal reflux disease)    Gout 05/30/2017   Hyperlipidemia    Hyperlipidemia with target LDL less than 70    Hypertension    Hypertensive heart disease    Mild to moderately dilated LV with mild LVH.   Hypothyroidism due to medication    Ischemic cardiomyopathy 01/2014   EF 30-35%.  Severe multivessel CAD noted on cath--CABG x4 -> Myoview (June 2018) shows large anterior-septal-apical infarct with no ischemia.   Kidney stones    Long term  current use of anticoagulant therapy 01/19/2015   Morbid obesity    Obesity (BMI 30-39.9)    OSA (obstructive sleep apnea) 10/04/2017   Osteoarthritis    Permanent atrial fibrillation    On Amiodarone & Beta Blocker (on Xarelto)   Persistent atrial fibrillation (HCC): CHA2DS2Vasc 6    Cardioversion 01/19/15 and amiodarone begun  This patients CHA2DS2-VASc Score and unadjusted Ischemic Stroke Rate (% per year) is equal to 9.7 % stroke rate/year from a score of 6  Above score calculated as 1 point each if present [CHF, HTN, DM, Vascular=MI/PAD/Aortic Plaque, Age if 65-74, or Male] Above score calculated as 2 points each if present [Age > 75, or Stroke/TIA/TE]    PONV (postoperative nausea and vomiting)    Prolonged QT interval 04/26/2017   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   TIA (transient ischemic attack) 04/26/2017   Social History   Socioeconomic History   Marital status: Divorced    Spouse name: Not on file   Number of children: Not on file   Years of education: Not on file   Highest education  level: Not on file  Occupational History   Occupation: disabled  Tobacco Use   Smoking status: Former    Types: Cigarettes    Quit date: 2000    Years since quitting: 24.3   Smokeless tobacco: Never  Vaping Use   Vaping Use: Never used  Substance and Sexual Activity   Alcohol use: No   Drug use: No   Sexual activity: Yes  Other Topics Concern   Not on file  Social History Narrative   Not on file   Social Determinants of Health   Financial Resource Strain: Not on file  Food Insecurity: No Food Insecurity (03/25/2023)   Hunger Vital Sign    Worried About Running Out of Food in the Last Year: Never true    Ran Out of Food in the Last Year: Never true  Transportation Needs: No Transportation Needs (03/25/2023)   PRAPARE - Administrator, Civil Service (Medical): No    Lack of Transportation (Non-Medical): No  Physical Activity: Not on file  Stress: Not on file  Social Connections:  Not on file   Family History  Problem Relation Age of Onset   Diabetes Mother    Heart disease Father    Congestive Heart Failure Sister    Stroke Brother    Heart disease Brother    Diabetes Brother    Scheduled Meds:  Chlorhexidine Gluconate Cloth  6 each Topical Daily   levothyroxine  100 mcg Oral Q0600   metoprolol tartrate  12.5 mg Oral BID   mometasone-formoterol  2 puff Inhalation BID   pantoprazole (PROTONIX) IV  40 mg Intravenous Q12H   sodium chloride flush  3 mL Intravenous Q12H   Continuous Infusions:  amiodarone 30 mg/hr (03/28/23 0700)   cefTRIAXone (ROCEPHIN)  IV Stopped (03/27/23 1059)   potassium chloride 10 mEq (03/28/23 0808)   PRN Meds:.acetaminophen **OR** acetaminophen, albuterol, ondansetron (ZOFRAN) IV, mouth rinse Allergies  Allergen Reactions   Niacin And Related Other (See Comments)    FLUSHING   Xarelto [Rivaroxaban] Other (See Comments)    Cause bloody stools. Switched to eliquis low dose due to bleeding.    Penicillins Itching, Rash and Other (See Comments)    Has patient had a PCN reaction causing immediate rash, facial/tongue/throat swelling, SOB or lightheadedness with hypotension: Yes Has patient had a PCN reaction causing severe rash involving mucus membranes or skin necrosis: No Has patient had a PCN reaction that required hospitalization: No Has patient had a PCN reaction occurring within the last 10 years: No If all of the above answers are "NO", then may proceed with Cephalosporin use.    CBC:    Component Value Date/Time   WBC 10.4 03/28/2023 0326   HGB 9.1 (L) 03/28/2023 0326   HCT 32.2 (L) 03/28/2023 0326   PLT 467 (H) 03/28/2023 0326   MCV 89.9 03/28/2023 0326   NEUTROABS 5.3 07/15/2019 1512   LYMPHSABS 1.2 07/15/2019 1512   MONOABS 0.6 07/15/2019 1512   EOSABS 0.3 07/15/2019 1512   BASOSABS 0.1 07/15/2019 1512   Comprehensive Metabolic Panel:    Component Value Date/Time   NA 136 03/28/2023 0326   NA 144 07/17/2018  1232   K 3.2 (L) 03/28/2023 0326   CL 98 03/28/2023 0326   CO2 25 03/28/2023 0326   BUN 26 (H) 03/28/2023 0326   BUN 27 07/17/2018 1232   CREATININE 1.24 03/28/2023 0326   GLUCOSE 112 (H) 03/28/2023 0326   GLUCOSE 99 09/12/2006 1309  CALCIUM 8.2 (L) 03/28/2023 0326   AST 22 03/28/2023 0326   ALT 22 03/28/2023 0326   ALKPHOS 260 (H) 03/28/2023 0326   BILITOT 0.6 03/28/2023 0326   PROT 5.7 (L) 03/28/2023 0326   ALBUMIN 2.2 (L) 03/28/2023 0326    Physical Exam: Vital Signs: BP (!) 99/57   Pulse (!) 54   Temp 98.7 F (37.1 C) (Oral)   Resp 20   Ht  (1.778 m)   Wt 127.8 kg   SpO2 100%   BMI 40.43 kg/m  SpO2: SpO2: 100 % O2 Device: O2 Device: Nasal Cannula O2 Flow Rate: O2 Flow Rate (L/min): 3 L/min Intake/output summary:  Intake/Output Summary (Last 24 hours) at 03/28/2023 6962 Last data filed at 03/28/2023 0700 Gross per 24 hour  Intake 1879.55 ml  Output 825 ml  Net 1054.55 ml   LBM: Last BM Date : 03/27/23 Baseline Weight: Weight: 129.4 kg Most recent weight: Weight: 127.8 kg  General: NAD, laying in bed, awake and able to interact though unable to discuss complex medical matters due to inattention  Eyes: conjunctiva clear, anicteric sclera HENT: moist mucous membranes Cardiovascular: tachycardia noted Respiratory: no increased work of breathing noted, not in respiratory distress, on [place] O2 Abdomen: obese Skin: no rashes or lesions on visible skin Neuro: awake and able to interact though unable to discuss complex medical matters  Psych: inattentive, confused at times          Palliative Performance Scale: 30%              Additional Data Reviewed: Recent Labs    03/26/23 0731 03/26/23 1652 03/27/23 0524 03/28/23 0326  WBC 11.8*  --   --  10.4  HGB 8.5*   < > 8.9* 9.1*  PLT 388  --   --  467*  NA 135  --  137 136  BUN 42*  --  35* 26*  CREATININE 1.37*  --  1.11 1.24   < > = values in this interval not displayed.    Imaging: US Abdomen  Limited RUQ (LIVER/GB) CLINICAL DATA:  Elevated LFTs  EXAM: ULTRASOUND ABDOMEN LIMITED RIGHT UPPER QUADRANT  COMPARISON:  CT abdomen and pelvis with contrast September 21, 2015  FINDINGS: Gallbladder:  Cholelithiasis, measuring up to 7 mm. No gallbladder wall thickening or pericholecystic fluid. No sonographic Murphy sign noted by sonographer.  Common bile duct:  Diameter: 5 mm  Liver:  No focal lesion identified. Increased hepatic parenchymal echogenicity. Portal vein is patent on color Doppler imaging with normal direction of blood flow towards the liver.  Other: None.  IMPRESSION: 1. Cholelithiasis without sonographic evidence of acute cholecystitis. 2. Increased hepatic parenchymal echogenicity, which is nonspecific, but most commonly seen in the setting of hepatic steatosis.  Electronically Signed   By: Jacob Moores M.D.   On: 03/27/2023 08:11    I personally reviewed recent imaging.   Palliative Care Assessment and Plan Summary of Established Goals of Care and Medical Treatment Preferences   Patient is a 72 year old male with a past medical history of hypertension, COPD, chronic hypoxic respiratory failure on 2 L of oxygen, CKD stage IIIb, atrial fibrillation on Eliquis, combined systolic/diastolic CHF, OSA, AAA, and severe arthritis leading to bedbound status with pressure ulcers who was admitted on 03/24/2019 for for management of multiple dark stools.  Since admission, GI has been consulted for workup of acute lower GI bleed.  Patient also noted to have severe sepsis secondary to gram-negative bacteremia for which she  is currently receiving antibiotics and is on pressor support.  Palliative medicine team consulted to assist with complex medical decision making.  # Complex medical decision making/goals of care  -Discussed care with patient today.  Though he is able to interact, he is easily confused and shows signs of inattention.  Based on discussion with RN,  concerned about delirium with a waxing and waning mental status.  During discussion patient is able to note he is willing to go through with the procedure though is frustrated about not being able to eat which is his primary concern. Patient wants to return to his LTC facility as soon as medical able to do so.   -Discussed care with patient's NOK/son, Charles Mckinney, as detailed above in HPI.  Orvill noted patient has 2 sons, including him, and an ex-wife as family support.  Kairon would want patient to continue with the procedure at this time.  Keddrick noted he would appreciate regular updates regarding patient's medical status.  -  Code Status: Full Code   # Symptom management  Non-pharmacologic Delirium Precautions  - Frequent re-orientation; update board w/ date, names of treatment team  - Daytime stimulation: Open curtains, turn lights on, and turn on television as tolerated during waking hours  - Nighttime calm - close curtains, turn lights off, and turn off television at 9pm  with minimal interruptions from treatment team during sleeping hours, including avoiding lab draws if possible  - Encourage continued presence of family/friends  - Occupy w/ distractions - e.g. ask them to fold washcloths, busy-board  - Encourage movement with PT/OT as tolerated  - Ensure adequate sensorium, to include providing glasses, dentures, and hearing aids to patient as appropriate  - Ensure adequate nutrition and fluid intake  - Avoid restraints whenever possible, but "safety first" . If restraints are necessary, please monitor CPK and BUN/Cr  - Assess and treat pain (incl nonverbal cues); e.g. consider scheduled tylenol  - Assess and treat constipation and urinary retention  - Ensure hydration, electrolyte balance (K to 4.0, Mg to 2.0), adequate oxygenation  - Review medications (addition, deletion, changes can trigger)  - If possible, avoid "high-risk" meds such as anticholinergics, BZ, steroids,  antidepressants   # Psycho-social/Spiritual Support:  - Support System: 2 sons, ex-wife  # Discharge Planning:  TBD  Thank you for allowing the palliative care team to participate in the care [name].  Alvester Morin, DO Palliative Care Provider PMT # [phone]  This provider spent a total of 80 minutes providing patient's care.  Includes review of EMR, discussing care with other staff members involved in patient's medical care, obtaining relevant history and information from patient and/or patient's family, and personal review of imaging and lab work. Greater than 50% of the time was spent counseling and coordinating care related to the above assessment and plan.    *Please note that this is a verbal dictation therefore any spelling or grammatical errors are due to the "[employer]" system interpretation.

## 2023-03-29 ENCOUNTER — Inpatient Hospital Stay (HOSPITAL_COMMUNITY): Payer: No Typology Code available for payment source

## 2023-03-29 ENCOUNTER — Encounter (HOSPITAL_COMMUNITY): Payer: Self-pay | Admitting: Family Medicine

## 2023-03-29 DIAGNOSIS — I5042 Chronic combined systolic (congestive) and diastolic (congestive) heart failure: Secondary | ICD-10-CM | POA: Diagnosis not present

## 2023-03-29 DIAGNOSIS — Z515 Encounter for palliative care: Secondary | ICD-10-CM | POA: Diagnosis not present

## 2023-03-29 DIAGNOSIS — Z01810 Encounter for preprocedural cardiovascular examination: Secondary | ICD-10-CM

## 2023-03-29 DIAGNOSIS — R7881 Bacteremia: Secondary | ICD-10-CM | POA: Diagnosis not present

## 2023-03-29 DIAGNOSIS — I4821 Permanent atrial fibrillation: Secondary | ICD-10-CM | POA: Diagnosis not present

## 2023-03-29 DIAGNOSIS — C189 Malignant neoplasm of colon, unspecified: Secondary | ICD-10-CM

## 2023-03-29 DIAGNOSIS — N179 Acute kidney failure, unspecified: Secondary | ICD-10-CM | POA: Diagnosis not present

## 2023-03-29 DIAGNOSIS — K922 Gastrointestinal hemorrhage, unspecified: Secondary | ICD-10-CM | POA: Diagnosis not present

## 2023-03-29 LAB — BASIC METABOLIC PANEL
Anion gap: 10 (ref 5–15)
BUN: 20 mg/dL (ref 8–23)
CO2: 22 mmol/L (ref 22–32)
Calcium: 7.9 mg/dL — ABNORMAL LOW (ref 8.9–10.3)
Chloride: 101 mmol/L (ref 98–111)
Creatinine, Ser: 1.12 mg/dL (ref 0.61–1.24)
GFR, Estimated: >60 mL/min (ref >60)
Glucose, Bld: 123 mg/dL — ABNORMAL HIGH (ref 70–99)
Potassium: 2.9 mmol/L — ABNORMAL LOW (ref 3.5–5.1)
Sodium: 133 mmol/L — ABNORMAL LOW (ref 135–145)

## 2023-03-29 LAB — CBC
HCT: 29.8 % — ABNORMAL LOW (ref 39.0–52.0)
Hemoglobin: 8.4 g/dL — ABNORMAL LOW (ref 13.0–17.0)
MCH: 25.5 pg — ABNORMAL LOW (ref 26.0–34.0)
MCHC: 28.2 g/dL — ABNORMAL LOW (ref 30.0–36.0)
MCV: 90.3 fL (ref 80.0–100.0)
Platelets: 448 10*3/uL — ABNORMAL HIGH (ref 150–400)
RBC: 3.3 MIL/uL — ABNORMAL LOW (ref 4.22–5.81)
RDW: 16.7 % — ABNORMAL HIGH (ref 11.5–15.5)
WBC: 9.8 10*3/uL (ref 4.0–10.5)
nRBC: 0 % (ref 0.0–0.2)

## 2023-03-29 LAB — POTASSIUM: Potassium: 4 mmol/L (ref 3.5–5.1)

## 2023-03-29 LAB — CULTURE, BLOOD (ROUTINE X 2)

## 2023-03-29 LAB — SURGICAL PATHOLOGY

## 2023-03-29 MED ORDER — FUROSEMIDE 10 MG/ML IJ SOLN
40.0000 mg | Freq: Once | INTRAMUSCULAR | Status: AC
  Administered 2023-03-29: 40 mg via INTRAVENOUS
  Filled 2023-03-29: qty 4

## 2023-03-29 MED ORDER — POTASSIUM CHLORIDE 20 MEQ PO PACK
40.0000 meq | PACK | Freq: Once | ORAL | Status: AC
  Administered 2023-03-29: 40 meq via ORAL
  Filled 2023-03-29: qty 2

## 2023-03-29 MED ORDER — IOHEXOL 9 MG/ML PO SOLN
ORAL | Status: AC
  Filled 2023-03-29: qty 1000

## 2023-03-29 MED ORDER — ENSURE ENLIVE PO LIQD
237.0000 mL | Freq: Two times a day (BID) | ORAL | Status: DC
  Administered 2023-03-29 – 2023-04-08 (×16): 237 mL via ORAL

## 2023-03-29 MED ORDER — IOHEXOL 300 MG/ML  SOLN
100.0000 mL | Freq: Once | INTRAMUSCULAR | Status: AC | PRN
  Administered 2023-03-29: 100 mL via INTRAVENOUS

## 2023-03-29 MED ORDER — POTASSIUM CHLORIDE 10 MEQ/100ML IV SOLN
10.0000 meq | INTRAVENOUS | Status: AC
  Administered 2023-03-29 (×6): 10 meq via INTRAVENOUS
  Filled 2023-03-29 (×6): qty 100

## 2023-03-29 MED ORDER — IOHEXOL 9 MG/ML PO SOLN
500.0000 mL | ORAL | Status: AC
  Administered 2023-03-29 (×2): 500 mL via ORAL

## 2023-03-29 NOTE — Progress Notes (Addendum)
PROGRESS NOTE  Charles Mckinney  RUE:[id number] DOB: 1951-01-13 DOA: 03/24/2023 PCP: Virl Cagey, MD   Brief Narrative: Patient is a 72 year old male from [facility] with history of hypertension, COPD, chronic hypoxic respiratory failure on 2 L of oxygen , CKD stage IIIb, atrial fibrillation on Eliquis, combined systolic/diastolic CHF, OSA, AAA who presents to the emergency department with complaint of dark blood in the stool multiple times.  He noticed dark red blood in diaper multiple times over the past week.  Takes Eliquis.  On presentation, lab work showed potassium of 2.2, hemoglobin 7.4, creatinine of 1.9 . He denies any nausea, abdomen pain or vomiting.  Blood pressure was soft on presentation.  Patient was admitted for the management of  GI bleed, GI consulted. Hospital course remarkable for persistent A-fib with RVR, hypokalemia, melanotic stools.  Currently on amiodarone.  Cardiology following.  Colonoscopy showed large rectosigmoid partially obstructing mass ,likely malignant.  Palliative care also consulted for goals of care.  General surgery following.  Assessment & Plan:  Acute lower GI bleed: Presented with hematochezia, dark stool.  Hemoglobin of 7.4 on presentation.  Given 2 units of blood transfusion.  GI consulted.  Hemoglobin stable in the range of 8 this morning. On Protonix. EGD showed nonbleeding gastric ulcer with a clean ulcer base.  Colonoscopy showed large rectosigmoid partially obstructing mass ,likely malignant.  Biopsy taken.  General surgery consulted  Persistent A-fib with RVR: On Eliquis at home and metoprolol for rate control.   Eliquis on hold. Blood pressure was soft on 4/22 and he was not rapid A-fib so he was sent down to stepdown and started on amiodarone drip.  Rate is better today.  Blood pressure soft but stable.  Cardiology following  Gram-negative bacteremia: Had mild leukocytosis, became febrile on 4/21.  Urine culture/blood cultures showing E. coli.   Also has mild erythema bilateral lower extremities.  Currently on ceftriaxone.    AKI in CKD stage IIIa: Baseline creatinine was 1.5 ,3 years ago . Presented with creatinine of 1.9.  Most likely prerenal in the setting of blood loss.  antihypertensives on hold.  Kidney function has normalized.  Combined systolic/diastolic CHF: Echo done on 10/2018 showed EF of 30 to 35%, diffuse hypokinesis. Has chronic edema bilateral lower extremities.  Cardiology consulted and following.  Appears volume overloaded today.  Given a dose of Lasix IV 40 mg once   Hypokalemia: Being aggressively supplemented and monitored.  Magnesium level normal on 4/22   OSA: On CPAP   COPD: Not in exacerbation.  Uses 2L of at home.   Elevated alkaline phosphatase: Elevated GGT. liver enzymes normal.  Right upper quadrant ultrasound showed hepatic steatosis  Hyponatremia: Stable   Debility/deconditioning/obesity: Morbidly obese patient.  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  Now has possible colorectal cancer, permanent A-fib, E. coli bacteremia.  Ambulates with the help of wheelchair.  Lives at a SNF, [facility] consulted.  Very poor quality of life due to several comorbidities.  Still full code.  Palliaitve care following.  We recommend continuing goals of care discussion and possibly transition to comfort care Nutrition Problem: Increased nutrient needs Etiology: chronic illness (COPD, CKD, CHF) Pressure Injury 03/25/23 Buttocks Right Stage 2 -  Partial thickness loss of dermis presenting as a shallow open injury with a red, pink wound bed without slough. (Active)  03/25/23 1522  Location: Buttocks  Location Orientation: Right  Staging: Stage 2 -  Partial thickness loss of dermis presenting as a  shallow open injury with a red, pink wound bed without slough.  Wound Description (Comments):   Present on Admission: Yes  Dressing Type Foam - Lift dressing to assess site every shift 03/28/23 0800     Pressure  Injury 03/25/23 Buttocks Right Stage 2 -  Partial thickness loss of dermis presenting as a shallow open injury with a red, pink wound bed without slough. (Active)  03/25/23 1523  Location: Buttocks  Location Orientation: Right  Staging: Stage 2 -  Partial thickness loss of dermis presenting as a shallow open injury with a red, pink wound bed without slough.  Wound Description (Comments):   Present on Admission: Yes  Dressing Type Foam - Lift dressing to assess site every shift 03/28/23 0800    DVT prophylaxis:SCDs Start: 03/25/23 0408     Code Status: Full Code  Family Communication:Called and discussed with son on phone on 4/23.called again today,call not received  Patient status: Inpatient  Patient is from : SNF  Anticipated discharge to: Not sure  Estimated DC date: Not sure   Consultants: GI, cardiology, palliative care, general surgery  Procedures: EGD/colonoscopy  Antimicrobials:  Anti-infectives (From admission, onward)    Start     Dose/Rate Route Frequency Ordered Stop   03/27/23 1000  cefTRIAXone (ROCEPHIN) 2 g in sodium chloride 0.9 % 100 mL IVPB        2 g 200 mL/hr over 30 Minutes Intravenous Every 24 hours 03/27/23 0737     03/26/23 1600  cefTRIAXone (ROCEPHIN) 2 g in sodium chloride 0.9 % 100 mL IVPB  Status:  Discontinued        2 g 200 mL/hr over 30 Minutes Intravenous Every 24 hours 03/26/23 1510 03/27/23 0737   03/26/23 1600  azithromycin (ZITHROMAX) 500 mg in sodium chloride 0.9 % 250 mL IVPB  Status:  Discontinued        500 mg 250 mL/hr over 60 Minutes Intravenous Every 24 hours 03/26/23 1510 03/27/23 0736       Subjective: Patient seen and examined at bedside today.  Heart rate is well-controlled today, in low 100s.  Blood pressure stable.  Complains of pain everywhere.  Moaning, crying.  Says I cannot breathe.  Saturating fine on 2 L of oxygen  Objective: Vitals:   03/29/23 0800 03/29/23 0900 03/29/23 0921 03/29/23 1000  BP: 104/67 122/76  122/76 115/75  Pulse: 82 (!) 106 (!) 126 (!) 119  Resp: (!) 26 (!) 27  (!) 27  Temp: 98.8 F (37.1 C)     TempSrc: Axillary     SpO2: 98% 99%  99%  Weight:      Height:        Intake/Output Summary (Last 24 hours) at 03/29/2023 1141 Last data filed at 03/29/2023 1000 Gross per 24 hour  Intake 1719.59 ml  Output 950 ml  Net 769.59 ml   Filed Weights   03/25/23 1515 03/26/23 0500 03/27/23 0449  Weight: 129.4 kg 131.2 kg 127.8 kg    Examination:   General exam: Very deconditioned, morbidly obese, weak HEENT: PERRL Respiratory system: Bilateral basal crackles Cardiovascular system: Irregular regular rhythm.  Gastrointestinal system: Abdomen is obese, soft and nontender. Central nervous system: Alert and oriented Extremities: Bilateral lower extremity pitting edema, no clubbing ,no cyanosis Skin: Pressure ulcer, erythematous bilateral lower extremities  Data Reviewed: I have personally reviewed following labs and imaging studies  CBC: Recent Labs  Lab 03/24/23 2255 03/25/23 0600 03/26/23 0731 03/26/23 1652 03/27/23 0524 03/28/23 0326 03/29/23 0255  WBC 12.1*  --  11.8*  --   --  10.4 9.8  HGB 7.4*   < > 8.5* 8.7* 8.9* 9.1* 8.4*  HCT 25.0*   < > 29.1* 29.9* 30.1* 32.2* 29.8*  MCV 84.2  --  85.6  --   --  89.9 90.3  PLT 379  --  388  --   --  467* 448*   < > = values in this interval not displayed.   Basic Metabolic Panel: Recent Labs  Lab 03/25/23 0600 03/25/23 0859 03/25/23 0950 03/25/23 1534 03/26/23 0731 03/27/23 0523 03/27/23 0524 03/27/23 1734 03/28/23 0326 03/29/23 0255  NA 123*  --   --  133* 135  --  137  --  136 133*  K 2.6*  --    < > 2.6* 3.1*  --  2.6* 2.8* 3.2* 2.9*  CL 83*  --   --  92* 100  --  100  --  98 101  CO2 26  --   --  29 26  --  26  --  25 22  GLUCOSE 406*  --   --  113* 121*  --  128*  --  112* 123*  BUN 49*  --   --  51* 42*  --  35*  --  26* 20  CREATININE 1.69*  --   --  1.54* 1.37*  --  1.11  --  1.24 1.12  CALCIUM 7.6*   --   --  8.0* 7.9*  --  8.0*  --  8.2* 7.9*  MG 9.7* 2.4  --   --   --  2.3  --   --   --   --    < > = values in this interval not displayed.     Recent Results (from the past 240 hour(s))  MRSA Next Gen by PCR, Nasal     Status: None   Collection Time: 03/25/23  3:31 PM   Specimen: Nasal Mucosa; Nasal Swab  Result Value Ref Range Status   MRSA by PCR Next Gen NOT DETECTED NOT DETECTED Final    Comment: (NOTE) The GeneXpert MRSA Assay (FDA approved for NASAL specimens only), is one component of a comprehensive MRSA colonization surveillance program. It is not intended to diagnose MRSA infection nor to guide or monitor treatment for MRSA infections. Test performance is not FDA approved in patients less than 79 years old. Performed at [facility], [address], [place], [place] [address]   Urine Culture (for pregnant, neutropenic or urologic patients or patients with an indwelling urinary catheter)     Status: Abnormal   Collection Time: 03/26/23 10:39 AM   Specimen: Urine, Clean Catch  Result Value Ref Range Status   Specimen Description   Final    URINE, CLEAN CATCH Performed at [facility], [address], [place], [place] [address]    Special Requests   Final    NONE Performed at [facility], [address] [place], [place] [address]    Culture >=100,000 COLONIES/mL ESCHERICHIA COLI (A)  Final   Report Status 03/28/2023 FINAL  Final   Organism ID, Bacteria ESCHERICHIA COLI (A)  Final      Susceptibility   Escherichia coli - MIC*    AMPICILLIN >=32 RESISTANT Resistant     CEFAZOLIN <=4 SENSITIVE Sensitive     CEFEPIME <=0.12 SENSITIVE Sensitive     CEFTRIAXONE <=0.25 SENSITIVE Sensitive     CIPROFLOXACIN <=  0.25 SENSITIVE Sensitive     GENTAMICIN <=1 SENSITIVE Sensitive     IMIPENEM <=0.25 SENSITIVE Sensitive     NITROFURANTOIN <=16 SENSITIVE Sensitive     TRIMETH/SULFA >=320 RESISTANT Resistant      AMPICILLIN/SULBACTAM 8 SENSITIVE Sensitive     PIP/TAZO <=4 SENSITIVE Sensitive     * >=100,000 COLONIES/mL ESCHERICHIA COLI  Culture, blood (Routine X 2) w Reflex to ID Panel     Status: Abnormal   Collection Time: 03/26/23  2:36 PM   Specimen: BLOOD  Result Value Ref Range Status   Specimen Description   Final    BLOOD BLOOD LEFT ARM Performed at [facility], [address], [place], [place] [address]    Special Requests   Final    AEROBIC BOTTLE ONLY Blood Culture adequate volume Performed at [facility], [address], [place], [place] [address]    Culture  Setup Time   Final    GRAM NEGATIVE RODS AEROBIC BOTTLE ONLY CRITICAL RESULT CALLED TO, READ BACK BY AND VERIFIED WITH: PHARMD N. GLOGOVAC 03/27/23 @ 0625 BY AB Performed at [facility], [address], [place], [place] [address]    Culture ESCHERICHIA COLI (A)  Final   Report Status 03/29/2023 FINAL  Final   Organism ID, Bacteria ESCHERICHIA COLI  Final      Susceptibility   Escherichia coli - MIC*    AMPICILLIN >=32 RESISTANT Resistant     CEFEPIME <=0.12 SENSITIVE Sensitive     CEFTAZIDIME <=1 SENSITIVE Sensitive     CEFTRIAXONE <=0.25 SENSITIVE Sensitive     CIPROFLOXACIN <=0.25 SENSITIVE Sensitive     GENTAMICIN <=1 SENSITIVE Sensitive     IMIPENEM <=0.25 SENSITIVE Sensitive     TRIMETH/SULFA >=320 RESISTANT Resistant     AMPICILLIN/SULBACTAM 8 SENSITIVE Sensitive     PIP/TAZO <=4 SENSITIVE Sensitive     * ESCHERICHIA COLI  Blood Culture ID Panel (Reflexed)     Status: Abnormal   Collection Time: 03/26/23  2:36 PM  Result Value Ref Range Status   Enterococcus faecalis NOT DETECTED NOT DETECTED Final   Enterococcus Faecium NOT DETECTED NOT DETECTED Final   Listeria monocytogenes NOT DETECTED NOT DETECTED Final   Staphylococcus species NOT DETECTED NOT DETECTED Final   Staphylococcus aureus (BCID) NOT DETECTED NOT DETECTED Final   Staphylococcus epidermidis NOT  DETECTED NOT DETECTED Final   Staphylococcus lugdunensis NOT DETECTED NOT DETECTED Final   Streptococcus species NOT DETECTED NOT DETECTED Final   Streptococcus agalactiae NOT DETECTED NOT DETECTED Final   Streptococcus pneumoniae NOT DETECTED NOT DETECTED Final   Streptococcus pyogenes NOT DETECTED NOT DETECTED Final   A.calcoaceticus-baumannii NOT DETECTED NOT DETECTED Final   Bacteroides fragilis NOT DETECTED NOT DETECTED Final   Enterobacterales DETECTED (A) NOT DETECTED Final    Comment: Enterobacterales represent a large order of gram negative bacteria, not a single organism. CRITICAL RESULT CALLED TO, READ BACK BY AND VERIFIED WITH: PHARMD N. GLOGOVAC 03/27/23 @ 0625 BY AB    Enterobacter cloacae complex NOT DETECTED NOT DETECTED Final   Escherichia coli DETECTED (A) NOT DETECTED Final    Comment: CRITICAL RESULT CALLED TO, READ BACK BY AND VERIFIED WITH: PHARMD N. GLOGOVAC 03/27/23 @ 0625 BY AB    Klebsiella aerogenes NOT DETECTED NOT DETECTED Final   Klebsiella oxytoca NOT DETECTED NOT DETECTED Final   Klebsiella pneumoniae NOT DETECTED NOT DETECTED Final   Proteus species NOT DETECTED NOT DETECTED Final   Salmonella species NOT DETECTED  NOT DETECTED Final   Serratia marcescens NOT DETECTED NOT DETECTED Final   Haemophilus influenzae NOT DETECTED NOT DETECTED Final   Neisseria meningitidis NOT DETECTED NOT DETECTED Final   Pseudomonas aeruginosa NOT DETECTED NOT DETECTED Final   Stenotrophomonas maltophilia NOT DETECTED NOT DETECTED Final   Candida albicans NOT DETECTED NOT DETECTED Final   Candida auris NOT DETECTED NOT DETECTED Final   Candida glabrata NOT DETECTED NOT DETECTED Final   Candida krusei NOT DETECTED NOT DETECTED Final   Candida parapsilosis NOT DETECTED NOT DETECTED Final   Candida tropicalis NOT DETECTED NOT DETECTED Final   Cryptococcus neoformans/gattii NOT DETECTED NOT DETECTED Final   CTX-M ESBL NOT DETECTED NOT DETECTED Final   Carbapenem resistance  IMP NOT DETECTED NOT DETECTED Final   Carbapenem resistance KPC NOT DETECTED NOT DETECTED Final   Carbapenem resistance NDM NOT DETECTED NOT DETECTED Final   Carbapenem resist OXA 48 LIKE NOT DETECTED NOT DETECTED Final   Carbapenem resistance VIM NOT DETECTED NOT DETECTED Final    Comment: Performed at [facility], [address] [place], [place] [address]  Culture, blood (Routine X 2) w Reflex to ID Panel     Status: None (Preliminary result)   Collection Time: 03/26/23  4:52 PM   Specimen: BLOOD  Result Value Ref Range Status   Specimen Description   Final    BLOOD BLOOD LEFT ARM Performed at [facility], [address] [place], [place] [address]    Special Requests   Final    AEROBIC BOTTLE ONLY Blood Culture adequate volume Performed at [facility], [address] [place], [place] [address]    Culture   Final    NO GROWTH 3 DAYS Performed at [facility], [address], [place], [place] [address]    Report Status PENDING  Incomplete     Radiology Studies: No results found.  Scheduled Meds:  Chlorhexidine Gluconate Cloth  6 each Topical Daily   feeding supplement  237 mL Oral BID BM   levothyroxine  100 mcg Oral Q0600   metoprolol tartrate  12.5 mg Oral BID   mometasone-formoterol  2 puff Inhalation BID   pantoprazole (PROTONIX) IV  40 mg Intravenous Q12H   sodium chloride flush  3 mL Intravenous Q12H   Continuous Infusions:  amiodarone 30 mg/hr (03/29/23 1000)   cefTRIAXone (ROCEPHIN)  IV Stopped (03/29/23 1000)   potassium chloride 10 mEq (03/29/23 1118)     LOS: 4 days   Burnadette Pop, MD [facility] P4/24/2024, 11:41 AM

## 2023-03-29 NOTE — Anesthesia Postprocedure Evaluation (Signed)
Anesthesia Post Note  Patient: Charles Mckinney  Procedure(s) Performed: ESOPHAGOGASTRODUODENOSCOPY (EGD) WITH PROPOFOL COLONOSCOPY WITH PROPOFOL BIOPSY POLYPECTOMY SUBMUCOSAL TATTOO INJECTION     Patient location during evaluation: PACU Anesthesia Type: MAC Level of consciousness: awake and alert Pain management: pain level controlled Vital Signs Assessment: post-procedure vital signs reviewed and stable Respiratory status: spontaneous breathing, nonlabored ventilation, respiratory function stable and patient connected to nasal cannula oxygen Cardiovascular status: stable and blood pressure returned to baseline Postop Assessment: no apparent nausea or vomiting Anesthetic complications: no   No notable events documented.  Last Vitals:  Vitals:   03/29/23 0746 03/29/23 0921  BP:  122/76  Pulse:  (!) 126  Resp:    Temp:    SpO2: 98%     Last Pain:  Vitals:   03/29/23 0949  TempSrc:   PainSc: 7                  Kennieth Rad

## 2023-03-29 NOTE — Consult Note (Addendum)
Charles Mckinney 1951/04/17  [id number].    Requesting MD: Dr. Burnadette Pop Chief Complaint/Reason for Consult: colon mass noted on colonoscopy  HPI:  This is a 72 yo white male with a significant PMH including SNF placement, bed bound at baseline, HTN, COPD, O2 dependent, CKD, a fib on Eliquis, combined systolic/diastolic HF, EF 30-35% in 2019, OSA, AAA, who states that he has been having some blood per rectum.  He is unable to give me much information regarding this.  It is unclear how long this has been going on.  He thinks he has had some unintentional weight loss, but also states he hasn't been eating great.  He denies N/V.  He was brought to the [facility] for evaluation for a GI bleed.  His Eliquis has been held and GI was consulted who performed upper and lower endos yesterday.  EGD revealed likely candidiasis of the esophagus as well as non-bleeding gastric ulcers with clean bases.  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  This has been biopsied and path is pending along with CEA and CT CAP for staging purposes.  We have been asked to see for further recommendations.  ROS: ROS: please see HPI, otherwise limited as patient isn't a great historian  Family History  Problem Relation Age of Onset   Diabetes Mother    Heart disease Father    Congestive Heart Failure Sister    Stroke Brother    Heart disease Brother    Diabetes Brother     Past Medical History:  Diagnosis Date   AAA (abdominal aortic aneurysm) without rupture 03/14/2019   Anxiety    CAD, multiple vessel 04/2015   Right and Left Heart Catheterization Apr 09, 2015: LM 95%; ostLAD 99% - p-dLAD 80%, D1 99% - D2 80%; OCx 90%,pCx 50%; pRCA 80%, mRCA 50%, rPDA 80% & 90%. --> CABG x 4: LIMA-LAD, SVG-Diag, SeqSVG-rPL-rPDA --> Intra-Op TEE showed EF of 30% with only mild MR.    Chronic combined systolic and diastolic CHF, NYHA class 3  01/19/2015   Chronic combined systolic and diastolic heart failure, NYHA class 3 01/2014   EF is been ranging from 30-35% since February 2015.  Suggestion of anterior infarct on Myoview/echo.   CKD (chronic kidney disease), stage III 05/29/2017   Coronary artery disease involving native coronary artery of native heart with angina pectoris 02/02/2016   Cough    Essential hypertension 05/30/2017   GERD 11/22/2007   GERD (gastroesophageal reflux disease)    Gout 05/30/2017   Hyperlipidemia    Hyperlipidemia with target LDL less than 70    Hypertension    Hypertensive heart disease    Mild to moderately dilated LV with mild LVH.   Hypothyroidism due to medication    Ischemic cardiomyopathy 01/2014   EF 30-35%.  Severe multivessel CAD noted on cath--CABG x4 -> Myoview (June 2018) shows large anterior-septal-apical infarct with no ischemia.   Kidney stones    Long term current use of anticoagulant therapy 01/19/2015   Morbid obesity    Obesity (BMI 30-39.9)    OSA (obstructive sleep apnea) 10/04/2017   Osteoarthritis    Permanent atrial fibrillation    On Amiodarone & Beta Blocker (on Xarelto)   Persistent atrial fibrillation (HCC): CHA2DS2Vasc 6    Cardioversion 01/19/15 and amiodarone begun  This patients CHA2DS2-VASc Score and unadjusted Ischemic Stroke Rate (% per year) is equal to 9.7 % stroke  rate/year from a score of 6  Above score calculated as 1 point each if present [CHF, HTN, DM, Vascular=MI/PAD/Aortic Plaque, Age if 65-74, or Male] Above score calculated as 2 points each if present [Age > 75, or Stroke/TIA/TE]    PONV (postoperative nausea and vomiting)    Prolonged QT interval 04/26/2017   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   CARDIOVERSION N/A 01/19/2015   Procedure: CARDIOVERSION;  Surgeon: Othella Boyer, MD;  Location: [facility] ENDOSCOPY;  Service: Cardiovascular;  Laterality: N/A;  pt in Afib, 12:22 synched cardioversion @  120 joules using Propofol 100 mg,IV....unsuccessful, repeated at 200 joules   CARDIOVERSION N/A 04/02/2015   Procedure: CARDIOVERSION;  Surgeon: Orpah Cobb, MD;  Location: [facility] ENDOSCOPY;  Service: Cardiovascular;  Laterality: N/A;   CATARACT EXTRACTION     CLIPPING OF ATRIAL APPENDAGE  04/13/2015   Procedure: CLIPPING OF ATRIAL APPENDAGE;  Surgeon: Alleen Borne, MD;  Location: [facility] OR;  Service: Open Heart Surgery;;   CORONARY ARTERY BYPASS GRAFT N/A 04/13/2015   Procedure: CORONARY ARTERY BYPASS GRAFTING (CABG);  Surgeon: Alleen Borne, MD;  Location: [facility] OR;  Service: Open Heart Surgery;; LIMA-LAD, SVG-Diag, SeqSVG-rPL-rPDA    NM MYOVIEW LTD  05/2017   EF 27%.  No reversible ischemia.  Large anterior-apical and septal infarct.  Diffuse LV HK and moderate LV dilation.   TEE WITHOUT CARDIOVERSION N/A 04/13/2015   Procedure: TRANSESOPHAGEAL ECHOCARDIOGRAM (TEE);  Surgeon: Alleen Borne, MD;  Location: [facility] OR;  Service: Open Heart Surgery;  Laterality: N/A;   TRANSTHORACIC ECHOCARDIOGRAM  2/'15, 2/'16, 4/'16   a) EF 30-35%.  Mild LVH.  GRII DD.  Mild LA dilation.  Moderate diastolic dysfunction. b) Technically difficult.  Mild LVH.  EF 35%.  Diffuse HK.  Moderate LA dilation.  Mildly dilated RV with mildly reduced function.  c)   Mild concentric LVH.  EF 30-35%, diffuse HK.  Severe HK of mid apical inferior wall.  Moderate LA dilation.   TRANSTHORACIC ECHOCARDIOGRAM N/A 1/18, 5/18, 7/18   a) Moderate severely reduced LV.  EF 30 of 35% with diffuse HK.  Mild MR.  Mildly dilated RA.;; b) (In setting of TIA).  Technically difficult mild to moderate globally reduced LV function.  Mild LVH.  Mild MR.  EF estimated  40-45%.  Moderate LA dilation.;; c)  Afib. Mild to mod dilated LV with mild LVH. EF 30-35% with diffuse HK. Mildly dilated RV with mildly decrease Systolic Fxn. Mild MR    Social History:  reports that he quit smoking about 24 years ago. He has never used smokeless tobacco. He reports that he does not drink alcohol and does not use drugs.  Allergies:  Allergies  Allergen Reactions   Niacin And Related Other (See Comments)    FLUSHING   Xarelto [Rivaroxaban] Other (See Comments)    Cause bloody stools. Switched to eliquis low dose due to bleeding.    Penicillins Itching, Rash and Other (See Comments)    Has patient had a PCN reaction causing immediate rash, facial/tongue/throat swelling, SOB or lightheadedness with hypotension: Yes Has patient had a PCN  reaction causing severe rash involving mucus membranes or skin necrosis: No Has patient had a PCN reaction that required hospitalization: No Has patient had a PCN reaction occurring within the last 10 years: No If all of the above answers are "NO", then may proceed with Cephalosporin use.     Medications Prior to Admission  Medication Sig Dispense Refill   acetaminophen (TYLENOL) 325 MG tablet Take 650 mg by mouth every 6 (six) hours as needed (for pain).     acetaminophen (TYLENOL) 500 MG tablet Take 1,000 mg by mouth in the morning and at bedtime.     albuterol (PROAIR HFA) 108 (90 Base) MCG/ACT inhaler INHALE 1 PUFF INTO THE LUNGS EVERY 6 HOURS AS NEEDED FOR WHEEZING OR SHORTNESS OF BREATH (Patient taking differently: Inhale 1 puff into the lungs every 6 (six) hours as needed for wheezing or shortness of breath.) 8.5 g 3   albuterol (PROVENTIL) (2.5 MG/3ML) 0.083% nebulizer solution USE ONE VIAL USING NEBULIZER EVERY 6 (SIX) HOURS AS NEEDED FOR WHEEZING OR SHORTNESS OF BREATH. (Patient taking differently: Take 2.5 mg by nebulization every 6 (six) hours as needed for wheezing or shortness of breath.) 150 mL 1   allopurinol (ZYLOPRIM) 300  MG tablet Take 300 mg by mouth in the morning.     apixaban (ELIQUIS) 5 MG TABS tablet Take 1 tablet (5 mg total) by mouth 2 (two) times daily. 60 tablet 0   ASPERCREME LIDOCAINE 4 % Place 1 patch onto the skin See admin instructions. Apply 1 patch to the right and left hips once a day (12 hours on/12 hours off)     ASPERCREME ORIGINAL 10 % cream Apply 1 Application topically every 4 (four) hours as needed for muscle pain (LEFT TOES).     Benzocaine-Menthol (CEPACOL MT) 1 lozenge See admin instructions. Dissove 1 lozenge in the mouth every four hours as needed for soreness     bumetanide (BUMEX) 2 MG tablet Take 2-4 mg by mouth See admin instructions. Take 4 mg by mouth at 5 PM daily and 2 mg once a day as needed for an overnight weight gain of 3 pounds or 5 pounds in one week     Carboxymethylcellulose Sodium (ARTIFICIAL TEARS OP) Place 1 drop into both eyes every 6 (six) hours as needed (for irritation).     Cholecalciferol (VITAMIN D3) 125 MCG (5000 UT) CAPS Take 5,000 Units by mouth daily.     DESITIN 40 % PSTE Apply 1 Application topically See admin instructions. Apply to buttocks 2 times a day     ferrous sulfate 325 (65 FE) MG tablet Take 325 mg by mouth 2 (two) times daily with a meal.     fluticasone (FLONASE) 50 MCG/ACT nasal spray Place 1 spray into both nostrils daily.     gabapentin (NEURONTIN) 300 MG capsule Take 300 mg by mouth in the morning and at bedtime.     hydrocortisone 1 % lotion Apply 1 Application topically 3 (three) times daily as needed for itching (LOWER EXTREMETIES).     Infant Care Products [facility]) OINT Apply 1 application  topically See admin instructions. Apply to buttocks 2 times a day     Lactobacillus (ACIDOPHILUS) 100 MG CAPS Take 100 mg by mouth daily.     levothyroxine (SYNTHROID) 50 MCG tablet Take 50 mcg by mouth daily before breakfast.     loperamide (IMODIUM A-D) 2 MG tablet Take 2 mg by mouth every 8 (eight) hours as needed (for loose stools).  Menthol, Topical Analgesic, (BIOFREEZE) 4 % GEL Apply 1 application  topically at bedtime as needed (for lower back pain).     metolazone (ZAROXOLYN) 2.5 MG tablet Take 2.5 mg by mouth every Monday, Wednesday, and Friday.     metoprolol succinate (TOPROL-XL) 25 MG 24 hr tablet Take 12.5 mg by mouth in the morning and at bedtime.     nitroGLYCERIN (NITROSTAT) 0.4 MG SL tablet Place 0.4 mg under the tongue every 5 (five) minutes as needed for chest pain.     OXYGEN Inhale 2 L/min into the lungs See admin instructions. 2 L/min to MAINTAIN SATS ABOVE 90% AND as needed for shortness of breath at bedtime     pantoprazole (PROTONIX) 40 MG tablet Take 40 mg by mouth daily before breakfast.     potassium chloride SA (K-DUR) 20 MEQ tablet Take 40 mEq by mouth daily.     pravastatin (PRAVACHOL) 40 MG tablet TAKE ONE TABLET BY MOUTH ONCE DAILY (Patient taking differently: Take 40 mg by mouth at bedtime.) 90 tablet 1   Sertraline HCl 200 MG CAPS Take 200 mg by mouth in the morning.     Skin Protectants, Misc. (MINERIN CREME) CREA Apply 1 Application topically See admin instructions. Apply to both lower legs once a day     SPIRIVA HANDIHALER 18 MCG inhalation capsule Place 36 mcg into inhaler and inhale daily.     sucralfate (CARAFATE) 1 g tablet Take 1 g by mouth 4 (four) times daily.     traMADol (ULTRAM) 50 MG tablet Take 50 mg by mouth in the morning and at bedtime.     traZODone (DESYREL) 50 MG tablet Take 50 mg by mouth at bedtime.     WIXELA INHUB 250-50 MCG/ACT AEPB Inhale 1 puff into the lungs See admin instructions. Inhale 1 puff into the lungs every twelve hours- rinse and spit after each use     ZOFRAN 4 MG tablet Take 4 mg by mouth every 6 (six) hours as needed for nausea.       Physical Exam: Blood pressure 120/60, pulse (!) 53, temperature 97.8 F (36.6 C), temperature source Oral, resp. rate 18, height 5\' 10"  (1.778 m), weight 127.8 kg, SpO2 98 %. General: pleasant, obese, and chronically  ill-appearing white male who is laying in bed in NAD HEENT: head is normocephalic, atraumatic.  Sclera are noninjected.  PERRL.  Ears and nose without any masses or lesions.  Mouth is pink and moist Heart: irregular, no murmurs noted.   Lungs: CTAB, no wheezes, rhonchi, or rales noted.  Respiratory effort nonlabored on O2 supplementation via [place] Abd: soft, NT, ND, +BS, no masses or organomegaly.  Umbilical hernia that is soft and reducible and feels like it may have some swiss cheese defects. MS: all 4 extremities are symmetrical with no cyanosis, clubbing, but he does have some trace edema of his BLE. Skin: warm and dry with no masses, lesions, or rashes.  He has chronic venous stasis changes of his BLE with acute erythematous changes to R>L leg. Psych: A&Ox3 with an appropriate affect, but is not a great historian   Results for orders placed or performed during the hospital encounter of 03/24/23 (from the past 48 hour(s))  Potassium     Status: Abnormal   Collection Time: 03/27/23  5:34 PM  Result Value Ref Range   Potassium 2.8 (L) 3.5 - 5.1 mmol/L    Comment: Performed at [facility], [address], [place], [place] [address]  CBC     Status: Abnormal   Collection Time: 03/28/23  3:26 AM  Result Value Ref Range   WBC 10.4 4.0 - 10.5 K/uL   RBC 3.58 (L) 4.22 - 5.81 MIL/uL   Hemoglobin 9.1 (L) 13.0 - 17.0 g/dL   HCT 16.1 (L) 09.6 - 04.5 %   MCV 89.9 80.0 - 100.0 fL   MCH 25.4 (L) 26.0 - 34.0 pg   MCHC 28.3 (L) 30.0 - 36.0 g/dL   RDW 40.9 (H) 81.1 - 91.4 %   Platelets 467 (H) 150 - 400 K/uL   nRBC 0.0 0.0 - 0.2 %    Comment: Performed at [facility], [address], [place], [place] [address]  Comprehensive metabolic panel     Status: Abnormal   Collection Time: 03/28/23  3:26 AM  Result Value Ref Range   Sodium 136 135 - 145 mmol/L   Potassium 3.2 (L) 3.5 - 5.1 mmol/L   Chloride 98 98 - 111 mmol/L   CO2 25 22 - 32 mmol/L   Glucose, Bld  112 (H) 70 - 99 mg/dL    Comment: Glucose reference range applies only to samples taken after fasting for at least 8 hours.   BUN 26 (H) 8 - 23 mg/dL   Creatinine, Ser 6.21 0.61 - 1.24 mg/dL   Calcium 8.2 (L) 8.9 - 10.3 mg/dL   Total Protein 5.7 (L) 6.5 - 8.1 g/dL   Albumin 2.2 (L) 3.5 - 5.0 g/dL   AST 22 15 - 41 U/L   ALT 22 0 - 44 U/L   Alkaline Phosphatase 260 (H) 38 - 126 U/L   Total Bilirubin 0.6 0.3 - 1.2 mg/dL   GFR, Estimated >30 >86 mL/min    Comment: (NOTE) Calculated using the CKD-EPI Creatinine Equation (2021)    Anion gap 13 5 - 15    Comment: Performed at [facility], [address] [place], [place] [address]  CBC     Status: Abnormal   Collection Time: 03/29/23  2:55 AM  Result Value Ref Range   WBC 9.8 4.0 - 10.5 K/uL   RBC 3.30 (L) 4.22 - 5.81 MIL/uL   Hemoglobin 8.4 (L) 13.0 - 17.0 g/dL   HCT 96.2 (L) 95.2 - 84.1 %   MCV 90.3 80.0 - 100.0 fL   MCH 25.5 (L) 26.0 - 34.0 pg   MCHC 28.2 (L) 30.0 - 36.0 g/dL   RDW 32.4 (H) 40.1 - 02.7 %   Platelets 448 (H) 150 - 400 K/uL   nRBC 0.0 0.0 - 0.2 %    Comment: Performed at [facility], [address], [place], [place] [address]  Basic metabolic panel     Status: Abnormal   Collection Time: 03/29/23  2:55 AM  Result Value Ref Range   Sodium 133 (L) 135 - 145 mmol/L   Potassium 2.9 (L) 3.5 - 5.1 mmol/L   Chloride 101 98 - 111 mmol/L   CO2 22 22 - 32 mmol/L   Glucose, Bld 123 (H) 70 - 99 mg/dL    Comment: Glucose reference range applies only to samples taken after fasting for at least 8 hours.   BUN 20 8 - 23 mg/dL   Creatinine, Ser 4.40 0.61 - 1.24 mg/dL   Calcium 7.9 (L) 8.9 - 10.3 mg/dL   GFR, Estimated >34 >74 mL/min    Comment: (NOTE) Calculated using the CKD-EPI Creatinine Equation (2021)    Anion gap 10 5 -  15    Comment: Performed at [facility], [address] [place], [place] [address]   No results found.    Assessment/Plan Partially  obstructing recto-sigmoid colon mass, likely malignant The patient has been seen, examined, chart, labs, vitals, procedures, and pertinent imaging reviewed by myself.  The patient is chronically ill with multiple medical problems and is at baseline bedbound and poor functional status.  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  A CEA and CT CAP are pending as well as c-scope biopsies.  This was all discussed with the patient who stated that he had not been made aware of these findings until just now.  Unclear if he was told about his results yesterday or not.  It is possible we could offer a Hartmann's procedure for the patient so that this mass could be removed and he could be diverted.  This would help with the bleeding aspect of the tumor.  Unfortunately, he is a very high-risk operative candidate.  Please see ACS risk calculator below for risk stratification.  I discussed some of this with the patient today.  His son, Cha, is his POA and not currently present.  He also has another son who, I believe, may help Yaniv in decision making as well.  Prior to decision making, I would like to get the CT CAP to determine if he has metastatic disease, which may affect decision making as well.  Once this has returned, I think it would be reasonable to have a meeting with the patient, his sons, palliative care, and our service to determine surgical vs non-surgical options.  I have spoken to Dr. Patterson Hammersmith of palliative care services, and she is in agreement with this plan.  If we ultimately pursue surgical intervention, we would recommend cardiology consult for pre-op eval as well.  His last echo was 5 years ago and he may benefit from a new one to assure his EF has not worsened.  Will defer to medical/cards team etc.  We will continue to follow.     FEN - FLD VTE - on hold due to GI bleed, home Eliquis on hold as well ID - Rocephin for CAP  Bedbound state HTN COPD, chronic hypoxic  respiratory failure on chronic O2 CKD A. Fib on eliquis (on hold) Systolic/diastolic CHF OSA AAA CAP   I reviewed Consultant GI, palliative care notes, hospitalist notes, last 24 h vitals and pain scores, last 48 h intake and output, last 24 h labs and trends, and last 24 h imaging results.  Letha Cape, [facility] 03/29/2023, 8:40 AM Please see Amion for pager number during day hours 7:00am-4:30pm or 7:00am -11:30am on weekends

## 2023-03-29 NOTE — Progress Notes (Signed)
Patient continues to decline nocturnal CPAP. RT will continue to follow and encourage use.

## 2023-03-29 NOTE — Progress Notes (Signed)
Daily Progress Note   Patient Name: Charles Mckinney       Date: 03/29/2023 DOB: [birth year]  Age: 72 y.o. MRN#: [id number] Attending Physician: Burnadette Pop, MD Primary Care Physician: Virl Cagey, MD Admit Date: 03/24/2023 Length of Stay: 4 days  Reason for Consultation/Follow-up: Establishing goals of care  Subjective:   CC: Patient sleeping in bed. No family present at bedside during visit. Following up regarding complex medical decision making.   Subjective:  Reviewed EMR prior to presenting to bedside.  Patient had colonoscopy yesterday which showed colon mass.  Biopsy showing fragments of adenoma with high-grade dysplasia and foci suspicious for adenocarcinoma.  Further imaging workup including CT of chest abdomen and pelvis has been ordered to assist in determining possible pathways for medical care moving forward.  Discussed care with surgical PA, Charles Mckinney.  Agreeing with the need for family meeting with 2 sons present along with surgery team and palliative medicine team to discuss care moving forward.  Based on ACS risk calculator, patient is a very high risk operative candidate with a poor functional status.  Awaiting further imaging to assist in conversations with family to help in determining medical pathway moving forward. Surgery hoping to reach out to family to assist with family being once further information obtained for discussions.  Presented to bedside to see patient today.  Patient seen sleeping comfortably in bed.  No family present at bedside.  Allowed patient to rest at this time as family would need to be present for complex discussions with patient having episodes of delirium. Did discuss care with bedside RN for medical updates.  Noted palliative medicine team would continue to follow along and participate in family meeting when scheduled.  Objective:   Vital Signs:  BP 107/68   Pulse 89   Temp 98 F (36.7 C) (Oral)   Resp (!) 24   Ht   (1.778 m)   Wt 127.8 kg   SpO2 98%   BMI 40.43 kg/m   Physical Exam: General: NAD, laying in bed, sleeping Eyes: conjunctiva clear, anicteric sclera Cardiovascular: tachycardia noted Respiratory: no increased work of breathing noted, not in respiratory distress, on [place] O2 Abdomen: obese Skin: no rashes or lesions on visible skin Neuro: sleeping   Imaging:  I personally reviewed recent imaging.   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 Since admission, GI has been consulted for workup of acute lower GI bleed. Patient also noted to have severe sepsis secondary to gram-negative bacteremia for which she is currently receiving antibiotics and is on pressor support. Palliative medicine team consulted to assist with complex medical decision making.   Recommendations/Plan: # Complex medical decision making/goals of care:  - Patient sleeping today so did not awaken as patient still reported to have delirium with waxing and waning medical status. No family present at bedside today during visit. Discussing care with care providers to hopefully have coordination of family meeting with two sons. With patient's new diagnosis of colon cancer, he is at very high risk for interventions due to his poor functional status with him being bed bound. PMT provider will plan to participate in family meeting if able.   -  Code Status: Full Code  # Symptom management: Non-pharmacologic Delirium Precautions  - Frequent re-orientation; update board w/ date, names of treatment team  - Daytime stimulation: Open curtains, turn lights on, and turn on television  as tolerated during waking hours  - Nighttime calm - close curtains, turn lights off, and turn off television at 9pm  with minimal interruptions from treatment team during sleeping hours, including avoiding lab draws if possible  - Encourage continued presence of family/friends  - Occupy w/ distractions - e.g. ask them to fold washcloths, busy-board  - Encourage movement with PT/OT as tolerated  - Ensure adequate sensorium, to include providing glasses, dentures, and hearing aids to patient as appropriate  - Ensure adequate nutrition and fluid intake  - Avoid restraints whenever possible, but "safety first" . If restraints are necessary, please monitor CPK and BUN/Cr  - Assess and treat pain (incl nonverbal cues); e.g. consider scheduled tylenol  - Assess and treat constipation and urinary retention  - Ensure hydration, electrolyte balance (K to 4.0, Mg to 2.0), adequate oxygenation  - Review medications (addition, deletion, changes can trigger)  - If possible, avoid "high-risk" meds such as anticholinergics, BZ, steroids, antidepressants    # Psycho-social/Spiritual Support:  - Support System: 2 sons, ex-wife  # Discharge Planning: TBD  Discussed with: bedside RN, surgery team   Thank you for allowing the palliative care team to participate in the care Charles Mckinney.  Charles Morin, DO Palliative Care Provider PMT # [phone]  This provider spent a total of 25 minutes providing patient's care.  Includes review of EMR, discussing care with other staff members involved in patient's medical care, obtaining relevant history and information from patient and/or patient's family, and personal review of imaging and lab work. Greater than 50% of the time was spent counseling and coordinating care related to the above assessment and plan.    *Please note that this is a verbal dictation therefore any spelling or grammatical errors are due to the "[employer]agon [employer] One" system interpretation.

## 2023-03-29 NOTE — Progress Notes (Signed)
Patients son Charles Mckinney arrived at [id number]. I was able to read the results of the patients CT-scan. I informed son that some of the interdisciplinary team would like to meet with him tomorrow to discuss care going forward and medical options.   Charles Mckinney requested to be notified early morning so he could plan his work day around a meeting. Please reach Charles Mckinney at [phone]

## 2023-03-29 NOTE — Progress Notes (Addendum)
Rounding Note    Patient Name: Charles Mckinney Date of Encounter: 03/29/2023  [facility] Cardiologist: Bryan Lemma, MD   Subjective   No acute overnight events. Patient states he is doing bad this morning and that everything hurts. He does report some chest discomfort that he describes as an ache and ranks as a 4/10 on the pain scale but he does not appear to be in any acute distress. He states this is the same aching sensation that he feels everywhere. Does not sound like angina or cardiac chest pain. He has chronic shortness of breath but states this is unchanged from his baseline. He denies any palpitations. He is very discouraged about the results from his colonoscopy yesterday which showed multiple polyps in his colon as well as a mass that is likely malignant.   Inpatient Medications    Scheduled Meds:  Chlorhexidine Gluconate Cloth  6 each Topical Daily   furosemide  40 mg Intravenous Once   iohexol  500 mL Oral Q1H   levothyroxine  100 mcg Oral Q0600   metoprolol tartrate  12.5 mg Oral BID   mometasone-formoterol  2 puff Inhalation BID   pantoprazole (PROTONIX) IV  40 mg Intravenous Q12H   sodium chloride flush  3 mL Intravenous Q12H   Continuous Infusions:  amiodarone 30 mg/hr (03/29/23 0600)   cefTRIAXone (ROCEPHIN)  IV Stopped (03/28/23 1478)   potassium chloride 10 mEq (03/29/23 0813)   PRN Meds: acetaminophen **OR** acetaminophen, albuterol, HYDROmorphone (DILAUDID) injection, ondansetron (ZOFRAN) IV, mouth rinse, oxyCODONE   Vital Signs    Vitals:   03/29/23 0500 03/29/23 0600 03/29/23 0705 03/29/23 0746  BP: (!) 106/52 120/60    Pulse: (!) 128 (!) 53    Resp: 20 18    Temp:   98.8 F (37.1 C)   TempSrc:   Axillary   SpO2: 97% 95%  98%  Weight:      Height:        Intake/Output Summary (Last 24 hours) at 03/29/2023 0912 Last data filed at 03/29/2023 0630 Gross per 24 hour  Intake 1173.51 ml  Output 950 ml  Net 223.51 ml       03/27/2023    4:49 AM 03/26/2023    5:00 AM 03/25/2023    3:15 PM  Last 3 Weights  Weight (lbs) 281 lb 12 oz 289 lb 3.9 oz 285 lb 4.4 oz  Weight (kg) 127.8 kg 131.2 kg 129.4 kg      Telemetry    Atrial fibrillation with occasional PVCs. Rates mostly in the 90s to 110s. - Personally Reviewed  ECG    No new ECG tracing today. - Personally Reviewed  Physical Exam   GEN: Morbidly obese Caucasian male. No acute distress.   Neck: JVD difficult to assess due to body habitus. Cardiac: Mildly tachycardic with irregularly irregular rate. No murmurs, rubs, or gallops. Radial pulses 2+ and equal bilaterally. Respiratory: On 2L of O2 via nasal cannula. No increased work of breathing. Decreased breath sounds throughout with possible very faint crackles noted in bilateral bases. GI: Soft, non-distended, and non-tender. MS: Trace edema of bilateral lower extremities. Erythema noted on bilateral lower extremities. No deformity. Skin: Warm and dry. Neuro:  No focal deficits. Psych: Flat affect. Responds appropriately.  Labs    High Sensitivity Troponin:  No results for input(s): "TROPONINIHS" in the last 720 hours.   Chemistry Recent Labs  Lab 03/25/23 0600 03/25/23 0859 03/25/23 0950 03/26/23 0731 03/27/23 0523 03/27/23 0524 03/27/23  1734 03/28/23 0326 03/29/23 0255  NA 123*  --    < > 135  --  137  --  136 133*  K 2.6*  --    < > 3.1*  --  2.6* 2.8* 3.2* 2.9*  CL 83*  --    < > 100  --  100  --  98 101  CO2 26  --    < > 26  --  26  --  25 22  GLUCOSE 406*  --    < > 121*  --  128*  --  112* 123*  BUN 49*  --    < > 42*  --  35*  --  26* 20  CREATININE 1.69*  --    < > 1.37*  --  1.11  --  1.24 1.12  CALCIUM 7.6*  --    < > 7.9*  --  8.0*  --  8.2* 7.9*  MG 9.7* 2.4  --   --  2.3  --   --   --   --   PROT 5.7*  --   --  5.3*  --  5.8*  --  5.7*  --   ALBUMIN 2.1*  --   --  1.7*  --  2.0*  --  2.2*  --   AST 35  --   --  29  --  23  --  22  --   ALT 28  --   --  26  --  23  --  22   --   ALKPHOS 366*  --   --  334*  --  289*  --  260*  --   BILITOT 0.7  --   --  0.7  --  0.6  --  0.6  --   GFRNONAA 43*  --    < > 55*  --  >60  --  >60 >60  ANIONGAP 14  --    < > 9  --  11  --  13 10   < > = values in this interval not displayed.    Lipids No results for input(s): "CHOL", "TRIG", "HDL", "LABVLDL", "LDLCALC", "CHOLHDL" in the last 168 hours.  Hematology Recent Labs  Lab 03/26/23 0731 03/26/23 1652 03/27/23 0524 03/28/23 0326 03/29/23 0255  WBC 11.8*  --   --  10.4 9.8  RBC 3.40*  --   --  3.58* 3.30*  HGB 8.5*   < > 8.9* 9.1* 8.4*  HCT 29.1*   < > 30.1* 32.2* 29.8*  MCV 85.6  --   --  89.9 90.3  MCH 25.0*  --   --  25.4* 25.5*  MCHC 29.2*  --   --  28.3* 28.2*  RDW 16.3*  --   --  16.8* 16.7*  PLT 388  --   --  467* 448*   < > = values in this interval not displayed.   Thyroid No results for input(s): "TSH", "FREET4" in the last 168 hours.  BNPNo results for input(s): "BNP", "PROBNP" in the last 168 hours.  DDimer No results for input(s): "DDIMER" in the last 168 hours.   Radiology    No results found.  Cardiac Studies   Echocardiogram 10/18/2018: Study Conclusions: - Procedure narrative: Technically difficult study with poor echo    windows. Definity contrast not given.  - Left ventricle: The cavity size was normal. Wall thickness was    increased in a pattern of  mild LVH. Systolic function was    moderately to severely reduced. The estimated ejection fraction    was in the range of 30% to 35%. Diffuse hypokinesis. The study is    not technically sufficient to allow evaluation of LV diastolic    function.  - Mitral valve: Mildly thickened leaflets . There was mild    regurgitation.  - Left atrium: The atrium was normal in size.  - Atrial septum: No defect or patent foramen ovale was identified.  - Inferior vena cava: The vessel was normal in size. The    respirophasic diameter changes were in the normal range (>= 50%),    consistent with  normal central venous pressure.   Impressions:  - Technically difficult study. Compared to a prior study in 2018,    the LVEF is unchanged at 30-35% wtih global hypokinesis.    Patient Profile     72 y.o. 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 He was was hypotensive on arrival and blood cultures were also positive for E.coli. He was transfused with PRBCs and GI was consulted. Cardiology was consulted on 03/27/2023 for atrial fibrillation with RVR.  Assessment & Plan    Permanent Atrial Fibrillation  Patient has a history of permanent atrial fibrillation. He was admitted with acute GI bleed and E.coli bacteremia and was noted to be in RVR. He was also severely hypokalemic on admission with potassium of 2.2. EF 30-35% on last Echo in 2019. He was started on IV Amiodarone given soft BP. - Rates in the 90s to low 110s. - Continue IV Amiodarone for now. - Continue Lopressor 12.5mg  twice daily. Rates reasonably well controlled so OK to continue this dose for now. BP soft at time but stable. If rates do not improve, can consider increasing to  twice daily or 12.5mg  every 6 hours. - CHA2DS2-VASc = 6 (CAD, CHF, TIA x2, HTN, age). On chronic anticoagulation with Eliquis at home but this was held on admission given GI bleed requiring blood transfusions. Restart Eliquis when okay with GI.  CAD s/p CABG History of CABG x4 in 2016. Myoview in 05/2017 showed a large infarct involving the anterior, apical, and  septal wall but no ischemia. - He reports some chest discomfort that he describes as a aching sensation. However, he states he hurts everywhere and that this is the same pain he has everywhere else. It does not sounds like cardiac chest pain but will repeat 12 lead EKG. - Not on aspirin at home given need for full anticoagulation. - Continue low dose beta-blocker.  - Home Pravastatin held on admission. Can resume at discharge.  Chronic Combined CHF Ischemic Cardiomyopathy Last Echo in 2019 showed LVEF of 30-35% with diffuse hypokinesis. Net positive 463 mL this admission. - Does not appear significantly volume overloaded on exam. Faint crackles noted in bases. - Primary team has ordered one dose of IV Lasix. - GDMT limited in the past due to CKD. However, renal function has now improved. GDMT now being limited more by soft BP.  - Continue Lopressor  as above. Can consolidate to Toprol-XL prior to discharge. - Will hold on additional GDMT to allow for more rate control. - Continue to monitor volume status closely.  Hypertension Patient has a history of hypertension but was hypotenisve on arrival with systolic BP in the 80s. BP has improved some but is still soft at time. - BP soft but stable. - Continue Lopressor as above.  Acute on CKD Stage III Baseline creatinine around 1.5 in 2020. Creatinine 1.90 on admission. This was in the setting of GI bleed and hypotension. Improved with IV fluids and blood transfusions.  - Creatinine 1.12 today. - Continue to monitor.  Hypokalemia Severely hypokalemic on admission with potassium of 2.2. He has received aggressive supplementation and potassium as waxed and waned. - Potassium 2.9 today (down from 3.2 yesterday). - Being aggressively repleted by primary team. - Continue to monitor closely.   GI Bleeding Acute Anemia Patient presented with blood in his adult depends for 1 week (but had been refusing transport to the ED). Hemoglobin 7.4 on  admission. S/p 2 units of PRBCs. 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 General surgery and Palliative Care have been consulted. - Hemoglobin 8.4 today (down from 9.1 yesterday).  - Continue PPI.  - Management per GI and General Surgery. Will defer to them on when it is OK to restart Eliquis. - Unclear on whether patient is a candidate for surgery right now with all of his other comorbidities. General Surgery consult pending. If surgery is recommended, he may need a Myoview and repeat Echo prior to surgery for further risk stratification given he is essentially wheelchair bound so unable to assess functional status and last Echo was in 2019. However, will wait for General Surgery's note before and discuss with MD before ordering these.  Otherwise, per primary team: - E.coli bacteremia  - COPD with chronic hypoxic respiratory failure - AAA - Hypothyroidism  - Obstructive sleep apnea - Elevated alkaline phosphatase - Hyperlipidemia - Deconditioning   For questions or updates, please contact [facility] Please consult [email or web address] for contact info under        Signed, Corrin Parker, PA-C  03/29/2023, 9:12 AM

## 2023-03-29 NOTE — Progress Notes (Signed)
Patient was given results of colonoscopy in details postprocedure along with procedure report with findings and pictures. Findings were also discussed with patient's son Charles Mckinney over the phone, [phone].  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 Pylori, PAS stain pending for Candida, 17 tubular adenomas were removed from sigmoid, descending, transverse, cecum and ascending.  I have ordered CEA and CT of the chest/abdomen and pelvis. Further management as per surgical team. Please recall GI if needed.

## 2023-03-30 ENCOUNTER — Inpatient Hospital Stay (HOSPITAL_COMMUNITY): Payer: No Typology Code available for payment source

## 2023-03-30 DIAGNOSIS — N179 Acute kidney failure, unspecified: Secondary | ICD-10-CM | POA: Diagnosis not present

## 2023-03-30 DIAGNOSIS — I82403 Acute embolism and thrombosis of unspecified deep veins of lower extremity, bilateral: Secondary | ICD-10-CM | POA: Diagnosis not present

## 2023-03-30 DIAGNOSIS — Z515 Encounter for palliative care: Secondary | ICD-10-CM | POA: Diagnosis not present

## 2023-03-30 DIAGNOSIS — I4821 Permanent atrial fibrillation: Secondary | ICD-10-CM | POA: Diagnosis not present

## 2023-03-30 DIAGNOSIS — D649 Anemia, unspecified: Secondary | ICD-10-CM | POA: Diagnosis not present

## 2023-03-30 DIAGNOSIS — R410 Disorientation, unspecified: Secondary | ICD-10-CM | POA: Diagnosis not present

## 2023-03-30 DIAGNOSIS — K922 Gastrointestinal hemorrhage, unspecified: Secondary | ICD-10-CM | POA: Diagnosis not present

## 2023-03-30 LAB — CEA: CEA: 23.2 ng/mL — ABNORMAL HIGH (ref 0.0–4.7)

## 2023-03-30 LAB — CBC
HCT: 33.3 % — ABNORMAL LOW (ref 39.0–52.0)
Hemoglobin: 9.5 g/dL — ABNORMAL LOW (ref 13.0–17.0)
MCH: 25.4 pg — ABNORMAL LOW (ref 26.0–34.0)
MCHC: 28.5 g/dL — ABNORMAL LOW (ref 30.0–36.0)
MCV: 89 fL (ref 80.0–100.0)
Platelets: 454 10*3/uL — ABNORMAL HIGH (ref 150–400)
RBC: 3.74 MIL/uL — ABNORMAL LOW (ref 4.22–5.81)
RDW: 16.5 % — ABNORMAL HIGH (ref 11.5–15.5)
WBC: 11.1 10*3/uL — ABNORMAL HIGH (ref 4.0–10.5)
nRBC: 0 % (ref 0.0–0.2)

## 2023-03-30 LAB — BASIC METABOLIC PANEL
Anion gap: 11 (ref 5–15)
BUN: 17 mg/dL (ref 8–23)
CO2: 22 mmol/L (ref 22–32)
Calcium: 8.2 mg/dL — ABNORMAL LOW (ref 8.9–10.3)
Chloride: 100 mmol/L (ref 98–111)
Creatinine, Ser: 1.23 mg/dL (ref 0.61–1.24)
GFR, Estimated: >60 mL/min (ref >60)
Glucose, Bld: 121 mg/dL — ABNORMAL HIGH (ref 70–99)
Potassium: 3.7 mmol/L (ref 3.5–5.1)
Sodium: 133 mmol/L — ABNORMAL LOW (ref 135–145)

## 2023-03-30 LAB — CULTURE, BLOOD (ROUTINE X 2)

## 2023-03-30 NOTE — Progress Notes (Signed)
Rounding Note    Patient Name: Charles Mckinney Date of Encounter: 03/30/2023  [facility] Cardiologist: Bryan Lemma, MD   Subjective   No acute overnight events. Patient remains in atrial fibrillation with rates mostly in the 90s to 110s (occasionally in the 120s to 140s). HR softer this morning with systolic BP in the 90s. No significant change in how patient is feeling. He has chronic shortness of breath due to his COPD. He states his breathing may be a little worse this morning but he just finished a breathing treatment and feels better with this. He continues to have diffuse aching all over his body including in his chest which is not new. He denies any palpitations.  Inpatient Medications    Scheduled Meds:  Chlorhexidine Gluconate Cloth  6 each Topical Daily   feeding supplement  237 mL Oral BID BM   levothyroxine  100 mcg Oral Q0600   metoprolol tartrate  12.5 mg Oral BID   mometasone-formoterol  2 puff Inhalation BID   pantoprazole (PROTONIX) IV  40 mg Intravenous Q12H   sodium chloride flush  3 mL Intravenous Q12H   Continuous Infusions:  amiodarone 30 mg/hr (03/30/23 0600)   cefTRIAXone (ROCEPHIN)  IV Stopped (03/29/23 1000)   PRN Meds: acetaminophen **OR** acetaminophen, albuterol, HYDROmorphone (DILAUDID) injection, ondansetron (ZOFRAN) IV, mouth rinse, oxyCODONE   Vital Signs    Vitals:   03/30/23 0434 03/30/23 0500 03/30/23 0600 03/30/23 0700  BP:  125/85 96/63 (!) 94/54  Pulse:  (!) 103 (!) 124 (!) 109  Resp:  (!) 23 (!) 21 (!) 27  Temp:      TempSrc:      SpO2:  100% 95% 92%  Weight: 132.9 kg     Height:        Intake/Output Summary (Last 24 hours) at 03/30/2023 1610 Last data filed at 03/30/2023 0600 Gross per 24 hour  Intake 2059.54 ml  Output 700 ml  Net 1359.54 ml      03/30/2023    4:34 AM 03/27/2023    4:49 AM 03/26/2023    5:00 AM  Last 3 Weights  Weight (lbs) 292 lb 15.9 oz 281 lb 12 oz 289 lb 3.9 oz  Weight (kg) 132.9 kg  127.8 kg 131.2 kg      Telemetry    Atrial fibrillation with rates mostly in the 90s to 110s (occasionally in the 120s to 140s). - Personally Reviewed  ECG    No new ECG tracing today. - Personally Reviewed  Physical Exam   GEN: Morbidly obese Caucasian male in no acute distress.   Neck: JVD difficult to assess due to body habitus. Cardiac: Mildly tachycardic with irregularly irregular rate. No murmurs, rubs, or gallops. Radial pulses 2+ and equal bilaterally. Respiratory: On 2L of O2 via nasal cannula. No increased work of breathing. Diminished breath sounds throughout. No wheezes, rhonchi, or rales appreciated.  GI: Soft, non-distended, and non-tender. MS: Trace edema of bilateral lower extremities. Erythema noted on bilateral lower extremities. No deformity. Skin: Warm and dry. Neuro:  No focal deficits. Psych: Flat affect. Responds appropriately.  Labs    High Sensitivity Troponin:  No results for input(s): "TROPONINIHS" in the last 720 hours.   Chemistry Recent Labs  Lab 03/25/23 0600 03/25/23 0859 03/25/23 0950 03/26/23 0731 03/27/23 0523 03/27/23 0524 03/27/23 1734 03/28/23 0326 03/29/23 0255 03/29/23 1553  NA 123*  --    < > 135  --  137  --  136 133*  --  K 2.6*  --    < > 3.1*  --  2.6*   < > 3.2* 2.9* 4.0  CL 83*  --    < > 100  --  100  --  98 101  --   CO2 26  --    < > 26  --  26  --  25 22  --   GLUCOSE 406*  --    < > 121*  --  128*  --  112* 123*  --   BUN 49*  --    < > 42*  --  35*  --  26* 20  --   CREATININE 1.69*  --    < > 1.37*  --  1.11  --  1.24 1.12  --   CALCIUM 7.6*  --    < > 7.9*  --  8.0*  --  8.2* 7.9*  --   MG 9.7* 2.4  --   --  2.3  --   --   --   --   --   PROT 5.7*  --   --  5.3*  --  5.8*  --  5.7*  --   --   ALBUMIN 2.1*  --   --  1.7*  --  2.0*  --  2.2*  --   --   AST 35  --   --  29  --  23  --  22  --   --   ALT 28  --   --  26  --  23  --  22  --   --   ALKPHOS 366*  --   --  334*  --  289*  --  260*  --   --   BILITOT  0.7  --   --  0.7  --  0.6  --  0.6  --   --   GFRNONAA 43*  --    < > 55*  --  >60  --  >60 >60  --   ANIONGAP 14  --    < > 9  --  11  --  13 10  --    < > = values in this interval not displayed.    Lipids No results for input(s): "CHOL", "TRIG", "HDL", "LABVLDL", "LDLCALC", "CHOLHDL" in the last 168 hours.  Hematology Recent Labs  Lab 03/26/23 0731 03/26/23 1652 03/27/23 0524 03/28/23 0326 03/29/23 0255  WBC 11.8*  --   --  10.4 9.8  RBC 3.40*  --   --  3.58* 3.30*  HGB 8.5*   < > 8.9* 9.1* 8.4*  HCT 29.1*   < > 30.1* 32.2* 29.8*  MCV 85.6  --   --  89.9 90.3  MCH 25.0*  --   --  25.4* 25.5*  MCHC 29.2*  --   --  28.3* 28.2*  RDW 16.3*  --   --  16.8* 16.7*  PLT 388  --   --  467* 448*   < > = values in this interval not displayed.   Thyroid No results for input(s): "TSH", "FREET4" in the last 168 hours.  BNPNo results for input(s): "BNP", "PROBNP" in the last 168 hours.  DDimer No results for input(s): "DDIMER" in the last 168 hours.   Radiology    CT CHEST ABDOMEN PELVIS W CONTRAST  Result Date: 03/29/2023 CLINICAL DATA:  Rectosigmoid mass found on colonoscopy suspicious for adenocarcinoma, staging evaluation EXAM: CT CHEST,  ABDOMEN, AND PELVIS WITH CONTRAST TECHNIQUE: Multidetector CT imaging of the chest, abdomen and pelvis was performed following the standard protocol during bolus administration of intravenous contrast. RADIATION DOSE REDUCTION: This exam was performed according to the departmental dose-optimization program which includes automated exposure control, adjustment of the mA and/or kV according to patient size and/or use of iterative reconstruction technique. CONTRAST:  OMNIPAQUE IOHEXOL 300 MG/ML  SOLN COMPARISON:  03/26/2023, 03/27/2023, 09/21/2015 FINDINGS: CT CHEST FINDINGS Cardiovascular: The heart is enlarged without pericardial effusion. No evidence of thoracic aortic aneurysm or dissection. Atherosclerosis of the aorta and native coronary vessels.  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 Mediastinum/Nodes: No enlarged mediastinal, hilar, or axillary lymph nodes. Thyroid gland, trachea, and esophagus demonstrate no significant findings. Lungs/Pleura: There are small bilateral pleural effusions, left greater than right. There is dense bilateral lower lobe consolidation, left greater than right, likely representing a combination of airspace disease and atelectasis. Patchy nodular ground-glass airspace disease is seen within the left upper lobe, likely inflammatory or infectious. No pneumothorax. 5 mm right upper lobe pulmonary nodule is identified on image 44/5, indeterminate in this patient with a history of colon cancer. Close attention on follow-up is recommended. No other solid pulmonary nodules are identified. Musculoskeletal: There are no acute or destructive bony lesions. Reconstructed images demonstrate no additional findings. CT ABDOMEN PELVIS FINDINGS Hepatobiliary: The liver is unremarkable without focal abnormality identified on this single phase exam. No biliary duct dilation. The gallbladder is decompressed, with calcified gallstones identified. No evidence of acute cholecystitis. Pancreas: Unremarkable. No pancreatic ductal dilatation or surrounding inflammatory changes. Spleen: Normal in size without focal abnormality. Adrenals/Urinary Tract: There are bilateral nonobstructing renal calculi. There are at least 4 distinct calculi on the right, largest measuring 18 mm. At least 3 distinct calculi are seen on the left, largest measuring 9 mm. There are simple appearing right renal cysts which do not require specific imaging follow-up. There is a small amount of gas within the bladder lumen, as well as within a midpole calyx on the right. This could reflect gas introduced during catheterization, though correlation with urinalysis is recommended to exclude superimposed infection. Bladder is decompressed, with nonspecific wall thickening. The adrenals are  unremarkable. Stomach/Bowel: Long segment annular mass of the mid sigmoid colon is identified, extending at least 9 cm in length reference image 107/3, corresponding to the reported colonoscopy findings. No pericolonic fat stranding in this region. There is no bowel obstruction or ileus. Diverticulosis of the distal colon without diverticulitis. Normal appendix right lower quadrant. Vascular/Lymphatic: Atherosclerosis of the aorta and its branches. There are no pathologic lymph nodes identified within the abdomen or pelvis. Numerous subcentimeter lymph nodes within the retroperitoneum are stable and nonspecific. Reproductive: Prostate is unremarkable. Other: No free fluid or free intraperitoneal gas. Fat containing umbilical hernia unchanged. No bowel herniation. Musculoskeletal: No acute or destructive bony lesions. Reconstructed images demonstrate no additional findings. IMPRESSION: 1. Long segment annular mass of the sigmoid colon compatible with neoplasm identified at colonoscopy. 2. Solitary right upper lobe 5 mm pulmonary nodule, nonspecific. This could be an atypical presentation for a solitary metastasis. Continued attention on follow-up is recommended. 3. Otherwise no evidence of intrathoracic, intra-abdominal, or intrapelvic metastases. 4. Dense areas of consolidation within the bilateral lower lobes, left greater than right, as well as patchy nodular airspace disease within the left upper lobe. Findings likely represent a combination of infection/inflammation and atelectasis. 5. Trace bilateral pleural effusions, left greater than right. 6. Cardiomegaly. 7. Colonic diverticulosis without diverticulitis. 8.  Bilateral nonobstructing renal calculi. 9. Small amount of gas within the bladder lumen and a midpole right renal calyx, likely representing gas introduced during catheterization. Please correlate with urinalysis to exclude superimposed infection. 10.  Aortic Atherosclerosis (ICD10-I70.0).  Electronically Signed   By: Sharlet Salina M.D.   On: 03/29/2023 15:52    Cardiac Studies   Echocardiogram 10/18/2018: Study Conclusions: - Procedure narrative: Technically difficult study with poor echo    windows. Definity contrast not given.  - Left ventricle: The cavity size was normal. Wall thickness was    increased in a pattern of mild LVH. Systolic function was    moderately to severely reduced. The estimated ejection fraction    was in the range of 30% to 35%. Diffuse hypokinesis. The study is    not technically sufficient to allow evaluation of LV diastolic    function.  - Mitral valve: Mildly thickened leaflets . There was mild    regurgitation.  - Left atrium: The atrium was normal in size.  - Atrial septum: No defect or patent foramen ovale was identified.  - Inferior vena cava: The vessel was normal in size. The    respirophasic diameter changes were in the normal range (>= 50%),    consistent with normal central venous pressure.   Impressions:  - Technically difficult study. Compared to a prior study in 2018,    the LVEF is unchanged at 30-35% wtih global hypokinesis.   Patient Profile     72 y.o. 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 He was was hypotensive on arrival and blood cultures were also positive for E.coli. He was transfused with PRBCs and GI was consulted. Cardiology was consulted on 03/27/2023 for atrial fibrillation with RVR.   Assessment & Plan    Permanent  Atrial Fibrillation  Patient has a history of permanent atrial fibrillation. He was admitted with acute GI bleed and E.coli bacteremia and was noted to be in RVR. He was also severely hypokalemic on admission with potassium of 2.2. EF 30-35% on last Echo in 2019. He was started on IV Amiodarone given soft BP. - Rates reasonably well controlled - mostly in the 90s to 110s (occasionally in the 120s to 140s). BP remains soft with systolic BP in the 90s this morning. - Continue IV Amiodarone for now. - Continue Lopressor 12.5mg  twice daily. Will continue current dose for now given soft BP.  If rates increase, can consider increasing to 25mg  twice daily or 12.5mg  every 6 hours if BP will allow.. - CHA2DS2-VASc = 6 (CAD, CHF, TIA x2, HTN, age). On chronic anticoagulation with Eliquis at home but this was held on admission given GI bleed requiring blood transfusions. Will defer when it is safe to trial Heparin to GI/ General Surgery.   CAD s/p CABG History of CABG x4 in 2016. Myoview in 05/2017 showed a large infarct involving the anterior, apical, and septal wall but no ischemia. - He describes diffuse aching everywhere including his chest but nothing that sounds like angina. - Not on aspirin at home given  need for full anticoagulation and now GI bleed. - Continue low dose beta-blocker.  - Home Pravastatin held on admission. Can resume at discharge.   Chronic Combined CHF Ischemic Cardiomyopathy Last Echo in 2019 showed LVEF of 30-35% with diffuse hypokinesis. He has been receiving PRN Lasix during admission - last dose yesterday. Net positive 1.8 L this admission.  - Does not appear significantly volume overloaded on exam. - Think we can hold on additional Lasix today. - GDMT limited in the past due to CKD. However, renal function has now improved. GDMT now being limited more by soft BP.  - Continue Lopressor as above. Can consolidate to Toprol-XL prior to discharge. - BP too soft for any additional  GDMT right now. - Continue to monitor volume status closely.   Hypertension Patient has a history of hypertension but was hypotenisve on arrival with systolic BP in the 80s.  - BP still soft but stable with systolic BP in the 90s this morning. - Continue Lopressor as above.   Acute on CKD Stage III Baseline creatinine around 1.5 in 2020. Creatinine 1.90 on admission. This was in the setting of GI bleed and hypotension. Improved with IV fluids and blood transfusions.  - Creatinine 1.12 yesterday. - Continue to monitor.   Hypokalemia Severely hypokalemic on admission with potassium of 2.2. He has received aggressive supplementation and potassium as waxed and waned. - Potassium 2.9 yesterday. Replete and 4.0 yesterday afternoon. Today's labs pending.  GI Bleeding Acute Anemia Patient presented with blood in his adult depends for 1 week (but had been refusing transport to the ED). Hemoglobin 7.4 on admission. S/p 2 units of PRBCs. 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 General surgery and Palliative Care have been consulted. - Hemoglobin 8.4 yesterday. Today's labs pending.  - Continue PPI.  - Management per GI and General Surgery. Will defer to them on when it is OK to restart Eliquis. - Unclear on whether patient is a candidate for surgery right now with all of his other comorbidities. Chest/ Abdominal/ Pelvic CT showed a solitary right upper lobe 5 mm pulmonary nodule that was non-specific but could be an atypical presentation for a solitary metastasis. Otherwise, no evidence of intrathoracic, intra-abdominal, or intrapelvic metastases.  General Surgery and Palliative Care are planning on having a  joint meeting with patient to discuss surgical vs non-surgical options.   Pre-Op Risk Assessment If surgery is recommended for removal of tumor, he is moderate risk per the Revised Cardiac Risk Index given history of CAD and CHF. However, this does not take into acount patient's other comorbidities such as his COPD with chronic hypoxic respiratory failure. He is bedbound with poor functional status. If decision is to pursue surgery, we can repeat an Echo to make sure he has not had any worsening of his EF or valvular disease. Typically, we would also recommend a nuclear stress test to rule out ischemia given his poor functional status. However, as Dr. Cristal Deer stated in note from 03/29/2023, the issue would be how would we mitigate any potential findings such as ischemia. He is not a candidate for a redo CABG, and with persistently low EF, unlikely that any stent would improve his function. Therefore, ischemic testing unlikely to change risk/management prior to surgery.    Otherwise, per  primary team: - E.coli bacteremia  - COPD with chronic hypoxic respiratory failure - AAA - Hypothyroidism  - Obstructive sleep apnea - Elevated alkaline phosphatase - Hyperlipidemia - Deconditioning  For questions or updates, please contact [facility] Please consult [email or web address] for contact info under        Signed, Corrin Parker, PA-C  03/30/2023, 7:12 AM

## 2023-03-30 NOTE — Progress Notes (Signed)
Left upper extremity venous duplex has been completed. Preliminary results can be found in CV Proc through chart review.  Results were given to the patient's nurse, Pamelia Hoit.  03/30/23 1:22 PM Olen Cordial RVT

## 2023-03-30 NOTE — Progress Notes (Signed)
Progress Note  2 Days Post-Op  Subjective: Alert and oriented to self and situation. Does not know the year. He is having some stable nausea without vomiting and thinks he did drink some liquids yesterday. He had at least one bowel movement. He denies abdominal pain   Objective: Vital signs in last 24 hours: Temp:  [98 F (36.7 C)-99.6 F (37.6 C)] 98.6 F (37 C) (04/25 0351) Pulse Rate:  [45-135] 109 (04/25 0700) Resp:  [12-32] 27 (04/25 0700) BP: (93-130)/(53-94) 94/54 (04/25 0700) SpO2:  [92 %-100 %] 92 % (04/25 0700) FiO2 (%):  [28 %] 28 % (04/24 0746) Weight:  [132.9 kg] 132.9 kg (04/25 0434) Last BM Date : 03/30/23  Intake/Output from previous day: 04/24 0701 - 04/25 0700 In: 2059.5 [P.O.:1020; I.V.:387.6; IV Piggyback:652] Out: 700 [Urine:700] Intake/Output this shift: No intake/output data recorded.  PE: General: pleasant, WD, male who is laying in bed in NAD Lungs: Respiratory effort nonlabored on 2lpm Abd: soft, NT, ND, soft umbilical hernia Skin: warm and dry   Lab Results:  Recent Labs    03/28/23 0326 03/29/23 0255  WBC 10.4 9.8  HGB 9.1* 8.4*  HCT 32.2* 29.8*  PLT 467* 448*   BMET Recent Labs    03/28/23 0326 03/29/23 0255 03/29/23 1553  NA 136 133*  --   K 3.2* 2.9* 4.0  CL 98 101  --   CO2 25 22  --   GLUCOSE 112* 123*  --   BUN 26* 20  --   CREATININE 1.24 1.12  --   CALCIUM 8.2* 7.9*  --    PT/INR No results for input(s): "LABPROT", "INR" in the last 72 hours. CMP     Component Value Date/Time   NA 133 (L) 03/29/2023 0255   NA 144 07/17/2018 1232   K 4.0 03/29/2023 1553   CL 101 03/29/2023 0255   CO2 22 03/29/2023 0255   GLUCOSE 123 (H) 03/29/2023 0255   GLUCOSE 99 09/12/2006 1309   BUN 20 03/29/2023 0255   BUN 27 07/17/2018 1232   CREATININE 1.12 03/29/2023 0255   CALCIUM 7.9 (L) 03/29/2023 0255   PROT 5.7 (L) 03/28/2023 0326   ALBUMIN 2.2 (L) 03/28/2023 0326   AST 22 03/28/2023 0326   ALT 22 03/28/2023 0326    ALKPHOS 260 (H) 03/28/2023 0326   BILITOT 0.6 03/28/2023 0326   GFRNONAA >60 03/29/2023 0255   GFRAA 54 (L) 07/25/2019 0556   Lipase     Component Value Date/Time   LIPASE 25 05/29/2017 1541       Studies/Results: CT CHEST ABDOMEN PELVIS W CONTRAST  Result Date: 03/29/2023 CLINICAL DATA:  Rectosigmoid mass found on colonoscopy suspicious for adenocarcinoma, staging evaluation EXAM: CT CHEST, ABDOMEN, AND PELVIS WITH CONTRAST TECHNIQUE: Multidetector CT imaging of the chest, abdomen and pelvis was performed following the standard protocol during bolus administration of intravenous contrast. RADIATION DOSE REDUCTION: This exam was performed according to the departmental dose-optimization program which includes automated exposure control, adjustment of the mA and/or kV according to patient size and/or use of iterative reconstruction technique. CONTRAST:  OMNIPAQUE IOHEXOL 300 MG/ML  SOLN COMPARISON:  03/26/2023, 03/27/2023, 09/21/2015 FINDINGS: CT CHEST FINDINGS Cardiovascular: The heart is enlarged without pericardial effusion. No evidence of thoracic aortic aneurysm or dissection. Atherosclerosis of the aorta and native coronary vessels. 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 Mediastinum/Nodes: No enlarged mediastinal, hilar, or axillary lymph nodes. Thyroid gland, trachea, and esophagus demonstrate no significant findings. Lungs/Pleura: There are small bilateral pleural  effusions, left greater than right. There is dense bilateral lower lobe consolidation, left greater than right, likely representing a combination of airspace disease and atelectasis. Patchy nodular ground-glass airspace disease is seen within the left upper lobe, likely inflammatory or infectious. No pneumothorax. 5 mm right upper lobe pulmonary nodule is identified on image 44/5, indeterminate in this patient with a history of colon cancer. Close attention on follow-up is recommended. No other solid pulmonary nodules are  identified. Musculoskeletal: There are no acute or destructive bony lesions. Reconstructed images demonstrate no additional findings. CT ABDOMEN PELVIS FINDINGS Hepatobiliary: The liver is unremarkable without focal abnormality identified on this single phase exam. No biliary duct dilation. The gallbladder is decompressed, with calcified gallstones identified. No evidence of acute cholecystitis. Pancreas: Unremarkable. No pancreatic ductal dilatation or surrounding inflammatory changes. Spleen: Normal in size without focal abnormality. Adrenals/Urinary Tract: There are bilateral nonobstructing renal calculi. There are at least 4 distinct calculi on the right, largest measuring 18 mm. At least 3 distinct calculi are seen on the left, largest measuring 9 mm. There are simple appearing right renal cysts which do not require specific imaging follow-up. There is a small amount of gas within the bladder lumen, as well as within a midpole calyx on the right. This could reflect gas introduced during catheterization, though correlation with urinalysis is recommended to exclude superimposed infection. Bladder is decompressed, with nonspecific wall thickening. The adrenals are unremarkable. Stomach/Bowel: Long segment annular mass of the mid sigmoid colon is identified, extending at least 9 cm in length reference image 107/3, corresponding to the reported colonoscopy findings. No pericolonic fat stranding in this region. There is no bowel obstruction or ileus. Diverticulosis of the distal colon without diverticulitis. Normal appendix right lower quadrant. Vascular/Lymphatic: Atherosclerosis of the aorta and its branches. There are no pathologic lymph nodes identified within the abdomen or pelvis. Numerous subcentimeter lymph nodes within the retroperitoneum are stable and nonspecific. Reproductive: Prostate is unremarkable. Other: No free fluid or free intraperitoneal gas. Fat containing umbilical hernia unchanged. No bowel  herniation. Musculoskeletal: No acute or destructive bony lesions. Reconstructed images demonstrate no additional findings. IMPRESSION: 1. Long segment annular mass of the sigmoid colon compatible with neoplasm identified at colonoscopy. 2. Solitary right upper lobe 5 mm pulmonary nodule, nonspecific. This could be an atypical presentation for a solitary metastasis. Continued attention on follow-up is recommended. 3. Otherwise no evidence of intrathoracic, intra-abdominal, or intrapelvic metastases. 4. Dense areas of consolidation within the bilateral lower lobes, left greater than right, as well as patchy nodular airspace disease within the left upper lobe. Findings likely represent a combination of infection/inflammation and atelectasis. 5. Trace bilateral pleural effusions, left greater than right. 6. Cardiomegaly. 7. Colonic diverticulosis without diverticulitis. 8. Bilateral nonobstructing renal calculi. 9. Small amount of gas within the bladder lumen and a midpole right renal calyx, likely representing gas introduced during catheterization. Please correlate with urinalysis to exclude superimposed infection. 10.  Aortic Atherosclerosis (ICD10-I70.0). Electronically Signed   By: Sharlet Salina M.D.   On: 03/29/2023 15:52    Anti-infectives: Anti-infectives (From admission, onward)    Start     Dose/Rate Route Frequency Ordered Stop   03/27/23 1000  cefTRIAXone (ROCEPHIN) 2 g in sodium chloride 0.9 % 100 mL IVPB        2 g 200 mL/hr over 30 Minutes Intravenous Every 24 hours 03/27/23 0737     03/26/23 1600  cefTRIAXone (ROCEPHIN) 2 g in sodium chloride 0.9 % 100 mL IVPB  Status:  Discontinued        2 g 200 mL/hr over 30 Minutes Intravenous Every 24 hours 03/26/23 1510 03/27/23 0737   03/26/23 1600  azithromycin (ZITHROMAX) 500 mg in sodium chloride 0.9 % 250 mL IVPB  Status:  Discontinued        500 mg 250 mL/hr over 60 Minutes Intravenous Every 24 hours 03/26/23 1510 03/27/23 0736         Assessment/Plan  Partially obstructing recto-sigmoid colon mass  - Colonoscopy path of rectosigmoid mass with fragments of adenoma with high grade dysplasia and foci suspicious for adenocarcinoma.  - CEA 23.2 - CT ch/abd/pel without evidence of metastatic disease - Recommend oncology consult - having bowel movements  - am labs pending - cardiology following - would need repeat echo preop of surgery pursued  Discussed briefly with patient the option/possibility of surgery. We discussed he is high risk which he seems to understand and expresses he is not enthusiastic about pursuing surgery. He would like his son Treasure, [place], to be a part of discussion/decision making process. We will coordinate discussion with patient, family, and palliative care  FEN - FLD VTE - on hold due to GI bleed, home Eliquis on hold as well ID - Rocephin for CAP   Bedbound state HTN COPD, chronic hypoxic respiratory failure on chronic O2 CKD A. Fib on eliquis (on hold) Systolic/diastolic CHF OSA AAA CAP  I reviewed Consultant GI, Cardiology notes, hospitalist notes, last 24 h vitals and pain scores, last 48 h intake and output, last 24 h labs and trends, and last 24 h imaging results.    LOS: 5 days   Eric Form, [facility] 03/30/2023, 7:32 AM Please see Amion for pager number during day hours 7:00am-4:30pm

## 2023-03-30 NOTE — TOC Progression Note (Addendum)
Transition of Care [facility]) - Progression Note    Patient Details  Name: Charles Mckinney MRN: [id number] Date of Birth: 1951/10/24  Transition of Care [facility]) CM/SW Contact  Lavenia Atlas, RN Phone Number: 03/30/2023, 6:48 PM  Clinical Narrative:   Late documentation: This RNCM received secure chat from MD Mimms- palliative who recommends hospice at LTC facility. This RNCM called [facility], note the following: Primary MD: Benay Pike, [facility] SW: Currie Paris [phone] ext [phone]. This RN CM unable to leave voicemail for [facility] SW re: assistance with hospice services, will continue to follow.  Will notify [facility] regarding plan to return with hospice services. Once able to speak with [facility] will offer chose for home hospice.   - 7:05pm This RNCM spoke with patient's son Charles Mckinney who reports he is unfamiliar with home hospice and wants assistance with making a choice. This RNCM provided [email or web address] and will email resources to patient's son email address: [email or web address]. This RNCM advised awaiting response from [place] to move forward. [name] son Charles Mckinney is in a agreement.    TOC following for discharge needs.     Expected Discharge Plan: Long Term Nursing Home Barriers to Discharge: Continued Medical Work up  Expected Discharge Plan and Services In-house Referral: Chaplain, Hospice / Palliative Care Discharge Planning Services: CM Consult   Living arrangements for the past 2 months: Skilled Nursing Facility ([facility] LTC patient)                 DME Arranged: N/A DME Agency: NA                   Social Determinants of Health (SDOH) Interventions SDOH Screenings   Food Insecurity: No Food Insecurity (03/25/2023)  Housing: Low Risk  (03/25/2023)  Transportation Needs: No Transportation Needs (03/25/2023)  Utilities: Not At Risk (03/25/2023)  Tobacco Use: Medium Risk (03/29/2023)    Readmission Risk Interventions     No data to display

## 2023-03-30 NOTE — Progress Notes (Signed)
PROGRESS NOTE  Charles Mckinney  XBM:[id number] DOB: 01-27-1951 DOA: 03/24/2023 PCP: Virl Cagey, MD   Brief Narrative: Patient is a 72 year old male from [facility] with history of hypertension, COPD, chronic hypoxic respiratory failure on 2 L of oxygen , CKD stage IIIb, atrial fibrillation on Eliquis, combined systolic/diastolic CHF, OSA, AAA who presents to the emergency department with complaint of dark blood in the stool multiple times.  He noticed dark red blood in diaper multiple times over the past week.  Takes Eliquis.  On presentation, lab work showed potassium of 2.2, hemoglobin 7.4, creatinine of 1.9 . He denies any nausea, abdomen pain or vomiting.  Blood pressure was soft on presentation.  Patient was admitted for the management of  GI bleed, GI consulted. Hospital course remarkable for persistent A-fib with RVR, hypokalemia, melanotic stools.  Currently on amiodarone.  Cardiology following.  Colonoscopy showed large rectosigmoid partially obstructing mass ,likely malignant.  Palliative care also consulted for goals of care.  General surgery following.  Oncology also consulted today as per general surgery request.  Assessment & Plan:  Acute lower GI bleed: Presented with hematochezia, dark stool.  Hemoglobin of 7.4 on presentation.  Given 2 units of blood transfusion.  GI consulted.  Hemoglobin stable in the range of 9 this morning. On Protonix. EGD showed nonbleeding gastric ulcer with a clean ulcer base.  Colonoscopy showed large rectosigmoid partially obstructing mass .On full liquid diet  Rectosigmoid adenocarcinoma: Colonoscopy showed large rectosigmoid partially obstructing mass .biopsy positive for adenocarcinoma. general surgery consulted, requesting oncology input. Message sent to  Dr. Leonides Schanz. CT chest/abdomen/pelvis with contrast showed solitary right upper lobe 5 mm pulmonary nodule   Persistent A-fib with RVR: On Eliquis at home and metoprolol for rate control.    Eliquis on hold. Blood pressure was soft on 4/22 and he was not rapid A-fib so he was sent down to stepdown and started on amiodarone drip.  Cardiology following.  BP remains high and heart rate remains more than 100 most of the time.  Gram-negative bacteremia: Had mild leukocytosis, became febrile on 4/21.  Urine culture/blood cultures showing E. coli.  Also has mild erythema bilateral lower extremities.  Currently on ceftriaxone.    AKI in CKD stage IIIa: Baseline creatinine was 1.5 ,3 years ago . Presented with creatinine of 1.9.  Most likely prerenal in the setting of blood loss.  antihypertensives on hold.  Kidney function has normalized.  Combined systolic/diastolic CHF: Echo done on 10/2018 showed EF of 30 to 35%, diffuse hypokinesis. Has chronic edema bilateral lower extremities.  Cardiology consulted and following.  Given a dose of Lasix IV 40 mg on 4/24   Hypokalemia: Being aggressively supplemented and monitored.  Magnesium level normal on 4/22   OSA: On CPAP   COPD: Not in exacerbation.  Uses 2L of at home.   Elevated alkaline phosphatase: Elevated GGT. liver enzymes normal.  Right upper quadrant ultrasound showed hepatic steatosis  Hyponatremia: Stable  Left upper extremity edema: Will check venous Doppler.  Likely from IV line   Debility/deconditioning/obesity: Morbidly obese patient.  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  Now has colorectal cancer, permanent A-fib, E. coli bacteremia.  Ambulates with the help of wheelchair.  Lives at a SNF, [facility] consulted.  Very poor quality of life due to several comorbidities.  Still full code.  Palliaitve care following.  We recommend continuing goals of care discussion and possibly transition to comfort care.Family and patient undecided yet  Nutrition  Problem: Increased nutrient needs Etiology: chronic illness (COPD, CKD, CHF) Pressure Injury 03/25/23 Buttocks Right Stage 2 -  Partial thickness loss of dermis presenting  as a shallow open injury with a red, pink wound bed without slough. (Active)  03/25/23 1522  Location: Buttocks  Location Orientation: Right  Staging: Stage 2 -  Partial thickness loss of dermis presenting as a shallow open injury with a red, pink wound bed without slough.  Wound Description (Comments):   Present on Admission: Yes  Dressing Type Foam - Lift dressing to assess site every shift 03/29/23 2000     Pressure Injury 03/25/23 Buttocks Right Stage 2 -  Partial thickness loss of dermis presenting as a shallow open injury with a red, pink wound bed without slough. (Active)  03/25/23 1523  Location: Buttocks  Location Orientation: Right  Staging: Stage 2 -  Partial thickness loss of dermis presenting as a shallow open injury with a red, pink wound bed without slough.  Wound Description (Comments):   Present on Admission: Yes  Dressing Type Foam - Lift dressing to assess site every shift 03/29/23 2000    DVT prophylaxis:SCDs Start: 03/25/23 0408     Code Status: Full Code  Family Communication:Called and discussed with son on phone on 4/25  Patient status: Inpatient  Patient is from : SNF  Anticipated discharge to: Not sure  Estimated DC date: Not sure   Consultants: GI, cardiology, palliative care, general surgery,oncology  Procedures: EGD/colonoscopy  Antimicrobials:  Anti-infectives (From admission, onward)    Start     Dose/Rate Route Frequency Ordered Stop   03/27/23 1000  cefTRIAXone (ROCEPHIN) 2 g in sodium chloride 0.9 % 100 mL IVPB        2 g 200 mL/hr over 30 Minutes Intravenous Every 24 hours 03/27/23 0737     03/26/23 1600  cefTRIAXone (ROCEPHIN) 2 g in sodium chloride 0.9 % 100 mL IVPB  Status:  Discontinued        2 g 200 mL/hr over 30 Minutes Intravenous Every 24 hours 03/26/23 1510 03/27/23 0737   03/26/23 1600  azithromycin (ZITHROMAX) 500 mg in sodium chloride 0.9 % 250 mL IVPB  Status:  Discontinued        500 mg 250 mL/hr over 60 Minutes  Intravenous Every 24 hours 03/26/23 1510 03/27/23 0736       Subjective: Patient seen and examined at bedside today.  Appears overall comfortable.  Not in distress.  Lying in bed.  Blood pressure remains soft with heart rate in the range of 100s.  Denies any worsening shortness of breath or cough today.  No new bloody bowel movement or black stools.  He was constantly saying that he wants to eat juice.  Knows that he is in the hospital, knows current year. Doesnot want to engage with goals of care conversation   Objective: Vitals:   03/30/23 1000 03/30/23 1057 03/30/23 1100 03/30/23 1142  BP:  (!) 117/94 110/63   Pulse: 93 85 (!) 112 (!) 127  Resp: (!) 24 (!) 27 (!) 23 (!) 27  Temp:      TempSrc:      SpO2: 97% 99% 98% 99%  Weight:      Height:        Intake/Output Summary (Last 24 hours) at 03/30/2023 1232 Last data filed at 03/30/2023 0600 Gross per 24 hour  Intake 1131.24 ml  Output 250 ml  Net 881.24 ml   Filed Weights   03/26/23 0500 03/27/23 0449 03/30/23  0434  Weight: 131.2 kg 127.8 kg 132.9 kg    Examination:  General exam: Very deconditioned, weak, morbidly obese HEENT: PERRL Respiratory system: Fine crackles bilaterally, diminished air sounds Cardiovascular system: Irregularly irregular rhythm Gastrointestinal system: Abdomen is obese, soft and nontender. Central nervous system: Alert and mostly oriented Extremities: Trace lower edema edema, no clubbing ,no cyanosis, edema of the left upper extremity Skin: Pressure ulcer, erythematous rash on bilateral lower extremities  Data Reviewed: I have personally reviewed following labs and imaging studies  CBC: Recent Labs  Lab 03/24/23 2255 03/25/23 0600 03/26/23 0731 03/26/23 1652 03/27/23 0524 03/28/23 0326 03/29/23 0255 03/30/23 0811  WBC 12.1*  --  11.8*  --   --  10.4 9.8 11.1*  HGB 7.4*   < > 8.5* 8.7* 8.9* 9.1* 8.4* 9.5*  HCT 25.0*   < > 29.1* 29.9* 30.1* 32.2* 29.8* 33.3*  MCV 84.2  --  85.6  --    --  89.9 90.3 89.0  PLT 379  --  388  --   --  467* 448* 454*   < > = values in this interval not displayed.   Basic Metabolic Panel: Recent Labs  Lab 03/25/23 0600 03/25/23 0859 03/25/23 0950 03/26/23 0731 03/27/23 0523 03/27/23 0524 03/27/23 1734 03/28/23 0326 03/29/23 0255 03/29/23 1553 03/30/23 0811  NA 123*  --    < > 135  --  137  --  136 133*  --  133*  K 2.6*  --    < > 3.1*  --  2.6* 2.8* 3.2* 2.9* 4.0 3.7  CL 83*  --    < > 100  --  100  --  98 101  --  100  CO2 26  --    < > 26  --  26  --  25 22  --  22  GLUCOSE 406*  --    < > 121*  --  128*  --  112* 123*  --  121*  BUN 49*  --    < > 42*  --  35*  --  26* 20  --  17  CREATININE 1.69*  --    < > 1.37*  --  1.11  --  1.24 1.12  --  1.23  CALCIUM 7.6*  --    < > 7.9*  --  8.0*  --  8.2* 7.9*  --  8.2*  MG 9.7* 2.4  --   --  2.3  --   --   --   --   --   --    < > = values in this interval not displayed.     Recent Results (from the past 240 hour(s))  MRSA Next Gen by PCR, Nasal     Status: None   Collection Time: 03/25/23  3:31 PM   Specimen: Nasal Mucosa; Nasal Swab  Result Value Ref Range Status   MRSA by PCR Next Gen NOT DETECTED NOT DETECTED Final    Comment: (NOTE) The GeneXpert MRSA Assay (FDA approved for NASAL specimens only), is one component of a comprehensive MRSA colonization surveillance program. It is not intended to diagnose MRSA infection nor to guide or monitor treatment for MRSA infections. Test performance is not FDA approved in patients less than 52 years old. Performed at [facility], [address] [place], [place] [address]   Urine Culture (for pregnant, neutropenic or urologic patients or patients with an indwelling urinary catheter)  Status: Abnormal   Collection Time: 03/26/23 10:39 AM   Specimen: Urine, Clean Catch  Result Value Ref Range Status   Specimen Description   Final    URINE, CLEAN CATCH Performed at [facility], [address] [place], [place] [address]    Special Requests   Final    NONE Performed at [facility], [address] [place], [place] [address]    Culture >=100,000 COLONIES/mL ESCHERICHIA COLI (A)  Final   Report Status 03/28/2023 FINAL  Final   Organism ID, Bacteria ESCHERICHIA COLI (A)  Final      Susceptibility   Escherichia coli - MIC*    AMPICILLIN >=32 RESISTANT Resistant     CEFAZOLIN <=4 SENSITIVE Sensitive     CEFEPIME <=0.12 SENSITIVE Sensitive     CEFTRIAXONE <=0.25 SENSITIVE Sensitive     CIPROFLOXACIN <=0.25 SENSITIVE Sensitive     GENTAMICIN <=1 SENSITIVE Sensitive     IMIPENEM <=0.25 SENSITIVE Sensitive     NITROFURANTOIN <=16 SENSITIVE Sensitive     TRIMETH/SULFA >=320 RESISTANT Resistant     AMPICILLIN/SULBACTAM 8 SENSITIVE Sensitive     PIP/TAZO <=4 SENSITIVE Sensitive     * >=100,000 COLONIES/mL ESCHERICHIA COLI  Culture, blood (Routine X 2) w Reflex to ID Panel     Status: Abnormal   Collection Time: 03/26/23  2:36 PM   Specimen: BLOOD  Result Value Ref Range Status   Specimen Description   Final    BLOOD BLOOD LEFT ARM Performed at [facility], [address], [place], [place] [address]    Special Requests   Final    AEROBIC BOTTLE ONLY Blood Culture adequate volume Performed at [facility], [address], [place], [place] [address]    Culture  Setup Time   Final    GRAM NEGATIVE RODS AEROBIC BOTTLE ONLY CRITICAL RESULT CALLED TO, READ BACK BY AND VERIFIED WITH: PHARMD N. GLOGOVAC 03/27/23 @ 0625 BY AB Performed at [facility], [address], [place], [place] [address]    Culture ESCHERICHIA COLI (A)  Final   Report Status 03/29/2023 FINAL  Final   Organism ID, Bacteria ESCHERICHIA COLI  Final      Susceptibility   Escherichia coli - MIC*    AMPICILLIN >=32 RESISTANT Resistant     CEFEPIME <=0.12 SENSITIVE Sensitive     CEFTAZIDIME <=1 SENSITIVE Sensitive     CEFTRIAXONE <=0.25  SENSITIVE Sensitive     CIPROFLOXACIN <=0.25 SENSITIVE Sensitive     GENTAMICIN <=1 SENSITIVE Sensitive     IMIPENEM <=0.25 SENSITIVE Sensitive     TRIMETH/SULFA >=320 RESISTANT Resistant     AMPICILLIN/SULBACTAM 8 SENSITIVE Sensitive     PIP/TAZO <=4 SENSITIVE Sensitive     * ESCHERICHIA COLI  Blood Culture ID Panel (Reflexed)     Status: Abnormal   Collection Time: 03/26/23  2:36 PM  Result Value Ref Range Status   Enterococcus faecalis NOT DETECTED NOT DETECTED Final   Enterococcus Faecium NOT DETECTED NOT DETECTED Final   Listeria monocytogenes NOT DETECTED NOT DETECTED Final   Staphylococcus species NOT DETECTED NOT DETECTED Final   Staphylococcus aureus (BCID) NOT DETECTED NOT DETECTED Final   Staphylococcus epidermidis NOT DETECTED NOT DETECTED Final   Staphylococcus lugdunensis NOT DETECTED NOT DETECTED Final   Streptococcus species NOT DETECTED NOT DETECTED Final   Streptococcus agalactiae NOT DETECTED NOT DETECTED Final   Streptococcus pneumoniae NOT DETECTED NOT DETECTED Final   Streptococcus pyogenes NOT  DETECTED NOT DETECTED Final   A.calcoaceticus-baumannii NOT DETECTED NOT DETECTED Final   Bacteroides fragilis NOT DETECTED NOT DETECTED Final   Enterobacterales DETECTED (A) NOT DETECTED Final    Comment: Enterobacterales represent a large order of gram negative bacteria, not a single organism. CRITICAL RESULT CALLED TO, READ BACK BY AND VERIFIED WITH: PHARMD N. GLOGOVAC 03/27/23 @ 0625 BY AB    Enterobacter cloacae complex NOT DETECTED NOT DETECTED Final   Escherichia coli DETECTED (A) NOT DETECTED Final    Comment: CRITICAL RESULT CALLED TO, READ BACK BY AND VERIFIED WITH: PHARMD N. GLOGOVAC 03/27/23 @ 0625 BY AB    Klebsiella aerogenes NOT DETECTED NOT DETECTED Final   Klebsiella oxytoca NOT DETECTED NOT DETECTED Final   Klebsiella pneumoniae NOT DETECTED NOT DETECTED Final   Proteus species NOT DETECTED NOT DETECTED Final   Salmonella species NOT DETECTED NOT  DETECTED Final   Serratia marcescens NOT DETECTED NOT DETECTED Final   Haemophilus influenzae NOT DETECTED NOT DETECTED Final   Neisseria meningitidis NOT DETECTED NOT DETECTED Final   Pseudomonas aeruginosa NOT DETECTED NOT DETECTED Final   Stenotrophomonas maltophilia NOT DETECTED NOT DETECTED Final   Candida albicans NOT DETECTED NOT DETECTED Final   Candida auris NOT DETECTED NOT DETECTED Final   Candida glabrata NOT DETECTED NOT DETECTED Final   Candida krusei NOT DETECTED NOT DETECTED Final   Candida parapsilosis NOT DETECTED NOT DETECTED Final   Candida tropicalis NOT DETECTED NOT DETECTED Final   Cryptococcus neoformans/gattii NOT DETECTED NOT DETECTED Final   CTX-M ESBL NOT DETECTED NOT DETECTED Final   Carbapenem resistance IMP NOT DETECTED NOT DETECTED Final   Carbapenem resistance KPC NOT DETECTED NOT DETECTED Final   Carbapenem resistance NDM NOT DETECTED NOT DETECTED Final   Carbapenem resist OXA 48 LIKE NOT DETECTED NOT DETECTED Final   Carbapenem resistance VIM NOT DETECTED NOT DETECTED Final    Comment: Performed at [facility], [address], [place], [place] [address]  Culture, blood (Routine X 2) w Reflex to ID Panel     Status: None (Preliminary result)   Collection Time: 03/26/23  4:52 PM   Specimen: BLOOD  Result Value Ref Range Status   Specimen Description   Final    BLOOD BLOOD LEFT ARM Performed at [facility], [address] [place], [place] [address]    Special Requests   Final    AEROBIC BOTTLE ONLY Blood Culture adequate volume Performed at [facility], [address] [place], [place] [address]    Culture   Final    NO GROWTH 4 DAYS Performed at [facility], [address], [place], [place] [address]    Report Status PENDING  Incomplete     Radiology Studies: CT CHEST ABDOMEN PELVIS W CONTRAST  Result Date: 03/29/2023 CLINICAL DATA:  Rectosigmoid mass found on colonoscopy suspicious for  adenocarcinoma, staging evaluation EXAM: CT CHEST, ABDOMEN, AND PELVIS WITH CONTRAST TECHNIQUE: Multidetector CT imaging of the chest, abdomen and pelvis was performed following the standard protocol during bolus administration of intravenous contrast. RADIATION DOSE REDUCTION: This exam was performed according to the departmental dose-optimization program which includes automated exposure control, adjustment of the mA and/or kV according to patient size and/or use of iterative reconstruction technique. CONTRAST:  OMNIPAQUE IOHEXOL 300 MG/ML  SOLN COMPARISON:  03/26/2023, 03/27/2023, 09/21/2015 FINDINGS: CT CHEST FINDINGS Cardiovascular: The heart is enlarged without pericardial effusion. No evidence of thoracic aortic aneurysm or dissection. Atherosclerosis of the  aorta and native coronary vessels. 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 Mediastinum/Nodes: No enlarged mediastinal, hilar, or axillary lymph nodes. Thyroid gland, trachea, and esophagus demonstrate no significant findings. Lungs/Pleura: There are small bilateral pleural effusions, left greater than right. There is dense bilateral lower lobe consolidation, left greater than right, likely representing a combination of airspace disease and atelectasis. Patchy nodular ground-glass airspace disease is seen within the left upper lobe, likely inflammatory or infectious. No pneumothorax. 5 mm right upper lobe pulmonary nodule is identified on image 44/5, indeterminate in this patient with a history of colon cancer. Close attention on follow-up is recommended. No other solid pulmonary nodules are identified. Musculoskeletal: There are no acute or destructive bony lesions. Reconstructed images demonstrate no additional findings. CT ABDOMEN PELVIS FINDINGS Hepatobiliary: The liver is unremarkable without focal abnormality identified on this single phase exam. No biliary duct dilation. The gallbladder is decompressed, with calcified gallstones identified. No  evidence of acute cholecystitis. Pancreas: Unremarkable. No pancreatic ductal dilatation or surrounding inflammatory changes. Spleen: Normal in size without focal abnormality. Adrenals/Urinary Tract: There are bilateral nonobstructing renal calculi. There are at least 4 distinct calculi on the right, largest measuring 18 mm. At least 3 distinct calculi are seen on the left, largest measuring 9 mm. There are simple appearing right renal cysts which do not require specific imaging follow-up. There is a small amount of gas within the bladder lumen, as well as within a midpole calyx on the right. This could reflect gas introduced during catheterization, though correlation with urinalysis is recommended to exclude superimposed infection. Bladder is decompressed, with nonspecific wall thickening. The adrenals are unremarkable. Stomach/Bowel: Long segment annular mass of the mid sigmoid colon is identified, extending at least 9 cm in length reference image 107/3, corresponding to the reported colonoscopy findings. No pericolonic fat stranding in this region. There is no bowel obstruction or ileus. Diverticulosis of the distal colon without diverticulitis. Normal appendix right lower quadrant. Vascular/Lymphatic: Atherosclerosis of the aorta and its branches. There are no pathologic lymph nodes identified within the abdomen or pelvis. Numerous subcentimeter lymph nodes within the retroperitoneum are stable and nonspecific. Reproductive: Prostate is unremarkable. Other: No free fluid or free intraperitoneal gas. Fat containing umbilical hernia unchanged. No bowel herniation. Musculoskeletal: No acute or destructive bony lesions. Reconstructed images demonstrate no additional findings. IMPRESSION: 1. Long segment annular mass of the sigmoid colon compatible with neoplasm identified at colonoscopy. 2. Solitary right upper lobe 5 mm pulmonary nodule, nonspecific. This could be an atypical presentation for a solitary metastasis.  Continued attention on follow-up is recommended. 3. Otherwise no evidence of intrathoracic, intra-abdominal, or intrapelvic metastases. 4. Dense areas of consolidation within the bilateral lower lobes, left greater than right, as well as patchy nodular airspace disease within the left upper lobe. Findings likely represent a combination of infection/inflammation and atelectasis. 5. Trace bilateral pleural effusions, left greater than right. 6. Cardiomegaly. 7. Colonic diverticulosis without diverticulitis. 8. Bilateral nonobstructing renal calculi. 9. Small amount of gas within the bladder lumen and a midpole right renal calyx, likely representing gas introduced during catheterization. Please correlate with urinalysis to exclude superimposed infection. 10.  Aortic Atherosclerosis (ICD10-I70.0). Electronically Signed   By: Sharlet Salina M.D.   On: 03/29/2023 15:52    Scheduled Meds:  Chlorhexidine Gluconate Cloth  6 each Topical Daily   feeding supplement  237 mL Oral BID BM   levothyroxine  100 mcg Oral Q0600   metoprolol tartrate  12.5 mg Oral BID   mometasone-formoterol  2  puff Inhalation BID   pantoprazole (PROTONIX) IV  40 mg Intravenous Q12H   sodium chloride flush  3 mL Intravenous Q12H   Continuous Infusions:  amiodarone 30 mg/hr (03/30/23 0600)   cefTRIAXone (ROCEPHIN)  IV Stopped (03/30/23 0955)     LOS: 5 days   Burnadette Pop, MD [facility] P4/25/2024, 12:32 PM

## 2023-03-30 NOTE — Progress Notes (Signed)
Daily Progress Note   Patient Name: Charles Mckinney       Date: 03/30/2023 DOB: 21-Feb-1951  Age: 72 y.o. MRN#: [id number] Attending Physician: Burnadette Pop, MD Primary Care Physician: Virl Cagey, MD Admit Date: 03/24/2023 Length of Stay: 5 days  Reason for Consultation/Follow-up: Establishing goals of care  Subjective:   CC: Patient more awake and alert today though can become confused with complex information.  Planning for family meeting today to discuss pathways for medical care moving forward.  Following up regarding complex medical decision making.   Subjective:  Discussed care with surgery team during the day.  Planning for family meeting at 2 PM this afternoon.  When visiting with patient in the a.m. before meeting, patient is able to state he has a mass found on imaging and would still want his son involved in conversations to help him think through everything.  Acknowledged this and the plan is for family meeting later today.  Presented to bedside later in afternoon for family meeting.  Met with patient and his son, Javonn.  [name] ex-wife able to join [place] during meeting as well.  Providers who are able to be present during meeting were surgical PA Kela Millin, oncologist Dr. Leonides Schanz, and myself as the palliative medicine provider.  Able to review patient's current medical information and finding of mass in colon.  Mass has been identified as cancer.  Able to discuss risk that would be associated with surgical interventions due to patient's multiple medical comorbidities and bedbound status.  Expressed that surgical interventions would cause more harm than provide quality of life moving forward.  Dr. Leonides Schanz able to join during conversation as well and noted that cancer directed therapy such as chemotherapy do not have a place when patient is not appropriate for surgery and could cause more harm than benefit.  Spent time providing emotional support via active listening.  Discussed  possible pathways for medical care moving forward including focusing on quality time with family.  Discussed involving hospice to support patient's symptom management moving forward with a prognosis of 6 months or less.  Family specifically asking regarding prognosis which is likely to be a few months, and so appropraite for hospice care.  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  Noted would reach out to social worker to assist with referral to hospice.  Patient already resides at a long-term care facility and so hospice would be layered and to assist with symptom management.  All questions answered at that time.  Thanked patient and family for meeting with [place] today.  Objective:   Vital Signs:  BP (!) 94/54   Pulse (!) 109   Temp 98.6 F (37 C) (Oral)   Resp (!) 27   Ht 5\' 10"  (1.778 m)   Wt 132.9 kg   SpO2 96%   BMI 42.04 kg/m   Physical Exam: General: NAD, laying in bed, chronically ill-appearing Eyes: No drainage noted Cardiovascular: tachycardia noted Respiratory: no increased work of breathing noted, not in respiratory distress, on [place] O2 Abdomen: obese Skin: no rashes or lesions on visible skin Neuro: Awake and interactive though can become confused during complex discussions and deferred some thoughts to son Psych: Patient is able to express sadness related to his cancer diagnosis.  Imaging:  I personally reviewed recent imaging.   Assessment & Plan:   Assessment: Patient is a 72 year old male with a past medical history of hypertension,  COPD, chronic hypoxic respiratory failure on 2 L of oxygen, CKD stage IIIb, atrial fibrillation on Eliquis, combined systolic/diastolic CHF, OSA, AAA, and severe arthritis leading to bedbound status with pressure ulcers who was admitted on 03/24/2019 for for management of multiple dark stools. Since admission, GI has been consulted for workup of acute lower GI bleed.  Patient also noted to have severe sepsis secondary to gram-negative bacteremia for which she is currently receiving antibiotics and is on pressor support. Palliative medicine team consulted to assist with complex medical decision making.   Recommendations/Plan: # Complex medical decision making/goals of care:  - Extensive discussion with patient, patient's son, and patient's ex-wife at bedside as described above in HPI.  Discussed weighing risk and benefits regarding further medical interventions moving forward.  Patient is incredibly high surgical risk and worried that surgical interventions may cause more harm than benefit.  Patient not a candidate for further directed cancer therapies in light of not being a good surgical candidate and again with poor functional status cancer directed therapies could cause more harm than benefit.  Discussed focusing on quality time for patient moving forward at his long-term care facility.  Discussed involvement of hospice at long-term care facilities to support symptoms at the end of life.  Patient and family agreeing with referral to hospice and plan for patient to return to long-term care facility.  -  Code Status: Full Code   -Did not address CODE STATUS during meeting as patient and family were appropriately overwhelmed digesting information that was presented.  Can be addressed at later time.  # Symptom management: Non-pharmacologic Delirium Precautions  - Frequent re-orientation; update board w/ date, names of treatment team  - Daytime stimulation: Open curtains, turn lights on, and turn on television as tolerated during waking hours  - Nighttime calm - close curtains, turn lights off, and turn off television at 9pm  with minimal interruptions from treatment team during sleeping hours, including avoiding lab draws if possible  - Encourage continued presence of family/friends  - Occupy w/ distractions - e.g. ask them to fold washcloths, busy-board  -  Encourage movement with PT/OT as tolerated  - Ensure adequate sensorium, to include providing glasses, dentures, and hearing aids to patient as appropriate  - Ensure adequate nutrition and fluid intake  - Avoid restraints whenever possible, but "safety first" . If restraints are necessary, please monitor CPK and BUN/Cr  - Assess and treat pain (incl nonverbal cues); e.g. consider scheduled tylenol  - Assess and treat constipation and urinary retention  - Ensure hydration, electrolyte balance (K to 4.0, Mg to 2.0), adequate oxygenation  - Review medications (addition, deletion, changes can trigger)  - If possible, avoid "high-risk" meds such as anticholinergics, BZ, steroids, antidepressants   # Discharge Planning: Return to long term care facility with hospice  -[facility] consulted to assist with coordination. Son did note patient was a veteran established with [employer].   Discussed with: bedside RN, surgery team   Thank you for allowing the palliative care team to participate in the care [name].  Alvester Morin, DO Palliative Care Provider PMT # [phone]  This provider spent a total of 52 minutes providing patient's care.  Includes review of EMR, discussing care with other staff members involved in patient's medical care, obtaining relevant history and information from patient and/or patient's family, and personal review of imaging and lab work. Greater than 50% of the time was spent counseling and coordinating care related to the above assessment  and plan.    *Please note that this is a verbal dictation therefore any spelling or grammatical errors are due to the "Dragon Medical One" system interpretation.

## 2023-03-30 NOTE — Progress Notes (Signed)
Patient continues to decline nocturnal CPAP.  

## 2023-03-30 NOTE — Consult Note (Signed)
Hematology/Oncology Consult Note  Clinical Summary: Mr. Rodrigo Mcgranahan is a 72 year old male currently admitted with dark stools and anemia, found to have a large colon mass.  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  Reason for Consult: New colon mass consistent with adenocarcinoma of the colon  HPI: Mr. Kimon Loewen is a 72 year old male with medical history significant for COPD, chronic hypoxic respiratory failure on 2 L of oxygen , CKD stage IIIb, atrial fibrillation on Eliquis, and combined systolic/diastolic CHF who is currently admitted with dark stools and was found to have a mass of the colon.  Mr. Camilli was admitted to the hospital on 03/25/2023 with concern for GI bleed due to having dark stools in his diaper.  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  Surgical services and palliative care were both consulted.  On exam today we are accompanied by both palliative care and the surgical services.  Surgery noted their hesitation with proceeding with a colon mass resection given his markedly poor functional status and numerous comorbidities.  Additionally we contributed to that chemotherapy may cause more harm than good and the patient with so many comorbidities.  We discussed the risks and benefits of chemotherapy treatment and the overall prognosis of this colon mass.  The patient, his son, and his wife were at bedside during this discussion.    O:  Vitals:   03/30/23 1400 03/30/23 1500  BP: (!) 124/92 113/75  Pulse: (!) 103 (!) 121  Resp: (!) 25 19  Temp:    SpO2: 97% 96%      Latest Ref Rng & Units 03/30/2023    8:11 AM 03/29/2023    3:53 PM 03/29/2023    2:55 AM  CMP  Glucose 70 - 99 mg/dL 409   811   BUN 8 - 23 mg/dL 17   20   Creatinine 9.14 - 1.24 mg/dL 7.82   9.56   Sodium 213 - 145 mmol/L 133   133   Potassium 3.5 - 5.1 mmol/L 3.7  4.0  2.9   Chloride 98 - 111 mmol/L  100   101   CO2 22 - 32 mmol/L 22   22   Calcium 8.9 - 10.3 mg/dL 8.2   7.9       Latest Ref Rng & Units 03/30/2023    8:11 AM 03/29/2023    2:55 AM 03/28/2023    3:26 AM  CBC  WBC 4.0 - 10.5 K/uL 11.1  9.8  10.4   Hemoglobin 13.0 - 17.0 g/dL 9.5  8.4  9.1   Hematocrit 39.0 - 52.0 % 33.3  29.8  32.2   Platelets 150 - 400 K/uL 454  448  467       GENERAL: Chronically ill-appearing elderly Caucasian male in NAD  SKIN: skin color, texture, turgor are normal, no rashes or significant lesions EYES: conjunctiva are pink and non-injected, sclera clear LUNGS: clear to auscultation and percussion with normal breathing effort HEART: regular rate & rhythm and no murmurs and no lower extremity edema Musculoskeletal: no cyanosis of digits and no clubbing  PSYCH: alert & oriented x 3, fluent speech NEURO: no focal motor/sensory deficits  Assessment/Plan:  # Colonic Mass, Consistent with Adenocarcinoma of the Colon -- CT scan of the colon performed on 03/29/2023 which showed annular mass of the sigmoid colon compatible with neoplasm.  Colonoscopy the prior day performed biopsies which showed findings consistent with adenocarcinoma. -- Surgery  declines to proceed with intervention given his numerous comorbidities. -- Additionally chemotherapy would likely pose too many risks for this patient and could potentially harm him more than help him. -- Given these findings agree with the surgical service and palliative care service that comfort based care moving forward be most appropriate. -- Patient is family voiced understanding of our concerns regarding surgical or chemotherapy intervention. -- Oncology service will continue to be available for any questions or concerns.   Ulysees Barns, MD Department of Hematology/Oncology [facility] Phone: [phone] Pager: [phone] Email: [email or web address]

## 2023-03-31 DIAGNOSIS — I25119 Atherosclerotic heart disease of native coronary artery with unspecified angina pectoris: Secondary | ICD-10-CM

## 2023-03-31 DIAGNOSIS — I5042 Chronic combined systolic (congestive) and diastolic (congestive) heart failure: Secondary | ICD-10-CM | POA: Diagnosis not present

## 2023-03-31 DIAGNOSIS — Z515 Encounter for palliative care: Secondary | ICD-10-CM | POA: Diagnosis not present

## 2023-03-31 DIAGNOSIS — N179 Acute kidney failure, unspecified: Secondary | ICD-10-CM | POA: Diagnosis not present

## 2023-03-31 DIAGNOSIS — K625 Hemorrhage of anus and rectum: Secondary | ICD-10-CM

## 2023-03-31 DIAGNOSIS — I4821 Permanent atrial fibrillation: Secondary | ICD-10-CM | POA: Diagnosis not present

## 2023-03-31 DIAGNOSIS — K922 Gastrointestinal hemorrhage, unspecified: Secondary | ICD-10-CM | POA: Diagnosis not present

## 2023-03-31 LAB — BASIC METABOLIC PANEL
Anion gap: 11 (ref 5–15)
BUN: 17 mg/dL (ref 8–23)
CO2: 21 mmol/L — ABNORMAL LOW (ref 22–32)
Calcium: 8.1 mg/dL — ABNORMAL LOW (ref 8.9–10.3)
Chloride: 100 mmol/L (ref 98–111)
Creatinine, Ser: 1.03 mg/dL (ref 0.61–1.24)
GFR, Estimated: >60 mL/min (ref >60)
Glucose, Bld: 113 mg/dL — ABNORMAL HIGH (ref 70–99)
Potassium: 3.4 mmol/L — ABNORMAL LOW (ref 3.5–5.1)
Sodium: 132 mmol/L — ABNORMAL LOW (ref 135–145)

## 2023-03-31 LAB — CBC
HCT: 30.7 % — ABNORMAL LOW (ref 39.0–52.0)
Hemoglobin: 8.8 g/dL — ABNORMAL LOW (ref 13.0–17.0)
MCH: 25.2 pg — ABNORMAL LOW (ref 26.0–34.0)
MCHC: 28.7 g/dL — ABNORMAL LOW (ref 30.0–36.0)
MCV: 88 fL (ref 80.0–100.0)
Platelets: 426 10*3/uL — ABNORMAL HIGH (ref 150–400)
RBC: 3.49 MIL/uL — ABNORMAL LOW (ref 4.22–5.81)
RDW: 16.8 % — ABNORMAL HIGH (ref 11.5–15.5)
WBC: 10 10*3/uL (ref 4.0–10.5)
nRBC: 0 % (ref 0.0–0.2)

## 2023-03-31 MED ORDER — METOPROLOL TARTRATE 25 MG PO TABS
25.0000 mg | ORAL_TABLET | Freq: Two times a day (BID) | ORAL | Status: DC
  Administered 2023-03-31 – 2023-04-08 (×16): 25 mg via ORAL
  Filled 2023-03-31 (×17): qty 1

## 2023-03-31 MED ORDER — POTASSIUM CHLORIDE CRYS ER 20 MEQ PO TBCR
40.0000 meq | EXTENDED_RELEASE_TABLET | Freq: Once | ORAL | Status: AC
  Administered 2023-03-31: 40 meq via ORAL
  Filled 2023-03-31: qty 2

## 2023-03-31 MED ORDER — MELATONIN 5 MG PO TABS
5.0000 mg | ORAL_TABLET | Freq: Once | ORAL | Status: AC
  Administered 2023-04-01: 5 mg via ORAL
  Filled 2023-03-31: qty 1

## 2023-03-31 MED ORDER — AMIODARONE HCL 200 MG PO TABS
200.0000 mg | ORAL_TABLET | Freq: Every day | ORAL | Status: DC
  Administered 2023-03-31 – 2023-04-08 (×9): 200 mg via ORAL
  Filled 2023-03-31 (×10): qty 1

## 2023-03-31 NOTE — TOC Progression Note (Addendum)
Transition of Care [facility]) - Progression Note    Patient Details  Name: Charles Mckinney MRN: [id number] Date of Birth: Sep 16, 1951  Transition of [facility]) CM/SW Contact  Lavenia Atlas, RN Phone Number: 03/31/2023, 12:24 PM  Clinical Narrative:   Per chart review [facility] has [facility] with 2 additional medical insurance plans. This RNCM spoke with [place] (SW with [facility]), who reports patient is not service connected. Patient has not seen [facility] Primary since 2021 and will need to see [facility] primary provider within 1 year for services.  This RNCM spoke with Wille Celeste with [facility] who reports their facility accepts [facility] for hospice services.  Awaiting discharge summary. Notified MD    This RNCM spoke with patient's son [name]) to offer choice for hospice services, he chose [facility]. This RNCM made referral to [facility] with [facility]. Danford Bad is able to follow patient, awaiting discharge summary.       TOC will continue to follow for needs.  - 1:41pm Patient transferred to room 1439, MD advised that patient no longer wants to go back to LTC facility.   Expected Discharge Plan: Long Term Nursing Home Barriers to Discharge: Continued Medical Work up  Expected Discharge Plan and Services In-house Referral: Chaplain, Hospice / Palliative Care Discharge Planning Services: CM Consult   Living arrangements for the past 2 months: Skilled Nursing Facility ([facility] LTC patient)                 DME Arranged: N/A DME Agency: NA                   Social Determinants of Health (SDOH) Interventions SDOH Screenings   Food Insecurity: No Food Insecurity (03/25/2023)  Housing: Low Risk  (03/25/2023)  Transportation Needs: No Transportation Needs (03/25/2023)  Utilities: Not At Risk (03/25/2023)  Tobacco Use: Medium Risk (03/29/2023)    Readmission Risk Interventions     No data to display

## 2023-03-31 NOTE — Progress Notes (Signed)
Daily Progress Note   Patient Name: Charles Mckinney       Date: 03/31/2023 DOB: 05-20-1951  Age: 72 y.o. MRN#: [id number] Attending Physician: Barnetta Chapel, MD Primary Care Physician: Virl Cagey, MD Admit Date: 03/24/2023 Length of Stay: 6 days  Reason for Consultation/Follow-up: Establishing goals of care  Subjective:   CC: Patient not he is hoping to get some rest today.  Following up regarding complex medical decision making and symptom management.  Subjective: Reviewed EMR prior to seeing patient.  It is for patient to return to [facility] with [facility] or care hospice support.  Also reviewed patient's symptom management needs.  In past 24 hours, patient has required IV Dilaudid 0.5 mg x 3 doses and oral oxycodone 5 mg x 2 doses.  Presented to bedside to check on patient later in afternoon.  Introduced myself again a member of the palliative medicine team.  No family present at bedside.  Patient noted he was about to take a nap and is hoping he can get some rest today.  Inquired about patient's symptom burden at this time.  Patient does feel that the pain medications are assisting with pain management at this time.  Does not feel these pain medications need to be increased.  Inquired about plans moving forward.  Patient knows he is returning to his nursing home.  Patient expressed concern about this so spent further time providing emotional support in discussing care moving forward.  Also explained to the patient will be returning to the long-term care facility with the support of [facility] hospice as a whole another team that we will support his symptom management and care.  Once spent time supporting emotions and discussing this, patient noted "then I am okay with going back".  Acknowledged this and noted team is working diligently to determine when he can appropriately be discharged there.  All questions answered at that time.  Thanked patient for allow me to visit with him  today.  Objective:   Vital Signs:  BP 112/69 (BP Location: Right Arm)   Pulse (!) 110   Temp 97.9 F (36.6 C) (Oral)   Resp (!) 22   Ht 5\' 10"  (1.778 m)   Wt 132.9 kg   SpO2 97%   BMI 42.04 kg/m   Physical Exam: General: NAD, laying in bed, chronically ill-appearing Eyes: No drainage noted Cardiovascular: tachycardia noted Respiratory: no increased work of breathing noted, not in respiratory distress, on [place] O2 Abdomen: obese Skin: no rashes or lesions on visible skin Neuro: Awake  Imaging:  I personally reviewed recent imaging.   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 Since admission, GI has been consulted for workup of acute lower GI bleed. Patient also noted to have severe sepsis secondary to gram-negative bacteremia for which she is currently receiving antibiotics and is on pressor support. Palliative medicine team consulted to assist with complex medical decision making.   Recommendations/Plan: # Complex medical decision making/goals of care:  - During extensive discussion with patient and family on 4/25, determined patient is a high surgical risk and not appropriate for further cancer directed therapies such as chemotherapy.  Goal is for patient to  return to his long-term care facility with hospice support.  TOC assisting with coordination and plan is for patient to return with [facility] or care hospice.   -Provided emotional support today and discussed plan with patient including hospice support and patient noted that he was " okay with returning there then".  -  Code Status: Full Code   -Did not address CODE  STATUS with patient as no family present. Can potentially be addressed at LTC facility with hospice team.  # Symptom management: Non-pharmacologic Delirium Precautions  - Frequent re-orientation; update board w/ date, names of treatment team  - Daytime stimulation: Open curtains, turn lights on, and turn on television as tolerated during waking hours  - Nighttime calm - close curtains, turn lights off, and turn off television at 9pm  with minimal interruptions from treatment team during sleeping hours, including avoiding lab draws if possible  - Encourage continued presence of family/friends  - Occupy w/ distractions - e.g. ask them to fold washcloths, busy-board  - Encourage movement with PT/OT as tolerated  - Ensure adequate sensorium, to include providing glasses, dentures, and hearing aids to patient as appropriate  - Ensure adequate nutrition and fluid intake  - Avoid restraints whenever possible, but "safety first" . If restraints are necessary, please monitor CPK and BUN/Cr  - Assess and treat pain (incl nonverbal cues); e.g. consider scheduled tylenol  - Assess and treat constipation and urinary retention  - Ensure hydration, electrolyte balance (K to 4.0, Mg to 2.0), adequate oxygenation  - Review medications (addition, deletion, changes can trigger)  - If possible, avoid "high-risk" meds such as anticholinergics, BZ, steroids, antidepressants   # Discharge Planning: Return to long term care facility with [facility] hospice  Thank you for allowing the palliative care team to participate in the care Charles Mckinney.  Alvester Morin, DO Palliative Care Provider PMT # [phone]  This provider spent a total of 27 minutes providing patient's care.  Includes review of EMR, discussing care with other staff members involved in patient's medical care, obtaining relevant history and information from patient and/or patient's family, and personal review of imaging and lab work. Greater than 50% of  the time was spent counseling and coordinating care related to the above assessment and plan.    *Please note that this is a verbal dictation therefore any spelling or grammatical errors are due to the "Dragon Medical One" system interpretation.

## 2023-03-31 NOTE — Progress Notes (Addendum)
Civil engineer, contracting [facility]) Hospital Liaison Note   Referral received for patient/family interest in hospice at home. [facility] spoke with patient's son to confirm interest. Interest confirmed.    Plan is to discharge to SNF tomorrow.    No DME needs.    Please send patient home with comfort medications/prescriptions at discharge.    Please call with any questions or concerns. Thank you   Dionicio Stall, [facility] Liaison [phone]

## 2023-03-31 NOTE — Progress Notes (Signed)
PT refusing cpap. 

## 2023-03-31 NOTE — Progress Notes (Signed)
PROGRESS NOTE  Charles Mckinney  VOZ:[id number] DOB: [birth year] DOA: 03/24/2023 PCP: Virl Cagey, MD   Brief Narrative: Patient is a 72 year old male from [facility] with history of hypertension, COPD, chronic hypoxic respiratory failure on 2 L of oxygen , CKD stage IIIb, atrial fibrillation on Eliquis, combined systolic/diastolic CHF, OSA, AAA who presents to the emergency department with complaint of dark blood in the stool multiple times.  He noticed dark red blood in diaper multiple times over the past week.  Takes Eliquis.  On presentation, lab work showed potassium of 2.2, hemoglobin 7.4, creatinine of 1.9 . He denies any nausea, abdomen pain or vomiting.  Blood pressure was soft on presentation.  Patient was admitted for the management of  GI bleed, GI consulted. Hospital course remarkable for persistent A-fib with RVR, hypokalemia, melanotic stools.  Currently on amiodarone.  Cardiology following.  Colonoscopy showed large rectosigmoid partially obstructing mass ,likely malignant.  Palliative care also consulted for goals of care.  General surgery following.  Oncology also consulted today as per general surgery request.  03/31/2023: Patient seen.  Patient is stable for discharge.  Awaiting discharge to facility.  Patient is not keen on being discharged to [facility]  Assessment & Plan:  Acute lower GI bleed: Presented with hematochezia, dark stool.  Hemoglobin of 7.4 on presentation.  Given 2 units of blood transfusion.  GI consulted.  Hemoglobin stable in the range of 9 this morning. On Protonix. EGD showed nonbleeding gastric ulcer with a clean ulcer base.  Colonoscopy showed large rectosigmoid partially obstructing mass .On full liquid diet 2624: GI bleed has resolved.  Rectosigmoid adenocarcinoma: Colonoscopy showed large rectosigmoid partially obstructing mass .biopsy positive for adenocarcinoma. general surgery consulted, requesting oncology input. Message sent to  Dr. Leonides Schanz. CT  chest/abdomen/pelvis with contrast showed solitary right upper lobe 5 mm pulmonary nodule  03/31/2023: Very not a surgical candidate.  Palliative care team has been consulted.  Persistent A-fib with RVR: On Eliquis at home and metoprolol for rate control.   Eliquis on hold. Blood pressure was soft on 4/22 and he was not rapid A-fib so he was sent down to stepdown and started on amiodarone drip.  Cardiology following.  BP remains high and heart rate remains more than 100 most of the time. 03/31/2023: Heart rate is currently rate controlled.  Patient is not on oral amiodarone.  Gram-negative bacteremia: Had mild leukocytosis, became febrile on 4/21.  Urine culture/blood cultures showing E. coli.  Also has mild erythema bilateral lower extremities.  Currently on ceftriaxone.  03/31/2023: Follow final cultures.   AKI in CKD stage IIIa: Baseline creatinine was 1.5 ,3 years ago . Presented with creatinine of 1.9.  Most likely prerenal in the setting of blood loss.  antihypertensives on hold.  Kidney function has normalized.  Combined systolic/diastolic CHF: Echo done on 10/2018 showed EF of 30 to 35%, diffuse hypokinesis. Has chronic edema bilateral lower extremities.  Cardiology consulted and following.  Given a dose of Lasix IV 40 mg on 4/24   Hypokalemia: Being aggressively supplemented and monitored.  Magnesium level normal on 4/22 03/31/2023: Potassium is 3.4 today.  Last magnesium level was 2.3.  Continue to replete potassium.   OSA: On CPAP   COPD: Not in exacerbation.  Uses 2L of at home.   Elevated alkaline phosphatase: Elevated GGT. liver enzymes normal.  Right upper quadrant ultrasound showed hepatic steatosis  Hyponatremia: Stable  Left upper extremity edema: Will check venous Doppler.  Likely from IV line  Debility/deconditioning/obesity: Morbidly obese patient.  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  Now has colorectal cancer, permanent A-fib, E. coli bacteremia.   Ambulates with the help of wheelchair.  Lives at a SNF, [facility] consulted.  Very poor quality of life due to several comorbidities.  Still full code.  Palliaitve care following.  We recommend continuing goals of care discussion and possibly transition to comfort care.Family and patient undecided yet  Nutrition Problem: Increased nutrient needs Etiology: chronic illness (COPD, CKD, CHF) Pressure Injury 03/25/23 Buttocks Right Stage 2 -  Partial thickness loss of dermis presenting as a shallow open injury with a red, pink wound bed without slough. (Active)  03/25/23 1522  Location: Buttocks  Location Orientation: Right  Staging: Stage 2 -  Partial thickness loss of dermis presenting as a shallow open injury with a red, pink wound bed without slough.  Wound Description (Comments):   Present on Admission: Yes  Dressing Type Foam - Lift dressing to assess site every shift 03/31/23 0800     Pressure Injury 03/25/23 Buttocks Right Stage 2 -  Partial thickness loss of dermis presenting as a shallow open injury with a red, pink wound bed without slough. (Active)  03/25/23 1523  Location: Buttocks  Location Orientation: Right  Staging: Stage 2 -  Partial thickness loss of dermis presenting as a shallow open injury with a red, pink wound bed without slough.  Wound Description (Comments):   Present on Admission: Yes  Dressing Type Foam - Lift dressing to assess site every shift 03/31/23 0800    DVT prophylaxis:SCDs Start: 03/25/23 0408     Code Status: Full Code  Family Communication:Called and discussed with son on phone on 4/25  Patient status: Inpatient  Patient is from : SNF  Anticipated discharge to: Not sure  Estimated DC date: Not sure   Consultants: GI, cardiology, palliative care, general surgery,oncology  Procedures: EGD/colonoscopy  Antimicrobials:  Anti-infectives (From admission, onward)    Start     Dose/Rate Route Frequency Ordered Stop   03/27/23 1000  cefTRIAXone  (ROCEPHIN) 2 g in sodium chloride 0.9 % 100 mL IVPB        2 g 200 mL/hr over 30 Minutes Intravenous Every 24 hours 03/27/23 0737     03/26/23 1600  cefTRIAXone (ROCEPHIN) 2 g in sodium chloride 0.9 % 100 mL IVPB  Status:  Discontinued        2 g 200 mL/hr over 30 Minutes Intravenous Every 24 hours 03/26/23 1510 03/27/23 0737   03/26/23 1600  azithromycin (ZITHROMAX) 500 mg in sodium chloride 0.9 % 250 mL IVPB  Status:  Discontinued        500 mg 250 mL/hr over 60 Minutes Intravenous Every 24 hours 03/26/23 1510 03/27/23 0736       Subjective: Patient seen and examined at bedside today.  Appears overall comfortable.  Not in distress.  Lying in bed.  Blood pressure remains soft with heart rate in the range of 100s.  Denies any worsening shortness of breath or cough today.  No new bloody bowel movement or black stools.  He was constantly saying that he wants to eat juice.  Knows that he is in the hospital, knows current year. Doesnot want to engage with goals of care conversation   Objective: Vitals:   03/31/23 1100 03/31/23 1104 03/31/23 1115 03/31/23 1130  BP: 106/65     Pulse: 100 (!) 112 (!) 107 (!) 101  Resp: 19  20 (!) 27  Temp:      TempSrc:      SpO2: 98% 98% 98% 98%  Weight:      Height:        Intake/Output Summary (Last 24 hours) at 03/31/2023 1154 Last data filed at 03/31/2023 1610 Gross per 24 hour  Intake 657.6 ml  Output 850 ml  Net -192.4 ml    Filed Weights   03/26/23 0500 03/27/23 0449 03/30/23 0434  Weight: 131.2 kg 127.8 kg 132.9 kg    Examination:  General exam: Very deconditioned, weak, morbidly obese HEENT: PERRL Respiratory system: Fine crackles bilaterally, diminished air sounds Cardiovascular system: Irregularly irregular rhythm Gastrointestinal system: Abdomen is obese, soft and nontender. Central nervous system: Alert and mostly oriented Extremities: Lower extremity edema.   Data Reviewed: I have personally reviewed following labs and  imaging studies  CBC: Recent Labs  Lab 03/26/23 0731 03/26/23 1652 03/27/23 0524 03/28/23 0326 03/29/23 0255 03/30/23 0811 03/31/23 0259  WBC 11.8*  --   --  10.4 9.8 11.1* 10.0  HGB 8.5*   < > 8.9* 9.1* 8.4* 9.5* 8.8*  HCT 29.1*   < > 30.1* 32.2* 29.8* 33.3* 30.7*  MCV 85.6  --   --  89.9 90.3 89.0 88.0  PLT 388  --   --  467* 448* 454* 426*   < > = values in this interval not displayed.    Basic Metabolic Panel: Recent Labs  Lab 03/25/23 0600 03/25/23 0859 03/25/23 0950 03/27/23 0523 03/27/23 0524 03/27/23 1734 03/28/23 0326 03/29/23 0255 03/29/23 1553 03/30/23 0811 03/31/23 0259  NA 123*  --    < >  --  137  --  136 133*  --  133* 132*  K 2.6*  --    < >  --  2.6*   < > 3.2* 2.9* 4.0 3.7 3.4*  CL 83*  --    < >  --  100  --  98 101  --  100 100  CO2 26  --    < >  --  26  --  25 22  --  22 21*  GLUCOSE 406*  --    < >  --  128*  --  112* 123*  --  121* 113*  BUN 49*  --    < >  --  35*  --  26* 20  --  17 17  CREATININE 1.69*  --    < >  --  1.11  --  1.24 1.12  --  1.23 1.03  CALCIUM 7.6*  --    < >  --  8.0*  --  8.2* 7.9*  --  8.2* 8.1*  MG 9.7* 2.4  --  2.3  --   --   --   --   --   --   --    < > = values in this interval not displayed.      Recent Results (from the past 240 hour(s))  MRSA Next Gen by PCR, Nasal     Status: None   Collection Time: 03/25/23  3:31 PM   Specimen: Nasal Mucosa; Nasal Swab  Result Value Ref Range Status   MRSA by PCR Next Gen NOT DETECTED NOT DETECTED Final    Comment: (NOTE) The GeneXpert MRSA Assay (FDA approved for NASAL specimens only), is one component of a comprehensive MRSA colonization surveillance program. It is not intended to diagnose MRSA infection nor to guide or monitor treatment for MRSA infections.  Test performance is not FDA approved in patients less than 57 years old. Performed at [facility], [address], [place], [place] [address]   Urine Culture (for pregnant, neutropenic or  urologic patients or patients with an indwelling urinary catheter)     Status: Abnormal   Collection Time: 03/26/23 10:39 AM   Specimen: Urine, Clean Catch  Result Value Ref Range Status   Specimen Description   Final    URINE, CLEAN CATCH Performed at [facility], [address], [place], [place] [address]    Special Requests   Final    NONE Performed at [facility], [address], [place], [place] [address]    Culture >=100,000 COLONIES/mL ESCHERICHIA COLI (A)  Final   Report Status 03/28/2023 FINAL  Final   Organism ID, Bacteria ESCHERICHIA COLI (A)  Final      Susceptibility   Escherichia coli - MIC*    AMPICILLIN >=32 RESISTANT Resistant     CEFAZOLIN <=4 SENSITIVE Sensitive     CEFEPIME <=0.12 SENSITIVE Sensitive     CEFTRIAXONE <=0.25 SENSITIVE Sensitive     CIPROFLOXACIN <=0.25 SENSITIVE Sensitive     GENTAMICIN <=1 SENSITIVE Sensitive     IMIPENEM <=0.25 SENSITIVE Sensitive     NITROFURANTOIN <=16 SENSITIVE Sensitive     TRIMETH/SULFA >=320 RESISTANT Resistant     AMPICILLIN/SULBACTAM 8 SENSITIVE Sensitive     PIP/TAZO <=4 SENSITIVE Sensitive     * >=100,000 COLONIES/mL ESCHERICHIA COLI  Culture, blood (Routine X 2) w Reflex to ID Panel     Status: Abnormal   Collection Time: 03/26/23  2:36 PM   Specimen: BLOOD  Result Value Ref Range Status   Specimen Description   Final    BLOOD BLOOD LEFT ARM Performed at [facility], [address], [place], [place] [address]    Special Requests   Final    AEROBIC BOTTLE ONLY Blood Culture adequate volume Performed at [facility], [address] [place], [place] [address]    Culture  Setup Time   Final    GRAM NEGATIVE RODS AEROBIC BOTTLE ONLY CRITICAL RESULT CALLED TO, READ BACK BY AND VERIFIED WITH: PHARMD N. GLOGOVAC 03/27/23 @ 0625 BY AB Performed at [facility], [address], [place], [place] [address]    Culture ESCHERICHIA COLI  (A)  Final   Report Status 03/29/2023 FINAL  Final   Organism ID, Bacteria ESCHERICHIA COLI  Final      Susceptibility   Escherichia coli - MIC*    AMPICILLIN >=32 RESISTANT Resistant     CEFEPIME <=0.12 SENSITIVE Sensitive     CEFTAZIDIME <=1 SENSITIVE Sensitive     CEFTRIAXONE <=0.25 SENSITIVE Sensitive     CIPROFLOXACIN <=0.25 SENSITIVE Sensitive     GENTAMICIN <=1 SENSITIVE Sensitive     IMIPENEM <=0.25 SENSITIVE Sensitive     TRIMETH/SULFA >=320 RESISTANT Resistant     AMPICILLIN/SULBACTAM 8 SENSITIVE Sensitive     PIP/TAZO <=4 SENSITIVE Sensitive     * ESCHERICHIA COLI  Blood Culture ID Panel (Reflexed)     Status: Abnormal   Collection Time: 03/26/23  2:36 PM  Result Value Ref Range Status   Enterococcus faecalis NOT DETECTED NOT DETECTED Final   Enterococcus Faecium NOT DETECTED NOT DETECTED Final   Listeria monocytogenes NOT DETECTED NOT DETECTED Final   Staphylococcus species NOT DETECTED NOT DETECTED Final   Staphylococcus aureus (BCID) NOT DETECTED NOT DETECTED Final   Staphylococcus  epidermidis NOT DETECTED NOT DETECTED Final   Staphylococcus lugdunensis NOT DETECTED NOT DETECTED Final   Streptococcus species NOT DETECTED NOT DETECTED Final   Streptococcus agalactiae NOT DETECTED NOT DETECTED Final   Streptococcus pneumoniae NOT DETECTED NOT DETECTED Final   Streptococcus pyogenes NOT DETECTED NOT DETECTED Final   A.calcoaceticus-baumannii NOT DETECTED NOT DETECTED Final   Bacteroides fragilis NOT DETECTED NOT DETECTED Final   Enterobacterales DETECTED (A) NOT DETECTED Final    Comment: Enterobacterales represent a large order of gram negative bacteria, not a single organism. CRITICAL RESULT CALLED TO, READ BACK BY AND VERIFIED WITH: PHARMD N. GLOGOVAC 03/27/23 @ 0625 BY AB    Enterobacter cloacae complex NOT DETECTED NOT DETECTED Final   Escherichia coli DETECTED (A) NOT DETECTED Final    Comment: CRITICAL RESULT CALLED TO, READ BACK BY AND VERIFIED WITH: PHARMD  N. GLOGOVAC 03/27/23 @ 0625 BY AB    Klebsiella aerogenes NOT DETECTED NOT DETECTED Final   Klebsiella oxytoca NOT DETECTED NOT DETECTED Final   Klebsiella pneumoniae NOT DETECTED NOT DETECTED Final   Proteus species NOT DETECTED NOT DETECTED Final   Salmonella species NOT DETECTED NOT DETECTED Final   Serratia marcescens NOT DETECTED NOT DETECTED Final   Haemophilus influenzae NOT DETECTED NOT DETECTED Final   Neisseria meningitidis NOT DETECTED NOT DETECTED Final   Pseudomonas aeruginosa NOT DETECTED NOT DETECTED Final   Stenotrophomonas maltophilia NOT DETECTED NOT DETECTED Final   Candida albicans NOT DETECTED NOT DETECTED Final   Candida auris NOT DETECTED NOT DETECTED Final   Candida glabrata NOT DETECTED NOT DETECTED Final   Candida krusei NOT DETECTED NOT DETECTED Final   Candida parapsilosis NOT DETECTED NOT DETECTED Final   Candida tropicalis NOT DETECTED NOT DETECTED Final   Cryptococcus neoformans/gattii NOT DETECTED NOT DETECTED Final   CTX-M ESBL NOT DETECTED NOT DETECTED Final   Carbapenem resistance IMP NOT DETECTED NOT DETECTED Final   Carbapenem resistance KPC NOT DETECTED NOT DETECTED Final   Carbapenem resistance NDM NOT DETECTED NOT DETECTED Final   Carbapenem resist OXA 48 LIKE NOT DETECTED NOT DETECTED Final   Carbapenem resistance VIM NOT DETECTED NOT DETECTED Final    Comment: Performed at [facility], [address], [place], [place] [address]  Culture, blood (Routine X 2) w Reflex to ID Panel     Status: None   Collection Time: 03/26/23  4:52 PM   Specimen: BLOOD  Result Value Ref Range Status   Specimen Description   Final    BLOOD BLOOD LEFT ARM Performed at [facility], [address], [place], [place] [address]    Special Requests   Final    AEROBIC BOTTLE ONLY Blood Culture adequate volume Performed at [facility], [address], [place], [place] [address]    Culture   Final    NO GROWTH 5  DAYS Performed at [facility], [address], [place], [place] [address]    Report Status 03/31/2023 FINAL  Final     Radiology Studies: VAS [place] UPPER EXTREMITY VENOUS DUPLEX  Result Date: 03/30/2023 UPPER VENOUS STUDY  Patient Name:  Charles Mckinney  Date of Exam:   03/30/2023 Medical Rec #: [id number]         Accession #:    [id number] Date of Birth: 08-Jan-1951          Patient Gender: M Patient Age:   42 years Exam Location:  [facility] Procedure:  VAS [place] UPPER EXTREMITY VENOUS DUPLEX Referring Phys: AMRIT ADHIKARI --------------------------------------------------------------------------------  Indications: R.O DVT Risk Factors: None identified. Limitations: Poor ultrasound/tissue interface, bandages and line. Comparison Study: No prior stuides. Performing Technologist: Chanda Busing RVT  Examination Guidelines: A complete evaluation includes B-mode imaging, spectral Doppler, color Doppler, and power Doppler as needed of all accessible portions of each vessel. Bilateral testing is considered an integral part of a complete examination. Limited examinations for reoccurring indications may be performed as noted.  Right Findings: +----------+------------+---------+-----------+----------+-------+ RIGHT     CompressiblePhasicitySpontaneousPropertiesSummary +----------+------------+---------+-----------+----------+-------+ Subclavian    Full       Yes       Yes                      +----------+------------+---------+-----------+----------+-------+  Left Findings: +----------+------------+---------+-----------+----------+-------+ LEFT      CompressiblePhasicitySpontaneousPropertiesSummary +----------+------------+---------+-----------+----------+-------+ IJV           Full       Yes       Yes                      +----------+------------+---------+-----------+----------+-------+ Subclavian    Full       Yes       Yes                       +----------+------------+---------+-----------+----------+-------+ Axillary      Full       Yes       Yes                      +----------+------------+---------+-----------+----------+-------+ Brachial      Full                                          +----------+------------+---------+-----------+----------+-------+ Radial        Full                                          +----------+------------+---------+-----------+----------+-------+ Ulnar         Full                                          +----------+------------+---------+-----------+----------+-------+ Cephalic      Full                                          +----------+------------+---------+-----------+----------+-------+ Basilic       Full                                          +----------+------------+---------+-----------+----------+-------+  Summary:  Right: No evidence of thrombosis in the subclavian.  Left: No evidence of deep vein thrombosis in the upper extremity. Findings consistent with acute superficial vein thrombosis involving the left cephalic vein.  *See table(s) above for measurements and observations.    Preliminary    CT CHEST ABDOMEN PELVIS W CONTRAST  Result Date: 03/29/2023 CLINICAL DATA:  Rectosigmoid mass found on colonoscopy suspicious for  adenocarcinoma, staging evaluation EXAM: CT CHEST, ABDOMEN, AND PELVIS WITH CONTRAST TECHNIQUE: Multidetector CT imaging of the chest, abdomen and pelvis was performed following the standard protocol during bolus administration of intravenous contrast. RADIATION DOSE REDUCTION: This exam was performed according to the departmental dose-optimization program which includes automated exposure control, adjustment of the mA and/or kV according to patient size and/or use of iterative reconstruction technique. CONTRAST:  OMNIPAQUE IOHEXOL 300 MG/ML  SOLN COMPARISON:  03/26/2023, 03/27/2023, 09/21/2015 FINDINGS: CT CHEST FINDINGS  Cardiovascular: The heart is enlarged without pericardial effusion. No evidence of thoracic aortic aneurysm or dissection. Atherosclerosis of the aorta and native coronary vessels. 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 Mediastinum/Nodes: No enlarged mediastinal, hilar, or axillary lymph nodes. Thyroid gland, trachea, and esophagus demonstrate no significant findings. Lungs/Pleura: There are small bilateral pleural effusions, left greater than right. There is dense bilateral lower lobe consolidation, left greater than right, likely representing a combination of airspace disease and atelectasis. Patchy nodular ground-glass airspace disease is seen within the left upper lobe, likely inflammatory or infectious. No pneumothorax. 5 mm right upper lobe pulmonary nodule is identified on image 44/5, indeterminate in this patient with a history of colon cancer. Close attention on follow-up is recommended. No other solid pulmonary nodules are identified. Musculoskeletal: There are no acute or destructive bony lesions. Reconstructed images demonstrate no additional findings. CT ABDOMEN PELVIS FINDINGS Hepatobiliary: The liver is unremarkable without focal abnormality identified on this single phase exam. No biliary duct dilation. The gallbladder is decompressed, with calcified gallstones identified. No evidence of acute cholecystitis. Pancreas: Unremarkable. No pancreatic ductal dilatation or surrounding inflammatory changes. Spleen: Normal in size without focal abnormality. Adrenals/Urinary Tract: There are bilateral nonobstructing renal calculi. There are at least 4 distinct calculi on the right, largest measuring 18 mm. At least 3 distinct calculi are seen on the left, largest measuring 9 mm. There are simple appearing right renal cysts which do not require specific imaging follow-up. There is a small amount of gas within the bladder lumen, as well as within a midpole calyx on the right. This could reflect gas introduced during  catheterization, though correlation with urinalysis is recommended to exclude superimposed infection. Bladder is decompressed, with nonspecific wall thickening. The adrenals are unremarkable. Stomach/Bowel: Long segment annular mass of the mid sigmoid colon is identified, extending at least 9 cm in length reference image 107/3, corresponding to the reported colonoscopy findings. No pericolonic fat stranding in this region. There is no bowel obstruction or ileus. Diverticulosis of the distal colon without diverticulitis. Normal appendix right lower quadrant. Vascular/Lymphatic: Atherosclerosis of the aorta and its branches. There are no pathologic lymph nodes identified within the abdomen or pelvis. Numerous subcentimeter lymph nodes within the retroperitoneum are stable and nonspecific. Reproductive: Prostate is unremarkable. Other: No free fluid or free intraperitoneal gas. Fat containing umbilical hernia unchanged. No bowel herniation. Musculoskeletal: No acute or destructive bony lesions. Reconstructed images demonstrate no additional findings. IMPRESSION: 1. Long segment annular mass of the sigmoid colon compatible with neoplasm identified at colonoscopy. 2. Solitary right upper lobe 5 mm pulmonary nodule, nonspecific. This could be an atypical presentation for a solitary metastasis. Continued attention on follow-up is recommended. 3. Otherwise no evidence of intrathoracic, intra-abdominal, or intrapelvic metastases. 4. Dense areas of consolidation within the bilateral lower lobes, left greater than right, as well as patchy nodular airspace disease within the left upper lobe. Findings likely represent a combination of infection/inflammation and atelectasis. 5. Trace bilateral pleural effusions, left greater than right. 6. Cardiomegaly.  7. Colonic diverticulosis without diverticulitis. 8. Bilateral nonobstructing renal calculi. 9. Small amount of gas within the bladder lumen and a midpole right renal calyx,  likely representing gas introduced during catheterization. Please correlate with urinalysis to exclude superimposed infection. 10.  Aortic Atherosclerosis (ICD10-I70.0). Electronically Signed   By: Sharlet Salina M.D.   On: 03/29/2023 15:52    Scheduled Meds:  amiodarone  200 mg Oral Daily   Chlorhexidine Gluconate Cloth  6 each Topical Daily   feeding supplement  237 mL Oral BID BM   levothyroxine  100 mcg Oral Q0600   metoprolol tartrate  25 mg Oral BID   mometasone-formoterol  2 puff Inhalation BID   pantoprazole (PROTONIX) IV  40 mg Intravenous Q12H   sodium chloride flush  3 mL Intravenous Q12H   Continuous Infusions:  cefTRIAXone (ROCEPHIN)  IV Stopped (03/31/23 0940)     LOS: 6 days   Spent: 55 minutes.  Barnetta Chapel, MD [facility] P4/26/2024, 11:54 AM

## 2023-03-31 NOTE — Progress Notes (Signed)
I was present for multidisciplinary meeting with palliative care, oncology, and patient and family yesterday afternoon. Son Charles Mckinney and ex wife (for a portion) were present for discussion. Discussed high surgical risks and the concern given baseline functional status and comborbidities for greater risk for harm than benefit from invasive surgical intervention. Plan for ongoing palliative/hospice care. Appreciate palliative and oncology care. Surgery will remain available as needed.  Eric Form, [facility] Surgery 03/31/2023, 7:41 AM Please see Amion for pager number during day hours 7:00am-4:30pm

## 2023-03-31 NOTE — Progress Notes (Signed)
Rounding Note    Patient Name: Charles Mckinney Date of Encounter: 03/31/2023  [facility] Cardiologist: Bryan Lemma, MD   Subjective   No acute overnight events. No significant changes compared to yesterday. Chronic shortness of breath is stable. He continues to have diffuse body aches. He is unaware of his atrial fibrillation with no palpitations. Rates mostly in the 90s to 110s at rest but will increase with minimal activity.  Inpatient Medications    Scheduled Meds:  Chlorhexidine Gluconate Cloth  6 each Topical Daily   feeding supplement  237 mL Oral BID BM   levothyroxine  100 mcg Oral Q0600   metoprolol tartrate  12.5 mg Oral BID   mometasone-formoterol  2 puff Inhalation BID   pantoprazole (PROTONIX) IV  40 mg Intravenous Q12H   sodium chloride flush  3 mL Intravenous Q12H   Continuous Infusions:  amiodarone 30 mg/hr (03/31/23 0018)   cefTRIAXone (ROCEPHIN)  IV Stopped (03/30/23 0955)   PRN Meds: acetaminophen **OR** acetaminophen, albuterol, HYDROmorphone (DILAUDID) injection, ondansetron (ZOFRAN) IV, mouth rinse, oxyCODONE   Vital Signs    Vitals:   03/30/23 2357 03/31/23 0017 03/31/23 0346 03/31/23 0516  BP:  115/63  123/61  Pulse:  87  93  Resp:  (!) 23  (!) 21  Temp: 98.2 F (36.8 C)  98.7 F (37.1 C)   TempSrc: Oral  Oral   SpO2:  97%  97%  Weight:      Height:        Intake/Output Summary (Last 24 hours) at 03/31/2023 0721 Last data filed at 03/31/2023 0017 Gross per 24 hour  Intake 351.04 ml  Output 650 ml  Net -298.96 ml      03/30/2023    4:34 AM 03/27/2023    4:49 AM 03/26/2023    5:00 AM  Last 3 Weights  Weight (lbs) 292 lb 15.9 oz 281 lb 12 oz 289 lb 3.9 oz  Weight (kg) 132.9 kg 127.8 kg 131.2 kg      Telemetry    Atrial fibrillation with occasional PVCs. Rates mostly in the 90s to 110s at rest but will increase with minimal activity (suspect this is due to profound deconditioning) - Personally Reviewed  ECG    No  new ECG tracing today. - Personally Reviewed  Physical Exam   GEN: Morbidly obese Caucasian male in no acute distress.   Neck: JVD difficult to assess due to body habitus. Cardiac: Mildly tachycardic with irregularly irregular rate. No murmurs, rubs, or gallops. Radial pulses 2+ and equal bilaterally. Respiratory: On 2L of O2 via nasal cannula. No increased work of breathing. Diminished breath sounds throughout. Occasional very faint scattered wheeze. No rhonchi or rales appreciated.   GI: Soft, non-distended, and non-tender. MS: Trace edema of bilateral lower extremities. Erythema noted on bilateral lower extremities. No deformity. Skin: Warm and dry. Neuro:  No focal deficits. Psych: Flat affect. Responds appropriately.  Labs    High Sensitivity Troponin:  No results for input(s): "TROPONINIHS" in the last 720 hours.   Chemistry Recent Labs  Lab 03/25/23 0600 03/25/23 0859 03/25/23 0950 03/26/23 0731 03/27/23 0523 03/27/23 0524 03/27/23 1734 03/28/23 0326 03/29/23 0255 03/29/23 1553 03/30/23 0811 03/31/23 0259  NA 123*  --    < > 135  --  137  --  136 133*  --  133* 132*  K 2.6*  --    < > 3.1*  --  2.6*   < > 3.2* 2.9* 4.0 3.7 3.4*  CL 83*  --    < > 100  --  100  --  98 101  --  100 100  CO2 26  --    < > 26  --  26  --  25 22  --  22 21*  GLUCOSE 406*  --    < > 121*  --  128*  --  112* 123*  --  121* 113*  BUN 49*  --    < > 42*  --  35*  --  26* 20  --  17 17  CREATININE 1.69*  --    < > 1.37*  --  1.11  --  1.24 1.12  --  1.23 1.03  CALCIUM 7.6*  --    < > 7.9*  --  8.0*  --  8.2* 7.9*  --  8.2* 8.1*  MG 9.7* 2.4  --   --  2.3  --   --   --   --   --   --   --   PROT 5.7*  --   --  5.3*  --  5.8*  --  5.7*  --   --   --   --   ALBUMIN 2.1*  --   --  1.7*  --  2.0*  --  2.2*  --   --   --   --   AST 35  --   --  29  --  23  --  22  --   --   --   --   ALT 28  --   --  26  --  23  --  22  --   --   --   --   ALKPHOS 366*  --   --  334*  --  289*  --  260*  --   --    --   --   BILITOT 0.7  --   --  0.7  --  0.6  --  0.6  --   --   --   --   GFRNONAA 43*  --    < > 55*  --  >60  --  >60 >60  --  >60 >60  ANIONGAP 14  --    < > 9  --  11  --  13 10  --  11 11   < > = values in this interval not displayed.    Lipids No results for input(s): "CHOL", "TRIG", "HDL", "LABVLDL", "LDLCALC", "CHOLHDL" in the last 168 hours.  Hematology Recent Labs  Lab 03/29/23 0255 03/30/23 0811 03/31/23 0259  WBC 9.8 11.1* 10.0  RBC 3.30* 3.74* 3.49*  HGB 8.4* 9.5* 8.8*  HCT 29.8* 33.3* 30.7*  MCV 90.3 89.0 88.0  MCH 25.5* 25.4* 25.2*  MCHC 28.2* 28.5* 28.7*  RDW 16.7* 16.5* 16.8*  PLT 448* 454* 426*   Thyroid No results for input(s): "TSH", "FREET4" in the last 168 hours.  BNPNo results for input(s): "BNP", "PROBNP" in the last 168 hours.  DDimer No results for input(s): "DDIMER" in the last 168 hours.   Radiology    VAS [place] UPPER EXTREMITY VENOUS DUPLEX  Result Date: 03/30/2023 UPPER VENOUS STUDY  Patient Name:  Charles Mckinney  Date of Exam:   03/30/2023 Medical Rec #: [id number]         Accession #:    [id number] Date of Birth: 1951-05-03  Patient Gender: M Patient Age:   72 years Exam Location:  [facility] Procedure:      VAS [place] UPPER EXTREMITY VENOUS DUPLEX Referring Phys: AMRIT ADHIKARI --------------------------------------------------------------------------------  Indications: R.O DVT Risk Factors: None identified. Limitations: Poor ultrasound/tissue interface, bandages and line. Comparison Study: No prior stuides. Performing Technologist: Chanda Busing RVT  Examination Guidelines: A complete evaluation includes B-mode imaging, spectral Doppler, color Doppler, and power Doppler as needed of all accessible portions of each vessel. Bilateral testing is considered an integral part of a complete examination. Limited examinations for reoccurring indications may be performed as noted.  Right Findings:  +----------+------------+---------+-----------+----------+-------+ RIGHT     CompressiblePhasicitySpontaneousPropertiesSummary +----------+------------+---------+-----------+----------+-------+ Subclavian    Full       Yes       Yes                      +----------+------------+---------+-----------+----------+-------+  Left Findings: +----------+------------+---------+-----------+----------+-------+ LEFT      CompressiblePhasicitySpontaneousPropertiesSummary +----------+------------+---------+-----------+----------+-------+ IJV           Full       Yes       Yes                      +----------+------------+---------+-----------+----------+-------+ Subclavian    Full       Yes       Yes                      +----------+------------+---------+-----------+----------+-------+ Axillary      Full       Yes       Yes                      +----------+------------+---------+-----------+----------+-------+ Brachial      Full                                          +----------+------------+---------+-----------+----------+-------+ Radial        Full                                          +----------+------------+---------+-----------+----------+-------+ Ulnar         Full                                          +----------+------------+---------+-----------+----------+-------+ Cephalic      Full                                          +----------+------------+---------+-----------+----------+-------+ Basilic       Full                                          +----------+------------+---------+-----------+----------+-------+  Summary:  Right: No evidence of thrombosis in the subclavian.  Left: No evidence of deep vein thrombosis in the upper extremity. Findings consistent with acute superficial vein thrombosis involving the left cephalic vein.  *See table(s) above for measurements and observations.    Preliminary  CT CHEST ABDOMEN PELVIS W  CONTRAST  Result Date: 03/29/2023 CLINICAL DATA:  Rectosigmoid mass found on colonoscopy suspicious for adenocarcinoma, staging evaluation EXAM: CT CHEST, ABDOMEN, AND PELVIS WITH CONTRAST TECHNIQUE: Multidetector CT imaging of the chest, abdomen and pelvis was performed following the standard protocol during bolus administration of intravenous contrast. RADIATION DOSE REDUCTION: This exam was performed according to the departmental dose-optimization program which includes automated exposure control, adjustment of the mA and/or kV according to patient size and/or use of iterative reconstruction technique. CONTRAST:  OMNIPAQUE IOHEXOL 300 MG/ML  SOLN COMPARISON:  03/26/2023, 03/27/2023, 09/21/2015 FINDINGS: CT CHEST FINDINGS Cardiovascular: The heart is enlarged without pericardial effusion. No evidence of thoracic aortic aneurysm or dissection. Atherosclerosis of the aorta and native coronary vessels. 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 Mediastinum/Nodes: No enlarged mediastinal, hilar, or axillary lymph nodes. Thyroid gland, trachea, and esophagus demonstrate no significant findings. Lungs/Pleura: There are small bilateral pleural effusions, left greater than right. There is dense bilateral lower lobe consolidation, left greater than right, likely representing a combination of airspace disease and atelectasis. Patchy nodular ground-glass airspace disease is seen within the left upper lobe, likely inflammatory or infectious. No pneumothorax. 5 mm right upper lobe pulmonary nodule is identified on image 44/5, indeterminate in this patient with a history of colon cancer. Close attention on follow-up is recommended. No other solid pulmonary nodules are identified. Musculoskeletal: There are no acute or destructive bony lesions. Reconstructed images demonstrate no additional findings. CT ABDOMEN PELVIS FINDINGS Hepatobiliary: The liver is unremarkable without focal abnormality identified on this single phase exam.  No biliary duct dilation. The gallbladder is decompressed, with calcified gallstones identified. No evidence of acute cholecystitis. Pancreas: Unremarkable. No pancreatic ductal dilatation or surrounding inflammatory changes. Spleen: Normal in size without focal abnormality. Adrenals/Urinary Tract: There are bilateral nonobstructing renal calculi. There are at least 4 distinct calculi on the right, largest measuring 18 mm. At least 3 distinct calculi are seen on the left, largest measuring 9 mm. There are simple appearing right renal cysts which do not require specific imaging follow-up. There is a small amount of gas within the bladder lumen, as well as within a midpole calyx on the right. This could reflect gas introduced during catheterization, though correlation with urinalysis is recommended to exclude superimposed infection. Bladder is decompressed, with nonspecific wall thickening. The adrenals are unremarkable. Stomach/Bowel: Long segment annular mass of the mid sigmoid colon is identified, extending at least 9 cm in length reference image 107/3, corresponding to the reported colonoscopy findings. No pericolonic fat stranding in this region. There is no bowel obstruction or ileus. Diverticulosis of the distal colon without diverticulitis. Normal appendix right lower quadrant. Vascular/Lymphatic: Atherosclerosis of the aorta and its branches. There are no pathologic lymph nodes identified within the abdomen or pelvis. Numerous subcentimeter lymph nodes within the retroperitoneum are stable and nonspecific. Reproductive: Prostate is unremarkable. Other: No free fluid or free intraperitoneal gas. Fat containing umbilical hernia unchanged. No bowel herniation. Musculoskeletal: No acute or destructive bony lesions. Reconstructed images demonstrate no additional findings. IMPRESSION: 1. Long segment annular mass of the sigmoid colon compatible with neoplasm identified at colonoscopy. 2. Solitary right upper lobe 5  mm pulmonary nodule, nonspecific. This could be an atypical presentation for a solitary metastasis. Continued attention on follow-up is recommended. 3. Otherwise no evidence of intrathoracic, intra-abdominal, or intrapelvic metastases. 4. Dense areas of consolidation within the bilateral lower lobes, left greater than right, as well as patchy nodular airspace disease within the left upper  lobe. Findings likely represent a combination of infection/inflammation and atelectasis. 5. Trace bilateral pleural effusions, left greater than right. 6. Cardiomegaly. 7. Colonic diverticulosis without diverticulitis. 8. Bilateral nonobstructing renal calculi. 9. Small amount of gas within the bladder lumen and a midpole right renal calyx, likely representing gas introduced during catheterization. Please correlate with urinalysis to exclude superimposed infection. 10.  Aortic Atherosclerosis (ICD10-I70.0). Electronically Signed   By: Sharlet Salina M.D.   On: 03/29/2023 15:52    Cardiac Studies   Echocardiogram 10/18/2018: Study Conclusions: - Procedure narrative: Technically difficult study with poor echo    windows. Definity contrast not given.  - Left ventricle: The cavity size was normal. Wall thickness was    increased in a pattern of mild LVH. Systolic function was    moderately to severely reduced. The estimated ejection fraction    was in the range of 30% to 35%. Diffuse hypokinesis. The study is    not technically sufficient to allow evaluation of LV diastolic    function.  - Mitral valve: Mildly thickened leaflets . There was mild    regurgitation.  - Left atrium: The atrium was normal in size.  - Atrial septum: No defect or patent foramen ovale was identified.  - Inferior vena cava: The vessel was normal in size. The    respirophasic diameter changes were in the normal range (>= 50%),    consistent with normal central venous pressure.   Impressions:  - Technically difficult study. Compared to a  prior study in 2018,    the LVEF is unchanged at 30-35% wtih global hypokinesis.   Patient Profile     72 y.o. 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 He was was hypotensive on arrival and blood cultures were also positive for E.coli. He was transfused with PRBCs and GI was consulted. Cardiology was consulted on 03/27/2023 for atrial fibrillation with RVR.   Assessment & Plan    Permanent Atrial Fibrillation  Patient has a history of permanent atrial fibrillation. He was admitted with acute GI bleed and E.coli bacteremia and was noted to be in RVR. He was also severely hypokalemic on admission with potassium of 2.2. EF 30-35% on last Echo in 2019. He was started on IV Amiodarone given soft BP. - Rates reasonably well controlled - mostly in the 90s to 110s but will increase with minimal activity (suspect this is due to profound deconditioning). - Currently on IV Amiodarone for now. Given no plans for surgery, suspect we can transition this to PO. Will discuss with MD. - Continue Lopressor 12.5mg  twice daily. BP looks better this morning. Will increase Lopressor to 25mg  twice daily (will place parameters to hold if systolic BP <95).  - CHA2DS2-VASc = 6 (CAD, CHF, TIA x2, HTN, age). On chronic anticoagulation with Eliquis at home but this was held on admission given GI bleed requiring blood transfusions. Will defer when it is safe to trial Heparin to GI/ General Surgery.    CAD s/p CABG History of CABG x4 in  2016. Myoview in 05/2017 showed a  large infarct involving the anterior, apical, and septal wall but no ischemia. - He continues to have diffuse body aches but nothing that sounds like angina. - Not on aspirin at home given need for full anticoagulation and now GI bleed. - Continue low dose beta-blocker.  - Home Pravastatin held on admission. Can resume at discharge.   Chronic Combined CHF Ischemic Cardiomyopathy Last Echo in 2019 showed LVEF of 30-35% with diffuse hypokinesis. He has been receiving PRN Lasix during admission - last dose yesterday. Net positive 1.5 L this admission.  - Does not appear significantly volume overloaded on exam. - No additional IV Lasix needed at this time. - GDMT limited in the past due to CKD. However, renal function has now improved. GDMT now being limited more by soft BP.  - Continue Lopressor as above.  - Will hold on addition GDMT to allow for more rate control if needed. In addition, hospice has been recommended and patient's prognosis is expected to be 6 months or less with newly diagnosed colon cancer (patient not felt to be a candidate for surgery or chemotherapy as both were felt to likely cause more harm than good). Therefore, I don't think we need to be over-aggressive with his GDMT. - Continue to monitor volume status closely.   Hypertension Patient has a history of hypertension but was hypotenisve on arrival with systolic BP in the 80s.  - BP still soft at times with systolic BP in the 90s but has looked better over the last 24 hours. - Continue Lopressor as above.    Acute on CKD Stage III Baseline creatinine around 1.5 in 2020. Creatinine 1.90 on admission. This was in the setting of GI bleed and hypotension. Improved with IV fluids and blood transfusions.  - Creatinine 1.03 today.   Hypokalemia Severely hypokalemic on admission with potassium of 2.2. He has received aggressive supplementation and potassium as waxed and waned. - Potassium 3.4  today.  - Will give addition dose of Kcl 40 mEq.   GI Bleeding Acute Anemia Colon Cancer Patient presented with blood in his adult depends for 1 week (but had been refusing transport to the ED). Hemoglobin 7.4 on admission. S/p 2 units of PRBCs. 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 Tumor came back positive for cancer. General surgery and Palliative Care have been consulted. - Hemoglobin 8.8 today. - Continue PPI.  - Management per GI and General Surgery. Will defer to them on when it is OK to restart Eliquis. - Unclear on whether patient is a candidate for surgery right now with all of his other comorbidities. Chest/ Abdominal/ Pelvic CT showed a solitary right upper lobe 5 mm pulmonary nodule that was non-specific but could be an atypical presentation for a solitary metastasis. Otherwise, no evidence of intrathoracic, intra-abdominal, or intrapelvic metastases.  After family meeting with General Surgery, Oncology, and Palliative Care on 03/30/2023, hospice was recommended as patient is not felt to be a candidate for surgery or chemotherapy (both felt to be likely do more harm than good given multiple comorbidities including being bedbound). Prognosis is estimated to be 6 months or less.     Otherwise, per primary team: - E.coli bacteremia  - COPD with chronic hypoxic respiratory failure - AAA - Hypothyroidism  - Obstructive sleep apnea -  Elevated alkaline phosphatase - Hyperlipidemia - Deconditioning  For questions or updates, please contact [facility] Please consult [email or web address] for contact info under        Signed, Corrin Parker, PA-C  03/31/2023, 7:21 AM

## 2023-04-01 DIAGNOSIS — K922 Gastrointestinal hemorrhage, unspecified: Secondary | ICD-10-CM | POA: Diagnosis not present

## 2023-04-01 MED ORDER — FUROSEMIDE 10 MG/ML IJ SOLN
80.0000 mg | Freq: Two times a day (BID) | INTRAMUSCULAR | Status: AC
  Administered 2023-04-01 – 2023-04-02 (×2): 80 mg via INTRAVENOUS
  Filled 2023-04-01 (×2): qty 8

## 2023-04-01 NOTE — Progress Notes (Signed)
PT states he does not use cpap. °

## 2023-04-01 NOTE — Plan of Care (Signed)
  Problem: Activity: Goal: Risk for activity intolerance will decrease Outcome: Not Progressing   Problem: Clinical Measurements: Goal: Diagnostic test results will improve Outcome: Progressing   Problem: Pain Managment: Goal: General experience of comfort will improve Outcome: Progressing

## 2023-04-01 NOTE — Progress Notes (Signed)
PROGRESS NOTE  Charles Mckinney  ZOX:[id number] DOB: December 10, 1950 DOA: 03/24/2023 PCP: Virl Cagey, MD   Brief Narrative: Patient is a 72 year old male from [facility] with history of hypertension, COPD, chronic hypoxic respiratory failure on 2 L of oxygen , CKD stage IIIb, atrial fibrillation on Eliquis, combined systolic/diastolic CHF, OSA, AAA who presents to the emergency department with complaint of dark blood in the stool multiple times.  He noticed dark red blood in diaper multiple times over the past week.  Takes Eliquis.  On presentation, lab work showed potassium of 2.2, hemoglobin 7.4, creatinine of 1.9 . He denies any nausea, abdomen pain or vomiting.  Blood pressure was soft on presentation.  Patient was admitted for the management of  GI bleed, GI consulted. Hospital course remarkable for persistent A-fib with RVR, hypokalemia, melanotic stools.  Currently on amiodarone.  Cardiology following.  Colonoscopy showed large rectosigmoid partially obstructing mass ,likely malignant.  Palliative care also consulted for goals of care.  General surgery following.  Oncology also consulted today as per general surgery request.  03/31/2023: Patient seen.  Patient is stable for discharge.  Awaiting discharge to facility.  Patient is not keen on being discharged to [facility].  04/01/2023: Awaiting disposition.  Start patient on IV diuretics.  Assessment & Plan:  Acute lower GI bleed: Presented with hematochezia, dark stool.  Hemoglobin of 7.4 on presentation.  Given 2 units of blood transfusion.  GI consulted.  Hemoglobin stable in the range of 9 this morning. On Protonix. EGD showed nonbleeding gastric ulcer with a clean ulcer base.  Colonoscopy showed large rectosigmoid partially obstructing mass .On full liquid diet 03/31/2023: GI bleed has resolved.  Rectosigmoid adenocarcinoma: Colonoscopy showed large rectosigmoid partially obstructing mass .biopsy positive for adenocarcinoma. general  surgery consulted, requesting oncology input. Message sent to  Dr. Leonides Schanz. CT chest/abdomen/pelvis with contrast showed solitary right upper lobe 5 mm pulmonary nodule  03/31/2023: Very not a surgical candidate.  Palliative care team has been consulted.  Persistent A-fib with RVR: On Eliquis at home and metoprolol for rate control.   Eliquis on hold. Blood pressure was soft on 4/22 and he was not rapid A-fib so he was sent down to stepdown and started on amiodarone drip.  Cardiology following.  BP remains high and heart rate remains more than 100 most of the time. 03/31/2023: Heart rate is currently rate controlled.  Patient is not on oral amiodarone.  Gram-negative bacteremia: Had mild leukocytosis, became febrile on 4/21.  Urine culture/blood cultures showing E. coli.  Also has mild erythema bilateral lower extremities.  Currently on ceftriaxone.  03/31/2023: Follow final cultures.   AKI in CKD stage IIIa: Baseline creatinine was 1.5 ,3 years ago . Presented with creatinine of 1.9.  Most likely prerenal in the setting of blood loss.  antihypertensives on hold.  Kidney function has normalized.  Combined systolic/diastolic CHF: Echo done on 10/2018 showed EF of 30 to 35%, diffuse hypokinesis. Has chronic edema bilateral lower extremities.  Cardiology consulted and following.  Given a dose of Lasix IV 40 mg on 4/24 04/01/2023: IV Lasix.   Hypokalemia: Being aggressively supplemented and monitored.  Magnesium level normal on 4/22 03/31/2023: Potassium is 3.4 today.  Last magnesium level was 2.3.  Continue to replete potassium.   OSA: On CPAP   COPD: Not in exacerbation.  Uses 2L of at home.   Elevated alkaline phosphatase: Elevated GGT. liver enzymes normal.  Right upper quadrant ultrasound showed hepatic steatosis  Hyponatremia: Stable  Left  upper extremity edema: Will check venous Doppler.  Likely from IV line   Debility/deconditioning/obesity: Morbidly obese patient.  Cannot walk due to  severe arthritis of bilateral knee.  Now has colorectal cancer, permanent A-fib, E. coli bacteremia.  Ambulates with the help of wheelchair.  Lives at a SNF, [facility] consulted.  Very poor quality of life due to several comorbidities.  Still full code.  Palliaitve care following.  We recommend continuing goals of care discussion and possibly transition to comfort care.Family and patient undecided yet  Nutrition Problem: Increased nutrient needs Etiology: chronic illness (COPD, CKD, CHF) Pressure Injury 03/25/23 Buttocks Right Stage 2 -  Partial thickness loss of dermis presenting as a shallow open injury with a red, pink wound bed without slough. (Active)  03/25/23 1522  Location: Buttocks  Location Orientation: Right  Staging: Stage 2 -  Partial thickness loss of dermis presenting as a shallow open injury with a red, pink wound bed without slough.  Wound Description (Comments):   Present on Admission: Yes  Dressing Type Foam - Lift dressing to assess site every shift 03/31/23 2053     Pressure Injury 03/25/23 Buttocks Right Stage 2 -  Partial thickness loss of dermis presenting as a shallow open injury with a red, pink wound bed without slough. (Active)  03/25/23 1523  Location: Buttocks  Location Orientation: Right  Staging: Stage 2 -  Partial thickness loss of dermis presenting as a shallow open injury with a red, pink wound bed without slough.  Wound Description (Comments):   Present on Admission: Yes  Dressing Type Foam - Lift dressing to assess site every shift 03/31/23 1500    DVT prophylaxis:SCDs Start: 03/25/23 0408     Code Status: Full Code  Family Communication:Called and discussed with son on phone on 4/25  Patient status: Inpatient  Patient is from : SNF  Anticipated discharge to: Not sure  Estimated DC date: Not sure   Consultants: GI, cardiology, palliative care, general surgery,oncology  Procedures: EGD/colonoscopy  Antimicrobials:  Anti-infectives (From  admission, onward)    Start     Dose/Rate Route Frequency Ordered Stop   03/27/23 1000  cefTRIAXone (ROCEPHIN) 2 g in sodium chloride 0.9 % 100 mL IVPB        2 g 200 mL/hr over 30 Minutes Intravenous Every 24 hours 03/27/23 0737     03/26/23 1600  cefTRIAXone (ROCEPHIN) 2 g in sodium chloride 0.9 % 100 mL IVPB  Status:  Discontinued        2 g 200 mL/hr over 30 Minutes Intravenous Every 24 hours 03/26/23 1510 03/27/23 0737   03/26/23 1600  azithromycin (ZITHROMAX) 500 mg in sodium chloride 0.9 % 250 mL IVPB  Status:  Discontinued        500 mg 250 mL/hr over 60 Minutes Intravenous Every 24 hours 03/26/23 1510 03/27/23 0736       Subjective: -Continues to report shortness of breath.  Objective: Vitals:   04/01/23 0800 04/01/23 0915 04/01/23 1159 04/01/23 1550  BP: 126/71  109/77 109/76  Pulse: 65 (!) 140 98 79  Resp: 20  16 19   Temp: 98.2 F (36.8 C)  98.1 F (36.7 C) 98.3 F (36.8 C)  TempSrc: Oral  Oral Oral  SpO2:  98% 98% 98%  Weight:      Height:        Intake/Output Summary (Last 24 hours) at 04/01/2023 1731 Last data filed at 04/01/2023 1204 Gross per 24 hour  Intake 240 ml  Output 300  ml  Net -60 ml    Filed Weights   03/27/23 0449 03/30/23 0434 04/01/23 0500  Weight: 127.8 kg 132.9 kg 131.6 kg    Examination:  General exam: Obese.  Seems volume overloaded. HEENT: PERRL Respiratory system: Fine crackles bilaterally, diminished air sounds Cardiovascular system: Irregularly irregular rhythm Gastrointestinal system: Abdomen is obese, soft and nontender. Central nervous system: Alert and mostly oriented Extremities: Lower extremity edema.   Data Reviewed: I have personally reviewed following labs and imaging studies  CBC: Recent Labs  Lab 03/26/23 0731 03/26/23 1652 03/27/23 0524 03/28/23 0326 03/29/23 0255 03/30/23 0811 03/31/23 0259  WBC 11.8*  --   --  10.4 9.8 11.1* 10.0  HGB 8.5*   < > 8.9* 9.1* 8.4* 9.5* 8.8*  HCT 29.1*   < > 30.1* 32.2*  29.8* 33.3* 30.7*  MCV 85.6  --   --  89.9 90.3 89.0 88.0  PLT 388  --   --  467* 448* 454* 426*   < > = values in this interval not displayed.    Basic Metabolic Panel: Recent Labs  Lab 03/27/23 0523 03/27/23 0524 03/27/23 1734 03/28/23 0326 03/29/23 0255 03/29/23 1553 03/30/23 0811 03/31/23 0259  NA  --  137  --  136 133*  --  133* 132*  K  --  2.6*   < > 3.2* 2.9* 4.0 3.7 3.4*  CL  --  100  --  98 101  --  100 100  CO2  --  26  --  25 22  --  22 21*  GLUCOSE  --  128*  --  112* 123*  --  121* 113*  BUN  --  35*  --  26* 20  --  17 17  CREATININE  --  1.11  --  1.24 1.12  --  1.23 1.03  CALCIUM  --  8.0*  --  8.2* 7.9*  --  8.2* 8.1*  MG 2.3  --   --   --   --   --   --   --    < > = values in this interval not displayed.      Recent Results (from the past 240 hour(s))  MRSA Next Gen by PCR, Nasal     Status: None   Collection Time: 03/25/23  3:31 PM   Specimen: Nasal Mucosa; Nasal Swab  Result Value Ref Range Status   MRSA by PCR Next Gen NOT DETECTED NOT DETECTED Final    Comment: (NOTE) The GeneXpert MRSA Assay (FDA approved for NASAL specimens only), is one component of a comprehensive MRSA colonization surveillance program. It is not intended to diagnose MRSA infection nor to guide or monitor treatment for MRSA infections. Test performance is not FDA approved in patients less than 17 years old. Performed at [facility], [address], [place], [place] [address]   Urine Culture (for pregnant, neutropenic or urologic patients or patients with an indwelling urinary catheter)     Status: Abnormal   Collection Time: 03/26/23 10:39 AM   Specimen: Urine, Clean Catch  Result Value Ref Range Status   Specimen Description   Final    URINE, CLEAN CATCH Performed at [facility], [address], [place], [place] [address]    Special Requests   Final    NONE Performed at [facility], [address],  [place], [place] [address]    Culture >=100,000 COLONIES/mL ESCHERICHIA COLI (A)  Final  Report Status 03/28/2023 FINAL  Final   Organism ID, Bacteria ESCHERICHIA COLI (A)  Final      Susceptibility   Escherichia coli - MIC*    AMPICILLIN >=32 RESISTANT Resistant     CEFAZOLIN <=4 SENSITIVE Sensitive     CEFEPIME <=0.12 SENSITIVE Sensitive     CEFTRIAXONE <=0.25 SENSITIVE Sensitive     CIPROFLOXACIN <=0.25 SENSITIVE Sensitive     GENTAMICIN <=1 SENSITIVE Sensitive     IMIPENEM <=0.25 SENSITIVE Sensitive     NITROFURANTOIN <=16 SENSITIVE Sensitive     TRIMETH/SULFA >=320 RESISTANT Resistant     AMPICILLIN/SULBACTAM 8 SENSITIVE Sensitive     PIP/TAZO <=4 SENSITIVE Sensitive     * >=100,000 COLONIES/mL ESCHERICHIA COLI  Culture, blood (Routine X 2) w Reflex to ID Panel     Status: Abnormal   Collection Time: 03/26/23  2:36 PM   Specimen: BLOOD  Result Value Ref Range Status   Specimen Description   Final    BLOOD BLOOD LEFT ARM Performed at [facility], [address] [place], [place] [address]    Special Requests   Final    AEROBIC BOTTLE ONLY Blood Culture adequate volume Performed at [facility], [address] [place], [place] [address]    Culture  Setup Time   Final    GRAM NEGATIVE RODS AEROBIC BOTTLE ONLY CRITICAL RESULT CALLED TO, READ BACK BY AND VERIFIED WITH: PHARMD N. GLOGOVAC 03/27/23 @ 0625 BY AB Performed at [facility], [address] [place], [place] [address]    Culture ESCHERICHIA COLI (A)  Final   Report Status 03/29/2023 FINAL  Final   Organism ID, Bacteria ESCHERICHIA COLI  Final      Susceptibility   Escherichia coli - MIC*    AMPICILLIN >=32 RESISTANT Resistant     CEFEPIME <=0.12 SENSITIVE Sensitive     CEFTAZIDIME <=1 SENSITIVE Sensitive     CEFTRIAXONE <=0.25 SENSITIVE Sensitive     CIPROFLOXACIN <=0.25 SENSITIVE Sensitive     GENTAMICIN <=1 SENSITIVE Sensitive     IMIPENEM <=0.25 SENSITIVE Sensitive      TRIMETH/SULFA >=320 RESISTANT Resistant     AMPICILLIN/SULBACTAM 8 SENSITIVE Sensitive     PIP/TAZO <=4 SENSITIVE Sensitive     * ESCHERICHIA COLI  Blood Culture ID Panel (Reflexed)     Status: Abnormal   Collection Time: 03/26/23  2:36 PM  Result Value Ref Range Status   Enterococcus faecalis NOT DETECTED NOT DETECTED Final   Enterococcus Faecium NOT DETECTED NOT DETECTED Final   Listeria monocytogenes NOT DETECTED NOT DETECTED Final   Staphylococcus species NOT DETECTED NOT DETECTED Final   Staphylococcus aureus (BCID) NOT DETECTED NOT DETECTED Final   Staphylococcus epidermidis NOT DETECTED NOT DETECTED Final   Staphylococcus lugdunensis NOT DETECTED NOT DETECTED Final   Streptococcus species NOT DETECTED NOT DETECTED Final   Streptococcus agalactiae NOT DETECTED NOT DETECTED Final   Streptococcus pneumoniae NOT DETECTED NOT DETECTED Final   Streptococcus pyogenes NOT DETECTED NOT DETECTED Final   A.calcoaceticus-baumannii NOT DETECTED NOT DETECTED Final   Bacteroides fragilis NOT DETECTED NOT DETECTED Final   Enterobacterales DETECTED (A) NOT DETECTED Final    Comment: Enterobacterales represent a large order of gram negative bacteria, not a single organism. CRITICAL RESULT CALLED TO, READ BACK BY AND VERIFIED WITH: PHARMD N. GLOGOVAC 03/27/23 @ 0625 BY AB    Enterobacter cloacae complex NOT DETECTED NOT DETECTED Final   Escherichia coli DETECTED (A) NOT DETECTED Final  Comment: CRITICAL RESULT CALLED TO, READ BACK BY AND VERIFIED WITH: PHARMD N. GLOGOVAC 03/27/23 @ 0625 BY AB    Klebsiella aerogenes NOT DETECTED NOT DETECTED Final   Klebsiella oxytoca NOT DETECTED NOT DETECTED Final   Klebsiella pneumoniae NOT DETECTED NOT DETECTED Final   Proteus species NOT DETECTED NOT DETECTED Final   Salmonella species NOT DETECTED NOT DETECTED Final   Serratia marcescens NOT DETECTED NOT DETECTED Final   Haemophilus influenzae NOT DETECTED NOT DETECTED Final   Neisseria  meningitidis NOT DETECTED NOT DETECTED Final   Pseudomonas aeruginosa NOT DETECTED NOT DETECTED Final   Stenotrophomonas maltophilia NOT DETECTED NOT DETECTED Final   Candida albicans NOT DETECTED NOT DETECTED Final   Candida auris NOT DETECTED NOT DETECTED Final   Candida glabrata NOT DETECTED NOT DETECTED Final   Candida krusei NOT DETECTED NOT DETECTED Final   Candida parapsilosis NOT DETECTED NOT DETECTED Final   Candida tropicalis NOT DETECTED NOT DETECTED Final   Cryptococcus neoformans/gattii NOT DETECTED NOT DETECTED Final   CTX-M ESBL NOT DETECTED NOT DETECTED Final   Carbapenem resistance IMP NOT DETECTED NOT DETECTED Final   Carbapenem resistance KPC NOT DETECTED NOT DETECTED Final   Carbapenem resistance NDM NOT DETECTED NOT DETECTED Final   Carbapenem resist OXA 48 LIKE NOT DETECTED NOT DETECTED Final   Carbapenem resistance VIM NOT DETECTED NOT DETECTED Final    Comment: Performed at [facility], [address], [place], [place] [address]  Culture, blood (Routine X 2) w Reflex to ID Panel     Status: None   Collection Time: 03/26/23  4:52 PM   Specimen: BLOOD  Result Value Ref Range Status   Specimen Description   Final    BLOOD BLOOD LEFT ARM Performed at [facility], [address] [place], [place] [address]    Special Requests   Final    AEROBIC BOTTLE ONLY Blood Culture adequate volume Performed at [facility], [address], [place], [place] [address]    Culture   Final    NO GROWTH 5 DAYS Performed at [facility], [address], [place], [place] [address]    Report Status 03/31/2023 FINAL  Final     Radiology Studies: No results found.  Scheduled Meds:  amiodarone  200 mg Oral Daily   Chlorhexidine Gluconate Cloth  6 each Topical Daily   feeding supplement  237 mL Oral BID BM   furosemide  80 mg Intravenous BID   levothyroxine  100 mcg Oral Q0600   metoprolol tartrate  25 mg Oral BID    mometasone-formoterol  2 puff Inhalation BID   pantoprazole (PROTONIX) IV  40 mg Intravenous Q12H   sodium chloride flush  3 mL Intravenous Q12H   Continuous Infusions:  cefTRIAXone (ROCEPHIN)  IV 2 g (04/01/23 1019)     LOS: 7 days   Spent: 35 minutes.  Barnetta Chapel, MD [facility] P4/27/2024, 5:31 PM

## 2023-04-02 ENCOUNTER — Inpatient Hospital Stay (HOSPITAL_COMMUNITY): Payer: No Typology Code available for payment source

## 2023-04-02 ENCOUNTER — Encounter (HOSPITAL_COMMUNITY): Payer: Self-pay | Admitting: Gastroenterology

## 2023-04-02 DIAGNOSIS — C186 Malignant neoplasm of descending colon: Secondary | ICD-10-CM

## 2023-04-02 DIAGNOSIS — I5043 Acute on chronic combined systolic (congestive) and diastolic (congestive) heart failure: Secondary | ICD-10-CM | POA: Diagnosis not present

## 2023-04-02 DIAGNOSIS — I5042 Chronic combined systolic (congestive) and diastolic (congestive) heart failure: Secondary | ICD-10-CM | POA: Diagnosis not present

## 2023-04-02 DIAGNOSIS — R0602 Shortness of breath: Secondary | ICD-10-CM

## 2023-04-02 DIAGNOSIS — Z79899 Other long term (current) drug therapy: Secondary | ICD-10-CM

## 2023-04-02 DIAGNOSIS — G479 Sleep disorder, unspecified: Secondary | ICD-10-CM

## 2023-04-02 DIAGNOSIS — K922 Gastrointestinal hemorrhage, unspecified: Secondary | ICD-10-CM | POA: Diagnosis not present

## 2023-04-02 DIAGNOSIS — Z515 Encounter for palliative care: Secondary | ICD-10-CM | POA: Diagnosis not present

## 2023-04-02 DIAGNOSIS — I5041 Acute combined systolic (congestive) and diastolic (congestive) heart failure: Secondary | ICD-10-CM

## 2023-04-02 DIAGNOSIS — N179 Acute kidney failure, unspecified: Secondary | ICD-10-CM | POA: Diagnosis not present

## 2023-04-02 LAB — RENAL FUNCTION PANEL
Albumin: 2.2 g/dL — ABNORMAL LOW (ref 3.5–5.0)
Anion gap: 14 (ref 5–15)
BUN: 19 mg/dL (ref 8–23)
CO2: 21 mmol/L — ABNORMAL LOW (ref 22–32)
Calcium: 8.2 mg/dL — ABNORMAL LOW (ref 8.9–10.3)
Chloride: 101 mmol/L (ref 98–111)
Creatinine, Ser: 1.01 mg/dL (ref 0.61–1.24)
GFR, Estimated: >60 mL/min (ref >60)
Glucose, Bld: 127 mg/dL — ABNORMAL HIGH (ref 70–99)
Phosphorus: 3.9 mg/dL (ref 2.5–4.6)
Potassium: 3.2 mmol/L — ABNORMAL LOW (ref 3.5–5.1)
Sodium: 136 mmol/L (ref 135–145)

## 2023-04-02 LAB — MAGNESIUM: Magnesium: 2.1 mg/dL (ref 1.7–2.4)

## 2023-04-02 MED ORDER — POTASSIUM CHLORIDE CRYS ER 20 MEQ PO TBCR
40.0000 meq | EXTENDED_RELEASE_TABLET | Freq: Two times a day (BID) | ORAL | Status: AC
  Administered 2023-04-02 (×2): 40 meq via ORAL
  Filled 2023-04-02 (×2): qty 2

## 2023-04-02 MED ORDER — METHYLPREDNISOLONE SODIUM SUCC 40 MG IJ SOLR
40.0000 mg | Freq: Two times a day (BID) | INTRAMUSCULAR | Status: AC
  Administered 2023-04-02 – 2023-04-04 (×6): 40 mg via INTRAVENOUS
  Filled 2023-04-02 (×6): qty 1

## 2023-04-02 MED ORDER — OXYCODONE HCL 5 MG PO TABS
7.5000 mg | ORAL_TABLET | ORAL | Status: DC | PRN
  Administered 2023-04-02: 7.5 mg via ORAL
  Filled 2023-04-02: qty 2

## 2023-04-02 MED ORDER — IPRATROPIUM-ALBUTEROL 0.5-2.5 (3) MG/3ML IN SOLN
3.0000 mL | Freq: Three times a day (TID) | RESPIRATORY_TRACT | Status: DC
  Administered 2023-04-02 – 2023-04-04 (×6): 3 mL via RESPIRATORY_TRACT
  Filled 2023-04-02 (×6): qty 3

## 2023-04-02 MED ORDER — OXYCODONE HCL 5 MG PO TABS: 7.5000 mg | ORAL_TABLET | ORAL | Status: DC | PRN

## 2023-04-02 MED ORDER — OXYCODONE HCL 5 MG PO TABS
10.0000 mg | ORAL_TABLET | ORAL | Status: DC | PRN
  Administered 2023-04-02 – 2023-04-08 (×8): 10 mg via ORAL
  Filled 2023-04-02 (×8): qty 2

## 2023-04-02 MED ORDER — TRAZODONE HCL 50 MG PO TABS
25.0000 mg | ORAL_TABLET | Freq: Every day | ORAL | Status: DC
  Administered 2023-04-02 – 2023-04-07 (×6): 25 mg via ORAL
  Filled 2023-04-02 (×6): qty 1

## 2023-04-02 MED ORDER — MORPHINE SULFATE (PF) 2 MG/ML IV SOLN
2.0000 mg | INTRAVENOUS | Status: DC | PRN
  Administered 2023-04-02 – 2023-04-08 (×16): 2 mg via INTRAVENOUS
  Filled 2023-04-02 (×16): qty 1

## 2023-04-02 MED ORDER — FUROSEMIDE 10 MG/ML IJ SOLN
60.0000 mg | Freq: Once | INTRAMUSCULAR | Status: AC
  Administered 2023-04-02: 60 mg via INTRAVENOUS
  Filled 2023-04-02: qty 6

## 2023-04-02 MED ORDER — FUROSEMIDE 10 MG/ML IJ SOLN: 60.0000 mg | Freq: Once | INTRAMUSCULAR | Status: DC

## 2023-04-02 NOTE — Plan of Care (Signed)

## 2023-04-02 NOTE — Progress Notes (Signed)
PROGRESS NOTE  Charles Mckinney  ZOX:[id number] DOB: 1951-11-21 DOA: 03/24/2023 PCP: Virl Cagey, MD   Brief Narrative: Patient is a 72 year old male from [facility] with history of hypertension, COPD, chronic hypoxic respiratory failure on 2 L of oxygen , CKD stage IIIb, atrial fibrillation on Eliquis, combined systolic/diastolic CHF, OSA, AAA who presents to the emergency department with complaint of dark blood in the stool multiple times.  He noticed dark red blood in diaper multiple times over the past week.  Takes Eliquis.  On presentation, lab work showed potassium of 2.2, hemoglobin 7.4, creatinine of 1.9 . Patient was admitted for the management of  GI bleed, GI consulted. Hospital course remarkable for persistent A-fib with RVR, hypokalemia, melanotic stools.  Currently on amiodarone.  Cardiology following.  Colonoscopy showed large rectosigmoid partially obstructing mass ,likely malignant.  Patient was seen by medical oncology, general surgery and then palliative medicine.   Assessment & Plan:  Acute lower GI bleed Secondary to colon cancer. EGD showed nonbleeding gastric ulcer with a clean ulcer base.  Colonoscopy showed large rectosigmoid partially obstructing mass. Seems to have resolved.  Rectosigmoid adenocarcinoma Colonoscopy showed large rectosigmoid partially obstructing mass .biopsy positive for adenocarcinoma.  In by general surgery and medical oncology.  Not thought to be a candidate for any kind of surgical or medical intervention including chemotherapy. Seen by palliative medicine.  Plan is for hospice care going forward.    Persistent A-fib with RVR On Eliquis at home and metoprolol for rate control.   Eliquis on hold. Blood pressure was soft on 4/22 and he was not rapid A-fib so he was sent down to stepdown and started on amiodarone drip.  Cardiology following.  BP remains high and heart rate remains more than 100 most of the time. Patient is now on oral  amiodarone.  Dyspnea Planes of more shortness of breath this morning.  Chest x-ray shows persistent left-sided pleural effusion versus consolidation.  Patient does have a history of systolic CHF as discussed below.  Patient was given IV furosemide.  Will repeat additional dose today.  Can also use morphine for air hunger. Essentially done CT scan of the chest also reviewed.    Gram-negative bacteremia likely from urinary source Had mild leukocytosis, became febrile on 4/21.   Urine culture/blood cultures showing E. coli.   Patient was placed on ceftriaxone.  Will complete 7 days of treatment today.  Now afebrile.  WBC was normal when last checked.  Can discontinue antibiotics after today's dose.     AKI in CKD stage IIIa Baseline creatinine was 1.5, 3 years ago . Presented with creatinine of 1.9.  Most likely prerenal in the setting of blood loss.  Renal function has improved.  Recheck labs tomorrow.  Combined systolic/diastolic CHF Echo done on 10/2018 showed EF of 30 to 35%, diffuse hypokinesis. Has chronic edema bilateral lower extremities.  Patient was seen by cardiology.  Now they have signed off. Patient was on Bumex 4 mg by mouth daily and then 2 mg as needed. Will continue with IV furosemide due to his persistent dyspnea.  Will need further goals of care conversation.   Hypokalemia Recheck labs tomorrow.     OSA On CPAP   COPD Not in exacerbation.  Uses 2L [place] at home.   Elevated alkaline phosphatase Elevated GGT. liver enzymes normal.  Right upper quadrant ultrasound showed hepatic steatosis  Hyponatremia: Stable  Left upper extremity edema: No DVT identified.  Superficial vein thrombosis was noted.  Debility/deconditioning/obesity: Morbidly obese patient.  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  Now has colorectal cancer, permanent A-fib, E. coli bacteremia.  Ambulates with the help of wheelchair.  Lives at a SNF, [facility] consulted.  Very poor quality of  life due to several comorbidities.    Goals of care Palliative medicine is following.  Plan is for discharge with hospice services.  However patient with persistent dyspnea.  May benefit from addressing CODE STATUS as well prior to discharge.  Discussed with palliative medicine provider today.   DVT prophylaxis:SCDs Start: 03/25/23 0408 Code Status: Full Code Family Communication: No family at bedside Disposition: SNF though patient refuses to go back to [facility].    Consultants: GI, cardiology, palliative care, general surgery,oncology  Procedures: EGD/colonoscopy  Antimicrobials:  Anti-infectives (From admission, onward)    Start     Dose/Rate Route Frequency Ordered Stop   03/27/23 1000  cefTRIAXone (ROCEPHIN) 2 g in sodium chloride 0.9 % 100 mL IVPB        2 g 200 mL/hr over 30 Minutes Intravenous Every 24 hours 03/27/23 0737     03/26/23 1600  cefTRIAXone (ROCEPHIN) 2 g in sodium chloride 0.9 % 100 mL IVPB  Status:  Discontinued        2 g 200 mL/hr over 30 Minutes Intravenous Every 24 hours 03/26/23 1510 03/27/23 0737   03/26/23 1600  azithromycin (ZITHROMAX) 500 mg in sodium chloride 0.9 % 250 mL IVPB  Status:  Discontinued        500 mg 250 mL/hr over 60 Minutes Intravenous Every 24 hours 03/26/23 1510 03/27/23 0736       Subjective: Continues to have shortness of breath.  Denies any chest pain per se.  Objective: Vitals:   04/01/23 1550 04/01/23 2033 04/02/23 0618 04/02/23 0818  BP: 109/76 115/70 131/82   Pulse: 79 91 91   Resp: 19 (!) 22 18   Temp: 98.3 F (36.8 C) 98.1 F (36.7 C) 98.2 F (36.8 C)   TempSrc: Oral Oral Oral   SpO2: 98% 97% 98% 98%  Weight:      Height:        Intake/Output Summary (Last 24 hours) at 04/02/2023 1016 Last data filed at 04/02/2023 0842 Gross per 24 hour  Intake 300 ml  Output 200 ml  Net 100 ml    Filed Weights   03/27/23 0449 03/30/23 0434 04/01/23 0500  Weight: 127.8 kg 132.9 kg 131.6 kg     Examination:  General appearance: Awake alert.  In no distress.  Seems to be in some discomfort. Resp: Tachypnea.  Diminished air entry at the bases specially on the left.  Few crackles.  No rhonchi.  No wheezing. Cardio: S1-S2 is normal regular.  No S3-S4.  No rubs murmurs or bruit GI: Abdomen is soft.  Nontender nondistended.  Bowel sounds are present normal.  No masses organomegaly Extremities: Physical deconditioning noted.  Data Reviewed: I have personally reviewed following labs and imaging studies  CBC: Recent Labs  Lab 03/27/23 0524 03/28/23 0326 03/29/23 0255 03/30/23 0811 03/31/23 0259  WBC  --  10.4 9.8 11.1* 10.0  HGB 8.9* 9.1* 8.4* 9.5* 8.8*  HCT 30.1* 32.2* 29.8* 33.3* 30.7*  MCV  --  89.9 90.3 89.0 88.0  PLT  --  467* 448* 454* 426*    Basic Metabolic Panel: Recent Labs  Lab 03/27/23 0523 03/27/23 0524 03/28/23 0326 03/29/23 0255 03/29/23 1553 03/30/23 0811 03/31/23 0259 04/02/23 0734  NA  --    < >  136 133*  --  133* 132* 136  K  --    < > 3.2* 2.9* 4.0 3.7 3.4* 3.2*  CL  --    < > 98 101  --  100 100 101  CO2  --    < > 25 22  --  22 21* 21*  GLUCOSE  --    < > 112* 123*  --  121* 113* 127*  BUN  --    < > 26* 20  --  17 17 19   CREATININE  --    < > 1.24 1.12  --  1.23 1.03 1.01  CALCIUM  --    < > 8.2* 7.9*  --  8.2* 8.1* 8.2*  MG 2.3  --   --   --   --   --   --  2.1  PHOS  --   --   --   --   --   --   --  3.9   < > = values in this interval not displayed.      Recent Results (from the past 240 hour(s))  MRSA Next Gen by PCR, Nasal     Status: None   Collection Time: 03/25/23  3:31 PM   Specimen: Nasal Mucosa; Nasal Swab  Result Value Ref Range Status   MRSA by PCR Next Gen NOT DETECTED NOT DETECTED Final    Comment: (NOTE) The GeneXpert MRSA Assay (FDA approved for NASAL specimens only), is one component of a comprehensive MRSA colonization surveillance program. It is not intended to diagnose MRSA infection nor to guide or  monitor treatment for MRSA infections. Test performance is not FDA approved in patients less than 97 years old. Performed at [facility], [address], [place], [place] [address]   Urine Culture (for pregnant, neutropenic or urologic patients or patients with an indwelling urinary catheter)     Status: Abnormal   Collection Time: 03/26/23 10:39 AM   Specimen: Urine, Clean Catch  Result Value Ref Range Status   Specimen Description   Final    URINE, CLEAN CATCH Performed at [facility], [address], [place], [place] [address]    Special Requests   Final    NONE Performed at [facility], [address] [place], [place] [address]    Culture >=100,000 COLONIES/mL ESCHERICHIA COLI (A)  Final   Report Status 03/28/2023 FINAL  Final   Organism ID, Bacteria ESCHERICHIA COLI (A)  Final      Susceptibility   Escherichia coli - MIC*    AMPICILLIN >=32 RESISTANT Resistant     CEFAZOLIN <=4 SENSITIVE Sensitive     CEFEPIME <=0.12 SENSITIVE Sensitive     CEFTRIAXONE <=0.25 SENSITIVE Sensitive     CIPROFLOXACIN <=0.25 SENSITIVE Sensitive     GENTAMICIN <=1 SENSITIVE Sensitive     IMIPENEM <=0.25 SENSITIVE Sensitive     NITROFURANTOIN <=16 SENSITIVE Sensitive     TRIMETH/SULFA >=320 RESISTANT Resistant     AMPICILLIN/SULBACTAM 8 SENSITIVE Sensitive     PIP/TAZO <=4 SENSITIVE Sensitive     * >=100,000 COLONIES/mL ESCHERICHIA COLI  Culture, blood (Routine X 2) w Reflex to ID Panel     Status: Abnormal   Collection Time: 03/26/23  2:36 PM   Specimen: BLOOD  Result Value Ref Range Status   Specimen Description   Final    BLOOD BLOOD LEFT ARM Performed at [facility], [address] [place], [place]  [address]    Special Requests   Final    AEROBIC BOTTLE ONLY Blood Culture adequate volume Performed at [facility], [address], [place], [place] [address]    Culture  Setup Time   Final     GRAM NEGATIVE RODS AEROBIC BOTTLE ONLY CRITICAL RESULT CALLED TO, READ BACK BY AND VERIFIED WITH: PHARMD N. GLOGOVAC 03/27/23 @ 0625 BY AB Performed at [facility], [address] [place], [place] [address]    Culture ESCHERICHIA COLI (A)  Final   Report Status 03/29/2023 FINAL  Final   Organism ID, Bacteria ESCHERICHIA COLI  Final      Susceptibility   Escherichia coli - MIC*    AMPICILLIN >=32 RESISTANT Resistant     CEFEPIME <=0.12 SENSITIVE Sensitive     CEFTAZIDIME <=1 SENSITIVE Sensitive     CEFTRIAXONE <=0.25 SENSITIVE Sensitive     CIPROFLOXACIN <=0.25 SENSITIVE Sensitive     GENTAMICIN <=1 SENSITIVE Sensitive     IMIPENEM <=0.25 SENSITIVE Sensitive     TRIMETH/SULFA >=320 RESISTANT Resistant     AMPICILLIN/SULBACTAM 8 SENSITIVE Sensitive     PIP/TAZO <=4 SENSITIVE Sensitive     * ESCHERICHIA COLI  Blood Culture ID Panel (Reflexed)     Status: Abnormal   Collection Time: 03/26/23  2:36 PM  Result Value Ref Range Status   Enterococcus faecalis NOT DETECTED NOT DETECTED Final   Enterococcus Faecium NOT DETECTED NOT DETECTED Final   Listeria monocytogenes NOT DETECTED NOT DETECTED Final   Staphylococcus species NOT DETECTED NOT DETECTED Final   Staphylococcus aureus (BCID) NOT DETECTED NOT DETECTED Final   Staphylococcus epidermidis NOT DETECTED NOT DETECTED Final   Staphylococcus lugdunensis NOT DETECTED NOT DETECTED Final   Streptococcus species NOT DETECTED NOT DETECTED Final   Streptococcus agalactiae NOT DETECTED NOT DETECTED Final   Streptococcus pneumoniae NOT DETECTED NOT DETECTED Final   Streptococcus pyogenes NOT DETECTED NOT DETECTED Final   A.calcoaceticus-baumannii NOT DETECTED NOT DETECTED Final   Bacteroides fragilis NOT DETECTED NOT DETECTED Final   Enterobacterales DETECTED (A) NOT DETECTED Final    Comment: Enterobacterales represent a large order of gram negative bacteria, not a single organism. CRITICAL RESULT CALLED TO, READ BACK BY AND  VERIFIED WITH: PHARMD N. GLOGOVAC 03/27/23 @ 0625 BY AB    Enterobacter cloacae complex NOT DETECTED NOT DETECTED Final   Escherichia coli DETECTED (A) NOT DETECTED Final    Comment: CRITICAL RESULT CALLED TO, READ BACK BY AND VERIFIED WITH: PHARMD N. GLOGOVAC 03/27/23 @ 0625 BY AB    Klebsiella aerogenes NOT DETECTED NOT DETECTED Final   Klebsiella oxytoca NOT DETECTED NOT DETECTED Final   Klebsiella pneumoniae NOT DETECTED NOT DETECTED Final   Proteus species NOT DETECTED NOT DETECTED Final   Salmonella species NOT DETECTED NOT DETECTED Final   Serratia marcescens NOT DETECTED NOT DETECTED Final   Haemophilus influenzae NOT DETECTED NOT DETECTED Final   Neisseria meningitidis NOT DETECTED NOT DETECTED Final   Pseudomonas aeruginosa NOT DETECTED NOT DETECTED Final   Stenotrophomonas maltophilia NOT DETECTED NOT DETECTED Final   Candida albicans NOT DETECTED NOT DETECTED Final   Candida auris NOT DETECTED NOT DETECTED Final   Candida glabrata NOT DETECTED NOT DETECTED Final   Candida krusei NOT DETECTED NOT DETECTED Final   Candida parapsilosis NOT DETECTED NOT DETECTED Final   Candida tropicalis NOT DETECTED NOT DETECTED Final   Cryptococcus neoformans/gattii NOT DETECTED NOT DETECTED Final   CTX-M ESBL NOT DETECTED NOT DETECTED Final  Carbapenem resistance IMP NOT DETECTED NOT DETECTED Final   Carbapenem resistance KPC NOT DETECTED NOT DETECTED Final   Carbapenem resistance NDM NOT DETECTED NOT DETECTED Final   Carbapenem resist OXA 48 LIKE NOT DETECTED NOT DETECTED Final   Carbapenem resistance VIM NOT DETECTED NOT DETECTED Final    Comment: Performed at [facility], [address], [place], [place] [address]  Culture, blood (Routine X 2) w Reflex to ID Panel     Status: None   Collection Time: 03/26/23  4:52 PM   Specimen: BLOOD  Result Value Ref Range Status   Specimen Description   Final    BLOOD BLOOD LEFT ARM Performed at [facility], [address], [place], [place] [address]    Special Requests   Final    AEROBIC BOTTLE ONLY Blood Culture adequate volume Performed at [facility], [address], [place], [place] [address]    Culture   Final    NO GROWTH 5 DAYS Performed at [facility], [address], [place], [place] [address]    Report Status 03/31/2023 FINAL  Final     Radiology Studies: DG CHEST PORT 1 VIEW  Result Date: 04/02/2023 CLINICAL DATA:  Dyspnea. EXAM: PORTABLE CHEST 1 VIEW COMPARISON:  03/26/2023 FINDINGS: The cardio pericardial silhouette is enlarged. There is pulmonary vascular congestion without overt pulmonary edema. Left base collapse/consolidation with effusion is similar to prior. Telemetry leads overlie the chest. IMPRESSION: 1. No substantial interval change. 2. Left base collapse/consolidation with effusion. Electronically Signed   By: Kennith Center M.D.   On: 04/02/2023 09:03    Scheduled Meds:  amiodarone  200 mg Oral Daily   Chlorhexidine Gluconate Cloth  6 each Topical Daily   feeding supplement  237 mL Oral BID BM   ipratropium-albuterol  3 mL Nebulization TID   levothyroxine  100 mcg Oral Q0600   metoprolol tartrate  25 mg Oral BID   mometasone-formoterol  2 puff Inhalation BID   pantoprazole (PROTONIX) IV  40 mg Intravenous Q12H   sodium chloride flush  3 mL Intravenous Q12H   Continuous Infusions:  cefTRIAXone (ROCEPHIN)  IV 2 g (04/02/23 1004)     LOS: 8 days    Osvaldo Shipper, MD [facility] P4/28/2024, 10:16 AM

## 2023-04-02 NOTE — Progress Notes (Addendum)
Daily Progress Note   Patient Name: Charles Mckinney       Date: 04/02/2023 DOB: 1951/11/15  Age: 72 y.o. MRN#: [id number] Attending Physician: Osvaldo Shipper, MD Primary Care Physician: Virl Cagey, MD Admit Date: 03/24/2023 Length of Stay: 8 days  Reason for Consultation/Follow-up: Establishing goals of care  Subjective:   CC: Patient noting shortness of breath today.  Following up regarding complex medical decision making and symptom management.  Subjective: Reviewed EMR prior to seeing patient.  Also discussed care with patient's hospitalist.  It appears more volume overloaded at this time with worsening shortness of breath.  Patient will appropriately receive IV Lasix and imaging to assist with management.  Patient will likely need to be on ongoing medications for fluid management of combined systolic/diastolic CHF to allow for patient's comfort. At time of EMR review, patient had received oxycodone 5 mg every 6 hours as needed x 2 doses.  Presented to bedside to meet with patient.  No family present at bedside during conversation.  Reintroduced myself as a member of the palliative medicine team.  Patient noted to be laying comfortably in bed.  Patient notes his only symptom of concern at this time is his worsening shortness of breath.  Noted patient was receiving IV Lasix which will hopefully assist with volume management.  Also explored patient's use of oxycodone for pain and dyspnea.  Patient noted that he is only been paying attention if the oxycodone helps him with his pain which he feels it does slightly though could be improved.  We discussed increasing oxycodone dose slightly to assist with not only pain, but also dyspnea management.  Noted would ask RN to provide a dose and patient should monitor if this helps with the symptoms of dyspnea.  Patient agreeing with this plan.  Again reviewed medical plan moving forward.  Patient noting he is returning to his long-term care facility  as he "has nowhere else to go".  Again we discussed addition of hospice support at his facility and this continues to bring patient some level of comfort knowing there will be extra team members to make sure his symptoms are well-managed.  Also spent time discussing patient's CODE STATUS with him.  Explained differences between full code and DNR.  Discussed medical recommendation that should patient be sick enough that his heart were to stop or he were to stop breathing, interventions such as cardiac resuscitation and mechanical ventilation would not necessarily improve his quality of life and would not improve the cancer he currently has.  Patient acknowledged hearing this and noted he wanted this discussed with his son.  Acknowledged this and that would discussed with his son CODE STATUS of DNR.  All questions answered at that time.  Thanked patient for allowing me to visit with him today.  After visit with patient, attempted to call patient's son multiple times to discuss CODE STATUS and updates regarding medical management at this time.  Unfortunately there was no answer despite these attempts.  Noted palliative medicine team would continue to follow along and reach out to son as able.  Objective:   Vital Signs:  BP 131/82 (BP Location: Right Arm)   Pulse 91   Temp 98.2 F (36.8 C) (Oral)   Resp 18   Ht 5\' 10"  (1.778 m)   Wt 131.6 kg   SpO2 98%   BMI 41.63 kg/m   Physical Exam: General: NAD, laying in bed, chronically ill-appearing Eyes: No drainage noted Cardiovascular: tachycardia noted Respiratory:  no increased work of breathing noted, not in respiratory distress, on [place] O2 Abdomen: obese Skin: no rashes or lesions on visible skin Neuro: Awake  Imaging:  I personally reviewed recent imaging.   Assessment & Plan:   Assessment: Patient is a 72 year old male with a past medical history of hypertension, COPD, chronic hypoxic respiratory failure on 2 L of oxygen, CKD stage  IIIb, atrial fibrillation on Eliquis, combined systolic/diastolic CHF, OSA, AAA, and severe arthritis leading to bedbound status with pressure ulcers who was admitted on 03/24/2019 for for management of multiple dark stools. Since admission, GI has been consulted for workup of acute lower GI bleed. Patient also noted to have severe sepsis secondary to gram-negative bacteremia for which she is currently receiving antibiotics and is on pressor support. Palliative medicine team consulted to assist with complex medical decision making.   Recommendations/Plan: # Complex medical decision making/goals of care:  - During extensive discussion with patient and family on 4/25, determined patient is a high surgical risk and not appropriate for further cancer directed therapies such as chemotherapy.  Goal is for patient to return to his long-term care facility with hospice support.  TOC assisting with coordination and plan is for patient to return with [facility]  hosp[facility].   -Continue to provide support and reminders that hospice will also assist with his symptom management moving forward at the long-term care facility which patient is thankful for.  -  Code Status: Full Code   -Attempted to address CODE STATUS today with patient.  Patient voiced hearing differences between full code and DNR (medically recommend DNR as cardiac resuscitation will not improve patient's QOL or change his underlying cancer diagnosis and progression) and patient still wants this further discussed with his son.  Patient has been deferring most medical decisions to his son.  Despite multiple attempts, unable to reach son to discuss this further.  Will attempt to contact son as able.  # Symptom management: Non-pharmacologic Delirium Precautions  - Frequent re-orientation; update board w/ date, names of treatment team  - Daytime stimulation: Open curtains, turn lights on, and turn on television as tolerated during waking hours  - Nighttime  calm - close curtains, turn lights off, and turn off television at 9pm  with minimal interruptions from treatment team during sleeping hours, including avoiding lab draws if possible  - Encourage continued presence of family/friends  - Occupy w/ distractions - e.g. ask them to fold washcloths, busy-board  - Encourage movement with PT/OT as tolerated  - Ensure adequate sensorium, to include providing glasses, dentures, and hearing aids to patient as appropriate  - Assess and treat pain (incl nonverbal cues); e.g. consider scheduled tylenol  - Assess and treat constipation and urinary retention  - Ensure hydration, electrolyte balance (K to 4.0, Mg to 2.0), adequate oxygenation  - Review medications (addition, deletion, changes can trigger)   # Discharge Planning: Return to long term care facility with [facility] hospice  Thank you for allowing the palliative care team to participate in the care Charles Mckinney.  Charles Morin, DO Palliative Care Provider PMT # [phone]  UPDATE: Presented ot bedside later in day once RN informed provider that family was at bedside. Presented to bedside and met with patient, his two sons, and his ex-wife.  At this time patient feels that the oxycodone 7.5 mg did not improve his work of breathing.  Noted would increase oxycodone dose to 10 mg every 4 hours as needed.  Patient denies adverse effects from  7.5 mg dose of oxycodone.  Again reviewed that fluid management likely will be most beneficial to patient's underlying dyspnea.  Appreciate hospitalist assistance with Lasix management.  Noted RN could provide IV opioid at this time for faster symptomatic relief.  Also requesting medication to assist with his insomnia at night.  Noted would start on low-dose trazodone 25 mg nightly.  Can further adjust if needed for appropriate symptom management.Also discussed importance of sleep hygiene as patient taking multiple naps during day which can disrupt sleep-wake cycle.    With patient's permission, able to further discuss patient's CODE STATUS.  Again described differences between full code and DNR.  Discussed medical recommendation that should patient be sick enough that his heart were to stop or he were to stop breathing, interventions such as cardiac resuscitation and mechanical ventilation would not improve patient's overall quality of life and would not change the outcome with patient's life limiting disease of colon cancer.  Patient deferring to his son Charles Mckinney to make decisions.  Charles Mckinney noting that he wants to support his father's decisions and so if his father agrees with DNR, he supports this and if his father wants full code, he supports this as well.  Did not come to resolution about CODE STATUS at this time.  Noted encouraged discussions between themselves about patient's CODE STATUS tobacco-alcohol recommendation for DNR.  All questions answered at that time.  Thank patient and his family for allowing me to visit with him today.  This provider spent a total of 51 minutes providing patient's care.  Includes review of EMR, discussing care with other staff members involved in patient's medical care, obtaining relevant history and information from patient and/or patient's family, and personal review of imaging and lab work. Greater than 50% of the time was spent counseling and coordinating care related to the above assessment and plan.    *Please note that this is a verbal dictation therefore any spelling or grammatical errors are due to the "[employer]" system interpretation.

## 2023-04-02 NOTE — Progress Notes (Signed)
Pt states he never worn cpap.

## 2023-04-02 NOTE — Progress Notes (Signed)
[facility] Liaison Note   [facility] will follow at [facility] for hospice. Please call when patient is ready for discharge.    Call with any questions or concerns. Thank you   Shanita Wicker, LCSW [facility] Hospital Liaison [phone] 

## 2023-04-03 DIAGNOSIS — C186 Malignant neoplasm of descending colon: Secondary | ICD-10-CM | POA: Diagnosis not present

## 2023-04-03 DIAGNOSIS — Z7189 Other specified counseling: Secondary | ICD-10-CM | POA: Diagnosis not present

## 2023-04-03 DIAGNOSIS — K922 Gastrointestinal hemorrhage, unspecified: Secondary | ICD-10-CM | POA: Diagnosis not present

## 2023-04-03 DIAGNOSIS — I5043 Acute on chronic combined systolic (congestive) and diastolic (congestive) heart failure: Secondary | ICD-10-CM | POA: Diagnosis not present

## 2023-04-03 DIAGNOSIS — I5041 Acute combined systolic (congestive) and diastolic (congestive) heart failure: Secondary | ICD-10-CM | POA: Diagnosis not present

## 2023-04-03 DIAGNOSIS — Z515 Encounter for palliative care: Secondary | ICD-10-CM | POA: Diagnosis not present

## 2023-04-03 LAB — CBC
HCT: 32.7 % — ABNORMAL LOW (ref 39.0–52.0)
Hemoglobin: 9.3 g/dL — ABNORMAL LOW (ref 13.0–17.0)
MCH: 24.6 pg — ABNORMAL LOW (ref 26.0–34.0)
MCHC: 28.4 g/dL — ABNORMAL LOW (ref 30.0–36.0)
MCV: 86.5 fL (ref 80.0–100.0)
Platelets: 486 10*3/uL — ABNORMAL HIGH (ref 150–400)
RBC: 3.78 MIL/uL — ABNORMAL LOW (ref 4.22–5.81)
RDW: 16.9 % — ABNORMAL HIGH (ref 11.5–15.5)
WBC: 11.7 10*3/uL — ABNORMAL HIGH (ref 4.0–10.5)
nRBC: 0 % (ref 0.0–0.2)

## 2023-04-03 LAB — BASIC METABOLIC PANEL
Anion gap: 10 (ref 5–15)
BUN: 23 mg/dL (ref 8–23)
CO2: 24 mmol/L (ref 22–32)
Calcium: 8.6 mg/dL — ABNORMAL LOW (ref 8.9–10.3)
Chloride: 103 mmol/L (ref 98–111)
Creatinine, Ser: 1.19 mg/dL (ref 0.61–1.24)
GFR, Estimated: >60 mL/min (ref >60)
Glucose, Bld: 152 mg/dL — ABNORMAL HIGH (ref 70–99)
Potassium: 4.1 mmol/L (ref 3.5–5.1)
Sodium: 137 mmol/L (ref 135–145)

## 2023-04-03 MED ORDER — SODIUM CHLORIDE 0.9 % IV SOLN
2.0000 g | INTRAVENOUS | Status: AC
  Administered 2023-04-03 – 2023-04-05 (×3): 2 g via INTRAVENOUS
  Filled 2023-04-03 (×3): qty 20

## 2023-04-03 MED ORDER — FUROSEMIDE 10 MG/ML IJ SOLN
80.0000 mg | Freq: Two times a day (BID) | INTRAMUSCULAR | Status: AC
  Administered 2023-04-03 (×2): 80 mg via INTRAVENOUS
  Filled 2023-04-03 (×2): qty 8

## 2023-04-03 NOTE — Progress Notes (Signed)
PROGRESS NOTE  Charles Mckinney  WUJ:[id number] DOB: [birth year] DOA: 03/24/2023 PCP: Virl Cagey, MD   Brief Narrative: Patient is a 72 year old male from [facility] with history of hypertension, COPD, chronic hypoxic respiratory failure on 2 L of oxygen , CKD stage IIIb, atrial fibrillation on Eliquis, combined systolic/diastolic CHF, OSA, AAA who presents to the emergency department with complaint of dark blood in the stool multiple times.  He noticed dark red blood in diaper multiple times over the past week.  Takes Eliquis.  On presentation, lab work showed potassium of 2.2, hemoglobin 7.4, creatinine of 1.9 . Patient was admitted for the management of  GI bleed, GI consulted. Hospital course remarkable for persistent A-fib with RVR, hypokalemia, melanotic stools.  Currently on amiodarone.  Cardiology following.  Colonoscopy showed large rectosigmoid partially obstructing mass ,likely malignant.  Patient was seen by medical oncology, general surgery and then palliative medicine.   Assessment & Plan:  Acute lower GI bleed Secondary to colon cancer. EGD showed nonbleeding gastric ulcer with a clean ulcer base.  Colonoscopy showed large rectosigmoid partially obstructing mass. Seems to have resolved.  Remains at high risk of rebleeding.  Rectosigmoid adenocarcinoma Colonoscopy showed large rectosigmoid partially obstructing mass .biopsy positive for adenocarcinoma.  Seen by general surgery and medical oncology.  Not thought to be a candidate for any kind of surgical or medical intervention including chemotherapy. Seen by palliative medicine.  Plan is for hospice care going forward.    Persistent A-fib with RVR Was on Eliquis at home and metoprolol for rate control.   Eliquis on hold. Blood pressure was soft on 4/22 and he was not rapid A-fib so he was sent down to stepdown and started on amiodarone drip.  Patient is now on oral amiodarone. He has signed off.  Dyspnea Have shortness of  breath.  Likely multifactorial including CHF as well as COPD.  Continue with IV furosemide for now.  Started on Solu-Medrol as well.  Continue with nebulizer treatments.  Continue with Dulera.  Saturations are in the 90s on 2 L of oxygen by nasal cannula. Morphine for air hunger.   Recently done CT scan reviewed and raised concern for left-sided consolidation/pleural effusion.    Gram-negative bacteremia likely from urinary source Had mild leukocytosis, became febrile on 4/21.   Urine culture/blood cultures showing E. coli.   Patient was placed on ceftriaxone.  Due to persistent findings of possible consolidation in the chest x-ray on the left side we will continue with ceftriaxone and give him a 10-day treatment.      AKI in CKD stage IIIa Baseline creatinine was 1.5, 3 years ago . Presented with creatinine of 1.9.  Most likely prerenal in the setting of blood loss.  Renal function back to baseline.  Combined systolic/diastolic CHF Echo done on 10/2018 showed EF of 30 to 35%, diffuse hypokinesis. Has chronic edema bilateral lower extremities.  Patient was seen by cardiology.  Now they have signed off. Patient was on Bumex 4 mg by mouth daily and then 2 mg as needed. Continue with IV furosemide for now.  Seems to have diuresed well yesterday.  Normocytic anemia/acute blood loss anemia Low hemoglobin multifactorial including GI loss from colonic mass.  Stable for the last several days.   Hypokalemia Labs are pending today.   OSA On CPAP   COPD See above.  Continue with inhaled steroids.   Elevated alkaline phosphatase Elevated GGT. liver enzymes normal.  Right upper quadrant ultrasound showed hepatic steatosis  Hyponatremia Stable  Left upper extremity edema No DVT identified.  Superficial vein thrombosis was noted.   Debility/deconditioning/obesity Morbidly obese patient.  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  Now has colorectal cancer, permanent A-fib, E.  coli bacteremia.  Ambulates with the help of wheelchair.  Lives at a SNF, [facility] consulted.  Very poor quality of life due to several comorbidities.   Estimated body mass index is 40.39 kg/m as calculated from the following:   Height as of this encounter: 5\' 10"  (1.778 m).   Weight as of this encounter: 127.7 kg.   Goals of care Palliative medicine is following.  Plan is for discharge with hospice services.   Remains full code.     DVT prophylaxis:SCDs Start: 03/25/23 0408 Code Status: Full Code Family Communication: No family at bedside Disposition: SNF though patient refuses to go back to [facility].  TOC is following.    Consultants: GI, cardiology, palliative care, general surgery,oncology  Procedures: EGD/colonoscopy  Antimicrobials:  Anti-infectives (From admission, onward)    Start     Dose/Rate Route Frequency Ordered Stop   03/27/23 1000  cefTRIAXone (ROCEPHIN) 2 g in sodium chloride 0.9 % 100 mL IVPB        2 g 200 mL/hr over 30 Minutes Intravenous Every 24 hours 03/27/23 0737 04/02/23 1034   03/26/23 1600  cefTRIAXone (ROCEPHIN) 2 g in sodium chloride 0.9 % 100 mL IVPB  Status:  Discontinued        2 g 200 mL/hr over 30 Minutes Intravenous Every 24 hours 03/26/23 1510 03/27/23 0737   03/26/23 1600  azithromycin (ZITHROMAX) 500 mg in sodium chloride 0.9 % 250 mL IVPB  Status:  Discontinued        500 mg 250 mL/hr over 60 Minutes Intravenous Every 24 hours 03/26/23 1510 03/27/23 0736       Subjective: Continues to feel short of breath though slightly better compared to yesterday.  No nausea vomiting.  No chest pain.  Denies any abdominal pain.  Objective: Vitals:   04/02/23 2041 04/02/23 2101 04/03/23 0447 04/03/23 0808  BP:  130/62 116/71   Pulse:   87   Resp:  20 17   Temp:  98.9 F (37.2 C) 98.5 F (36.9 C)   TempSrc:  Oral Oral   SpO2: 98% 96% 97% 91%  Weight:   127.7 kg   Height:        Intake/Output Summary (Last 24 hours) at 04/03/2023  0906 Last data filed at 04/03/2023 0844 Gross per 24 hour  Intake 840 ml  Output 875 ml  Net -35 ml    Filed Weights   03/30/23 0434 04/01/23 0500 04/03/23 0447  Weight: 132.9 kg 131.6 kg 127.7 kg    Examination:  General appearance: Awake alert.  In no distress Resp: Not as tachypneic today compared to yesterday.  Few crackles at the bases.  Diminished air entry.  No wheezing.   Cardio: S1-S2 is normal regular.  No S3-S4.  No rubs murmurs or bruit GI: Abdomen is soft.  Nontender nondistended.  Bowel sounds are present normal.  No masses organomegaly Extremities: Physical deconditioning noted No obvious focal neurological deficits.   Data Reviewed: I have personally reviewed following labs and imaging studies  CBC: Recent Labs  Lab 03/28/23 0326 03/29/23 0255 03/30/23 0811 03/31/23 0259  WBC 10.4 9.8 11.1* 10.0  HGB 9.1* 8.4* 9.5* 8.8*  HCT 32.2* 29.8* 33.3* 30.7*  MCV 89.9 90.3 89.0 88.0  PLT 467* 448*  454* 426*    Basic Metabolic Panel: Recent Labs  Lab 03/28/23 0326 03/29/23 0255 03/29/23 1553 03/30/23 0811 03/31/23 0259 04/02/23 0734  NA 136 133*  --  133* 132* 136  K 3.2* 2.9* 4.0 3.7 3.4* 3.2*  CL 98 101  --  100 100 101  CO2 25 22  --  22 21* 21*  GLUCOSE 112* 123*  --  121* 113* 127*  BUN 26* 20  --  17 17 19   CREATININE 1.24 1.12  --  1.23 1.03 1.01  CALCIUM 8.2* 7.9*  --  8.2* 8.1* 8.2*  MG  --   --   --   --   --  2.1  PHOS  --   --   --   --   --  3.9      Recent Results (from the past 240 hour(s))  MRSA Next Gen by PCR, Nasal     Status: None   Collection Time: 03/25/23  3:31 PM   Specimen: Nasal Mucosa; Nasal Swab  Result Value Ref Range Status   MRSA by PCR Next Gen NOT DETECTED NOT DETECTED Final    Comment: (NOTE) The GeneXpert MRSA Assay (FDA approved for NASAL specimens only), is one component of a comprehensive MRSA colonization surveillance program. It is not intended to diagnose MRSA infection nor to guide or monitor  treatment for MRSA infections. Test performance is not FDA approved in patients less than 49 years old. Performed at [facility], [address], [place], [place] [address]   Urine Culture (for pregnant, neutropenic or urologic patients or patients with an indwelling urinary catheter)     Status: Abnormal   Collection Time: 03/26/23 10:39 AM   Specimen: Urine, Clean Catch  Result Value Ref Range Status   Specimen Description   Final    URINE, CLEAN CATCH Performed at [facility], [address] [place], [place] [address]    Special Requests   Final    NONE Performed at [facility], [address], [place], [place] [address]    Culture >=100,000 COLONIES/mL ESCHERICHIA COLI (A)  Final   Report Status 03/28/2023 FINAL  Final   Organism ID, Bacteria ESCHERICHIA COLI (A)  Final      Susceptibility   Escherichia coli - MIC*    AMPICILLIN >=32 RESISTANT Resistant     CEFAZOLIN <=4 SENSITIVE Sensitive     CEFEPIME <=0.12 SENSITIVE Sensitive     CEFTRIAXONE <=0.25 SENSITIVE Sensitive     CIPROFLOXACIN <=0.25 SENSITIVE Sensitive     GENTAMICIN <=1 SENSITIVE Sensitive     IMIPENEM <=0.25 SENSITIVE Sensitive     NITROFURANTOIN <=16 SENSITIVE Sensitive     TRIMETH/SULFA >=320 RESISTANT Resistant     AMPICILLIN/SULBACTAM 8 SENSITIVE Sensitive     PIP/TAZO <=4 SENSITIVE Sensitive     * >=100,000 COLONIES/mL ESCHERICHIA COLI  Culture, blood (Routine X 2) w Reflex to ID Panel     Status: Abnormal   Collection Time: 03/26/23  2:36 PM   Specimen: BLOOD  Result Value Ref Range Status   Specimen Description   Final    BLOOD BLOOD LEFT ARM Performed at [facility], [address], [place], [place] [address]    Special Requests   Final    AEROBIC BOTTLE ONLY Blood Culture adequate volume Performed at [facility], [address] [place], [place] [address]    Culture  Setup Time   Final  GRAM NEGATIVE RODS AEROBIC BOTTLE ONLY CRITICAL RESULT CALLED TO, READ BACK BY AND VERIFIED WITH: PHARMD N. GLOGOVAC 03/27/23 @ 0625 BY AB Performed at [facility], [address], [place], [place] [address]    Culture ESCHERICHIA COLI (A)  Final   Report Status 03/29/2023 FINAL  Final   Organism ID, Bacteria ESCHERICHIA COLI  Final      Susceptibility   Escherichia coli - MIC*    AMPICILLIN >=32 RESISTANT Resistant     CEFEPIME <=0.12 SENSITIVE Sensitive     CEFTAZIDIME <=1 SENSITIVE Sensitive     CEFTRIAXONE <=0.25 SENSITIVE Sensitive     CIPROFLOXACIN <=0.25 SENSITIVE Sensitive     GENTAMICIN <=1 SENSITIVE Sensitive     IMIPENEM <=0.25 SENSITIVE Sensitive     TRIMETH/SULFA >=320 RESISTANT Resistant     AMPICILLIN/SULBACTAM 8 SENSITIVE Sensitive     PIP/TAZO <=4 SENSITIVE Sensitive     * ESCHERICHIA COLI  Blood Culture ID Panel (Reflexed)     Status: Abnormal   Collection Time: 03/26/23  2:36 PM  Result Value Ref Range Status   Enterococcus faecalis NOT DETECTED NOT DETECTED Final   Enterococcus Faecium NOT DETECTED NOT DETECTED Final   Listeria monocytogenes NOT DETECTED NOT DETECTED Final   Staphylococcus species NOT DETECTED NOT DETECTED Final   Staphylococcus aureus (BCID) NOT DETECTED NOT DETECTED Final   Staphylococcus epidermidis NOT DETECTED NOT DETECTED Final   Staphylococcus lugdunensis NOT DETECTED NOT DETECTED Final   Streptococcus species NOT DETECTED NOT DETECTED Final   Streptococcus agalactiae NOT DETECTED NOT DETECTED Final   Streptococcus pneumoniae NOT DETECTED NOT DETECTED Final   Streptococcus pyogenes NOT DETECTED NOT DETECTED Final   A.calcoaceticus-baumannii NOT DETECTED NOT DETECTED Final   Bacteroides fragilis NOT DETECTED NOT DETECTED Final   Enterobacterales DETECTED (A) NOT DETECTED Final    Comment: Enterobacterales represent a large order of gram negative bacteria, not a single organism. CRITICAL RESULT CALLED TO, READ BACK BY AND  VERIFIED WITH: PHARMD N. GLOGOVAC 03/27/23 @ 0625 BY AB    Enterobacter cloacae complex NOT DETECTED NOT DETECTED Final   Escherichia coli DETECTED (A) NOT DETECTED Final    Comment: CRITICAL RESULT CALLED TO, READ BACK BY AND VERIFIED WITH: PHARMD N. GLOGOVAC 03/27/23 @ 0625 BY AB    Klebsiella aerogenes NOT DETECTED NOT DETECTED Final   Klebsiella oxytoca NOT DETECTED NOT DETECTED Final   Klebsiella pneumoniae NOT DETECTED NOT DETECTED Final   Proteus species NOT DETECTED NOT DETECTED Final   Salmonella species NOT DETECTED NOT DETECTED Final   Serratia marcescens NOT DETECTED NOT DETECTED Final   Haemophilus influenzae NOT DETECTED NOT DETECTED Final   Neisseria meningitidis NOT DETECTED NOT DETECTED Final   Pseudomonas aeruginosa NOT DETECTED NOT DETECTED Final   Stenotrophomonas maltophilia NOT DETECTED NOT DETECTED Final   Candida albicans NOT DETECTED NOT DETECTED Final   Candida auris NOT DETECTED NOT DETECTED Final   Candida glabrata NOT DETECTED NOT DETECTED Final   Candida krusei NOT DETECTED NOT DETECTED Final   Candida parapsilosis NOT DETECTED NOT DETECTED Final   Candida tropicalis NOT DETECTED NOT DETECTED Final   Cryptococcus neoformans/gattii NOT DETECTED NOT DETECTED Final   CTX-M ESBL NOT DETECTED NOT DETECTED Final   Carbapenem resistance IMP NOT DETECTED NOT DETECTED Final   Carbapenem resistance KPC NOT DETECTED NOT DETECTED Final   Carbapenem resistance NDM NOT DETECTED NOT DETECTED Final   Carbapenem resist OXA 48 LIKE NOT DETECTED NOT DETECTED Final   Carbapenem resistance VIM  NOT DETECTED NOT DETECTED Final    Comment: Performed at [facility], [address] [place], [place] [address]  Culture, blood (Routine X 2) w Reflex to ID Panel     Status: None   Collection Time: 03/26/23  4:52 PM   Specimen: BLOOD  Result Value Ref Range Status   Specimen Description   Final    BLOOD BLOOD LEFT ARM Performed at [facility], [address] [place], [place] [address]    Special Requests   Final    AEROBIC BOTTLE ONLY Blood Culture adequate volume Performed at [facility], [address] [place], [place] [address]    Culture   Final    NO GROWTH 5 DAYS Performed at [facility], [address] [place], [place] [address]    Report Status 03/31/2023 FINAL  Final     Radiology Studies: DG CHEST PORT 1 VIEW  Result Date: 04/02/2023 CLINICAL DATA:  Dyspnea. EXAM: PORTABLE CHEST 1 VIEW COMPARISON:  03/26/2023 FINDINGS: The cardio pericardial silhouette is enlarged. There is pulmonary vascular congestion without overt pulmonary edema. Left base collapse/consolidation with effusion is similar to prior. Telemetry leads overlie the chest. IMPRESSION: 1. No substantial interval change. 2. Left base collapse/consolidation with effusion. Electronically Signed   By: Kennith Center M.D.   On: 04/02/2023 09:03    Scheduled Meds:  amiodarone  200 mg Oral Daily   Chlorhexidine Gluconate Cloth  6 each Topical Daily   feeding supplement  237 mL Oral BID BM   ipratropium-albuterol  3 mL Nebulization TID   levothyroxine  100 mcg Oral Q0600   methylPREDNISolone (SOLU-MEDROL) injection  40 mg Intravenous Q12H   metoprolol tartrate  25 mg Oral BID   mometasone-formoterol  2 puff Inhalation BID   pantoprazole (PROTONIX) IV  40 mg Intravenous Q12H   sodium chloride flush  3 mL Intravenous Q12H   traZODone  25 mg Oral QHS   Continuous Infusions:     LOS: 9 days    Osvaldo Shipper, MD [facility] P4/29/2024, 9:06 AM

## 2023-04-03 NOTE — Plan of Care (Signed)
  Problem: Clinical Measurements: Goal: Ability to maintain clinical measurements within normal limits will improve Outcome: Progressing   Problem: Pain Managment: Goal: General experience of comfort will improve Outcome: Progressing   Problem: Clinical Measurements: Goal: Ability to maintain clinical measurements within normal limits will improve Outcome: Progressing   Problem: Pain Managment: Goal: General experience of comfort will improve Outcome: Progressing   Problem: Activity: Goal: Risk for activity intolerance will decrease Outcome: Not Progressing

## 2023-04-03 NOTE — Progress Notes (Signed)
[facility] ([facility])   [facility] IDT continues to work on SOB symptom management. [facility] continues to follow as plan is for patient to DC with [facility] hospice services.     Please call with any questions/concerns.    Thank you for the opportunity to participate in this patient's care   Eugenie Birks, MSW [facility] Liaison  [phone]

## 2023-04-03 NOTE — Progress Notes (Signed)
Daily Progress Note   Patient Name: Charles Mckinney       Date: 04/03/2023 DOB: Feb 07, 1951  Age: 72 y.o. MRN#: [id number] Attending Physician: Osvaldo Shipper, MD Primary Care Physician: Virl Cagey, MD Admit Date: 03/24/2023 Length of Stay: 9 days  Reason for Consultation/Follow-up: Establishing goals of care  Subjective:   CC: Patient noting shortness of breath today.  Following up regarding complex medical decision making and symptom management.  Subjective: Reviewed EMR prior to seeing patient.  In the past 24 hours, patient has received IV morphine 2 mg x 1 dose and oxycodone 10 mg x 1 dose.  Patient was also started on trazodone 25 mg in the evening on 4/28 to assist with sleep.  Presented to bedside to see patient this afternoon.  No family present during visit.  Patient remains focused on his shortness of breath.  Patient cannot tell me if the oxycodone is assisting with his dyspnea though it does assist with his pain.  Discussed that hospitalist has also added further IV Lasix and Solu-Medrol to assist with symptom management.  Patient did not want to further discuss any other matters as his focus was on shortness of breath.  Provided emotional support via active listening.  All questions answered at that time.  Discussed care with bedside RN who was present during conversation.  Updated hospitalist regarding discussions.   Objective:   Vital Signs:  BP 116/71 (BP Location: Right Arm)   Pulse 87   Temp 98.5 F (36.9 C) (Oral)   Resp 17   Ht 5\' 10"  (1.778 m)   Wt 127.7 kg   SpO2 91%   BMI 40.39 kg/m   Physical Exam: General: NAD, laying in bed, chronically ill-appearing Eyes: No drainage noted Cardiovascular: tachycardia noted, anasarca present in all extremities  Respiratory: slightly increased work of breathing noted, not in respiratory distress, on [place] O2 Abdomen: obese Skin: no rashes or lesions on visible skin Neuro: Awake  Imaging:  I personally reviewed  recent imaging.   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 Since admission, GI has been consulted for workup of acute lower GI bleed. Patient also noted to have severe sepsis secondary to gram-negative bacteremia for which she is currently receiving antibiotics and is on pressor support. Palliative medicine team consulted to assist with complex medical decision making.   Recommendations/Plan: # Complex medical decision making/goals of care:  - During extensive discussion with patient and family on 4/25, determined patient is a high surgical risk and not appropriate for further cancer directed therapies such as chemotherapy.  Goal is for patient to return to his long-term care facility with hospice support.  TOC assisting with coordination and plan is for patient to return with [facility].   -Continue to provide support and reminders that hospice will also assist with his symptom management moving forward at the long-term care facility which patient is thankful for.  -  Code Status: Full Code   -Attempted to address CODE STATUS on 4/28 with patient and then later in day with family at bedside.  Patient wanting son, Charles Mckinney to make decision, and son not wanting to make decision, only support his  father's wishes for care. Continue conversations as able.   # Symptom management:  - Pain/Dyspnea   -Continue oxycodone 10mg  q4hrs prn   -Continue IV morphine for breakthrough   -Agree with use of lasix and steroids for management of concurrent medical conditions (CHF/COPD). May need to be on oral steroid moving forward once appropraite for  discharge.    -Insomnia   -Continue trazodone 25mg  qhs  Non-pharmacologic Delirium Precautions  - Frequent re-orientation; update board w/ date, names of treatment team  - Daytime stimulation: Open curtains, turn lights on, and turn on television as tolerated during waking hours  - Nighttime calm - close curtains, turn lights off, and turn off television at 9pm  with minimal interruptions from treatment team during sleeping hours, including avoiding lab draws if possible  - Encourage continued presence of family/friends  - Occupy w/ distractions - e.g. ask them to fold washcloths, busy-board  - Encourage movement with PT/OT as tolerated  - Ensure adequate sensorium, to include providing glasses, dentures, and hearing aids to patient as appropriate  - Assess and treat pain (incl nonverbal cues); e.g. consider scheduled tylenol  - Assess and treat constipation and urinary retention  - Ensure hydration, electrolyte balance (K to 4.0, Mg to 2.0), adequate oxygenation  - Review medications (addition, deletion, changes can trigger)   # Discharge Planning: Return to long term care facility with [facility] hospice when medically appropraite for discharge   Thank you for allowing the palliative care team to participate in the care Charles Mckinney.  Alvester Morin, DO Palliative Care Provider PMT # [phone]  This provider spent a total of 36 minutes providing patient's care.  Includes review of EMR, discussing care with other staff members involved in patient's medical care, obtaining relevant history and information from patient and/or patient's family, and personal review of imaging and lab work. Greater than 50% of the time was spent counseling and coordinating care related to the above assessment and plan.    *Please note that this is a verbal dictation therefore any spelling or grammatical errors are due to the "[employer]agon [employer] One" system interpretation.

## 2023-04-04 DIAGNOSIS — I5043 Acute on chronic combined systolic (congestive) and diastolic (congestive) heart failure: Secondary | ICD-10-CM | POA: Diagnosis not present

## 2023-04-04 DIAGNOSIS — R531 Weakness: Secondary | ICD-10-CM

## 2023-04-04 DIAGNOSIS — C186 Malignant neoplasm of descending colon: Secondary | ICD-10-CM | POA: Diagnosis not present

## 2023-04-04 DIAGNOSIS — L8990 Pressure ulcer of unspecified site, unspecified stage: Secondary | ICD-10-CM | POA: Insufficient documentation

## 2023-04-04 LAB — BASIC METABOLIC PANEL
Anion gap: 12 (ref 5–15)
BUN: 29 mg/dL — ABNORMAL HIGH (ref 8–23)
CO2: 24 mmol/L (ref 22–32)
Calcium: 8.3 mg/dL — ABNORMAL LOW (ref 8.9–10.3)
Chloride: 100 mmol/L (ref 98–111)
Creatinine, Ser: 1.21 mg/dL (ref 0.61–1.24)
GFR, Estimated: >60 mL/min (ref >60)
Glucose, Bld: 180 mg/dL — ABNORMAL HIGH (ref 70–99)
Potassium: 3.6 mmol/L (ref 3.5–5.1)
Sodium: 136 mmol/L (ref 135–145)

## 2023-04-04 LAB — CBC
HCT: 31.8 % — ABNORMAL LOW (ref 39.0–52.0)
Hemoglobin: 8.8 g/dL — ABNORMAL LOW (ref 13.0–17.0)
MCH: 24 pg — ABNORMAL LOW (ref 26.0–34.0)
MCHC: 27.7 g/dL — ABNORMAL LOW (ref 30.0–36.0)
MCV: 86.9 fL (ref 80.0–100.0)
Platelets: 456 10*3/uL — ABNORMAL HIGH (ref 150–400)
RBC: 3.66 MIL/uL — ABNORMAL LOW (ref 4.22–5.81)
RDW: 16.8 % — ABNORMAL HIGH (ref 11.5–15.5)
WBC: 11.1 10*3/uL — ABNORMAL HIGH (ref 4.0–10.5)
nRBC: 0 % (ref 0.0–0.2)

## 2023-04-04 MED ORDER — FUROSEMIDE 10 MG/ML IJ SOLN
80.0000 mg | Freq: Two times a day (BID) | INTRAMUSCULAR | Status: AC
  Administered 2023-04-04 (×2): 80 mg via INTRAVENOUS
  Filled 2023-04-04 (×2): qty 8

## 2023-04-04 MED ORDER — POTASSIUM CHLORIDE CRYS ER 20 MEQ PO TBCR
40.0000 meq | EXTENDED_RELEASE_TABLET | Freq: Once | ORAL | Status: AC
  Administered 2023-04-04: 40 meq via ORAL
  Filled 2023-04-04: qty 2

## 2023-04-04 MED ORDER — FUROSEMIDE 40 MG PO TABS
80.0000 mg | ORAL_TABLET | Freq: Two times a day (BID) | ORAL | Status: DC
  Administered 2023-04-05 (×2): 80 mg via ORAL
  Filled 2023-04-04 (×2): qty 2

## 2023-04-04 MED ORDER — PREDNISONE 50 MG PO TABS
60.0000 mg | ORAL_TABLET | Freq: Every day | ORAL | Status: DC
  Administered 2023-04-05 – 2023-04-07 (×3): 60 mg via ORAL
  Filled 2023-04-04 (×4): qty 1

## 2023-04-04 MED ORDER — IPRATROPIUM-ALBUTEROL 0.5-2.5 (3) MG/3ML IN SOLN
3.0000 mL | Freq: Two times a day (BID) | RESPIRATORY_TRACT | Status: DC
  Administered 2023-04-04 – 2023-04-08 (×8): 3 mL via RESPIRATORY_TRACT
  Filled 2023-04-04 (×7): qty 3

## 2023-04-04 NOTE — TOC Progression Note (Signed)
Transition of Care [facility]) - Progression Note    Patient Details  Name: Charles Mckinney MRN: [id number] Date of Birth: [birth year]  [facility] CM/SW Contact  Larrie Kass, LCSW Phone Number: 04/04/2023, 10:00 AM  Clinical Narrative:     CSW spoke with pt's son Minerva Areola about concerns with pt returning to the facility. He stated his father had concerns about the nursing care, but the plan is for him to return to [facility]. Pt' son is requesting EMS transport at the time of d/c. TOC to follow.   Expected Discharge Plan: Long Term Nursing Home Barriers to Discharge: Continued Medical Work up  Expected Discharge Plan and Services In-house Referral: Chaplain, Hospice / Palliative Care Discharge Planning Services: CM Consult   Living arrangements for the past 2 months: Skilled Nursing Facility ([facility] LTC patient)                 DME Arranged: N/A DME Agency: NA                   Social Determinants of Health (SDOH) Interventions SDOH Screenings   Food Insecurity: No Food Insecurity (03/25/2023)  Housing: Low Risk  (03/25/2023)  Transportation Needs: No Transportation Needs (03/25/2023)  Utilities: Not At Risk (03/25/2023)  Tobacco Use: Medium Risk (04/02/2023)    Readmission Risk Interventions     No data to display

## 2023-04-04 NOTE — Plan of Care (Signed)
  Problem: Pain Managment: Goal: General experience of comfort will improve Outcome: Progressing   Problem: Safety: Goal: Ability to remain free from injury will improve Outcome: Progressing   Problem: Skin Integrity: Goal: Risk for impaired skin integrity will decrease Outcome: Progressing   

## 2023-04-04 NOTE — Progress Notes (Signed)
Daily Progress Note   Patient Name: Charles Mckinney       Date: 04/04/2023 DOB: Jun 29, 1951  Age: 72 y.o. MRN#: [id number] Attending Physician: Osvaldo Shipper, MD Primary Care Physician: Virl Cagey, MD Admit Date: 03/24/2023 Length of Stay: 10 days  Reason for Consultation/Follow-up: Establishing goals of care  Subjective:   CC: Patient noting shortness of breath today.  Following up regarding complex medical decision making and symptom management.  Subjective: Reviewed EMR prior to seeing patient.     Patient asks for help with his tray, denies pain or any uncontrolled symptoms, simply states that he doesn't feel good overall.      Objective:   Vital Signs:  BP 135/64 (BP Location: Right Wrist)   Pulse (!) 116   Temp 97.7 F (36.5 C) (Oral)   Resp 20   Ht 5\' 10"  (1.778 m)   Wt 127.9 kg   SpO2 95%   BMI 40.46 kg/m   Physical Exam: General: NAD, laying in bed, chronically ill-appearing Eyes: No drainage noted Cardiovascular: tachycardia noted, anasarca present in all extremities  Respiratory: slightly increased work of breathing noted, not in respiratory distress, on [place] O2 Abdomen: obese Skin: no rashes or lesions on visible skin Neuro: Awake  Imaging:  I personally reviewed recent imaging.   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 Since admission, GI has been consulted for workup of acute lower GI bleed. Patient also noted to have severe sepsis secondary to gram-negative bacteremia for which she is currently receiving antibiotics and is on pressor support. Palliative medicine team consulted to assist with  complex medical decision making.   Recommendations/Plan: # Complex medical decision making/goals of care:  - During extensive discussion with patient and family on 4/25, determined patient is a high surgical risk and not appropriate for further cancer directed therapies such as chemotherapy.  Goal is for patient to return to his long-term care facility with hospice support.  TOC assisting with coordination and plan is for patient to return with [facility].   -Continue to provide support and reminders that hospice will also assist with his symptom management moving forward at the long-term care facility which patient is thankful for.  -  Code Status: Full Code     # Symptom management:  - Pain/Dyspnea   -Continue oxycodone 10mg  q4hrs prn   -Continue IV morphine for breakthrough   -Agree with use of lasix and steroids for management of concurrent medical conditions (CHF/COPD).        -Insomnia   -Continue trazodone 25mg  qhs  Non-pharmacologic Delirium Precautions  - Frequent re-orientation; update board w/ date, names of treatment team  - Daytime stimulation: Open curtains, turn lights on, and turn on television as tolerated during waking hours  - Nighttime calm - close curtains, turn lights off, and turn off television at 9pm  with minimal interruptions from treatment team during sleeping hours, including avoiding lab draws if possible  - Encourage continued presence of family/friends  - Occupy w/ distractions - e.g. ask them to fold washcloths, busy-board  - Encourage movement with PT/OT as tolerated  - Ensure adequate sensorium, to include providing glasses, dentures, and hearing  aids to patient as appropriate  - Assess and treat pain (incl nonverbal cues); e.g. consider scheduled tylenol  - Assess and treat constipation and urinary retention  - Ensure hydration, electrolyte balance (K to 4.0, Mg to 2.0), adequate oxygenation  - Review medications (addition, deletion, changes  can trigger)   # Discharge Planning: Return to long term care facility with [facility] hospice when medically appropraite for discharge   Thank you for allowing the palliative care team to participate in the care Charles Mckinney. Low MDM.  Rosalin Hawking MD Palliative Care Provider PMT # [phone]  This provider spent a total of 36 minutes providing patient's care.  Includes review of EMR, discussing care with other staff members involved in patient's medical care, obtaining relevant history and information from patient and/or patient's family, and personal review of imaging and lab work. Greater than 50% of the time was spent counseling and coordinating care related to the above assessment and plan.    *Please note that this is a verbal dictation therefore any spelling or grammatical errors are due to the "Dragon [employer] One" system interpretation.

## 2023-04-04 NOTE — Progress Notes (Signed)
PT refused cpap 

## 2023-04-04 NOTE — Progress Notes (Addendum)
PROGRESS NOTE  Charles Mckinney  ZOX:[id number] DOB: May 28, 1951 DOA: 03/24/2023 PCP: Virl Cagey, MD   Brief Narrative: Patient is a 72 year old male from [facility] with history of hypertension, COPD, chronic hypoxic respiratory failure on 2 L of oxygen , CKD stage IIIb, atrial fibrillation on Eliquis, combined systolic/diastolic CHF, OSA, AAA who presents to the emergency department with complaint of dark blood in the stool multiple times.  He noticed dark red blood in diaper multiple times over the past week.  Takes Eliquis.  On presentation, lab work showed potassium of 2.2, hemoglobin 7.4, creatinine of 1.9 . Patient was admitted for the management of  GI bleed, GI consulted. Hospital course remarkable for persistent A-fib with RVR, hypokalemia, melanotic stools.  Currently on amiodarone.  Cardiology following.  Colonoscopy showed large rectosigmoid partially obstructing mass ,likely malignant.  Patient was seen by medical oncology, general surgery and then palliative medicine.   Assessment & Plan:  Acute lower GI bleed Secondary to colon cancer. EGD showed nonbleeding gastric ulcer with a clean ulcer base.  Colonoscopy showed large rectosigmoid partially obstructing mass. Seems to have resolved.  Remains at high risk of rebleeding.  Rectosigmoid adenocarcinoma Colonoscopy showed large rectosigmoid partially obstructing mass. Biopsy positive for adenocarcinoma.  Seen by general surgery and medical oncology.  Not thought to be a candidate for any kind of surgical or medical intervention including chemotherapy. Seen by palliative medicine.  Plan is for hospice care going forward.    Persistent A-fib with RVR Was on Eliquis at home and metoprolol for rate control.   Eliquis on hold. Blood pressure was soft on 4/22 and he was not rapid A-fib so he was sent down to stepdown and started on amiodarone drip.  Patient is now on oral amiodarone. Eliquis cannot be resumed due to high risk of  rebleeding.  Cardiology has signed off.  Dyspnea Have shortness of breath.  Likely multifactorial including CHF as well as COPD.   Patient was started on IV furosemide as well as Solu-Medrol.  Patient was given nebulizer treatment and Dulera. Seems to be slightly better today.  Has diuresed.  Will give 2 more doses of IV steroids today and then start on oral steroids from tomorrow.  Will leave on Solu-Medrol for today and then start prednisone tomorrow with the slow taper. Continues to require 3-4 L of oxygen.  Likely need to go to skilled nursing facility with oxygen. Morphine for air hunger.    Recently done CT scan reviewed and raised concern for left-sided consolidation/pleural effusion.    Gram-negative bacteremia likely from urinary source Had mild leukocytosis, became febrile on 4/21.   Urine culture/blood cultures showing E. coli.   Patient was placed on ceftriaxone.  Due to persistent findings of possible consolidation in the chest x-ray on the left side we will continue with ceftriaxone and give him a 10-day treatment.  Today is day 9.   AKI in CKD stage IIIa Baseline creatinine was 1.5, 3 years ago . Presented with creatinine of 1.9.  Most likely prerenal in the setting of blood loss.  Renal function back to baseline.  Acute on chronic combined systolic/diastolic CHF Echo done on 10/2018 showed EF of 30 to 35%, diffuse hypokinesis. Has chronic edema bilateral lower extremities.  Patient was seen by cardiology.  Now they have signed off. Patient was on Bumex 4 mg by mouth daily and then 2 mg as needed. Currently on IV furosemide.  Switch to oral furosemide from tomorrow.  Normocytic anemia/acute  blood loss anemia Low hemoglobin multifactorial including GI loss from colonic mass.  Stable for the last several days.   Hypokalemia Give additional dose today.   OSA On CPAP   COPD See above.  Continue with inhaled steroids.   Elevated alkaline phosphatase Elevated GGT. liver  enzymes normal.  Right upper quadrant ultrasound showed hepatic steatosis  Hyponatremia Stable  Left upper extremity edema No DVT identified.  Superficial vein thrombosis was noted.   Debility/deconditioning/obesity Morbidly obese patient.  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  Now has colorectal cancer, permanent A-fib, E. coli bacteremia.  Ambulates with the help of wheelchair.  Lives at a SNF, [facility] consulted.  Very poor quality of life due to several comorbidities.   Estimated body mass index is 40.46 kg/m as calculated from the following:   Height as of this encounter: 5\' 10"  (1.778 m).   Weight as of this encounter: 127.9 kg.   Goals of care Palliative medicine is following.  Plan is for discharge with hospice services.   Remains full code.     DVT prophylaxis:SCDs Start: 03/25/23 0408 Code Status: Full Code Family Communication: No family at bedside Disposition: Plan is for back to SNF when medically improved.  Hopefully tomorrow.    Consultants: GI, cardiology, palliative care, general surgery,oncology  Procedures: EGD/colonoscopy  Antimicrobials:  Anti-infectives (From admission, onward)    Start     Dose/Rate Route Frequency Ordered Stop   04/03/23 1000  cefTRIAXone (ROCEPHIN) 2 g in sodium chloride 0.9 % 100 mL IVPB        2 g 200 mL/hr over 30 Minutes Intravenous Every 24 hours 04/03/23 0914 04/06/23 0959   03/27/23 1000  cefTRIAXone (ROCEPHIN) 2 g in sodium chloride 0.9 % 100 mL IVPB        2 g 200 mL/hr over 30 Minutes Intravenous Every 24 hours 03/27/23 0737 04/02/23 1034   03/26/23 1600  cefTRIAXone (ROCEPHIN) 2 g in sodium chloride 0.9 % 100 mL IVPB  Status:  Discontinued        2 g 200 mL/hr over 30 Minutes Intravenous Every 24 hours 03/26/23 1510 03/27/23 0737   03/26/23 1600  azithromycin (ZITHROMAX) 500 mg in sodium chloride 0.9 % 250 mL IVPB  Status:  Discontinued        500 mg 250 mL/hr over 60 Minutes Intravenous Every 24 hours  03/26/23 1510 03/27/23 0736       Subjective: Patient mentions that shortness of breath is improving.  Denies any chest pain.  No nausea or vomiting.  Objective: Vitals:   04/04/23 0243 04/04/23 0411 04/04/23 0532 04/04/23 0748  BP:  135/64    Pulse:  (!) 110    Resp:  20    Temp:  97.7 F (36.5 C)    TempSrc:  Oral    SpO2: 92% 93%  95%  Weight:   127.9 kg   Height:        Intake/Output Summary (Last 24 hours) at 04/04/2023 0916 Last data filed at 04/04/2023 0529 Gross per 24 hour  Intake 450 ml  Output 1400 ml  Net -950 ml    Filed Weights   04/01/23 0500 04/03/23 0447 04/04/23 0532  Weight: 131.6 kg 127.7 kg 127.9 kg    Examination:  General appearance: Awake alert.  In no distress Resp: Less tachypneic.  Few crackles at the bases.  Better than yesterday.  No wheezing or rhonchi. Cardio: S1-S2 is normal regular.  No S3-S4.  No rubs  murmurs or bruit GI: Abdomen is soft.  Nontender nondistended.  Bowel sounds are present normal.  No masses organomegaly Extremities: Physical deconditioning noted.  No significant pedal edema.  Data Reviewed: I have personally reviewed following labs and imaging studies  CBC: Recent Labs  Lab 03/29/23 0255 03/30/23 0811 03/31/23 0259 04/03/23 0933 04/04/23 0446  WBC 9.8 11.1* 10.0 11.7* 11.1*  HGB 8.4* 9.5* 8.8* 9.3* 8.8*  HCT 29.8* 33.3* 30.7* 32.7* 31.8*  MCV 90.3 89.0 88.0 86.5 86.9  PLT 448* 454* 426* 486* 456*    Basic Metabolic Panel: Recent Labs  Lab 03/30/23 0811 03/31/23 0259 04/02/23 0734 04/03/23 0933 04/04/23 0446  NA 133* 132* 136 137 136  K 3.7 3.4* 3.2* 4.1 3.6  CL 100 100 101 103 100  CO2 22 21* 21* 24 24  GLUCOSE 121* 113* 127* 152* 180*  BUN 17 17 19 23  29*  CREATININE 1.23 1.03 1.01 1.19 1.21  CALCIUM 8.2* 8.1* 8.2* 8.6* 8.3*  MG  --   --  2.1  --   --   PHOS  --   --  3.9  --   --       Recent Results (from the past 240 hour(s))  MRSA Next Gen by PCR, Nasal     Status: None    Collection Time: 03/25/23  3:31 PM   Specimen: Nasal Mucosa; Nasal Swab  Result Value Ref Range Status   MRSA by PCR Next Gen NOT DETECTED NOT DETECTED Final    Comment: (NOTE) The GeneXpert MRSA Assay (FDA approved for NASAL specimens only), is one component of a comprehensive MRSA colonization surveillance program. It is not intended to diagnose MRSA infection nor to guide or monitor treatment for MRSA infections. Test performance is not FDA approved in patients less than 71 years old. Performed at [facility], [address], [place], [place] [address]   Urine Culture (for pregnant, neutropenic or urologic patients or patients with an indwelling urinary catheter)     Status: Abnormal   Collection Time: 03/26/23 10:39 AM   Specimen: Urine, Clean Catch  Result Value Ref Range Status   Specimen Description   Final    URINE, CLEAN CATCH Performed at [facility], [address] [place], [place] [address]    Special Requests   Final    NONE Performed at [facility], [address], [place], [place] [address]    Culture >=100,000 COLONIES/mL ESCHERICHIA COLI (A)  Final   Report Status 03/28/2023 FINAL  Final   Organism ID, Bacteria ESCHERICHIA COLI (A)  Final      Susceptibility   Escherichia coli - MIC*    AMPICILLIN >=32 RESISTANT Resistant     CEFAZOLIN <=4 SENSITIVE Sensitive     CEFEPIME <=0.12 SENSITIVE Sensitive     CEFTRIAXONE <=0.25 SENSITIVE Sensitive     CIPROFLOXACIN <=0.25 SENSITIVE Sensitive     GENTAMICIN <=1 SENSITIVE Sensitive     IMIPENEM <=0.25 SENSITIVE Sensitive     NITROFURANTOIN <=16 SENSITIVE Sensitive     TRIMETH/SULFA >=320 RESISTANT Resistant     AMPICILLIN/SULBACTAM 8 SENSITIVE Sensitive     PIP/TAZO <=4 SENSITIVE Sensitive     * >=100,000 COLONIES/mL ESCHERICHIA COLI  Culture, blood (Routine X 2) w Reflex to ID Panel     Status: Abnormal   Collection Time: 03/26/23  2:36 PM   Specimen:  BLOOD  Result Value Ref Range Status   Specimen Description   Final  BLOOD BLOOD LEFT ARM Performed at [facility], [address], [place], [place] [address]    Special Requests   Final    AEROBIC BOTTLE ONLY Blood Culture adequate volume Performed at [facility], [address], [place], [place] [address]    Culture  Setup Time   Final    GRAM NEGATIVE RODS AEROBIC BOTTLE ONLY CRITICAL RESULT CALLED TO, READ BACK BY AND VERIFIED WITH: PHARMD N. GLOGOVAC 03/27/23 @ 0625 BY AB Performed at [facility], [address] [place], [place] [address]    Culture ESCHERICHIA COLI (A)  Final   Report Status 03/29/2023 FINAL  Final   Organism ID, Bacteria ESCHERICHIA COLI  Final      Susceptibility   Escherichia coli - MIC*    AMPICILLIN >=32 RESISTANT Resistant     CEFEPIME <=0.12 SENSITIVE Sensitive     CEFTAZIDIME <=1 SENSITIVE Sensitive     CEFTRIAXONE <=0.25 SENSITIVE Sensitive     CIPROFLOXACIN <=0.25 SENSITIVE Sensitive     GENTAMICIN <=1 SENSITIVE Sensitive     IMIPENEM <=0.25 SENSITIVE Sensitive     TRIMETH/SULFA >=320 RESISTANT Resistant     AMPICILLIN/SULBACTAM 8 SENSITIVE Sensitive     PIP/TAZO <=4 SENSITIVE Sensitive     * ESCHERICHIA COLI  Blood Culture ID Panel (Reflexed)     Status: Abnormal   Collection Time: 03/26/23  2:36 PM  Result Value Ref Range Status   Enterococcus faecalis NOT DETECTED NOT DETECTED Final   Enterococcus Faecium NOT DETECTED NOT DETECTED Final   Listeria monocytogenes NOT DETECTED NOT DETECTED Final   Staphylococcus species NOT DETECTED NOT DETECTED Final   Staphylococcus aureus (BCID) NOT DETECTED NOT DETECTED Final   Staphylococcus epidermidis NOT DETECTED NOT DETECTED Final   Staphylococcus lugdunensis NOT DETECTED NOT DETECTED Final   Streptococcus species NOT DETECTED NOT DETECTED Final   Streptococcus agalactiae NOT DETECTED NOT DETECTED Final   Streptococcus pneumoniae NOT DETECTED NOT  DETECTED Final   Streptococcus pyogenes NOT DETECTED NOT DETECTED Final   A.calcoaceticus-baumannii NOT DETECTED NOT DETECTED Final   Bacteroides fragilis NOT DETECTED NOT DETECTED Final   Enterobacterales DETECTED (A) NOT DETECTED Final    Comment: Enterobacterales represent a large order of gram negative bacteria, not a single organism. CRITICAL RESULT CALLED TO, READ BACK BY AND VERIFIED WITH: PHARMD N. GLOGOVAC 03/27/23 @ 0625 BY AB    Enterobacter cloacae complex NOT DETECTED NOT DETECTED Final   Escherichia coli DETECTED (A) NOT DETECTED Final    Comment: CRITICAL RESULT CALLED TO, READ BACK BY AND VERIFIED WITH: PHARMD N. GLOGOVAC 03/27/23 @ 0625 BY AB    Klebsiella aerogenes NOT DETECTED NOT DETECTED Final   Klebsiella oxytoca NOT DETECTED NOT DETECTED Final   Klebsiella pneumoniae NOT DETECTED NOT DETECTED Final   Proteus species NOT DETECTED NOT DETECTED Final   Salmonella species NOT DETECTED NOT DETECTED Final   Serratia marcescens NOT DETECTED NOT DETECTED Final   Haemophilus influenzae NOT DETECTED NOT DETECTED Final   Neisseria meningitidis NOT DETECTED NOT DETECTED Final   Pseudomonas aeruginosa NOT DETECTED NOT DETECTED Final   Stenotrophomonas maltophilia NOT DETECTED NOT DETECTED Final   Candida albicans NOT DETECTED NOT DETECTED Final   Candida auris NOT DETECTED NOT DETECTED Final   Candida glabrata NOT DETECTED NOT DETECTED Final   Candida krusei NOT DETECTED NOT DETECTED Final   Candida parapsilosis NOT DETECTED NOT DETECTED Final   Candida tropicalis NOT DETECTED NOT DETECTED Final   Cryptococcus neoformans/gattii  NOT DETECTED NOT DETECTED Final   CTX-M ESBL NOT DETECTED NOT DETECTED Final   Carbapenem resistance IMP NOT DETECTED NOT DETECTED Final   Carbapenem resistance KPC NOT DETECTED NOT DETECTED Final   Carbapenem resistance NDM NOT DETECTED NOT DETECTED Final   Carbapenem resist OXA 48 LIKE NOT DETECTED NOT DETECTED Final   Carbapenem resistance VIM  NOT DETECTED NOT DETECTED Final    Comment: Performed at [facility], [address], [place], [place] [address]  Culture, blood (Routine X 2) w Reflex to ID Panel     Status: None   Collection Time: 03/26/23  4:52 PM   Specimen: BLOOD  Result Value Ref Range Status   Specimen Description   Final    BLOOD BLOOD LEFT ARM Performed at [facility], [address], [place], [place] [address]    Special Requests   Final    AEROBIC BOTTLE ONLY Blood Culture adequate volume Performed at [facility], [address] [place], [place] [address]    Culture   Final    NO GROWTH 5 DAYS Performed at [facility], [address] [place], [place] [address]    Report Status 03/31/2023 FINAL  Final     Radiology Studies: No results found.  Scheduled Meds:  amiodarone  200 mg Oral Daily   Chlorhexidine Gluconate Cloth  6 each Topical Daily   feeding supplement  237 mL Oral BID BM   furosemide  80 mg Intravenous BID   ipratropium-albuterol  3 mL Nebulization BID   levothyroxine  100 mcg Oral Q0600   methylPREDNISolone (SOLU-MEDROL) injection  40 mg Intravenous Q12H   metoprolol tartrate  25 mg Oral BID   mometasone-formoterol  2 puff Inhalation BID   pantoprazole (PROTONIX) IV  40 mg Intravenous Q12H   sodium chloride flush  3 mL Intravenous Q12H   traZODone  25 mg Oral QHS   Continuous Infusions:  cefTRIAXone (ROCEPHIN)  IV Stopped (04/03/23 2144)      LOS: 10 days    Osvaldo Shipper, MD [facility] P4/30/2024, 9:16 AM

## 2023-04-05 DIAGNOSIS — K922 Gastrointestinal hemorrhage, unspecified: Secondary | ICD-10-CM | POA: Diagnosis not present

## 2023-04-05 LAB — BASIC METABOLIC PANEL
Anion gap: 12 (ref 5–15)
BUN: 33 mg/dL — ABNORMAL HIGH (ref 8–23)
CO2: 23 mmol/L (ref 22–32)
Calcium: 8.5 mg/dL — ABNORMAL LOW (ref 8.9–10.3)
Chloride: 102 mmol/L (ref 98–111)
Creatinine, Ser: 1.13 mg/dL (ref 0.61–1.24)
GFR, Estimated: >60 mL/min (ref >60)
Glucose, Bld: 168 mg/dL — ABNORMAL HIGH (ref 70–99)
Potassium: 4 mmol/L (ref 3.5–5.1)
Sodium: 137 mmol/L (ref 135–145)

## 2023-04-05 MED ORDER — PROCHLORPERAZINE EDISYLATE 10 MG/2ML IJ SOLN
10.0000 mg | Freq: Four times a day (QID) | INTRAMUSCULAR | Status: DC | PRN
  Administered 2023-04-05 – 2023-04-08 (×3): 10 mg via INTRAVENOUS
  Filled 2023-04-05 (×3): qty 2

## 2023-04-05 MED ORDER — LIP MEDEX EX OINT: TOPICAL_OINTMENT | CUTANEOUS | Status: DC | PRN

## 2023-04-05 NOTE — Progress Notes (Signed)
Civil engineer, contracting [facility]) Hospital Liaison Note  ACC will follow patient at his facility. Please call us when he discharges.   Call with any questions or concerns. Thank you  Dionicio Stall, [facility] Liaison [phone]

## 2023-04-05 NOTE — Progress Notes (Signed)
Request obtained from unit secretary to call son. Called, no answer. RN left brief message without mentioning pt's name, & provided phone number for a return call.

## 2023-04-05 NOTE — Progress Notes (Signed)
Pt c/o of inability to breath. Pulse Ox @ 91 3 L [place]. Increased to 4 L & O2, raised PaO2 94%. Called RT & requested a PRN breathing treatment. Agreed to treat.

## 2023-04-05 NOTE — Progress Notes (Signed)
PROGRESS NOTE  Charles Mckinney  ZOX:[id number] DOB: 25-Feb-1951 DOA: 03/24/2023 PCP: Virl Cagey, MD   Brief Narrative: Patient is a 72 year old male from [facility] with history of hypertension, COPD, chronic hypoxic respiratory failure on 2 L of oxygen , CKD stage IIIb, atrial fibrillation on Eliquis, combined systolic/diastolic CHF, OSA, AAA who presents to the emergency department with complaint of dark blood in the stool multiple times.  He noticed dark red blood in diaper multiple times over the past week.  Takes Eliquis.  On presentation, lab work showed potassium of 2.2, hemoglobin 7.4, creatinine of 1.9 . He denies any nausea, abdomen pain or vomiting.  Blood pressure was soft on presentation.  Patient was admitted for the management of  GI bleed, GI consulted. Hospital course remarkable for persistent A-fib with RVR, hypokalemia, melanotic stools.  Colonoscopy showed large rectosigmoid partially obstructing mass ,likely malignant.  Cardiology,palliative care,  General surgery following, Oncology also consulted .  Extensive goals of care was discussed with family.  Current plan is for discharge back to [facility] with hospice,likely tomorrow if remains stable .  Assessment & Plan:  Acute lower GI bleed: Presented with hematochezia, dark stool.  Hemoglobin of 7.4 on presentation.  Given 2 units of blood transfusion.   Hemoglobin now stable in the range of 8-9 . On Protonix. EGD showed nonbleeding gastric ulcer with a clean ulcer base.  Colonoscopy showed large rectosigmoid partially obstructing mass .On soft diet  Rectosigmoid adenocarcinoma: Colonoscopy showed large rectosigmoid partially obstructing mass .biopsy positive for adenocarcinoma.  CT chest/abdomen/pelvis with contrast showed solitary right upper lobe 5 mm pulmonary nodule . Oncology, general surgery, GI were following.  He is not a candidate for surgical or medical intervention.  Plan is for hospice approach  Persistent  A-fib with RVR: On Eliquis at home and metoprolol for rate control.   Eliquis on hold.  Cardiology was following.  Currently on amiodarone.  Eliquis cannot be continued due to high risk for rebleeding  Gram-negative bacteremia: Had mild leukocytosis, became febrile on 4/21.  Urine culture/blood cultures showing E. coli.  Treated with ceftriaxone.  Course of ceftriaxone extended due to finding of possible consolidation in the left lung   AKI in CKD stage IIIa: Baseline creatinine was 1.5 ,3 years ago . Presented with creatinine of 1.9.  Most likely prerenal in the setting of blood loss.  antihypertensives on hold.  Kidney function has normalized.  Acute on chronic Combined systolic/diastolic CHF: Echo done on 10/2018 showed EF of 30 to 35%, diffuse hypokinesis. Has chronic edema bilateral lower extremities.  Cardiology was consulted and following.  Switch to oral Lasix   Hypokalemia: Being aggressively supplemented and monitored.     OSA: On CPAP   COPD: Not in exacerbation.  Uses 2L of at home.   Elevated alkaline phosphatase: Elevated GGT. liver enzymes normal.  Right upper quadrant ultrasound showed hepatic steatosis  Left upper extremity edema: Negative venous Doppler   Debility/deconditioning/obesity: Morbidly obese patient.  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  Now found to have  colorectal cancer, permanent A-fib, E. coli bacteremia.  Ambulates with the help of wheelchair.  Lives at a SNF, [facility] consulted.  Very poor quality of life due to several comorbidities.  Still full code.  Palliaitve care were following.  Plan is to discharge to SNF with hospice  Nutrition Problem: Increased nutrient needs Etiology: chronic illness (COPD, CKD, CHF) Pressure Injury 03/25/23 Buttocks Right Stage 2 -  Partial thickness  loss of dermis presenting as a shallow open injury with a red, pink wound bed without slough. (Active)  03/25/23 1522  Location: Buttocks  Location Orientation:  Right  Staging: Stage 2 -  Partial thickness loss of dermis presenting as a shallow open injury with a red, pink wound bed without slough.  Wound Description (Comments):   Present on Admission: Yes  Dressing Type None 04/04/23 0816     Pressure Injury 03/25/23 Buttocks Right Stage 2 -  Partial thickness loss of dermis presenting as a shallow open injury with a red, pink wound bed without slough. (Active)  03/25/23 1523  Location: Buttocks  Location Orientation: Right  Staging: Stage 2 -  Partial thickness loss of dermis presenting as a shallow open injury with a red, pink wound bed without slough.  Wound Description (Comments):   Present on Admission: Yes  Dressing Type None 04/04/23 0816    DVT prophylaxis:SCDs Start: 03/25/23 0408     Code Status: Full Code  Family Communication:None at bedside  Patient status: Inpatient  Patient is from : SNF  Anticipated discharge to: SNF  Estimated DC date: tomorrow  Consultants: GI, cardiology, palliative care, general surgery,oncology  Procedures: EGD/colonoscopy  Antimicrobials:  Anti-infectives (From admission, onward)    Start     Dose/Rate Route Frequency Ordered Stop   04/03/23 1000  cefTRIAXone (ROCEPHIN) 2 g in sodium chloride 0.9 % 100 mL IVPB        2 g 200 mL/hr over 30 Minutes Intravenous Every 24 hours 04/03/23 0914 04/06/23 0959   03/27/23 1000  cefTRIAXone (ROCEPHIN) 2 g in sodium chloride 0.9 % 100 mL IVPB        2 g 200 mL/hr over 30 Minutes Intravenous Every 24 hours 03/27/23 0737 04/02/23 1034   03/26/23 1600  cefTRIAXone (ROCEPHIN) 2 g in sodium chloride 0.9 % 100 mL IVPB  Status:  Discontinued        2 g 200 mL/hr over 30 Minutes Intravenous Every 24 hours 03/26/23 1510 03/27/23 0737   03/26/23 1600  azithromycin (ZITHROMAX) 500 mg in sodium chloride 0.9 % 250 mL IVPB  Status:  Discontinued        500 mg 250 mL/hr over 60 Minutes Intravenous Every 24 hours 03/26/23 1510 03/27/23 0736        Subjective: Patient seen and examined at bedside today.  Hemodynamically stable.  Lying in bed.  Complains of not feeling good but did not specify what is bothering him.  Patient overall looks comfortable, on 3 L of oxygen, speaking in full sentences and did not appear short of breath. Objective: Vitals:   04/04/23 2005 04/05/23 0436 04/05/23 0500 04/05/23 0758  BP: 112/70 104/79    Pulse: (!) 106 92    Resp: 20 20    Temp: 98 F (36.7 C) 97.7 F (36.5 C)    TempSrc: Oral Oral    SpO2: 94% 94%  95%  Weight:   126.4 kg   Height:        Intake/Output Summary (Last 24 hours) at 04/05/2023 0932 Last data filed at 04/05/2023 0700 Gross per 24 hour  Intake 1650 ml  Output 2075 ml  Net -425 ml   Filed Weights   04/03/23 0447 04/04/23 0532 04/05/23 0500  Weight: 127.7 kg 127.9 kg 126.4 kg    Examination:   General exam: Overall comfortable, not in distress, very deconditioned, morbidly obese HEENT: PERRL Respiratory system: Diminished sounds on bases, no wheezes or crackles Cardiovascular system: Irregularly  irregular rhythm.  Gastrointestinal system: Abdomen is obese, soft and nontender. Central nervous system: Alert and mostly oriented Extremities: Trace bilateral lower EXTR edema, no clubbing ,no cyanosis Skin: Sacral pressure ulcer, erythematous rash on bilateral lower extremities  Data Reviewed: I have personally reviewed following labs and imaging studies  CBC: Recent Labs  Lab 03/30/23 0811 03/31/23 0259 04/03/23 0933 04/04/23 0446  WBC 11.1* 10.0 11.7* 11.1*  HGB 9.5* 8.8* 9.3* 8.8*  HCT 33.3* 30.7* 32.7* 31.8*  MCV 89.0 88.0 86.5 86.9  PLT 454* 426* 486* 456*   Basic Metabolic Panel: Recent Labs  Lab 03/31/23 0259 04/02/23 0734 04/03/23 0933 04/04/23 0446 04/05/23 0433  NA 132* 136 137 136 137  K 3.4* 3.2* 4.1 3.6 4.0  CL 100 101 103 100 102  CO2 21* 21* 24 24 23   GLUCOSE 113* 127* 152* 180* 168*  BUN 17 19 23  29* 33*  CREATININE 1.03 1.01 1.19  1.21 1.13  CALCIUM 8.1* 8.2* 8.6* 8.3* 8.5*  MG  --  2.1  --   --   --   PHOS  --  3.9  --   --   --      Recent Results (from the past 240 hour(s))  Urine Culture (for pregnant, neutropenic or urologic patients or patients with an indwelling urinary catheter)     Status: Abnormal   Collection Time: 03/26/23 10:39 AM   Specimen: Urine, Clean Catch  Result Value Ref Range Status   Specimen Description   Final    URINE, CLEAN CATCH Performed at [facility], [address] [place], [place] [address]    Special Requests   Final    NONE Performed at [facility], [address], [place], [place] [address]    Culture >=100,000 COLONIES/mL ESCHERICHIA COLI (A)  Final   Report Status 03/28/2023 FINAL  Final   Organism ID, Bacteria ESCHERICHIA COLI (A)  Final      Susceptibility   Escherichia coli - MIC*    AMPICILLIN >=32 RESISTANT Resistant     CEFAZOLIN <=4 SENSITIVE Sensitive     CEFEPIME <=0.12 SENSITIVE Sensitive     CEFTRIAXONE <=0.25 SENSITIVE Sensitive     CIPROFLOXACIN <=0.25 SENSITIVE Sensitive     GENTAMICIN <=1 SENSITIVE Sensitive     IMIPENEM <=0.25 SENSITIVE Sensitive     NITROFURANTOIN <=16 SENSITIVE Sensitive     TRIMETH/SULFA >=320 RESISTANT Resistant     AMPICILLIN/SULBACTAM 8 SENSITIVE Sensitive     PIP/TAZO <=4 SENSITIVE Sensitive     * >=100,000 COLONIES/mL ESCHERICHIA COLI  Culture, blood (Routine X 2) w Reflex to ID Panel     Status: Abnormal   Collection Time: 03/26/23  2:36 PM   Specimen: BLOOD  Result Value Ref Range Status   Specimen Description   Final    BLOOD BLOOD LEFT ARM Performed at [facility], [address] [place], [place] [address]    Special Requests   Final    AEROBIC BOTTLE ONLY Blood Culture adequate volume Performed at [facility], [address] [place], [place] [address]    Culture  Setup Time   Final    GRAM NEGATIVE RODS AEROBIC BOTTLE  ONLY CRITICAL RESULT CALLED TO, READ BACK BY AND VERIFIED WITH: PHARMD N. GLOGOVAC 03/27/23 @ 0625 BY AB Performed at [facility], [address] [place], [place] [address]    Culture ESCHERICHIA COLI (A)  Final   Report Status 03/29/2023 FINAL  Final   Organism ID, Bacteria ESCHERICHIA COLI  Final      Susceptibility   Escherichia coli - MIC*    AMPICILLIN >=32 RESISTANT Resistant     CEFEPIME <=0.12 SENSITIVE Sensitive     CEFTAZIDIME <=1 SENSITIVE Sensitive     CEFTRIAXONE <=0.25 SENSITIVE Sensitive     CIPROFLOXACIN <=0.25 SENSITIVE Sensitive     GENTAMICIN <=1 SENSITIVE Sensitive     IMIPENEM <=0.25 SENSITIVE Sensitive     TRIMETH/SULFA >=320 RESISTANT Resistant     AMPICILLIN/SULBACTAM 8 SENSITIVE Sensitive     PIP/TAZO <=4 SENSITIVE Sensitive     * ESCHERICHIA COLI  Blood Culture ID Panel (Reflexed)     Status: Abnormal   Collection Time: 03/26/23  2:36 PM  Result Value Ref Range Status   Enterococcus faecalis NOT DETECTED NOT DETECTED Final   Enterococcus Faecium NOT DETECTED NOT DETECTED Final   Listeria monocytogenes NOT DETECTED NOT DETECTED Final   Staphylococcus species NOT DETECTED NOT DETECTED Final   Staphylococcus aureus (BCID) NOT DETECTED NOT DETECTED Final   Staphylococcus epidermidis NOT DETECTED NOT DETECTED Final   Staphylococcus lugdunensis NOT DETECTED NOT DETECTED Final   Streptococcus species NOT DETECTED NOT DETECTED Final   Streptococcus agalactiae NOT DETECTED NOT DETECTED Final   Streptococcus pneumoniae NOT DETECTED NOT DETECTED Final   Streptococcus pyogenes NOT DETECTED NOT DETECTED Final   A.calcoaceticus-baumannii NOT DETECTED NOT DETECTED Final   Bacteroides fragilis NOT DETECTED NOT DETECTED Final   Enterobacterales DETECTED (A) NOT DETECTED Final    Comment: Enterobacterales represent a large order of gram negative bacteria, not a single organism. CRITICAL RESULT CALLED TO, READ BACK BY AND VERIFIED WITH: PHARMD N. GLOGOVAC 03/27/23  @ 0625 BY AB    Enterobacter cloacae complex NOT DETECTED NOT DETECTED Final   Escherichia coli DETECTED (A) NOT DETECTED Final    Comment: CRITICAL RESULT CALLED TO, READ BACK BY AND VERIFIED WITH: PHARMD N. GLOGOVAC 03/27/23 @ 0625 BY AB    Klebsiella aerogenes NOT DETECTED NOT DETECTED Final   Klebsiella oxytoca NOT DETECTED NOT DETECTED Final   Klebsiella pneumoniae NOT DETECTED NOT DETECTED Final   Proteus species NOT DETECTED NOT DETECTED Final   Salmonella species NOT DETECTED NOT DETECTED Final   Serratia marcescens NOT DETECTED NOT DETECTED Final   Haemophilus influenzae NOT DETECTED NOT DETECTED Final   Neisseria meningitidis NOT DETECTED NOT DETECTED Final   Pseudomonas aeruginosa NOT DETECTED NOT DETECTED Final   Stenotrophomonas maltophilia NOT DETECTED NOT DETECTED Final   Candida albicans NOT DETECTED NOT DETECTED Final   Candida auris NOT DETECTED NOT DETECTED Final   Candida glabrata NOT DETECTED NOT DETECTED Final   Candida krusei NOT DETECTED NOT DETECTED Final   Candida parapsilosis NOT DETECTED NOT DETECTED Final   Candida tropicalis NOT DETECTED NOT DETECTED Final   Cryptococcus neoformans/gattii NOT DETECTED NOT DETECTED Final   CTX-M ESBL NOT DETECTED NOT DETECTED Final   Carbapenem resistance IMP NOT DETECTED NOT DETECTED Final   Carbapenem resistance KPC NOT DETECTED NOT DETECTED Final   Carbapenem resistance NDM NOT DETECTED NOT DETECTED Final   Carbapenem resist OXA 48 LIKE NOT DETECTED NOT DETECTED Final   Carbapenem resistance VIM NOT DETECTED NOT DETECTED Final    Comment: Performed at [facility], [address] [place], [place] [address]  Culture, blood (Routine X 2) w Reflex to ID Panel     Status: None   Collection Time: 03/26/23  4:52 PM   Specimen: BLOOD  Result  Value Ref Range Status   Specimen Description   Final    BLOOD BLOOD LEFT ARM Performed at [facility], [address], [place], [place] [address]     Special Requests   Final    AEROBIC BOTTLE ONLY Blood Culture adequate volume Performed at [facility], [address], [place], [place] [address]    Culture   Final    NO GROWTH 5 DAYS Performed at [facility], [address] [place], [place] [address]    Report Status 03/31/2023 FINAL  Final     Radiology Studies: No results found.  Scheduled Meds:  amiodarone  200 mg Oral Daily   Chlorhexidine Gluconate Cloth  6 each Topical Daily   feeding supplement  237 mL Oral BID BM   furosemide  80 mg Oral BID   ipratropium-albuterol  3 mL Nebulization BID   levothyroxine  100 mcg Oral Q0600   metoprolol tartrate  25 mg Oral BID   mometasone-formoterol  2 puff Inhalation BID   pantoprazole (PROTONIX) IV  40 mg Intravenous Q12H   predniSONE  60 mg Oral Q breakfast   sodium chloride flush  3 mL Intravenous Q12H   traZODone  25 mg Oral QHS   Continuous Infusions:  cefTRIAXone (ROCEPHIN)  IV 2 g (04/04/23 1059)     LOS: 11 days   Burnadette Pop, MD [facility] P5/12/2022, 9:32 AM

## 2023-04-05 NOTE — Progress Notes (Signed)
Patient continues to refuse CPAP qhs.   

## 2023-04-05 NOTE — Progress Notes (Signed)
Pt verbalizing inability to breath, after last breathing tx. Turned pt to right side with pillows propped on left. O2 dropped on 4 L /[place] to 88%. Increased O2 to 4.5 L/[place]. Still paO2 89%. Elevated HOB to 40 degrees & increased O2 5 L. Pa o2 increased 93%. Notified RT & will continue pulse ox checks.

## 2023-04-05 NOTE — Progress Notes (Signed)
Daily Progress Note   Patient Name: Charles Mckinney       Date: 04/05/2023 DOB: 1951/01/10  Age: 72 y.o. MRN#: [id number] Attending Physician: Burnadette Pop, MD Primary Care Physician: Virl Cagey, MD Admit Date: 03/24/2023 Length of Stay: 11 days  Reason for Consultation/Follow-up: Establishing goals of care  Subjective:   CC: Patient is resting in bed, denies complaints but appears chronically ill.  Following up regarding complex medical decision making and symptom management.  Subjective: Reviewed EMR prior to seeing patient.   Utilizing IV morphine as well as oral oxycodone.    Objective:   Vital Signs:  BP 138/79 (BP Location: Right Arm)   Pulse 70   Temp 97.6 F (36.4 C) (Oral)   Resp (!) 22   Ht 5\' 10"  (1.778 m)   Wt 126.4 kg   SpO2 (!) 89%   BMI 39.98 kg/m   Physical Exam: General: NAD, laying in bed, chronically ill-appearing Eyes: No drainage noted Cardiovascular: tachycardia noted, anasarca present in all extremities  Respiratory: slightly increased work of breathing noted, not in respiratory distress, on [place] O2 Abdomen: obese Skin: no rashes or lesions on visible skin Neuro: Awake  Imaging:  I personally reviewed recent imaging.   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 Since admission, GI has been consulted for workup of acute lower GI bleed. Patient also noted to have severe sepsis secondary to gram-negative bacteremia for which she is currently receiving antibiotics and is on pressor support. Palliative medicine team consulted to assist with complex medical decision making.    Recommendations/Plan: # Complex medical decision making/goals of care:  - During extensive discussion with patient and family on 4/25, determined patient is a high surgical risk and not appropriate for further cancer directed therapies such as chemotherapy.  Goal is for patient to return to his long-term care facility with hospice support.  TOC assisting with coordination and plan is for patient to return with [facility]  hospice.   -Continue to provide support and reminders that hospice will also assist with his symptom management moving forward at the long-term care facility which patient is thankful for.  -  Code Status: Full Code     # Symptom management:  - Pain/Dyspnea   -Continue oxycodone 10mg  q4hrs prn   -Continue IV morphine for breakthrough, medication history noted.      -Insomnia   -Continue trazodone 25mg  qhs  Non-pharmacologic Delirium Precautions  - Frequent re-orientation; update board w/ date, names of treatment team  - Daytime stimulation: Open curtains, turn lights on, and turn on television as tolerated during waking hours  - Nighttime calm - close curtains, turn lights off, and turn off television at 9pm  with minimal interruptions from treatment team during sleeping hours, including avoiding lab draws if possible  - Encourage continued presence of family/friends  - Occupy w/ distractions - e.g. ask them to fold washcloths, busy-board  - Encourage movement with PT/OT as tolerated  - Ensure adequate sensorium, to include providing glasses, dentures, and hearing aids to patient as appropriate  - Assess and treat pain (incl nonverbal cues); e.g. consider scheduled tylenol  - Assess and treat constipation and urinary  retention  - Ensure hydration, electrolyte balance (K to 4.0, Mg to 2.0), adequate oxygenation  - Review medications (addition, deletion, changes can trigger)   # Discharge Planning: Return to long term care facility with [facility] hospice when medically  appropraite for discharge   Thank you for allowing the palliative care team to participate in the care [name]. Low MDM.  Rosalin Hawking MD Palliative Care Provider PMT # [phone]  This provider spent a total of 36 minutes providing patient's care.  Includes review of EMR, discussing care with other staff members involved in patient's medical care, obtaining relevant history and information from patient and/or patient's family, and personal review of imaging and lab work. Greater than 50% of the time was spent counseling and coordinating care related to the above assessment and plan.    *Please note that this is a verbal dictation therefore any spelling or grammatical errors are due to the "[employer]agon [employer] One" system interpretation.

## 2023-04-05 NOTE — Progress Notes (Signed)
Nutrition Follow-up  DOCUMENTATION CODES:   Not applicable  INTERVENTION:  - Soft diet.  - Ensure Plus High Protein po BID, each supplement provides 350 kcal and 20 grams of protein. - Magic cup BID with meals, each supplement provides 290 kcal and 9 grams of protein   NUTRITION DIAGNOSIS:   Inadequate oral intake related to poor appetite, nausea as evidenced by energy intake < 75% for > 7 days. *ongoing  GOAL:   Patient will meet greater than or equal to 90% of their needs *unmet  MONITOR:   PO intake, Supplement acceptance, Weight trends  REASON FOR ASSESSMENT:   Malnutrition Screening Tool    ASSESSMENT:   72 y.o. male with PMH HTN, COPD, chronic hypoxic respiratory failure on 2 L of oxygen , CKD stage IIIb, combined systolic/diastolic CHF who presented with complaint of dark blood in the stool multiple times, admitted for acute GI bleed.  Patient laying in bed at time of visit. Reports feeling very poorly today, as he has felt the entire admission.  Notes his appetite is also very poor and he is only taking a few bites at each meal. He is documented to be consuming mostly 5% of meals. Average of 13% over the past week. Has been drinking Ensure twice daily (except yesterday). Endorses enjoying the Ensure.  Encouraged patient to continue to try and eat something at each meal and consume Ensure. He is agreeable to also try Magic Cup in chocolate with lunch and dinner.  Patient notes he is very nauseous and feels he may vomit. Informed RN as per his request.  Given patient's ongoing poor oral intake, would typically recommend consideration of feeding tube/TF however palliative has been following patient since admission and due to his rectosigmoid adenocarcinoma having no surgical or medical interventions, plan is for patient to return to his long-term care facility with hospice.   Admit weight: 285# Current weight: 278#    Medications reviewed and include: Lasix  Labs  reviewed:  -   Diet Order:   Diet Order             DIET SOFT Fluid consistency: Thin  Diet effective now                   EDUCATION NEEDS:  Education needs have been addressed  Skin:  Skin Assessment: Reviewed RN Assessment Skin Integrity Issues:: Stage II Stage II: R buttocks  Last BM:  4/30  Height:  Ht Readings from Last 1 Encounters:  03/27/23 5\' 10"  (1.778 m)   Weight:  Wt Readings from Last 1 Encounters:  04/05/23 126.4 kg    BMI:  Body mass index is 39.98 kg/m.  Estimated Nutritional Needs:  Kcal:  2000-2200 kcals Protein:  100-110 grams Fluid:  >/= 2L    Shelle Iron RD, LDN For contact information, refer to [facility].

## 2023-04-06 DIAGNOSIS — K922 Gastrointestinal hemorrhage, unspecified: Secondary | ICD-10-CM | POA: Diagnosis not present

## 2023-04-06 LAB — BASIC METABOLIC PANEL
Anion gap: 12 (ref 5–15)
BUN: 39 mg/dL — ABNORMAL HIGH (ref 8–23)
CO2: 26 mmol/L (ref 22–32)
Calcium: 8.4 mg/dL — ABNORMAL LOW (ref 8.9–10.3)
Chloride: 100 mmol/L (ref 98–111)
Creatinine, Ser: 1.26 mg/dL — ABNORMAL HIGH (ref 0.61–1.24)
GFR, Estimated: >60 mL/min (ref >60)
Glucose, Bld: 174 mg/dL — ABNORMAL HIGH (ref 70–99)
Potassium: 3.1 mmol/L — ABNORMAL LOW (ref 3.5–5.1)
Sodium: 138 mmol/L (ref 135–145)

## 2023-04-06 LAB — CBC
HCT: 31.8 % — ABNORMAL LOW (ref 39.0–52.0)
Hemoglobin: 8.9 g/dL — ABNORMAL LOW (ref 13.0–17.0)
MCH: 24.5 pg — ABNORMAL LOW (ref 26.0–34.0)
MCHC: 28 g/dL — ABNORMAL LOW (ref 30.0–36.0)
MCV: 87.4 fL (ref 80.0–100.0)
Platelets: 399 10*3/uL (ref 150–400)
RBC: 3.64 MIL/uL — ABNORMAL LOW (ref 4.22–5.81)
RDW: 17 % — ABNORMAL HIGH (ref 11.5–15.5)
WBC: 11.7 10*3/uL — ABNORMAL HIGH (ref 4.0–10.5)
nRBC: 0 % (ref 0.0–0.2)

## 2023-04-06 MED ORDER — OXYCODONE HCL 10 MG PO TABS: 10.0000 mg | ORAL_TABLET | ORAL | 0 refills | Status: AC | PRN

## 2023-04-06 MED ORDER — POTASSIUM CHLORIDE CRYS ER 20 MEQ PO TBCR
40.0000 meq | EXTENDED_RELEASE_TABLET | Freq: Once | ORAL | Status: AC
  Administered 2023-04-06: 40 meq via ORAL
  Filled 2023-04-06: qty 2

## 2023-04-06 MED ORDER — FUROSEMIDE 40 MG PO TABS: 40.0000 mg | ORAL_TABLET | Freq: Two times a day (BID) | ORAL | Status: DC

## 2023-04-06 MED ORDER — FUROSEMIDE 40 MG PO TABS
60.0000 mg | ORAL_TABLET | Freq: Two times a day (BID) | ORAL | Status: DC
  Administered 2023-04-06 – 2023-04-07 (×2): 60 mg via ORAL
  Filled 2023-04-06 (×2): qty 1

## 2023-04-06 MED ORDER — POTASSIUM CHLORIDE CRYS ER 20 MEQ PO TBCR
40.0000 meq | EXTENDED_RELEASE_TABLET | Freq: Once | ORAL | Status: AC
  Administered 2023-04-06: 40 meq via ORAL

## 2023-04-06 NOTE — Progress Notes (Signed)
Daily Progress Note   Patient Name: Charles Mckinney       Date: 04/06/2023 DOB: Oct 30, 1951  Age: 72 y.o. MRN#: [id number] Attending Physician: Burnadette Pop, MD Primary Care Physician: Virl Cagey, MD Admit Date: 03/24/2023 Length of Stay: 12 days  Reason for Consultation/Follow-up: Establishing goals of care  Subjective:   CC: Patient is resting in bed, denies complaints but appears chronically ill.  States that he is feeling "so-so".  Following up regarding complex medical decision making and symptom management.  Subjective: Reviewed EMR prior to seeing patient.   Utilizing IV morphine as well as oral oxycodone.    Objective:   Vital Signs:  BP 105/68 (BP Location: Left Arm)   Pulse 82   Temp 98.5 F (36.9 C) (Oral)   Resp (!) 21   Ht 5\' 10"  (1.778 m)   Wt 129.1 kg   SpO2 95%   BMI 40.84 kg/m   Physical Exam: General: NAD, laying in bed, chronically ill-appearing Eyes: No drainage noted Cardiovascular: tachycardia noted, anasarca present in all extremities  Respiratory: slightly increased work of breathing noted, not in respiratory distress, on [place] O2 Abdomen: obese Skin: no rashes or lesions on visible skin Neuro: Awake  Imaging:  I personally reviewed recent imaging.   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 Since admission, GI has been consulted for workup of acute lower GI bleed. Patient also noted to have severe sepsis secondary to gram-negative bacteremia for which she is currently receiving antibiotics and is on pressor support. Palliative medicine team consulted to assist with complex medical decision  making.   Recommendations/Plan: # Complex medical decision making/goals of care:  - During extensive discussion with patient and family on 4/25, determined patient is a high surgical risk and not appropriate for further cancer directed therapies such as chemotherapy.  Goal is for patient to return to his long-term care facility with hospice support.  TOC assisting with coordination and plan is for patient to return with [facility]  hospice.  Discussed again with patient today about considering DNR/DNI.  Wishes to discuss with his son today.  Will follow-up on 04-07-2023.   -Continue to provide support and reminders that hospice will also assist with his symptom management moving forward at the long-term care facility which patient is thankful for.  -  Code Status: Full Code     # Symptom management:  - Pain/Dyspnea   -Continue oxycodone 10mg  q4hrs prn   -Continue IV morphine for breakthrough, medication history noted.      -Insomnia   -Continue trazodone 25mg  qhs  Non-pharmacologic Delirium Precautions  - Frequent re-orientation; update board w/ date, names of treatment team  - Daytime stimulation: Open curtains, turn lights on, and turn on television as tolerated during waking hours  - Nighttime calm - close curtains, turn lights off, and turn off television at 9pm  with minimal interruptions from treatment team during sleeping hours, including avoiding lab draws if possible  - Encourage continued presence of family/friends  - Occupy w/ distractions - e.g. ask them to fold washcloths, busy-board  - Encourage movement with PT/OT as tolerated  - Ensure adequate sensorium, to include providing glasses, dentures,  and hearing aids to patient as appropriate  - Assess and treat pain (incl nonverbal cues); e.g. consider scheduled tylenol  - Assess and treat constipation and urinary retention  - Ensure hydration, electrolyte balance (K to 4.0, Mg to 2.0), adequate oxygenation  - Review  medications (addition, deletion, changes can trigger)   # Discharge Planning: Return to long term care facility with [facility] hospice when medically appropraite for discharge   Thank you for allowing the palliative care team to participate in the care [name]. Mod  MDM.  Rosalin Hawking MD Palliative Care Provider PMT # [phone]   Mod MDM.  Includes review of EMR, discussing care with other staff members involved in patient's medical care, obtaining relevant history and information from patient and/or patient's family, and personal review of imaging and lab work. Greater than 50% of the time was spent counseling and coordinating care related to the above assessment and plan.    *Please note that this is a verbal dictation therefore any spelling or grammatical errors are due to the "Dragon [employer] One" system interpretation.

## 2023-04-06 NOTE — Progress Notes (Addendum)
PROGRESS NOTE  Charles Mckinney  WUJ:[id number] DOB: 06/07/1951 DOA: 03/24/2023 PCP: Virl Cagey, MD   Brief Narrative: Patient is a 72 year old male from [place] [facility] with history of hypertension, COPD, chronic hypoxic respiratory failure on 2 L of oxygen , CKD stage IIIb, atrial fibrillation on Eliquis, combined systolic/diastolic CHF, OSA, AAA who presents to the emergency department with complaint of dark blood in the stool multiple times.  He noticed dark red blood in diaper multiple times over the past week.  Takes Eliquis.  On presentation, lab work showed potassium of 2.2, hemoglobin 7.4, creatinine of 1.9 . He denies any nausea, abdomen pain or vomiting.  Blood pressure was soft on presentation.  Patient was admitted for the management of  GI bleed, GI consulted. Hospital course remarkable for persistent A-fib with RVR, hypokalemia, melanotic stools.  Colonoscopy showed large rectosigmoid partially obstructing mass ,likely malignant.  Cardiology,palliative care,  General surgery following, Oncology also consulted .  Extensive goals of care was discussed with family.  Current plan is for discharge back to [facility] with hospice,likely tomorrow if remains stable .  Assessment & Plan:  Acute lower GI bleed: Presented with hematochezia, dark stool.  Hemoglobin of 7.4 on presentation.  Given 2 units of blood transfusion.   Hemoglobin now stable in the range of 8-9 . On Protonix. EGD showed nonbleeding gastric ulcer with a clean ulcer base.  Colonoscopy showed large rectosigmoid partially obstructing mass .On soft diet  Rectosigmoid adenocarcinoma: Colonoscopy showed large rectosigmoid partially obstructing mass .biopsy positive for adenocarcinoma.  CT chest/abdomen/pelvis with contrast showed solitary right upper lobe 5 mm pulmonary nodule . Oncology, general surgery, GI were following.  He is not a candidate for surgical or medical intervention.  Plan is for hospice approach  Persistent  A-fib with RVR: On Eliquis at home and metoprolol for rate control.   Eliquis on hold.  Cardiology was following.  Currently on amiodarone.  Eliquis cannot be continued due to high risk for rebleeding  Dyspnea: Multifactorial secondary to COPD, CHF and also possibly obesity hypoventilation syndrome.  Continue Lasix, steroid which will be tapered slowly.  Gram-negative bacteremia: Had mild leukocytosis, became febrile on 4/21.  Urine culture/blood cultures showing E. coli.  Treated with ceftriaxone.     AKI in CKD stage IIIa: Baseline creatinine was 1.5 ,3 years ago . Presented with creatinine of 1.9.  Most likely prerenal in the setting of blood loss.  antihypertensives on hold.  Kidney function is stable  Acute on chronic Combined systolic/diastolic CHF: Echo done on 10/2018 showed EF of 30 to 35%, diffuse hypokinesis. Has chronic edema bilateral lower extremities.  Cardiology was consulted and following.  Switched to oral Lasix   Hypokalemia: Being aggressively supplemented and monitored.     OSA: On CPAP   COPD: Not in exacerbation.  Uses 2L of at home.   Elevated alkaline phosphatase: Elevated GGT. liver enzymes normal.  Right upper quadrant ultrasound showed hepatic steatosis  Left upper extremity edema: Negative venous Doppler   Debility/deconditioning/obesity: Morbidly obese patient.  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  Now found to have  colorectal cancer, permanent A-fib, E. coli bacteremia.  Ambulates with the help of wheelchair.  Lives at a SNF, [facility] consulted.  Very poor quality of life due to several comorbidities.  Still full code.  Palliaitve care were following.  Plan is to discharge to SNF with hospice  Nutrition Problem: Inadequate oral intake Etiology: poor appetite, nausea Pressure Injury 03/25/23 Buttocks  Right Stage 2 -  Partial thickness loss of dermis presenting as a shallow open injury with a red, pink wound bed without slough. (Active)  03/25/23  1522  Location: Buttocks  Location Orientation: Right  Staging: Stage 2 -  Partial thickness loss of dermis presenting as a shallow open injury with a red, pink wound bed without slough.  Wound Description (Comments):   Present on Admission: Yes  Dressing Type None 04/05/23 0800     Pressure Injury 03/25/23 Buttocks Right Stage 2 -  Partial thickness loss of dermis presenting as a shallow open injury with a red, pink wound bed without slough. (Active)  03/25/23 1523  Location: Buttocks  Location Orientation: Right  Staging: Stage 2 -  Partial thickness loss of dermis presenting as a shallow open injury with a red, pink wound bed without slough.  Wound Description (Comments):   Present on Admission: Yes  Dressing Type None 04/04/23 0816    DVT prophylaxis:SCDs Start: 03/25/23 0408     Code Status: Full Code  Family Communication:Called and discussed with son on phone today  Patient status: Inpatient  Patient is from : SNF  Anticipated discharge to: SNF  Estimated DC date: tomorrow  Consultants: GI, cardiology, palliative care, general surgery,oncology  Procedures: EGD/colonoscopy  Antimicrobials:  Anti-infectives (From admission, onward)    Start     Dose/Rate Route Frequency Ordered Stop   04/03/23 1000  cefTRIAXone (ROCEPHIN) 2 g in sodium chloride 0.9 % 100 mL IVPB        2 g 200 mL/hr over 30 Minutes Intravenous Every 24 hours 04/03/23 0914 04/05/23 1128   03/27/23 1000  cefTRIAXone (ROCEPHIN) 2 g in sodium chloride 0.9 % 100 mL IVPB        2 g 200 mL/hr over 30 Minutes Intravenous Every 24 hours 03/27/23 0737 04/02/23 1034   03/26/23 1600  cefTRIAXone (ROCEPHIN) 2 g in sodium chloride 0.9 % 100 mL IVPB  Status:  Discontinued        2 g 200 mL/hr over 30 Minutes Intravenous Every 24 hours 03/26/23 1510 03/27/23 0737   03/26/23 1600  azithromycin (ZITHROMAX) 500 mg in sodium chloride 0.9 % 250 mL IVPB  Status:  Discontinued        500 mg 250 mL/hr over 60 Minutes  Intravenous Every 24 hours 03/26/23 1510 03/27/23 0736       Subjective: Patient seen and examined at bedside today.  On 4 L of oxygen per minute.  Says that he cannot breathe ,but overall he appeared without any distress.  Objective: Vitals:   04/06/23 0510 04/06/23 0600 04/06/23 0843 04/06/23 1226  BP: (!) 141/92   105/68  Pulse: 98   82  Resp:    (!) 21  Temp: 97.8 F (36.6 C)   98.5 F (36.9 C)  TempSrc: Oral   Oral  SpO2: 94%  93% 95%  Weight:  129.1 kg    Height:        Intake/Output Summary (Last 24 hours) at 04/06/2023 1318 Last data filed at 04/06/2023 1232 Gross per 24 hour  Intake 1229.74 ml  Output 2025 ml  Net -795.26 ml   Filed Weights   04/04/23 0532 04/05/23 0500 04/06/23 0600  Weight: 127.9 kg 126.4 kg 129.1 kg    Examination:   General exam: Overall comfortable, not in distress, very deconditioned, morbidly obese, weak appearing HEENT: PERRL Respiratory system: Diminished air sounds bilaterally Cardiovascular system: Irregularly irregular rhythm.  Gastrointestinal system: Abdomen is obese, soft  and nontender. Central nervous system: Alert and mostly oriented Extremities: Trace bilateral lower extremity edema  skin: Sacral pressure ulcer  Data Reviewed: I have personally reviewed following labs and imaging studies  CBC: Recent Labs  Lab 03/31/23 0259 04/03/23 0933 04/04/23 0446 04/06/23 0442  WBC 10.0 11.7* 11.1* 11.7*  HGB 8.8* 9.3* 8.8* 8.9*  HCT 30.7* 32.7* 31.8* 31.8*  MCV 88.0 86.5 86.9 87.4  PLT 426* 486* 456* 399   Basic Metabolic Panel: Recent Labs  Lab 04/02/23 0734 04/03/23 0933 04/04/23 0446 04/05/23 0433 04/06/23 0442  NA 136 137 136 137 138  K 3.2* 4.1 3.6 4.0 3.1*  CL 101 103 100 102 100  CO2 21* 24 24 23 26   GLUCOSE 127* 152* 180* 168* 174*  BUN 19 23 29* 33* 39*  CREATININE 1.01 1.19 1.21 1.13 1.26*  CALCIUM 8.2* 8.6* 8.3* 8.5* 8.4*  MG 2.1  --   --   --   --   PHOS 3.9  --   --   --   --      No results  found for this or any previous visit (from the past 240 hour(s)).    Radiology Studies: No results found.  Scheduled Meds:  amiodarone  200 mg Oral Daily   Chlorhexidine Gluconate Cloth  6 each Topical Daily   feeding supplement  237 mL Oral BID BM   ipratropium-albuterol  3 mL Nebulization BID   levothyroxine  100 mcg Oral Q0600   metoprolol tartrate  25 mg Oral BID   mometasone-formoterol  2 puff Inhalation BID   pantoprazole (PROTONIX) IV  40 mg Intravenous Q12H   potassium chloride  40 mEq Oral Once   predniSONE  60 mg Oral Q breakfast   sodium chloride flush  3 mL Intravenous Q12H   traZODone  25 mg Oral QHS   Continuous Infusions:     LOS: 12 days   Burnadette Pop, MD [facility] P5/01/2023, 1:18 PM

## 2023-04-07 ENCOUNTER — Inpatient Hospital Stay (HOSPITAL_COMMUNITY): Payer: No Typology Code available for payment source

## 2023-04-07 DIAGNOSIS — K922 Gastrointestinal hemorrhage, unspecified: Secondary | ICD-10-CM | POA: Diagnosis not present

## 2023-04-07 LAB — CBC
HCT: 33.7 % — ABNORMAL LOW (ref 39.0–52.0)
Hemoglobin: 9.5 g/dL — ABNORMAL LOW (ref 13.0–17.0)
MCH: 24.4 pg — ABNORMAL LOW (ref 26.0–34.0)
MCHC: 28.2 g/dL — ABNORMAL LOW (ref 30.0–36.0)
MCV: 86.4 fL (ref 80.0–100.0)
Platelets: 396 10*3/uL (ref 150–400)
RBC: 3.9 MIL/uL — ABNORMAL LOW (ref 4.22–5.81)
RDW: 16.8 % — ABNORMAL HIGH (ref 11.5–15.5)
WBC: 15.3 10*3/uL — ABNORMAL HIGH (ref 4.0–10.5)
nRBC: 0 % (ref 0.0–0.2)

## 2023-04-07 LAB — BASIC METABOLIC PANEL
Anion gap: 12 (ref 5–15)
BUN: 46 mg/dL — ABNORMAL HIGH (ref 8–23)
CO2: 25 mmol/L (ref 22–32)
Calcium: 8.8 mg/dL — ABNORMAL LOW (ref 8.9–10.3)
Chloride: 102 mmol/L (ref 98–111)
Creatinine, Ser: 1.23 mg/dL (ref 0.61–1.24)
GFR, Estimated: >60 mL/min (ref >60)
Glucose, Bld: 185 mg/dL — ABNORMAL HIGH (ref 70–99)
Potassium: 3.8 mmol/L (ref 3.5–5.1)
Sodium: 139 mmol/L (ref 135–145)

## 2023-04-07 LAB — PROCALCITONIN: Procalcitonin: <0.1 ng/mL

## 2023-04-07 LAB — BRAIN NATRIURETIC PEPTIDE: B Natriuretic Peptide: 545.4 pg/mL — ABNORMAL HIGH (ref 0.0–100.0)

## 2023-04-07 MED ORDER — PREDNISONE 20 MG PO TABS: 20.0000 mg | ORAL_TABLET | Freq: Every day | ORAL | Status: DC

## 2023-04-07 MED ORDER — PREDNISONE 20 MG PO TABS
40.0000 mg | ORAL_TABLET | Freq: Every day | ORAL | Status: DC
  Administered 2023-04-08: 40 mg via ORAL
  Filled 2023-04-07: qty 2

## 2023-04-07 MED ORDER — ALBUMIN HUMAN 25 % IV SOLN
25.0000 g | INTRAVENOUS | Status: AC
  Administered 2023-04-07: 25 g via INTRAVENOUS
  Filled 2023-04-07: qty 100

## 2023-04-07 MED ORDER — FUROSEMIDE 10 MG/ML IJ SOLN
80.0000 mg | Freq: Two times a day (BID) | INTRAMUSCULAR | Status: DC
  Administered 2023-04-07 – 2023-04-08 (×3): 80 mg via INTRAVENOUS
  Filled 2023-04-07 (×3): qty 8

## 2023-04-07 MED ORDER — FUROSEMIDE 10 MG/ML IJ SOLN
20.0000 mg | Freq: Once | INTRAMUSCULAR | Status: AC
  Administered 2023-04-07: 20 mg via INTRAVENOUS
  Filled 2023-04-07: qty 2

## 2023-04-07 MED ORDER — PANTOPRAZOLE SODIUM 40 MG PO TBEC
40.0000 mg | DELAYED_RELEASE_TABLET | Freq: Two times a day (BID) | ORAL | Status: DC
  Administered 2023-04-07 – 2023-04-08 (×2): 40 mg via ORAL
  Filled 2023-04-07 (×2): qty 1

## 2023-04-07 MED ORDER — FUROSEMIDE 40 MG PO TABS: 80.0000 mg | ORAL_TABLET | Freq: Two times a day (BID) | ORAL | Status: DC

## 2023-04-07 NOTE — Progress Notes (Signed)
PROGRESS NOTE  Charles Mckinney  ZOX:[id number] DOB: 10/31/1951 DOA: 03/24/2023 PCP: Virl Cagey, MD   Brief Narrative: Patient is a 72 year old male from [place] [facility] with history of hypertension, COPD, chronic hypoxic respiratory failure on 2 L of oxygen , CKD stage IIIb, atrial fibrillation on Eliquis, combined systolic/diastolic CHF, OSA, AAA who presents to the emergency department with complaint of dark blood in the stool multiple times.  He noticed dark red blood in diaper multiple times over the past week.  Takes Eliquis.  On presentation, lab work showed potassium of 2.2, hemoglobin 7.4, creatinine of 1.9 . He denies any nausea, abdomen pain or vomiting.  Blood pressure was soft on presentation.  Patient was admitted for the management of  GI bleed, GI consulted. Hospital course remarkable for persistent A-fib with RVR, hypokalemia, melanotic stools.  Colonoscopy showed large rectosigmoid partially obstructing mass ,likely malignant.  Cardiology,palliative care,  General surgery following, Oncology also consulted .  Plan was for dc to [facility] SNF with hospice,but his respiratory status worsened last night and is currently on high flow oxygen, restarted IV Lasix.  Ongoing goals of care with family  Assessment & Plan:  Acute lower GI bleed: Presented with hematochezia, dark stool.  Hemoglobin of 7.4 on presentation.  Given 2 units of blood transfusion.   Hemoglobin now stable in the range of 8-9 . On Protonix. EGD showed nonbleeding gastric ulcer with a clean ulcer base.  Colonoscopy showed large rectosigmoid partially obstructing mass .On soft diet  Rectosigmoid adenocarcinoma: Colonoscopy showed large rectosigmoid partially obstructing mass .biopsy positive for adenocarcinoma.  CT chest/abdomen/pelvis with contrast showed solitary right upper lobe 5 mm pulmonary nodule . Oncology, general surgery, GI were following.  He is not a candidate for surgical or medical intervention.  Plan is  for hospice approach  Persistent A-fib with RVR: On Eliquis at home and metoprolol for rate control.   Eliquis on hold.  Cardiology was following.  Currently on amiodarone.  Eliquis cannot be continued due to high risk for rebleeding  Dyspnea: Multifactorial secondary to COPD, CHF and also possibly obesity hypoventilation syndrome.  On high flow oxygen this morning, continue IV Lasix  Gram-negative bacteremia: Had mild leukocytosis, became febrile on 4/21.  Urine culture/blood cultures showing E. coli.  Treated with ceftriaxone.     AKI in CKD stage IIIa: Baseline creatinine was 1.5 ,3 years ago . Presented with creatinine of 1.9.  Most likely prerenal in the setting of blood loss.  antihypertensives on hold.  Kidney function is stable  Acute on chronic Combined systolic/diastolic CHF: Echo done on 10/2018 showed EF of 30 to 35%, diffuse hypokinesis. Has chronic edema bilateral lower extremities.  Cardiology was consulted and was following.  Restarted iv lasix   Hypokalemia: Being aggressively supplemented and monitored.     OSA: On CPAP   COPD: Not in exacerbation.  Uses 2L of at home.   Elevated alkaline phosphatase: Elevated GGT. liver enzymes normal.  Right upper quadrant ultrasound showed hepatic steatosis  Left upper extremity edema: Negative venous Doppler   Debility/deconditioning/obesity: Morbidly obese patient.  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  Now found to have  colorectal cancer, permanent A-fib, E. coli bacteremia.  Ambulates with the help of wheelchair.  Lives at a SNF, [facility] consulted.  Very poor quality of life due to several comorbidities.  Still full code.  Palliaitve care  following.  Ongoing goals of care  Nutrition Problem: Inadequate oral intake Etiology: poor appetite, nausea  Pressure Injury 03/25/23 Buttocks Right Stage 2 -  Partial thickness loss of dermis presenting as a shallow open injury with a red, pink wound bed without slough. (Active)   03/25/23 1522  Location: Buttocks  Location Orientation: Right  Staging: Stage 2 -  Partial thickness loss of dermis presenting as a shallow open injury with a red, pink wound bed without slough.  Wound Description (Comments):   Present on Admission: Yes  Dressing Type Foam - Lift dressing to assess site every shift 04/07/23 0832     Pressure Injury 03/25/23 Buttocks Right Stage 2 -  Partial thickness loss of dermis presenting as a shallow open injury with a red, pink wound bed without slough. (Active)  03/25/23 1523  Location: Buttocks  Location Orientation: Right  Staging: Stage 2 -  Partial thickness loss of dermis presenting as a shallow open injury with a red, pink wound bed without slough.  Wound Description (Comments):   Present on Admission: Yes  Dressing Type None 04/04/23 0816    DVT prophylaxis:SCDs Start: 03/25/23 0408     Code Status: Full Code  Family Communication:Called and discussed with son on phone on 5/2  Patient status: Inpatient  Patient is from : SNF  Anticipated discharge to: not sure  Estimated DC date: not sure  Consultants: GI, cardiology, palliative care, general surgery,oncology  Procedures: EGD/colonoscopy  Antimicrobials:  Anti-infectives (From admission, onward)    Start     Dose/Rate Route Frequency Ordered Stop   04/03/23 1000  cefTRIAXone (ROCEPHIN) 2 g in sodium chloride 0.9 % 100 mL IVPB        2 g 200 mL/hr over 30 Minutes Intravenous Every 24 hours 04/03/23 0914 04/05/23 1128   03/27/23 1000  cefTRIAXone (ROCEPHIN) 2 g in sodium chloride 0.9 % 100 mL IVPB        2 g 200 mL/hr over 30 Minutes Intravenous Every 24 hours 03/27/23 0737 04/02/23 1034   03/26/23 1600  cefTRIAXone (ROCEPHIN) 2 g in sodium chloride 0.9 % 100 mL IVPB  Status:  Discontinued        2 g 200 mL/hr over 30 Minutes Intravenous Every 24 hours 03/26/23 1510 03/27/23 0737   03/26/23 1600  azithromycin (ZITHROMAX) 500 mg in sodium chloride 0.9 % 250 mL IVPB   Status:  Discontinued        500 mg 250 mL/hr over 60 Minutes Intravenous Every 24 hours 03/26/23 1510 03/27/23 0736       Subjective: Patient seen and examined at the bedside today.  When asked how he is doing, he is saying" so-so".  He became dyspneic last night and had to be bumped on oxygen, currently on 7 L of oxygen per minute with high flow  Objective: Vitals:   04/07/23 0509 04/07/23 0807 04/07/23 1059 04/07/23 1243  BP: 137/73  127/76 (!) 124/91  Pulse:   (!) 102 63  Resp: 18  (!) 30 (!) 24  Temp: 97.6 F (36.4 C)   97.7 F (36.5 C)  TempSrc: Oral   Oral  SpO2: 93% 91% 92% 94%  Weight:      Height:        Intake/Output Summary (Last 24 hours) at 04/07/2023 1252 Last data filed at 04/07/2023 1240 Gross per 24 hour  Intake 2497 ml  Output 1400 ml  Net 1097 ml   Filed Weights   04/05/23 0500 04/06/23 0600 04/07/23 0500  Weight: 126.4 kg 129.1 kg 125.6 kg    Examination:  General exam: Very  deconditioned, weak, lying in bed, morbidly obese HEENT: PERRL Respiratory system: Diminished sounds bilaterally Cardiovascular system: S1 & S2 heard, RRR.  Gastrointestinal system: Abdomen is obese , soft and nontender. Central nervous system: Alert and mostly oriented Extremities: trace bilateral lower extremity edema, no clubbing ,no cyanosis Skin: sacral pressure ulcer    Data Reviewed: I have personally reviewed following labs and imaging studies  CBC: Recent Labs  Lab 04/03/23 0933 04/04/23 0446 04/06/23 0442 04/07/23 0506  WBC 11.7* 11.1* 11.7* 15.3*  HGB 9.3* 8.8* 8.9* 9.5*  HCT 32.7* 31.8* 31.8* 33.7*  MCV 86.5 86.9 87.4 86.4  PLT 486* 456* 399 396   Basic Metabolic Panel: Recent Labs  Lab 04/02/23 0734 04/03/23 0933 04/04/23 0446 04/05/23 0433 04/06/23 0442 04/07/23 0404  NA 136 137 136 137 138 139  K 3.2* 4.1 3.6 4.0 3.1* 3.8  CL 101 103 100 102 100 102  CO2 21* 24 24 23 26 25   GLUCOSE 127* 152* 180* 168* 174* 185*  BUN 19 23 29* 33* 39* 46*   CREATININE 1.01 1.19 1.21 1.13 1.26* 1.23  CALCIUM 8.2* 8.6* 8.3* 8.5* 8.4* 8.8*  MG 2.1  --   --   --   --   --   PHOS 3.9  --   --   --   --   --      No results found for this or any previous visit (from the past 240 hour(s)).    Radiology Studies: DG Chest Port 1 View  Result Date: 04/07/2023 CLINICAL DATA:  Shortness of breath EXAM: PORTABLE CHEST 1 VIEW COMPARISON:  04/02/2023, 03/26/2023 FINDINGS: Post sternotomy changes. Left atrial appendage clip. Incompletely included left lung base. Cardiomegaly with left effusion and airspace disease at left base. Vascular congestion IMPRESSION: Incompletely included left lung base. Cardiomegaly with vascular congestion left effusion and airspace disease at the left base which may be due to atelectasis or pneumonia. Findings do not appear significantly changed. Electronically Signed   By: Jasmine Pang M.D.   On: 04/07/2023 02:08    Scheduled Meds:  amiodarone  200 mg Oral Daily   Chlorhexidine Gluconate Cloth  6 each Topical Daily   feeding supplement  237 mL Oral BID BM   furosemide  80 mg Intravenous Q12H   ipratropium-albuterol  3 mL Nebulization BID   levothyroxine  100 mcg Oral Q0600   metoprolol tartrate  25 mg Oral BID   mometasone-formoterol  2 puff Inhalation BID   pantoprazole (PROTONIX) IV  40 mg Intravenous Q12H   predniSONE  60 mg Oral Q breakfast   sodium chloride flush  3 mL Intravenous Q12H   traZODone  25 mg Oral QHS   Continuous Infusions:     LOS: 13 days   Burnadette Pop, MD [facility] P5/02/2023, 12:52 PM

## 2023-04-07 NOTE — Progress Notes (Signed)
[employer] engineer, contracting [facility]) Hospital Liaison Note:  Update on this Home with Hospice Referral:  Visited patient at bedside to support but patient was receiving evening ADL care and declined to speak with me at this time.    Reached out to patient's son Minerva Areola to support emotionally via telephone. Eric plans to speak with patient tonight about patient's disposition plan after discussions with PMT today.  Made Minerva Areola aware that [facility] will continue to follow daily as GOC and discharge plans evolve. Patient family has [facility] contact information and was encouraged to call for support or hospice related concerns or questions about hospice services upon discharge.  [facility] will continue to follow peripherally and support with hospice related issues as needed.    Roda Shutters, RN [facility] (on [place])  [phone]

## 2023-04-07 NOTE — Progress Notes (Signed)
Daily Progress Note   Patient Name: Charles Mckinney       Date: 04/07/2023 DOB: [birth year]  Age: 72 y.o. MRN#: [id number] Attending Physician: Burnadette Pop, MD Primary Care Physician: Virl Cagey, MD Admit Date: 03/24/2023 Length of Stay: 13 days  Reason for Consultation/Follow-up: Establishing goals of care  Subjective:   CC: Patient is resting in bed, denies complaints but appears chronically ill.  States that he is feeling "so-so".  Following up regarding complex medical decision making and symptom management.  Subjective: Reviewed EMR prior to seeing patient.   Utilizing IV morphine as well as oral oxycodone.    Objective:   Vital Signs:  BP 127/76   Pulse (!) 102   Temp 97.6 F (36.4 C) (Oral)   Resp (!) 30   Ht 5\' 10"  (1.778 m)   Wt 125.6 kg   SpO2 92%   BMI 39.73 kg/m   Physical Exam: General: NAD, laying in bed, chronically ill-appearing Eyes: No drainage noted Cardiovascular: tachycardia noted, anasarca present in all extremities  Respiratory: slightly increased work of breathing noted, not in respiratory distress, on [place] O2 Abdomen: obese Skin: no rashes or lesions on visible skin Neuro: Awake  Imaging:  I personally reviewed recent imaging.   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 Since admission, GI has been consulted for workup of acute lower GI bleed. Patient also noted to have severe sepsis secondary to gram-negative bacteremia for which she is currently receiving antibiotics and is on pressor support. Palliative medicine team consulted to assist with complex medical decision making.    Recommendations/Plan: # Complex medical decision making/goals of care:  - Patient remains extremely deconditioned, with escalating symptom burden, ongoing increased O2 requirements and is steadily requiring PRN IV opioids for symptom management of dyspnea. Hence, I discussed again with patient, and then call placed and discussed with son Minerva Areola as well -   Palliative recommendations are for DNR DNI, comfort measures and consideration for residential hospice, rather than [facility] with hospice.     -  Code Status: Full Code    Ongoing discussions about medical recommendation for DNR DNI.  # Symptom management:  - Pain/Dyspnea   -Continue oxycodone 10mg  q4hrs prn   -Continue IV morphine for breakthrough, medication history noted.      -Insomnia   -Continue trazodone 25mg  qhs  Non-pharmacologic Delirium Precautions  - Frequent re-orientation; update board w/ date, names of treatment team  - Daytime stimulation: Open curtains, turn lights on, and turn on television as tolerated during waking hours  - Nighttime calm - close curtains, turn lights off, and turn off television at 9pm  with minimal interruptions from treatment team during sleeping hours, including avoiding lab draws if possible  - Encourage continued presence of family/friends  - Occupy w/ distractions - e.g. ask them to fold washcloths, busy-board  - Encourage movement with PT/OT as tolerated  - Ensure adequate sensorium, to include providing glasses, dentures, and hearing aids to patient as appropriate  - Assess and treat pain (incl nonverbal cues); e.g. consider scheduled tylenol  - Assess and treat constipation and urinary retention  - Ensure hydration, electrolyte balance (K to 4.0, Mg  to 2.0), adequate oxygenation  - Review medications (addition, deletion, changes can trigger)   # Discharge Planning: Return to long term care facility with [facility] hospice when medically appropraite for discharge   Thank you for allowing  the palliative care team to participate in the care [name]. Mod  MDM.  Rosalin Hawking MD Palliative Care Provider PMT # [phone]   Mod MDM.  Includes review of EMR, discussing care with other staff members involved in patient's medical care, obtaining relevant history and information from patient and/or patient's family, and personal review of imaging and lab work. Greater than 50% of the time was spent counseling and coordinating care related to the above assessment and plan.    *Please note that this is a verbal dictation therefore any spelling or grammatical errors are due to the "[employer] One" system interpretation.

## 2023-04-07 NOTE — Progress Notes (Addendum)
    Patient Name: Charles Mckinney           DOB: 07-Dec-1950  MRN: [id number]      Admission Date: 03/24/2023  Attending Provider: Burnadette Pop, MD  Primary Diagnosis: GI bleeding   Level of care: Med-Surg    CROSS COVER NOTE   Date of Service   04/07/2023   Charles Mckinney, 72 y.o. male, was admitted on 03/24/2023 for GI bleeding.    HPI/Events of Note   RN reports worsening SOB, now requiring 6 L HFNC. Crackles auscultated at lung bases. Patient has been afebrile, WBC 11.7, hemodynamically stable, not in distress. Per documentation, patient has been dyspneic on this admission.  Dyspnea likely multifactorial and secondary to COPD and CHF.  Patient endorses some improvement after neb treatment and increase in O2 supply. Chest x-ray ordered--> interpreted as vascular congestion with left effusion and airspace disease at left base.  Atelectasis vs pneumonia? Additional orders placed for IV Lasix, IV albumin, CBC, procalcitonin, incentive spirometry   Interventions/ Plan   Above        Anthoney Harada, DNP, [facility]

## 2023-04-07 NOTE — Progress Notes (Signed)
Pt refusing cpap for the night. ?

## 2023-04-08 DIAGNOSIS — K922 Gastrointestinal hemorrhage, unspecified: Secondary | ICD-10-CM | POA: Diagnosis not present

## 2023-04-08 LAB — CBC
HCT: 31.7 % — ABNORMAL LOW (ref 39.0–52.0)
Hemoglobin: 9 g/dL — ABNORMAL LOW (ref 13.0–17.0)
MCH: 24 pg — ABNORMAL LOW (ref 26.0–34.0)
MCHC: 28.4 g/dL — ABNORMAL LOW (ref 30.0–36.0)
MCV: 84.5 fL (ref 80.0–100.0)
Platelets: 356 10*3/uL (ref 150–400)
RBC: 3.75 MIL/uL — ABNORMAL LOW (ref 4.22–5.81)
RDW: 16.9 % — ABNORMAL HIGH (ref 11.5–15.5)
WBC: 15.5 10*3/uL — ABNORMAL HIGH (ref 4.0–10.5)
nRBC: 0 % (ref 0.0–0.2)

## 2023-04-08 LAB — BASIC METABOLIC PANEL
Anion gap: 10 (ref 5–15)
BUN: 40 mg/dL — ABNORMAL HIGH (ref 8–23)
CO2: 29 mmol/L (ref 22–32)
Calcium: 8.6 mg/dL — ABNORMAL LOW (ref 8.9–10.3)
Chloride: 97 mmol/L — ABNORMAL LOW (ref 98–111)
Creatinine, Ser: 1.06 mg/dL (ref 0.61–1.24)
GFR, Estimated: >60 mL/min (ref >60)
Glucose, Bld: 128 mg/dL — ABNORMAL HIGH (ref 70–99)
Potassium: 2.5 mmol/L — CL (ref 3.5–5.1)
Sodium: 136 mmol/L (ref 135–145)

## 2023-04-08 MED ORDER — MORPHINE 100MG IN NS 100ML (1MG/ML) PREMIX INFUSION
2.0000 mg/h | INTRAVENOUS | Status: DC
  Administered 2023-04-08: 2 mg/h via INTRAVENOUS
  Filled 2023-04-08: qty 100

## 2023-04-08 MED ORDER — POTASSIUM CHLORIDE 10 MEQ/100ML IV SOLN
10.0000 meq | INTRAVENOUS | Status: DC
  Administered 2023-04-08 (×2): 10 meq via INTRAVENOUS
  Filled 2023-04-08 (×2): qty 100

## 2023-04-08 MED ORDER — POTASSIUM CHLORIDE 20 MEQ PO PACK
40.0000 meq | PACK | Freq: Once | ORAL | Status: DC
  Filled 2023-04-08: qty 2

## 2023-04-08 MED ORDER — FUROSEMIDE 40 MG PO TABS: 80.0000 mg | ORAL_TABLET | Freq: Two times a day (BID) | ORAL | Status: DC

## 2023-04-08 MED ORDER — LORAZEPAM 0.5 MG PO TABS
0.5000 mg | ORAL_TABLET | Freq: Four times a day (QID) | ORAL | Status: DC | PRN
  Administered 2023-04-08: 0.5 mg via ORAL
  Filled 2023-04-08: qty 1

## 2023-04-08 MED ORDER — MORPHINE BOLUS VIA INFUSION: 1.0000 mg | INTRAVENOUS | Status: DC | PRN

## 2023-04-08 NOTE — Progress Notes (Addendum)
Civil engineer, contracting [facility] Liaison Note   Received request from Transitions of Care Manager, Cyd Silence, for family interest in [facility]. Spoke with patient's son, Charles Mckinney, to confirm interest and explain services.   Patient approved to go to [facility].  Patient will transfer over via EMS today.    Nurse may call report at any time to [phone].  Please send signed DNR along with patient and leave IV intact.      Please do not hesitate to call with any hospice related questions.    Thank you for the opportunity to participate in this patient's care.   Redge Gainer, [facility] Liaison [phone]

## 2023-04-08 NOTE — Progress Notes (Signed)
Daily Progress Note   Patient Name: Charles Mckinney       Date: 04/08/2023 DOB: 1951/04/21  Age: 72 y.o. MRN#: [id number] Attending Physician: Burnadette Pop, MD Primary Care Physician: Virl Cagey, MD Admit Date: 03/24/2023 Length of Stay: 14 days  Reason for Consultation/Follow-up: Establishing goals of care  Subjective:   CC: Patient  is feeling anxious and short of breath.  Following up regarding complex medical decision making and symptom management.  Subjective: Reviewed EMR prior to seeing patient.   Utilizing IV morphine as well as oral oxycodone.    Objective:   Vital Signs:  BP 128/82 (BP Location: Left Arm)   Pulse 85   Temp (!) 97.5 F (36.4 C) (Oral)   Resp 18   Ht 5\' 10"  (1.778 m)   Wt 127.1 kg   SpO2 97%   BMI 40.21 kg/m   Physical Exam: General: NAD, laying in bed, chronically ill-appearing Eyes: No drainage noted Cardiovascular: tachycardia noted, anasarca present in all extremities  Respiratory:  increased work of breathing noted, not in respiratory distress, on [place] O2 Abdomen: obese Skin: no rashes or lesions on visible skin Neuro: Awake  Imaging:  I personally reviewed recent imaging.   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 Since admission, GI has been consulted for workup of acute lower GI bleed. Patient also noted to have severe sepsis secondary to gram-negative bacteremia for which she is currently receiving antibiotics and is on pressor support. Palliative medicine team consulted to assist with complex medical decision making.   Recommendations/Plan: # Complex medical decision  making/goals of care:  - Patient remains extremely deconditioned, with escalating symptom burden, ongoing increased O2 requirements and is steadily requiring PRN IV opioids for symptom management of dyspnea. Hence, I discussed again with patient, and then call placed and discussed with son Charles Mckinney as well -   Palliative recommendations are for DNR DNI, comfort measures and consideration for residential hospice, rather than [facility] with hospice.     -  Code Status: DNR   Now DNR DNI # Symptom management:  - Pain/Dyspnea   -Continue oxycodone 10mg  q4hrs prn   Start Morphine infusion - continuous and bolus infusion.       -Insomnia   -Continue trazodone 25mg  qhs  Non-pharmacologic Delirium Precautions  - Frequent re-orientation; update board w/ date, names of treatment team  - Daytime stimulation: Open curtains, turn lights on, and turn on television as tolerated during waking hours  - Nighttime calm - close curtains, turn lights off, and turn off television at 9pm  with minimal interruptions from treatment team during sleeping hours, including avoiding lab draws if possible  - Encourage continued presence of family/friends  - Occupy w/ distractions - e.g. ask them to fold washcloths, busy-board  - Encourage movement with PT/OT as tolerated  - Ensure adequate sensorium, to include providing glasses, dentures, and hearing aids to patient as appropriate  - Assess and treat pain (incl nonverbal cues); e.g. consider scheduled tylenol  - Assess and treat constipation and urinary retention  - Ensure hydration, electrolyte balance (K to 4.0, Mg to 2.0), adequate oxygenation  - Review medications (addition, deletion, changes can trigger)   #  Discharge Planning: Return to long term care facility with [facility] hospice when medically appropraite for discharge   Thank you for allowing the palliative care team to participate in the care Charles Mckinney. High  MDM.  Rosalin Hawking MD Palliative Care  Provider PMT # [phone]   Mod MDM.  Includes review of EMR, discussing care with other staff members involved in patient's medical care, obtaining relevant history and information from patient and/or patient's family, and personal review of imaging and lab work. Greater than 50% of the time was spent counseling and coordinating care related to the above assessment and plan.    *Please note that this is a verbal dictation therefore any spelling or grammatical errors are due to the "Dragon Medical One" system interpretation.

## 2023-04-08 NOTE — Discharge Summary (Signed)
Physician Discharge Summary  NYKOLAS ANDRADE WUJ:[id number] DOB: [birth year] DOA: 03/24/2023  PCP: Virl Cagey, MD  Admit date: 03/24/2023 Discharge date: 04/08/2023  Admitted From: Home Disposition:  Residential hospice  Discharge Condition:Stable CODE STATUS: Comfort Care Diet recommendation: Regular  Brief/Interim Summary: Patient is a 72 year old male from [facility] with history of hypertension, COPD, chronic hypoxic respiratory failure on 2 L of oxygen , CKD stage IIIb, atrial fibrillation on Eliquis, combined systolic/diastolic CHF, OSA, AAA who presents to the emergency department with complaint of dark blood in the stool multiple times.  He noticed dark red blood in diaper multiple times over the past week.  Takes Eliquis.  On presentation, lab work showed potassium of 2.2, hemoglobin 7.4, creatinine of 1.9 . He denies any nausea, abdomen pain or vomiting.  Blood pressure was soft on presentation.  Patient was admitted for the management of  GI bleed, GI consulted. Hospital course remarkable for persistent A-fib with RVR, hypokalemia, melanotic stools.  Colonoscopy showed large rectosigmoid partially obstructing mass ,likely malignant.  Cardiology,palliative care,  General surgery following, Oncology also consulted .  Plan was for dc to [facility] with hospice,but his respiratory status worsened last night and is currently on high flow oxygen, restarted IV Lasix.  After extensive discussion with family about goals of care, family/patient have expressed their desire for comfort care.  Plan for discharge to residential hospice    Following problems were addressed during the hospitalization: Acute lower GI bleed: Presented with hematochezia, dark stool.  Hemoglobin of 7.4 on presentation.  Given 2 units of blood transfusion.   Hemoglobin now stable in the range of 8-9 . On Protonix. EGD showed nonbleeding gastric ulcer with a clean ulcer base.  Colonoscopy showed large rectosigmoid  partially obstructing mass .   Rectosigmoid adenocarcinoma: Colonoscopy showed large rectosigmoid partially obstructing mass .biopsy positive for adenocarcinoma.  CT chest/abdomen/pelvis with contrast showed solitary right upper lobe 5 mm pulmonary nodule . Oncology, general surgery, GI were following.  He is not a candidate for surgical or medical intervention.  Plan is for hospice approach   Persistent A-fib with RVR: On Eliquis at home and metoprolol for rate control.   Eliquis on hold.  Cardiology was following.  Currently on amiodarone.  Eliquis cannot be continued due to high risk for rebleeding   Dyspnea: Multifactorial secondary to COPD, CHF and also possibly obesity hypoventilation syndrome.  Continue Lasix oral until patient transitioned to full comfort care   Gram-negative bacteremia: Had mild leukocytosis, became febrile on 4/21.  Urine culture/blood cultures showing E. coli.  Treated with ceftriaxone.     AKI in CKD stage IIIa: Baseline creatinine was 1.5 ,3 years ago . Presented with creatinine of 1.9.  Most likely prerenal in the setting of blood loss.  antihypertensives on hold.  Kidney function is stable   Acute on chronic Combined systolic/diastolic CHF: Echo done on 10/2018 showed EF of 30 to 35%, diffuse hypokinesis. Has chronic edema bilateral lower extremities.  Cardiology was consulted and was following.  On oral Lasix   Hypokalemia: Being aggressively supplemented and monitored.  Will stop doing blood work ,as he has been transitioned to full comfort care   OSA: On CPAP   COPD: On tapering steroids.  Uses 2L of at home.   Elevated alkaline phosphatase: Elevated GGT. liver enzymes normal.  Right upper quadrant ultrasound showed hepatic steatosis   Left upper extremity edema: Negative venous Doppler   Debility/deconditioning/obesity: Morbidly obese patient.  Cannot walk due to  severe arthritis of bilateral knee.  Now found to have  colorectal cancer, permanent A-fib,  E. coli bacteremia.  Ambulates with the help of wheelchair.  Lives at a SNF, [facility] consulted.  Very poor quality of life due to several comorbidities.  Perative care was following.  CODE STATUS changed to DNR.  Plan for transfer to [facility] when possible  Discharge Diagnoses:  Principal Problem:   GI bleeding Active Problems:   Coronary artery disease involving native coronary artery of native heart with angina pectoris (HCC)   Persistent atrial fibrillation (HCC): CHA2DS2Vasc 6   Chronic combined systolic and diastolic CHF, NYHA class 3 (HCC)   Hypokalemia   OSA (obstructive sleep apnea)   Symptomatic anemia   Acute renal failure superimposed on stage 3a chronic kidney disease (HCC)   Elevated alkaline phosphatase level   Palliative care by specialist   Bacteremia   Hypotension   Goals of care, counseling/discussion   Delirium   Counseling and coordination of care   Need for emotional support   Acute GI bleeding   Permanent atrial fibrillation (HCC)   Chronic combined systolic and diastolic heart failure (HCC)   Preoperative cardiovascular examination   Malignant neoplasm of colon (HCC)   Sleep difficulties   Shortness of breath   High risk medication use   Acute combined systolic and diastolic congestive heart failure (HCC)   General weakness   Pressure injury of skin    Discharge Instructions  Discharge Instructions     No wound care   Complete by: As directed       Allergies as of 04/08/2023       Reactions   Niacin And Related Other (See Comments)   FLUSHING   Xarelto [rivaroxaban] Other (See Comments)   Cause bloody stools. Switched to eliquis low dose due to bleeding.    Penicillins Itching, Rash, Other (See Comments)   Has patient had a PCN reaction causing immediate rash, facial/tongue/throat swelling, SOB or lightheadedness with hypotension: Yes Has patient had a PCN reaction causing severe rash involving mucus membranes or skin necrosis: No Has patient  had a PCN reaction that required hospitalization: No Has patient had a PCN reaction occurring within the last 10 years: No If all of the above answers are "NO", then may proceed with Cephalosporin use.        Medication List     STOP taking these medications    acetaminophen 325 MG tablet Commonly known as: TYLENOL   acetaminophen 500 MG tablet Commonly known as: TYLENOL   Acidophilus 100 MG Caps   albuterol (2.5 MG/3ML) 0.083% nebulizer solution Commonly known as: PROVENTIL   albuterol 108 (90 Base) MCG/ACT inhaler Commonly known as: ProAir HFA   allopurinol 300 MG tablet Commonly known as: ZYLOPRIM   apixaban 5 MG Tabs tablet Commonly known as: ELIQUIS   ARTIFICIAL TEARS OP   Aspercreme Lidocaine 4 % Generic drug: lidocaine   Aspercreme Original 10 % cream Generic drug: trolamine salicylate   Biofreeze 4 % Gel Generic drug: Menthol (Topical Analgesic)   bumetanide 2 MG tablet Commonly known as: BUMEX   CEPACOL MT   Dermacloud Oint   Desitin 40 % Pste Generic drug: Zinc Oxide   ferrous sulfate 325 (65 FE) MG tablet   fluticasone 50 MCG/ACT nasal spray Commonly known as: FLONASE   gabapentin 300 MG capsule Commonly known as: NEURONTIN   hydrocortisone 1 % lotion   levothyroxine 50 MCG tablet Commonly known as: SYNTHROID   loperamide  2 MG tablet Commonly known as: IMODIUM A-D   metolazone 2.5 MG tablet Commonly known as: ZAROXOLYN   metoprolol succinate 25 MG 24 hr tablet Commonly known as: TOPROL-XL   Minerin Creme Crea   nitroGLYCERIN 0.4 MG SL tablet Commonly known as: NITROSTAT   OXYGEN   pantoprazole 40 MG tablet Commonly known as: PROTONIX   potassium chloride SA 20 MEQ tablet Commonly known as: KLOR-CON M   pravastatin 40 MG tablet Commonly known as: PRAVACHOL   Sertraline HCl 200 MG Caps   Spiriva HandiHaler 18 MCG inhalation capsule Generic drug: tiotropium   sucralfate 1 g tablet Commonly known as: CARAFATE    traMADol 50 MG tablet Commonly known as: ULTRAM   traZODone 50 MG tablet Commonly known as: DESYREL   Vitamin D3 125 MCG (5000 UT) Caps   Wixela Inhub 250-50 MCG/ACT Aepb Generic drug: fluticasone-salmeterol   Zofran 4 MG tablet Generic drug: ondansetron       TAKE these medications    Oxycodone HCl 10 MG Tabs Take 1 tablet (10 mg total) by mouth every 4 (four) hours as needed for moderate pain or severe pain (dyspnea).        Contact information for follow-up providers     [facility] Follow up.   Specialty: Hospice and Palliative Medicine Why: A representative from [facility] will contact you regarding hospice services. Contact information: [address] [place] [address] [phone]             Contact information for after-discharge care     Destination     [facility] Preferred SNF .   Service: Skilled Nursing Contact information: [address] [place] [address] [phone]                    Allergies  Allergen Reactions   Niacin And Related Other (See Comments)    FLUSHING   Xarelto [Rivaroxaban] Other (See Comments)    Cause bloody stools. Switched to eliquis low dose due to bleeding.    Penicillins Itching, Rash and Other (See Comments)    Has patient had a PCN reaction causing immediate rash, facial/tongue/throat swelling, SOB or lightheadedness with hypotension: Yes Has patient had a PCN reaction causing severe rash involving mucus membranes or skin necrosis: No Has patient had a PCN reaction that required hospitalization: No Has patient had a PCN reaction occurring within the last 10 years: No If all of the above answers are "NO", then may proceed with Cephalosporin use.      Procedures/Studies: DG Chest Port 1 View  Result Date: 04/07/2023 CLINICAL DATA:  Shortness of breath EXAM: PORTABLE CHEST 1 VIEW COMPARISON:  04/02/2023, 03/26/2023  FINDINGS: Post sternotomy changes. Left atrial appendage clip. Incompletely included left lung base. Cardiomegaly with left effusion and airspace disease at left base. Vascular congestion IMPRESSION: Incompletely included left lung base. Cardiomegaly with vascular congestion left effusion and airspace disease at the left base which may be due to atelectasis or pneumonia. Findings do not appear significantly changed. Electronically Signed   By: Jasmine Pang M.D.   On: 04/07/2023 02:08   DG CHEST PORT 1 VIEW  Result Date: 04/02/2023 CLINICAL DATA:  Dyspnea. EXAM: PORTABLE CHEST 1 VIEW COMPARISON:  03/26/2023 FINDINGS: The cardio pericardial silhouette is enlarged. There is pulmonary vascular congestion without overt pulmonary edema. Left base collapse/consolidation with effusion is similar to prior. Telemetry leads overlie the chest. IMPRESSION: 1. No substantial interval change. 2. Left base collapse/consolidation with effusion.  Electronically Signed   By: Kennith Center M.D.   On: 04/02/2023 09:03   VAS [place] UPPER EXTREMITY VENOUS DUPLEX  Result Date: 03/31/2023 UPPER VENOUS STUDY  Patient Name:  CRISOFORO RADWAN  Date of Exam:   03/30/2023 Medical Rec #: [id number]         Accession #:    [id number] Date of Birth: Oct 31, 1951          Patient Gender: M Patient Age:   86 years Exam Location:  [facility] Procedure:      VAS [place] UPPER EXTREMITY VENOUS DUPLEX Referring Phys: Sadik Piascik --------------------------------------------------------------------------------  Indications: R.O DVT Risk Factors: None identified. Limitations: Poor ultrasound/tissue interface, bandages and line. Comparison Study: No prior stuides. Performing Technologist: Chanda Busing RVT  Examination Guidelines: A complete evaluation includes B-mode imaging, spectral Doppler, color Doppler, and power Doppler as needed of all accessible portions of each vessel. Bilateral testing is considered an integral part of a complete  examination. Limited examinations for reoccurring indications may be performed as noted.  Right Findings: +----------+------------+---------+-----------+----------+-------+ RIGHT     CompressiblePhasicitySpontaneousPropertiesSummary +----------+------------+---------+-----------+----------+-------+ Subclavian    Full       Yes       Yes                      +----------+------------+---------+-----------+----------+-------+  Left Findings: +----------+------------+---------+-----------+----------+-------+ LEFT      CompressiblePhasicitySpontaneousPropertiesSummary +----------+------------+---------+-----------+----------+-------+ IJV           Full       Yes       Yes                      +----------+------------+---------+-----------+----------+-------+ Subclavian    Full       Yes       Yes                      +----------+------------+---------+-----------+----------+-------+ Axillary      Full       Yes       Yes                      +----------+------------+---------+-----------+----------+-------+ Brachial      Full                                          +----------+------------+---------+-----------+----------+-------+ Radial        Full                                          +----------+------------+---------+-----------+----------+-------+ Ulnar         Full                                          +----------+------------+---------+-----------+----------+-------+ Cephalic      Full                                          +----------+------------+---------+-----------+----------+-------+ Basilic       Full                                          +----------+------------+---------+-----------+----------+-------+  Summary:  Right: No evidence of thrombosis in the subclavian.  Left: No evidence of deep vein thrombosis in the upper extremity. Findings consistent with acute superficial vein thrombosis involving the left cephalic  vein.  *See table(s) above for measurements and observations.  Diagnosing physician: Gerarda Fraction Electronically signed by Gerarda Fraction on 03/31/2023 at 4:33:33 PM.    Final    CT CHEST ABDOMEN PELVIS W CONTRAST  Result Date: 03/29/2023 CLINICAL DATA:  Rectosigmoid mass found on colonoscopy suspicious for adenocarcinoma, staging evaluation EXAM: CT CHEST, ABDOMEN, AND PELVIS WITH CONTRAST TECHNIQUE: Multidetector CT imaging of the chest, abdomen and pelvis was performed following the standard protocol during bolus administration of intravenous contrast. RADIATION DOSE REDUCTION: This exam was performed according to the departmental dose-optimization program which includes automated exposure control, adjustment of the mA and/or kV according to patient size and/or use of iterative reconstruction technique. CONTRAST:  OMNIPAQUE IOHEXOL 300 MG/ML  SOLN COMPARISON:  03/26/2023, 03/27/2023, 09/21/2015 FINDINGS: CT CHEST FINDINGS Cardiovascular: The heart is enlarged without pericardial effusion. No evidence of thoracic aortic aneurysm or dissection. Atherosclerosis of the aorta and native coronary vessels. 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 Mediastinum/Nodes: No enlarged mediastinal, hilar, or axillary lymph nodes. Thyroid gland, trachea, and esophagus demonstrate no significant findings. Lungs/Pleura: There are small bilateral pleural effusions, left greater than right. There is dense bilateral lower lobe consolidation, left greater than right, likely representing a combination of airspace disease and atelectasis. Patchy nodular ground-glass airspace disease is seen within the left upper lobe, likely inflammatory or infectious. No pneumothorax. 5 mm right upper lobe pulmonary nodule is identified on image 44/5, indeterminate in this patient with a history of colon cancer. Close attention on follow-up is recommended. No other solid pulmonary nodules are identified. Musculoskeletal: There are no acute or  destructive bony lesions. Reconstructed images demonstrate no additional findings. CT ABDOMEN PELVIS FINDINGS Hepatobiliary: The liver is unremarkable without focal abnormality identified on this single phase exam. No biliary duct dilation. The gallbladder is decompressed, with calcified gallstones identified. No evidence of acute cholecystitis. Pancreas: Unremarkable. No pancreatic ductal dilatation or surrounding inflammatory changes. Spleen: Normal in size without focal abnormality. Adrenals/Urinary Tract: There are bilateral nonobstructing renal calculi. There are at least 4 distinct calculi on the right, largest measuring 18 mm. At least 3 distinct calculi are seen on the left, largest measuring 9 mm. There are simple appearing right renal cysts which do not require specific imaging follow-up. There is a small amount of gas within the bladder lumen, as well as within a midpole calyx on the right. This could reflect gas introduced during catheterization, though correlation with urinalysis is recommended to exclude superimposed infection. Bladder is decompressed, with nonspecific wall thickening. The adrenals are unremarkable. Stomach/Bowel: Long segment annular mass of the mid sigmoid colon is identified, extending at least 9 cm in length reference image 107/3, corresponding to the reported colonoscopy findings. No pericolonic fat stranding in this region. There is no bowel obstruction or ileus. Diverticulosis of the distal colon without diverticulitis. Normal appendix right lower quadrant. Vascular/Lymphatic: Atherosclerosis of the aorta and its branches. There are no pathologic lymph nodes identified within the abdomen or pelvis. Numerous subcentimeter lymph nodes within the retroperitoneum are stable and nonspecific. Reproductive: Prostate is unremarkable. Other: No free fluid or free intraperitoneal gas. Fat containing umbilical hernia unchanged. No bowel herniation. Musculoskeletal: No acute or destructive  bony lesions. Reconstructed images demonstrate no additional findings. IMPRESSION: 1. Long segment annular mass of the sigmoid colon compatible with neoplasm  identified at colonoscopy. 2. Solitary right upper lobe 5 mm pulmonary nodule, nonspecific. This could be an atypical presentation for a solitary metastasis. Continued attention on follow-up is recommended. 3. Otherwise no evidence of intrathoracic, intra-abdominal, or intrapelvic metastases. 4. Dense areas of consolidation within the bilateral lower lobes, left greater than right, as well as patchy nodular airspace disease within the left upper lobe. Findings likely represent a combination of infection/inflammation and atelectasis. 5. Trace bilateral pleural effusions, left greater than right. 6. Cardiomegaly. 7. Colonic diverticulosis without diverticulitis. 8. Bilateral nonobstructing renal calculi. 9. Small amount of gas within the bladder lumen and a midpole right renal calyx, likely representing gas introduced during catheterization. Please correlate with urinalysis to exclude superimposed infection. 10.  Aortic Atherosclerosis (ICD10-I70.0). Electronically Signed   By: Sharlet Salina M.D.   On: 03/29/2023 15:52   US Abdomen Limited RUQ (LIVER/GB)  Result Date: 03/27/2023 CLINICAL DATA:  Elevated LFTs EXAM: ULTRASOUND ABDOMEN LIMITED RIGHT UPPER QUADRANT COMPARISON:  CT abdomen and pelvis with contrast September 21, 2015 FINDINGS: Gallbladder: Cholelithiasis, measuring up to 7 mm. No gallbladder wall thickening or pericholecystic fluid. No sonographic Murphy sign noted by sonographer. Common bile duct: Diameter: 5 mm Liver: No focal lesion identified. Increased hepatic parenchymal echogenicity. Portal vein is patent on color Doppler imaging with normal direction of blood flow towards the liver. Other: None. IMPRESSION: 1. Cholelithiasis without sonographic evidence of acute cholecystitis. 2. Increased hepatic parenchymal echogenicity, which is  nonspecific, but most commonly seen in the setting of hepatic steatosis. Electronically Signed   By: Jacob Moores M.D.   On: 03/27/2023 08:11   DG CHEST PORT 1 VIEW  Result Date: 03/26/2023 CLINICAL DATA:  Cough EXAM: PORTABLE CHEST 1 VIEW COMPARISON:  X-ray 07/19/2019 and older FINDINGS: Film is rotated to the left. Enlarged cardiopericardial silhouette with sternal wires. Calcified aorta. Atrial occlusion clip. Vascular congestion. Small left effusion with some opacity. No pneumothorax. Film is under penetrated. IMPRESSION: Postop chest. Enlarged heart with vascular congestion. Atrial occlusion clip. Small left effusion with adjacent opacity. Infiltrates not excluded. Recommend follow-up. Under penetrated and rotated radiograph Electronically Signed   By: Karen Kays M.D.   On: 03/26/2023 14:17       Discharge Exam: Vitals:   04/08/23 0545 04/08/23 1541  BP: 128/82 114/78  Pulse: 85 (!) 108  Resp: 18 (!) 25  Temp: (!) 97.5 F (36.4 C) 98.3 F (36.8 C)  SpO2: 97% 95%   Vitals:   04/08/23 0333 04/08/23 0545 04/08/23 0600 04/08/23 1541  BP:  128/82  114/78  Pulse:  85  (!) 108  Resp:  18  (!) 25  Temp:  (!) 97.5 F (36.4 C)  98.3 F (36.8 C)  TempSrc:  Oral  Oral  SpO2: 100% 97%  95%  Weight:   127.1 kg   Height:        General: Pt is alert, awake, not in acute distress,very deconditioned,weak,obese Cardiovascular: RRR, S1/S2 +, no rubs, no gallops Respiratory: diminished air sounds bilaterally Abdominal: Soft, NT, distended, bowel sounds + Extremities: LE edema, no cyanosis    The results of significant diagnostics from this hospitalization (including imaging, microbiology, ancillary and laboratory) are listed below for reference.     Microbiology: No results found for this or any previous visit (from the past 240 hour(s)).   Labs: BNP (last 3 results) Recent Labs    04/07/23 0404  BNP 545.4*   Basic Metabolic Panel: Recent Labs  Lab 04/02/23 0734  04/03/23 0933 04/04/23  4098 04/05/23 0433 04/06/23 0442 04/07/23 0404 04/08/23 0836  NA 136   < > 136 137 138 139 136  K 3.2*   < > 3.6 4.0 3.1* 3.8 2.5*  CL 101   < > 100 102 100 102 97*  CO2 21*   < > 24 23 26 25 29   GLUCOSE 127*   < > 180* 168* 174* 185* 128*  BUN 19   < > 29* 33* 39* 46* 40*  CREATININE 1.01   < > 1.21 1.13 1.26* 1.23 1.06  CALCIUM 8.2*   < > 8.3* 8.5* 8.4* 8.8* 8.6*  MG 2.1  --   --   --   --   --   --   PHOS 3.9  --   --   --   --   --   --    < > = values in this interval not displayed.   Liver Function Tests: Recent Labs  Lab 04/02/23 0734  ALBUMIN 2.2*   No results for input(s): "LIPASE", "AMYLASE" in the last 168 hours. No results for input(s): "AMMONIA" in the last 168 hours. CBC: Recent Labs  Lab 04/03/23 0933 04/04/23 0446 04/06/23 0442 04/07/23 0506 04/08/23 0836  WBC 11.7* 11.1* 11.7* 15.3* 15.5*  HGB 9.3* 8.8* 8.9* 9.5* 9.0*  HCT 32.7* 31.8* 31.8* 33.7* 31.7*  MCV 86.5 86.9 87.4 86.4 84.5  PLT 486* 456* 399 396 356   Cardiac Enzymes: No results for input(s): "CKTOTAL", "CKMB", "CKMBINDEX", "TROPONINI" in the last 168 hours. BNP: Invalid input(s): "POCBNP" CBG: No results for input(s): "GLUCAP" in the last 168 hours. D-Dimer No results for input(s): "DDIMER" in the last 72 hours. Hgb A1c No results for input(s): "HGBA1C" in the last 72 hours. Lipid Profile No results for input(s): "CHOL", "HDL", "LDLCALC", "TRIG", "CHOLHDL", "LDLDIRECT" in the last 72 hours. Thyroid function studies No results for input(s): "TSH", "T4TOTAL", "T3FREE", "THYROIDAB" in the last 72 hours.  Invalid input(s): "FREET3" Anemia work up No results for input(s): "VITAMINB12", "FOLATE", "FERRITIN", "TIBC", "IRON", "RETICCTPCT" in the last 72 hours. Urinalysis    Component Value Date/Time   COLORURINE YELLOW 03/26/2023 1039   APPEARANCEUR CLOUDY (A) 03/26/2023 1039   LABSPEC 1.012 03/26/2023 1039   PHURINE 5.0 03/26/2023 1039   GLUCOSEU NEGATIVE  03/26/2023 1039   HGBUR MODERATE (A) 03/26/2023 1039   BILIRUBINUR NEGATIVE 03/26/2023 1039   BILIRUBINUR n 01/23/2018 1539   KETONESUR NEGATIVE 03/26/2023 1039   PROTEINUR NEGATIVE 03/26/2023 1039   UROBILINOGEN 0.2 01/23/2018 1539   UROBILINOGEN 0.2 09/21/2015 1557   NITRITE NEGATIVE 03/26/2023 1039   LEUKOCYTESUR LARGE (A) 03/26/2023 1039   Sepsis Labs Recent Labs  Lab 04/04/23 0446 04/06/23 0442 04/07/23 0506 04/08/23 0836  WBC 11.1* 11.7* 15.3* 15.5*   Microbiology No results found for this or any previous visit (from the past 240 hour(s)).  Please note: You were cared for by a hospitalist during your hospital stay. Once you are discharged, your primary care physician will handle any further medical issues. Please note that NO REFILLS for any discharge medications will be authorized once you are discharged, as it is imperative that you return to your primary care physician (or establish a relationship with a primary care physician if you do not have one) for your post hospital discharge needs so that they can reassess your need for medications and monitor your lab values.    Time coordinating discharge: 40 minutes  SIGNED:   Burnadette Pop, MD  [facility] 04/08/2023, 4:30  PM Pager [phone]  If 7PM-7AM, please contact night-coverage [email or web address] Password [email or web address]

## 2023-04-08 NOTE — Progress Notes (Signed)
PROGRESS NOTE  Charles Mckinney  ZOX:[id number] DOB: January 30, 1951 DOA: 03/24/2023 PCP: Virl Cagey, MD   Brief Narrative: Patient is a 72 year old male from [facility] with history of hypertension, COPD, chronic hypoxic respiratory failure on 2 L of oxygen , CKD stage IIIb, atrial fibrillation on Eliquis, combined systolic/diastolic CHF, OSA, AAA who presents to the emergency department with complaint of dark blood in the stool multiple times.  He noticed dark red blood in diaper multiple times over the past week.  Takes Eliquis.  On presentation, lab work showed potassium of 2.2, hemoglobin 7.4, creatinine of 1.9 . He denies any nausea, abdomen pain or vomiting.  Blood pressure was soft on presentation.  Patient was admitted for the management of  GI bleed, GI consulted. Hospital course remarkable for persistent A-fib with RVR, hypokalemia, melanotic stools.  Colonoscopy showed large rectosigmoid partially obstructing mass ,likely malignant.  Cardiology,palliative care,  General surgery following, Oncology also consulted .  Plan was for dc to [facility] with hospice,but his respiratory status worsened last night and is currently on high flow oxygen, restarted IV Lasix.  After extensive discussion with family about goals of care, family/patient have expressed their desire for comfort care.  Plan for discharge to residential hospice   Assessment & Plan:  Acute lower GI bleed: Presented with hematochezia, dark stool.  Hemoglobin of 7.4 on presentation.  Given 2 units of blood transfusion.   Hemoglobin now stable in the range of 8-9 . On Protonix. EGD showed nonbleeding gastric ulcer with a clean ulcer base.  Colonoscopy showed large rectosigmoid partially obstructing mass .On soft diet  Rectosigmoid adenocarcinoma: Colonoscopy showed large rectosigmoid partially obstructing mass .biopsy positive for adenocarcinoma.  CT chest/abdomen/pelvis with contrast showed solitary right upper lobe 5 mm  pulmonary nodule . Oncology, general surgery, GI were following.  He is not a candidate for surgical or medical intervention.  Plan is for hospice approach  Persistent A-fib with RVR: On Eliquis at home and metoprolol for rate control.   Eliquis on hold.  Cardiology was following.  Currently on amiodarone.  Eliquis cannot be continued due to high risk for rebleeding  Dyspnea: Multifactorial secondary to COPD, CHF and also possibly obesity hypoventilation syndrome.  Continue Lasix oral until patient transitioned to full comfort care  Gram-negative bacteremia: Had mild leukocytosis, became febrile on 4/21.  Urine culture/blood cultures showing E. coli.  Treated with ceftriaxone.     AKI in CKD stage IIIa: Baseline creatinine was 1.5 ,3 years ago . Presented with creatinine of 1.9.  Most likely prerenal in the setting of blood loss.  antihypertensives on hold.  Kidney function is stable  Acute on chronic Combined systolic/diastolic CHF: Echo done on 10/2018 showed EF of 30 to 35%, diffuse hypokinesis. Has chronic edema bilateral lower extremities.  Cardiology was consulted and was following.  On oral Lasix   Hypokalemia: Being aggressively supplemented and monitored.  Will stop doing blood work after he is transition to full comfort care   OSA: On CPAP   COPD: On tapering steroids.  Uses 2L of at home.   Elevated alkaline phosphatase: Elevated GGT. liver enzymes normal.  Right upper quadrant ultrasound showed hepatic steatosis  Left upper extremity edema: Negative venous Doppler   Debility/deconditioning/obesity: Morbidly obese patient.  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  Now found to have  colorectal cancer, permanent A-fib, E. coli bacteremia.  Ambulates with the help of wheelchair.  Lives at a SNF, [facility] consulted.  Very  poor quality of life due to several comorbidities.  Perative care was following.  CODE STATUS changed to DNR.  Plan for transfer to [facility] when  possible  Nutrition Problem: Inadequate oral intake Etiology: poor appetite, nausea Pressure Injury 03/25/23 Buttocks Right Stage 2 -  Partial thickness loss of dermis presenting as a shallow open injury with a red, pink wound bed without slough. (Active)  03/25/23 1522  Location: Buttocks  Location Orientation: Right  Staging: Stage 2 -  Partial thickness loss of dermis presenting as a shallow open injury with a red, pink wound bed without slough.  Wound Description (Comments):   Present on Admission: Yes  Dressing Type Foam - Lift dressing to assess site every shift 04/08/23 0802     Pressure Injury 03/25/23 Buttocks Right Stage 2 -  Partial thickness loss of dermis presenting as a shallow open injury with a red, pink wound bed without slough. (Active)  03/25/23 1523  Location: Buttocks  Location Orientation: Right  Staging: Stage 2 -  Partial thickness loss of dermis presenting as a shallow open injury with a red, pink wound bed without slough.  Wound Description (Comments):   Present on Admission: Yes  Dressing Type None 04/04/23 0816    DVT prophylaxis:SCDs Start: 03/25/23 0408     Code Status: DNR  Family Communication:Called and discussed with son on phone on 5/2  Patient status: Inpatient  Patient is from : SNF  Anticipated discharge to: Residential hospice  Estimated DC date: 1 to 2 days  Consultants: GI, cardiology, palliative care, general surgery,oncology  Procedures: EGD/colonoscopy  Antimicrobials:  Anti-infectives (From admission, onward)    Start     Dose/Rate Route Frequency Ordered Stop   04/03/23 1000  cefTRIAXone (ROCEPHIN) 2 g in sodium chloride 0.9 % 100 mL IVPB        2 g 200 mL/hr over 30 Minutes Intravenous Every 24 hours 04/03/23 0914 04/05/23 1128   03/27/23 1000  cefTRIAXone (ROCEPHIN) 2 g in sodium chloride 0.9 % 100 mL IVPB        2 g 200 mL/hr over 30 Minutes Intravenous Every 24 hours 03/27/23 0737 04/02/23 1034   03/26/23 1600   cefTRIAXone (ROCEPHIN) 2 g in sodium chloride 0.9 % 100 mL IVPB  Status:  Discontinued        2 g 200 mL/hr over 30 Minutes Intravenous Every 24 hours 03/26/23 1510 03/27/23 0737   03/26/23 1600  azithromycin (ZITHROMAX) 500 mg in sodium chloride 0.9 % 250 mL IVPB  Status:  Discontinued        500 mg 250 mL/hr over 60 Minutes Intravenous Every 24 hours 03/26/23 1510 03/27/23 0736       Subjective: Patient seen and examined at bedside today.  Hemodynamically stable.  He was complaining of dyspnea as usual. On 5 to 6 L of oxygen.  He says he wants to be transferred to [facility]   Objective: Vitals:   04/07/23 2000 04/08/23 0333 04/08/23 0545 04/08/23 0600  BP:   128/82   Pulse:   85   Resp:   18   Temp:   (!) 97.5 F (36.4 C)   TempSrc:   Oral   SpO2: 95% 100% 97%   Weight:    127.1 kg  Height:        Intake/Output Summary (Last 24 hours) at 04/08/2023 1131 Last data filed at 04/08/2023 0930 Gross per 24 hour  Intake 1800 ml  Output 2600 ml  Net -800 ml  Filed Weights   04/06/23 0600 04/07/23 0500 04/08/23 0600  Weight: 129.1 kg 125.6 kg 127.1 kg    Examination:     General exam: Very deconditioned, weak, morbidly obese, lying in bed HEENT: PERRL Respiratory system: Diminished sounds bilaterally Cardiovascular system: Irregular regular rhythm.  Gastrointestinal system: Abdomen is distended, soft and nontender. Central nervous system: Alert and oriented Extremities: Bilateral lower edema, no clubbing ,no cyanosis Skin: Pressure ulcer  Data Reviewed: I have personally reviewed following labs and imaging studies  CBC: Recent Labs  Lab 04/03/23 0933 04/04/23 0446 04/06/23 0442 04/07/23 0506 04/08/23 0836  WBC 11.7* 11.1* 11.7* 15.3* 15.5*  HGB 9.3* 8.8* 8.9* 9.5* 9.0*  HCT 32.7* 31.8* 31.8* 33.7* 31.7*  MCV 86.5 86.9 87.4 86.4 84.5  PLT 486* 456* 399 396 356   Basic Metabolic Panel: Recent Labs  Lab 04/02/23 0734 04/03/23 0933 04/04/23 0446  04/05/23 0433 04/06/23 0442 04/07/23 0404 04/08/23 0836  NA 136   < > 136 137 138 139 136  K 3.2*   < > 3.6 4.0 3.1* 3.8 2.5*  CL 101   < > 100 102 100 102 97*  CO2 21*   < > 24 23 26 25 29   GLUCOSE 127*   < > 180* 168* 174* 185* 128*  BUN 19   < > 29* 33* 39* 46* 40*  CREATININE 1.01   < > 1.21 1.13 1.26* 1.23 1.06  CALCIUM 8.2*   < > 8.3* 8.5* 8.4* 8.8* 8.6*  MG 2.1  --   --   --   --   --   --   PHOS 3.9  --   --   --   --   --   --    < > = values in this interval not displayed.     No results found for this or any previous visit (from the past 240 hour(s)).    Radiology Studies: DG Chest Port 1 View  Result Date: 04/07/2023 CLINICAL DATA:  Shortness of breath EXAM: PORTABLE CHEST 1 VIEW COMPARISON:  04/02/2023, 03/26/2023 FINDINGS: Post sternotomy changes. Left atrial appendage clip. Incompletely included left lung base. Cardiomegaly with left effusion and airspace disease at left base. Vascular congestion IMPRESSION: Incompletely included left lung base. Cardiomegaly with vascular congestion left effusion and airspace disease at the left base which may be due to atelectasis or pneumonia. Findings do not appear significantly changed. Electronically Signed   By: Jasmine Pang M.D.   On: 04/07/2023 02:08    Scheduled Meds:  amiodarone  200 mg Oral Daily   Chlorhexidine Gluconate Cloth  6 each Topical Daily   feeding supplement  237 mL Oral BID BM   furosemide  80 mg Intravenous Q12H   ipratropium-albuterol  3 mL Nebulization BID   levothyroxine  100 mcg Oral Q0600   metoprolol tartrate  25 mg Oral BID   mometasone-formoterol  2 puff Inhalation BID   pantoprazole  40 mg Oral BID   potassium chloride  40 mEq Oral Once   [START ON 04/11/2023] predniSONE  20 mg Oral Q breakfast   predniSONE  40 mg Oral Q breakfast   sodium chloride flush  3 mL Intravenous Q12H   traZODone  25 mg Oral QHS   Continuous Infusions:  potassium chloride 10 mEq (04/08/23 1031)      LOS: 14 days    Burnadette Pop, MD [facility] P5/03/2023, 11:31 AM

## 2023-04-08 NOTE — Progress Notes (Signed)
Patient left facility via transport. O2 at 5L/[place]. Morphine drip stopped. Son, Minerva Areola, notified that patient is leaving hospital. Nurse receiving patient at [facility] notified that patient is leaving hospital. Update given to nurse receiving patient. IV in place, 18 in RUA saline locked. Patient had no clothing in the room. Eye glasses on patient.

## 2023-04-08 NOTE — Progress Notes (Signed)
PMT no charge note. Notified by nursing colleagues late in the evening on 04-07-2023 that the patient's son Charles Mckinney presented at the bedside and the patient and son Charles Mckinney had discussions about his CODE STATUS as well as broad goals of care and regarding disposition options.  [facility] hospice liaison's have already been following [name] during this hospitalization.  As per patient and son discussions, they have agreed to establish a CODE STATUS of DO NOT RESUSCITATE/DO NOT INTUBATE.  They would like to begin to focus more on avoidance of pain and suffering, aggressive symptom management and understand that the patient is irreversibly ill and possibly at end-of-life.  Hence, the plan is for the patient to transfer to [facility] for continuation of symptom management.  TOC consult placed.  Call placed and discussed with son Charles Mckinney in a.m. on 04-08-2023, son Charles Mckinney plan for DNR/DNI and for transfer to residential hospice. DNR/DNI order placed on the chart.  Patient will need a portable DNR signed if he is accepted to hospice facility for transfer. No charge Rosalin Hawking MD  [facility].

## 2023-04-08 NOTE — TOC Transition Note (Addendum)
Transition of Care [facility]) - CM/SW Discharge Note   Patient Details  Name: Charles Mckinney MRN: [id number] Date of Birth: 05/17/1951  [facility] CM/SW Contact:  Adrian Prows, RN Phone Number: 04/08/2023, 5:11 PM   Clinical Narrative:    Notified by Redge Gainer [phone]) w/ [facility] pt has bed at [facility]; Ree Kida says room # [id number], and call report # [phone]; pt will be transported by [facility]; pt's son Cullin Guenette [phone]) notified and agrees to d/c plan; spoke w/ Darl Pikes, operator at [facility] and she says Ree K[facility] has already set up transport w/ 7 PM pick up; no TOC needs.   Final next level of care: Hospice Medical Facility Barriers to Discharge: No Barriers Identified   Patient Goals and CMS Choice CMS Medicare.gov Compare Post Acute Care list provided to:: Patient Represenative (must comment) Choice offered to / list presented to : Patient, Adult Children  Discharge Placement                  Patient to be transferred to facility by: [facility] Name of family member notified: Jayzeon Whoolery (son) [phone] Patient and family notified of of transfer: 04/08/23  Discharge Plan and Services Additional resources added to the After Visit Summary for   In-house Referral: Chaplain, Hospice / Palliative Care Discharge Planning Services: CM Consult            DME Arranged: N/A DME Agency: NA                  Social Determinants of Health (SDOH) Interventions SDOH Screenings   Food Insecurity: No Food Insecurity (03/25/2023)  Housing: Low Risk  (03/25/2023)  Transportation Needs: No Transportation Needs (03/25/2023)  Utilities: Not At Risk (03/25/2023)  Tobacco Use: Medium Risk (04/02/2023)     Readmission Risk Interventions     No data to display

## 2023-05-06 DEATH — deceased
